# Patient Record
Sex: Male | Born: 1935
Health system: Southern US, Community
[De-identification: ages and names within clinical notes are randomized; demographics above are authoritative.]

## PROBLEM LIST (undated history)

## (undated) DIAGNOSIS — I351 Nonrheumatic aortic (valve) insufficiency: Secondary | ICD-10-CM

## (undated) DIAGNOSIS — I82409 Acute embolism and thrombosis of unspecified deep veins of unspecified lower extremity: Secondary | ICD-10-CM

## (undated) DIAGNOSIS — E785 Hyperlipidemia, unspecified: Secondary | ICD-10-CM

## (undated) DIAGNOSIS — C4490 Unspecified malignant neoplasm of skin, unspecified: Secondary | ICD-10-CM

## (undated) DIAGNOSIS — I340 Nonrheumatic mitral (valve) insufficiency: Secondary | ICD-10-CM

## (undated) DIAGNOSIS — K219 Gastro-esophageal reflux disease without esophagitis: Secondary | ICD-10-CM

## (undated) DIAGNOSIS — I5040 Unspecified combined systolic (congestive) and diastolic (congestive) heart failure: Secondary | ICD-10-CM

## (undated) DIAGNOSIS — E7800 Pure hypercholesterolemia, unspecified: Secondary | ICD-10-CM

## (undated) DIAGNOSIS — I2510 Atherosclerotic heart disease of native coronary artery without angina pectoris: Secondary | ICD-10-CM

## (undated) DIAGNOSIS — K819 Cholecystitis, unspecified: Secondary | ICD-10-CM

## (undated) HISTORY — DX: Gastro-esophageal reflux disease without esophagitis: K21.9

## (undated) HISTORY — DX: Unspecified combined systolic (congestive) and diastolic (congestive) heart failure: I50.40

## (undated) HISTORY — PX: SKIN CANCER EXCISION: SHX779

## (undated) HISTORY — DX: Pure hypercholesterolemia, unspecified: E78.00

## (undated) HISTORY — DX: Unspecified malignant neoplasm of skin, unspecified: C44.90

## (undated) HISTORY — DX: Nonrheumatic aortic (valve) insufficiency: I35.1

## (undated) HISTORY — DX: Nonrheumatic mitral (valve) insufficiency: I34.0

## (undated) HISTORY — DX: Acute embolism and thrombosis of unspecified deep veins of unspecified lower extremity: I82.409

## (undated) HISTORY — DX: Hyperlipidemia, unspecified: E78.5

## (undated) HISTORY — PX: TUMOR REMOVAL: SHX12

## (undated) HISTORY — DX: Atherosclerotic heart disease of native coronary artery without angina pectoris: I25.10

---

## 2000-02-16 DIAGNOSIS — I2510 Atherosclerotic heart disease of native coronary artery without angina pectoris: Secondary | ICD-10-CM

## 2000-02-16 HISTORY — DX: Atherosclerotic heart disease of native coronary artery without angina pectoris: I25.10

## 2000-12-19 ENCOUNTER — Inpatient Hospital Stay (HOSPITAL_COMMUNITY): Admission: RE | Admit: 2000-12-19 | Discharge: 2000-12-25 | Payer: Self-pay | Admitting: Cardiovascular Disease

## 2000-12-19 ENCOUNTER — Encounter: Payer: Self-pay | Admitting: Surgery

## 2000-12-21 ENCOUNTER — Encounter: Payer: Self-pay | Admitting: Surgery

## 2000-12-21 HISTORY — PX: CORONARY ARTERY BYPASS GRAFT: SHX141

## 2000-12-22 ENCOUNTER — Encounter: Payer: Self-pay | Admitting: Surgery

## 2000-12-23 ENCOUNTER — Encounter: Payer: Self-pay | Admitting: Surgery

## 2003-08-26 ENCOUNTER — Ambulatory Visit (HOSPITAL_COMMUNITY): Admission: RE | Admit: 2003-08-26 | Discharge: 2003-08-26 | Payer: Self-pay | Admitting: Internal Medicine

## 2008-05-10 ENCOUNTER — Encounter (INDEPENDENT_AMBULATORY_CARE_PROVIDER_SITE_OTHER): Payer: Self-pay | Admitting: *Deleted

## 2008-06-14 ENCOUNTER — Ambulatory Visit: Payer: Self-pay | Admitting: Gastroenterology

## 2008-06-27 ENCOUNTER — Ambulatory Visit: Payer: Self-pay | Admitting: Gastroenterology

## 2010-01-14 ENCOUNTER — Ambulatory Visit: Payer: Self-pay | Admitting: Cardiovascular Disease

## 2010-05-26 LAB — GLUCOSE, CAPILLARY
Glucose-Capillary: 107 mg/dL — ABNORMAL HIGH (ref 70–99)
Glucose-Capillary: 114 mg/dL — ABNORMAL HIGH (ref 70–99)

## 2010-06-16 ENCOUNTER — Other Ambulatory Visit: Payer: Self-pay | Admitting: *Deleted

## 2010-06-16 DIAGNOSIS — E7800 Pure hypercholesterolemia, unspecified: Secondary | ICD-10-CM

## 2010-07-03 NOTE — Consult Note (Signed)
[place]. [facility]  Patient:    Nathan [name], Nathan [name] Visit Number: [id number] MRN: [id number]          Service Type: SUR Location: 7LOA 7001 10 Attending Physician:  Valla Leaver Dictated by:   Gaye Pollack, M.D. Proc. Date: 12/19/00 Admit Date:  12/19/2000   CC:         Ramond Dial., M.D.  Ermalene Searing Philip Aspen, M.D.   Consultation Report  REFERRING PHYSICIAN:  Ramond Dial., M.D.  PRIMARY PHYSICIAN:  Ermalene Searing. Philip Aspen, M.D.  REASON FOR CONSULTATION:  High-grade left main and severe three-vessel coronary artery disease with positive stress test.  HISTORY OF PRESENT ILLNESS:  The patient is a 77 year old gentleman who recently presented to Dr. Greta Doom office for routine physical examination. He gave a history of recent onset of exertional dyspnea as well as mild substernal chest discomfort with heavy exertion.  These symptoms resolved quickly with rest.  He has also noticed a decreased energy level.  He underwent an electrocardiogram which reportedly showed some new changes.  He underwent an exercise Cardiolite test by Dr. Acie Fredrickson which revealed ST depression with exercise but normal Cardiolite images.  He subsequently underwent cardiac catheterization today which showed a 60 to 70% distal left main stenosis.  The LAD had proximal 60% long stenosis and a 70 to 80% mid stenosis after the first diagonal branch.  The left circumflex had 80 to 90% proximal stenosis.  The obtuse marginal had 90 to 95% stenosis.  The right coronary artery had 80% mid vessel stenosis.  There was 50 to 60% stenosis beyond the posterior descending branch before the posterolateral takeoff. Left ventricular function was normal.  REVIEW OF SYSTEMS:  Constitutional: He denies fever or chills.  He has had no recent weight change.  Eyes: Negative.  ENT: Negative.  Endocrine: He denies diabetes and hypothyroidism.  Cardiovascular: As above in History of  Present Illness.  He has had no peripheral edema.  He denies PND and orthopnea.  He has had no palpitations.  Respiratory: He denies cough or sputum production. GI: He has had no nausea or vomiting.  He denies melena and bright red blood per rectum.  There is no history of peptic ulcer disease.  GU: He denies dysuria and hematuria.  Neurological: He has had no focal weakness or numbness. He denies dizziness and syncope.  He had never had TIA or stroke. Musculoskeletal: Negative.  Psychiatric: Negative.  Allergies: None.  Skin: He has had multiple skin cancers removed.  PAST MEDICAL HISTORY:  He denies any history of diabetes, hypothyroidism, or hypertension.  He does have hypercholesterolemia.  PAST SURGICAL HISTORY:  He had a small tumor removed from his back that was benign and multiple skin cancers removed.  MEDICATIONS:  He is on a medication for hypercholesterolemia which he does not remember the name of.  FAMILY HISTORY:  Positive for coronary disease.  He had a sister who died in her 20s of heart disease, and his father died at approximately age 16 of heart disease.  SOCIAL HISTORY:  He is retired but still works part time.  He denies any history of tobacco or alcohol abuse.  He is divorced and has one daughter and lives alone.  PHYSICAL EXAMINATION:  VITAL SIGNS:  Blood pressure 146/84, heart rate 98 and regular.  Respiratory rate 16 and unlabored.  He is 6 feet tall and 175 pounds.  He is afebrile.  GENERAL:  He is a well-developed white  male in no distress.  HEENT:  Normocephalic and atraumatic.  Pupils are equal and reactive to light and accommodation.  Extraocular muscles are intact.  Throat is clear.  NECK:  Normal carotid pulses bilaterally.  There are no bruits.  There is no adenopathy or thyromegaly.  CARDIAC:  Regular rate and rhythm.  There is normal S1 and S2 with no murmur, rub, or gallop.  LUNGS:  Clear.  ABDOMEN:  Active bowel sounds.  Abdomen is  soft, flat, and nontender.  There are no palpable masses or organomegaly.  EXTREMITIES:  No peripheral edema.  Pedal pulses are palpable bilaterally.  SKIN:  Warm and dry.   There are multiple old scars on his face from removal of skin cancers.  NEUROLOGIC:  Alert and oriented x 3.  Motor and sensory exams are grossly normal.  LABORATORY DATA:  Carotid Doppler examination shows no evidence of internal carotid artery stenosis.  His ABI is greater than 1 bilaterally with triphasic wave forms.  IMPRESSION:  [name] has high-grade left main and severe three-vessel coronary artery disease with anginal symptoms and a positive stress test.  I agree that proceeding with coronary artery bypass graft surgery is the best treatment.  I discussed the operative procedure with the patient including alternatives, benefits, and risks including bleeding, possible blood transfusion, infection, stroke, myocardial infarction, and death.  He understands and agrees to proceed with surgery.  He will go home today, and we will plan to bring him back on Wednesday morning for surgery. Dictated by:   Gaye Pollack, M.D. Attending Physician:  Valla Leaver DD:  12/19/00 TD:  12/20/00 Job: [id number]

## 2010-07-03 NOTE — Cardiovascular Report (Signed)
[place]. [facility]  Patient:    Nathan [name], Nathan [name] Visit Number: [id number] MRN: [id number]          Service Type: SUR Location: 7LOA 7001 10 Attending Physician:  Valla Leaver Dictated by:   Ramond Dial., M.D. Proc. Date: 12/19/00 Admit Date:  12/19/2000   CC:         Ermalene Searing. Philip Aspen, M.D.  Cardiac Catheterization Laboratory   Cardiac Catheterization  INDICATIONS: The patient is a 77 year old gentleman with a recent onset of chest pain. He recently had stress Cardiolite study which revealed ST segment depression as well as chest pain during the exercise portion. The Cardiolite scan was negative for localized ischemia. Given his response during the exercise portion of the test he was thought to perhaps have three-vessel coronary artery disease and was referred for a catheterization.  DESCRIPTION OF PROCEDURE: The right femoral artery was easily cannulated using the modified Seldinger technique.  HEMODYNAMICS: There was no aortic valve gradient on pullback.  ANGIOGRAPHY: The left main coronary artery has a 60% stenosis in the distal segment. The proximal left main is normal.  The left anterior descending artery has a proximal irregular 60% stenosis. The stenosis is fairly long and encompasses the entire proximal LAD. There is a 70-80% stenosis in the mid LAD just after giving off the first diagonal branch. The distal left anterior descending is fairly unremarkable.  The first diagonal vessel is a fairly large vessel and only has minor luminal irregularities.  The left circumflex artery is a moderate sized vessel. There is a proximal 80-90% stenosis. The circumflex then gives off a moderate sized obtuse marginal artery, which has a 90-95% stenosis proximally. There is a stenosis in the circumflex artery just after giving of the first obtuse marginal artery of about 80%.  The right coronary artery is large and dominant. There is an  80% mid stenosis. The posterior descending artery is unremarkable. The posterolateral segment artery has a 50-60% stenosis at its mid point.  The left subclavian artery is unremarkable. The left internal mammary artery is normal. The left vertebral artery has a 30-40% stenosis in the proximal segment.  LEFT VENTRICULOGRAM: The left ventriculogram was performed in a 30 RAO position. It reveals no segmental wall motion abnormalities. There is a trace amount of catheter-induced mitral regurgitation.  COMPLICATIONS: None.  CONCLUSIONS: Three-vessel coronary artery disease. The fact that the patient had a positive treadmill, but a negative Cardiolite scan suggest that he had a matched defect. The catheterization confirms this theory. He will probably need coronary artery bypass grafting. He has well preserved left ventricular systolic function. Dictated by:   Ramond Dial., M.D. Attending Physician:  Valla Leaver DD:  12/19/00 TD:  12/20/00 Job: [id number]

## 2010-07-03 NOTE — Discharge Summary (Signed)
[place]. [facility]  Patient:    JADIEL, FARB Visit Number: [id number] MRN: [id number]          Service Type: SUR Location: 2000 2035 01 Attending Physician:  Valla Leaver Dictated by:   Argentina Donovan Glendell Docker, C.R.N.F.A. Admit Date:  12/19/2000 Discharge Date: 12/25/2000   CC:         Ermalene Searing. Philip Aspen, M.D.  Ramond Dial., M.D.   Discharge Summary  ADMITTING DIAGNOSIS:  Coronary artery disease with anginal symptoms and positive stress test.  PAST MEDICAL HISTORY:  Hypercholesterolemia.  ALLERGIES:  The patient has no known drug allergies.  DISCHARGE DIAGNOSES: 1. Severe three-vessel coronary artery disease, status post coronary artery    bypass grafting. 2. Diabetes mellitus, type 2.  BRIEF HISTORY:  Mr. Propson is a 77 year old Caucasian man who presented to his primary care physician with a history of dyspnea on exertion, mild substernal chest pain with heavy exertion, and a decreased energy level.  An EKG taken at that time revealed new changes.  An exercise Cardiolite stress test done by Dr. Acie Fredrickson in his office revealed ST segment depression, and normal images. He recommended cardiac catheterization which was performed on November 4. This revealed severe three-vessel coronary artery disease including high grade left main stenosis.  Cardiac surgery consult was obtained with Dr. Gilford Raid.  After examination of the patient and review of the records, including catheterization films, he recommended coronary artery bypass grafting per treatment choice of this gentleman.  The procedure and risks and benefits were explained to the patient and he agreed to proceed.  Plans were made for him to return to the hospital on November 6.  HOSPITAL COURSE:  On December 21, 2000, Mr. Crisp was electively admitted to [facility] under the care of Dr. Gaye Pollack.  He underwent an uncomplicated coronary artery bypass grafting x5.   Grafts placed at the time of procedure were left internal mammary artery grafted to the left anterior descending artery, saphenous vein was grafted to the diagonal artery, saphenous vein was grafted to the first obtuse marginal, saphenous vein was grafted in a sequential fashion to the posterior descending and posterior lateral arteries.  Vein harvest vein for the bypass graft was harvested from bilateral lower extremities.  He tolerated the procedure well and was transferred in stable condition to the SICU.  Mr. Eimers remained hemodynamically stable throughout his postoperative course. He was noted to be hyperglycemic during this period and hemoglobin A1c taken did measure 6.4.  Mr. Hoofnagle made very good progress in recovering from his surgery and he was discharged home on December 25, 2000.  CONDITION ON DISCHARGE:  Improved.  INSTRUCTIONS ON DISCHARGE:  Medications, activity, diet, wound care, and followup appointments.  Please see the discharge instruction sheet for details.  MEDICATIONS ON DISCHARGE: 1. Tylox 1-2 p.o. q.4-6h. p.r.n. for pain. 2. Toprol XL 25 mg p.o. q.d.  He has been instructed to resume his home medications of enteric coated aspirin 325 mg p.o. q.d. and Lipitor at his home dose.  FOLLOWUP:  He has been asked to get an appointment to see Dr. Acie Fredrickson in his office in approximately two weeks.  He also has an appointment to see Dr. Cyndia Bent at the [place] office on November 26, at 9:30 in the morning, and he has been asked to get a chest x-ray at [facility] at 8:30 that morning. Dictated by:   Argentina Donovan. Glendell Docker, C.R.N.F.A. Attending Physician:  Valla Leaver DD:  02/16/01 TD:  02/16/01 Job: [id number]

## 2010-07-03 NOTE — Op Note (Signed)
[place]. [facility]  Patient:    GENNARO, SHANAFELT Visit Number: [id number] MRN: [id number]          Service Type: SUR Location: 2000 2035 01 Attending Physician:  Valla Leaver Proc. Date: 12/21/00 Admit Date:  12/19/2000   CC:         CVTS Office  Ramond Dial., M.D.  Cardiac Cath Lab   Operative Report  PREOPERATIVE DIAGNOSIS:  Left main and severe three-vessel coronary artery disease.  POSTOPERATIVE DIAGNOSIS:  Left main and severe three-vessel coronary artery disease.  OPERATIONS PERFORMED: 1. Median sternotomy. 2. Extracorporeal circulation. 3. Coronary artery bypass graft surgery times five using the left internal    mammary artery graft to the left anterior descending coronary artery with    a saphenous vein graft to the diagonal branch of the left anterior    descending, a sequential saphenous vein graft to the posterior descending    and posterolateral branch of the right coronary artery, and a saphenous    vein graft to the obtuse marginal branch of the left circumflex coronary    artery.  SURGEON:  Gaye Pollack, M.D.  ASSISTANT:  Lynnell Catalan, P.A.  ANESTHESIA:  General endotracheal.  CLINICAL HISTORY:  This patient is a 77 year old gentleman who presented with recent onset exertional dyspnea and mild chest discomfort.  He was noted to have an abnormal electrocardiogram.  An exercise stress test showed an ST depression, but normal Cardiolite images.  Cardiac catheterization showed 60-70% distal left main stenosis.  The LAD had proximal 60% long stenosis and 70-80% mid stenosis after the first diagonal branch.  The diagonal branch was a large vessel.  The left circumflex had an 80-90% proximal stenosis and a 90-95% obtuse marginal stenosis.  There was an 80% left circumflex stenosis after the first marginal branch.  The distal portion of the vessel was relatively small.  The right coronary artery had 80% mid vessel  stenosis. There was a 50-60% stenosis after the takeoff of the posterior descending branch compromising the posterolateral branch.  Left ventricular function was normal.  After review of the angiograms and examination of the patient it was felt that coronary artery bypass graft surgery was the best treatment.  I discussed the operative procedure with the patient including alternatives, benefits and risks including bleeding, possible blood transfusion, infection, stroke, myocardial infarction, and death.  He understood and agreed to proceed.  OPERATIVE PROCEDURE:  The patient was brought to the operating room and placed on the table in the supine position.  After induction of general endotracheal anesthesia a Foley catheter was placed in the bladder using sterile technique.  Then the chest, abdomen and both lower extremities were prepped and draped in the usual sterile manner.  The chest was entered through a median sternotomy incision and the pericardium opened in the midline.  Examination of the heart showed good ventricular contractility.  The ascending aorta had no palpable plaques in it.  Then the left internal mammary artery was harvested from the chest as a pedicle graft.  This was a medium caliber vessel with excellent blood flow through it.  At the same time a segment of greater saphenous vein was harvested from the right lower leg and this vein was of medium size and good quality.  However, after approximately one scissor length of the vein it divided into two small branches and therefore another section of saphenous vein was harvested from the left lower leg.  This vein was  of medium caliber and good quality.  Then the patient was heparinized.  When an adequate clotting time was achieved the distal ascending aorta was cannulated using a 20 French aortic cannula for arterial inflow.  Venous outflow was achieved using a two-stage venous cannula for the right atrial appendage.   An antegrade cardioplegia and vent cannulae were inserted in the aortic root.  Patient was placed on cardiopulmonary bypass and the distal coronaries identified.  The LAD was a large graftable vessel with mild distal disease in it.  The diagonal branch was a large vessel with proximal and midportion disease.  The obtuse marginal was a large graftable vessel that was heavily diseased proximally.  The right coronary artery gave off a large posterior descending branch that had no significant disease in it.  The posterolateral branch was small, but felt to be graftable.  There was heavy disease between the posterior descending and posterolateral branch.  Then the aorta was crossclamped and 500 cc of cold blood antegrade cardioplegia was administered in the aortic root with quick arrest of the heart.  Systemic hypothermia to 20 degrees centigrade and topical hypothermia with iced saline was used.  A temperature probe was placed the septum and inflated patent pericardium.  The first distal anastomosis was performed to the obtuse marginal branch.  The internal diameter was about 2 mm.  Conduit used was a segment of greater saphenous vein.  The anastomosis was performed in an end-to-side manner using continuous 7-0 Prolene suture.  Flow was noted through the graft and it was excellent.  Then the second distal anastomosis was performed to the diagonal branch.  The internal diameter of this vessel was 1.75 mm.  The conduit used was a second segment of the greater saphenous vein.  Anastomosis was performed in an end-to-side manner using continued 7-0 Prolene suture.  Flow was noted through the graft and was excellent.  Then a dose of cardioplegia was given on the veins grafts and in the aortic root.  The third distal anastomosis was performed to the posterior descending branch.   The internal diameter was 1.75 mm.  The conduit used was a third segment of the greater saphenous vein.  The  anastomosis was performed in a sequential side-to-side manner using continuous 7-0 Prolene suture.  Flow was noted through the graft and was excellent.  The fourth distal anastomosis was performed to the posterolateral branch.  The internal diameter of this vessel was about 1.4 mm.  The conduit used was the same segment of the greater saphenous vein.  Anastomosis was performed in a sequential end-to-side manner using continuous 8-0 Prolene suture.  Flow was noticed in the graft and was excellent.  Then another dose of cardioplegia was given down the vein grafts and in the aortic root.  The fifth distal anastomosis was performed to the midportion of the left anterior descending coronary artery.  The internal diameter was greater than 2.5 mm.  The conduit used was a left internal mammary graft and this was brought through an opening in the left pericardium and through the phrenic nerve.  It was anastomosed to the LAD in an end-to-side manner using continuous 8-0 Prolene suture.  The pedicle was tacked to the epicardium with 6-0 Prolene sutures.  The patient was rewarmed to 37 degrees centigrade and the clamp removed from the main right pedicle.  There was rapid warming in the ventricular septum and return of spontaneous ventricular fibrillation.  The crossclamp was removed with a time of 78  minutes and the patient spontaneously converted to sinus rhythm.  A partial occlusion clamp was placed in the aortic root and three proximal vein graft anastomoses were performed in an end-to-side manner using continuous 6-0 Prolene suture.  The clamp was removed.  The vein grafts deaired and clots removed from them.  The proximal and distal anastomoses appeared hemostatic along the graft was satisfactory.  Graft mark was placed around the proximal anastomosis.  Two temporary right ventricular and right atrial pacing wires were placed and brought out through the skin.  When the patient was rewarmed  to 37 degrees centigrade he was weaned from cardiopulmonary bypass on no inotropic agents.  Total bypass time was 133 minutes.  Cardiac function appeared excellent with a cardiac output of 6 liters a minute.  Protamine was given and the venous and aortic cannulae removed without difficulty.  Hemostasis was achieved.  Three chest tubes were placed with two in the posterior pericardium, one in the left pleural space and one in the anterior mediastinum.  The pericardium was reapproximated to the heart.  The sternum was closed with #6 stainless steel wires.  The fascia was closed with a continuous #1 Vicryl suture.  Subcutaneous tissue was closed with a continuous 2-0 Vicryl and the skin with 3-0 Vicryl subcuticular closure.  Lower extremity vein harvest site was closed in layers in a similar manner.  The sponge, needle and instrument counts were correct according to the scrub nurse.  Dry sterile dressings were applied over the incisions, around the chest tubes, which had Pleur-Evac suction.  The patient remained hemodynamically stable and was transported to the SICU in guarded, but stable condition. Attending Physician:  Valla Leaver DD:  12/21/00 TD:  12/22/00 Job: [id number]

## 2010-07-03 NOTE — H&P (Signed)
[place]. [facility]  Patient:    Nathan [name], Nathan [name] Visit Number: [id number] MRN: [id number]          Service Type: Attending:  Ramond Dial., M.D. Dictated by:   Ramond Dial., M.D. Adm. Date:  12/16/00   CC:         Ermalene Searing. Philip Aspen, M.D.   History and Physical  HISTORY OF PRESENT ILLNESS:  Nathan [name] is a 77 year old gentleman with a history of chest pain and chest tightness.  He was originally referred to the office for a stress Cardiolite study.  He was found to have an abnormal Cardiolite study and was scheduled for a heart catheterization.  Nathan [name] is relatively healthy.  He has had episodes of chest pain for the past several months.  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  These episodes of chest pain and chest tightness occur with minimal exercise.  They typically resolve when he stops and rests.  He has significant chest tightness and severe shortness of breath.  CURRENT MEDICATIONS:  Aspirin once a day and multivitamin once a day.  ALLERGIES:  No known drug allergies.  PAST MEDICAL HISTORY:  Hypercholesterolemia.  SOCIAL HISTORY:  The patient works as a Psychologist, sport and exercise.  He does not smoke or drink alcohol.  He drinks one or two cups of coffee a day.  FAMILY HISTORY:  His father died at age 26 due to a myocardial infarction. His mother died at age 76 due to unknown causes.  REVIEW OF SYSTEMS:  He denies any heat or cold intolerance, or weight gain or weight loss.  He denies any problems with his eyes, ears, nose, and throat. He denies any cough or sputum production.  He does have shortness of breath and some chest tightness as noted above.  He denies any palpitations, syncope, or presyncope.  He denies any change in his bowel habits or dysuria or hematuria.  He denies any rash or skin nodules.  He denies any easy bruisability or depression.  His review of systems was reviewed  and is otherwise negative.  PHYSICAL EXAMINATION:  GENERAL:  He is a middle-aged gentleman in no acute distress.  He is alert and oriented x3 and his mood and affect are normal.  VITAL SIGNS:  Heart rate 70, blood pressure 120/74.  HEENT:  Reveals 2+ carotids.  He has no bruits.  There is no JVD.  LUNGS:  Clear to auscultation.  HEART:  Regular rate, S1, S2 with no murmurs, gallops, or rubs.  ABDOMEN:  Reveals good bowel sounds and is nontender.  EXTREMITIES:  He has no clubbing, cyanosis, or edema and his pulses are intact.  NEUROLOGIC:  Nonfocal with cranial nerves II-XII intact and his motor and sensory function are intact.  His gait is normal.  His skin is warm and dry.  LABORATORY DATA:  EKG reveals normal sinus rhythm.  He has nonspecific ST and T wave changes.  ASSESSMENT AND PLAN:  Mr. Umemoto presents with episodes of chest pain and an abnormal Cardiolite study with moderately reduced left ventricular systolic function.  We will admit him for heart catheterization.  We have discussed the risks, benefits, and options concerning heart catheterization.  He understands and agrees to proceed. Dictated by:   Ramond Dial., M.D. Attending:  Ramond Dial., M.D. DD:  05/23/01 TD:  05/23/01 Job: [id number]

## 2010-07-15 ENCOUNTER — Encounter: Payer: Self-pay | Admitting: *Deleted

## 2010-07-15 DIAGNOSIS — E7800 Pure hypercholesterolemia, unspecified: Secondary | ICD-10-CM | POA: Insufficient documentation

## 2010-07-15 DIAGNOSIS — I340 Nonrheumatic mitral (valve) insufficiency: Secondary | ICD-10-CM | POA: Insufficient documentation

## 2010-07-15 DIAGNOSIS — I351 Nonrheumatic aortic (valve) insufficiency: Secondary | ICD-10-CM | POA: Insufficient documentation

## 2010-07-15 DIAGNOSIS — I2510 Atherosclerotic heart disease of native coronary artery without angina pectoris: Secondary | ICD-10-CM | POA: Insufficient documentation

## 2010-07-16 ENCOUNTER — Ambulatory Visit (INDEPENDENT_AMBULATORY_CARE_PROVIDER_SITE_OTHER): Payer: Medicare Other | Admitting: Cardiovascular Disease

## 2010-07-16 ENCOUNTER — Other Ambulatory Visit (INDEPENDENT_AMBULATORY_CARE_PROVIDER_SITE_OTHER): Payer: Medicare Other | Admitting: *Deleted

## 2010-07-16 ENCOUNTER — Encounter: Payer: Self-pay | Admitting: Cardiovascular Disease

## 2010-07-16 VITALS — BP 152/68 | HR 60 | Wt 178.0 lb

## 2010-07-16 DIAGNOSIS — I2510 Atherosclerotic heart disease of native coronary artery without angina pectoris: Secondary | ICD-10-CM

## 2010-07-16 DIAGNOSIS — E7800 Pure hypercholesterolemia, unspecified: Secondary | ICD-10-CM

## 2010-07-16 LAB — BASIC METABOLIC PANEL
Calcium: 9.8 mg/dL (ref 8.4–10.5)
Creatinine, Ser: 1.2 mg/dL (ref 0.4–1.5)

## 2010-07-16 LAB — HEPATIC FUNCTION PANEL
ALT: 24 U/L (ref 0–53)
AST: 24 U/L (ref 0–37)
Albumin: 4 g/dL (ref 3.5–5.2)
Alkaline Phosphatase: 50 U/L (ref 39–117)
Total Protein: 7 g/dL (ref 6.0–8.3)

## 2010-07-16 LAB — LIPID PANEL
Cholesterol: 142 mg/dL (ref 0–200)
Triglycerides: 174 mg/dL — ABNORMAL HIGH (ref 0.0–149.0)

## 2010-07-16 NOTE — Progress Notes (Signed)
Nathan [name] Date of Birth  June 07, 1933 [facility] / [facility] [id number] [address]     [place], [place]  [address] [phone]  Fax  [phone]  History of Present Illness:  No major complaints.  BP is a bit high today.  Ate some ham over the weekend.  BP has been normal on other recent MD visits.  Current Outpatient Prescriptions on File Prior to Visit  Medication Sig Dispense Refill  . aspirin 81 MG tablet Take 81 mg by mouth daily.        [name] atenolol (TENORMIN) 50 MG tablet Take 50 mg by mouth daily.        [name] glipiZIDE (GLUCOTROL) 10 MG tablet Take 10 mg by mouth 2 (two) times daily before a meal.        . lisinopril (PRINIVIL,ZESTRIL) 2.5 MG tablet Take 2.5 mg by mouth daily.        . metFORMIN (GLUCOPHAGE) 500 MG tablet Take 1,500 mg by mouth 2 (two) times daily with a meal.        . multivitamin (THERAGRAN) per tablet Take 1 tablet by mouth daily.        . Omega-3 Fatty Acids (FISH OIL) 1200 MG CAPS Take 1 capsule by mouth daily.        . simvastatin (ZOCOR) 20 MG tablet Take 20 mg by mouth at bedtime.        [name] DISCONTD: omeprazole (PRILOSEC OTC) 20 MG tablet Take 20 mg by mouth daily.          No Known Allergies  Past Medical History  Diagnosis Date  . Hyperlipidemia   . Coronary artery disease     CABG x5  . Diabetes mellitus   . Hypercholesteremia   . Mitral valve regurgitation   . Aortic insufficiency   . Skin cancer     tumor removed off of his back  . Acid reflux     Past Surgical History  Procedure Date  . Coronary artery bypass graft 12/21/2000    x5  . Tumor removal     off of back, skin cancer    History  Smoking status  . Never Smoker   Smokeless tobacco  . Not on file    History  Alcohol Use No    Family History  Problem Relation Age of Onset  . Heart attack Father   . Hypertension Mother   . Diabetes Brother   . Cancer Sister     breast  . Cancer Brother     throat & mouth    Reviw of Systems:    Reviewed in the HPI.  All other systems are negative.  Physical Exam: BP 152/68  Pulse 60  Wt 178 lb (80.74 kg) The patient is alert and oriented x 3.  The mood and affect are normal.  The skin is warm and dry.  Color is normal.  The HEENT exam reveals that the sclera are nonicteric.  The mucous membranes are moist.  The carotids are 2+ without bruits.  There is no thyromegaly.  There is no JVD.  The lungs are clear.  The chest wall is non tender.  The heart exam reveals a regular rate with a normal S1 and S2.  There is a soft systolic murmur.  The PMI is not displaced.   Abdominal exam reveals good bowel sounds.  There is no guarding or rebound.  There is no hepatosplenomegaly or tenderness.  There are no masses.  Exam of the legs reveal no clubbing, cyanosis, or edema.  The legs are without rashes.  The distal pulses are intact.  Cranial nerves II - XII are intact.  Motor and sensory functions are intact.  The gait is normal.  Assessment / Plan:

## 2010-07-16 NOTE — Assessment & Plan Note (Signed)
Nathan [name] is doing very well from a cardiac standpoint. I'm glad that he's not had any further episodes of chest pain.

## 2010-07-16 NOTE — Assessment & Plan Note (Signed)
We have checked a lipid profile today. I'll see him again in 6 months for office visit, lipid profile, hepatic profile, and basic metabolic profile.

## 2010-07-20 NOTE — Progress Notes (Signed)
Normal lab result msg left

## 2010-07-27 DIAGNOSIS — M79609 Pain in unspecified limb: Secondary | ICD-10-CM

## 2010-08-26 ENCOUNTER — Inpatient Hospital Stay (HOSPITAL_COMMUNITY)
Admission: AD | Admit: 2010-08-26 | Discharge: 2010-08-31 | DRG: 301 | Disposition: A | Discharge status: Home / Self Care | Payer: Medicare Other | Source: Ambulatory Visit | Attending: Internal Medicine | Admitting: Internal Medicine

## 2010-08-26 DIAGNOSIS — N529 Male erectile dysfunction, unspecified: Secondary | ICD-10-CM | POA: Diagnosis present

## 2010-08-26 DIAGNOSIS — I824Z9 Acute embolism and thrombosis of unspecified deep veins of unspecified distal lower extremity: Principal | ICD-10-CM | POA: Diagnosis present

## 2010-08-26 DIAGNOSIS — Z951 Presence of aortocoronary bypass graft: Secondary | ICD-10-CM

## 2010-08-26 DIAGNOSIS — I2510 Atherosclerotic heart disease of native coronary artery without angina pectoris: Secondary | ICD-10-CM | POA: Diagnosis present

## 2010-08-26 DIAGNOSIS — Z860100 Personal history of colon polyps, unspecified: Secondary | ICD-10-CM

## 2010-08-26 DIAGNOSIS — E785 Hyperlipidemia, unspecified: Secondary | ICD-10-CM | POA: Diagnosis present

## 2010-08-26 DIAGNOSIS — I080 Rheumatic disorders of both mitral and aortic valves: Secondary | ICD-10-CM | POA: Diagnosis present

## 2010-08-26 DIAGNOSIS — I10 Essential (primary) hypertension: Secondary | ICD-10-CM | POA: Diagnosis present

## 2010-08-26 DIAGNOSIS — Z7901 Long term (current) use of anticoagulants: Secondary | ICD-10-CM

## 2010-08-26 DIAGNOSIS — E119 Type 2 diabetes mellitus without complications: Secondary | ICD-10-CM | POA: Diagnosis present

## 2010-08-26 DIAGNOSIS — K219 Gastro-esophageal reflux disease without esophagitis: Secondary | ICD-10-CM | POA: Diagnosis present

## 2010-08-26 DIAGNOSIS — Z7982 Long term (current) use of aspirin: Secondary | ICD-10-CM

## 2010-08-26 DIAGNOSIS — M1710 Unilateral primary osteoarthritis, unspecified knee: Secondary | ICD-10-CM | POA: Diagnosis present

## 2010-08-26 LAB — COMPREHENSIVE METABOLIC PANEL
ALT: 13 U/L (ref 0–53)
AST: 8 U/L (ref 0–37)
Albumin: 3.7 g/dL (ref 3.5–5.2)
CO2: 23 mEq/L (ref 19–32)
Calcium: 9.2 mg/dL (ref 8.4–10.5)
GFR calc non Af Amer: 58 mL/min — ABNORMAL LOW (ref >60)
Sodium: 140 mEq/L (ref 135–145)
Total Protein: 7.1 g/dL (ref 6.0–8.3)

## 2010-08-26 LAB — PROTIME-INR: Prothrombin Time: 14.2 seconds (ref 11.6–15.2)

## 2010-08-26 LAB — CBC
MCH: 31.2 pg (ref 26.0–34.0)
Platelets: 220 10*3/uL (ref 150–400)
RBC: 4.77 MIL/uL (ref 4.22–5.81)
RDW: 12.6 % (ref 11.5–15.5)
WBC: 12.2 10*3/uL — ABNORMAL HIGH (ref 4.0–10.5)

## 2010-08-27 LAB — CBC
HCT: 36.1 % — ABNORMAL LOW (ref 39.0–52.0)
Hemoglobin: 13.2 g/dL (ref 13.0–17.0)
MCH: 31 pg (ref 26.0–34.0)
MCV: 84.7 fL (ref 78.0–100.0)
RBC: 4.26 MIL/uL (ref 4.22–5.81)
WBC: 8.7 10*3/uL (ref 4.0–10.5)

## 2010-08-27 LAB — HEPARIN LEVEL (UNFRACTIONATED)
Heparin Unfractionated: 0.45 IU/mL (ref 0.30–0.70)
Heparin Unfractionated: 0.5 IU/mL (ref 0.30–0.70)

## 2010-08-27 LAB — PROTIME-INR: INR: 1.2 (ref 0.00–1.49)

## 2010-08-28 ENCOUNTER — Inpatient Hospital Stay (HOSPITAL_COMMUNITY): Payer: Medicare Other

## 2010-08-28 LAB — PROTIME-INR
INR: 1.46 (ref 0.00–1.49)
Prothrombin Time: 18 seconds — ABNORMAL HIGH (ref 11.6–15.2)

## 2010-08-28 LAB — CBC
Hemoglobin: 14.3 g/dL (ref 13.0–17.0)
Platelets: 191 10*3/uL (ref 150–400)
RBC: 4.7 MIL/uL (ref 4.22–5.81)
WBC: 9.4 10*3/uL (ref 4.0–10.5)

## 2010-08-28 MED ORDER — IOHEXOL 300 MG/ML  SOLN
80.0000 mL | Freq: Once | INTRAMUSCULAR | Status: AC | PRN
  Administered 2010-08-28: 80 mL via INTRAVENOUS

## 2010-08-29 LAB — CBC
MCH: 30.2 pg (ref 26.0–34.0)
MCV: 85 fL (ref 78.0–100.0)
Platelets: 176 10*3/uL (ref 150–400)
RBC: 4.6 MIL/uL (ref 4.22–5.81)
RDW: 12.8 % (ref 11.5–15.5)
WBC: 9.4 10*3/uL (ref 4.0–10.5)

## 2010-08-29 LAB — PROTIME-INR: INR: 2.15 — ABNORMAL HIGH (ref 0.00–1.49)

## 2010-08-29 LAB — HEPARIN LEVEL (UNFRACTIONATED): Heparin Unfractionated: 0.64 IU/mL (ref 0.30–0.70)

## 2010-08-30 LAB — CBC
HCT: 37.7 % — ABNORMAL LOW (ref 39.0–52.0)
MCHC: 36.1 g/dL — ABNORMAL HIGH (ref 30.0–36.0)
MCV: 85.3 fL (ref 78.0–100.0)
Platelets: 174 10*3/uL (ref 150–400)
RDW: 12.7 % (ref 11.5–15.5)
WBC: 8.7 10*3/uL (ref 4.0–10.5)

## 2010-08-30 LAB — PSA: PSA: 1.27 ng/mL (ref <=4.00)

## 2010-08-30 LAB — BASIC METABOLIC PANEL
BUN: 17 mg/dL (ref 6–23)
CO2: 24 mEq/L (ref 19–32)
Calcium: 8.6 mg/dL (ref 8.4–10.5)
Chloride: 107 mEq/L (ref 96–112)
Creatinine, Ser: 1.32 mg/dL (ref 0.50–1.35)

## 2010-08-30 LAB — PROTIME-INR: INR: 2.6 — ABNORMAL HIGH (ref 0.00–1.49)

## 2010-08-31 LAB — CBC
HCT: 39.2 % (ref 39.0–52.0)
Hemoglobin: 13.9 g/dL (ref 13.0–17.0)
MCV: 86.2 fL (ref 78.0–100.0)
RBC: 4.55 MIL/uL (ref 4.22–5.81)
WBC: 11.2 10*3/uL — ABNORMAL HIGH (ref 4.0–10.5)

## 2010-08-31 LAB — GLUCOSE, CAPILLARY: Glucose-Capillary: 135 mg/dL — ABNORMAL HIGH (ref 70–99)

## 2010-08-31 LAB — PROTIME-INR
INR: 2.63 — ABNORMAL HIGH (ref 0.00–1.49)
Prothrombin Time: 28.5 seconds — ABNORMAL HIGH (ref 11.6–15.2)

## 2010-09-30 NOTE — Discharge Summary (Signed)
NAMEEMILY, KRELLER NO.:  [id number]  MEDICAL RECORD NO.:  [id number]  LOCATION:  5118                         FACILITY:  [facility]  PHYSICIAN:  Ermalene Searing. Philip Aspen, M.D.DATE OF BIRTH:  08-23-1933  DATE OF ADMISSION:  08/26/2010 DATE OF DISCHARGE:  08/31/2010                              DISCHARGE SUMMARY   CHIEF COMPLAINT:  Right thigh lump.  HISTORY OF PRESENT ILLNESS:  The patient is a 77 year old Caucasian man who about 2 weeks ago felt like he may have been stung on the right inner mid thigh.  The area was a bit sore.  He was seen by our nurse practitioner who started him on doxycycline and prednisone.  However, with this, there was not much change in his symptoms.  In fact, he had an increase in symptoms and tenderness in this area.  This was not associated with fever, chills, or shortness of breath.  He has not had any injury to the area and has not been on recent long car trips or airplane trips.  He has no personal history of thrombosis or a family history of hypercoagulation.  PAST MEDICAL HISTORY: 1. Moderate aortic insufficiency with last echocardiogram done in     March 2011, mild mitral regurgitation, mild tricuspid     regurgitation. 2. Coronary artery disease status post 2002 CABG surgery. 3. 2003 colonoscopy showed hyperplastic polyps. 4  Hypertension. 1. Diabetes mellitus, type 2. 2. Osteoarthritis. 3. Gastroesophageal reflux disease. 4. Erectile dysfunction, organic. 5. Hyperlipidemia.  MEDICATIONS AT THE TIME OF HOSPITALIZATION: 1. Aspirin 81 mg p.o. daily. 2. Glucophage extended release 500 mg, take 2 tablets p.o. twice     daily. 3. Atenolol 50 mg p.o. twice daily. 4. Cialis 20 mg p.o. daily p.r.n. 5. Prilosec 20 mg p.o. daily. 6. Glucotrol XL 10 mg p.o. twice daily. 7. Multivitamin 1 tablet p.o. daily. 8. Fish oil 1000 mg, take 2 capsules p.o. daily. 9. Amlodipine 5 mg p.o. daily. 10.Simvastatin 20 mg p.o.  daily. 11.Lisinopril 5 mg p.o. daily. 12.Meloxicam 15 mg p.o. daily p.r.n. knee pain. 13.Status post prednisone 10 mg twice daily for 3 days. 14.Status post doxycycline 100 mg p.o. twice daily.  ALLERGIES:  No known drug allergies.  PAST SURGICAL HISTORY: 1. Remote back lipoma excision. 2. Multiple basal cell carcinoma excisions. 3. November 2002, coronary artery bypass graft surgery x5. 4. March 2006, Mohs surgery on nose for basal cell carcinoma.  FAMILY HISTORY:  His father died at age 38 from myocardial infarction. His mother died at age 22.  A sister died at age 72 years from myocardial infarction and another sister had breast cancer.  A grandparent had diabetes mellitus, type 2.  There is a family history of premature coronary artery disease, breast cancer, and diabetes mellitus. He has one daughter, one grandson, and one granddaughter.  SOCIAL HISTORY:  He is divorced.  He is retired from work as a Psychologist, sport and exercise. He has no history of tobacco or alcohol abuse.  He enjoys playing golf in his free time.  REVIEW OF SYSTEMS:  He has not had recent fever or chills or weight loss.  He denies chest pain, palpitations, dyspnea on exertion, orthopnea, or  peripheral edema.  He denies shortness of breath or cough. He denies nausea, vomiting, diarrhea, constipation, or rectal bleeding. He denies difficulty voiding or significant nocturia.  He has mild erectile dysfunction improved with Cialis.  He has occasional mild knee pain and mild low back pain that improves with Mobic.  He denies numbness or tingling or anxiety or depression.  INITIAL PHYSICAL EXAMINATION:  VITAL SIGNS:  Blood pressure 134/72, pulse 84, respirations 20, temperature 98.1, weight is 178.4 pounds, and height of 71.75 inches. GENERAL:  The patient is a well-nourished, well-developed Caucasian man who is in no apparent distress. HEAD, EYES, EARS, NOSE, AND THROAT:  Within normal limits. NECK:  Supple without jugular  venous distention or carotid bruit. CHEST:  Clear to auscultation. HEART:  Regular rate and rhythm.  S1-S2 were present and there was a grade 2/6 diastolic decrescendo murmur consistent with aortic insufficiency.  He did not have a carotid bruit and the pedal pulses were normal.  There was no peripheral edema. ABDOMEN:  He had normal bowel sounds and no hepatosplenomegaly or tenderness. MUSCULOSKELETAL:  Normal. NEUROLOGIC:  No focal deficits. EXTREMITIES:  Significant for prominent varicose veins on both legs.  In the mid medial right thigh, he has prominent noncompressible venous cords extending throughout the expected course of the greater saphenous vein.  HOSPITAL COURSE:  The patient was admitted to a medical bed without telemetry.  On the day of admission, he had a right lower extremity DVT ultrasound exam.  The findings were consistent with an acute deep vein thrombosis involving the right lower extremity and extending as far proximally as the right common femoral vein.  He was started on IV heparin for this and Coumadin was added with pharmacist's assistance with dosing.  He had a PSA level checked which was normal.  He had a chest x-ray done that showed minimal left base scarring but no acute cardiopulmonary disease.  He also had a CT scan of the abdomen and pelvis done with oral and IV contrast.  This study showed bilateral peripelvic renal cysts, prostate enlargement, mild fatty infiltration of the liver, grade II spondylolisthesis of L5 on S1, and no evidence of abnormal lymphadenopathy or neoplasm.  COMPLICATIONS:  None.  CONDITION ON DISCHARGE:  He had no shortness of breath or unusual bleeding.  He had no chest discomfort.  He had been eating well.  RECENT PHYSICAL EXAMINATION:  VITAL SIGNS:  Blood pressure 156/97, pulse 63, respirations 17, temperature 97.5, and pulse oxygen saturation was 95% on room air. GENERAL:  He is a well-nourished, well-developed white man  who is in no apparent distress. CHEST:  Clear to auscultation. HEART:  He had a regular rate and rhythm with a grade 2/6 diastolic decrescendo murmur consistent with aortic insufficiency. ABDOMEN:  He had normal bowel sounds and no tenderness. EXTREMITIES:  Without edema.  There is less prominence of the noncompressible veins in the right thigh.  MOST RECENT AND PERTINENT LABORATORY STUDIES:  INR level was 2.63. White blood cell count was 11.2, hemoglobin 13.9, hematocrit 39.2, and platelets 178.  Serum sodium 140, potassium 3.7, chloride 107, carbon dioxide 24, BUN 17, creatinine 1.32, and glucose 154.  PSA was 1.27.  DISCHARGE DIAGNOSES: 1. Acute right lower extremity deep venous thrombosis. 2. Hypertension, essential, controlled. 3. Diabetes mellitus, type 2, controlled. 4. Aortic insufficiency, moderate. 5. Gastroesophageal reflux disease. 6. Hyperlipidemia. 7. Benign prostatic hypertrophy. 8. Osteoarthritis of the knee.  DISCHARGE MEDICATIONS: 1. Coumadin 5 mg, take 1/2 tablet by mouth every  Monday, Wednesday,     and Friday and a full tablet by mouth every Tuesday, Thursday,     Saturday, and Sunday. 2. Amlodipine 5 mg p.o. daily. 3. Atenolol 50 mg p.o. daily. 4. Glipizide extended release 10 mg p.o. twice daily. 5. Metformin extended release 500 mg, take 1 tablet in the morning and     2 tablets in the evenings. 6. Lisinopril 2.5 mg p.o. daily. 7. Multivitamin 1 tablet p.o. daily. 8. Omeprazole 20 mg p.o. daily. 9. Simvastatin 40 mg p.o. daily.  DISPOSITION AND FOLLOWUP:  The patient was discharged to home.  He was advised to schedule a followup appointment with Dr. Leanna Battles at [facility] within the next 3-5 days and he was given the telephone number [phone] to schedule that appointment.  He was advised to call us as soon as possible should he have any increase in symptoms in the interim.  He was advised to not play golf for 1 week following  discharge.         ______________________________ Ermalene Searing. Philip Aspen, M.D.    DGP/MEDQ  D:  09/01/2010  T:  09/01/2010  Job:  [id number]  Electronically Signed by Leanna Battles M.D. on 09/30/2010 08:42:18 AM

## 2011-01-21 ENCOUNTER — Ambulatory Visit (INDEPENDENT_AMBULATORY_CARE_PROVIDER_SITE_OTHER): Payer: Medicare Other | Admitting: Cardiovascular Disease

## 2011-01-21 ENCOUNTER — Encounter: Payer: Self-pay | Admitting: Cardiovascular Disease

## 2011-01-21 DIAGNOSIS — E785 Hyperlipidemia, unspecified: Secondary | ICD-10-CM

## 2011-01-21 DIAGNOSIS — R55 Syncope and collapse: Secondary | ICD-10-CM

## 2011-01-21 LAB — BASIC METABOLIC PANEL
BUN: 19 mg/dL (ref 6–23)
CO2: 25 mEq/L (ref 19–32)
Chloride: 102 mEq/L (ref 96–112)
Potassium: 4.3 mEq/L (ref 3.5–5.1)

## 2011-01-21 LAB — LIPID PANEL
Cholesterol: 160 mg/dL (ref 0–200)
LDL Cholesterol: 87 mg/dL (ref 0–99)
Triglycerides: 194 mg/dL — ABNORMAL HIGH (ref 0.0–149.0)

## 2011-01-21 LAB — HEPATIC FUNCTION PANEL
ALT: 30 U/L (ref 0–53)
AST: 25 U/L (ref 0–37)
Total Bilirubin: 0.8 mg/dL (ref 0.3–1.2)
Total Protein: 7 g/dL (ref 6.0–8.3)

## 2011-01-21 NOTE — Assessment & Plan Note (Signed)
Nathan [name] is doing well. He's not having episodes of chest pain. We'll continue the same medications. We'll check his lipids today and again in 6 months.

## 2011-01-21 NOTE — Patient Instructions (Addendum)
Your physician wants you to follow-up in: 6 months You will receive a reminder letter in the mail two months in advance. If you don't receive a letter, please call our office to schedule the follow-up appointment.   Your physician recommends that you return for a FASTING lipid profile: 6 months and today

## 2011-01-21 NOTE — Progress Notes (Signed)
[name] Date of Birth  1934/01/22 [facility] / [facility] [id number] [address]     [name], [place]  [address] [phone]  Fax  [phone]  History of Present Illness:  [name] is a 77 year old gentleman with a history of coronary artery disease. He status post coronary artery bypass grafting. He also has a history of hypertension and diabetes mellitus.  He's walking without any difficulty. 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  No major complaints.  BP is a bit high today. He did not sleep well overnight.   BP has been normal on other recent MD visits.  Current Outpatient Prescriptions on File Prior to Visit  Medication Sig Dispense Refill  . amLODipine (NORVASC) 5 MG tablet Take 5 mg by mouth daily.        [name] atenolol (TENORMIN) 50 MG tablet Take 50 mg by mouth daily.        [name] glipiZIDE (GLUCOTROL) 10 MG tablet Take 10 mg by mouth 2 (two) times daily before a meal.        . lisinopril (PRINIVIL,ZESTRIL) 2.5 MG tablet Take 2.5 mg by mouth daily.        . metFORMIN (GLUCOPHAGE) 500 MG tablet Take 500 mg by mouth. Taking 1 in AM and 2 in PM      . multivitamin (THERAGRAN) per tablet Take 1 tablet by mouth daily.        . Omega-3 Fatty Acids (FISH OIL) 1200 MG CAPS Take 1 capsule by mouth daily.        . simvastatin (ZOCOR) 20 MG tablet Take 20 mg by mouth at bedtime.          No Known Allergies  Past Medical History  Diagnosis Date  . Hyperlipidemia   . Coronary artery disease     CABG x5  . Diabetes mellitus   . Hypercholesteremia   . Mitral valve regurgitation   . Aortic insufficiency   . Skin cancer     tumor removed off of his back  . Acid reflux     Past Surgical History  Procedure Date  . Coronary artery bypass graft 12/21/2000    x5  . Tumor removal     off of back, skin cancer    History  Smoking status  . Never Smoker   Smokeless tobacco  . Not on file    History  Alcohol  Use No    Family History  Problem Relation Age of Onset  . Heart attack Father   . Hypertension Mother   . Diabetes Brother   . Cancer Sister     breast  . Cancer Brother     throat & mouth    Reviw of Systems:  Reviewed in the HPI.  All other systems are negative.  Physical Exam: BP 154/91  Pulse 75  Ht 6' (1.829 m)  Wt 178 lb 12.8 oz (81.103 kg)  BMI 24.25 kg/m2.  His BP is normally well controlled.  The patient is alert and oriented x 3.  The mood and affect are normal.  The skin is warm and dry.  Color is normal.  The HEENT exam reveals that the sclera are nonicteric.  The mucous membranes are moist.  The carotids are 2+ without bruits.  There is no thyromegaly.  There is no JVD.  The lungs are clear.  The chest wall is non tender.  The heart exam reveals a regular  rate with a normal S1 and S2.  There is a soft systolic murmur.  The PMI is not displaced.   Abdominal exam reveals good bowel sounds.  There is no guarding or rebound.  There is no hepatosplenomegaly or tenderness.  There are no masses.  Exam of the legs reveal no clubbing, cyanosis, or edema.  The legs are without rashes.  The distal pulses are intact.  Cranial nerves II - XII are intact.  Motor and sensory functions are intact.  The gait is normal.  Assessment / Plan:

## 2011-01-21 NOTE — Assessment & Plan Note (Signed)
Nathan [name] describes an episode of syncope. This occurred when he was driving his car. It lasted for a few minutes and then resolve spontaneously. He did not have any ill effects afterwards.  He's not had any syncope or presyncope  since that time. It does not sound like is having significant bradycardia but we will certainly have him evaluated if he has further episodes of syncope.

## 2011-02-26 DIAGNOSIS — L608 Other nail disorders: Secondary | ICD-10-CM | POA: Diagnosis not present

## 2011-02-26 DIAGNOSIS — E1149 Type 2 diabetes mellitus with other diabetic neurological complication: Secondary | ICD-10-CM | POA: Diagnosis not present

## 2011-03-16 DIAGNOSIS — Z7901 Long term (current) use of anticoagulants: Secondary | ICD-10-CM | POA: Diagnosis not present

## 2011-03-16 DIAGNOSIS — I82409 Acute embolism and thrombosis of unspecified deep veins of unspecified lower extremity: Secondary | ICD-10-CM | POA: Diagnosis not present

## 2011-04-16 DIAGNOSIS — I82409 Acute embolism and thrombosis of unspecified deep veins of unspecified lower extremity: Secondary | ICD-10-CM | POA: Diagnosis not present

## 2011-04-16 DIAGNOSIS — Z7901 Long term (current) use of anticoagulants: Secondary | ICD-10-CM | POA: Diagnosis not present

## 2011-04-28 DIAGNOSIS — J069 Acute upper respiratory infection, unspecified: Secondary | ICD-10-CM | POA: Diagnosis not present

## 2011-04-28 DIAGNOSIS — I10 Essential (primary) hypertension: Secondary | ICD-10-CM | POA: Diagnosis not present

## 2011-05-26 DIAGNOSIS — E1165 Type 2 diabetes mellitus with hyperglycemia: Secondary | ICD-10-CM | POA: Diagnosis not present

## 2011-05-26 DIAGNOSIS — Z125 Encounter for screening for malignant neoplasm of prostate: Secondary | ICD-10-CM | POA: Diagnosis not present

## 2011-05-26 DIAGNOSIS — E785 Hyperlipidemia, unspecified: Secondary | ICD-10-CM | POA: Diagnosis not present

## 2011-05-26 DIAGNOSIS — E1129 Type 2 diabetes mellitus with other diabetic kidney complication: Secondary | ICD-10-CM | POA: Diagnosis not present

## 2011-05-27 DIAGNOSIS — E119 Type 2 diabetes mellitus without complications: Secondary | ICD-10-CM | POA: Diagnosis not present

## 2011-05-27 DIAGNOSIS — H524 Presbyopia: Secondary | ICD-10-CM | POA: Diagnosis not present

## 2011-05-27 DIAGNOSIS — H52229 Regular astigmatism, unspecified eye: Secondary | ICD-10-CM | POA: Diagnosis not present

## 2011-05-27 DIAGNOSIS — H5200 Hypermetropia, unspecified eye: Secondary | ICD-10-CM | POA: Diagnosis not present

## 2011-05-28 DIAGNOSIS — E1149 Type 2 diabetes mellitus with other diabetic neurological complication: Secondary | ICD-10-CM | POA: Diagnosis not present

## 2011-05-28 DIAGNOSIS — L608 Other nail disorders: Secondary | ICD-10-CM | POA: Diagnosis not present

## 2011-06-02 DIAGNOSIS — I10 Essential (primary) hypertension: Secondary | ICD-10-CM | POA: Diagnosis not present

## 2011-06-02 DIAGNOSIS — E1129 Type 2 diabetes mellitus with other diabetic kidney complication: Secondary | ICD-10-CM | POA: Diagnosis not present

## 2011-06-02 DIAGNOSIS — Z125 Encounter for screening for malignant neoplasm of prostate: Secondary | ICD-10-CM | POA: Diagnosis not present

## 2011-06-02 DIAGNOSIS — Z0000 Encounter for general adult medical examination without abnormal findings: Secondary | ICD-10-CM | POA: Diagnosis not present

## 2011-06-02 DIAGNOSIS — E1165 Type 2 diabetes mellitus with hyperglycemia: Secondary | ICD-10-CM | POA: Diagnosis not present

## 2011-06-02 DIAGNOSIS — I82409 Acute embolism and thrombosis of unspecified deep veins of unspecified lower extremity: Secondary | ICD-10-CM | POA: Diagnosis not present

## 2011-06-03 DIAGNOSIS — Z1212 Encounter for screening for malignant neoplasm of rectum: Secondary | ICD-10-CM | POA: Diagnosis not present

## 2011-08-10 ENCOUNTER — Encounter: Payer: Self-pay | Admitting: Cardiovascular Disease

## 2011-08-10 ENCOUNTER — Ambulatory Visit (INDEPENDENT_AMBULATORY_CARE_PROVIDER_SITE_OTHER): Payer: Medicare Other | Admitting: Cardiovascular Disease

## 2011-08-10 VITALS — BP 134/74 | HR 69 | Ht 72.0 in | Wt 177.8 lb

## 2011-08-10 DIAGNOSIS — I340 Nonrheumatic mitral (valve) insufficiency: Secondary | ICD-10-CM

## 2011-08-10 DIAGNOSIS — I059 Rheumatic mitral valve disease, unspecified: Secondary | ICD-10-CM | POA: Diagnosis not present

## 2011-08-10 DIAGNOSIS — I2510 Atherosclerotic heart disease of native coronary artery without angina pectoris: Secondary | ICD-10-CM

## 2011-08-10 DIAGNOSIS — E7800 Pure hypercholesterolemia, unspecified: Secondary | ICD-10-CM

## 2011-08-10 NOTE — Assessment & Plan Note (Signed)
Nathan [name] is doing well. He's not having episodes of chest pain. We'll continue the same medications. We'll check his lipids today and again in 6 months.  His EKG to have some mild abnormalities but he is not having angina. He still remains fairly active. I've asked him to call me if he develops any chest pains.

## 2011-08-10 NOTE — Patient Instructions (Addendum)
Your physician wants you to follow-up in: 6 months  You will receive a reminder letter in the mail two months in advance. If you don't receive a letter, please call our office to schedule the follow-up appointment.   Your physician recommends that you return for a FASTING lipid profile: please bring copy of labs/ we will draw labs in 6 months

## 2011-08-10 NOTE — Assessment & Plan Note (Signed)
We'll continue with his same medications. We'll check fasting lipids in 6 months.

## 2011-08-10 NOTE — Progress Notes (Signed)
Nathan [name] Date of Birth  10-Nov-1933       [facility]    [employer] [address] [place] [address], [place], [place]  [address]   [place], [place]  [address] [phone]     [phone]   Fax  [phone]    Fax [phone]  Problem List: 1. Coronary artery disease-status post CABG 2. Aortic insufficiency 3. Hypercholesterolemia 4. Diabetes mellitus 5. Mitral regurgitation  History of Present Illness:  Nathan [name] is doing very well. He's not had any episodes of chest pain or shortness of breath. 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  Current Outpatient Prescriptions on File Prior to Visit  Medication Sig Dispense Refill  . amLODipine (NORVASC) 5 MG tablet Take 5 mg by mouth daily.        [name] atenolol (TENORMIN) 50 MG tablet Take 100 mg by mouth daily.       [name] glipiZIDE (GLUCOTROL) 10 MG tablet Take 10 mg by mouth 2 (two) times daily before a meal.        . lisinopril (PRINIVIL,ZESTRIL) 2.5 MG tablet Take 2.5 mg by mouth daily.        . metFORMIN (GLUCOPHAGE) 500 MG tablet Take 500 mg by mouth. Taking 1 in AM and 2 in PM      . multivitamin (THERAGRAN) per tablet Take 1 tablet by mouth daily.        . Omega-3 Fatty Acids (FISH OIL) 1200 MG CAPS Take 3 capsules by mouth daily.       [name] omeprazole (PRILOSEC) 20 MG capsule Take 20 mg by mouth daily.       . simvastatin (ZOCOR) 20 MG tablet Take 20 mg by mouth at bedtime.          No Known Allergies  Past Medical History  Diagnosis Date  . Hyperlipidemia   . Coronary artery disease     CABG x5  . Diabetes mellitus   . Hypercholesteremia   . Mitral valve regurgitation   . Aortic insufficiency   . Skin cancer     tumor removed off of his back  . Acid reflux     Past Surgical History  Procedure Date  . Coronary artery bypass graft 12/21/2000    x5  . Tumor removal     off of back, skin cancer    History  Smoking status    . Never Smoker   Smokeless tobacco  . Not on file    History  Alcohol Use No    Family History  Problem Relation Age of Onset  . Heart attack Father   . Hypertension Mother   . Diabetes Brother   . Cancer Sister     breast  . Cancer Brother     throat & mouth    Reviw of Systems:  Reviewed in the HPI.  All other systems are negative.  Physical Exam: Blood pressure 134/74, pulse 69, height 6' (1.829 m), weight 177 lb 12.8 oz (80.65 kg). General: Well developed, well nourished, in no acute distress.  Head: Normocephalic, atraumatic, sclera non-icteric, mucus membranes are moist,   Neck: Supple. Carotids are 2 + without bruits. No JVD  Lungs: Clear bilaterally to auscultation.  Heart: regular rate.  normal  S1 S2. No murmurs, gallops or rubs.  Abdomen: Soft, non-tender, non-distended with normal bowel sounds. No hepatomegaly. No rebound/guarding. No masses.  Msk:  Strength  and tone are normal  Extremities: No clubbing or cyanosis. No edema.  Distal pedal pulses are 2+ and equal bilaterally.  Neuro: Alert and oriented X 3. Moves all extremities spontaneously.  Psych:  Responds to questions appropriately with a normal affect.  ECG: August 10, 2011 --sinus rhythm with occasional premature ventricular contractions. Is poor R wave progression consistent with a septal infarct.  He has nonspecific ST and T-wave changes in the inferior leads.  Assessment / Plan:

## 2011-08-27 DIAGNOSIS — E1149 Type 2 diabetes mellitus with other diabetic neurological complication: Secondary | ICD-10-CM | POA: Diagnosis not present

## 2011-08-27 DIAGNOSIS — L608 Other nail disorders: Secondary | ICD-10-CM | POA: Diagnosis not present

## 2011-09-08 DIAGNOSIS — R0602 Shortness of breath: Secondary | ICD-10-CM | POA: Diagnosis not present

## 2011-09-08 DIAGNOSIS — E1165 Type 2 diabetes mellitus with hyperglycemia: Secondary | ICD-10-CM | POA: Diagnosis not present

## 2011-09-08 DIAGNOSIS — E1129 Type 2 diabetes mellitus with other diabetic kidney complication: Secondary | ICD-10-CM | POA: Diagnosis not present

## 2011-09-08 DIAGNOSIS — R5381 Other malaise: Secondary | ICD-10-CM | POA: Diagnosis not present

## 2011-09-08 DIAGNOSIS — I10 Essential (primary) hypertension: Secondary | ICD-10-CM | POA: Diagnosis not present

## 2011-09-23 DIAGNOSIS — D235 Other benign neoplasm of skin of trunk: Secondary | ICD-10-CM | POA: Diagnosis not present

## 2011-09-23 DIAGNOSIS — L570 Actinic keratosis: Secondary | ICD-10-CM | POA: Diagnosis not present

## 2011-09-27 DIAGNOSIS — R0602 Shortness of breath: Secondary | ICD-10-CM | POA: Diagnosis not present

## 2011-09-27 DIAGNOSIS — I82409 Acute embolism and thrombosis of unspecified deep veins of unspecified lower extremity: Secondary | ICD-10-CM | POA: Diagnosis not present

## 2011-09-27 DIAGNOSIS — E1165 Type 2 diabetes mellitus with hyperglycemia: Secondary | ICD-10-CM | POA: Diagnosis not present

## 2011-09-29 ENCOUNTER — Encounter: Payer: Self-pay | Admitting: Nurse Practitioner

## 2011-09-29 ENCOUNTER — Ambulatory Visit (INDEPENDENT_AMBULATORY_CARE_PROVIDER_SITE_OTHER): Payer: Medicare Other | Admitting: Nurse Practitioner

## 2011-09-29 VITALS — BP 128/78 | HR 77 | Ht 72.0 in | Wt 174.1 lb

## 2011-09-29 DIAGNOSIS — R5383 Other fatigue: Secondary | ICD-10-CM | POA: Diagnosis not present

## 2011-09-29 DIAGNOSIS — R5381 Other malaise: Secondary | ICD-10-CM

## 2011-09-29 DIAGNOSIS — I38 Endocarditis, valve unspecified: Secondary | ICD-10-CM | POA: Diagnosis not present

## 2011-09-29 DIAGNOSIS — R0609 Other forms of dyspnea: Secondary | ICD-10-CM | POA: Diagnosis not present

## 2011-09-29 DIAGNOSIS — R0989 Other specified symptoms and signs involving the circulatory and respiratory systems: Secondary | ICD-10-CM | POA: Diagnosis not present

## 2011-09-29 DIAGNOSIS — I2510 Atherosclerotic heart disease of native coronary artery without angina pectoris: Secondary | ICD-10-CM

## 2011-09-29 DIAGNOSIS — R0600 Dyspnea, unspecified: Secondary | ICD-10-CM

## 2011-09-29 LAB — BASIC METABOLIC PANEL
BUN: 29 mg/dL — ABNORMAL HIGH (ref 6–23)
CO2: 24 mEq/L (ref 19–32)
Calcium: 9.6 mg/dL (ref 8.4–10.5)
Chloride: 108 mEq/L (ref 96–112)
Creatinine, Ser: 1.4 mg/dL (ref 0.4–1.5)
GFR: 53.72 mL/min — ABNORMAL LOW (ref >60.00)
Glucose, Bld: 146 mg/dL — ABNORMAL HIGH (ref 70–99)
Potassium: 5 mEq/L (ref 3.5–5.1)
Sodium: 140 mEq/L (ref 135–145)

## 2011-09-29 LAB — BRAIN NATRIURETIC PEPTIDE: Pro B Natriuretic peptide (BNP): 149 pg/mL — ABNORMAL HIGH (ref 0.0–100.0)

## 2011-09-29 LAB — TSH: TSH: 2.88 u[IU]/mL (ref 0.35–5.50)

## 2011-09-29 NOTE — Patient Instructions (Addendum)
We are going to check labs today  We are going to arrange for an ultrasound of your heart and a stress test (lexiscan)  I will see you back after your tests are complete  For now, stay on your current medicines.  Call the [facility] office at [phone] if you have any questions, problems or concerns.

## 2011-09-29 NOTE — Progress Notes (Signed)
Nathan [name] Date of Birth: 11/12/1933 Medical Record [id number]  History of Present Illness: Nathan [name] is seen today for a work in visit. He is seen for Dr. Acie Fredrickson. He is here at the request of Dr. Philip Aspen. He is 78 years of age. Has DM, valvular heart disease and known CAD with remote CABG back in 2002 x 5 per Dr. Cyndia Bent. No recent stress testing or echo noted.   He comes in today. He is here alone. He was just seen in June and felt to be doing ok. Since then, he has noticed a marked decline in his energy level and exercise tolerance. He is short of breath, especially with exertion. No chest pain that he knows of and it sound like he never had chest pain before his CABG. He could previously walk 2 to 3 miles but now can hardly go for 1/2 mile. Weight is down a little. No fever, chills, nightsweats. Negative CXR in July of this year. Has had prior DVT but Dr. Philip Aspen recently checked a d-dimer and it was normal. No swelling. He is no longer on coumadin.   Current Outpatient Prescriptions on File Prior to Visit  Medication Sig Dispense Refill  . amLODipine (NORVASC) 5 MG tablet Take 5 mg by mouth daily.        [name] aspirin 81 MG tablet Take 81 mg by mouth daily.      [name] atenolol (TENORMIN) 50 MG tablet Take 100 mg by mouth daily.       [name] glipiZIDE (GLUCOTROL) 10 MG tablet Take 10 mg by mouth 2 (two) times daily before a meal.        . lisinopril (PRINIVIL,ZESTRIL) 2.5 MG tablet Take 2.5 mg by mouth daily.        . metFORMIN (GLUCOPHAGE) 500 MG tablet Take 500 mg by mouth. Taking 1 in AM and 2 in PM      . multivitamin (THERAGRAN) per tablet Take 1 tablet by mouth daily.        . Omega-3 Fatty Acids (FISH OIL) 1200 MG CAPS Take 3 capsules by mouth daily.       [name] omeprazole (PRILOSEC) 20 MG capsule Take 20 mg by mouth daily.       . simvastatin (ZOCOR) 20 MG tablet Take 20 mg by mouth at bedtime.          No Known Allergies  Past Medical History  Diagnosis Date  . Hyperlipidemia   .  Coronary artery disease 2002    CABG x5  . Diabetes mellitus   . Hypercholesteremia   . Mitral valve regurgitation   . Aortic insufficiency   . Skin cancer     tumor removed off of his back  . Acid reflux   . DVT (deep venous thrombosis)     previously on coumadin    Past Surgical History  Procedure Date  . Coronary artery bypass graft 12/21/2000    x5  . Tumor removal     off of back, skin cancer    History  Smoking status  . Never Smoker   Smokeless tobacco  . Not on file    History  Alcohol Use No    Family History  Problem Relation Age of Onset  . Heart attack Father   . Hypertension Mother   . Diabetes Brother   . Cancer Sister     breast  . Cancer Brother     throat & mouth    Review of Systems: The  review of systems is per the HPI.  All other systems were reviewed and are negative.  Physical Exam: BP 128/78  Pulse 77  Ht 6' (1.829 m)  Wt 174 lb 1.9 oz (78.98 kg)  BMI 23.61 kg/m2  SpO2 94% Patient is very pleasant and in no acute distress. Skin is warm and dry. Color is normal.  HEENT is unremarkable except for missing teeth. Normocephalic/atraumatic. PERRL. Sclera are nonicteric. Neck is supple. No masses. No JVD. Lungs are clear. Cardiac exam shows a regular rate and rhythm. Occasional ectopic noted. He has a 2/6 murmur of AI noted. Abdomen is soft. Extremities are without edema. Gait and ROM are intact. No gross neurologic deficits noted.   LABORATORY DATA: PENDING    Assessment / Plan:

## 2011-09-29 NOTE — Assessment & Plan Note (Signed)
He has known AI and MR. Now with complaints of fatigue and dyspnea. Will check BNP, BMET and TSH today. His d-dimer from Dr. Shon Baton was normal as well as his CBC per phone report. We will also arrange for Lexiscan. I will see him back after his studies are complete. Patient is agreeable to this plan and will call if any problems develop in the interim.

## 2011-09-29 NOTE — Assessment & Plan Note (Signed)
His CABG was about 11 years ago. Does not sound like he had angina then. He is diabetic. Will update his stress test. Further disposition to follow. Patient is agreeable to this plan and will call if any problems develop in the interim.

## 2011-09-30 ENCOUNTER — Telehealth: Payer: Self-pay | Admitting: *Deleted

## 2011-09-30 NOTE — Telephone Encounter (Signed)
Pt notified of lab results.  Concepcion Living, CMA

## 2011-09-30 NOTE — Telephone Encounter (Signed)
Message copied by Iona Hansen on Thu Sep 30, 2011  4:29 PM ------      Message from: Burtis Junes      Created: Wed Sep 29, 2011  2:07 PM       Ok to report. Labs are satisfactory. Will see how the echo and stress test turn out.

## 2011-10-01 ENCOUNTER — Ambulatory Visit (HOSPITAL_COMMUNITY): Payer: Medicare Other | Attending: Internal Medicine

## 2011-10-01 DIAGNOSIS — E119 Type 2 diabetes mellitus without complications: Secondary | ICD-10-CM | POA: Insufficient documentation

## 2011-10-01 DIAGNOSIS — I080 Rheumatic disorders of both mitral and aortic valves: Secondary | ICD-10-CM | POA: Diagnosis not present

## 2011-10-01 DIAGNOSIS — I2510 Atherosclerotic heart disease of native coronary artery without angina pectoris: Secondary | ICD-10-CM | POA: Insufficient documentation

## 2011-10-01 DIAGNOSIS — I379 Nonrheumatic pulmonary valve disorder, unspecified: Secondary | ICD-10-CM | POA: Diagnosis not present

## 2011-10-01 DIAGNOSIS — R0989 Other specified symptoms and signs involving the circulatory and respiratory systems: Secondary | ICD-10-CM | POA: Insufficient documentation

## 2011-10-01 DIAGNOSIS — I079 Rheumatic tricuspid valve disease, unspecified: Secondary | ICD-10-CM | POA: Insufficient documentation

## 2011-10-01 DIAGNOSIS — R0600 Dyspnea, unspecified: Secondary | ICD-10-CM

## 2011-10-01 DIAGNOSIS — R0609 Other forms of dyspnea: Secondary | ICD-10-CM | POA: Diagnosis not present

## 2011-10-01 NOTE — Progress Notes (Signed)
Echocardiogram performed.  

## 2011-10-05 ENCOUNTER — Other Ambulatory Visit (HOSPITAL_COMMUNITY): Payer: Medicare Other

## 2011-10-06 ENCOUNTER — Ambulatory Visit (HOSPITAL_COMMUNITY): Payer: Medicare Other | Attending: Cardiology | Admitting: Radiology

## 2011-10-06 VITALS — BP 134/77 | HR 66 | Ht 72.0 in | Wt 176.0 lb

## 2011-10-06 DIAGNOSIS — I4949 Other premature depolarization: Secondary | ICD-10-CM

## 2011-10-06 DIAGNOSIS — R5381 Other malaise: Secondary | ICD-10-CM | POA: Insufficient documentation

## 2011-10-06 DIAGNOSIS — R55 Syncope and collapse: Secondary | ICD-10-CM | POA: Insufficient documentation

## 2011-10-06 DIAGNOSIS — E119 Type 2 diabetes mellitus without complications: Secondary | ICD-10-CM | POA: Diagnosis not present

## 2011-10-06 DIAGNOSIS — I10 Essential (primary) hypertension: Secondary | ICD-10-CM | POA: Diagnosis not present

## 2011-10-06 DIAGNOSIS — R0609 Other forms of dyspnea: Secondary | ICD-10-CM | POA: Diagnosis not present

## 2011-10-06 DIAGNOSIS — Z8249 Family history of ischemic heart disease and other diseases of the circulatory system: Secondary | ICD-10-CM | POA: Insufficient documentation

## 2011-10-06 DIAGNOSIS — R0600 Dyspnea, unspecified: Secondary | ICD-10-CM

## 2011-10-06 DIAGNOSIS — I2510 Atherosclerotic heart disease of native coronary artery without angina pectoris: Secondary | ICD-10-CM

## 2011-10-06 DIAGNOSIS — R0602 Shortness of breath: Secondary | ICD-10-CM | POA: Diagnosis not present

## 2011-10-06 DIAGNOSIS — R0989 Other specified symptoms and signs involving the circulatory and respiratory systems: Secondary | ICD-10-CM | POA: Insufficient documentation

## 2011-10-06 MED ORDER — TECHNETIUM TC 99M TETROFOSMIN IV KIT
10.0000 | PACK | Freq: Once | INTRAVENOUS | Status: AC | PRN
  Administered 2011-10-06: 10 via INTRAVENOUS

## 2011-10-06 MED ORDER — TECHNETIUM TC 99M TETROFOSMIN IV KIT
30.0000 | PACK | Freq: Once | INTRAVENOUS | Status: AC | PRN
  Administered 2011-10-06: 30 via INTRAVENOUS

## 2011-10-06 MED ORDER — REGADENOSON 0.4 MG/5ML IV SOLN
0.4000 mg | Freq: Once | INTRAVENOUS | Status: AC
  Administered 2011-10-06: 0.4 mg via INTRAVENOUS

## 2011-10-06 NOTE — Progress Notes (Signed)
[facility] [address] [phone]  Cardiology Nuclear Med Study  Nathan [name] is a 78 y.o. male     MRN : [id number]     DOB: 15-Jun-1933  Procedure Date: 10/06/2011  Nuclear Med Background Indication for Stress Test:  Evaluation for Ischemia and Graft Patency History:  '02 MPS:No ischemia, (+) EKG>CABG x 5; 10/01/11 Echo:EF=65%, moderate AI, mild to moderate AR. Cardiac Risk Factors: Family History - CAD, Hypertension, Lipids and NIDDM.  Symptoms:  Dizziness, DOE, Fatigue with Exertion, Near Syncope and SOB.   Nuclear Pre-Procedure Caffeine/Decaff Intake:  None > 12 hrs NPO After: 7:30pm   Lungs:  Clear. O2 Sat: 95% on room air. IV 0.9% NS with Angio Cath:  22g  IV Site: R Antecubital x 1, tolerated well IV Started by:  Irven Baltimore, RN  Chest Size (in):  40 Cup Size: n/a  Height: 6' (1.829 m)  Weight:  176 lb (79.833 kg)  BMI:  Body mass index is 23.87 kg/(m^2). Tech Comments:  Took atenolol last night per patient, no medications today    Nuclear Med Study 1 or 2 day study: 1 day  Stress Test Type:  Treadmill/Lexiscan  Reading MD: Loralie Champagne, MD  Order Authorizing Provider:  [name], MD, and Truitt Merle, NP  Resting Radionuclide: Technetium 44m Tetrofosmin  Resting Radionuclide Dose: 11.0 mCi   Stress Radionuclide:  Technetium 34m Tetrofosmin  Stress Radionuclide Dose: 33.0 mCi           Stress Protocol Rest HR: 66 Stress HR: 102  Rest BP: 134/77 Stress BP: 194/99  Exercise Time (min): 2:00 METS: n/a   Predicted Max HR: 145 bpm % Max HR: 70.34 bpm Rate Pressure Product: 18768   Dose of Adenosine (mg):  n/a Dose of Lexiscan: 0.4 mg  Dose of Atropine (mg): n/a Dose of Dobutamine: n/a mcg/kg/min (at max HR)  Stress Test Technologist: Letta Moynahan, CMA-N  Nuclear Technologist:  Evalina Field, RT-N     Rest Procedure:  Myocardial perfusion imaging was performed at rest 45 minutes following the  intravenous administration of Technetium 50m Tetrofosmin.  Rest ECG: Nonspecific T-wave changes with occasional PVC's.  Stress Procedure:  The patient received IV Lexiscan 0.4 mg over 15-seconds with concurrent low level exercise and then Technetium 1m Tetrofosmin was injected at 30-seconds while the patient continued walking one more minute. There were more diffuse ST-T wave changes with Lexiscan and frequent PVC's with couplets. Quantitative spect images were obtained after a 45-minute delay.  Stress ECG: 1 mm ST depression in leads V4-V5.   QPS Raw Data Images:  Normal; no motion artifact; normal heart/lung ratio. Stress Images:  Normal homogeneous uptake in all areas of the myocardium. Rest Images:  Normal homogeneous uptake in all areas of the myocardium. Subtraction (SDS):  There is no evidence of scar or ischemia. Transient Ischemic Dilatation (Normal <1.22):  1.14 Lung/Heart Ratio (Normal <0.45):  0.26  Quantitative Gated Spect Images QGS EDV:  NA  QGS ESV:  NA   Impression Exercise Capacity:  Lexiscan with low level exercise. BP Response:  Hypertensive blood pressure response. Clinical Symptoms:  Short of breath.  ECG Impression:  1 mm ST depression in V4-V5.  Comparison with Prior Nuclear Study: No images to compare  Overall Impression:  Normal stress nuclear study.  No perfusion defects though there was 1 mm ST depression in 2 leads on Lexiscan ECG.  Suspect this was a false positive finding given  the perfusion images.   LV Ejection Fraction: Not gated.  LV Wall Motion:  Not gated  Loralie Champagne 10/06/2011

## 2011-10-13 ENCOUNTER — Telehealth: Payer: Self-pay | Admitting: Cardiovascular Disease

## 2011-10-13 NOTE — Telephone Encounter (Signed)
Patient returning nurse call, he can be reached at [phone]

## 2011-10-13 NOTE — Telephone Encounter (Signed)
Stress test result given.

## 2011-10-19 ENCOUNTER — Encounter: Payer: Self-pay | Admitting: Nurse Practitioner

## 2011-10-19 ENCOUNTER — Ambulatory Visit (INDEPENDENT_AMBULATORY_CARE_PROVIDER_SITE_OTHER): Payer: Medicare Other | Admitting: Nurse Practitioner

## 2011-10-19 VITALS — BP 142/76 | HR 61 | Ht 72.0 in | Wt 178.0 lb

## 2011-10-19 DIAGNOSIS — R5383 Other fatigue: Secondary | ICD-10-CM

## 2011-10-19 DIAGNOSIS — R5381 Other malaise: Secondary | ICD-10-CM | POA: Diagnosis not present

## 2011-10-19 NOTE — Progress Notes (Signed)
[name] Date of Birth: [birth year] Medical Record [id number]  History of Present Illness: Mr. Claffey is seen back today for a follow up visit. He is seen for Dr. Acie Fredrickson. He has DM, valvular heart disease with a known functional bicuspid AV and known CAD with remote CABG back in 2002 x 5 per Dr. Cyndia Bent. He was seen last month with complaints of fatigue and DOE. He had had a negative d dimer by his PCP. Negative CXR in July of this year. We arranged for repeat echo and stress testing.  He comes in today. He is here alone. Seems to be doing a little better. His studies were basically satisfactory. No further evaluation was felt to be needed. He still does not have a lot of energy, especially in the morning. He is playing golf. He does report that he has nocturia x 4-5 each night. Because of the nocturia, he is not resting well. No chest pain. Not dizzy.   Current Outpatient Prescriptions on File Prior to Visit  Medication Sig Dispense Refill  . amLODipine (NORVASC) 5 MG tablet Take 5 mg by mouth daily.        [name] aspirin 81 MG tablet Take 81 mg by mouth daily.      [name] atenolol (TENORMIN) 50 MG tablet Take 100 mg by mouth daily.       [name] glipiZIDE (GLUCOTROL) 10 MG tablet Take 10 mg by mouth 2 (two) times daily before a meal.        . lisinopril (PRINIVIL,ZESTRIL) 2.5 MG tablet Take 2.5 mg by mouth daily.        . metFORMIN (GLUCOPHAGE) 500 MG tablet Take 500 mg by mouth. Taking 1 in AM and 2 in PM      . multivitamin (THERAGRAN) per tablet Take 1 tablet by mouth daily.        . Omega-3 Fatty Acids (FISH OIL) 1200 MG CAPS Take 3 capsules by mouth daily.       [name] omeprazole (PRILOSEC) 20 MG capsule Take 20 mg by mouth daily.       . simvastatin (ZOCOR) 20 MG tablet Take 20 mg by mouth at bedtime.          No Known Allergies  Past Medical History  Diagnosis Date  . Hyperlipidemia   . Coronary artery disease 2002    CABG x5  . Diabetes mellitus   . Hypercholesteremia   . Mitral valve  regurgitation   . Aortic insufficiency   . Skin cancer     tumor removed off of his back  . Acid reflux   . DVT (deep venous thrombosis)     previously on coumadin    Past Surgical History  Procedure Date  . Coronary artery bypass graft 12/21/2000    x5  . Tumor removal     off of back, skin cancer    History  Smoking status  . Never Smoker   Smokeless tobacco  . Not on file    History  Alcohol Use No    Family History  Problem Relation Age of Onset  . Heart attack Father   . Hypertension Mother   . Diabetes Brother   . Cancer Sister     breast  . Cancer Brother     throat & mouth    Review of Systems: The review of systems is per the HPI.  All other systems were reviewed and are negative.  Physical Exam: BP 142/76  Pulse 61  Ht  6' (1.829 m)  Wt 178 lb (80.74 kg)  BMI 24.14 kg/m2 Patient is very pleasant and in no acute distress. Skin is warm and dry. Color is normal.  HEENT is unremarkable. Normocephalic/atraumatic. PERRL. Sclera are nonicteric. Neck is supple. No masses. No JVD. Lungs are clear. Cardiac exam shows a regular rate and rhythm. Outflow murmur noted. He does have frequent ectopics noted. Abdomen is soft. Extremities are without edema. Gait and ROM are intact. No gross neurologic deficits noted.   LABORATORY DATA: EKG today shows sinus rhythm with PVCs.   Myoview Overall Impression: Normal stress nuclear study. No perfusion defects though there was 1 mm ST depression in 2 leads on Lexiscan ECG. Suspect this was a false positive finding given the perfusion images.  LV Ejection Fraction: Not gated. LV Wall Motion: Not gated   Loralie Champagne  10/06/2011     Chemistry      Component Value Date/Time   NA 140 09/29/2011 0942   K 5.0 09/29/2011 0942   CL 108 09/29/2011 0942   CO2 24 09/29/2011 0942   BUN 29* 09/29/2011 0942   CREATININE 1.4 09/29/2011 0942      Component Value Date/Time   CALCIUM 9.6 09/29/2011 0942   ALKPHOS 48 01/21/2011 1125    AST 25 01/21/2011 1125   ALT 30 01/21/2011 1125   BILITOT 0.8 01/21/2011 1125     Echo Study Conclusions  - Left ventricle: The cavity size was normal. Wall thickness was increased in a pattern of mild LVH. Systolic function was normal. The estimated ejection fraction was in the range of 60% to 65%. Features are consistent with a pseudonormal left ventricular filling pattern, with concomitant abnormal relaxation and increased filling pressure (grade 2 diastolic dysfunction). - Aortic valve: Aortic valve is trileaflet however there appears to be tethering of the R coronary cusp. Aortic insufficiency is probably moderate, with very eccentric course. Would follow. Mild to moderate regurgitation. - Mitral valve: Mild regurgitation. - Pulmonary arteries: PA peak pressure: 77mm Hg (S).    Assessment / Plan: 1. Fatigue - His cardiac status is felt to be stable. He does report rather significant nocturia. This may be the issue. He is to see Dr. Philip Aspen later this month and will discuss with him further.   2. Valvular heart disease - echo was felt to be satisfactory.   3. CAD - Myoview was felt to be satisfactory.  We will see him back in December as planned. No change in his current therapy. He is seeing his PCP back later this month. Patient is agreeable to this plan and will call if any problems develop in the interim.

## 2011-10-19 NOTE — Patient Instructions (Signed)
See Dr. Philip Aspen later this month   I am going to send him a note about you.  Your heart tests look good  Stay on your current medicines  We will see you in December as planned.  Call the [facility] office at [phone] if you have any questions, problems or concerns.

## 2011-11-03 DIAGNOSIS — E1165 Type 2 diabetes mellitus with hyperglycemia: Secondary | ICD-10-CM | POA: Diagnosis not present

## 2011-11-03 DIAGNOSIS — R0602 Shortness of breath: Secondary | ICD-10-CM | POA: Diagnosis not present

## 2011-11-03 DIAGNOSIS — Z23 Encounter for immunization: Secondary | ICD-10-CM | POA: Diagnosis not present

## 2011-11-03 DIAGNOSIS — I10 Essential (primary) hypertension: Secondary | ICD-10-CM | POA: Diagnosis not present

## 2011-11-03 DIAGNOSIS — E1129 Type 2 diabetes mellitus with other diabetic kidney complication: Secondary | ICD-10-CM | POA: Diagnosis not present

## 2011-11-19 DIAGNOSIS — L608 Other nail disorders: Secondary | ICD-10-CM | POA: Diagnosis not present

## 2011-11-19 DIAGNOSIS — E1149 Type 2 diabetes mellitus with other diabetic neurological complication: Secondary | ICD-10-CM | POA: Diagnosis not present

## 2011-12-08 NOTE — Addendum Note (Signed)
Addended by: Levora Angel L on: 12/08/2011 12:03 PM   Modules accepted: Orders

## 2012-03-02 DIAGNOSIS — I359 Nonrheumatic aortic valve disorder, unspecified: Secondary | ICD-10-CM | POA: Diagnosis not present

## 2012-03-02 DIAGNOSIS — I10 Essential (primary) hypertension: Secondary | ICD-10-CM | POA: Diagnosis not present

## 2012-03-02 DIAGNOSIS — E1129 Type 2 diabetes mellitus with other diabetic kidney complication: Secondary | ICD-10-CM | POA: Diagnosis not present

## 2012-03-02 DIAGNOSIS — E1165 Type 2 diabetes mellitus with hyperglycemia: Secondary | ICD-10-CM | POA: Diagnosis not present

## 2012-03-07 DIAGNOSIS — E1149 Type 2 diabetes mellitus with other diabetic neurological complication: Secondary | ICD-10-CM | POA: Diagnosis not present

## 2012-03-07 DIAGNOSIS — L608 Other nail disorders: Secondary | ICD-10-CM | POA: Diagnosis not present

## 2012-05-03 DIAGNOSIS — E1129 Type 2 diabetes mellitus with other diabetic kidney complication: Secondary | ICD-10-CM | POA: Diagnosis not present

## 2012-05-03 DIAGNOSIS — E785 Hyperlipidemia, unspecified: Secondary | ICD-10-CM | POA: Diagnosis not present

## 2012-05-03 DIAGNOSIS — Z125 Encounter for screening for malignant neoplasm of prostate: Secondary | ICD-10-CM | POA: Diagnosis not present

## 2012-05-03 DIAGNOSIS — I10 Essential (primary) hypertension: Secondary | ICD-10-CM | POA: Diagnosis not present

## 2012-05-10 DIAGNOSIS — Z0000 Encounter for general adult medical examination without abnormal findings: Secondary | ICD-10-CM | POA: Diagnosis not present

## 2012-05-10 DIAGNOSIS — E785 Hyperlipidemia, unspecified: Secondary | ICD-10-CM | POA: Diagnosis not present

## 2012-05-10 DIAGNOSIS — I10 Essential (primary) hypertension: Secondary | ICD-10-CM | POA: Diagnosis not present

## 2012-05-10 DIAGNOSIS — I359 Nonrheumatic aortic valve disorder, unspecified: Secondary | ICD-10-CM | POA: Diagnosis not present

## 2012-05-10 DIAGNOSIS — E1129 Type 2 diabetes mellitus with other diabetic kidney complication: Secondary | ICD-10-CM | POA: Diagnosis not present

## 2012-05-30 DIAGNOSIS — L608 Other nail disorders: Secondary | ICD-10-CM | POA: Diagnosis not present

## 2012-05-30 DIAGNOSIS — E1149 Type 2 diabetes mellitus with other diabetic neurological complication: Secondary | ICD-10-CM | POA: Diagnosis not present

## 2012-06-07 DIAGNOSIS — E119 Type 2 diabetes mellitus without complications: Secondary | ICD-10-CM | POA: Diagnosis not present

## 2012-06-20 ENCOUNTER — Encounter: Payer: Self-pay | Admitting: Cardiovascular Disease

## 2012-06-23 DIAGNOSIS — L259 Unspecified contact dermatitis, unspecified cause: Secondary | ICD-10-CM | POA: Diagnosis not present

## 2012-08-21 DIAGNOSIS — IMO0002 Reserved for concepts with insufficient information to code with codable children: Secondary | ICD-10-CM | POA: Diagnosis not present

## 2012-08-21 DIAGNOSIS — R252 Cramp and spasm: Secondary | ICD-10-CM | POA: Diagnosis not present

## 2012-08-21 DIAGNOSIS — E785 Hyperlipidemia, unspecified: Secondary | ICD-10-CM | POA: Diagnosis not present

## 2012-08-21 DIAGNOSIS — E1129 Type 2 diabetes mellitus with other diabetic kidney complication: Secondary | ICD-10-CM | POA: Diagnosis not present

## 2012-08-21 DIAGNOSIS — I10 Essential (primary) hypertension: Secondary | ICD-10-CM | POA: Diagnosis not present

## 2012-08-21 DIAGNOSIS — I359 Nonrheumatic aortic valve disorder, unspecified: Secondary | ICD-10-CM | POA: Diagnosis not present

## 2012-08-21 DIAGNOSIS — E1165 Type 2 diabetes mellitus with hyperglycemia: Secondary | ICD-10-CM | POA: Diagnosis not present

## 2012-08-21 DIAGNOSIS — Z1331 Encounter for screening for depression: Secondary | ICD-10-CM | POA: Diagnosis not present

## 2012-08-25 DIAGNOSIS — E1149 Type 2 diabetes mellitus with other diabetic neurological complication: Secondary | ICD-10-CM | POA: Diagnosis not present

## 2012-08-25 DIAGNOSIS — L608 Other nail disorders: Secondary | ICD-10-CM | POA: Diagnosis not present

## 2012-09-25 DIAGNOSIS — D235 Other benign neoplasm of skin of trunk: Secondary | ICD-10-CM | POA: Diagnosis not present

## 2012-09-25 DIAGNOSIS — D1801 Hemangioma of skin and subcutaneous tissue: Secondary | ICD-10-CM | POA: Diagnosis not present

## 2012-09-25 DIAGNOSIS — L570 Actinic keratosis: Secondary | ICD-10-CM | POA: Diagnosis not present

## 2012-11-24 ENCOUNTER — Ambulatory Visit (INDEPENDENT_AMBULATORY_CARE_PROVIDER_SITE_OTHER): Payer: Medicare Other

## 2012-11-24 VITALS — BP 141/78 | HR 71 | Resp 16 | Ht 72.0 in | Wt 176.0 lb

## 2012-11-24 DIAGNOSIS — L608 Other nail disorders: Secondary | ICD-10-CM

## 2012-11-24 DIAGNOSIS — E1142 Type 2 diabetes mellitus with diabetic polyneuropathy: Secondary | ICD-10-CM

## 2012-11-24 DIAGNOSIS — Q828 Other specified congenital malformations of skin: Secondary | ICD-10-CM

## 2012-11-24 DIAGNOSIS — E1149 Type 2 diabetes mellitus with other diabetic neurological complication: Secondary | ICD-10-CM

## 2012-11-24 NOTE — Progress Notes (Signed)
  Subjective:    Patient ID: Nathan [name], male    DOB: 29-May-1933, 79 y.o.   MRN: [id number]  HPI Comments: "I need to get my toenails and calluses trimmed today"  no changes in health or medication history since last here. Having some slight discomfort with his knee shoes sub-first MTP bilateral. Continues to form slight keratosis first MTP bilateral. Most likely associated with break in period of his knee shoes.    Review of Systems  All other systems reviewed and are negative.       Objective:   Physical Exam  Vitals reviewed. Constitutional: He is oriented to person, place, and time. He appears well-developed and well-nourished.  Cardiovascular:  Pulses:      Dorsalis pedis pulses are 2+ on the right side, and 2+ on the left side.       Posterior tibial pulses are 1+ on the right side, and 1+ on the left side.  Capillary refill 3-4 seconds all digits bilateral skin temperature warm  Musculoskeletal: Normal range of motion.  Plantar flexed first metatarsal with atrophy of fat pad bilateral. Rectus toes and hallux bilateral. Mild flexible contractures noted.  Neurological: He is alert and oriented to person, place, and time. He has normal strength and normal reflexes.  Epicritic sensation diminished on Semmes Weinstein testing to the forefoot and digits bilateral DTRs not elicited  Skin: Skin is warm and dry. No cyanosis. Nails show no clubbing.  Skin color and pigment are normal hair growth is absent bilateral. Nails friable criptotic with discoloration and incurvation one through 5 bilateral  Psychiatric: He has a normal mood and affect. Judgment and thought content normal.          Assessment & Plan:  Diabetes with peripheral neuropathy contributing factors, dystrophic nails and multiple keratoses secondary to plantar flexed metatarsals. Nail debridement x10 the presence of diabetes and applications. Debridement of keratoses x2. Examine shoes note insoles or accommodating  well. Followup in 3 months for continued palliative care  Harriet Masson DPM

## 2012-11-24 NOTE — Patient Instructions (Signed)

## 2012-11-27 DIAGNOSIS — IMO0002 Reserved for concepts with insufficient information to code with codable children: Secondary | ICD-10-CM | POA: Diagnosis not present

## 2012-11-27 DIAGNOSIS — E1129 Type 2 diabetes mellitus with other diabetic kidney complication: Secondary | ICD-10-CM | POA: Diagnosis not present

## 2012-11-27 DIAGNOSIS — I359 Nonrheumatic aortic valve disorder, unspecified: Secondary | ICD-10-CM | POA: Diagnosis not present

## 2012-11-27 DIAGNOSIS — Z23 Encounter for immunization: Secondary | ICD-10-CM | POA: Diagnosis not present

## 2012-11-27 DIAGNOSIS — I10 Essential (primary) hypertension: Secondary | ICD-10-CM | POA: Diagnosis not present

## 2012-12-06 DIAGNOSIS — H2510 Age-related nuclear cataract, unspecified eye: Secondary | ICD-10-CM | POA: Diagnosis not present

## 2013-01-16 ENCOUNTER — Ambulatory Visit (INDEPENDENT_AMBULATORY_CARE_PROVIDER_SITE_OTHER): Payer: Medicare Other | Admitting: Cardiovascular Disease

## 2013-01-16 ENCOUNTER — Encounter: Payer: Self-pay | Admitting: Cardiovascular Disease

## 2013-01-16 VITALS — BP 144/78 | HR 60 | Ht 72.0 in | Wt 182.0 lb

## 2013-01-16 DIAGNOSIS — I2510 Atherosclerotic heart disease of native coronary artery without angina pectoris: Secondary | ICD-10-CM | POA: Diagnosis not present

## 2013-01-16 DIAGNOSIS — E7800 Pure hypercholesterolemia, unspecified: Secondary | ICD-10-CM

## 2013-01-16 NOTE — Patient Instructions (Signed)
Your physician recommends that you return for lab work in: today bmet lipid liver and in 1 year   Your physician wants you to follow-up in: 1 year  You will receive a reminder letter in the mail two months in advance. If you don't receive a letter, please call our office to schedule the follow-up appointment.

## 2013-01-16 NOTE — Assessment & Plan Note (Signed)
Nathan [name] is  doing well. He's not had any episodes of chest pain or shortness breath. He's able to do all of his normal activities without any significant problems.  We'll continue the same medications. I'll see him again in one year. We'll check fasting labs today and again when I see him in one year. We'll do an EKG in one year.

## 2013-01-16 NOTE — Progress Notes (Signed)
[name] Date of Birth  19-Feb-1933       [facility]    [employer] [address], [place], [place]  [address]   [place], [place]  [address] [phone]     [phone]   Fax  [phone]    Fax [phone]  Problem List: 1. Coronary artery disease-status post CABG 2. Aortic insufficiency 3. Hypercholesterolemia 4. Diabetes mellitus 5. Mitral regurgitation  History of Present Illness:  Mr. Mcconnel is doing very well. He's not had any episodes of chest pain or shortness of breath. 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  Dec. 2, 2014:  [name] is doing well.  Not as much energy.   No CP or dyspnea.    Current Outpatient Prescriptions on File Prior to Visit  Medication Sig Dispense Refill  . amLODipine (NORVASC) 5 MG tablet Take 5 mg by mouth daily.        [name] aspirin 81 MG tablet Take 81 mg by mouth daily.      [name] atenolol (TENORMIN) 50 MG tablet Take 100 mg by mouth daily.       [name] glipiZIDE (GLUCOTROL) 10 MG tablet Take 10 mg by mouth 2 (two) times daily before a meal.        . metFORMIN (GLUCOPHAGE) 500 MG tablet Take 500 mg by mouth. Taking 2 in AM and 2 in PM      . multivitamin (THERAGRAN) per tablet Take 1 tablet by mouth daily.        . Omega-3 Fatty Acids (FISH OIL) 1200 MG CAPS Take 3 capsules by mouth daily.       [name] omeprazole (PRILOSEC) 20 MG capsule Take 20 mg by mouth daily.       . simvastatin (ZOCOR) 20 MG tablet Take 20 mg by mouth at bedtime.        . tamsulosin (FLOMAX) 0.4 MG CAPS capsule Take 0.4 mg by mouth at bedtime.       No current facility-administered medications on file prior to visit.    No Known Allergies  Past Medical History  Diagnosis Date  . Hyperlipidemia   . Coronary artery disease 2002    CABG x5  . Diabetes mellitus   . Hypercholesteremia   . Mitral valve regurgitation   . Aortic insufficiency   . Skin cancer    tumor removed off of his back  . Acid reflux   . DVT (deep venous thrombosis)     previously on coumadin    Past Surgical History  Procedure Laterality Date  . Coronary artery bypass graft  12/21/2000    x5  . Tumor removal      off of back, skin cancer  . Skin cancer excision      History  Smoking status  . Never Smoker   Smokeless tobacco  . Not on file    History  Alcohol Use No    Family History  Problem Relation Age of Onset  . Heart attack Father   . Hypertension Mother   . Diabetes Brother   . Cancer Sister     breast  . Cancer Brother     throat & mouth    Reviw of Systems:  Reviewed in the HPI.  All other systems are negative.  Physical Exam: Blood pressure 144/78, pulse 60, height 6' (1.829 m), weight 182 lb (82.555 kg). General:  Well developed, well nourished, in no acute distress.  Head: Normocephalic, atraumatic, sclera non-icteric, mucus membranes are moist,   Neck: Supple. Carotids are 2 + without bruits. No JVD  Lungs: Clear bilaterally to auscultation.  Heart: regular rate.  normal  S1 S2. No murmurs, gallops or rubs.  Abdomen: Soft, non-tender, non-distended with normal bowel sounds. No hepatomegaly. No rebound/guarding. No masses.  Msk:  Strength and tone are normal  Extremities: No clubbing or cyanosis. No edema.  Distal pedal pulses are 2+ and equal bilaterally.  Neuro: Alert and oriented X 3. Moves all extremities spontaneously.  Psych:  Responds to questions appropriately with a normal affect.  ECG: January 16, 2013: Normal sinus rhythm at 60 beats a minute. He has a first degree AV block. He has an old anteroseptal myocardial infarction. There no changes from previous tracings.  Assessment / Plan:

## 2013-01-17 LAB — HEPATIC FUNCTION PANEL
Alkaline Phosphatase: 39 U/L (ref 39–117)
Bilirubin, Direct: 0.1 mg/dL (ref 0.0–0.3)
Total Bilirubin: 0.5 mg/dL (ref 0.3–1.2)

## 2013-01-17 LAB — BASIC METABOLIC PANEL
BUN: 22 mg/dL (ref 6–23)
CO2: 23 mEq/L (ref 19–32)
Glucose, Bld: 95 mg/dL (ref 70–99)
Potassium: 4.3 mEq/L (ref 3.5–5.1)
Sodium: 140 mEq/L (ref 135–145)

## 2013-01-17 LAB — LIPID PANEL
HDL: 32.3 mg/dL — ABNORMAL LOW (ref >39.00)
VLDL: 59.2 mg/dL — ABNORMAL HIGH (ref 0.0–40.0)

## 2013-01-19 ENCOUNTER — Telehealth: Payer: Self-pay | Admitting: Cardiovascular Disease

## 2013-01-19 MED ORDER — FENOFIBRATE 160 MG PO TABS: 160.0000 mg | ORAL_TABLET | Freq: Every day | ORAL | Status: DC

## 2013-01-19 NOTE — Telephone Encounter (Signed)
Spoke with patient.  Reviewed lab results and plan of care with patient including low fat/low cholesterol diet and increasing water intake to 64 oz daily.  Rx for fenofibrate 160 mg daily sent to patient's mail order pharmacy.  Patient verbalized understanding of all instructions.

## 2013-01-19 NOTE — Telephone Encounter (Signed)
New message ° ° ° ° °Returned a nurses call °

## 2013-03-02 ENCOUNTER — Ambulatory Visit (INDEPENDENT_AMBULATORY_CARE_PROVIDER_SITE_OTHER): Payer: Medicare Other

## 2013-03-02 VITALS — BP 135/75 | HR 70 | Resp 16 | Ht 72.0 in | Wt 175.0 lb

## 2013-03-02 DIAGNOSIS — E1142 Type 2 diabetes mellitus with diabetic polyneuropathy: Secondary | ICD-10-CM | POA: Diagnosis not present

## 2013-03-02 DIAGNOSIS — Q828 Other specified congenital malformations of skin: Secondary | ICD-10-CM | POA: Diagnosis not present

## 2013-03-02 DIAGNOSIS — L608 Other nail disorders: Secondary | ICD-10-CM | POA: Diagnosis not present

## 2013-03-02 DIAGNOSIS — E1149 Type 2 diabetes mellitus with other diabetic neurological complication: Secondary | ICD-10-CM | POA: Diagnosis not present

## 2013-03-02 NOTE — Progress Notes (Signed)
   Subjective:    Patient ID: Nathan [name], male    DOB: 1933-05-08, 81 y.o.   MRN: [id number]  "He just clips my nails and looks at my feet."  HPI    Review of Systems no new changes or findings     Objective:   Physical Exam Masker status is intact with pedal pulses palpable DP +2/4 bilateral PT plus one over 4 bilateral Refill timed 3-4 seconds all digits skin temperature is warm turgor normal no edema rubor pallor or varicosities noted. Neurologically epicritic and proprioceptive sensations diminished on Semmes Weinstein testing to distal digits and plantar forefoot there is normal plantar response DTRs not elicited. Dermatologically skin color pigment normal hair growth absent nails criptotic incurvated and slightly friable debrided 1 through 5 bilateral. Patient also has some mild diffuse keratoses sub-first MTP area bilateral secondary plantigrade metatarsal. No open wounds ulcerations no signs of infection current time. Otherwise rectus foot. Patient ambulating appropriate Oxford type shoes.       Assessment & Plan:  Assessment diabetes with peripheral neuropathy plan at this time debridement of dystrophic friable gratified nails 1 through 5 bilateral also debridement multiple keratoses sub-first MTP area bilateral reappointed in an as-needed basis for followup and palliative care suggest 3 month followup  Harriet Masson DPM

## 2013-03-02 NOTE — Patient Instructions (Signed)
Diabetes and Foot Care Diabetes may cause you to have problems because of poor blood supply (circulation) to your feet and legs. This may cause the skin on your feet to become thinner, break easier, and heal more slowly. Your skin may become dry, and the skin may peel and crack. You may also have nerve damage in your legs and feet causing decreased feeling in them. You may not notice minor injuries to your feet that could lead to infections or more serious problems. Taking care of your feet is one of the most important things you can do for yourself.  HOME CARE INSTRUCTIONS  Wear shoes at all times, even in the house. Do not go barefoot. Bare feet are easily injured.  Check your feet daily for blisters, cuts, and redness. If you cannot see the bottom of your feet, use a mirror or ask someone for help.  Wash your feet with warm water (do not use hot water) and mild soap. Then pat your feet and the areas between your toes until they are completely dry. Do not soak your feet as this can dry your skin.  Apply a moisturizing lotion or petroleum jelly (that does not contain alcohol and is unscented) to the skin on your feet and to dry, brittle toenails. Do not apply lotion between your toes.  Trim your toenails straight across. Do not dig under them or around the cuticle. File the edges of your nails with an emery board or nail file.  Do not cut corns or calluses or try to remove them with medicine.  Wear clean socks or stockings every day. Make sure they are not too tight. Do not wear knee-high stockings since they may decrease blood flow to your legs.  Wear shoes that fit properly and have enough cushioning. To break in new shoes, wear them for just a few hours a day. This prevents you from injuring your feet. Always look in your shoes before you put them on to be sure there are no objects inside.  Do not cross your legs. This may decrease the blood flow to your feet.  If you find a minor scrape,  cut, or break in the skin on your feet, keep it and the skin around it clean and dry. These areas may be cleansed with mild soap and water. Do not cleanse the area with peroxide, alcohol, or iodine.  When you remove an adhesive bandage, be sure not to damage the skin around it.  If you have a wound, look at it several times a day to make sure it is healing.  Do not use heating pads or hot water bottles. They may burn your skin. If you have lost feeling in your feet or legs, you may not know it is happening until it is too late.  Make sure your health care provider performs a complete foot exam at least annually or more often if you have foot problems. Report any cuts, sores, or bruises to your health care provider immediately. SEEK MEDICAL CARE IF:   You have an injury that is not healing.  You have cuts or breaks in the skin.  You have an ingrown nail.  You notice redness on your legs or feet.  You feel burning or tingling in your legs or feet.  You have pain or cramps in your legs and feet.  Your legs or feet are numb.  Your feet always feel cold. SEEK IMMEDIATE MEDICAL CARE IF:   There is increasing redness,   swelling, or pain in or around a wound.  There is a red line that goes up your leg.  Pus is coming from a wound.  You develop a fever or as directed by your health care provider.  You notice a bad smell coming from an ulcer or wound. Document Released: 01/30/2000 Document Revised: 10/04/2012 Document Reviewed: 07/11/2012 [employer] Patient Information 2014 [employer].  

## 2013-03-05 DIAGNOSIS — E1165 Type 2 diabetes mellitus with hyperglycemia: Secondary | ICD-10-CM | POA: Diagnosis not present

## 2013-03-05 DIAGNOSIS — I10 Essential (primary) hypertension: Secondary | ICD-10-CM | POA: Diagnosis not present

## 2013-03-05 DIAGNOSIS — IMO0002 Reserved for concepts with insufficient information to code with codable children: Secondary | ICD-10-CM | POA: Diagnosis not present

## 2013-03-05 DIAGNOSIS — I359 Nonrheumatic aortic valve disorder, unspecified: Secondary | ICD-10-CM | POA: Diagnosis not present

## 2013-03-05 DIAGNOSIS — E1129 Type 2 diabetes mellitus with other diabetic kidney complication: Secondary | ICD-10-CM | POA: Diagnosis not present

## 2013-03-05 DIAGNOSIS — N183 Chronic kidney disease, stage 3 unspecified: Secondary | ICD-10-CM | POA: Diagnosis not present

## 2013-05-28 DIAGNOSIS — Z125 Encounter for screening for malignant neoplasm of prostate: Secondary | ICD-10-CM | POA: Diagnosis not present

## 2013-05-28 DIAGNOSIS — E785 Hyperlipidemia, unspecified: Secondary | ICD-10-CM | POA: Diagnosis not present

## 2013-05-28 DIAGNOSIS — E1129 Type 2 diabetes mellitus with other diabetic kidney complication: Secondary | ICD-10-CM | POA: Diagnosis not present

## 2013-05-28 DIAGNOSIS — I10 Essential (primary) hypertension: Secondary | ICD-10-CM | POA: Diagnosis not present

## 2013-05-29 ENCOUNTER — Ambulatory Visit (INDEPENDENT_AMBULATORY_CARE_PROVIDER_SITE_OTHER): Payer: Medicare Other

## 2013-05-29 ENCOUNTER — Ambulatory Visit: Payer: Medicare Other

## 2013-05-29 VITALS — BP 140/66 | HR 73 | Resp 16

## 2013-05-29 DIAGNOSIS — E1149 Type 2 diabetes mellitus with other diabetic neurological complication: Secondary | ICD-10-CM

## 2013-05-29 DIAGNOSIS — L608 Other nail disorders: Secondary | ICD-10-CM | POA: Diagnosis not present

## 2013-05-29 DIAGNOSIS — Q828 Other specified congenital malformations of skin: Secondary | ICD-10-CM

## 2013-05-29 DIAGNOSIS — E1142 Type 2 diabetes mellitus with diabetic polyneuropathy: Secondary | ICD-10-CM

## 2013-05-29 NOTE — Patient Instructions (Signed)
Diabetes and Foot Care Diabetes may cause you to have problems because of poor blood supply (circulation) to your feet and legs. This may cause the skin on your feet to become thinner, break easier, and heal more slowly. Your skin may become dry, and the skin may peel and crack. You may also have nerve damage in your legs and feet causing decreased feeling in them. You may not notice minor injuries to your feet that could lead to infections or more serious problems. Taking care of your feet is one of the most important things you can do for yourself.  HOME CARE INSTRUCTIONS  Wear shoes at all times, even in the house. Do not go barefoot. Bare feet are easily injured.  Check your feet daily for blisters, cuts, and redness. If you cannot see the bottom of your feet, use a mirror or ask someone for help.  Wash your feet with warm water (do not use hot water) and mild soap. Then pat your feet and the areas between your toes until they are completely dry. Do not soak your feet as this can dry your skin.  Apply a moisturizing lotion or petroleum jelly (that does not contain alcohol and is unscented) to the skin on your feet and to dry, brittle toenails. Do not apply lotion between your toes.  Trim your toenails straight across. Do not dig under them or around the cuticle. File the edges of your nails with an emery board or nail file.  Do not cut corns or calluses or try to remove them with medicine.  Wear clean socks or stockings every day. Make sure they are not too tight. Do not wear knee-high stockings since they may decrease blood flow to your legs.  Wear shoes that fit properly and have enough cushioning. To break in new shoes, wear them for just a few hours a day. This prevents you from injuring your feet. Always look in your shoes before you put them on to be sure there are no objects inside.  Do not cross your legs. This may decrease the blood flow to your feet.  If you find a minor scrape,  cut, or break in the skin on your feet, keep it and the skin around it clean and dry. These areas may be cleansed with mild soap and water. Do not cleanse the area with peroxide, alcohol, or iodine.  When you remove an adhesive bandage, be sure not to damage the skin around it.  If you have a wound, look at it several times a day to make sure it is healing.  Do not use heating pads or hot water bottles. They may burn your skin. If you have lost feeling in your feet or legs, you may not know it is happening until it is too late.  Make sure your health care provider performs a complete foot exam at least annually or more often if you have foot problems. Report any cuts, sores, or bruises to your health care provider immediately. SEEK MEDICAL CARE IF:   You have an injury that is not healing.  You have cuts or breaks in the skin.  You have an ingrown nail.  You notice redness on your legs or feet.  You feel burning or tingling in your legs or feet.  You have pain or cramps in your legs and feet.  Your legs or feet are numb.  Your feet always feel cold. SEEK IMMEDIATE MEDICAL CARE IF:   There is increasing redness,   swelling, or pain in or around a wound.  There is a red line that goes up your leg.  Pus is coming from a wound.  You develop a fever or as directed by your health care provider.  You notice a bad smell coming from an ulcer or wound. Document Released: 01/30/2000 Document Revised: 10/04/2012 Document Reviewed: 07/11/2012 [employer] Patient Information 2014 [employer].  

## 2013-05-29 NOTE — Progress Notes (Signed)
   Subjective:    Patient ID: Nathan [name], male    DOB: 1933/03/05, 80 y.o.   MRN: [id number]  HPI Comments: "I need him to trim my toenails"  Patient states that he wants to get diabetic shoes and insoles for this year. Dr Philip Aspen treats his diabetes.     Review of Systems no systemic changes or findings noted     Objective:   Physical Exam 80 year old white male consistent for diabetic foot and nail care well-developed well-nourished oriented x3 no changes other than he stop taking his medicines I cannot agree with his stomach and not even in the medicine could remember. At this time lower extremity objective findings as follows DP postal for PT pulses one over 4 bilateral mild varicosities noted temperature warm to cool turgor is diminished there is no edema no rubor pallor no varicosities neurologic epicritic and proprioceptive sensations diminished on Semmes Weinstein testing the forefoot toes and mid arch. There is normal plantar response DTRs not elicited dermatologically skin color pigment normal hair growth absent nails criptotic incurvated friable with some discoloration 1 through 5 bilateral there is mild diffuse keratoses sub-first bilateral history of previous keratoses however with debridement and diabetic shoe wear and insoles and keratoses are minimal at this time no open wounds or ulcers no secondary infections at current time patient is due for placement of a shoes are worn and almost a year obtain authorization for new diabetic shoes at this time Dr. Philip Aspen is treated diabetes treating physician.       Assessment & Plan:  Assessment diabetes with peripheral neuropathy history keratoses secondary digital contractures bunion deformity as well as thick brittle friable gratified nails which are debrided x10 at this time. Return with the next 3 months for continued palliative care was we'll obtain authorization for new diabetic shoes and schedule followup appointment for  measurement and impressions for custom orthotics in new diabetic extra-depth shoes. Next  Harriet Masson DPM

## 2013-06-04 ENCOUNTER — Other Ambulatory Visit: Payer: Self-pay | Admitting: Internal Medicine

## 2013-06-04 DIAGNOSIS — E1129 Type 2 diabetes mellitus with other diabetic kidney complication: Secondary | ICD-10-CM | POA: Diagnosis not present

## 2013-06-04 DIAGNOSIS — N183 Chronic kidney disease, stage 3 unspecified: Secondary | ICD-10-CM

## 2013-06-04 DIAGNOSIS — Z1212 Encounter for screening for malignant neoplasm of rectum: Secondary | ICD-10-CM | POA: Diagnosis not present

## 2013-06-04 DIAGNOSIS — I10 Essential (primary) hypertension: Secondary | ICD-10-CM | POA: Diagnosis not present

## 2013-06-04 DIAGNOSIS — Z125 Encounter for screening for malignant neoplasm of prostate: Secondary | ICD-10-CM | POA: Diagnosis not present

## 2013-06-04 DIAGNOSIS — R748 Abnormal levels of other serum enzymes: Secondary | ICD-10-CM

## 2013-06-04 DIAGNOSIS — Z0000 Encounter for general adult medical examination without abnormal findings: Secondary | ICD-10-CM | POA: Diagnosis not present

## 2013-06-04 DIAGNOSIS — R7402 Elevation of levels of lactic acid dehydrogenase (LDH): Secondary | ICD-10-CM | POA: Diagnosis not present

## 2013-06-04 DIAGNOSIS — E785 Hyperlipidemia, unspecified: Secondary | ICD-10-CM | POA: Diagnosis not present

## 2013-06-04 DIAGNOSIS — Z1331 Encounter for screening for depression: Secondary | ICD-10-CM | POA: Diagnosis not present

## 2013-06-04 DIAGNOSIS — E1165 Type 2 diabetes mellitus with hyperglycemia: Secondary | ICD-10-CM | POA: Diagnosis not present

## 2013-06-04 DIAGNOSIS — R972 Elevated prostate specific antigen [PSA]: Secondary | ICD-10-CM | POA: Diagnosis not present

## 2013-06-06 DIAGNOSIS — H52229 Regular astigmatism, unspecified eye: Secondary | ICD-10-CM | POA: Diagnosis not present

## 2013-06-06 DIAGNOSIS — H5200 Hypermetropia, unspecified eye: Secondary | ICD-10-CM | POA: Diagnosis not present

## 2013-06-06 DIAGNOSIS — D0410 Carcinoma in situ of skin of unspecified eyelid, including canthus: Secondary | ICD-10-CM | POA: Diagnosis not present

## 2013-06-06 DIAGNOSIS — E119 Type 2 diabetes mellitus without complications: Secondary | ICD-10-CM | POA: Diagnosis not present

## 2013-06-08 ENCOUNTER — Ambulatory Visit
Admission: RE | Admit: 2013-06-08 | Discharge: 2013-06-08 | Disposition: A | Discharge status: Home / Self Care | Payer: Medicare Other | Source: Ambulatory Visit | Attending: Internal Medicine | Admitting: Internal Medicine

## 2013-06-08 ENCOUNTER — Other Ambulatory Visit: Payer: Medicare Other

## 2013-06-08 DIAGNOSIS — R7401 Elevation of levels of liver transaminase levels: Secondary | ICD-10-CM | POA: Diagnosis not present

## 2013-06-08 DIAGNOSIS — N183 Chronic kidney disease, stage 3 unspecified: Secondary | ICD-10-CM

## 2013-06-08 DIAGNOSIS — R7402 Elevation of levels of lactic acid dehydrogenase (LDH): Secondary | ICD-10-CM | POA: Diagnosis not present

## 2013-06-08 DIAGNOSIS — R748 Abnormal levels of other serum enzymes: Secondary | ICD-10-CM

## 2013-09-07 ENCOUNTER — Ambulatory Visit: Payer: Medicare Other

## 2013-09-14 DIAGNOSIS — I10 Essential (primary) hypertension: Secondary | ICD-10-CM | POA: Diagnosis not present

## 2013-09-14 DIAGNOSIS — N183 Chronic kidney disease, stage 3 unspecified: Secondary | ICD-10-CM | POA: Diagnosis not present

## 2013-09-14 DIAGNOSIS — E1129 Type 2 diabetes mellitus with other diabetic kidney complication: Secondary | ICD-10-CM | POA: Diagnosis not present

## 2013-09-14 DIAGNOSIS — I359 Nonrheumatic aortic valve disorder, unspecified: Secondary | ICD-10-CM | POA: Diagnosis not present

## 2013-09-14 DIAGNOSIS — IMO0002 Reserved for concepts with insufficient information to code with codable children: Secondary | ICD-10-CM | POA: Diagnosis not present

## 2013-09-18 ENCOUNTER — Ambulatory Visit (INDEPENDENT_AMBULATORY_CARE_PROVIDER_SITE_OTHER): Payer: Medicare Other

## 2013-09-18 VITALS — BP 159/74 | HR 65 | Resp 15 | Ht 72.0 in | Wt 178.0 lb

## 2013-09-18 DIAGNOSIS — E1149 Type 2 diabetes mellitus with other diabetic neurological complication: Secondary | ICD-10-CM | POA: Diagnosis not present

## 2013-09-18 DIAGNOSIS — L608 Other nail disorders: Secondary | ICD-10-CM

## 2013-09-18 DIAGNOSIS — E1142 Type 2 diabetes mellitus with diabetic polyneuropathy: Secondary | ICD-10-CM | POA: Diagnosis not present

## 2013-09-18 DIAGNOSIS — Q828 Other specified congenital malformations of skin: Secondary | ICD-10-CM

## 2013-09-18 NOTE — Progress Notes (Signed)
   Subjective:    Patient ID: Nathan [name], male    DOB: [birth year], 80 y.o.   MRN: [id number]  HPI Comments: Pt states he would like his toenails trimmed.  Pt commented that his legs looked swollen to him.     Review of Systems no new findings for systemic changes noted other than patient was taken at the side and his A1c is for went from a 6 to ninths and he may start on insulin next month to     Objective:   Physical Exam Lower extremity objective findings reveal pedal pulses palpable DP +2/4 PT one over 4 bilateral capillary refill time 3 seconds. Epicritic and proprioceptive sensations diminished on Semmes Weinstein testing to the forefoot and digits. Slight pinch callus of the hallux noted although no severe keratoses nails thick brittle crumbly friable dystrophic 1 through 5 bilateral. No open wounds ulcerations no secondary infection is noted at this time.       Assessment & Plan:  Assessment diabetes with peripheral neuropathy. Nails debrided 1 through 5 bilateral also diffuse keratoses are identified however no need for debridement at this time is getting authorization for diabetic shoes and near future followup in 3 months for continued palliative care is needed next  [employer] DPM

## 2013-09-18 NOTE — Patient Instructions (Signed)
Diabetes and Foot Care Diabetes may cause you to have problems because of poor blood supply (circulation) to your feet and legs. This may cause the skin on your feet to become thinner, break easier, and heal more slowly. Your skin may become dry, and the skin may peel and crack. You may also have nerve damage in your legs and feet causing decreased feeling in them. You may not notice minor injuries to your feet that could lead to infections or more serious problems. Taking care of your feet is one of the most important things you can do for yourself.  HOME CARE INSTRUCTIONS  Wear shoes at all times, even in the house. Do not go barefoot. Bare feet are easily injured.  Check your feet daily for blisters, cuts, and redness. If you cannot see the bottom of your feet, use a mirror or ask someone for help.  Wash your feet with warm water (do not use hot water) and mild soap. Then pat your feet and the areas between your toes until they are completely dry. Do not soak your feet as this can dry your skin.  Apply a moisturizing lotion or petroleum jelly (that does not contain alcohol and is unscented) to the skin on your feet and to dry, brittle toenails. Do not apply lotion between your toes.  Trim your toenails straight across. Do not dig under them or around the cuticle. File the edges of your nails with an emery board or nail file.  Do not cut corns or calluses or try to remove them with medicine.  Wear clean socks or stockings every day. Make sure they are not too tight. Do not wear knee-high stockings since they may decrease blood flow to your legs.  Wear shoes that fit properly and have enough cushioning. To break in new shoes, wear them for just a few hours a day. This prevents you from injuring your feet. Always look in your shoes before you put them on to be sure there are no objects inside.  Do not cross your legs. This may decrease the blood flow to your feet.  If you find a minor scrape,  cut, or break in the skin on your feet, keep it and the skin around it clean and dry. These areas may be cleansed with mild soap and water. Do not cleanse the area with peroxide, alcohol, or iodine.  When you remove an adhesive bandage, be sure not to damage the skin around it.  If you have a wound, look at it several times a day to make sure it is healing.  Do not use heating pads or hot water bottles. They may burn your skin. If you have lost feeling in your feet or legs, you may not know it is happening until it is too late.  Make sure your health care provider performs a complete foot exam at least annually or more often if you have foot problems. Report any cuts, sores, or bruises to your health care provider immediately. SEEK MEDICAL CARE IF:   You have an injury that is not healing.  You have cuts or breaks in the skin.  You have an ingrown nail.  You notice redness on your legs or feet.  You feel burning or tingling in your legs or feet.  You have pain or cramps in your legs and feet.  Your legs or feet are numb.  Your feet always feel cold. SEEK IMMEDIATE MEDICAL CARE IF:   There is increasing redness,   swelling, or pain in or around a wound.  There is a red line that goes up your leg.  Pus is coming from a wound.  You develop a fever or as directed by your health care provider.  You notice a bad smell coming from an ulcer or wound. Document Released: 01/30/2000 Document Revised: 10/04/2012 Document Reviewed: 07/11/2012 Exit[employer] Patient Information 2015 [employer]. This information is not intended to replace advice given to you by your health care provider. Make sure you discuss any questions you have with your health care provider.  

## 2013-09-26 DIAGNOSIS — C44211 Basal cell carcinoma of skin of unspecified ear and external auricular canal: Secondary | ICD-10-CM | POA: Diagnosis not present

## 2013-09-26 DIAGNOSIS — L570 Actinic keratosis: Secondary | ICD-10-CM | POA: Diagnosis not present

## 2013-12-14 DIAGNOSIS — E1121 Type 2 diabetes mellitus with diabetic nephropathy: Secondary | ICD-10-CM | POA: Diagnosis not present

## 2013-12-14 DIAGNOSIS — I351 Nonrheumatic aortic (valve) insufficiency: Secondary | ICD-10-CM | POA: Diagnosis not present

## 2013-12-14 DIAGNOSIS — E1129 Type 2 diabetes mellitus with other diabetic kidney complication: Secondary | ICD-10-CM | POA: Diagnosis not present

## 2013-12-14 DIAGNOSIS — Z23 Encounter for immunization: Secondary | ICD-10-CM | POA: Diagnosis not present

## 2013-12-14 DIAGNOSIS — Z6824 Body mass index (BMI) 24.0-24.9, adult: Secondary | ICD-10-CM | POA: Diagnosis not present

## 2013-12-14 DIAGNOSIS — I10 Essential (primary) hypertension: Secondary | ICD-10-CM | POA: Diagnosis not present

## 2013-12-18 ENCOUNTER — Ambulatory Visit: Payer: Medicare Other

## 2013-12-18 ENCOUNTER — Ambulatory Visit (INDEPENDENT_AMBULATORY_CARE_PROVIDER_SITE_OTHER): Payer: Medicare Other

## 2013-12-18 DIAGNOSIS — B351 Tinea unguium: Secondary | ICD-10-CM

## 2013-12-18 DIAGNOSIS — E1140 Type 2 diabetes mellitus with diabetic neuropathy, unspecified: Secondary | ICD-10-CM

## 2013-12-18 DIAGNOSIS — E1142 Type 2 diabetes mellitus with diabetic polyneuropathy: Secondary | ICD-10-CM

## 2013-12-18 DIAGNOSIS — M79673 Pain in unspecified foot: Secondary | ICD-10-CM

## 2013-12-18 NOTE — Progress Notes (Signed)
   Subjective:    Patient ID: Sebastyn Tiedt, male    DOB: 1933/11/12, 80 y.o.   MRN: [id number]  HPI  Patient presents for nail debrdement  Review of Systems no new findings or systemic changes noted     Objective:   Physical Exam Neurovascular status is intact DP +2 PT 1 over 4 bilateral Refill 3 seconds epicritic sensations diminished to forefoot digits and arch there is pinch callus of the hallux bilateral and the first MTP area however no open wounds no ulcers no secondary infections nails thick brittle crumbly dystrophic friable 1 through 5 bilateral painful tender both on palpation and with enclosed shoe wear       Assessment & Plan:  Assessment for diabetes with history peripheral neuropathy patient is a keratoses which is debrided this time also multiple nails 1 through 5 bilateral debrided still try to get authorization for diabetic extra-depth shoes months for continued palliative care is needed  Harriet Masson DPM

## 2013-12-20 DIAGNOSIS — E1121 Type 2 diabetes mellitus with diabetic nephropathy: Secondary | ICD-10-CM | POA: Diagnosis not present

## 2013-12-20 DIAGNOSIS — I10 Essential (primary) hypertension: Secondary | ICD-10-CM | POA: Diagnosis not present

## 2013-12-20 DIAGNOSIS — Z6824 Body mass index (BMI) 24.0-24.9, adult: Secondary | ICD-10-CM | POA: Diagnosis not present

## 2013-12-20 DIAGNOSIS — N183 Chronic kidney disease, stage 3 (moderate): Secondary | ICD-10-CM | POA: Diagnosis not present

## 2014-01-15 ENCOUNTER — Telehealth: Payer: Self-pay | Admitting: *Deleted

## 2014-01-15 NOTE — Telephone Encounter (Signed)
Pt asked if his doctor sent in paperwork for his diabetic shoes.

## 2014-01-17 ENCOUNTER — Other Ambulatory Visit (INDEPENDENT_AMBULATORY_CARE_PROVIDER_SITE_OTHER): Payer: Medicare Other | Admitting: *Deleted

## 2014-01-17 ENCOUNTER — Encounter: Payer: Self-pay | Admitting: Cardiovascular Disease

## 2014-01-17 ENCOUNTER — Ambulatory Visit (INDEPENDENT_AMBULATORY_CARE_PROVIDER_SITE_OTHER): Payer: Medicare Other | Admitting: Cardiovascular Disease

## 2014-01-17 VITALS — BP 144/82 | HR 67 | Ht 72.0 in | Wt 173.8 lb

## 2014-01-17 DIAGNOSIS — I2510 Atherosclerotic heart disease of native coronary artery without angina pectoris: Secondary | ICD-10-CM

## 2014-01-17 DIAGNOSIS — I38 Endocarditis, valve unspecified: Secondary | ICD-10-CM

## 2014-01-17 DIAGNOSIS — I340 Nonrheumatic mitral (valve) insufficiency: Secondary | ICD-10-CM | POA: Diagnosis not present

## 2014-01-17 DIAGNOSIS — E7800 Pure hypercholesterolemia, unspecified: Secondary | ICD-10-CM

## 2014-01-17 DIAGNOSIS — R55 Syncope and collapse: Secondary | ICD-10-CM | POA: Diagnosis not present

## 2014-01-17 DIAGNOSIS — I351 Nonrheumatic aortic (valve) insufficiency: Secondary | ICD-10-CM | POA: Diagnosis not present

## 2014-01-17 LAB — CBC WITH DIFFERENTIAL/PLATELET
BASOS ABS: 0 10*3/uL (ref 0.0–0.1)
BASOS PCT: 0.3 % (ref 0.0–3.0)
EOS ABS: 0.1 10*3/uL (ref 0.0–0.7)
Eosinophils Relative: 1.2 % (ref 0.0–5.0)
HCT: 39.9 % (ref 39.0–52.0)
HEMOGLOBIN: 13.2 g/dL (ref 13.0–17.0)
LYMPHS PCT: 20.7 % (ref 12.0–46.0)
Lymphs Abs: 2.4 10*3/uL (ref 0.7–4.0)
MCHC: 33 g/dL (ref 30.0–36.0)
MCV: 87 fl (ref 78.0–100.0)
MONO ABS: 0.9 10*3/uL (ref 0.1–1.0)
Monocytes Relative: 7.9 % (ref 3.0–12.0)
NEUTROS ABS: 8.1 10*3/uL — AB (ref 1.4–7.7)
NEUTROS PCT: 69.9 % (ref 43.0–77.0)
Platelets: 239 10*3/uL (ref 150.0–400.0)
RBC: 4.58 Mil/uL (ref 4.22–5.81)
RDW: 12.9 % (ref 11.5–15.5)
WBC: 11.6 10*3/uL — ABNORMAL HIGH (ref 4.0–10.5)

## 2014-01-17 LAB — BASIC METABOLIC PANEL
BUN: 20 mg/dL (ref 6–23)
CALCIUM: 9.3 mg/dL (ref 8.4–10.5)
CO2: 23 mEq/L (ref 19–32)
CREATININE: 1.5 mg/dL (ref 0.4–1.5)
Chloride: 107 mEq/L (ref 96–112)
GFR: 48.46 mL/min — AB (ref >60.00)
Glucose, Bld: 117 mg/dL — ABNORMAL HIGH (ref 70–99)
Potassium: 3.5 mEq/L (ref 3.5–5.1)
Sodium: 139 mEq/L (ref 135–145)

## 2014-01-17 LAB — LIPID PANEL
CHOLESTEROL: 143 mg/dL (ref 0–200)
HDL: 20 mg/dL — ABNORMAL LOW (ref >39.00)
LDL Cholesterol: 83 mg/dL (ref 0–99)
NonHDL: 123
TRIGLYCERIDES: 198 mg/dL — AB (ref 0.0–149.0)
Total CHOL/HDL Ratio: 7
VLDL: 39.6 mg/dL (ref 0.0–40.0)

## 2014-01-17 LAB — HEPATIC FUNCTION PANEL
ALK PHOS: 57 U/L (ref 39–117)
ALT: 29 U/L (ref 0–53)
AST: 26 U/L (ref 0–37)
Albumin: 3.5 g/dL (ref 3.5–5.2)
BILIRUBIN DIRECT: 0 mg/dL (ref 0.0–0.3)
BILIRUBIN TOTAL: 0.7 mg/dL (ref 0.2–1.2)
TOTAL PROTEIN: 7 g/dL (ref 6.0–8.3)

## 2014-01-17 NOTE — Assessment & Plan Note (Signed)
Will check labs today

## 2014-01-17 NOTE — Patient Instructions (Signed)
Your physician recommends that you continue on your current medications as directed. Please refer to the Current Medication list given to you today.  Your physician wants you to follow-up in: 1 year with Dr. Nahser.  You will receive a reminder letter in the mail two months in advance. If you don't receive a letter, please call our office to schedule the follow-up appointment.  

## 2014-01-17 NOTE — Progress Notes (Signed)
[name] Date of Birth  1934/02/08       [facility]    [employer] [address], [address], [place], [place]  [address]   [place], [place]  [address] [phone]     [phone]   Fax  [phone]    Fax [phone]  Problem List: 1. Coronary artery disease-status post CABG 2. Aortic insufficiency 3. Hypercholesterolemia 4. Diabetes mellitus 5. Mitral regurgitation  History of Present Illness:  Mr. Lelli is doing very well. He's not had any episodes of chest pain or shortness of breath. 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  Dec. 2, 2014:  Mr. Sartini is doing well.  Not as much energy.   No CP or dyspnea.    Dec. 3,2015:  Doing well from a cardiac standpoint.  started taking insulin recently.   Worked again for [name].     Current Outpatient Prescriptions on File Prior to Visit  Medication Sig Dispense Refill  . amLODipine (NORVASC) 5 MG tablet Take 5 mg by mouth daily.      [name] aspirin 81 MG tablet Take 81 mg by mouth daily.    [name] atenolol (TENORMIN) 50 MG tablet Take 100 mg by mouth daily.     . multivitamin (THERAGRAN) per tablet Take 1 tablet by mouth daily.      . Omega-3 Fatty Acids (FISH OIL) 1200 MG CAPS Take 3 capsules by mouth daily.     [name] omeprazole (PRILOSEC) 20 MG capsule Take 20 mg by mouth daily.     . pioglitazone (ACTOS) 15 MG tablet Take 15 mg by mouth daily.    . simvastatin (ZOCOR) 20 MG tablet Take 20 mg by mouth at bedtime.       No current facility-administered medications on file prior to visit.    No Known Allergies  Past Medical History  Diagnosis Date  . Hyperlipidemia   . Coronary artery disease 2002    CABG x5  . Diabetes mellitus   . Hypercholesteremia   . Mitral valve regurgitation   . Aortic insufficiency   . Skin cancer     tumor removed off of his back  . Acid reflux   . DVT (deep venous  thrombosis)     previously on coumadin    Past Surgical History  Procedure Laterality Date  . Coronary artery bypass graft  12/21/2000    x5  . Tumor removal      off of back, skin cancer  . Skin cancer excision      History  Smoking status  . Never Smoker   Smokeless tobacco  . Not on file    History  Alcohol Use No    Family History  Problem Relation Age of Onset  . Heart attack Father   . Hypertension Mother   . Diabetes Brother   . Cancer Sister     breast  . Cancer Brother     throat & mouth    Reviw of Systems:  Reviewed in the HPI.  All other systems are negative.  Physical Exam: Blood pressure 144/82, pulse 67, height 6' (1.829 m), weight 173 lb 12.8 oz (78.835 kg). General: Well developed, well nourished, in no acute distress.  Head: Normocephalic, atraumatic, sclera non-icteric, mucus membranes are moist,   Neck: Supple. Carotids are 2 + without bruits. No JVD  Lungs: Clear  bilaterally to auscultation.  Heart: regular rate.  normal  S1 S2. No murmurs, gallops or rubs.  Abdomen: Soft, non-tender, non-distended with normal bowel sounds. No hepatomegaly. No rebound/guarding. No masses.  Msk:  Strength and tone are normal  Extremities: No clubbing or cyanosis. No edema.  Distal pedal pulses are 2+ and equal bilaterally.  Neuro: Alert and oriented X 3. Moves all extremities spontaneously.  Psych:  Responds to questions appropriately with a normal affect.  ECG: Dec. 3, 2015:  NSR at 46 with 1st degree AV bloc  Occasional PVCs and PACs.   Assessment / Plan:

## 2014-01-17 NOTE — Assessment & Plan Note (Signed)
Nathan [name] seems to be fairly stable. He's not having any episodes of angina. His blood pressure is a little high today although he states it's typically in the normal range. He has mild renal insufficiency. He may need to be started on Lisinopril from his DM .   Will leave that up to his medical doctor.

## 2014-01-29 DIAGNOSIS — I10 Essential (primary) hypertension: Secondary | ICD-10-CM | POA: Diagnosis not present

## 2014-01-29 DIAGNOSIS — Z6824 Body mass index (BMI) 24.0-24.9, adult: Secondary | ICD-10-CM | POA: Diagnosis not present

## 2014-01-29 DIAGNOSIS — R351 Nocturia: Secondary | ICD-10-CM | POA: Diagnosis not present

## 2014-01-29 DIAGNOSIS — E1121 Type 2 diabetes mellitus with diabetic nephropathy: Secondary | ICD-10-CM | POA: Diagnosis not present

## 2014-02-26 ENCOUNTER — Ambulatory Visit (INDEPENDENT_AMBULATORY_CARE_PROVIDER_SITE_OTHER): Payer: PPO

## 2014-02-26 VITALS — BP 132/86 | HR 89 | Resp 12

## 2014-02-26 DIAGNOSIS — B351 Tinea unguium: Secondary | ICD-10-CM

## 2014-02-26 DIAGNOSIS — E1140 Type 2 diabetes mellitus with diabetic neuropathy, unspecified: Secondary | ICD-10-CM

## 2014-02-26 DIAGNOSIS — M79673 Pain in unspecified foot: Secondary | ICD-10-CM

## 2014-02-26 DIAGNOSIS — Q828 Other specified congenital malformations of skin: Secondary | ICD-10-CM

## 2014-02-26 DIAGNOSIS — M2040 Other hammer toe(s) (acquired), unspecified foot: Secondary | ICD-10-CM | POA: Diagnosis not present

## 2014-02-26 DIAGNOSIS — E1142 Type 2 diabetes mellitus with diabetic polyneuropathy: Secondary | ICD-10-CM

## 2014-02-26 NOTE — Patient Instructions (Signed)

## 2014-02-26 NOTE — Progress Notes (Signed)
   Subjective:    Patient ID: Nathan [name], male    DOB: Oct 17, 1933, 81 y.o.   MRN: [id number]  HPI  PUD AND GIVEN INSTRUCTION.  Review of Systems No new findings or systemic changes noted    Objective:   Physical Exam Neurovascular status unchanged pedal pulses DP +2 PT plus one over 4 bilateral Refill time 3 seconds. Epicritic and proprioceptive sensations diminished on Semmes Weinstein to the forefoot callusing is present due to digital contractures and hammertoe deformities. No open wounds or ulcers are noted       Assessment & Plan:  Assessment diabetes with complications include peripheral neuropathy and angiopathy and keratoses patient dispense one pair of extra-depth shoes 3 pairs of dual density Plastizote inlays which fit and contour well to the foot the shoes inlays have full contact to the foot appropriate fit in size oral and written instructions for shoewear and break in are given follow-up in 2-3 months for continued future palliative care is needed next  Harriet Masson DPM

## 2014-03-26 ENCOUNTER — Ambulatory Visit (INDEPENDENT_AMBULATORY_CARE_PROVIDER_SITE_OTHER): Payer: PPO

## 2014-03-26 DIAGNOSIS — E1142 Type 2 diabetes mellitus with diabetic polyneuropathy: Secondary | ICD-10-CM

## 2014-03-26 DIAGNOSIS — M79673 Pain in unspecified foot: Secondary | ICD-10-CM

## 2014-03-26 DIAGNOSIS — B351 Tinea unguium: Secondary | ICD-10-CM

## 2014-03-26 DIAGNOSIS — E1140 Type 2 diabetes mellitus with diabetic neuropathy, unspecified: Secondary | ICD-10-CM

## 2014-03-26 DIAGNOSIS — Q828 Other specified congenital malformations of skin: Secondary | ICD-10-CM

## 2014-03-26 DIAGNOSIS — M2040 Other hammer toe(s) (acquired), unspecified foot: Secondary | ICD-10-CM

## 2014-03-26 NOTE — Progress Notes (Signed)
   Subjective:    Patient ID: Nathan [name], male    DOB: 1933/03/13, 81 y.o.   MRN: [id number]  HPI patient presents this time for diabetic foot and nail care    Review of Systems no new findings or systemic changes noted     Objective:   Physical Exam Vascular status is intact DP +2 PT 1 over 4 bilateral capillary fill time 3 seconds all digits nails thick brittle crumbly criptotic incurvated 1 through 5 bilateral. There is decreased sensation Semmes Weinstein the forefoot digits and arch bilateral. No open wounds no ulcers no secondary infections patient shoes are working well no other complications at this time.       Assessment & Plan:  Assessment diabetes history peripheral neuropathy plan at this time dystrophic frontal mycotic nails 1 through 5 bilateral debrided return for future palliative care than shoes are helping prevent keratoses and further buildup however we'll monitor for nails and keratoses buildup within 3 months  Harriet Masson DPM

## 2014-07-02 ENCOUNTER — Ambulatory Visit (INDEPENDENT_AMBULATORY_CARE_PROVIDER_SITE_OTHER): Payer: PPO | Admitting: Podiatry

## 2014-07-02 DIAGNOSIS — Q828 Other specified congenital malformations of skin: Secondary | ICD-10-CM

## 2014-07-02 DIAGNOSIS — M79673 Pain in unspecified foot: Secondary | ICD-10-CM | POA: Diagnosis not present

## 2014-07-02 DIAGNOSIS — B351 Tinea unguium: Secondary | ICD-10-CM | POA: Diagnosis not present

## 2014-07-02 DIAGNOSIS — E1142 Type 2 diabetes mellitus with diabetic polyneuropathy: Secondary | ICD-10-CM

## 2014-07-02 DIAGNOSIS — E1140 Type 2 diabetes mellitus with diabetic neuropathy, unspecified: Secondary | ICD-10-CM

## 2014-07-02 NOTE — Progress Notes (Signed)
   Subjective:    Patient ID: Nathan [name], male    DOB: 1933/09/09, 81 y.o.   MRN: [id number]  HPI Comments: Pt seen in office for debridement of callouses B/L 1st MPJ and 10 toenails, by Dr Gardiner Barefoot.     Review of Systems     HPI Presents today chief complaint of painful elongated toenails.  Objective: Pulses are palpable bilateral nails are thick, yellow dystrophic onychomycosis and painful palpation.   Assessment: Onychomycosis with pain in limb.  Plan: Treatment of nails in thickness and length as covered service secondary to pain.    Physical Exam        Assessment & Plan:

## 2014-10-03 ENCOUNTER — Encounter: Payer: Self-pay | Admitting: Podiatry

## 2014-10-03 ENCOUNTER — Ambulatory Visit (INDEPENDENT_AMBULATORY_CARE_PROVIDER_SITE_OTHER): Payer: PPO | Admitting: Podiatry

## 2014-10-03 DIAGNOSIS — B351 Tinea unguium: Secondary | ICD-10-CM

## 2014-10-03 DIAGNOSIS — Q828 Other specified congenital malformations of skin: Secondary | ICD-10-CM | POA: Diagnosis not present

## 2014-10-03 DIAGNOSIS — E1140 Type 2 diabetes mellitus with diabetic neuropathy, unspecified: Secondary | ICD-10-CM | POA: Diagnosis not present

## 2014-10-03 DIAGNOSIS — E1142 Type 2 diabetes mellitus with diabetic polyneuropathy: Secondary | ICD-10-CM

## 2014-10-03 DIAGNOSIS — M79676 Pain in unspecified toe(s): Secondary | ICD-10-CM | POA: Diagnosis not present

## 2014-10-03 NOTE — Progress Notes (Signed)
Patient ID: Nathan [name], male   DOB: [birth year], 81 y.o.   MRN: [id number] Complaint:  Visit Type: Patient returns to my office for continued preventative foot care services. Complaint: Patient states" my nails have grown long and thick and become painful to walk and wear shoes" Patient has been diagnosed with DM with no foot complications. The patient presents for preventative foot care services. No changes to ROS  Podiatric Exam: Vascular: dorsalis pedis and posterior tibial pulses are palpable bilateral. Capillary return is immediate. Temperature gradient is WNL. Skin turgor WNL  Sensorium: Diminished  Semmes Weinstein monofilament test. Normal tactile sensation bilaterally. Nail Exam: Pt has thick disfigured discolored nails with subungual debris noted bilateral entire nail hallux through fifth toenails Ulcer Exam: There is no evidence of ulcer or pre-ulcerative changes or infection. Orthopedic Exam: Muscle tone and strength are WNL. No limitations in general ROM. No crepitus or effusions noted. Foot type and digits show no abnormalities. Bony prominences are unremarkable. Skin: No Porokeratosis. No infection or ulcers. Porokeratosis sub tibial sesamoid both feet.  Diagnosis:  Onychomycosis, , Pain in right toe, pain in left toes, Porokeratosis  Treatment & Plan Procedures and Treatment: Consent by patient was obtained for treatment procedures. The patient understood the discussion of treatment and procedures well. All questions were answered thoroughly reviewed. Debridement of mycotic and hypertrophic toenails, 1 through 5 bilateral and clearing of subungual debris. No ulceration, no infection noted.  Initiated diabetic shoe paperwork. Added padding to his insole left foot. Return Visit-Office Procedure: Patient instructed to return to the office for a follow up visit 3 months for continued evaluation and treatment.

## 2014-11-28 ENCOUNTER — Other Ambulatory Visit: Payer: Self-pay | Admitting: Internal Medicine

## 2014-11-28 DIAGNOSIS — N183 Chronic kidney disease, stage 3 (moderate): Secondary | ICD-10-CM

## 2014-12-03 ENCOUNTER — Ambulatory Visit
Admission: RE | Admit: 2014-12-03 | Discharge: 2014-12-03 | Disposition: A | Discharge status: Home / Self Care | Payer: PPO | Source: Ambulatory Visit | Attending: Internal Medicine | Admitting: Internal Medicine

## 2014-12-03 DIAGNOSIS — N183 Chronic kidney disease, stage 3 (moderate): Secondary | ICD-10-CM

## 2015-01-07 ENCOUNTER — Encounter: Payer: Self-pay | Admitting: Podiatry

## 2015-01-07 ENCOUNTER — Ambulatory Visit (INDEPENDENT_AMBULATORY_CARE_PROVIDER_SITE_OTHER): Payer: PPO | Admitting: Podiatry

## 2015-01-07 DIAGNOSIS — Q828 Other specified congenital malformations of skin: Secondary | ICD-10-CM

## 2015-01-07 DIAGNOSIS — M79676 Pain in unspecified toe(s): Secondary | ICD-10-CM | POA: Diagnosis not present

## 2015-01-07 DIAGNOSIS — B351 Tinea unguium: Secondary | ICD-10-CM

## 2015-01-07 NOTE — Progress Notes (Signed)
Patient ID: Nathan [name], male   DOB: 1933-04-30, 81 y.o.   MRN: [id number] Complaint:  Visit Type: Patient returns to my office for continued preventative foot care services. Complaint: Patient states" my nails have grown long and thick and become painful to walk and wear shoes" Patient has been diagnosed with DM with no foot complications. The patient presents for preventative foot care services. No changes to ROS  Podiatric Exam: Vascular: dorsalis pedis and posterior tibial pulses are palpable bilateral. Capillary return is immediate. Temperature gradient is WNL. Skin turgor WNL  Sensorium: Diminished  Semmes Weinstein monofilament test. Normal tactile sensation bilaterally. Nail Exam: Pt has thick disfigured discolored nails with subungual debris noted bilateral entire nail hallux through fifth toenails Ulcer Exam: There is no evidence of ulcer or pre-ulcerative changes or infection. Orthopedic Exam: Muscle tone and strength are WNL. No limitations in general ROM. No crepitus or effusions noted. Foot type and digits show no abnormalities. Bony prominences are unremarkable. Skin: No Porokeratosis. No infection or ulcers. Porokeratosis sub tibial sesamoid both feet.  Diagnosis:  Onychomycosis, , Pain in right toe, pain in left toes, Porokeratosis  Treatment & Plan Procedures and Treatment: Consent by patient was obtained for treatment procedures. The patient understood the discussion of treatment and procedures well. All questions were answered thoroughly reviewed. Debridement of mycotic and hypertrophic toenails, 1 through 5 bilateral and clearing of subungual debris. No ulceration, no infection noted.  Initiated diabetic shoe paperwork. Added padding to his insole left foot. Return Visit-Office Procedure: Patient instructed to return to the office for a follow up visit 3 months for continued evaluation and treatment.

## 2015-01-16 HISTORY — PX: BILIARY DRAINAGE CATHETER PLACEMENT W/ BILE DUCT TUBE CHANGE: SHX1230

## 2015-01-17 ENCOUNTER — Ambulatory Visit (INDEPENDENT_AMBULATORY_CARE_PROVIDER_SITE_OTHER): Payer: PPO | Admitting: Cardiovascular Disease

## 2015-01-17 ENCOUNTER — Encounter: Payer: Self-pay | Admitting: Cardiovascular Disease

## 2015-01-17 VITALS — BP 140/72 | HR 66 | Ht 72.0 in | Wt 196.0 lb

## 2015-01-17 DIAGNOSIS — I4510 Unspecified right bundle-branch block: Secondary | ICD-10-CM | POA: Diagnosis not present

## 2015-01-17 DIAGNOSIS — I2583 Coronary atherosclerosis due to lipid rich plaque: Secondary | ICD-10-CM | POA: Diagnosis not present

## 2015-01-17 DIAGNOSIS — I2510 Atherosclerotic heart disease of native coronary artery without angina pectoris: Secondary | ICD-10-CM | POA: Diagnosis not present

## 2015-01-17 NOTE — Progress Notes (Signed)
Nathan [name] Date of Birth  05-03-1933       [facility]    [employer] [address], [address], [place], [place]  [address]   [place], [place]  [address] [phone]     [phone]   Fax  [phone]    Fax [phone]  Problem List: 1. Coronary artery disease-status post CABG 2. Aortic insufficiency 3. Hypercholesterolemia 4. Diabetes mellitus 5. Mitral regurgitation  History of Present Illness:  Nathan [name] is doing very well. He's not had any episodes of chest pain or shortness of breath. 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  Dec. 2, 2014:  Nathan [name] is doing well.  Not as much energy.   No CP or dyspnea.    Dec. 3,2015:  Doing well from a cardiac standpoint.  started taking insulin recently.   Worked again for [name] over the summer and fall.   Dec. 2, 2016:  Doing well from a cardiac standpoint.   BP is high today , was normal yesterday   Current Outpatient Prescriptions on File Prior to Visit  Medication Sig Dispense Refill  . amLODipine (NORVASC) 5 MG tablet Take 5 mg by mouth daily.      [name] aspirin 81 MG tablet Take 81 mg by mouth daily.    [name] atenolol (TENORMIN) 50 MG tablet Take 100 mg by mouth daily.     . multivitamin (THERAGRAN) per tablet Take 1 tablet by mouth daily.      . Omega-3 Fatty Acids (FISH OIL) 1200 MG CAPS Take 2 capsules by mouth daily.     [name] omeprazole (PRILOSEC) 20 MG capsule Take 20 mg by mouth daily.     . pioglitazone (ACTOS) 15 MG tablet Take 15 mg by mouth daily.    . simvastatin (ZOCOR) 20 MG tablet Take 20 mg by mouth at bedtime.       No current facility-administered medications on file prior to visit.    No Known Allergies  Past Medical History  Diagnosis Date  . Hyperlipidemia   . Coronary artery disease 2002    CABG x5  . Diabetes mellitus   . Hypercholesteremia   . Mitral valve regurgitation   . Aortic  insufficiency   . Skin cancer     tumor removed off of his back  . Acid reflux   . DVT (deep venous thrombosis) ([place])     previously on coumadin    Past Surgical History  Procedure Laterality Date  . Coronary artery bypass graft  12/21/2000    x5  . Tumor removal      off of back, skin cancer  . Skin cancer excision      History  Smoking status  . Never Smoker   Smokeless tobacco  . Not on file    History  Alcohol Use No    Family History  Problem Relation Age of Onset  . Heart attack Father   . Hypertension Mother   . Diabetes Brother   . Cancer Sister     breast  . Cancer Brother     throat & mouth    Reviw of Systems:  Reviewed in the HPI.  All other systems are negative.  Physical Exam: Blood pressure 140/72, pulse 66, height 6' (1.829 m), weight 196 lb (88.905 kg). General: Well developed, well nourished, in no acute distress.  Head: Normocephalic, atraumatic, sclera non-icteric,  mucus membranes are moist,   Neck: Supple. Carotids are 2 + without bruits. No JVD  Lungs: Clear bilaterally to auscultation.  Heart: regular rate.  normal  S1 S2. No murmurs, gallops or rubs.  Abdomen: Soft, non-tender, non-distended with normal bowel sounds. No hepatomegaly. No rebound/guarding. No masses.  Msk:  Strength and tone are normal  Extremities: No clubbing or cyanosis. No edema.  Distal pedal pulses are 2+ and equal bilaterally.  Neuro: Alert and oriented X 3. Moves all extremities spontaneously.  Psych:  Responds to questions appropriately with a normal affect.  ECG: Dec. 2, 2016:   NSR at 66.  1st degree AV block ,  RBBB . The RBBB is new since previous tracing   Assessment / Plan:   1. Coronary artery disease-status post CABG-   No episodes of angina. Continue current medications. 2. Aortic insufficiency 3. Hypercholesterolemia-  Doing well. Continue current medications. 4. Diabetes mellitus 5. Mitral regurgitation   I'll see him again in one  year.  Nahser, Wonda Cheng, MD  01/17/2015 8:20 AM    [facility] [place],  [place], [place]  [address] Pager [phone] Phone: [phone]; Fax: [phone]   [facility]  [address] [place],   [address] [phone]   Fax [phone]

## 2015-01-17 NOTE — Patient Instructions (Signed)
Medication Instructions:  Your physician recommends that you continue on your current medications as directed. Please refer to the Current Medication list given to you today.   Labwork: None Ordered   Testing/Procedures: Your physician has requested that you have a lexiscan myoview. For further information please visit [email or web address]. Please follow instruction sheet, as given.   Follow-Up: Your physician wants you to follow-up in: 1 year with Dr. Nahser.  You will receive a reminder letter in the mail two months in advance. If you don't receive a letter, please call our office to schedule the follow-up appointment.   If you need a refill on your cardiac medications before your next appointment, please call your pharmacy.   Thank you for choosing [facility]! Kinley Dozier, RN [phone]    

## 2015-01-19 ENCOUNTER — Emergency Department (HOSPITAL_COMMUNITY)
Admission: EM | Admit: 2015-01-19 | Discharge: 2015-01-19 | Disposition: A | Discharge status: Home / Self Care | Payer: PPO | Source: Home / Self Care | Attending: Emergency Medicine | Admitting: Emergency Medicine

## 2015-01-19 ENCOUNTER — Emergency Department (HOSPITAL_COMMUNITY): Payer: PPO

## 2015-01-19 ENCOUNTER — Encounter (HOSPITAL_COMMUNITY): Payer: Self-pay | Admitting: Emergency Medicine

## 2015-01-19 DIAGNOSIS — K819 Cholecystitis, unspecified: Secondary | ICD-10-CM | POA: Diagnosis not present

## 2015-01-19 DIAGNOSIS — Z79899 Other long term (current) drug therapy: Secondary | ICD-10-CM

## 2015-01-19 DIAGNOSIS — I2510 Atherosclerotic heart disease of native coronary artery without angina pectoris: Secondary | ICD-10-CM | POA: Insufficient documentation

## 2015-01-19 DIAGNOSIS — Z951 Presence of aortocoronary bypass graft: Secondary | ICD-10-CM | POA: Insufficient documentation

## 2015-01-19 DIAGNOSIS — Z794 Long term (current) use of insulin: Secondary | ICD-10-CM

## 2015-01-19 DIAGNOSIS — Z86718 Personal history of other venous thrombosis and embolism: Secondary | ICD-10-CM

## 2015-01-19 DIAGNOSIS — E785 Hyperlipidemia, unspecified: Secondary | ICD-10-CM | POA: Insufficient documentation

## 2015-01-19 DIAGNOSIS — K8020 Calculus of gallbladder without cholecystitis without obstruction: Secondary | ICD-10-CM | POA: Insufficient documentation

## 2015-01-19 DIAGNOSIS — K219 Gastro-esophageal reflux disease without esophagitis: Secondary | ICD-10-CM

## 2015-01-19 DIAGNOSIS — E119 Type 2 diabetes mellitus without complications: Secondary | ICD-10-CM

## 2015-01-19 DIAGNOSIS — Z7982 Long term (current) use of aspirin: Secondary | ICD-10-CM | POA: Insufficient documentation

## 2015-01-19 DIAGNOSIS — Z85828 Personal history of other malignant neoplasm of skin: Secondary | ICD-10-CM

## 2015-01-19 DIAGNOSIS — Z7984 Long term (current) use of oral hypoglycemic drugs: Secondary | ICD-10-CM

## 2015-01-19 DIAGNOSIS — A4151 Sepsis due to Escherichia coli [E. coli]: Secondary | ICD-10-CM | POA: Diagnosis not present

## 2015-01-19 DIAGNOSIS — R1013 Epigastric pain: Secondary | ICD-10-CM

## 2015-01-19 DIAGNOSIS — R1011 Right upper quadrant pain: Secondary | ICD-10-CM

## 2015-01-19 LAB — COMPREHENSIVE METABOLIC PANEL
ALT: 21 U/L (ref 17–63)
ANION GAP: 11 (ref 5–15)
AST: 26 U/L (ref 15–41)
Albumin: 3.8 g/dL (ref 3.5–5.0)
Alkaline Phosphatase: 75 U/L (ref 38–126)
BUN: 23 mg/dL — ABNORMAL HIGH (ref 6–20)
CHLORIDE: 107 mmol/L (ref 101–111)
CO2: 20 mmol/L — AB (ref 22–32)
CREATININE: 1.74 mg/dL — AB (ref 0.61–1.24)
Calcium: 9.4 mg/dL (ref 8.9–10.3)
GFR calc non Af Amer: 36 mL/min — ABNORMAL LOW (ref >60)
GFR, EST AFRICAN AMERICAN: 41 mL/min — AB (ref >60)
Glucose, Bld: 230 mg/dL — ABNORMAL HIGH (ref 65–99)
Potassium: 3.3 mmol/L — ABNORMAL LOW (ref 3.5–5.1)
SODIUM: 138 mmol/L (ref 135–145)
Total Bilirubin: 0.8 mg/dL (ref 0.3–1.2)
Total Protein: 7.9 g/dL (ref 6.5–8.1)

## 2015-01-19 LAB — URINALYSIS, ROUTINE W REFLEX MICROSCOPIC
Bilirubin Urine: NEGATIVE
Ketones, ur: NEGATIVE mg/dL
Leukocytes, UA: NEGATIVE
Nitrite: NEGATIVE
Protein, ur: >300 mg/dL — AB
SPECIFIC GRAVITY, URINE: 1.015 (ref 1.005–1.030)
pH: 5.5 (ref 5.0–8.0)

## 2015-01-19 LAB — URINE MICROSCOPIC-ADD ON: RBC / HPF: NONE SEEN RBC/hpf (ref 0–5)

## 2015-01-19 LAB — CBC
HEMATOCRIT: 42.4 % (ref 39.0–52.0)
HEMOGLOBIN: 14.7 g/dL (ref 13.0–17.0)
MCH: 30.4 pg (ref 26.0–34.0)
MCHC: 34.7 g/dL (ref 30.0–36.0)
MCV: 87.8 fL (ref 78.0–100.0)
Platelets: 179 10*3/uL (ref 150–400)
RBC: 4.83 MIL/uL (ref 4.22–5.81)
RDW: 13.5 % (ref 11.5–15.5)
WBC: 13.1 10*3/uL — ABNORMAL HIGH (ref 4.0–10.5)

## 2015-01-19 LAB — LIPASE, BLOOD: LIPASE: 38 U/L (ref 11–51)

## 2015-01-19 LAB — TROPONIN I
Troponin I: <0.03 ng/mL (ref <0.031)
Troponin I: <0.03 ng/mL (ref <0.031)

## 2015-01-19 MED ORDER — BARIUM SULFATE 2.1 % PO SUSP
900.0000 mL | Freq: Once | ORAL | Status: AC
  Administered 2015-01-19: 900 mL via ORAL

## 2015-01-19 MED ORDER — BARIUM SULFATE 2.1 % PO SUSP
ORAL | Status: AC
  Filled 2015-01-19: qty 2

## 2015-01-19 MED ORDER — GI COCKTAIL ~~LOC~~
30.0000 mL | Freq: Once | ORAL | Status: AC
  Administered 2015-01-19: 30 mL via ORAL
  Filled 2015-01-19: qty 30

## 2015-01-19 MED ORDER — MORPHINE SULFATE (PF) 4 MG/ML IV SOLN
4.0000 mg | Freq: Once | INTRAVENOUS | Status: AC
  Administered 2015-01-19: 4 mg via INTRAVENOUS
  Filled 2015-01-19: qty 1

## 2015-01-19 MED ORDER — ONDANSETRON HCL 4 MG/2ML IJ SOLN
4.0000 mg | Freq: Once | INTRAMUSCULAR | Status: AC
  Administered 2015-01-19: 4 mg via INTRAVENOUS
  Filled 2015-01-19: qty 2

## 2015-01-19 NOTE — ED Provider Notes (Signed)
9:35 AM  Patient is a very pleasant 81 year old male with history of CAD GERD hyperlipidemia and diabetes presented overnight with epigastric pain that resolved with GI cocktail. Given his age and history, previous provider ordered serial troponins as well as CAT scan. CAT scan showed some question of cholecystitis. Surgery was called and ultrasound was ordered. Ultrasound is negative surgery does not think patient requires any surgical operation at this point. Patient's has no pain to palpation on exam. He will reports he feels in his usual state of health. Patient has had normal vital signs. Patient reports he is hungry. Given his normal vital signs and normal physical exam I don't know there is reason for admission at this time.  We will give breakfast tray make sure the patient's able to eat. Patient says that he can follow-up with Dr. Sharlett Iles tomorrow morning (on Monday). He also has a stress test scheduled for next week with cardiology. We went over strict return precautions with patient and he expresses understanding.   Erminie Foulks Julio Alm, MD 01/19/15 [phone]

## 2015-01-19 NOTE — Discharge Instructions (Signed)
We want you to conitnue to take your omeprazole.  You may take mylantaa if it helps soothe your stomach. IF you have any increase in pain, or changes in symtpoms please return immediately to the ED. Please call your PCP in the morning as we discussed!  Also please follow up with your doctor about the blood in your urine.

## 2015-01-19 NOTE — ED Notes (Signed)
Patient transported to Ultrasound 

## 2015-01-19 NOTE — ED Notes (Signed)
Pt. reports mid abdominal pain with nausea and emesis onset this evening , denies fever or diarrhea.

## 2015-01-19 NOTE — Consult Note (Signed)
Reason for Consult:  Abdominal pain Referring Physician: [facility]anc[facility] - EDp  Nathan [name] is an 81 y.o. male.  HPI: This is a 81 yo male with CAD, DM who presents with acute onset of left-sided abdominal pain, along with some mild nausea.  He was evaluated in the ED.  His pain has now resolved.  He underwent a CT scan which showed possible pericholecystic edema, but ultrasound is unremarkable.  We were asked to see the patient.  He was seen by his cardiologist Dr. Acie Fredrickson on 01/17/15    Past Medical History  Diagnosis Date  . Hyperlipidemia   . Coronary artery disease 2002    CABG x5  . Diabetes mellitus   . Hypercholesteremia   . Mitral valve regurgitation   . Aortic insufficiency   . Skin cancer     tumor removed off of his back  . Acid reflux   . DVT (deep venous thrombosis) ([place])     previously on coumadin    Past Surgical History  Procedure Laterality Date  . Coronary artery bypass graft  12/21/2000    x5  . Tumor removal      off of back, skin cancer  . Skin cancer excision      Family History  Problem Relation Age of Onset  . Heart attack Father   . Hypertension Mother   . Diabetes Brother   . Cancer Sister     breast  . Cancer Brother     throat & mouth    Social History:  reports that he has never smoked. He does not have any smokeless tobacco history on file. He reports that he does not drink alcohol or use illicit drugs.  Allergies: No Known Allergies  Medications:  Prior to Admission medications   Medication Sig Start Date End Date Taking? Authorizing Provider  amLODipine (NORVASC) 5 MG tablet Take 5 mg by mouth daily.     Yes Historical Provider, MD  aspirin 81 MG tablet Take 81 mg by mouth daily.   Yes Historical Provider, MD  atenolol (TENORMIN) 50 MG tablet Take 50 mg by mouth 2 (two) times daily.    Yes Historical Provider, MD  insulin NPH-regular Human (NOVOLIN 70/30) (70-30) 100 UNIT/ML injection Inject 22-36 Units into the skin See admin  instructions. Take 36 units in the morning and 22 units at bedtime.   Yes Historical Provider, MD  multivitamin [facility]) per tablet Take 1 tablet by mouth daily.     Yes Historical Provider, MD  Omega-3 Fatty Acids (FISH OIL) 1200 MG CAPS Take 2 capsules by mouth daily.    Yes Historical Provider, MD  omeprazole (PRILOSEC) 20 MG capsule Take 20 mg by mouth daily.  01/06/11  Yes Historical Provider, MD  pioglitazone (ACTOS) 15 MG tablet Take 15 mg by mouth daily.   Yes Historical Provider, MD  simvastatin (ZOCOR) 20 MG tablet Take 20 mg by mouth at bedtime.     Yes Historical Provider, MD     Results for orders placed or performed during the hospital encounter of 01/19/15 (from the past 48 hour(s))  Urinalysis, Routine w reflex microscopic (not at [facility])     Status: Abnormal   Collection Time: 01/19/15 12:13 AM  Result Value Ref Range   Color, Urine YELLOW YELLOW   APPearance CLEAR CLEAR   Specific Gravity, Urine 1.015 1.005 - 1.030   pH 5.5 5.0 - 8.0   Glucose, UA >1000 (A) NEGATIVE mg/dL   Hgb urine dipstick MODERATE (A)  NEGATIVE   Bilirubin Urine NEGATIVE NEGATIVE   Ketones, ur NEGATIVE NEGATIVE mg/dL   Protein, ur >300 (A) NEGATIVE mg/dL   Nitrite NEGATIVE NEGATIVE   Leukocytes, UA NEGATIVE NEGATIVE  Urine microscopic-add on     Status: Abnormal   Collection Time: 01/19/15 12:13 AM  Result Value Ref Range   Squamous Epithelial / LPF 0-5 (A) NONE SEEN   WBC, UA 0-5 0 - 5 WBC/hpf   RBC / HPF NONE SEEN 0 - 5 RBC/hpf   Bacteria, UA RARE (A) NONE SEEN  Lipase, blood     Status: None   Collection Time: 01/19/15 12:33 AM  Result Value Ref Range   Lipase 38 11 - 51 U/L  Comprehensive metabolic panel     Status: Abnormal   Collection Time: 01/19/15 12:33 AM  Result Value Ref Range   Sodium 138 135 - 145 mmol/L   Potassium 3.3 (L) 3.5 - 5.1 mmol/L   Chloride 107 101 - 111 mmol/L   CO2 20 (L) 22 - 32 mmol/L   Glucose, Bld 230 (H) 65 - 99 mg/dL   BUN 23 (H) 6 - 20 mg/dL    Creatinine, Ser 1.74 (H) 0.61 - 1.24 mg/dL   Calcium 9.4 8.9 - 10.3 mg/dL   Total Protein 7.9 6.5 - 8.1 g/dL   Albumin 3.8 3.5 - 5.0 g/dL   AST 26 15 - 41 U/L   ALT 21 17 - 63 U/L   Alkaline Phosphatase 75 38 - 126 U/L   Total Bilirubin 0.8 0.3 - 1.2 mg/dL   GFR calc non Af Amer 36 (L) >60 mL/min   GFR calc Af Amer 41 (L) >60 mL/min    Comment: (NOTE) The eGFR has been calculated using the CKD EPI equation. This calculation has not been validated in all clinical situations. eGFR's persistently <60 mL/min signify possible Chronic Kidney Disease.    Anion gap 11 5 - 15  CBC     Status: Abnormal   Collection Time: 01/19/15 12:33 AM  Result Value Ref Range   WBC 13.1 (H) 4.0 - 10.5 K/uL   RBC 4.83 4.22 - 5.81 MIL/uL   Hemoglobin 14.7 13.0 - 17.0 g/dL   HCT 42.4 39.0 - 52.0 %   MCV 87.8 78.0 - 100.0 fL   MCH 30.4 26.0 - 34.0 pg   MCHC 34.7 30.0 - 36.0 g/dL   RDW 13.5 11.5 - 15.5 %   Platelets 179 150 - 400 K/uL  Troponin I     Status: None   Collection Time: 01/19/15 12:33 AM  Result Value Ref Range   Troponin I <0.03 <0.031 ng/mL    Comment:        NO INDICATION OF MYOCARDIAL INJURY.   Troponin I     Status: None   Collection Time: 01/19/15  3:50 AM  Result Value Ref Range   Troponin I <0.03 <0.031 ng/mL    Comment:        NO INDICATION OF MYOCARDIAL INJURY.     Ct Abdomen Pelvis Wo Contrast  01/19/2015  CLINICAL DATA:  Mid abdominal pain with nausea and vomiting since last night. EXAM: CT ABDOMEN AND PELVIS WITHOUT CONTRAST TECHNIQUE: Multidetector CT imaging of the abdomen and pelvis was performed following the standard protocol without IV contrast. COMPARISON:  CT abdomen and pelvis dated 08/28/2010. FINDINGS: Gallbladder is moderately distended. Gallbladder walls are at least mildly thickened and indistinct with subtle pericholecystic edema suggesting acute cholecystitis. Liver, spleen, pancreas, and adrenal glands are  unremarkable. Parapelvic renal cysts again noted  bilaterally. Kidneys otherwise unremarkable without stone or hydronephrosis. No ureteral or bladder calculi identified. Bladder is moderately distended but otherwise unremarkable. Prostate gland is moderately enlarged causing some mass effect on the bladder base. Bowel is normal in caliber. Scattered diverticulosis noted within the sigmoid colon without evidence of acute diverticulitis. Appendix is normal. No free fluid or abscess collection. No free intraperitoneal air. No enlarged lymph nodes seen. Atherosclerotic changes are seen along the walls of the normal-caliber abdominal aorta. There is small hiatal hernia. Mild scarring/atelectasis at the adjacent lung bases. Small benign granulomas also noted at the right lung base. Fairly extensive coronary artery calcifications noted at the heart base. Median sternotomy wires in place, presumably related to a previous CABG. Scattered degenerative changes are seen throughout the thoracolumbar spine, moderate in degree. Chronic pars interarticularis defects noted at the L5-S1 level with associated grade 2 anterolisthesis of L5 on S1. No acute- appearing osseous abnormality. IMPRESSION: 1. Suspect acute cholecystitis. Recommend gallbladder ultrasound and/or nuclear medicine HIDA scan for confirmation. 2. Prostate enlargement, similar to previous CT, with associated mass effect on the bladder base. 3. Small hiatal hernia. 4. Colonic diverticulosis without evidence of acute diverticulitis. 5. Bilateral chronic pars interarticularis defects at L5-S1 with resultant grade 2 spondylolisthesis of L5 on S1. This was also described on the previous CT report. These results were called by telephone at the time of interpretation on 01/19/2015 at 7:20 am to Dr. [name] , who verbally acknowledged these results. Electronically Signed   By: Franki Cabot M.D.   On: 01/19/2015 07:22   US Abdomen Limited Ruq  01/19/2015  CLINICAL DATA:  Right upper quadrant abdominal pain. Nausea  and vomiting. EXAM: US ABDOMEN LIMITED - RIGHT UPPER QUADRANT COMPARISON:  CT 01/19/2015.  Ultrasound 12/03/2014. FINDINGS: Gallbladder: No gallstones or wall thickening visualized. No sonographic Murphy sign noted. Common bile duct: Diameter: 3.3 mm Liver: No focal lesion identified. Within normal limits in parenchymal echogenicity. Evaluation is mildly limited by bowel gas. IMPRESSION: Negative right upper quadrant ultrasound. No evidence of cholecystitis or biliary dilatation. Electronically Signed   By: Richardean Sale M.D.   On: 01/19/2015 08:16    Review of Systems  Constitutional: Negative for weight loss.  HENT: Negative for ear discharge, ear pain, hearing loss and tinnitus.   Eyes: Negative for blurred vision, double vision, photophobia and pain.  Respiratory: Negative for cough, sputum production and shortness of breath.   Cardiovascular: Negative for chest pain.  Gastrointestinal: Positive for nausea and abdominal pain. Negative for vomiting.  Genitourinary: Negative for dysuria, urgency, frequency and flank pain.  Musculoskeletal: Negative for myalgias, back pain, joint pain, falls and neck pain.  Neurological: Negative for dizziness, tingling, sensory change, focal weakness, loss of consciousness and headaches.  Endo/Heme/Allergies: Does not bruise/bleed easily.  Psychiatric/Behavioral: Negative for depression, memory loss and substance abuse. The patient is not nervous/anxious.    Blood pressure 164/63, pulse 72, temperature 97.5 F (36.4 C), temperature source Oral, resp. rate 24, height 6' (1.829 m), weight 87.998 kg (194 lb), SpO2 90 %. Physical Exam WDWN in NAD HEENT:  EOMI, sclera anicteric Neck:  No masses, no thyromegaly Lungs:  CTA bilaterally; normal respiratory effort CV:  Regular rate and rhythm; no murmurs Abd:  +bowel sounds, soft, no RUQ tenderness; no masses; minimal LUQ tenderness Ext:  Well-perfused; no edema Skin:  Warm, dry; no sign of  jaundice  Assessment/Plan: No signs of acute cholecystitis  Hematuria/ elevated WBC  - ?  prostate issues.  Would feed the patient.  If asymptomatic, he can be discharged to follow-up with PCP.  No acute surgical indications.   Toshiro Hanken K. 01/19/2015, 8:56 AM

## 2015-01-19 NOTE — ED Notes (Signed)
Spoke with CT regarding delay.  Per staff will be doing shortly.

## 2015-01-19 NOTE — ED Provider Notes (Signed)
CSN: [id number]     Arrival date & time 01/19/15  0018 History  By signing my name below, I, [facility], attest that this documentation has been prepared under the direction and in the presence of Nathan Essex, MD. Electronically Signed: Virgel [name], ED Scribe. 01/19/2015. 1:16 AM.   Chief Complaint  Patient presents with  . Abdominal Pain   The history is provided by the patient. No language interpreter was used.   HPI Comments: Nathan [name] is a 81 y.o. male with hx of CAD, GERD, HLN, and DM who presents to the Emergency Department complaining of mild, constant, unchanging epigastric abdominal pain onset 5 hours ago after eating. Patient reports associated 2 episodes emesis since onset. Per patient, he took two gas pills with no relief of symptoms. He notes five CABGs 14 years ago and was last seen by his cardiologist Dr. Stephens November yesterday who scheduled a stress test for the patient on Jan 27, 2015. Patient states no pain prior to previous CABG. He never had chest pain with his heart problems. He currently takes aspirin and Zocor. He denies taking coumadin currently. Patient denies similar symptoms in past. He denies hx of appendectomy, cholecystectomy, or other abdominal surgeries. Patient denies fever, diarrhea, chest pain, SOB, genital pain, hematuria or dysuria.  Past Medical History  Diagnosis Date  . Hyperlipidemia   . Coronary artery disease 2002    CABG x5  . Diabetes mellitus   . Hypercholesteremia   . Mitral valve regurgitation   . Aortic insufficiency   . Skin cancer     tumor removed off of his back  . Acid reflux   . DVT (deep venous thrombosis) ([place])     previously on coumadin   Past Surgical History  Procedure Laterality Date  . Coronary artery bypass graft  12/21/2000    x5  . Tumor removal      off of back, skin cancer  . Skin cancer excision     Family History  Problem Relation Age of Onset  . Heart attack Father   . Hypertension Mother    . Diabetes Brother   . Cancer Sister     breast  . Cancer Brother     throat & mouth   Social History  Substance Use Topics  . Smoking status: Never Smoker   . Smokeless tobacco: None  . Alcohol Use: No    Review of Systems A complete 10 system review of systems was obtained and all systems are negative except as noted in the HPI and PMH.    Allergies  Review of patient's allergies indicates no known allergies.  Home Medications   Prior to Admission medications   Medication Sig Start Date End Date Taking? Authorizing Provider  amLODipine (NORVASC) 5 MG tablet Take 5 mg by mouth daily.     Yes Historical Provider, MD  aspirin 81 MG tablet Take 81 mg by mouth daily.   Yes Historical Provider, MD  atenolol (TENORMIN) 50 MG tablet Take 50 mg by mouth 2 (two) times daily.    Yes Historical Provider, MD  insulin NPH-regular Human (NOVOLIN 70/30) (70-30) 100 UNIT/ML injection Inject 22-36 Units into the skin See admin instructions. Take 36 units in the morning and 22 units at bedtime.   Yes Historical Provider, MD  multivitamin [facility]) per tablet Take 1 tablet by mouth daily.     Yes Historical Provider, MD  Omega-3 Fatty Acids (FISH OIL) 1200 MG CAPS Take 2 capsules by mouth daily.  Yes Historical Provider, MD  omeprazole (PRILOSEC) 20 MG capsule Take 20 mg by mouth daily.  01/06/11  Yes Historical Provider, MD  pioglitazone (ACTOS) 15 MG tablet Take 15 mg by mouth daily.   Yes Historical Provider, MD  simvastatin (ZOCOR) 20 MG tablet Take 20 mg by mouth at bedtime.     Yes Historical Provider, MD   BP 164/63 mmHg  Pulse 72  Temp(Src) 97.5 F (36.4 C) (Oral)  Resp 24  Ht 6' (1.829 m)  Wt 194 lb (87.998 kg)  BMI 26.31 kg/m2  SpO2 90% Physical Exam  Constitutional: He is oriented to person, place, and time. He appears well-developed and well-nourished. No distress.  HENT:  Head: Normocephalic and atraumatic.  Mouth/Throat: Oropharynx is clear and moist. No oropharyngeal  exudate.  Eyes: Conjunctivae and EOM are normal. Pupils are equal, round, and reactive to light.  Neck: Normal range of motion. Neck supple.  No meningismus.  Cardiovascular: Normal rate, regular rhythm, normal heart sounds and intact distal pulses.   No murmur heard. Pulmonary/Chest: Effort normal and breath sounds normal. No respiratory distress.  Abdominal: Soft. There is no tenderness. There is no rebound and no guarding.  Epigastric tenderness. No RUQ tenderness.  Musculoskeletal: Normal range of motion. He exhibits no edema or tenderness.  Equal femoral, dp and pt pulses.   Neurological: He is alert and oriented to person, place, and time. No cranial nerve deficit. He exhibits normal muscle tone. Coordination normal.  No ataxia on finger to nose bilaterally. No pronator drift. 5/5 strength throughout. CN 2-12 intact.Equal grip strength. Sensation intact.   Skin: Skin is warm.  Psychiatric: He has a normal mood and affect. His behavior is normal.  Nursing note and vitals reviewed.   ED Course  Procedures   DIAGNOSTIC STUDIES: Oxygen Saturation is 97% on RA, normal by my interpretation.    COORDINATION OF CARE: 12:54 AM Will order chest x-ray, labs, and pain medications. Discussed treatment plan with pt at bedside and pt agreed to plan.   Labs Review Labs Reviewed  COMPREHENSIVE METABOLIC PANEL - Abnormal; Notable for the following:    Potassium 3.3 (*)    CO2 20 (*)    Glucose, Bld 230 (*)    BUN 23 (*)    Creatinine, Ser 1.74 (*)    GFR calc non Af Amer 36 (*)    GFR calc Af Amer 41 (*)    All other components within normal limits  CBC - Abnormal; Notable for the following:    WBC 13.1 (*)    All other components within normal limits  URINALYSIS, ROUTINE W REFLEX MICROSCOPIC (NOT AT [facility]) - Abnormal; Notable for the following:    Glucose, UA >1000 (*)    Hgb urine dipstick MODERATE (*)    Protein, ur >300 (*)    All other components within normal limits  URINE  MICROSCOPIC-ADD ON - Abnormal; Notable for the following:    Squamous Epithelial / LPF 0-5 (*)    Bacteria, UA RARE (*)    All other components within normal limits  LIPASE, BLOOD  TROPONIN I  TROPONIN I    Imaging Review Ct Abdomen Pelvis Wo Contrast  01/19/2015  CLINICAL DATA:  Mid abdominal pain with nausea and vomiting since last night. EXAM: CT ABDOMEN AND PELVIS WITHOUT CONTRAST TECHNIQUE: Multidetector CT imaging of the abdomen and pelvis was performed following the standard protocol without IV contrast. COMPARISON:  CT abdomen and pelvis dated 08/28/2010. FINDINGS: Gallbladder is moderately distended.  Gallbladder walls are at least mildly thickened and indistinct with subtle pericholecystic edema suggesting acute cholecystitis. Liver, spleen, pancreas, and adrenal glands are unremarkable. Parapelvic renal cysts again noted bilaterally. Kidneys otherwise unremarkable without stone or hydronephrosis. No ureteral or bladder calculi identified. Bladder is moderately distended but otherwise unremarkable. Prostate gland is moderately enlarged causing some mass effect on the bladder base. Bowel is normal in caliber. Scattered diverticulosis noted within the sigmoid colon without evidence of acute diverticulitis. Appendix is normal. No free fluid or abscess collection. No free intraperitoneal air. No enlarged lymph nodes seen. Atherosclerotic changes are seen along the walls of the normal-caliber abdominal aorta. There is small hiatal hernia. Mild scarring/atelectasis at the adjacent lung bases. Small benign granulomas also noted at the right lung base. Fairly extensive coronary artery calcifications noted at the heart base. Median sternotomy wires in place, presumably related to a previous CABG. Scattered degenerative changes are seen throughout the thoracolumbar spine, moderate in degree. Chronic pars interarticularis defects noted at the L5-S1 level with associated grade 2 anterolisthesis of L5 on S1.  No acute- appearing osseous abnormality. IMPRESSION: 1. Suspect acute cholecystitis. Recommend gallbladder ultrasound and/or nuclear medicine HIDA scan for confirmation. 2. Prostate enlargement, similar to previous CT, with associated mass effect on the bladder base. 3. Small hiatal hernia. 4. Colonic diverticulosis without evidence of acute diverticulitis. 5. Bilateral chronic pars interarticularis defects at L5-S1 with resultant grade 2 spondylolisthesis of L5 on S1. This was also described on the previous CT report. These results were called by telephone at the time of interpretation on 01/19/2015 at 7:20 am to Dr. Ezequiel [name] , who verbally acknowledged these results. Electronically Signed   By: Franki Cabot M.D.   On: 01/19/2015 07:22   I have personally reviewed and evaluated these images and lab results as part of my medical decision-making.   EKG Interpretation   Date/Time:  Sunday January 19 2015 01:34:27 EST Ventricular Rate:  71 PR Interval:  290 QRS Duration: 159 QT Interval:  491 QTC Calculation: 534 R Axis:   11 Text Interpretation:  Sinus rhythm Prolonged PR interval Right bundle  branch block Right bundle branch block Confirmed by Delmy Holdren  MD, Makoto Sellitto  ([id number]) on 01/19/2015 1:50:08 AM      MDM   Final diagnoses:  RUQ pain  Cholecystitis   Epigastric pain with nausea that started this evening with one episode of vomiting.  Saw his cardiologist 2 days ago and was normal. EKG is unchanged. Patient feels improved after GI cocktail. Labs show mild leukocytosis with hematuria. Mild elevation of creatinine. Troponin negative 2.  Possible cholecystitis on CT scan with enlarged gallbladder wall and pericholecystic fluid. Leukocytosis with normal LFTs and lipase.  Will obtain ultrasound to confirm. Discussed with general surgery who will evaluate patient. Dr. Thomasene Lot to assume care. The blood in the urine   I personally performed the services described in this  documentation, which was scribed in my presence. The recorded information has been reviewed and is accurate.   Nathan Essex, MD 01/19/15 [phone]

## 2015-01-20 ENCOUNTER — Encounter (HOSPITAL_COMMUNITY): Payer: Self-pay | Admitting: Emergency Medicine

## 2015-01-20 ENCOUNTER — Emergency Department (HOSPITAL_COMMUNITY): Payer: PPO

## 2015-01-20 ENCOUNTER — Inpatient Hospital Stay (HOSPITAL_COMMUNITY)
Admission: EM | Admit: 2015-01-20 | Discharge: 2015-01-27 | DRG: 871 | Disposition: A | Discharge status: Home Health | Payer: PPO | Attending: Internal Medicine | Admitting: Internal Medicine

## 2015-01-20 DIAGNOSIS — A4151 Sepsis due to Escherichia coli [E. coli]: Principal | ICD-10-CM | POA: Diagnosis present

## 2015-01-20 DIAGNOSIS — I129 Hypertensive chronic kidney disease with stage 1 through stage 4 chronic kidney disease, or unspecified chronic kidney disease: Secondary | ICD-10-CM | POA: Diagnosis present

## 2015-01-20 DIAGNOSIS — R198 Other specified symptoms and signs involving the digestive system and abdomen: Secondary | ICD-10-CM

## 2015-01-20 DIAGNOSIS — R079 Chest pain, unspecified: Secondary | ICD-10-CM

## 2015-01-20 DIAGNOSIS — I2510 Atherosclerotic heart disease of native coronary artery without angina pectoris: Secondary | ICD-10-CM | POA: Diagnosis present

## 2015-01-20 DIAGNOSIS — Z794 Long term (current) use of insulin: Secondary | ICD-10-CM

## 2015-01-20 DIAGNOSIS — Z85828 Personal history of other malignant neoplasm of skin: Secondary | ICD-10-CM

## 2015-01-20 DIAGNOSIS — R109 Unspecified abdominal pain: Secondary | ICD-10-CM

## 2015-01-20 DIAGNOSIS — E8770 Fluid overload, unspecified: Secondary | ICD-10-CM | POA: Diagnosis present

## 2015-01-20 DIAGNOSIS — K819 Cholecystitis, unspecified: Secondary | ICD-10-CM

## 2015-01-20 DIAGNOSIS — E1122 Type 2 diabetes mellitus with diabetic chronic kidney disease: Secondary | ICD-10-CM | POA: Diagnosis present

## 2015-01-20 DIAGNOSIS — R0602 Shortness of breath: Secondary | ICD-10-CM

## 2015-01-20 DIAGNOSIS — E876 Hypokalemia: Secondary | ICD-10-CM | POA: Diagnosis present

## 2015-01-20 DIAGNOSIS — E118 Type 2 diabetes mellitus with unspecified complications: Secondary | ICD-10-CM

## 2015-01-20 DIAGNOSIS — A419 Sepsis, unspecified organism: Secondary | ICD-10-CM | POA: Diagnosis present

## 2015-01-20 DIAGNOSIS — K810 Acute cholecystitis: Secondary | ICD-10-CM | POA: Diagnosis present

## 2015-01-20 DIAGNOSIS — N184 Chronic kidney disease, stage 4 (severe): Secondary | ICD-10-CM | POA: Diagnosis present

## 2015-01-20 DIAGNOSIS — Z951 Presence of aortocoronary bypass graft: Secondary | ICD-10-CM

## 2015-01-20 DIAGNOSIS — R778 Other specified abnormalities of plasma proteins: Secondary | ICD-10-CM

## 2015-01-20 DIAGNOSIS — J189 Pneumonia, unspecified organism: Secondary | ICD-10-CM | POA: Diagnosis present

## 2015-01-20 DIAGNOSIS — N179 Acute kidney failure, unspecified: Secondary | ICD-10-CM | POA: Diagnosis present

## 2015-01-20 DIAGNOSIS — I351 Nonrheumatic aortic (valve) insufficiency: Secondary | ICD-10-CM | POA: Diagnosis present

## 2015-01-20 DIAGNOSIS — Z86718 Personal history of other venous thrombosis and embolism: Secondary | ICD-10-CM

## 2015-01-20 DIAGNOSIS — R7989 Other specified abnormal findings of blood chemistry: Secondary | ICD-10-CM

## 2015-01-20 DIAGNOSIS — Z7982 Long term (current) use of aspirin: Secondary | ICD-10-CM

## 2015-01-20 DIAGNOSIS — K219 Gastro-esophageal reflux disease without esophagitis: Secondary | ICD-10-CM | POA: Diagnosis present

## 2015-01-20 DIAGNOSIS — I252 Old myocardial infarction: Secondary | ICD-10-CM

## 2015-01-20 DIAGNOSIS — E7800 Pure hypercholesterolemia, unspecified: Secondary | ICD-10-CM | POA: Diagnosis present

## 2015-01-20 DIAGNOSIS — Z79899 Other long term (current) drug therapy: Secondary | ICD-10-CM

## 2015-01-20 DIAGNOSIS — E785 Hyperlipidemia, unspecified: Secondary | ICD-10-CM | POA: Diagnosis present

## 2015-01-20 DIAGNOSIS — R0600 Dyspnea, unspecified: Secondary | ICD-10-CM

## 2015-01-20 DIAGNOSIS — R0902 Hypoxemia: Secondary | ICD-10-CM

## 2015-01-20 HISTORY — DX: Cholecystitis, unspecified: K81.9

## 2015-01-20 LAB — COMPREHENSIVE METABOLIC PANEL
ALBUMIN: 3.1 g/dL — AB (ref 3.5–5.0)
ALK PHOS: 54 U/L (ref 38–126)
ALT: 20 U/L (ref 17–63)
ANION GAP: 9 (ref 5–15)
AST: 31 U/L (ref 15–41)
BUN: 26 mg/dL — ABNORMAL HIGH (ref 6–20)
CALCIUM: 9.2 mg/dL (ref 8.9–10.3)
CHLORIDE: 104 mmol/L (ref 101–111)
CO2: 22 mmol/L (ref 22–32)
CREATININE: 2.02 mg/dL — AB (ref 0.61–1.24)
GFR, EST AFRICAN AMERICAN: 34 mL/min — AB (ref >60)
GFR, EST NON AFRICAN AMERICAN: 30 mL/min — AB (ref >60)
Glucose, Bld: 179 mg/dL — ABNORMAL HIGH (ref 65–99)
Potassium: 4.2 mmol/L (ref 3.5–5.1)
SODIUM: 135 mmol/L (ref 135–145)
Total Bilirubin: 2.1 mg/dL — ABNORMAL HIGH (ref 0.3–1.2)
Total Protein: 7 g/dL (ref 6.5–8.1)

## 2015-01-20 LAB — LIPASE, BLOOD: Lipase: 22 U/L (ref 11–51)

## 2015-01-20 LAB — CBC
HCT: 42.4 % (ref 39.0–52.0)
HEMOGLOBIN: 14.5 g/dL (ref 13.0–17.0)
MCH: 30.3 pg (ref 26.0–34.0)
MCHC: 34.2 g/dL (ref 30.0–36.0)
MCV: 88.7 fL (ref 78.0–100.0)
PLATELETS: 164 10*3/uL (ref 150–400)
RBC: 4.78 MIL/uL (ref 4.22–5.81)
RDW: 13.4 % (ref 11.5–15.5)
WBC: 24.4 10*3/uL — AB (ref 4.0–10.5)

## 2015-01-20 LAB — URINALYSIS, ROUTINE W REFLEX MICROSCOPIC
Glucose, UA: 500 mg/dL — AB
Ketones, ur: 15 mg/dL — AB
LEUKOCYTES UA: NEGATIVE
Nitrite: NEGATIVE
SPECIFIC GRAVITY, URINE: 1.025 (ref 1.005–1.030)
pH: 5.5 (ref 5.0–8.0)

## 2015-01-20 LAB — URINE MICROSCOPIC-ADD ON

## 2015-01-20 LAB — CBG MONITORING, ED: GLUCOSE-CAPILLARY: 187 mg/dL — AB (ref 65–99)

## 2015-01-20 MED ORDER — SODIUM CHLORIDE 0.9 % IV SOLN
1000.0000 mL | Freq: Once | INTRAVENOUS | Status: AC
  Administered 2015-01-20: 1000 mL via INTRAVENOUS

## 2015-01-20 MED ORDER — SODIUM CHLORIDE 0.9 % IV SOLN
1000.0000 mL | INTRAVENOUS | Status: DC
  Administered 2015-01-20: 1000 mL via INTRAVENOUS

## 2015-01-20 MED ORDER — BARIUM SULFATE 2.1 % PO SUSP
ORAL | Status: AC
  Filled 2015-01-20: qty 2

## 2015-01-20 NOTE — ED Notes (Signed)
Pt c/o RUQ abdominal pain. Pt seen here Saturday for same problem. Denies n/v/d. Pt states LBM was 01/19/15. Abdomen is soft and distended on inspection.

## 2015-01-20 NOTE — ED Notes (Signed)
Pt returned to room A7 from xray

## 2015-01-20 NOTE — ED Provider Notes (Signed)
CSN: [id number]     Arrival date & time 01/20/15  1907 History  By signing my name below, I, Julien Nordmann, attest that this documentation has been prepared under the direction and in the presence of Jola Schmidt, MD. Electronically Signed: Julien Nordmann, ED Scribe. 01/20/2015. 11:23 PM.    Chief Complaint  Patient presents with  . Abdominal Pain    The patient said he has been having abdominal pain but denies nausea, vomiting and diarrhea.  He says he has lost his appetite and is not peeing good.        The history is provided by the patient. No language interpreter was used.   HPI Comments: Nathan [name] is a 81 y.o. male who has a PMHx of CAD, CABGx5, DM, and hypercholesteremia presents to the Emergency Department complaining of intermittent, moderate, 5/10, gradual worsening right sided abdominal pain with associated loss of appetite and decreased urine onset 3 days ago. He notes he feels very "lousy", tired and does not have any energy. Pt states when he breathes in he feels the pain more on the right quadrant. He reports eating a few slices of peaches and one slice of toast this morning. Pt reports being seeing Saturday and yesterday morning for the same symptoms. He notes he received some medication on Saturday when he was seen that gave him some relief but he says the pain is back. Pt did receive his flu shot this season. He is scheduled for a stress test on 12/2. Pt denies nausea, vomiting, diarrhea, chest pain, testicle pain, scrotum pain and back pain.  Past Medical History  Diagnosis Date  . Hyperlipidemia   . Coronary artery disease 2002    CABG x5  . Diabetes mellitus   . Hypercholesteremia   . Mitral valve regurgitation   . Aortic insufficiency   . Skin cancer     tumor removed off of his back  . Acid reflux   . DVT (deep venous thrombosis) ([place])     previously on coumadin   Past Surgical History  Procedure Laterality Date  . Coronary artery bypass graft  12/21/2000     x5  . Tumor removal      off of back, skin cancer  . Skin cancer excision     Family History  Problem Relation Age of Onset  . Heart attack Father   . Hypertension Mother   . Diabetes Brother   . Cancer Sister     breast  . Cancer Brother     throat & mouth   Social History  Substance Use Topics  . Smoking status: Never Smoker   . Smokeless tobacco: None  . Alcohol Use: No    Review of Systems  A complete 10 system review of systems was obtained and all systems are negative except as noted in the HPI and PMH.    Allergies  Review of patient's allergies indicates no known allergies.  Home Medications   Prior to Admission medications   Medication Sig Start Date End Date Taking? Authorizing Provider  amLODipine (NORVASC) 5 MG tablet Take 5 mg by mouth daily.      Historical Provider, MD  aspirin 81 MG tablet Take 81 mg by mouth daily.    Historical Provider, MD  atenolol (TENORMIN) 50 MG tablet Take 50 mg by mouth 2 (two) times daily.     Historical Provider, MD  insulin NPH-regular Human (NOVOLIN 70/30) (70-30) 100 UNIT/ML injection Inject 22-36 Units into the skin See admin  instructions. Take 36 units in the morning and 22 units at bedtime.    Historical Provider, MD  multivitamin [facility]) per tablet Take 1 tablet by mouth daily.      Historical Provider, MD  Omega-3 Fatty Acids (FISH OIL) 1200 MG CAPS Take 2 capsules by mouth daily.     Historical Provider, MD  omeprazole (PRILOSEC) 20 MG capsule Take 20 mg by mouth daily.  01/06/11   Historical Provider, MD  pioglitazone (ACTOS) 15 MG tablet Take 15 mg by mouth daily.    Historical Provider, MD  simvastatin (ZOCOR) 20 MG tablet Take 20 mg by mouth at bedtime.      Historical Provider, MD   Triage vitals: BP 136/81 mmHg  Pulse 82  Temp(Src) 99.1 F (37.3 C) (Oral)  Resp 20  Ht 6' (1.829 m)  Wt 194 lb (87.998 kg)  BMI 26.31 kg/m2  SpO2 93% Physical Exam  Constitutional: He is oriented to person, place, and  time. He appears well-developed and well-nourished.  HENT:  Head: Normocephalic and atraumatic.  Eyes: EOM are normal.  Neck: Normal range of motion.  Cardiovascular: Normal rate, regular rhythm, normal heart sounds and intact distal pulses.   Pulmonary/Chest: Effort normal and breath sounds normal. No respiratory distress.  Abdominal: Soft. He exhibits no distension. There is tenderness.  Mild right side abdominal tenderness  Musculoskeletal: Normal range of motion.  Neurological: He is alert and oriented to person, place, and time.  Skin: Skin is warm and dry.  Psychiatric: He has a normal mood and affect. Judgment normal.  Nursing note and vitals reviewed.   ED Course  Procedures  DIAGNOSTIC STUDIES: Oxygen Saturation is 93% on RA, low by my interpretation.  COORDINATION OF CARE:  11:13 PM Discussed treatment plan which includes IV fluids, CT abdomen, chest x-ray with pt at bedside and pt agreed to plan.  Labs Review Labs Reviewed  COMPREHENSIVE METABOLIC PANEL - Abnormal; Notable for the following:    Glucose, Bld 179 (*)    BUN 26 (*)    Creatinine, Ser 2.02 (*)    Albumin 3.1 (*)    Total Bilirubin 2.1 (*)    GFR calc non Af Amer 30 (*)    GFR calc Af Amer 34 (*)    All other components within normal limits  CBC - Abnormal; Notable for the following:    WBC 24.4 (*)    All other components within normal limits  URINALYSIS, ROUTINE W REFLEX MICROSCOPIC (NOT AT [facility]) - Abnormal; Notable for the following:    Color, Urine AMBER (*)    Glucose, UA 500 (*)    Hgb urine dipstick MODERATE (*)    Bilirubin Urine SMALL (*)    Ketones, ur 15 (*)    Protein, ur >300 (*)    All other components within normal limits  URINE MICROSCOPIC-ADD ON - Abnormal; Notable for the following:    Squamous Epithelial / LPF 0-5 (*)    Bacteria, UA FEW (*)    Casts GRANULAR CAST (*)    All other components within normal limits  CBG MONITORING, ED - Abnormal; Notable for the following:     Glucose-Capillary 187 (*)    All other components within normal limits  LIPASE, BLOOD    Imaging Review Ct Abdomen Pelvis Wo Contrast  01/19/2015  CLINICAL DATA:  Mid abdominal pain with nausea and vomiting since last night. EXAM: CT ABDOMEN AND PELVIS WITHOUT CONTRAST TECHNIQUE: Multidetector CT imaging of the abdomen and pelvis was  performed following the standard protocol without IV contrast. COMPARISON:  CT abdomen and pelvis dated 08/28/2010. FINDINGS: Gallbladder is moderately distended. Gallbladder walls are at least mildly thickened and indistinct with subtle pericholecystic edema suggesting acute cholecystitis. Liver, spleen, pancreas, and adrenal glands are unremarkable. Parapelvic renal cysts again noted bilaterally. Kidneys otherwise unremarkable without stone or hydronephrosis. No ureteral or bladder calculi identified. Bladder is moderately distended but otherwise unremarkable. Prostate gland is moderately enlarged causing some mass effect on the bladder base. Bowel is normal in caliber. Scattered diverticulosis noted within the sigmoid colon without evidence of acute diverticulitis. Appendix is normal. No free fluid or abscess collection. No free intraperitoneal air. No enlarged lymph nodes seen. Atherosclerotic changes are seen along the walls of the normal-caliber abdominal aorta. There is small hiatal hernia. Mild scarring/atelectasis at the adjacent lung bases. Small benign granulomas also noted at the right lung base. Fairly extensive coronary artery calcifications noted at the heart base. Median sternotomy wires in place, presumably related to a previous CABG. Scattered degenerative changes are seen throughout the thoracolumbar spine, moderate in degree. Chronic pars interarticularis defects noted at the L5-S1 level with associated grade 2 anterolisthesis of L5 on S1. No acute- appearing osseous abnormality. IMPRESSION: 1. Suspect acute cholecystitis. Recommend gallbladder ultrasound  and/or nuclear medicine HIDA scan for confirmation. 2. Prostate enlargement, similar to previous CT, with associated mass effect on the bladder base. 3. Small hiatal hernia. 4. Colonic diverticulosis without evidence of acute diverticulitis. 5. Bilateral chronic pars interarticularis defects at L5-S1 with resultant grade 2 spondylolisthesis of L5 on S1. This was also described on the previous CT report. These results were called by telephone at the time of interpretation on 01/19/2015 at 7:20 am to Dr. [name] , who verbally acknowledged these results. Electronically Signed   By: Franki Cabot M.D.   On: 01/19/2015 07:22   US Abdomen Limited Ruq  01/19/2015  CLINICAL DATA:  Right upper quadrant abdominal pain. Nausea and vomiting. EXAM: US ABDOMEN LIMITED - RIGHT UPPER QUADRANT COMPARISON:  CT 01/19/2015.  Ultrasound 12/03/2014. FINDINGS: Gallbladder: No gallstones or wall thickening visualized. No sonographic Murphy sign noted. Common bile duct: Diameter: 3.3 mm Liver: No focal lesion identified. Within normal limits in parenchymal echogenicity. Evaluation is mildly limited by bowel gas. IMPRESSION: Negative right upper quadrant ultrasound. No evidence of cholecystitis or biliary dilatation. Electronically Signed   By: Richardean Sale M.D.   On: 01/19/2015 08:16   I have personally reviewed and evaluated these images and lab results as part of my medical decision-making.   EKG Interpretation None      MDM   Final diagnoses:  None    Patient will omit the hospital for suspected cholecystitis.  Antibiotics now.  Likely not a great surgical candidate.  Percutaneous cholecystostomy abated be a good option for this patient  I personally performed the services described in this documentation, which was scribed in my presence. The recorded information has been reviewed and is accurate.       Jola Schmidt, MD 02/07/15 [phone]

## 2015-01-20 NOTE — ED Notes (Signed)
The patient said he has been having abdominal pain but denies nausea, vomiting and diarrhea.  He says he has lost his appetite and is not peeing good.  He said he was seen here Saturday night for Epigastric pain.  He was given pain medication and it helped but he is back with ab pain.  He says he was shoveling gravel Friday night and is not sure if he pulled something.  He rates his pain 5/10.

## 2015-01-21 ENCOUNTER — Emergency Department (HOSPITAL_COMMUNITY): Payer: PPO

## 2015-01-21 ENCOUNTER — Inpatient Hospital Stay (HOSPITAL_COMMUNITY): Payer: PPO

## 2015-01-21 ENCOUNTER — Encounter (HOSPITAL_COMMUNITY): Payer: Self-pay

## 2015-01-21 DIAGNOSIS — Z951 Presence of aortocoronary bypass graft: Secondary | ICD-10-CM

## 2015-01-21 DIAGNOSIS — N184 Chronic kidney disease, stage 4 (severe): Secondary | ICD-10-CM | POA: Diagnosis present

## 2015-01-21 DIAGNOSIS — A4151 Sepsis due to Escherichia coli [E. coli]: Secondary | ICD-10-CM | POA: Diagnosis present

## 2015-01-21 DIAGNOSIS — R7989 Other specified abnormal findings of blood chemistry: Secondary | ICD-10-CM | POA: Diagnosis not present

## 2015-01-21 DIAGNOSIS — E118 Type 2 diabetes mellitus with unspecified complications: Secondary | ICD-10-CM | POA: Diagnosis not present

## 2015-01-21 DIAGNOSIS — E1122 Type 2 diabetes mellitus with diabetic chronic kidney disease: Secondary | ICD-10-CM | POA: Diagnosis present

## 2015-01-21 DIAGNOSIS — Z85828 Personal history of other malignant neoplasm of skin: Secondary | ICD-10-CM | POA: Diagnosis not present

## 2015-01-21 DIAGNOSIS — Z7982 Long term (current) use of aspirin: Secondary | ICD-10-CM | POA: Diagnosis not present

## 2015-01-21 DIAGNOSIS — I25810 Atherosclerosis of coronary artery bypass graft(s) without angina pectoris: Secondary | ICD-10-CM | POA: Diagnosis not present

## 2015-01-21 DIAGNOSIS — Z86718 Personal history of other venous thrombosis and embolism: Secondary | ICD-10-CM | POA: Diagnosis not present

## 2015-01-21 DIAGNOSIS — J189 Pneumonia, unspecified organism: Secondary | ICD-10-CM | POA: Diagnosis present

## 2015-01-21 DIAGNOSIS — A419 Sepsis, unspecified organism: Secondary | ICD-10-CM | POA: Diagnosis not present

## 2015-01-21 DIAGNOSIS — E7800 Pure hypercholesterolemia, unspecified: Secondary | ICD-10-CM

## 2015-01-21 DIAGNOSIS — K810 Acute cholecystitis: Secondary | ICD-10-CM | POA: Diagnosis present

## 2015-01-21 DIAGNOSIS — I252 Old myocardial infarction: Secondary | ICD-10-CM | POA: Diagnosis not present

## 2015-01-21 DIAGNOSIS — E876 Hypokalemia: Secondary | ICD-10-CM | POA: Diagnosis present

## 2015-01-21 DIAGNOSIS — I10 Essential (primary) hypertension: Secondary | ICD-10-CM

## 2015-01-21 DIAGNOSIS — K219 Gastro-esophageal reflux disease without esophagitis: Secondary | ICD-10-CM | POA: Diagnosis present

## 2015-01-21 DIAGNOSIS — E785 Hyperlipidemia, unspecified: Secondary | ICD-10-CM | POA: Diagnosis present

## 2015-01-21 DIAGNOSIS — N179 Acute kidney failure, unspecified: Secondary | ICD-10-CM | POA: Diagnosis present

## 2015-01-21 DIAGNOSIS — I2510 Atherosclerotic heart disease of native coronary artery without angina pectoris: Secondary | ICD-10-CM | POA: Diagnosis not present

## 2015-01-21 DIAGNOSIS — K819 Cholecystitis, unspecified: Secondary | ICD-10-CM | POA: Diagnosis present

## 2015-01-21 DIAGNOSIS — R0902 Hypoxemia: Secondary | ICD-10-CM | POA: Diagnosis not present

## 2015-01-21 DIAGNOSIS — I351 Nonrheumatic aortic (valve) insufficiency: Secondary | ICD-10-CM | POA: Diagnosis present

## 2015-01-21 DIAGNOSIS — Z794 Long term (current) use of insulin: Secondary | ICD-10-CM | POA: Diagnosis not present

## 2015-01-21 DIAGNOSIS — Z79899 Other long term (current) drug therapy: Secondary | ICD-10-CM | POA: Diagnosis not present

## 2015-01-21 DIAGNOSIS — E8770 Fluid overload, unspecified: Secondary | ICD-10-CM | POA: Diagnosis present

## 2015-01-21 DIAGNOSIS — I129 Hypertensive chronic kidney disease with stage 1 through stage 4 chronic kidney disease, or unspecified chronic kidney disease: Secondary | ICD-10-CM | POA: Diagnosis present

## 2015-01-21 LAB — LACTIC ACID, PLASMA
LACTIC ACID, VENOUS: 2.1 mmol/L — AB (ref 0.5–2.0)
Lactic Acid, Venous: 1.7 mmol/L (ref 0.5–2.0)
Lactic Acid, Venous: 1.7 mmol/L (ref 0.5–2.0)

## 2015-01-21 LAB — COMPREHENSIVE METABOLIC PANEL
ALT: 16 U/L — ABNORMAL LOW (ref 17–63)
AST: 25 U/L (ref 15–41)
Albumin: 2.6 g/dL — ABNORMAL LOW (ref 3.5–5.0)
Alkaline Phosphatase: 47 U/L (ref 38–126)
Anion gap: 8 (ref 5–15)
BILIRUBIN TOTAL: 1.6 mg/dL — AB (ref 0.3–1.2)
BUN: 27 mg/dL — AB (ref 6–20)
CALCIUM: 8.1 mg/dL — AB (ref 8.9–10.3)
CO2: 20 mmol/L — ABNORMAL LOW (ref 22–32)
Chloride: 107 mmol/L (ref 101–111)
Creatinine, Ser: 2.01 mg/dL — ABNORMAL HIGH (ref 0.61–1.24)
GFR calc Af Amer: 35 mL/min — ABNORMAL LOW (ref >60)
GFR, EST NON AFRICAN AMERICAN: 30 mL/min — AB (ref >60)
Glucose, Bld: 170 mg/dL — ABNORMAL HIGH (ref 65–99)
POTASSIUM: 3.6 mmol/L (ref 3.5–5.1)
Sodium: 135 mmol/L (ref 135–145)
TOTAL PROTEIN: 6.1 g/dL — AB (ref 6.5–8.1)

## 2015-01-21 LAB — PROTIME-INR
INR: 1.58 — ABNORMAL HIGH (ref 0.00–1.49)
Prothrombin Time: 18.9 seconds — ABNORMAL HIGH (ref 11.6–15.2)

## 2015-01-21 LAB — CBC WITH DIFFERENTIAL/PLATELET
Basophils Absolute: 0 10*3/uL (ref 0.0–0.1)
Basophils Relative: 0 %
EOS ABS: 0 10*3/uL (ref 0.0–0.7)
EOS PCT: 0 %
HCT: 39 % (ref 39.0–52.0)
Hemoglobin: 13.1 g/dL (ref 13.0–17.0)
LYMPHS ABS: 3 10*3/uL (ref 0.7–4.0)
Lymphocytes Relative: 14 %
MCH: 30.3 pg (ref 26.0–34.0)
MCHC: 33.6 g/dL (ref 30.0–36.0)
MCV: 90.1 fL (ref 78.0–100.0)
MONO ABS: 1.9 10*3/uL — AB (ref 0.1–1.0)
MONOS PCT: 8 %
Neutro Abs: 17.5 10*3/uL — ABNORMAL HIGH (ref 1.7–7.7)
Neutrophils Relative %: 78 %
PLATELETS: 133 10*3/uL — AB (ref 150–400)
RBC: 4.33 MIL/uL (ref 4.22–5.81)
RDW: 13.6 % (ref 11.5–15.5)
WBC: 22.4 10*3/uL — AB (ref 4.0–10.5)

## 2015-01-21 LAB — TROPONIN I
TROPONIN I: 0.08 ng/mL — AB (ref <0.031)
TROPONIN I: 0.31 ng/mL — AB (ref <0.031)
Troponin I: 0.09 ng/mL — ABNORMAL HIGH (ref <0.031)
Troponin I: 0.09 ng/mL — ABNORMAL HIGH (ref <0.031)

## 2015-01-21 LAB — PHOSPHORUS: PHOSPHORUS: 2.6 mg/dL (ref 2.5–4.6)

## 2015-01-21 LAB — GRAM STAIN

## 2015-01-21 LAB — GLUCOSE, CAPILLARY
GLUCOSE-CAPILLARY: 142 mg/dL — AB (ref 65–99)
GLUCOSE-CAPILLARY: 165 mg/dL — AB (ref 65–99)
Glucose-Capillary: 149 mg/dL — ABNORMAL HIGH (ref 65–99)
Glucose-Capillary: 129 mg/dL — ABNORMAL HIGH (ref 65–99)

## 2015-01-21 LAB — MAGNESIUM: Magnesium: 1.4 mg/dL — ABNORMAL LOW (ref 1.7–2.4)

## 2015-01-21 LAB — APTT: aPTT: 33 seconds (ref 24–37)

## 2015-01-21 LAB — BRAIN NATRIURETIC PEPTIDE: B NATRIURETIC PEPTIDE 5: 350 pg/mL — AB (ref 0.0–100.0)

## 2015-01-21 MED ORDER — MORPHINE SULFATE (PF) 2 MG/ML IV SOLN
1.0000 mg | INTRAVENOUS | Status: DC | PRN
  Administered 2015-01-21 – 2015-01-22 (×4): 1 mg via INTRAVENOUS
  Filled 2015-01-21 (×4): qty 1

## 2015-01-21 MED ORDER — NITROGLYCERIN 0.4 MG SL SUBL: 0.4000 mg | SUBLINGUAL_TABLET | SUBLINGUAL | Status: DC | PRN

## 2015-01-21 MED ORDER — PIPERACILLIN-TAZOBACTAM 3.375 G IVPB 30 MIN
3.3750 g | Freq: Once | INTRAVENOUS | Status: AC
  Administered 2015-01-21: 3.375 g via INTRAVENOUS
  Filled 2015-01-21: qty 50

## 2015-01-21 MED ORDER — FENTANYL CITRATE (PF) 100 MCG/2ML IJ SOLN
INTRAMUSCULAR | Status: AC | PRN
  Administered 2015-01-21: 50 ug via INTRAVENOUS

## 2015-01-21 MED ORDER — IOHEXOL 300 MG/ML  SOLN
50.0000 mL | Freq: Once | INTRAMUSCULAR | Status: AC | PRN
  Administered 2015-01-21: 10 mL via INTRAVENOUS

## 2015-01-21 MED ORDER — ASPIRIN 81 MG PO CHEW
81.0000 mg | CHEWABLE_TABLET | Freq: Every day | ORAL | Status: DC
  Administered 2015-01-21 – 2015-01-27 (×7): 81 mg via ORAL
  Filled 2015-01-21 (×7): qty 1

## 2015-01-21 MED ORDER — ADULT MULTIVITAMIN W/MINERALS CH
1.0000 | ORAL_TABLET | Freq: Every day | ORAL | Status: DC
  Administered 2015-01-22 – 2015-01-25 (×4): 1 via ORAL
  Filled 2015-01-21 (×5): qty 1

## 2015-01-21 MED ORDER — ALBUTEROL SULFATE (2.5 MG/3ML) 0.083% IN NEBU
2.5000 mg | INHALATION_SOLUTION | Freq: Four times a day (QID) | RESPIRATORY_TRACT | Status: DC
  Administered 2015-01-21: 2.5 mg via RESPIRATORY_TRACT
  Filled 2015-01-21: qty 3

## 2015-01-21 MED ORDER — ATENOLOL 50 MG PO TABS
50.0000 mg | ORAL_TABLET | Freq: Two times a day (BID) | ORAL | Status: DC
  Administered 2015-01-21 – 2015-01-27 (×13): 50 mg via ORAL
  Filled 2015-01-21 (×15): qty 1

## 2015-01-21 MED ORDER — PANTOPRAZOLE SODIUM 40 MG PO TBEC
40.0000 mg | DELAYED_RELEASE_TABLET | Freq: Every day | ORAL | Status: DC
  Administered 2015-01-21 – 2015-01-27 (×7): 40 mg via ORAL
  Filled 2015-01-21 (×7): qty 1

## 2015-01-21 MED ORDER — ACETAMINOPHEN 325 MG PO TABS
650.0000 mg | ORAL_TABLET | Freq: Once | ORAL | Status: AC
  Administered 2015-01-21: 650 mg via ORAL
  Filled 2015-01-21: qty 2

## 2015-01-21 MED ORDER — FENTANYL CITRATE (PF) 100 MCG/2ML IJ SOLN
INTRAMUSCULAR | Status: AC
  Filled 2015-01-21: qty 2

## 2015-01-21 MED ORDER — MIDAZOLAM HCL 2 MG/2ML IJ SOLN
INTRAMUSCULAR | Status: AC
  Filled 2015-01-21: qty 2

## 2015-01-21 MED ORDER — HEPARIN SODIUM (PORCINE) 5000 UNIT/ML IJ SOLN
5000.0000 [IU] | Freq: Three times a day (TID) | INTRAMUSCULAR | Status: DC
  Administered 2015-01-21 – 2015-01-27 (×17): 5000 [IU] via SUBCUTANEOUS
  Filled 2015-01-21 (×16): qty 1

## 2015-01-21 MED ORDER — SIMVASTATIN 20 MG PO TABS
20.0000 mg | ORAL_TABLET | Freq: Every day | ORAL | Status: DC
  Administered 2015-01-21 – 2015-01-26 (×6): 20 mg via ORAL
  Filled 2015-01-21 (×6): qty 1

## 2015-01-21 MED ORDER — SODIUM CHLORIDE 0.9 % IV SOLN
INTRAVENOUS | Status: DC
  Administered 2015-01-21: 1000 mL via INTRAVENOUS

## 2015-01-21 MED ORDER — PIPERACILLIN-TAZOBACTAM 3.375 G IVPB
3.3750 g | Freq: Three times a day (TID) | INTRAVENOUS | Status: DC
  Administered 2015-01-21 – 2015-01-23 (×6): 3.375 g via INTRAVENOUS
  Filled 2015-01-21 (×9): qty 50

## 2015-01-21 MED ORDER — MIDAZOLAM HCL 2 MG/2ML IJ SOLN
INTRAMUSCULAR | Status: AC | PRN
  Administered 2015-01-21: 1 mg via INTRAVENOUS

## 2015-01-21 MED ORDER — SODIUM CHLORIDE 0.9 % IJ SOLN
3.0000 mL | Freq: Two times a day (BID) | INTRAMUSCULAR | Status: DC
  Administered 2015-01-21 – 2015-01-26 (×10): 3 mL via INTRAVENOUS

## 2015-01-21 MED ORDER — ALBUTEROL SULFATE (2.5 MG/3ML) 0.083% IN NEBU
2.5000 mg | INHALATION_SOLUTION | RESPIRATORY_TRACT | Status: DC | PRN
  Administered 2015-01-22 – 2015-01-25 (×2): 2.5 mg via RESPIRATORY_TRACT
  Filled 2015-01-21 (×2): qty 3

## 2015-01-21 MED ORDER — ACETAMINOPHEN 650 MG RE SUPP: 650.0000 mg | Freq: Four times a day (QID) | RECTAL | Status: DC | PRN

## 2015-01-21 MED ORDER — PIPERACILLIN-TAZOBACTAM 3.375 G IVPB 30 MIN: 3.3750 g | Freq: Once | INTRAVENOUS | Status: DC

## 2015-01-21 MED ORDER — OMEGA-3-ACID ETHYL ESTERS 1 G PO CAPS
1.0000 g | ORAL_CAPSULE | Freq: Every day | ORAL | Status: DC
  Administered 2015-01-21 – 2015-01-27 (×7): 1 g via ORAL
  Filled 2015-01-21 (×7): qty 1

## 2015-01-21 MED ORDER — AMLODIPINE BESYLATE 5 MG PO TABS
5.0000 mg | ORAL_TABLET | Freq: Every day | ORAL | Status: DC
Start: 2015-01-21 — End: 2015-01-27
  Administered 2015-01-21 – 2015-01-27 (×7): 5 mg via ORAL
  Filled 2015-01-21 (×7): qty 1

## 2015-01-21 MED ORDER — ACETAMINOPHEN 325 MG PO TABS
650.0000 mg | ORAL_TABLET | Freq: Four times a day (QID) | ORAL | Status: DC | PRN
  Administered 2015-01-21 – 2015-01-27 (×2): 650 mg via ORAL
  Filled 2015-01-21 (×2): qty 2

## 2015-01-21 MED ORDER — INSULIN ASPART 100 UNIT/ML ~~LOC~~ SOLN
0.0000 [IU] | Freq: Three times a day (TID) | SUBCUTANEOUS | Status: DC
  Administered 2015-01-22 – 2015-01-23 (×4): 2 [IU] via SUBCUTANEOUS
  Administered 2015-01-23: 3 [IU] via SUBCUTANEOUS
  Administered 2015-01-23 – 2015-01-25 (×5): 2 [IU] via SUBCUTANEOUS
  Administered 2015-01-25: 3 [IU] via SUBCUTANEOUS
  Administered 2015-01-25 – 2015-01-26 (×2): 2 [IU] via SUBCUTANEOUS
  Administered 2015-01-26: 1 [IU] via SUBCUTANEOUS
  Administered 2015-01-26: 3 [IU] via SUBCUTANEOUS
  Administered 2015-01-27 (×2): 2 [IU] via SUBCUTANEOUS

## 2015-01-21 MED ORDER — INSULIN ASPART 100 UNIT/ML ~~LOC~~ SOLN
0.0000 [IU] | SUBCUTANEOUS | Status: DC
  Administered 2015-01-21 (×3): 1 [IU] via SUBCUTANEOUS

## 2015-01-21 NOTE — ED Notes (Signed)
Pt assisted to bedside commode; pt given call bell to use to notify staff when done

## 2015-01-21 NOTE — Progress Notes (Signed)
Received pt report from [place].

## 2015-01-21 NOTE — ED Notes (Signed)
Attempted to call report

## 2015-01-21 NOTE — ED Notes (Signed)
MD aware of troponin and lactic acid results.

## 2015-01-21 NOTE — Consult Note (Signed)
Reason for Consult:abd pain Referring Physician: dr Nathan [name]  Nathan [name] is an 81 y.o. male.  HPI: 80 yom with history of cad (cabg a number of years ago followed by Dr Acie Fredrickson), dm first had epigastric pain on Saturday.  He came to er over the weekend and had ct and [place].  Ct concerning for gb disease but [place] negative and went home. This has worsened since then and has ruq pain that hurts when he breathes now.  He has loa, nausea, and decreased uop as well.  Never had this pain before he stays.     Past Medical History  Diagnosis Date  . Hyperlipidemia   . Coronary artery disease 2002    CABG x5  . Diabetes mellitus   . Hypercholesteremia   . Mitral valve regurgitation   . Aortic insufficiency   . Skin cancer     tumor removed off of his back  . Acid reflux   . DVT (deep venous thrombosis) ([place])     previously on coumadin    Past Surgical History  Procedure Laterality Date  . Coronary artery bypass graft  12/21/2000    x5  . Tumor removal      off of back, skin cancer  . Skin cancer excision      Family History  Problem Relation Age of Onset  . Heart attack Father   . Hypertension Mother   . Diabetes Brother   . Cancer Sister     breast  . Cancer Brother     throat & mouth    Social History:  reports that he has never smoked. He does not have any smokeless tobacco history on file. He reports that he does not drink alcohol or use illicit drugs.  Allergies: No Known Allergies  Medications: I have reviewed the patient's current medications.  Results for orders placed or performed during the hospital encounter of 01/20/15 (from the past 48 hour(s))  CBG monitoring, ED     Status: Abnormal   Collection Time: 01/20/15  7:58 PM  Result Value Ref Range   Glucose-Capillary 187 (H) 65 - 99 mg/dL  Urinalysis, Routine w reflex microscopic (not at [facility])     Status: Abnormal   Collection Time: 01/20/15  8:32 PM  Result Value Ref Range   Color, Urine AMBER (A) YELLOW     Comment: BIOCHEMICALS MAY BE AFFECTED BY COLOR   APPearance CLEAR CLEAR   Specific Gravity, Urine 1.025 1.005 - 1.030   pH 5.5 5.0 - 8.0   Glucose, UA 500 (A) NEGATIVE mg/dL   Hgb urine dipstick MODERATE (A) NEGATIVE   Bilirubin Urine SMALL (A) NEGATIVE   Ketones, ur 15 (A) NEGATIVE mg/dL   Protein, ur >300 (A) NEGATIVE mg/dL   Nitrite NEGATIVE NEGATIVE   Leukocytes, UA NEGATIVE NEGATIVE  Urine microscopic-add on     Status: Abnormal   Collection Time: 01/20/15  8:32 PM  Result Value Ref Range   Squamous Epithelial / LPF 0-5 (A) NONE SEEN   WBC, UA 0-5 0 - 5 WBC/hpf   RBC / HPF 0-5 0 - 5 RBC/hpf   Bacteria, UA FEW (A) NONE SEEN   Casts GRANULAR CAST (A) NEGATIVE   Urine-Other MUCOUS PRESENT     Comment: YEAST PRESENT  Lipase, blood     Status: None   Collection Time: 01/20/15  8:34 PM  Result Value Ref Range   Lipase 22 11 - 51 U/L  Comprehensive metabolic panel  Status: Abnormal   Collection Time: 01/20/15  8:34 PM  Result Value Ref Range   Sodium 135 135 - 145 mmol/L   Potassium 4.2 3.5 - 5.1 mmol/L    Comment: DELTA CHECK NOTED NO VISIBLE HEMOLYSIS    Chloride 104 101 - 111 mmol/L   CO2 22 22 - 32 mmol/L   Glucose, Bld 179 (H) 65 - 99 mg/dL   BUN 26 (H) 6 - 20 mg/dL   Creatinine, Ser 2.02 (H) 0.61 - 1.24 mg/dL   Calcium 9.2 8.9 - 10.3 mg/dL   Total Protein 7.0 6.5 - 8.1 g/dL   Albumin 3.1 (L) 3.5 - 5.0 g/dL   AST 31 15 - 41 U/L   ALT 20 17 - 63 U/L   Alkaline Phosphatase 54 38 - 126 U/L   Total Bilirubin 2.1 (H) 0.3 - 1.2 mg/dL   GFR calc non Af Amer 30 (L) >60 mL/min   GFR calc Af Amer 34 (L) >60 mL/min    Comment: (NOTE) The eGFR has been calculated using the CKD EPI equation. This calculation has not been validated in all clinical situations. eGFR's persistently <60 mL/min signify possible Chronic Kidney Disease.    Anion gap 9 5 - 15  CBC     Status: Abnormal   Collection Time: 01/20/15  8:34 PM  Result Value Ref Range   WBC 24.4 (H) 4.0 - 10.5 K/uL    RBC 4.78 4.22 - 5.81 MIL/uL   Hemoglobin 14.5 13.0 - 17.0 g/dL   HCT 42.4 39.0 - 52.0 %   MCV 88.7 78.0 - 100.0 fL   MCH 30.3 26.0 - 34.0 pg   MCHC 34.2 30.0 - 36.0 g/dL   RDW 13.4 11.5 - 15.5 %   Platelets 164 150 - 400 K/uL  Troponin I     Status: Abnormal   Collection Time: 01/20/15 11:33 PM  Result Value Ref Range   Troponin I 0.09 (H) <0.031 ng/mL    Comment:        PERSISTENTLY INCREASED TROPONIN VALUES IN THE RANGE OF 0.04-0.49 ng/mL CAN BE SEEN IN:       -UNSTABLE ANGINA       -CONGESTIVE HEART FAILURE       -MYOCARDITIS       -CHEST TRAUMA       -ARRYHTHMIAS       -LATE PRESENTING MYOCARDIAL INFARCTION       -COPD   CLINICAL FOLLOW-UP RECOMMENDED.   Lactic acid, plasma     Status: Abnormal   Collection Time: 01/20/15 11:33 PM  Result Value Ref Range   Lactic Acid, Venous 2.1 (HH) 0.5 - 2.0 mmol/L    Comment: CRITICAL RESULT CALLED TO, READ BACK BY AND VERIFIED WITH: B Marina Gravel [id number]   Lactic acid, plasma     Status: None   Collection Time: 01/21/15  2:42 AM  Result Value Ref Range   Lactic Acid, Venous 1.7 0.5 - 2.0 mmol/L    Ct Abdomen Pelvis Wo Contrast  01/21/2015  CLINICAL DATA:  81 year old male with abdominal pain EXAM: CT ABDOMEN AND PELVIS WITHOUT CONTRAST TECHNIQUE: Multidetector CT imaging of the abdomen and pelvis was performed following the standard protocol without IV contrast. COMPARISON:  Abdominal CT and ultrasound dated 01/19/2015 FINDINGS: Evaluation of this exam is limited in the absence of intravenous contrast. Linear bibasilar subsegmental atelectasis/ scarring. There is advanced coronary vascular calcification with CABG clips. No intra-abdominal free air or free fluid. Diffuse hepatic steatosis. The gallbladder  is moderately distended. There is thickening and inflammatory changes of the gallbladder wall with stranding of the pericholecystic fat most compatible with acute cholecystitis. There has been interval progression of  the inflammatory changes involving the gallbladder compared to the prior study. No calcified stone identified. Ultrasound is recommended for further evaluation of the gallbladder. The pancreas, spleen, and adrenal glands appear unremarkable. Small parapelvic renal cysts again noted. There is no hydronephrosis or nephrolithiasis. The visualized ureters and urinary bladder appear unremarkable. The prostate gland is enlarged measuring up to 5.7 cm transverse axial dimension. There is sigmoid diverticulosis without active inflammation. There is segmental inflammatory changes and thickening of the second portion of the duodenum, likely reactive to inflammatory changes of the gallbladder. Multiple duodenal diverticula noted measuring up to 1 point 5 cm in the distal duodenum. A smaller duodenal diverticulum is noted in the second portion of the duodenum with surrounding inflammation. This inflammatory changes are likely reactive to inflammatory changes of the gallbladder and less likely represent duodenal diverticulitis. There is no evidence of bowel obstruction. Normal appendix. There is aortoiliac atherosclerotic disease. No portal venous gas identified. There is no adenopathy. The abdominal wall soft tissues appear grossly unremarkable. There is degenerative changes of the spine. There are bilateral L5 pars defects with grade II L5-S1 anterolisthesis. No acute fracture. IMPRESSION: Interval worsening of the inflammatory changes involving the gallbladder concerning for acute cholecystitis. Ultrasound is recommended for better evaluation of the gallbladder. No calcified gallstone identified. Segmental inflammatory changes and thickening of the second portion of duodenum likely reactive to inflammatory changes of the gallbladder. A small duodenal diverticula with stranding inflammation also likely reactive to inflammatory changes of the gallbladder. Duodenal diverticulitis is less likely. Sigmoid diverticulosis. No  evidence of bowel obstruction. Normal appendix. Electronically Signed   By: Anner Crete M.D.   On: 01/21/2015 02:21   Ct Abdomen Pelvis Wo Contrast  01/19/2015  CLINICAL DATA:  Mid abdominal pain with nausea and vomiting since last night. EXAM: CT ABDOMEN AND PELVIS WITHOUT CONTRAST TECHNIQUE: Multidetector CT imaging of the abdomen and pelvis was performed following the standard protocol without IV contrast. COMPARISON:  CT abdomen and pelvis dated 08/28/2010. FINDINGS: Gallbladder is moderately distended. Gallbladder walls are at least mildly thickened and indistinct with subtle pericholecystic edema suggesting acute cholecystitis. Liver, spleen, pancreas, and adrenal glands are unremarkable. Parapelvic renal cysts again noted bilaterally. Kidneys otherwise unremarkable without stone or hydronephrosis. No ureteral or bladder calculi identified. Bladder is moderately distended but otherwise unremarkable. Prostate gland is moderately enlarged causing some mass effect on the bladder base. Bowel is normal in caliber. Scattered diverticulosis noted within the sigmoid colon without evidence of acute diverticulitis. Appendix is normal. No free fluid or abscess collection. No free intraperitoneal air. No enlarged lymph nodes seen. Atherosclerotic changes are seen along the walls of the normal-caliber abdominal aorta. There is small hiatal hernia. Mild scarring/atelectasis at the adjacent lung bases. Small benign granulomas also noted at the right lung base. Fairly extensive coronary artery calcifications noted at the heart base. Median sternotomy wires in place, presumably related to a previous CABG. Scattered degenerative changes are seen throughout the thoracolumbar spine, moderate in degree. Chronic pars interarticularis defects noted at the L5-S1 level with associated grade 2 anterolisthesis of L5 on S1. No acute- appearing osseous abnormality. IMPRESSION: 1. Suspect acute cholecystitis. Recommend gallbladder  ultrasound and/or nuclear medicine HIDA scan for confirmation. 2. Prostate enlargement, similar to previous CT, with associated mass effect on the bladder base. 3. Small hiatal  hernia. 4. Colonic diverticulosis without evidence of acute diverticulitis. 5. Bilateral chronic pars interarticularis defects at L5-S1 with resultant grade 2 spondylolisthesis of L5 on S1. This was also described on the previous CT report. These results were called by telephone at the time of interpretation on 01/19/2015 at 7:20 am to Dr. [name] , who verbally acknowledged these results. Electronically Signed   By: Franki Cabot M.D.   On: 01/19/2015 07:22   Dg Chest 2 View  01/21/2015  CLINICAL DATA:  Worsening right lower quadrant abdominal pain, hypoxia and leukocytosis, acute onset. Initial encounter. EXAM: CHEST  2 VIEW COMPARISON:  Chest radiograph performed 08/28/2010 FINDINGS: The lungs are hypoexpanded, with elevation of the right hemidiaphragm. Mild bilateral atelectasis is suggested. Vascular crowding is seen. No definite pleural effusion or pneumothorax is identified. The cardiomediastinal silhouette is mildly enlarged. The patient is status post median sternotomy. No acute osseous abnormalities are identified. IMPRESSION: 1. Lungs hypoexpanded, with elevation of the right hemidiaphragm. Mild bilateral atelectasis suggested. 2. Mild cardiomegaly. Electronically Signed   By: Garald Balding M.D.   On: 01/21/2015 00:39   US Abdomen Limited Ruq  01/19/2015  CLINICAL DATA:  Right upper quadrant abdominal pain. Nausea and vomiting. EXAM: US ABDOMEN LIMITED - RIGHT UPPER QUADRANT COMPARISON:  CT 01/19/2015.  Ultrasound 12/03/2014. FINDINGS: Gallbladder: No gallstones or wall thickening visualized. No sonographic Murphy sign noted. Common bile duct: Diameter: 3.3 mm Liver: No focal lesion identified. Within normal limits in parenchymal echogenicity. Evaluation is mildly limited by bowel gas. IMPRESSION: Negative right upper  quadrant ultrasound. No evidence of cholecystitis or biliary dilatation. Electronically Signed   By: Richardean Sale M.D.   On: 01/19/2015 08:16    Review of Systems  Constitutional: Positive for fever and chills.  Respiratory: Positive for shortness of breath.   Cardiovascular: Negative for chest pain.  Gastrointestinal: Positive for nausea and abdominal pain. Negative for vomiting and constipation.  Genitourinary: Negative for dysuria and urgency.  Musculoskeletal: Positive for back pain.   Blood pressure 112/56, pulse 63, temperature 100.2 F (37.9 C), temperature source Rectal, resp. rate 17, height 6' (1.829 m), weight 87.998 kg (194 lb), SpO2 96 %. Physical Exam  Vitals reviewed. Constitutional: He appears well-developed and well-nourished.  HENT:  Head: Normocephalic and atraumatic.  Eyes: No scleral icterus.  Neck: Neck supple.  Cardiovascular: Normal rate and regular rhythm.   Respiratory: Breath sounds normal.  GI: Soft. Normal appearance. Bowel sounds are decreased. There is tenderness in the right upper quadrant.  Skin: He is diaphoretic.    Assessment/Plan: Cholecystitis  His [place] did not show stones this weekend but with ct and exam it clinically appears as cholecystitis.  His cr and troponin are both now elevated also.  I think best course at this point given sirs is to consult ir for perc chole.  Would follow up br in am. Continue zosyn.  Hopefully drain will take care of infection and needs to be seen by cardiology (had a stress test planned soon) as he will likely need gb out once this resolves which will be in 6 weeks.    Disa Riedlinger 01/21/2015, 6:45 AM

## 2015-01-21 NOTE — H&P (Signed)
Chief Complaint: Patient was seen in consultation today for acute cholecystitis  Chief Complaint  Patient presents with  . Abdominal Pain    The patient said he has been having abdominal pain but denies nausea, vomiting and diarrhea.  He says he has lost his appetite and is not peeing good.     at the request of [facility] Dr. Donne Hazel  Referring Physician(s): [facility] Dr. Donne Hazel  History of Present Illness: Nathan [name] is a 81 y.o. male who presented to ED 12/4 after having symptoms starting the evening of 12/3 with acute constant 10/10 RUQ/epigastric abdominal pain, he states he recently ate a ham and cheese sandwich and cookies just prior to the event and was sitting watching football. He also complains of overall weakness and fatigue with loss of appetite following the symptoms. [place] and CT done in the ED 12/4 [place] without evidence for cholecystitis. He was discharged home and presented back to ED on 12/5 with continued pain and overall fatigue with no appetite. He denies any nausea or vomiting. He denies any documented fevers. Repeat CT done 12/6 revealed interval worsening of the inflammatory changes involving gallbladder with concern for acute cholecystitis. He presented 12/6 with a fever and labs with leukocytosis and elevated troponin. The patient denies any chest pain and has a history of CABG 14 years ago in which he was asymptomatic at that time. He follows with Dr. Acie Fredrickson and was scheduled for a stress test this week as an outpatient mainly for follow-up reasons. He denies any worsening shortness of breath or chest pain.   IR has been consulted for percutaneous cholecystostomy tube placement. The patient states his RUQ pain today is 2/10, he denies any nausea or vomiting. He overall feels the same as yesterday with fatigue. He denies any active signs of bleeding or excessive bruising. The patient denies any history of sleep apnea or chronic oxygen use. He has previously tolerated sedation  without complications.    Past Medical History  Diagnosis Date  . Hyperlipidemia   . Coronary artery disease 2002    CABG x5  . Diabetes mellitus   . Hypercholesteremia   . Mitral valve regurgitation   . Aortic insufficiency   . Skin cancer     tumor removed off of his back  . Acid reflux   . DVT (deep venous thrombosis) ([place])     previously on coumadin    Past Surgical History  Procedure Laterality Date  . Coronary artery bypass graft  12/21/2000    x5  . Tumor removal      off of back, skin cancer  . Skin cancer excision      Allergies: Review of patient's allergies indicates no known allergies.  Medications: Prior to Admission medications   Medication Sig Start Date End Date Taking? Authorizing Provider  amLODipine (NORVASC) 5 MG tablet Take 5 mg by mouth daily.     Yes Historical Provider, MD  aspirin 81 MG tablet Take 81 mg by mouth daily.   Yes Historical Provider, MD  atenolol (TENORMIN) 50 MG tablet Take 50 mg by mouth 2 (two) times daily.    Yes Historical Provider, MD  insulin NPH-regular Human (NOVOLIN 70/30) (70-30) 100 UNIT/ML injection Inject 22-36 Units into the skin See admin instructions. Take 36 units in the morning and 22 units at bedtime.   Yes Historical Provider, MD  multivitamin [facility]) per tablet Take 1 tablet by mouth daily.     Yes Historical Provider, MD  Omega-3 Fatty Acids (FISH OIL) 1200 MG CAPS Take 2 capsules by mouth daily.    Yes Historical Provider, MD  omeprazole (PRILOSEC) 20 MG capsule Take 20 mg by mouth daily.  01/06/11  Yes Historical Provider, MD  pioglitazone (ACTOS) 15 MG tablet Take 15 mg by mouth daily.   Yes Historical Provider, MD  simvastatin (ZOCOR) 20 MG tablet Take 20 mg by mouth at bedtime.     Yes Historical Provider, MD     Family History  Problem Relation Age of Onset  . Heart attack Father   . Hypertension Mother   . Diabetes Brother   . Cancer Sister     breast  . Cancer Brother     throat & mouth     Social History   Social History  . Marital Status: Divorced    Spouse Name: N/A  . Number of Children: N/A  . Years of Education: N/A   Social History Main Topics  . Smoking status: Never Smoker   . Smokeless tobacco: None  . Alcohol Use: No  . Drug Use: No  . Sexual Activity: Not Currently   Other Topics Concern  . None   Social History Narrative    Review of Systems: A 12 point ROS discussed and pertinent positives are indicated in the HPI above.  All other systems are negative.  Review of Systems  Vital Signs: BP 151/66 mmHg  Pulse 66  Temp(Src) 98 F (36.7 C) (Oral)  Resp 18  Ht 6' (1.829 m)  Wt 191 lb 9.3 oz (86.9 kg)  BMI 25.98 kg/m2  SpO2 93%  Physical Exam  Constitutional: He is oriented to person, place, and time. No distress.  HENT:  Head: Normocephalic and atraumatic.  Neck: No tracheal deviation present.  Cardiovascular: Normal rate and regular rhythm.  Exam reveals no gallop and no friction rub.   No murmur heard. Pulmonary/Chest: Effort normal and breath sounds normal. No respiratory distress. He has no wheezes. He has no rales.  Abdominal: Soft. Bowel sounds are normal. He exhibits no distension. There is tenderness.  RUQ TTP  Neurological: He is alert and oriented to person, place, and time.  Skin: Skin is warm and dry. He is not diaphoretic.    Mallampati Score:  MD Evaluation Airway: WNL Heart: WNL Abdomen: WNL Chest/ Lungs: WNL ASA  Classification: 3 Mallampati/Airway Score: Two  Imaging: Ct Abdomen Pelvis Wo Contrast  01/21/2015  CLINICAL DATA:  81 year old male with abdominal pain EXAM: CT ABDOMEN AND PELVIS WITHOUT CONTRAST TECHNIQUE: Multidetector CT imaging of the abdomen and pelvis was performed following the standard protocol without IV contrast. COMPARISON:  Abdominal CT and ultrasound dated 01/19/2015 FINDINGS: Evaluation of this exam is limited in the absence of intravenous contrast. Linear bibasilar subsegmental  atelectasis/ scarring. There is advanced coronary vascular calcification with CABG clips. No intra-abdominal free air or free fluid. Diffuse hepatic steatosis. The gallbladder is moderately distended. There is thickening and inflammatory changes of the gallbladder wall with stranding of the pericholecystic fat most compatible with acute cholecystitis. There has been interval progression of the inflammatory changes involving the gallbladder compared to the prior study. No calcified stone identified. Ultrasound is recommended for further evaluation of the gallbladder. The pancreas, spleen, and adrenal glands appear unremarkable. Small parapelvic renal cysts again noted. There is no hydronephrosis or nephrolithiasis. The visualized ureters and urinary bladder appear unremarkable. The prostate gland is enlarged measuring up to 5.7 cm transverse axial dimension. There is sigmoid diverticulosis without active inflammation.  There is segmental inflammatory changes and thickening of the second portion of the duodenum, likely reactive to inflammatory changes of the gallbladder. Multiple duodenal diverticula noted measuring up to 1 point 5 cm in the distal duodenum. A smaller duodenal diverticulum is noted in the second portion of the duodenum with surrounding inflammation. This inflammatory changes are likely reactive to inflammatory changes of the gallbladder and less likely represent duodenal diverticulitis. There is no evidence of bowel obstruction. Normal appendix. There is aortoiliac atherosclerotic disease. No portal venous gas identified. There is no adenopathy. The abdominal wall soft tissues appear grossly unremarkable. There is degenerative changes of the spine. There are bilateral L5 pars defects with grade II L5-S1 anterolisthesis. No acute fracture. IMPRESSION: Interval worsening of the inflammatory changes involving the gallbladder concerning for acute cholecystitis. Ultrasound is recommended for better  evaluation of the gallbladder. No calcified gallstone identified. Segmental inflammatory changes and thickening of the second portion of duodenum likely reactive to inflammatory changes of the gallbladder. A small duodenal diverticula with stranding inflammation also likely reactive to inflammatory changes of the gallbladder. Duodenal diverticulitis is less likely. Sigmoid diverticulosis. No evidence of bowel obstruction. Normal appendix. Electronically Signed   By: Anner Crete M.D.   On: 01/21/2015 02:21   Ct Abdomen Pelvis Wo Contrast  01/19/2015  CLINICAL DATA:  Mid abdominal pain with nausea and vomiting since last night. EXAM: CT ABDOMEN AND PELVIS WITHOUT CONTRAST TECHNIQUE: Multidetector CT imaging of the abdomen and pelvis was performed following the standard protocol without IV contrast. COMPARISON:  CT abdomen and pelvis dated 08/28/2010. FINDINGS: Gallbladder is moderately distended. Gallbladder walls are at least mildly thickened and indistinct with subtle pericholecystic edema suggesting acute cholecystitis. Liver, spleen, pancreas, and adrenal glands are unremarkable. Parapelvic renal cysts again noted bilaterally. Kidneys otherwise unremarkable without stone or hydronephrosis. No ureteral or bladder calculi identified. Bladder is moderately distended but otherwise unremarkable. Prostate gland is moderately enlarged causing some mass effect on the bladder base. Bowel is normal in caliber. Scattered diverticulosis noted within the sigmoid colon without evidence of acute diverticulitis. Appendix is normal. No free fluid or abscess collection. No free intraperitoneal air. No enlarged lymph nodes seen. Atherosclerotic changes are seen along the walls of the normal-caliber abdominal aorta. There is small hiatal hernia. Mild scarring/atelectasis at the adjacent lung bases. Small benign granulomas also noted at the right lung base. Fairly extensive coronary artery calcifications noted at the heart  base. Median sternotomy wires in place, presumably related to a previous CABG. Scattered degenerative changes are seen throughout the thoracolumbar spine, moderate in degree. Chronic pars interarticularis defects noted at the L5-S1 level with associated grade 2 anterolisthesis of L5 on S1. No acute- appearing osseous abnormality. IMPRESSION: 1. Suspect acute cholecystitis. Recommend gallbladder ultrasound and/or nuclear medicine HIDA scan for confirmation. 2. Prostate enlargement, similar to previous CT, with associated mass effect on the bladder base. 3. Small hiatal hernia. 4. Colonic diverticulosis without evidence of acute diverticulitis. 5. Bilateral chronic pars interarticularis defects at L5-S1 with resultant grade 2 spondylolisthesis of L5 on S1. This was also described on the previous CT report. These results were called by telephone at the time of interpretation on 01/19/2015 at 7:20 am to Dr. [name] , who verbally acknowledged these results. Electronically Signed   By: Franki Cabot M.D.   On: 01/19/2015 07:22   Dg Chest 2 View  01/21/2015  CLINICAL DATA:  Worsening right lower quadrant abdominal pain, hypoxia and leukocytosis, acute onset. Initial encounter. EXAM: CHEST  2 VIEW COMPARISON:  Chest radiograph performed 08/28/2010 FINDINGS: The lungs are hypoexpanded, with elevation of the right hemidiaphragm. Mild bilateral atelectasis is suggested. Vascular crowding is seen. No definite pleural effusion or pneumothorax is identified. The cardiomediastinal silhouette is mildly enlarged. The patient is status post median sternotomy. No acute osseous abnormalities are identified. IMPRESSION: 1. Lungs hypoexpanded, with elevation of the right hemidiaphragm. Mild bilateral atelectasis suggested. 2. Mild cardiomegaly. Electronically Signed   By: Garald Balding M.D.   On: 01/21/2015 00:39   Dg Chest Port 1 View  01/21/2015  CLINICAL DATA:  81 year old male with fever and shortness breath. Sepsis.  EXAM: PORTABLE CHEST 1 VIEW COMPARISON:  Chest x-ray 01/20/2015. FINDINGS: Lung volumes are very low. There are some ill-defined bibasilar opacities which may reflect areas of atelectasis and/or airspace consolidation (left greater than right). Mild blunting of left costophrenic sulcus, suggestive of a small left pleural effusion. No evidence of pulmonary edema. Heart size is within normal limits. Upper mediastinal contours are normal. Atherosclerosis in the thoracic aorta. Status post median sternotomy for CABG. IMPRESSION: 1. Llow lung volumes with bibasilar (left greater than right) areas of atelectasis and/or airspace consolidation, and small left pleural effusion. Electronically Signed   By: Vinnie Langton M.D.   On: 01/21/2015 07:42   US Abdomen Limited Ruq  01/19/2015  CLINICAL DATA:  Right upper quadrant abdominal pain. Nausea and vomiting. EXAM: US ABDOMEN LIMITED - RIGHT UPPER QUADRANT COMPARISON:  CT 01/19/2015.  Ultrasound 12/03/2014. FINDINGS: Gallbladder: No gallstones or wall thickening visualized. No sonographic Murphy sign noted. Common bile duct: Diameter: 3.3 mm Liver: No focal lesion identified. Within normal limits in parenchymal echogenicity. Evaluation is mildly limited by bowel gas. IMPRESSION: Negative right upper quadrant ultrasound. No evidence of cholecystitis or biliary dilatation. Electronically Signed   By: Richardean Sale M.D.   On: 01/19/2015 08:16    Labs:  CBC:  Recent Labs  01/19/15 0033 01/20/15 2034 01/21/15 0815  WBC 13.1* 24.4* 22.4*  HGB 14.7 14.5 13.1  HCT 42.4 42.4 39.0  PLT 179 164 133*    COAGS:  Recent Labs  01/21/15 0815  INR 1.58*  APTT 33    BMP:  Recent Labs  01/19/15 0033 01/20/15 2034 01/21/15 0815  NA 138 135 135  K 3.3* 4.2 3.6  CL 107 104 107  CO2 20* 22 20*  GLUCOSE 230* 179* 170*  BUN 23* 26* 27*  CALCIUM 9.4 9.2 8.1*  CREATININE 1.74* 2.02* 2.01*  GFRNONAA 36* 30* 30*  GFRAA 41* 34* 35*    LIVER FUNCTION  TESTS:  Recent Labs  01/19/15 0033 01/20/15 2034 01/21/15 0815  BILITOT 0.8 2.1* 1.6*  AST 26 31 25   ALT 21 20 16*  ALKPHOS 75 54 47  PROT 7.9 7.0 6.1*  ALBUMIN 3.8 3.1* 2.6*    Assessment and Plan: RUQ/epigastric abdominal pain x 4 days Leukocytosis with fever on IV Zosyn now CT 12/6 with interval worsening of the inflammatory changes involving gallbladder with concern for acute cholecystitis IR consulted  CT imaging reviewed by Dr. Kathlene Cote today and based on CT he is ok to move forward with image guided percutaneous cholecystostomy tube placement today without HIDA- discussed with Saverio Danker from [facility] today and I will cancel HIDA The patient has been NPO, sq heparin was given at 0940 today- we will hold remaining doses and perform procedure later today, labs and vitals have been reviewed. Risks and Benefits discussed with the patient including, but not limited to bleeding, infection,  gallbladder perforation, bile leak, sepsis or even death. All of the patient's questions were answered, patient is agreeable to proceed. Consent signed and in chart. Elevated Troponin, trending down  History of CABG 2002- denies any chest pain, follows with Dr. Acie Fredrickson  DM  HLP   Thank you for this interesting consult.  I greatly enjoyed meeting [facility] and look forward to participating in their care.  A copy of this report was sent to the requesting provider on this date.  SignedHedy Jacob 01/21/2015, 11:23 AM   I spent a total of 40 Minutes in face to face in clinical consultation, greater than 50% of which was counseling/coordinating care for acute cholecystis.

## 2015-01-21 NOTE — Progress Notes (Addendum)
[facility] PROGRESS NOTE  Nathan [name] [id number] DOB: 04/29/1933 DOA: 01/20/2015 PCP: Nathan Lopes, MD  Brief Summary  Nathan [name] is a 81 year old male with a past medical history significant for diabetes mellitus type 2, HLD, CAD s/p CABG; who presented with progressively worsening abdominal pain for 3 days.  In the ER, fever of 101.30F. Initial lab work showed a WBC of 24.4 and lactic acid level of 2.1 CT scan demonstrated acute cholecystitis.  He was started on zosyn.  Surgery recommended conservative management given the patient's CAD/comorbidities.    Assessment/Plan  Sepsis secondary to Acute cholecystitis (fever, leukocytosis, tachypnea), Fever trending down on antibiotics.  Leukocytosis stable.   -  Continue zosyn -  Cholecystostomy drain placed on 12/6 -  Appreciate interventional radiology assistance -  Blood cultures pending -  Biliary culture pending  Coronary artery disease S/P CABG, was previously asymptomatic with MI: Patient was scheduled to have a myocardial perfusion imaging study on 01/27/2015 by Dr. Acie [name].  Elevated cardiac enzymes: Initial troponin 0.09 and rising somewhat -  Continue ASA, statin, BB -  Add prn nitroglycerin -  ECHO -  Continue to trend troponins until trending down -  If continuing to rise dramatically, consider heparinizing and calling cardiology  -  F/u with cardiology for stress test.  -  ECG:  NSR with RBBB, no ST segment elevations  - Repeat EKG in am - telemetry: Atrial rhythm with variable PR interval, rate controlled  Acute kidney injury versus chronic kidney disease, creatinine 1.5 approximately one year ago -  Creatinine stable around 2 -  Dc IVF  Diabetes mellitus type 2 with complications (Nathan [name]): Last hemoglobin A1c unknown at this time. - Held Actos and 70/30 regimen of insulin - CBG well controlled -  Change to ACHS insulin  Essential Hypertension, labile BPs -Continue amlodipine and  atenolol  Hypercholesteremia -Continue simvastatin  GERD -Home medication of Prilosec changed to Protonix per pharmacy   Diet:  CLD post-procedure and advance to diabetic, healthy heart when tolerating clears Access:  PIV IVF:  Yes until tolerating diet Proph:  Heparin   Code Status: full Family Communication: patient alone Disposition Plan:  Pending clinical improvement after cholecystomy drain placed.   Consultants:  IR  General surgery  Procedures:  CT abd/pelvis  Perc cholecystostomy drain on 12/6   Antibiotics:  Zosyn 12/5 >    HPI/Subjective:  Patient states that he continues to have pain in the epigastrium, RLQ, and RUQ.  Nausea without vomiting.  Feels somewhat SOB without obvious wheeze or rhonchi.    Objective: Filed Vitals:   01/21/15 1541 01/21/15 1545 01/21/15 1551 01/21/15 1623  BP: 121/66 159/72 162/71 176/73  Pulse: 75 74 78 74  Temp:    98.4 F (36.9 C)  TempSrc:      Resp: 23 32 28 26  Height:      Weight:      SpO2: 92% 92% 92% 92%    Intake/Output Summary (Last 24 hours) at 01/21/15 1804 Last data filed at 01/21/15 1801  Gross per 24 hour  Intake      0 ml  Output    302 ml  Net   -302 ml   Filed Weights   01/20/15 1954 01/21/15 0713  Weight: 87.998 kg (194 lb) 86.9 kg (191 lb 9.3 oz)   Body mass index is 25.98 kg/(m^2).  Exam:   General:  Adult male, tachypneic with SCM and subcostal retractions  HEENT:  NCAT, MMM  Cardiovascular:  RRR,  nl S1, S2 no mrg, 2+ pulses, warm extremities  Respiratory:  Diminished bilateral BS with rales at the bases, no rhonchi or wheeze  Abdomen:   NABS, soft, ND, TTP in the RUQ without rebound or guarding  MSK:   Normal tone and bulk, 1+ pitting edema of the bilateral LEE  Data Reviewed: Basic Metabolic Panel:  Recent Labs Lab 01/19/15 0033 01/20/15 2034 01/21/15 0815  NA 138 135 135  K 3.3* 4.2 3.6  CL 107 104 107  CO2 20* 22 20*  GLUCOSE 230* 179* 170*  BUN 23* 26* 27*   CREATININE 1.74* 2.02* 2.01*  CALCIUM 9.4 9.2 8.1*  MG  --   --  1.4*  PHOS  --   --  2.6   Liver Function Tests:  Recent Labs Lab 01/19/15 0033 01/20/15 2034 01/21/15 0815  AST 26 31 25   ALT 21 20 16*  ALKPHOS 75 54 47  BILITOT 0.8 2.1* 1.6*  PROT 7.9 7.0 6.1*  ALBUMIN 3.8 3.1* 2.6*    Recent Labs Lab 01/19/15 0033 01/20/15 2034  LIPASE 38 22   No results for input(s): AMMONIA in the last 168 hours. CBC:  Recent Labs Lab 01/19/15 0033 01/20/15 2034 01/21/15 0815  WBC 13.1* 24.4* 22.4*  NEUTROABS  --   --  17.5*  HGB 14.7 14.5 13.1  HCT 42.4 42.4 39.0  MCV 87.8 88.7 90.1  PLT 179 164 133*    No results found for this or any previous visit (from the past 240 hour(s)).   Studies: Ct Abdomen Pelvis Wo Contrast  01/21/2015  CLINICAL DATA:  81 year old male with abdominal pain EXAM: CT ABDOMEN AND PELVIS WITHOUT CONTRAST TECHNIQUE: Multidetector CT imaging of the abdomen and pelvis was performed following the standard protocol without IV contrast. COMPARISON:  Abdominal CT and ultrasound dated 01/19/2015 FINDINGS: Evaluation of this exam is limited in the absence of intravenous contrast. Linear bibasilar subsegmental atelectasis/ scarring. There is advanced coronary vascular calcification with CABG clips. No intra-abdominal free air or free fluid. Diffuse hepatic steatosis. The gallbladder is moderately distended. There is thickening and inflammatory changes of the gallbladder wall with stranding of the pericholecystic fat most compatible with acute cholecystitis. There has been interval progression of the inflammatory changes involving the gallbladder compared to the prior study. No calcified stone identified. Ultrasound is recommended for further evaluation of the gallbladder. The pancreas, spleen, and adrenal glands appear unremarkable. Small parapelvic renal cysts again noted. There is no hydronephrosis or nephrolithiasis. The visualized ureters and urinary bladder  appear unremarkable. The prostate gland is enlarged measuring up to 5.7 cm transverse axial dimension. There is sigmoid diverticulosis without active inflammation. There is segmental inflammatory changes and thickening of the second portion of the duodenum, likely reactive to inflammatory changes of the gallbladder. Multiple duodenal diverticula noted measuring up to 1 point 5 cm in the distal duodenum. A smaller duodenal diverticulum is noted in the second portion of the duodenum with surrounding inflammation. This inflammatory changes are likely reactive to inflammatory changes of the gallbladder and less likely represent duodenal diverticulitis. There is no evidence of bowel obstruction. Normal appendix. There is aortoiliac atherosclerotic disease. No portal venous gas identified. There is no adenopathy. The abdominal wall soft tissues appear grossly unremarkable. There is degenerative changes of the spine. There are bilateral L5 pars defects with grade II L5-S1 anterolisthesis. No acute fracture. IMPRESSION: Interval worsening of the inflammatory changes involving the gallbladder concerning for acute cholecystitis. Ultrasound is recommended for better evaluation of  the gallbladder. No calcified gallstone identified. Segmental inflammatory changes and thickening of the second portion of duodenum likely reactive to inflammatory changes of the gallbladder. A small duodenal diverticula with stranding inflammation also likely reactive to inflammatory changes of the gallbladder. Duodenal diverticulitis is less likely. Sigmoid diverticulosis. No evidence of bowel obstruction. Normal appendix. Electronically Signed   By: Anner Crete M.D.   On: 01/21/2015 02:21   Dg Chest 2 View  01/21/2015  CLINICAL DATA:  Worsening right lower quadrant abdominal pain, hypoxia and leukocytosis, acute onset. Initial encounter. EXAM: CHEST  2 VIEW COMPARISON:  Chest radiograph performed 08/28/2010 FINDINGS: The lungs are  hypoexpanded, with elevation of the right hemidiaphragm. Mild bilateral atelectasis is suggested. Vascular crowding is seen. No definite pleural effusion or pneumothorax is identified. The cardiomediastinal silhouette is mildly enlarged. The patient is status post median sternotomy. No acute osseous abnormalities are identified. IMPRESSION: 1. Lungs hypoexpanded, with elevation of the right hemidiaphragm. Mild bilateral atelectasis suggested. 2. Mild cardiomegaly. Electronically Signed   By: Garald Balding M.D.   On: 01/21/2015 00:39   Dg Chest Port 1 View  01/21/2015  CLINICAL DATA:  81 year old male with fever and shortness breath. Sepsis. EXAM: PORTABLE CHEST 1 VIEW COMPARISON:  Chest x-ray 01/20/2015. FINDINGS: Lung volumes are very low. There are some ill-defined bibasilar opacities which may reflect areas of atelectasis and/or airspace consolidation (left greater than right). Mild blunting of left costophrenic sulcus, suggestive of a small left pleural effusion. No evidence of pulmonary edema. Heart size is within normal limits. Upper mediastinal contours are normal. Atherosclerosis in the thoracic aorta. Status post median sternotomy for CABG. IMPRESSION: 1. Llow lung volumes with bibasilar (left greater than right) areas of atelectasis and/or airspace consolidation, and small left pleural effusion. Electronically Signed   By: Vinnie Langton M.D.   On: 01/21/2015 07:42    Scheduled Meds: . amLODipine  5 mg Oral Daily  . aspirin  81 mg Oral Daily  . atenolol  50 mg Oral BID  . fentaNYL      . heparin  5,000 Units Subcutaneous 3 times per day  . insulin aspart  0-9 Units Subcutaneous 6 times per day  . midazolam      . multivitamin with minerals  1 tablet Oral Daily  . omega-3 acid ethyl esters  1 g Oral Daily  . pantoprazole  40 mg Oral Daily  . piperacillin-tazobactam (ZOSYN)  IV  3.375 g Intravenous Q8H  . simvastatin  20 mg Oral QHS  . sodium chloride  3 mL Intravenous Q12H    Continuous Infusions: . sodium chloride Stopped (01/21/15 0223)  . sodium chloride 1,000 mL (01/21/15 0837)    Principal Problem:   Acute cholecystitis Active Problems:   Coronary artery disease   Hypercholesteremia   S/P CABG (coronary artery bypass graft)   Diabetes mellitus type 2 with complications ([place])    Time spent: 30 min    Markasia Carrol, [facility] Pager [phone]. If 7PM-7AM, please contact night-coverage at [email or web address], password [facility] 01/21/2015, 6:04 PM  LOS: 0 days

## 2015-01-21 NOTE — H&P (Signed)
[facility] History and Physical  Darivs Goedeke [id number] DOB: 1933/10/31 DOA: 01/20/2015  Referring physician: ED PCP: Donnajean Lopes, MD   Chief Complaint: Abdominal pain  HPI:  [name] is a 81 year old male with a past medical history significant for diabetes mellitus type 2, HLD, CAD s/p CABG; who presents with complaints of abdominal pain for the last 3 days that has been gradually worsening. Initially symptoms started out in the epigastric region and then subsequently moved to more on his right upper quadrant. Describes pain as sharp in nature. Pain is worsened by any  movements or deep breaths in. As a result of his symptoms he's felt very fatigued and has had decreased appetite. He reports associated symptoms of nausea, vomiting on at least 2 different occasions, malaise, and shortness of breath. He notes chest pain with deep inspirations. Denies any diaphoresis, diarrhea, constipation, blood in stool, or recent sick contacts. Patient notes that he was just evaluated yesterday in the ED. At that time a CT scan showed signs of possible cholecystitis, but the patient was sent home after ultrasound was negative. Upon admission to the emergency department patient was seen have a fever of 101.48F. Initial lab work showed a WBC of 24.4 and lactic acid level of 2.1  Repeat CT scan was obtained which showed significant signs of acute cholecystitis. Surgery was consulted in ED for evaluation of the patient. Patient was started on Zosyn by ED physician.    Review of Systems  Constitutional: Positive for fever and malaise/fatigue. Negative for chills.  HENT: Negative for ear discharge and hearing loss.   Eyes: Negative for photophobia and pain.  Respiratory: Positive for shortness of breath. Negative for cough.   Cardiovascular: Positive for leg swelling. Negative for palpitations and claudication.  Gastrointestinal: Positive for nausea, vomiting and abdominal pain. Negative for  diarrhea and constipation.  Genitourinary: Negative for urgency and frequency.  Musculoskeletal: Positive for joint pain. Negative for falls.  Skin: Negative for itching and rash.  Neurological: Positive for weakness. Negative for sensory change and speech change.  Endo/Heme/Allergies: Negative for polydipsia. Does not bruise/bleed easily.  Psychiatric/Behavioral: Negative for suicidal ideas and substance abuse.     Past Medical History  Diagnosis Date  . Hyperlipidemia   . Coronary artery disease 2002    CABG x5  . Diabetes mellitus   . Hypercholesteremia   . Mitral valve regurgitation   . Aortic insufficiency   . Skin cancer     tumor removed off of his back  . Acid reflux   . DVT (deep venous thrombosis) ([place])     previously on coumadin     Past Surgical History  Procedure Laterality Date  . Coronary artery bypass graft  12/21/2000    x5  . Tumor removal      off of back, skin cancer  . Skin cancer excision        Social History:  reports that he has never smoked. He does not have any smokeless tobacco history on file. He reports that he does not drink alcohol or use illicit drugs. Where does patient live--home   Can patient participate in ADLs? Yes  No Known Allergies  Family History  Problem Relation Age of Onset  . Heart attack Father   . Hypertension Mother   . Diabetes Brother   . Cancer Sister     breast  . Cancer Brother     throat & mouth     Prior to Admission medications   Medication  Sig Start Date End Date Taking? Authorizing Provider  amLODipine (NORVASC) 5 MG tablet Take 5 mg by mouth daily.     Yes Historical Provider, MD  aspirin 81 MG tablet Take 81 mg by mouth daily.   Yes Historical Provider, MD  atenolol (TENORMIN) 50 MG tablet Take 50 mg by mouth 2 (two) times daily.    Yes Historical Provider, MD  insulin NPH-regular Human (NOVOLIN 70/30) (70-30) 100 UNIT/ML injection Inject 22-36 Units into the skin See admin instructions. Take 36  units in the morning and 22 units at bedtime.   Yes Historical Provider, MD  multivitamin [facility]) per tablet Take 1 tablet by mouth daily.     Yes Historical Provider, MD  Omega-3 Fatty Acids (FISH OIL) 1200 MG CAPS Take 2 capsules by mouth daily.    Yes Historical Provider, MD  omeprazole (PRILOSEC) 20 MG capsule Take 20 mg by mouth daily.  01/06/11  Yes Historical Provider, MD  pioglitazone (ACTOS) 15 MG tablet Take 15 mg by mouth daily.   Yes Historical Provider, MD  simvastatin (ZOCOR) 20 MG tablet Take 20 mg by mouth at bedtime.     Yes Historical Provider, MD     Physical Exam: Filed Vitals:   01/21/15 0338 01/21/15 0345 01/21/15 0430 01/21/15 0445  BP:  153/64 138/72 131/77  Pulse:  73 70 70  Temp: 101.6 F (38.7 C)     TempSrc: Rectal     Resp:  31 28 22   Height:      Weight:      SpO2:  96% 91% 92%     Constitutional: Vital signs reviewed. Patient is sick appearing, but nontoxic. Able to cooperative with exam. Alert and oriented x3.  Head: Normocephalic and atraumatic  Ear: TM normal bilaterally  Mouth: no erythema or exudates, MMM  Eyes: PERRL, EOMI, conjunctivae normal, No scleral icterus.  Neck: Supple, Trachea midline normal ROM, No JVD, mass, thyromegaly, or carotid bruit present.  Cardiovascular: RRR, S1 normal, S2 normal, +SEM, pulses symmetric and intact bilaterally  Pulmonary/Chest: CTAB, no wheezes, rales, or rhonchi  Abdominal: Soft. Mild tenderness to palpation of the right upper quadrant and epigastric regions. No guarding noted. Positive bowel sounds in all 4 quadrants  Musculoskeletal: No joint deformities, erythema, or stiffness, ROM full and no nontender Ext: +2 pitting edema bilateral lower extremities. No cyanosis, pulses palpable bilaterally (DP and PT)  Hematology: no cervical, inginal, or axillary adenopathy.  Neurological: A&O x3, Strenght is normal and symmetric bilaterally, cranial nerve II-XII are grossly intact, no focal motor deficit,  sensory intact to light touch bilaterally.  Skin: Warm, dry and intact. No rash, cyanosis, or clubbing.  Psychiatric: Normal mood and affect. speech and behavior is normal. Judgment and thought content normal. Cognition and memory are normal.      Data Review   Micro Results No results found for this or any previous visit (from the past 240 hour(s)).  Radiology Reports Ct Abdomen Pelvis Wo Contrast  01/21/2015  CLINICAL DATA:  81 year old male with abdominal pain EXAM: CT ABDOMEN AND PELVIS WITHOUT CONTRAST TECHNIQUE: Multidetector CT imaging of the abdomen and pelvis was performed following the standard protocol without IV contrast. COMPARISON:  Abdominal CT and ultrasound dated 01/19/2015 FINDINGS: Evaluation of this exam is limited in the absence of intravenous contrast. Linear bibasilar subsegmental atelectasis/ scarring. There is advanced coronary vascular calcification with CABG clips. No intra-abdominal free air or free fluid. Diffuse hepatic steatosis. The gallbladder is moderately distended. There is thickening and  inflammatory changes of the gallbladder wall with stranding of the pericholecystic fat most compatible with acute cholecystitis. There has been interval progression of the inflammatory changes involving the gallbladder compared to the prior study. No calcified stone identified. Ultrasound is recommended for further evaluation of the gallbladder. The pancreas, spleen, and adrenal glands appear unremarkable. Small parapelvic renal cysts again noted. There is no hydronephrosis or nephrolithiasis. The visualized ureters and urinary bladder appear unremarkable. The prostate gland is enlarged measuring up to 5.7 cm transverse axial dimension. There is sigmoid diverticulosis without active inflammation. There is segmental inflammatory changes and thickening of the second portion of the duodenum, likely reactive to inflammatory changes of the gallbladder. Multiple duodenal diverticula  noted measuring up to 1 point 5 cm in the distal duodenum. A smaller duodenal diverticulum is noted in the second portion of the duodenum with surrounding inflammation. This inflammatory changes are likely reactive to inflammatory changes of the gallbladder and less likely represent duodenal diverticulitis. There is no evidence of bowel obstruction. Normal appendix. There is aortoiliac atherosclerotic disease. No portal venous gas identified. There is no adenopathy. The abdominal wall soft tissues appear grossly unremarkable. There is degenerative changes of the spine. There are bilateral L5 pars defects with grade II L5-S1 anterolisthesis. No acute fracture. IMPRESSION: Interval worsening of the inflammatory changes involving the gallbladder concerning for acute cholecystitis. Ultrasound is recommended for better evaluation of the gallbladder. No calcified gallstone identified. Segmental inflammatory changes and thickening of the second portion of duodenum likely reactive to inflammatory changes of the gallbladder. A small duodenal diverticula with stranding inflammation also likely reactive to inflammatory changes of the gallbladder. Duodenal diverticulitis is less likely. Sigmoid diverticulosis. No evidence of bowel obstruction. Normal appendix. Electronically Signed   By: Anner Crete M.D.   On: 01/21/2015 02:21   Ct Abdomen Pelvis Wo Contrast  01/19/2015  CLINICAL DATA:  Mid abdominal pain with nausea and vomiting since last night. EXAM: CT ABDOMEN AND PELVIS WITHOUT CONTRAST TECHNIQUE: Multidetector CT imaging of the abdomen and pelvis was performed following the standard protocol without IV contrast. COMPARISON:  CT abdomen and pelvis dated 08/28/2010. FINDINGS: Gallbladder is moderately distended. Gallbladder walls are at least mildly thickened and indistinct with subtle pericholecystic edema suggesting acute cholecystitis. Liver, spleen, pancreas, and adrenal glands are unremarkable. Parapelvic renal  cysts again noted bilaterally. Kidneys otherwise unremarkable without stone or hydronephrosis. No ureteral or bladder calculi identified. Bladder is moderately distended but otherwise unremarkable. Prostate gland is moderately enlarged causing some mass effect on the bladder base. Bowel is normal in caliber. Scattered diverticulosis noted within the sigmoid colon without evidence of acute diverticulitis. Appendix is normal. No free fluid or abscess collection. No free intraperitoneal air. No enlarged lymph nodes seen. Atherosclerotic changes are seen along the walls of the normal-caliber abdominal aorta. There is small hiatal hernia. Mild scarring/atelectasis at the adjacent lung bases. Small benign granulomas also noted at the right lung base. Fairly extensive coronary artery calcifications noted at the heart base. Median sternotomy wires in place, presumably related to a previous CABG. Scattered degenerative changes are seen throughout the thoracolumbar spine, moderate in degree. Chronic pars interarticularis defects noted at the L5-S1 level with associated grade 2 anterolisthesis of L5 on S1. No acute- appearing osseous abnormality. IMPRESSION: 1. Suspect acute cholecystitis. Recommend gallbladder ultrasound and/or nuclear medicine HIDA scan for confirmation. 2. Prostate enlargement, similar to previous CT, with associated mass effect on the bladder base. 3. Small hiatal hernia. 4. Colonic diverticulosis without evidence of acute  diverticulitis. 5. Bilateral chronic pars interarticularis defects at L5-S1 with resultant grade 2 spondylolisthesis of L5 on S1. This was also described on the previous CT report. These results were called by telephone at the time of interpretation on 01/19/2015 at 7:20 am to Dr. [name] , who verbally acknowledged these results. Electronically Signed   By: Franki Cabot M.D.   On: 01/19/2015 07:22   Dg Chest 2 View  01/21/2015  CLINICAL DATA:  Worsening right lower quadrant  abdominal pain, hypoxia and leukocytosis, acute onset. Initial encounter. EXAM: CHEST  2 VIEW COMPARISON:  Chest radiograph performed 08/28/2010 FINDINGS: The lungs are hypoexpanded, with elevation of the right hemidiaphragm. Mild bilateral atelectasis is suggested. Vascular crowding is seen. No definite pleural effusion or pneumothorax is identified. The cardiomediastinal silhouette is mildly enlarged. The patient is status post median sternotomy. No acute osseous abnormalities are identified. IMPRESSION: 1. Lungs hypoexpanded, with elevation of the right hemidiaphragm. Mild bilateral atelectasis suggested. 2. Mild cardiomegaly. Electronically Signed   By: Garald Balding M.D.   On: 01/21/2015 00:39   US Abdomen Limited Ruq  01/19/2015  CLINICAL DATA:  Right upper quadrant abdominal pain. Nausea and vomiting. EXAM: US ABDOMEN LIMITED - RIGHT UPPER QUADRANT COMPARISON:  CT 01/19/2015.  Ultrasound 12/03/2014. FINDINGS: Gallbladder: No gallstones or wall thickening visualized. No sonographic Murphy sign noted. Common bile duct: Diameter: 3.3 mm Liver: No focal lesion identified. Within normal limits in parenchymal echogenicity. Evaluation is mildly limited by bowel gas. IMPRESSION: Negative right upper quadrant ultrasound. No evidence of cholecystitis or biliary dilatation. Electronically Signed   By: Richardean Sale M.D.   On: 01/19/2015 08:16     CBC  Recent Labs Lab 01/19/15 0033 01/20/15 2034  WBC 13.1* 24.4*  HGB 14.7 14.5  HCT 42.4 42.4  PLT 179 164  MCV 87.8 88.7  MCH 30.4 30.3  MCHC 34.7 34.2  RDW 13.5 13.4    Chemistries   Recent Labs Lab 01/19/15 0033 01/20/15 2034  NA 138 135  K 3.3* 4.2  CL 107 104  CO2 20* 22  GLUCOSE 230* 179*  BUN 23* 26*  CREATININE 1.74* 2.02*  CALCIUM 9.4 9.2  AST 26 31  ALT 21 20  ALKPHOS 75 54  BILITOT 0.8 2.1*   ------------------------------------------------------------------------------------------------------------------ estimated  creatinine clearance is 32.5 mL/min (by C-G formula based on Cr of 2.02). ------------------------------------------------------------------------------------------------------------------ No results for input(s): HGBA1C in the last 72 hours. ------------------------------------------------------------------------------------------------------------------ No results for input(s): CHOL, HDL, LDLCALC, TRIG, CHOLHDL, LDLDIRECT in the last 72 hours. ------------------------------------------------------------------------------------------------------------------ No results for input(s): TSH, T4TOTAL, T3FREE, THYROIDAB in the last 72 hours.  Invalid input(s): FREET3 ------------------------------------------------------------------------------------------------------------------ No results for input(s): VITAMINB12, FOLATE, FERRITIN, TIBC, IRON, RETICCTPCT in the last 72 hours.  Coagulation profile No results for input(s): INR, PROTIME in the last 168 hours.  No results for input(s): DDIMER in the last 72 hours.  Cardiac Enzymes  Recent Labs Lab 01/19/15 0033 01/19/15 0350 01/20/15 2333  TROPONINI <0.03 <0.03 0.09*   ------------------------------------------------------------------------------------------------------------------ Invalid input(s): POCBNP   CBG:  Recent Labs Lab 01/20/15 1958  GLUCAP 187*       EKG: Independently reviewed. Sinus rhythm with a right bundle branch block.   Assessment/Plan   Sepsis secondary to Acute cholecystitis: Patient with complaints of right upper quadrant abdominal pain. Initially found to have a Fever of 101.4F, lactic acid level of 2.1, WBC of 24.4, and respiratory rate of 30.  Repeat CT scan was obtained which showed significant signs of acute cholecystitis. Surgery was consulted  in ED for evaluation of the patient. Patient was started on Zosyn by ED physician. Patient shallow breathing likely response of acute pain -Admit to telemetry  bed -Surgery consulted follow-up recommendations -NPO for possible procedure  -continue Zosyn -Follow up blood cultures -Repeat CBC in a.m. -Morphine prn pain -Albuterol q6hrs and  q2hrs prn shortness of breath  Elevated cardiac enzymes: Initial troponin 0.09. No significant EKG changes seen. Suspect secondary to demand ischemia with patient's increased respiratory rate. -Cardiac enzymes 3 every 6 hours -Repeat EKG in am -Follow-up telemetry overnight  Suspected acute kidney injury: Patient with elevation in creatinine to 2.02 on admission. Previous baseline creatinine appears to be around 1.5 approximately one year ago. Patient endorsed decreased intake. -Gentle IV fluids considering patient's previous heart history and signs of cardiomegaly on CXR -f/u repeat bmp  Nausea &vomiting:  Improved -Zofran prn   Coronary artery disease S/P CABG: Patient was scheduled to have a myocardial perfusion imaging study on 01/27/2015 by Dr. Acie Fredrickson. On physical exam patient with 2+ possible pitting edema. Chest x-ray showing cardiomegaly.  - Continue aspirin -Check and BMP  Diabetes mellitus type 2 with complications ([place]): Last hemoglobin A1c unknown at this time. -Held Actos and 70/30 regimen of insulin -CBG q4hrs with sensitive a sliding scale of insulin  Essential Hypertension: -Continue amlodipine and atenolol  Hypercholesteremia -Continue simvastatin  GERD -Home medication of Prilosec changed to Protonix per pharmacy     Code Status:   full Family Communication: bedside Disposition Plan: admit   Total time spent 55 minutes.Greater than 50% of this time was spent in counseling, explanation of diagnosis, planning of further management, and coordination of care  [facility] Pager [phone]  If 7PM-7AM, please contact night-coverage [email or web address] Password [email or web address] 01/21/2015, 4:56 AM

## 2015-01-21 NOTE — Progress Notes (Signed)
Utilization review completed. Baden Betsch, RN, BSN. 

## 2015-01-21 NOTE — Progress Notes (Signed)
Patient needs a HIDA scan prior to percutaneous drain.  Will order.  Kathryne Eriksson. Dahlia Bailiff, MD, [place] [phone] [facility]

## 2015-01-21 NOTE — Progress Notes (Signed)
Abundant WBC present, gram neg rods.  Result called in from Lab by Levada Dy.  Report called to Dr. Sheran Fava.  Will continue to monitor.  Karie Kirks, Therapist, sports.

## 2015-01-21 NOTE — Progress Notes (Signed)
ANTIBIOTIC CONSULT NOTE - INITIAL  Pharmacy Consult for Zosyn Indication: intra-abdominal infection   Labs:  Recent Labs  01/19/15 0033 01/20/15 2034  WBC 13.1* 24.4*  HGB 14.7 14.5  PLT 179 164  CREATININE 1.74* 2.02*   Estimated Creatinine Clearance: 32.5 mL/min (by C-G formula based on Cr of 2.02).   Assessment/Plan: 81yo male c/o RUQ abdominal pain, denies N/V/D, CT concerning for acute cholecystitis, to begin IV ABX.  Will begin Zosyn 3.375g IV Q8H and monitor CBC and Cx.  Wynona Neat, PharmD, BCPS  01/21/2015,5:41 AM

## 2015-01-21 NOTE — Procedures (Signed)
Post-Procedure Note  Pre-operative Diagnosis: Acute cholecystitis       Post-operative Diagnosis: Acute cholecystitis   Indications: Acute cholecystitis.  Elevated WBC and pain.  Procedure Details:   [place] and fluoroscopic guided cholecystostomy tube placement.    Findings: Distended gallbladder prior to catheter placement.  Removed 80 ml of pink purulent fluid.   Gallbladder decompressed after drain placement.   Complications: none     Condition: Stable  Plan: Follow drainage and send fluid for culture.

## 2015-01-21 NOTE — Progress Notes (Signed)
Instructed by Pamala Hurry in nuclear med instructed not to give pt any narcotic or po meds until after his hida scan procedure.  Sunya Humbarger bsuck, Therapist, sports.

## 2015-01-21 NOTE — ED Notes (Signed)
MD at bedside. 

## 2015-01-22 ENCOUNTER — Inpatient Hospital Stay (HOSPITAL_COMMUNITY): Payer: PPO

## 2015-01-22 DIAGNOSIS — R778 Other specified abnormalities of plasma proteins: Secondary | ICD-10-CM | POA: Diagnosis present

## 2015-01-22 DIAGNOSIS — A419 Sepsis, unspecified organism: Secondary | ICD-10-CM

## 2015-01-22 DIAGNOSIS — R0902 Hypoxemia: Secondary | ICD-10-CM

## 2015-01-22 DIAGNOSIS — I2510 Atherosclerotic heart disease of native coronary artery without angina pectoris: Secondary | ICD-10-CM

## 2015-01-22 DIAGNOSIS — R7989 Other specified abnormal findings of blood chemistry: Secondary | ICD-10-CM

## 2015-01-22 LAB — COMPREHENSIVE METABOLIC PANEL
ALBUMIN: 2.3 g/dL — AB (ref 3.5–5.0)
ALK PHOS: 40 U/L (ref 38–126)
ALT: 18 U/L (ref 17–63)
AST: 25 U/L (ref 15–41)
Anion gap: 8 (ref 5–15)
BUN: 30 mg/dL — AB (ref 6–20)
CALCIUM: 8.1 mg/dL — AB (ref 8.9–10.3)
CO2: 20 mmol/L — AB (ref 22–32)
CREATININE: 2.06 mg/dL — AB (ref 0.61–1.24)
Chloride: 109 mmol/L (ref 101–111)
GFR calc Af Amer: 34 mL/min — ABNORMAL LOW (ref >60)
GFR calc non Af Amer: 29 mL/min — ABNORMAL LOW (ref >60)
GLUCOSE: 182 mg/dL — AB (ref 65–99)
Potassium: 3.5 mmol/L (ref 3.5–5.1)
SODIUM: 137 mmol/L (ref 135–145)
Total Bilirubin: 1.5 mg/dL — ABNORMAL HIGH (ref 0.3–1.2)
Total Protein: 5.8 g/dL — ABNORMAL LOW (ref 6.5–8.1)

## 2015-01-22 LAB — GLUCOSE, CAPILLARY
GLUCOSE-CAPILLARY: 171 mg/dL — AB (ref 65–99)
GLUCOSE-CAPILLARY: 154 mg/dL — AB (ref 65–99)
Glucose-Capillary: 158 mg/dL — ABNORMAL HIGH (ref 65–99)
Glucose-Capillary: 164 mg/dL — ABNORMAL HIGH (ref 65–99)
Glucose-Capillary: 175 mg/dL — ABNORMAL HIGH (ref 65–99)

## 2015-01-22 LAB — CBC
HCT: 37.6 % — ABNORMAL LOW (ref 39.0–52.0)
Hemoglobin: 12.6 g/dL — ABNORMAL LOW (ref 13.0–17.0)
MCH: 29.8 pg (ref 26.0–34.0)
MCHC: 33.5 g/dL (ref 30.0–36.0)
MCV: 88.9 fL (ref 78.0–100.0)
PLATELETS: 141 10*3/uL — AB (ref 150–400)
RBC: 4.23 MIL/uL (ref 4.22–5.81)
RDW: 13.5 % (ref 11.5–15.5)
WBC: 13 10*3/uL — ABNORMAL HIGH (ref 4.0–10.5)

## 2015-01-22 LAB — BRAIN NATRIURETIC PEPTIDE: B NATRIURETIC PEPTIDE 5: 441.5 pg/mL — AB (ref 0.0–100.0)

## 2015-01-22 MED ORDER — MORPHINE SULFATE (PF) 2 MG/ML IV SOLN
1.0000 mg | INTRAVENOUS | Status: DC | PRN
  Administered 2015-01-22 – 2015-01-23 (×3): 2 mg via INTRAVENOUS
  Filled 2015-01-22 (×3): qty 1

## 2015-01-22 MED ORDER — HYDROCODONE-ACETAMINOPHEN 5-325 MG PO TABS
1.0000 | ORAL_TABLET | Freq: Four times a day (QID) | ORAL | Status: DC | PRN
  Administered 2015-01-23: 2 via ORAL
  Filled 2015-01-22: qty 2

## 2015-01-22 NOTE — Progress Notes (Signed)
[facility] PROGRESS NOTE  Nathan [name] [id number] DOB: 1933-11-22 DOA: 01/20/2015 PCP: Donnajean Lopes, MD  Brief Summary  Nathan [name] is a 81 year old male with a past medical history significant for diabetes mellitus type 2, HLD, CAD s/p CABG; who presented with progressively worsening abdominal pain for 3 days.  In the ER, fever of 101.52F. Initial lab work showed a WBC of 24.4 and lactic acid level of 2.1 CT scan demonstrated acute cholecystitis.  He was started on zosyn.  Surgery recommended conservative management given the patient's CAD/comorbidities.    Assessment/Plan  Sepsis secondary to Acute cholecystitis (fever, leukocytosis, tachypnea), Fever trending down on antibiotics.  Leukocytosis stable.  Currently on clear liquid diet -  Continue zosyn -  Cholecystostomy drain placed on 12/6 -  Blood cultures pending -  Biliary culture growing GNR  Hypoxia: Patient reports shortness of breath and appears to And per nurse, becomes hypoxic off oxygen. Likely from splinting but will repeat chest x-ray. Yesterday's chest x-ray was next per [name] film but didn't show anything definite. Will check BNP. Continue incentive spirometry and oxygen. Likely needs better pain control. We'll change morphine to 1-2 mg every 2 hours as needed and add Vicodin 1-2 tabs by mouth every 6 hours when necessary.  Coronary artery disease S/P CABG, was previously asymptomatic with MI: Patient was scheduled to have a myocardial perfusion imaging study on 01/27/2015 by Dr. Acie Fredrickson. Troponin peaked at 0.3. No change in EKG or anginal type pain. Discussed with Dr. Acie Fredrickson. He recommends inpatient Myoview. I will order an call cardiology if abnormal. Elevated troponins hopefully just related to sepsis.   Elevated cardiac enzymes: Initial troponin 0.09 and rising somewhat -  Continue ASA, statin, BB See above  Acute kidney injury versus progression of chronic kidney disease, creatinine 1.5 approximately one  year ago -  Creatinine stable around 2. Continue to monitor.  Diabetes mellitus type 2 with complications ([place]): Last hemoglobin A1c unknown at this time. - Held Actos and 70/30 regimen of insulin - CBG well controlled   Essential Hypertension, stable  Hypercholesteremia -Continue simvastatin  GERD Continue PPI  Diet:  CLD post-procedure and advance to diabetic, healthy heart when tolerating clears Access:  PIV IVF:  Yes until tolerating diet Proph:  Heparin   Code Status: full Family Communication: patient is lucid Disposition Plan:  Pending clinical improvement after cholecystomy drain placed.   Consultants:  IR  General surgery  Procedures:  CT abd/pelvis  Perc cholecystostomy drain on 12/6   Antibiotics:  Zosyn 12/5 >    HPI/Subjective:  Complaining of abdominal pain uncontrolled by morphine. Admits to shortness of breath, pain with breathing. Denies chest pain. Has used incentive spirometry wants.  Objective: Filed Vitals:   01/22/15 0818 01/22/15 1033 01/22/15 1145 01/22/15 1246  BP: 131/68 119/68    Pulse: 69 72    Temp: 98.3 F (36.8 C)     TempSrc: Oral     Resp: 18 18    Height:      Weight:      SpO2: 90%  88% 90%    Intake/Output Summary (Last 24 hours) at 01/22/15 1342 Last data filed at 01/22/15 1342  Gross per 24 hour  Intake 1409.17 ml  Output    820 ml  Net 589.17 ml   Filed Weights   01/20/15 1954 01/21/15 0713 01/22/15 0413  Weight: 87.998 kg (194 lb) 86.9 kg (191 lb 9.3 oz) 85.957 kg (189 lb 8 oz)   Body mass index is 25.7  kg/(m^2).  Exam:   General:  Alert. Uncomfortable appearing. Tachypneic  Cardiovascular:  RRR, nl S1, S2 no mrg, 2+ pulses, warm extremities  Respiratory:  Splinting. Rales at bases. No rhonchi or wheeze.  Abdomen:   Nondistended. Bowel sounds present. Right upper quadrant tenderness  Extremities: Trace pitting edema  Data Reviewed: Basic Metabolic Panel:  Recent Labs Lab 01/19/15 0033  01/20/15 2034 01/21/15 0815 01/22/15 0449  NA 138 135 135 137  K 3.3* 4.2 3.6 3.5  CL 107 104 107 109  CO2 20* 22 20* 20*  GLUCOSE 230* 179* 170* 182*  BUN 23* 26* 27* 30*  CREATININE 1.74* 2.02* 2.01* 2.06*  CALCIUM 9.4 9.2 8.1* 8.1*  MG  --   --  1.4*  --   PHOS  --   --  2.6  --    Liver Function Tests:  Recent Labs Lab 01/19/15 0033 01/20/15 2034 01/21/15 0815 01/22/15 0449  AST 26 31 25 25   ALT 21 20 16* 18  ALKPHOS 75 54 47 40  BILITOT 0.8 2.1* 1.6* 1.5*  PROT 7.9 7.0 6.1* 5.8*  ALBUMIN 3.8 3.1* 2.6* 2.3*    Recent Labs Lab 01/19/15 0033 01/20/15 2034  LIPASE 38 22   No results for input(s): AMMONIA in the last 168 hours. CBC:  Recent Labs Lab 01/19/15 0033 01/20/15 2034 01/21/15 0815 01/22/15 0449  WBC 13.1* 24.4* 22.4* 13.0*  NEUTROABS  --   --  17.5*  --   HGB 14.7 14.5 13.1 12.6*  HCT 42.4 42.4 39.0 37.6*  MCV 87.8 88.7 90.1 88.9  PLT 179 164 133* 141*    Recent Results (from the past 240 hour(s))  Blood culture (routine x 2)     Status: None (Preliminary result)   Collection Time: 01/20/15 11:25 PM  Result Value Ref Range Status   Specimen Description BLOOD RIGHT ARM  Final   Special Requests BOTTLES DRAWN AEROBIC AND ANAEROBIC 5ML  Final   Culture NO GROWTH 1 DAY  Final   Report Status PENDING  Incomplete  Blood culture (routine x 2)     Status: None (Preliminary result)   Collection Time: 01/20/15 11:30 PM  Result Value Ref Range Status   Specimen Description BLOOD LEFT HAND  Final   Special Requests BOTTLES DRAWN AEROBIC AND ANAEROBIC 5ML  Final   Culture NO GROWTH 1 DAY  Final   Report Status PENDING  Incomplete  Culture, body fluid-bottle     Status: None (Preliminary result)   Collection Time: 01/21/15  4:00 PM  Result Value Ref Range Status   Specimen Description FLUID GALL BLADDER  Final   Special Requests BOTTLES DRAWN AEROBIC AND ANAEROBIC 10CC  Final   Gram Stain   Final    GRAM NEGATIVE RODS IN BOTH AEROBIC AND  ANAEROBIC BOTTLES CRITICAL RESULT CALLED TO, READ BACK BY AND VERIFIED WITH: Ophelia Charter RN [id number] 01/21/15 A BROWNING    Culture GRAM NEGATIVE RODS  Final   Report Status PENDING  Incomplete  Gram stain     Status: None   Collection Time: 01/21/15  4:00 PM  Result Value Ref Range Status   Specimen Description FLUID GALL BLADDER  Final   Special Requests BOTTLES DRAWN AEROBIC AND ANAEROBIC 10CC  Final   Gram Stain   Final    ABUNDANT WBC PRESENT, PREDOMINANTLY PMN GRAM NEGATIVE RODS Gram Stain Report Called to,Read Back By and Verified With: N BUCK RN [id number] 01/21/15 A BROWNING    Report Status 01/21/2015  FINAL  Final     Studies: Ct Abdomen Pelvis Wo Contrast  01/21/2015  CLINICAL DATA:  81 year old male with abdominal pain EXAM: CT ABDOMEN AND PELVIS WITHOUT CONTRAST TECHNIQUE: Multidetector CT imaging of the abdomen and pelvis was performed following the standard protocol without IV contrast. COMPARISON:  Abdominal CT and ultrasound dated 01/19/2015 FINDINGS: Evaluation of this exam is limited in the absence of intravenous contrast. Linear bibasilar subsegmental atelectasis/ scarring. There is advanced coronary vascular calcification with CABG clips. No intra-abdominal free air or free fluid. Diffuse hepatic steatosis. The gallbladder is moderately distended. There is thickening and inflammatory changes of the gallbladder wall with stranding of the pericholecystic fat most compatible with acute cholecystitis. There has been interval progression of the inflammatory changes involving the gallbladder compared to the prior study. No calcified stone identified. Ultrasound is recommended for further evaluation of the gallbladder. The pancreas, spleen, and adrenal glands appear unremarkable. Small parapelvic renal cysts again noted. There is no hydronephrosis or nephrolithiasis. The visualized ureters and urinary bladder appear unremarkable. The prostate gland is enlarged measuring up to 5.7 cm transverse  axial dimension. There is sigmoid diverticulosis without active inflammation. There is segmental inflammatory changes and thickening of the second portion of the duodenum, likely reactive to inflammatory changes of the gallbladder. Multiple duodenal diverticula noted measuring up to 1 point 5 cm in the distal duodenum. A smaller duodenal diverticulum is noted in the second portion of the duodenum with surrounding inflammation. This inflammatory changes are likely reactive to inflammatory changes of the gallbladder and less likely represent duodenal diverticulitis. There is no evidence of bowel obstruction. Normal appendix. There is aortoiliac atherosclerotic disease. No portal venous gas identified. There is no adenopathy. The abdominal wall soft tissues appear grossly unremarkable. There is degenerative changes of the spine. There are bilateral L5 pars defects with grade II L5-S1 anterolisthesis. No acute fracture. IMPRESSION: Interval worsening of the inflammatory changes involving the gallbladder concerning for acute cholecystitis. Ultrasound is recommended for better evaluation of the gallbladder. No calcified gallstone identified. Segmental inflammatory changes and thickening of the second portion of duodenum likely reactive to inflammatory changes of the gallbladder. A small duodenal diverticula with stranding inflammation also likely reactive to inflammatory changes of the gallbladder. Duodenal diverticulitis is less likely. Sigmoid diverticulosis. No evidence of bowel obstruction. Normal appendix. Electronically Signed   By: Anner Crete M.D.   On: 01/21/2015 02:21   Dg Chest 2 View  01/21/2015  CLINICAL DATA:  Worsening right lower quadrant abdominal pain, hypoxia and leukocytosis, acute onset. Initial encounter. EXAM: CHEST  2 VIEW COMPARISON:  Chest radiograph performed 08/28/2010 FINDINGS: The lungs are hypoexpanded, with elevation of the right hemidiaphragm. Mild bilateral atelectasis is  suggested. Vascular crowding is seen. No definite pleural effusion or pneumothorax is identified. The cardiomediastinal silhouette is mildly enlarged. The patient is status post median sternotomy. No acute osseous abnormalities are identified. IMPRESSION: 1. Lungs hypoexpanded, with elevation of the right hemidiaphragm. Mild bilateral atelectasis suggested. 2. Mild cardiomegaly. Electronically Signed   By: Garald Balding M.D.   On: 01/21/2015 00:39   Ir Perc Cholecystostomy  01/21/2015  CLINICAL DATA:  81 year old with acute cholecystitis. Right upper quadrant pain and elevated white blood cell count. EXAM: PERCUTANEOUS CHOLECYSTOSTOMY TUBE PLACEMENT WITH ULTRASOUND AND FLUOROSCOPIC GUIDANCE Physician: Stephan Minister. Henn, MD FLUOROSCOPY TIME:  42 seconds, 4 mGy MEDICATIONS: 1 mg versed, 50 mcg fentanyl. A radiology nurse monitored the patient for moderate sedation. ANESTHESIA/SEDATION: Moderate sedation time: 25 minutes PROCEDURE: Informed consent was  obtained for percutaneous cholecystostomy tube placement. The patient was placed supine on the interventional table. Ultrasound was used to localize the gallbladder in the right upper abdomen. The right upper abdomen was prepped and draped in a sterile fashion. Maximal barrier sterile technique was utilized including caps, mask, sterile gowns, sterile gloves, sterile drape, hand hygiene and skin antiseptic. Skin was anesthetized with 1% lidocaine. Using ultrasound guidance, a 21 gauge needle was directed into the distended gallbladder via a transhepatic approach. Wire was advanced into the gallbladder with ultrasound and fluoroscopic guidance. Needle was exchanged for an Accustick dilator set. The tract was dilated to accommodate a 10.2 [place] multipurpose drain. Approximately 80 mL of pink colored purulent fluid was aspirated from the gallbladder. Catheter was sutured to skin and attached to gravity bag. FINDINGS: Distended gallbladder. 80 mL of purulent fluid was  removed from the gallbladder. Gallbladder was decompressed at the end of the procedure. Estimated blood loss: Minimal COMPLICATIONS: None IMPRESSION: Successful percutaneous cholecystostomy tube placement. Electronically Signed   By: Markus Daft M.D.   On: 01/21/2015 18:12   Dg Chest Port 1 View  01/21/2015  CLINICAL DATA:  81 year old male with fever and shortness breath. Sepsis. EXAM: PORTABLE CHEST 1 VIEW COMPARISON:  Chest x-ray 01/20/2015. FINDINGS: Lung volumes are very low. There are some ill-defined bibasilar opacities which may reflect areas of atelectasis and/or airspace consolidation (left greater than right). Mild blunting of left costophrenic sulcus, suggestive of a small left pleural effusion. No evidence of pulmonary edema. Heart size is within normal limits. Upper mediastinal contours are normal. Atherosclerosis in the thoracic aorta. Status post median sternotomy for CABG. IMPRESSION: 1. Llow lung volumes with bibasilar (left greater than right) areas of atelectasis and/or airspace consolidation, and small left pleural effusion. Electronically Signed   By: Vinnie Langton M.D.   On: 01/21/2015 07:42    Scheduled Meds: . amLODipine  5 mg Oral Daily  . aspirin  81 mg Oral Daily  . atenolol  50 mg Oral BID  . heparin  5,000 Units Subcutaneous 3 times per day  . insulin aspart  0-9 Units Subcutaneous TID WC  . multivitamin with minerals  1 tablet Oral Daily  . omega-3 acid ethyl esters  1 g Oral Daily  . pantoprazole  40 mg Oral Daily  . piperacillin-tazobactam (ZOSYN)  IV  3.375 g Intravenous Q8H  . simvastatin  20 mg Oral QHS  . sodium chloride  3 mL Intravenous Q12H   Continuous Infusions:    Principal Problem:   Acute cholecystitis Active Problems:   Coronary artery disease   Hypercholesteremia   S/P CABG (coronary artery bypass graft)   Diabetes mellitus type 2 with complications ([place])   Sepsis ([place])    Time spent: 35 min    [facility] [email or web address], password [facility] 01/22/2015, 1:42 PM  LOS: 1 day

## 2015-01-22 NOTE — Progress Notes (Signed)
  Echocardiogram 2D Echocardiogram has been performed.  Diamond Nickel 01/22/2015, 3:51 PM

## 2015-01-22 NOTE — Progress Notes (Signed)
PT Cancellation Note  Patient Details Name: Nathan [name] MRN: [id number] DOB: 06/15/1933   Cancelled Treatment:    Reason Eval/Treat Not Completed: Patient not medically ready. Dr. Conley Canal asked to hold at this time due to patient with SOB and increased pain. She is ordering a chest X-ray. PT to return as able.   Kingsley Callander 01/22/2015, 1:42 PM   Kittie Plater, PT, DPT Pager #: [phone] Office #: [phone]

## 2015-01-22 NOTE — Progress Notes (Signed)
Patient ID: Nathan [name], male   DOB: 1933/11/07, 81 y.o.   MRN: [id number]    Subjective: Pt states he can't tell if he feels better today or not.  Still has some right sided abdominal pain.  Passing some flatus.  Had some hot tea last night and did well with that.  Objective: Vital signs in last 24 hours: Temp:  [98.2 F (36.8 C)-101.9 F (38.8 C)] 98.2 F (36.8 C) (12/07 0413) Pulse Rate:  [67-86] 67 (12/07 0413) Resp:  [17-32] 20 (12/07 0413) BP: (113-178)/(53-79) 113/59 mmHg (12/07 0413) SpO2:  [87 %-94 %] 90 % (12/07 0413) Weight:  [85.957 kg (189 lb 8 oz)] 85.957 kg (189 lb 8 oz) (12/07 0413) Last BM Date: 01/21/15  Intake/Output from previous day: 12/06 0701 - 12/07 0700 In: 979.2 [P.O.:360; I.V.:469.2; IV Piggyback:150] Out: 592 [Urine:501; Drains:90; Stool:1] Intake/Output this shift:    PE: Abd: soft, tender on right side of abdomen, but no guarding, few BS, seems a bit distended, perc drain with minimal serosang output this morning.  Lab Results:   Recent Labs  01/21/15 0815 01/22/15 0449  WBC 22.4* 13.0*  HGB 13.1 12.6*  HCT 39.0 37.6*  PLT 133* 141*   BMET  Recent Labs  01/21/15 0815 01/22/15 0449  NA 135 137  K 3.6 3.5  CL 107 109  CO2 20* 20*  GLUCOSE 170* 182*  BUN 27* 30*  CREATININE 2.01* 2.06*  CALCIUM 8.1* 8.1*   PT/INR  Recent Labs  01/21/15 0815  LABPROT 18.9*  INR 1.58*   CMP     Component Value Date/Time   NA 137 01/22/2015 0449   K 3.5 01/22/2015 0449   CL 109 01/22/2015 0449   CO2 20* 01/22/2015 0449   GLUCOSE 182* 01/22/2015 0449   BUN 30* 01/22/2015 0449   CREATININE 2.06* 01/22/2015 0449   CALCIUM 8.1* 01/22/2015 0449   PROT 5.8* 01/22/2015 0449   ALBUMIN 2.3* 01/22/2015 0449   AST 25 01/22/2015 0449   ALT 18 01/22/2015 0449   ALKPHOS 40 01/22/2015 0449   BILITOT 1.5* 01/22/2015 0449   GFRNONAA 29* 01/22/2015 0449   GFRAA 34* 01/22/2015 0449   Lipase     Component Value Date/Time   LIPASE 22 01/20/2015  2034       Studies/Results: Ct Abdomen Pelvis Wo Contrast  01/21/2015  CLINICAL DATA:  81 year old male with abdominal pain EXAM: CT ABDOMEN AND PELVIS WITHOUT CONTRAST TECHNIQUE: Multidetector CT imaging of the abdomen and pelvis was performed following the standard protocol without IV contrast. COMPARISON:  Abdominal CT and ultrasound dated 01/19/2015 FINDINGS: Evaluation of this exam is limited in the absence of intravenous contrast. Linear bibasilar subsegmental atelectasis/ scarring. There is advanced coronary vascular calcification with CABG clips. No intra-abdominal free air or free fluid. Diffuse hepatic steatosis. The gallbladder is moderately distended. There is thickening and inflammatory changes of the gallbladder wall with stranding of the pericholecystic fat most compatible with acute cholecystitis. There has been interval progression of the inflammatory changes involving the gallbladder compared to the prior study. No calcified stone identified. Ultrasound is recommended for further evaluation of the gallbladder. The pancreas, spleen, and adrenal glands appear unremarkable. Small parapelvic renal cysts again noted. There is no hydronephrosis or nephrolithiasis. The visualized ureters and urinary bladder appear unremarkable. The prostate gland is enlarged measuring up to 5.7 cm transverse axial dimension. There is sigmoid diverticulosis without active inflammation. There is segmental inflammatory changes and thickening of the second portion of the duodenum, likely  reactive to inflammatory changes of the gallbladder. Multiple duodenal diverticula noted measuring up to 1 point 5 cm in the distal duodenum. A smaller duodenal diverticulum is noted in the second portion of the duodenum with surrounding inflammation. This inflammatory changes are likely reactive to inflammatory changes of the gallbladder and less likely represent duodenal diverticulitis. There is no evidence of bowel obstruction.  Normal appendix. There is aortoiliac atherosclerotic disease. No portal venous gas identified. There is no adenopathy. The abdominal wall soft tissues appear grossly unremarkable. There is degenerative changes of the spine. There are bilateral L5 pars defects with grade II L5-S1 anterolisthesis. No acute fracture. IMPRESSION: Interval worsening of the inflammatory changes involving the gallbladder concerning for acute cholecystitis. Ultrasound is recommended for better evaluation of the gallbladder. No calcified gallstone identified. Segmental inflammatory changes and thickening of the second portion of duodenum likely reactive to inflammatory changes of the gallbladder. A small duodenal diverticula with stranding inflammation also likely reactive to inflammatory changes of the gallbladder. Duodenal diverticulitis is less likely. Sigmoid diverticulosis. No evidence of bowel obstruction. Normal appendix. Electronically Signed   By: Anner Crete M.D.   On: 01/21/2015 02:21   Dg Chest 2 View  01/21/2015  CLINICAL DATA:  Worsening right lower quadrant abdominal pain, hypoxia and leukocytosis, acute onset. Initial encounter. EXAM: CHEST  2 VIEW COMPARISON:  Chest radiograph performed 08/28/2010 FINDINGS: The lungs are hypoexpanded, with elevation of the right hemidiaphragm. Mild bilateral atelectasis is suggested. Vascular crowding is seen. No definite pleural effusion or pneumothorax is identified. The cardiomediastinal silhouette is mildly enlarged. The patient is status post median sternotomy. No acute osseous abnormalities are identified. IMPRESSION: 1. Lungs hypoexpanded, with elevation of the right hemidiaphragm. Mild bilateral atelectasis suggested. 2. Mild cardiomegaly. Electronically Signed   By: Garald Balding M.D.   On: 01/21/2015 00:39   Ir Perc Cholecystostomy  01/21/2015  CLINICAL DATA:  81 year old with acute cholecystitis. Right upper quadrant pain and elevated white blood cell count. EXAM:  PERCUTANEOUS CHOLECYSTOSTOMY TUBE PLACEMENT WITH ULTRASOUND AND FLUOROSCOPIC GUIDANCE Physician: Stephan Minister. Henn, MD FLUOROSCOPY TIME:  42 seconds, 4 mGy MEDICATIONS: 1 mg versed, 50 mcg fentanyl. A radiology nurse monitored the patient for moderate sedation. ANESTHESIA/SEDATION: Moderate sedation time: 25 minutes PROCEDURE: Informed consent was obtained for percutaneous cholecystostomy tube placement. The patient was placed supine on the interventional table. Ultrasound was used to localize the gallbladder in the right upper abdomen. The right upper abdomen was prepped and draped in a sterile fashion. Maximal barrier sterile technique was utilized including caps, mask, sterile gowns, sterile gloves, sterile drape, hand hygiene and skin antiseptic. Skin was anesthetized with 1% lidocaine. Using ultrasound guidance, a 21 gauge needle was directed into the distended gallbladder via a transhepatic approach. Wire was advanced into the gallbladder with ultrasound and fluoroscopic guidance. Needle was exchanged for an Accustick dilator set. The tract was dilated to accommodate a 10.2 [place] multipurpose drain. Approximately 80 mL of pink colored purulent fluid was aspirated from the gallbladder. Catheter was sutured to skin and attached to gravity bag. FINDINGS: Distended gallbladder. 80 mL of purulent fluid was removed from the gallbladder. Gallbladder was decompressed at the end of the procedure. Estimated blood loss: Minimal COMPLICATIONS: None IMPRESSION: Successful percutaneous cholecystostomy tube placement. Electronically Signed   By: Markus Daft M.D.   On: 01/21/2015 18:12   Dg Chest Port 1 View  01/21/2015  CLINICAL DATA:  81 year old male with fever and shortness breath. Sepsis. EXAM: PORTABLE CHEST 1 VIEW COMPARISON:  Chest x-ray  01/20/2015. FINDINGS: Lung volumes are very low. There are some ill-defined bibasilar opacities which may reflect areas of atelectasis and/or airspace consolidation (left greater than  right). Mild blunting of left costophrenic sulcus, suggestive of a small left pleural effusion. No evidence of pulmonary edema. Heart size is within normal limits. Upper mediastinal contours are normal. Atherosclerosis in the thoracic aorta. Status post median sternotomy for CABG. IMPRESSION: 1. Llow lung volumes with bibasilar (left greater than right) areas of atelectasis and/or airspace consolidation, and small left pleural effusion. Electronically Signed   By: Vinnie Langton M.D.   On: 01/21/2015 07:42    Anti-infectives: Anti-infectives    Start     Dose/Rate Route Frequency Ordered Stop   01/21/15 1000  piperacillin-tazobactam (ZOSYN) IVPB 3.375 g     3.375 g 12.5 mL/hr over 240 Minutes Intravenous Every 8 hours 01/21/15 0544     01/21/15 0730  piperacillin-tazobactam (ZOSYN) IVPB 3.375 g  Status:  Discontinued     3.375 g 100 mL/hr over 30 Minutes Intravenous  Once 01/21/15 0717 01/21/15 0719   01/21/15 0345  piperacillin-tazobactam (ZOSYN) IVPB 3.375 g     3.375 g 100 mL/hr over 30 Minutes Intravenous  Once 01/21/15 0339 01/21/15 0432       Assessment/Plan  1. Cholecystitis, s/p perc drain -cont perc chole drain for 6-8 weeks, then plan for possible elective cholecystectomy -CT did report inflammatory changes around the duodenum.  He may have some persistent right sided abdominal pain for a few days. -cont IV abx therapy -WBC improved to 13K today from 22K yesterday -clear liquid diet today -mobilize and pulm toilet 2. MMP -per primary service  SCDs/heparin  LOS: 1 day    Alexsus Papadopoulos E 01/22/2015, 8:06 AM Pager: [phone]

## 2015-01-22 NOTE — Progress Notes (Signed)
Chief Complaint: Acute cholecystitis s/p perc cholecystostomy tube 01/21/15 by IR  Subjective: Patient states he feels ok, but c/o worsening shortness of breath and difficulty taking a deep breath. He is tolerating liquids, however has not had much solid food today.   Allergies: Review of patient's allergies indicates no known allergies.  Medications: Prior to Admission medications   Medication Sig Start Date End Date Taking? Authorizing Provider  amLODipine (NORVASC) 5 MG tablet Take 5 mg by mouth daily.     Yes Historical Provider, MD  aspirin 81 MG tablet Take 81 mg by mouth daily.   Yes Historical Provider, MD  atenolol (TENORMIN) 50 MG tablet Take 50 mg by mouth 2 (two) times daily.    Yes Historical Provider, MD  insulin NPH-regular Human (NOVOLIN 70/30) (70-30) 100 UNIT/ML injection Inject 22-36 Units into the skin See admin instructions. Take 36 units in the morning and 22 units at bedtime.   Yes Historical Provider, MD  multivitamin [facility]) per tablet Take 1 tablet by mouth daily.     Yes Historical Provider, MD  Omega-3 Fatty Acids (FISH OIL) 1200 MG CAPS Take 2 capsules by mouth daily.    Yes Historical Provider, MD  omeprazole (PRILOSEC) 20 MG capsule Take 20 mg by mouth daily.  01/06/11  Yes Historical Provider, MD  pioglitazone (ACTOS) 15 MG tablet Take 15 mg by mouth daily.   Yes Historical Provider, MD  simvastatin (ZOCOR) 20 MG tablet Take 20 mg by mouth at bedtime.     Yes Historical Provider, MD   Vital Signs: BP 119/68 mmHg  Pulse 72  Temp(Src) 98.3 F (36.8 C) (Oral)  Resp 18  Ht 6' (1.829 m)  Wt 189 lb 8 oz (85.957 kg)  BMI 25.70 kg/m2  SpO2 90%  Physical Exam General: A&Ox3, appears short of breath Abd: Distended, tenderness at drain site, RUQ drain intact with minimal bloody output, 30 cc (90cc/24 hrs)  Imaging: Ct Abdomen Pelvis Wo Contrast  01/21/2015  CLINICAL DATA:  81 year old male with abdominal pain EXAM: CT ABDOMEN AND PELVIS WITHOUT  CONTRAST TECHNIQUE: Multidetector CT imaging of the abdomen and pelvis was performed following the standard protocol without IV contrast. COMPARISON:  Abdominal CT and ultrasound dated 01/19/2015 FINDINGS: Evaluation of this exam is limited in the absence of intravenous contrast. Linear bibasilar subsegmental atelectasis/ scarring. There is advanced coronary vascular calcification with CABG clips. No intra-abdominal free air or free fluid. Diffuse hepatic steatosis. The gallbladder is moderately distended. There is thickening and inflammatory changes of the gallbladder wall with stranding of the pericholecystic fat most compatible with acute cholecystitis. There has been interval progression of the inflammatory changes involving the gallbladder compared to the prior study. No calcified stone identified. Ultrasound is recommended for further evaluation of the gallbladder. The pancreas, spleen, and adrenal glands appear unremarkable. Small parapelvic renal cysts again noted. There is no hydronephrosis or nephrolithiasis. The visualized ureters and urinary bladder appear unremarkable. The prostate gland is enlarged measuring up to 5.7 cm transverse axial dimension. There is sigmoid diverticulosis without active inflammation. There is segmental inflammatory changes and thickening of the second portion of the duodenum, likely reactive to inflammatory changes of the gallbladder. Multiple duodenal diverticula noted measuring up to 1 point 5 cm in the distal duodenum. A smaller duodenal diverticulum is noted in the second portion of the duodenum with surrounding inflammation. This inflammatory changes are likely reactive to inflammatory changes of the gallbladder and less likely represent duodenal diverticulitis. There is no evidence of  bowel obstruction. Normal appendix. There is aortoiliac atherosclerotic disease. No portal venous gas identified. There is no adenopathy. The abdominal wall soft tissues appear grossly  unremarkable. There is degenerative changes of the spine. There are bilateral L5 pars defects with grade II L5-S1 anterolisthesis. No acute fracture. IMPRESSION: Interval worsening of the inflammatory changes involving the gallbladder concerning for acute cholecystitis. Ultrasound is recommended for better evaluation of the gallbladder. No calcified gallstone identified. Segmental inflammatory changes and thickening of the second portion of duodenum likely reactive to inflammatory changes of the gallbladder. A small duodenal diverticula with stranding inflammation also likely reactive to inflammatory changes of the gallbladder. Duodenal diverticulitis is less likely. Sigmoid diverticulosis. No evidence of bowel obstruction. Normal appendix. Electronically Signed   By: Anner Crete M.D.   On: 01/21/2015 02:21   Ct Abdomen Pelvis Wo Contrast  01/19/2015  CLINICAL DATA:  Mid abdominal pain with nausea and vomiting since last night. EXAM: CT ABDOMEN AND PELVIS WITHOUT CONTRAST TECHNIQUE: Multidetector CT imaging of the abdomen and pelvis was performed following the standard protocol without IV contrast. COMPARISON:  CT abdomen and pelvis dated 08/28/2010. FINDINGS: Gallbladder is moderately distended. Gallbladder walls are at least mildly thickened and indistinct with subtle pericholecystic edema suggesting acute cholecystitis. Liver, spleen, pancreas, and adrenal glands are unremarkable. Parapelvic renal cysts again noted bilaterally. Kidneys otherwise unremarkable without stone or hydronephrosis. No ureteral or bladder calculi identified. Bladder is moderately distended but otherwise unremarkable. Prostate gland is moderately enlarged causing some mass effect on the bladder base. Bowel is normal in caliber. Scattered diverticulosis noted within the sigmoid colon without evidence of acute diverticulitis. Appendix is normal. No free fluid or abscess collection. No free intraperitoneal air. No enlarged lymph nodes  seen. Atherosclerotic changes are seen along the walls of the normal-caliber abdominal aorta. There is small hiatal hernia. Mild scarring/atelectasis at the adjacent lung bases. Small benign granulomas also noted at the right lung base. Fairly extensive coronary artery calcifications noted at the heart base. Median sternotomy wires in place, presumably related to a previous CABG. Scattered degenerative changes are seen throughout the thoracolumbar spine, moderate in degree. Chronic pars interarticularis defects noted at the L5-S1 level with associated grade 2 anterolisthesis of L5 on S1. No acute- appearing osseous abnormality. IMPRESSION: 1. Suspect acute cholecystitis. Recommend gallbladder ultrasound and/or nuclear medicine HIDA scan for confirmation. 2. Prostate enlargement, similar to previous CT, with associated mass effect on the bladder base. 3. Small hiatal hernia. 4. Colonic diverticulosis without evidence of acute diverticulitis. 5. Bilateral chronic pars interarticularis defects at L5-S1 with resultant grade 2 spondylolisthesis of L5 on S1. This was also described on the previous CT report. These results were called by telephone at the time of interpretation on 01/19/2015 at 7:20 am to Dr. [name] , who verbally acknowledged these results. Electronically Signed   By: Franki Cabot M.D.   On: 01/19/2015 07:22   Dg Chest 2 View  01/21/2015  CLINICAL DATA:  Worsening right lower quadrant abdominal pain, hypoxia and leukocytosis, acute onset. Initial encounter. EXAM: CHEST  2 VIEW COMPARISON:  Chest radiograph performed 08/28/2010 FINDINGS: The lungs are hypoexpanded, with elevation of the right hemidiaphragm. Mild bilateral atelectasis is suggested. Vascular crowding is seen. No definite pleural effusion or pneumothorax is identified. The cardiomediastinal silhouette is mildly enlarged. The patient is status post median sternotomy. No acute osseous abnormalities are identified. IMPRESSION: 1.  Lungs hypoexpanded, with elevation of the right hemidiaphragm. Mild bilateral atelectasis suggested. 2. Mild cardiomegaly. Electronically Signed  By: Garald Balding M.D.   On: 01/21/2015 00:39   Ir Perc Cholecystostomy  01/21/2015  CLINICAL DATA:  81 year old with acute cholecystitis. Right upper quadrant pain and elevated white blood cell count. EXAM: PERCUTANEOUS CHOLECYSTOSTOMY TUBE PLACEMENT WITH ULTRASOUND AND FLUOROSCOPIC GUIDANCE Physician: Stephan Minister. Henn, MD FLUOROSCOPY TIME:  42 seconds, 4 mGy MEDICATIONS: 1 mg versed, 50 mcg fentanyl. A radiology nurse monitored the patient for moderate sedation. ANESTHESIA/SEDATION: Moderate sedation time: 25 minutes PROCEDURE: Informed consent was obtained for percutaneous cholecystostomy tube placement. The patient was placed supine on the interventional table. Ultrasound was used to localize the gallbladder in the right upper abdomen. The right upper abdomen was prepped and draped in a sterile fashion. Maximal barrier sterile technique was utilized including caps, mask, sterile gowns, sterile gloves, sterile drape, hand hygiene and skin antiseptic. Skin was anesthetized with 1% lidocaine. Using ultrasound guidance, a 21 gauge needle was directed into the distended gallbladder via a transhepatic approach. Wire was advanced into the gallbladder with ultrasound and fluoroscopic guidance. Needle was exchanged for an Accustick dilator set. The tract was dilated to accommodate a 10.2 [place] multipurpose drain. Approximately 80 mL of pink colored purulent fluid was aspirated from the gallbladder. Catheter was sutured to skin and attached to gravity bag. FINDINGS: Distended gallbladder. 80 mL of purulent fluid was removed from the gallbladder. Gallbladder was decompressed at the end of the procedure. Estimated blood loss: Minimal COMPLICATIONS: None IMPRESSION: Successful percutaneous cholecystostomy tube placement. Electronically Signed   By: Markus Daft M.D.   On:  01/21/2015 18:12   Dg Chest Port 1 View  01/21/2015  CLINICAL DATA:  81 year old male with fever and shortness breath. Sepsis. EXAM: PORTABLE CHEST 1 VIEW COMPARISON:  Chest x-ray 01/20/2015. FINDINGS: Lung volumes are very low. There are some ill-defined bibasilar opacities which may reflect areas of atelectasis and/or airspace consolidation (left greater than right). Mild blunting of left costophrenic sulcus, suggestive of a small left pleural effusion. No evidence of pulmonary edema. Heart size is within normal limits. Upper mediastinal contours are normal. Atherosclerosis in the thoracic aorta. Status post median sternotomy for CABG. IMPRESSION: 1. Llow lung volumes with bibasilar (left greater than right) areas of atelectasis and/or airspace consolidation, and small left pleural effusion. Electronically Signed   By: Vinnie Langton M.D.   On: 01/21/2015 07:42   US Abdomen Limited Ruq  01/19/2015  CLINICAL DATA:  Right upper quadrant abdominal pain. Nausea and vomiting. EXAM: US ABDOMEN LIMITED - RIGHT UPPER QUADRANT COMPARISON:  CT 01/19/2015.  Ultrasound 12/03/2014. FINDINGS: Gallbladder: No gallstones or wall thickening visualized. No sonographic Murphy sign noted. Common bile duct: Diameter: 3.3 mm Liver: No focal lesion identified. Within normal limits in parenchymal echogenicity. Evaluation is mildly limited by bowel gas. IMPRESSION: Negative right upper quadrant ultrasound. No evidence of cholecystitis or biliary dilatation. Electronically Signed   By: Richardean Sale M.D.   On: 01/19/2015 08:16    Labs:  CBC:  Recent Labs  01/19/15 0033 01/20/15 2034 01/21/15 0815 01/22/15 0449  WBC 13.1* 24.4* 22.4* 13.0*  HGB 14.7 14.5 13.1 12.6*  HCT 42.4 42.4 39.0 37.6*  PLT 179 164 133* 141*    COAGS:  Recent Labs  01/21/15 0815  INR 1.58*  APTT 33    BMP:  Recent Labs  01/19/15 0033 01/20/15 2034 01/21/15 0815 01/22/15 0449  NA 138 135 135 137  K 3.3* 4.2 3.6 3.5  CL 107  104 107 109  CO2 20* 22 20* 20*  GLUCOSE 230*  179* 170* 182*  BUN 23* 26* 27* 30*  CALCIUM 9.4 9.2 8.1* 8.1*  CREATININE 1.74* 2.02* 2.01* 2.06*  GFRNONAA 36* 30* 30* 29*  GFRAA 41* 34* 35* 34*    LIVER FUNCTION TESTS:  Recent Labs  01/19/15 0033 01/20/15 2034 01/21/15 0815 01/22/15 0449  BILITOT 0.8 2.1* 1.6* 1.5*  AST 26 31 25 25   ALT 21 20 16* 18  ALKPHOS 75 54 47 40  PROT 7.9 7.0 6.1* 5.8*  ALBUMIN 3.8 3.1* 2.6* 2.3*    Assessment and Plan: Acute cholecystitis  S/p perc cholecystostomy drain 01/21/15  Wbc trending down 13 (22.4), H/H stable, purulent output 80 ml removed time of procedure, Cx GNR Continue to flush 5 ml sterile saline TID, monitor daily output Plans per [facility] and primary team Worsening shortness of breath, per [facility] repeat CXR   Signed: Hedy Jacob 01/22/2015, 2:11 PM   I spent a total of 15 Minutes at the the patient's bedside AND on the patient's hospital floor or unit, greater than 50% of which was counseling/coordinating care for acute cholecystitis

## 2015-01-23 ENCOUNTER — Inpatient Hospital Stay (HOSPITAL_COMMUNITY): Payer: PPO

## 2015-01-23 DIAGNOSIS — E118 Type 2 diabetes mellitus with unspecified complications: Secondary | ICD-10-CM

## 2015-01-23 DIAGNOSIS — J189 Pneumonia, unspecified organism: Secondary | ICD-10-CM

## 2015-01-23 DIAGNOSIS — R7989 Other specified abnormal findings of blood chemistry: Secondary | ICD-10-CM

## 2015-01-23 DIAGNOSIS — I2510 Atherosclerotic heart disease of native coronary artery without angina pectoris: Secondary | ICD-10-CM

## 2015-01-23 LAB — CBC
HEMATOCRIT: 40.8 % (ref 39.0–52.0)
HEMOGLOBIN: 13.8 g/dL (ref 13.0–17.0)
MCH: 29.9 pg (ref 26.0–34.0)
MCHC: 33.8 g/dL (ref 30.0–36.0)
MCV: 88.3 fL (ref 78.0–100.0)
Platelets: 182 10*3/uL (ref 150–400)
RBC: 4.62 MIL/uL (ref 4.22–5.81)
RDW: 13.4 % (ref 11.5–15.5)
WBC: 12.6 10*3/uL — ABNORMAL HIGH (ref 4.0–10.5)

## 2015-01-23 LAB — NM MYOCAR MULTI W/SPECT W/WALL MOTION / EF
CHL CUP MPHR: 141 {beats}/min
CHL CUP NUCLEAR SDS: 2
CHL CUP NUCLEAR SRS: 8
CHL CUP RESTING HR STRESS: 74 {beats}/min
CSEPEW: 1 METS
CSEPHR: 64 %
Exercise duration (min): 11 min
Exercise duration (sec): 34 s
LHR: 0.34
LV dias vol: 86 mL
LV sys vol: 29 mL
Peak HR: 91 {beats}/min
SSS: 10
TID: 1.44

## 2015-01-23 LAB — GLUCOSE, CAPILLARY
GLUCOSE-CAPILLARY: 174 mg/dL — AB (ref 65–99)
GLUCOSE-CAPILLARY: 175 mg/dL — AB (ref 65–99)
GLUCOSE-CAPILLARY: 160 mg/dL — AB (ref 65–99)
Glucose-Capillary: 213 mg/dL — ABNORMAL HIGH (ref 65–99)

## 2015-01-23 LAB — BASIC METABOLIC PANEL
ANION GAP: 13 (ref 5–15)
BUN: 31 mg/dL — ABNORMAL HIGH (ref 6–20)
CHLORIDE: 107 mmol/L (ref 101–111)
CO2: 18 mmol/L — AB (ref 22–32)
CREATININE: 1.91 mg/dL — AB (ref 0.61–1.24)
Calcium: 8.5 mg/dL — ABNORMAL LOW (ref 8.9–10.3)
GFR calc non Af Amer: 32 mL/min — ABNORMAL LOW (ref >60)
GFR, EST AFRICAN AMERICAN: 37 mL/min — AB (ref >60)
Glucose, Bld: 180 mg/dL — ABNORMAL HIGH (ref 65–99)
POTASSIUM: 3.5 mmol/L (ref 3.5–5.1)
SODIUM: 138 mmol/L (ref 135–145)

## 2015-01-23 LAB — CULTURE, BODY FLUID W GRAM STAIN -BOTTLE

## 2015-01-23 LAB — STREP PNEUMONIAE URINARY ANTIGEN: Strep Pneumo Urinary Antigen: NEGATIVE

## 2015-01-23 LAB — CULTURE, BODY FLUID-BOTTLE

## 2015-01-23 LAB — MRSA PCR SCREENING: MRSA BY PCR: NEGATIVE

## 2015-01-23 MED ORDER — TECHNETIUM TC 99M SESTAMIBI - CARDIOLITE
30.0000 | Freq: Once | INTRAVENOUS | Status: AC | PRN
  Administered 2015-01-23: 12:00:00 30 via INTRAVENOUS

## 2015-01-23 MED ORDER — DEXTROSE 5 % IV SOLN
500.0000 mg | INTRAVENOUS | Status: DC
  Administered 2015-01-23 – 2015-01-25 (×3): 500 mg via INTRAVENOUS
  Filled 2015-01-23 (×4): qty 500

## 2015-01-23 MED ORDER — FUROSEMIDE 10 MG/ML IJ SOLN
40.0000 mg | Freq: Once | INTRAMUSCULAR | Status: AC
  Administered 2015-01-23: 40 mg via INTRAVENOUS
  Filled 2015-01-23: qty 4

## 2015-01-23 MED ORDER — VANCOMYCIN HCL 10 G IV SOLR
1250.0000 mg | INTRAVENOUS | Status: DC
  Administered 2015-01-23: 1250 mg via INTRAVENOUS
  Filled 2015-01-23 (×2): qty 1250

## 2015-01-23 MED ORDER — REGADENOSON 0.4 MG/5ML IV SOLN
0.4000 mg | Freq: Once | INTRAVENOUS | Status: AC
  Administered 2015-01-23: 0.4 mg via INTRAVENOUS
  Filled 2015-01-23: qty 5

## 2015-01-23 MED ORDER — TECHNETIUM TC 99M SESTAMIBI GENERIC - CARDIOLITE
10.0000 | Freq: Once | INTRAVENOUS | Status: AC | PRN
  Administered 2015-01-23: 10 via INTRAVENOUS

## 2015-01-23 MED ORDER — ALBUTEROL SULFATE (2.5 MG/3ML) 0.083% IN NEBU
2.5000 mg | INHALATION_SOLUTION | Freq: Once | RESPIRATORY_TRACT | Status: AC
  Administered 2015-01-23: 2.5 mg via RESPIRATORY_TRACT
  Filled 2015-01-23: qty 3

## 2015-01-23 MED ORDER — AMINOPHYLLINE 25 MG/ML IV SOLN: 250.0000 mg | Freq: Once | INTRAVENOUS | Status: DC

## 2015-01-23 MED ORDER — REGADENOSON 0.4 MG/5ML IV SOLN
INTRAVENOUS | Status: AC
  Filled 2015-01-23: qty 5

## 2015-01-23 MED ORDER — SODIUM CHLORIDE 0.9 % IV SOLN
3.0000 g | Freq: Four times a day (QID) | INTRAVENOUS | Status: DC
  Administered 2015-01-23 – 2015-01-27 (×17): 3 g via INTRAVENOUS
  Filled 2015-01-23 (×20): qty 3

## 2015-01-23 NOTE — Progress Notes (Signed)
PT Cancellation Note  Patient Details Name: Travoris Luney MRN: [id number] DOB: 1933/05/20   Cancelled Treatment:    Reason Eval/Treat Not Completed: Patient at procedure or test/unavailable.  Pt just left floor for Nuclear Medicine.  Will f/u another time.     Jeralyn Nolden, Thornton Papas 01/23/2015, 9:20 AM

## 2015-01-23 NOTE — Progress Notes (Signed)
Lexiscan MV performed. 1 Day study, [facility] to read.  Pt had wheezing with the [place].  This improved with aminophylline but sats remained low.   Sats have been dropping since admission, will ck 2 V CXR.  Rosaria Ferries, Hershal Coria 01/23/2015 11:43 AM Beeper [phone]

## 2015-01-23 NOTE — Progress Notes (Signed)
[facility] Progress Note    Subjective: Patient having some pain when he takes a deep breath.  Pain at drain site  Objective: Vital signs in last 24 hours: Temp:  [98 F (36.7 C)-98.8 F (37.1 C)] 98.6 F (37 C) (12/08 0415) Pulse Rate:  [72-79] 79 (12/08 0415) Resp:  [18-24] 21 (12/08 0415) BP: (119-147)/(56-74) 144/56 mmHg (12/08 0415) SpO2:  [87 %-90 %] 87 % (12/08 0415) Weight:  [85.322 kg (188 lb 1.6 oz)] 85.322 kg (188 lb 1.6 oz) (12/08 0415) Last BM Date: 01/21/15  Intake/Output from previous day: 12/07 0701 - 12/08 0700 In: 685 [P.O.:670] Out: 725 [Urine:625; Drains:100] Intake/Output this shift: Total I/O In: 0  Out: 200 [Urine:200]  General: No real acute distress.  Wants to drink fluids, but NPO for stress test this AM  Lungs: Clear  Abd: Bloody possibly mixed with bilious drainage from perc drain.  Hypoactive bowel sounds.  No peritonitis.  Extremities: No changes  Neuro: Intact  Lab Results:  @LABLAST2 (wbc:2,hgb:2,hct:2,plt:2) BMET ) Recent Labs  01/22/15 0449 01/23/15 0420  NA 137 138  K 3.5 3.5  CL 109 107  CO2 20* 18*  GLUCOSE 182* 180*  BUN 30* 31*  CREATININE 2.06* 1.91*  CALCIUM 8.1* 8.5*   PT/INR  Recent Labs  01/21/15 0815  LABPROT 18.9*  INR 1.58*   ABG No results for input(s): PHART, HCO3 in the last 72 hours.  Invalid input(s): PCO2, PO2  Studies/Results: Ir Perc Cholecystostomy  01/21/2015  CLINICAL DATA:  81 year old with acute cholecystitis. Right upper quadrant pain and elevated white blood cell count. EXAM: PERCUTANEOUS CHOLECYSTOSTOMY TUBE PLACEMENT WITH ULTRASOUND AND FLUOROSCOPIC GUIDANCE Physician: Stephan Minister. Henn, MD FLUOROSCOPY TIME:  42 seconds, 4 mGy MEDICATIONS: 1 mg versed, 50 mcg fentanyl. A radiology nurse monitored the patient for moderate sedation. ANESTHESIA/SEDATION: Moderate sedation time: 25 minutes PROCEDURE: Informed consent was obtained for percutaneous cholecystostomy tube placement. The patient was  placed supine on the interventional table. Ultrasound was used to localize the gallbladder in the right upper abdomen. The right upper abdomen was prepped and draped in a sterile fashion. Maximal barrier sterile technique was utilized including caps, mask, sterile gowns, sterile gloves, sterile drape, hand hygiene and skin antiseptic. Skin was anesthetized with 1% lidocaine. Using ultrasound guidance, a 21 gauge needle was directed into the distended gallbladder via a transhepatic approach. Wire was advanced into the gallbladder with ultrasound and fluoroscopic guidance. Needle was exchanged for an Accustick dilator set. The tract was dilated to accommodate a 10.2 [place] multipurpose drain. Approximately 80 mL of pink colored purulent fluid was aspirated from the gallbladder. Catheter was sutured to skin and attached to gravity bag. FINDINGS: Distended gallbladder. 80 mL of purulent fluid was removed from the gallbladder. Gallbladder was decompressed at the end of the procedure. Estimated blood loss: Minimal COMPLICATIONS: None IMPRESSION: Successful percutaneous cholecystostomy tube placement. Electronically Signed   By: Markus Daft M.D.   On: 01/21/2015 18:12   Dg Chest Port 1 View  01/22/2015  CLINICAL DATA:  Onset severe shortness of breath today. Hypoxia. Coronary artery disease. EXAM: PORTABLE CHEST 1 VIEW COMPARISON:  01/21/2015 FINDINGS: Very low lung volumes are seen with bibasilar atelectasis. Interstitial prominence is also noted, and mild interstitial edema cannot be excluded. Heart size is stable allowing for decreased lung volumes. Prior CABG noted. IMPRESSION: Very low lung volumes with bibasilar atelectasis. Mild interstitial edema cannot be excluded. Electronically Signed   By: Earle Gell M.D.   On: 01/22/2015 18:43  Anti-infectives: Anti-infectives    Start     Dose/Rate Route Frequency Ordered Stop   01/21/15 1000  piperacillin-tazobactam (ZOSYN) IVPB 3.375 g     3.375 g 12.5 mL/hr  over 240 Minutes Intravenous Every 8 hours 01/21/15 0544     01/21/15 0730  piperacillin-tazobactam (ZOSYN) IVPB 3.375 g  Status:  Discontinued     3.375 g 100 mL/hr over 30 Minutes Intravenous  Once 01/21/15 0717 01/21/15 0719   01/21/15 0345  piperacillin-tazobactam (ZOSYN) IVPB 3.375 g     3.375 g 100 mL/hr over 30 Minutes Intravenous  Once 01/21/15 0339 01/21/15 0432      Assessment/Plan: s/p  Advance diet after stress test.  LOS: 2 days   Kathryne Eriksson. Dahlia Bailiff, MD, FACS [phone] [facility] Surgery 01/23/2015

## 2015-01-23 NOTE — Progress Notes (Signed)
[facility] PROGRESS NOTE  Fredick Weaks [id number] DOB: 1933/06/24 DOA: 01/20/2015 PCP: Donnajean Lopes, MD  Brief Summary  Mr. Crick is a 81 year old male with a past medical history significant for diabetes mellitus type 2, HLD, CAD s/p CABG; who presented with progressively worsening abdominal pain for 3 days.  In the ER, fever of 101.19F. Initial lab work showed a WBC of 24.4 and lactic acid level of 2.1 CT scan demonstrated acute cholecystitis.  He was started on zosyn.  Surgery recommended conservative management given the patient's CAD/comorbidities.    Assessment/Plan  Sepsis secondary to Acute cholecystitis (fever, leukocytosis, tachypnea), Fever trending down on antibiotics.  Leukocytosis stable.  Currently on clear liquid diet Biliary culture with pansensitive e coli. Needs anaerobic coverage as well change to unasyn -  Cholecystostomy drain placed on 12/6. Advance diet per surgery. -  Blood cultures pending   Hypoxia: continues. CXR an expiratory film, but may be a component of fluid overload. We'll give a dose of IV Lasix. Has no history of COPD but will try bronchodilator. Likely splinting and atelectasis contributing. Continue aggressive pulmonary toilet. Out of bed. PA and lateral chest x-ray done today after stress test shows possible developing left lower lobe infiltrate. Will add vancomycin and azithromycin to cover pneumonia. Unclear whether this was present on admission. Check MRSA PCR, urine Legionella, urine strep antigens. Pro BMP at 441. Echocardiogram with normal ejection fraction, grade 1 diastolic dysfunction.  Coronary artery disease S/P CABG, was previously asymptomatic with MI:   Elevated cardiac enzymes: Myoview pending. -  Continue ASA, statin, BB   Acute kidney injury versus progression of chronic kidney disease, creatinine 1.5 approximately one year ago -  Creatinine stable around 2. Continue to monitor.  Diabetes mellitus type 2 with  complications ([place]): Last hemoglobin A1c unknown at this time. - Held Actos and 70/30 regimen of insulin - CBGcontrolled  Essential Hypertension, stable  Hypercholesteremia -Continue simvastatin  GERD Continue PPI  Diet:  CLD post-procedure and advance to diabetic, healthy heart when tolerating clears Access:  PIV IVF:  Yes until tolerating diet Proph:  Heparin   Code Status: full Family Communication: patient is lucid Disposition Plan:  Pending clinical improvement after cholecystomy drain placed.   Consultants:  IR  General surgery  Procedures:  CT abd/pelvis  Perc cholecystostomy drain on 12/6   Antibiotics:  Zosyn 12/5 >    HPI/Subjective: No vomiting. Pain moderately controlled. Admits to shortness of breath when asked. No cough.  Objective: Filed Vitals:   01/23/15 1141 01/23/15 1142 01/23/15 1143 01/23/15 1333  BP: 164/88 157/86  143/69  Pulse: 80 80 82 89  Temp:      TempSrc:      Resp:    20  Height:      Weight:      SpO2:    87%    Intake/Output Summary (Last 24 hours) at 01/23/15 1349 Last data filed at 01/23/15 1333  Gross per 24 hour  Intake    250 ml  Output    695 ml  Net   -445 ml   Filed Weights   01/21/15 0713 01/22/15 0413 01/23/15 0415  Weight: 86.9 kg (191 lb 9.3 oz) 85.957 kg (189 lb 8 oz) 85.322 kg (188 lb 1.6 oz)   Body mass index is 25.51 kg/(m^2).  Exam:   General:  Alert. Uncomfortable appearing. Tachypneic  Cardiovascular:  RRR, nl S1, S2 no mrg, 2+ pulses, warm extremities  Respiratory:  Splinting. Rales at bases. No rhonchi  or wheeze.  Abdomen:   Nondistended. Bowel sounds present. Right upper quadrant tenderness  Extremities: Trace pitting edema  Data Reviewed: Basic Metabolic Panel:  Recent Labs Lab 01/19/15 0033 01/20/15 2034 01/21/15 0815 01/22/15 0449 01/23/15 0420  NA 138 135 135 137 138  K 3.3* 4.2 3.6 3.5 3.5  CL 107 104 107 109 107  CO2 20* 22 20* 20* 18*  GLUCOSE 230* 179* 170* 182*  180*  BUN 23* 26* 27* 30* 31*  CREATININE 1.74* 2.02* 2.01* 2.06* 1.91*  CALCIUM 9.4 9.2 8.1* 8.1* 8.5*  MG  --   --  1.4*  --   --   PHOS  --   --  2.6  --   --    Liver Function Tests:  Recent Labs Lab 01/19/15 0033 01/20/15 2034 01/21/15 0815 01/22/15 0449  AST 26 31 25 25   ALT 21 20 16* 18  ALKPHOS 75 54 47 40  BILITOT 0.8 2.1* 1.6* 1.5*  PROT 7.9 7.0 6.1* 5.8*  ALBUMIN 3.8 3.1* 2.6* 2.3*    Recent Labs Lab 01/19/15 0033 01/20/15 2034  LIPASE 38 22   No results for input(s): AMMONIA in the last 168 hours. CBC:  Recent Labs Lab 01/19/15 0033 01/20/15 2034 01/21/15 0815 01/22/15 0449 01/23/15 0420  WBC 13.1* 24.4* 22.4* 13.0* 12.6*  NEUTROABS  --   --  17.5*  --   --   HGB 14.7 14.5 13.1 12.6* 13.8  HCT 42.4 42.4 39.0 37.6* 40.8  MCV 87.8 88.7 90.1 88.9 88.3  PLT 179 164 133* 141* 182    Recent Results (from the past 240 hour(s))  Blood culture (routine x 2)     Status: None (Preliminary result)   Collection Time: 01/20/15 11:25 PM  Result Value Ref Range Status   Specimen Description BLOOD RIGHT ARM  Final   Special Requests BOTTLES DRAWN AEROBIC AND ANAEROBIC 5ML  Final   Culture NO GROWTH 2 DAYS  Final   Report Status PENDING  Incomplete  Blood culture (routine x 2)     Status: None (Preliminary result)   Collection Time: 01/20/15 11:30 PM  Result Value Ref Range Status   Specimen Description BLOOD LEFT HAND  Final   Special Requests BOTTLES DRAWN AEROBIC AND ANAEROBIC 5ML  Final   Culture NO GROWTH 2 DAYS  Final   Report Status PENDING  Incomplete  Culture, body fluid-bottle     Status: None   Collection Time: 01/21/15  4:00 PM  Result Value Ref Range Status   Specimen Description FLUID GALL BLADDER  Final   Special Requests BOTTLES DRAWN AEROBIC AND ANAEROBIC 10CC  Final   Gram Stain   Final    GRAM NEGATIVE RODS IN BOTH AEROBIC AND ANAEROBIC BOTTLES CRITICAL RESULT CALLED TO, READ BACK BY AND VERIFIED WITH: Ophelia Charter RN [id number] 01/21/15 A  BROWNING    Culture ESCHERICHIA COLI  Final   Report Status 01/23/2015 FINAL  Final   Organism ID, Bacteria ESCHERICHIA COLI  Final      Susceptibility   Escherichia coli - MIC*    AMPICILLIN <=2 SENSITIVE Sensitive     CEFAZOLIN <=4 SENSITIVE Sensitive     CEFEPIME <=1 SENSITIVE Sensitive     CEFTAZIDIME <=1 SENSITIVE Sensitive     CEFTRIAXONE <=1 SENSITIVE Sensitive     CIPROFLOXACIN <=0.25 SENSITIVE Sensitive     GENTAMICIN <=1 SENSITIVE Sensitive     IMIPENEM <=0.25 SENSITIVE Sensitive     TRIMETH/SULFA <=20 SENSITIVE Sensitive  AMPICILLIN/SULBACTAM <=2 SENSITIVE Sensitive     PIP/TAZO <=4 SENSITIVE Sensitive     * ESCHERICHIA COLI  Gram stain     Status: None   Collection Time: 01/21/15  4:00 PM  Result Value Ref Range Status   Specimen Description FLUID GALL BLADDER  Final   Special Requests BOTTLES DRAWN AEROBIC AND ANAEROBIC 10CC  Final   Gram Stain   Final    ABUNDANT WBC PRESENT, PREDOMINANTLY PMN GRAM NEGATIVE RODS Gram Stain Report Called to,Read Back By and Verified With: N BUCK RN [id number] 01/21/15 A BROWNING    Report Status 01/21/2015 FINAL  Final     Studies: Dg Chest 2 View  01/23/2015  CLINICAL DATA:  SOB and weakness today; hx CAD, diabetic; best inspiration possible on AP EXAM: CHEST  2 VIEW COMPARISON:  01/22/2015 FINDINGS: Shallow lung inflation. Heart is enlarged. Patchy density at the left lung base partially obscures the hemidiaphragm. Findings consistent with atelectasis or developing infiltrate. There has been some improvement in overall aeration. IMPRESSION: Improved mild edema. Developing left lower lobe atelectasis or infiltrate. Electronically Signed   By: Nolon Nations M.D.   On: 01/23/2015 13:39   Ir Perc Cholecystostomy  01/21/2015  CLINICAL DATA:  81 year old with acute cholecystitis. Right upper quadrant pain and elevated white blood cell count. EXAM: PERCUTANEOUS CHOLECYSTOSTOMY TUBE PLACEMENT WITH ULTRASOUND AND FLUOROSCOPIC GUIDANCE  Physician: Stephan Minister. Henn, MD FLUOROSCOPY TIME:  42 seconds, 4 mGy MEDICATIONS: 1 mg versed, 50 mcg fentanyl. A radiology nurse monitored the patient for moderate sedation. ANESTHESIA/SEDATION: Moderate sedation time: 25 minutes PROCEDURE: Informed consent was obtained for percutaneous cholecystostomy tube placement. The patient was placed supine on the interventional table. Ultrasound was used to localize the gallbladder in the right upper abdomen. The right upper abdomen was prepped and draped in a sterile fashion. Maximal barrier sterile technique was utilized including caps, mask, sterile gowns, sterile gloves, sterile drape, hand hygiene and skin antiseptic. Skin was anesthetized with 1% lidocaine. Using ultrasound guidance, a 21 gauge needle was directed into the distended gallbladder via a transhepatic approach. Wire was advanced into the gallbladder with ultrasound and fluoroscopic guidance. Needle was exchanged for an Accustick dilator set. The tract was dilated to accommodate a 10.2 [place] multipurpose drain. Approximately 80 mL of pink colored purulent fluid was aspirated from the gallbladder. Catheter was sutured to skin and attached to gravity bag. FINDINGS: Distended gallbladder. 80 mL of purulent fluid was removed from the gallbladder. Gallbladder was decompressed at the end of the procedure. Estimated blood loss: Minimal COMPLICATIONS: None IMPRESSION: Successful percutaneous cholecystostomy tube placement. Electronically Signed   By: Markus Daft M.D.   On: 01/21/2015 18:12   Dg Chest Port 1 View  01/22/2015  CLINICAL DATA:  Onset severe shortness of breath today. Hypoxia. Coronary artery disease. EXAM: PORTABLE CHEST 1 VIEW COMPARISON:  01/21/2015 FINDINGS: Very low lung volumes are seen with bibasilar atelectasis. Interstitial prominence is also noted, and mild interstitial edema cannot be excluded. Heart size is stable allowing for decreased lung volumes. Prior CABG noted. IMPRESSION: Very low  lung volumes with bibasilar atelectasis. Mild interstitial edema cannot be excluded. Electronically Signed   By: Earle Gell M.D.   On: 01/22/2015 18:43   Echo Left ventricle: The cavity size was normal. There was moderate focal basal hypertrophy of the septum. Systolic function was normal. The estimated ejection fraction was in the range of 55% to 60%. Wall motion was normal; there were no regional wall motion abnormalities. Doppler parameters are  consistent with abnormal left ventricular relaxation (grade 1 diastolic dysfunction). - Aortic valve: Trileaflet; mildly thickened, moderately calcified leaflets. There was moderate regurgitation. - Aorta: The aorta was mildly calcified. Aortic root dimension: 41 mm (ED). - Ascending aorta: The ascending aorta was mildly dilated. - Mitral valve: There was mild regurgitation. - Left atrium: The atrium was mildly dilated. - Pulmonary arteries: Systolic pressure was mildly increased. PA peak pressure: 37 mm Hg (S).  Impressions:  - Compared to the prior study, there has been no significant interval change.  Scheduled Meds: . albuterol  2.5 mg Nebulization Once  . amLODipine  5 mg Oral Daily  . ampicillin-sulbactam (UNASYN) IV  3 g Intravenous Q6H  . aspirin  81 mg Oral Daily  . atenolol  50 mg Oral BID  . azithromycin  500 mg Intravenous Q24H  . heparin  5,000 Units Subcutaneous 3 times per day  . insulin aspart  0-9 Units Subcutaneous TID WC  . multivitamin with minerals  1 tablet Oral Daily  . omega-3 acid ethyl esters  1 g Oral Daily  . pantoprazole  40 mg Oral Daily  . regadenoson      . simvastatin  20 mg Oral QHS  . sodium chloride  3 mL Intravenous Q12H   Continuous Infusions:    Principal Problem:   Acute cholecystitis Active Problems:   Coronary artery disease   Hypercholesteremia   S/P CABG (coronary artery bypass graft)   Diabetes mellitus type 2 with complications (HCC)   Sepsis (HCC)    Hypoxia   Elevated troponin   Pneumonia    Time spent: 35 min    [facility] [email or web address], password [facility] 01/23/2015, 1:49 PM  LOS: 2 days

## 2015-01-23 NOTE — Progress Notes (Signed)
ANTIBIOTIC CONSULT NOTE - INITIAL  Pharmacy Consult for vancomycin,  Indication: rule out pneumonia  No Known Allergies Patient Measurements: Height: 6' (182.9 cm) Weight: 188 lb 1.6 oz (85.322 kg) IBW/kg (Calculated) : 77.6 Vital Signs: Temp: 98.6 F (37 C) (12/08 0415) Temp Source: Oral (12/08 0415) BP: 143/69 mmHg (12/08 1333) Pulse Rate: 89 (12/08 1333) Intake/Output from previous day: 12/07 0701 - 12/08 0700 In: 685 [P.O.:670] Out: 725 [Urine:625; Drains:100] Intake/Output from this shift: Total I/O In: 5 [Other:5] Out: 200 [Urine:200]  Labs:  Recent Labs  01/21/15 0815 01/22/15 0449 01/23/15 0420  WBC 22.4* 13.0* 12.6*  HGB 13.1 12.6* 13.8  PLT 133* 141* 182  CREATININE 2.01* 2.06* 1.91*   Estimated Creatinine Clearance: 34.4 mL/min (by C-G formula based on Cr of 1.91). No results for input(s): VANCOTROUGH, VANCOPEAK, VANCORANDOM, GENTTROUGH, GENTPEAK, GENTRANDOM, TOBRATROUGH, TOBRAPEAK, TOBRARND, AMIKACINPEAK, AMIKACINTROU, AMIKACIN in the last 72 hours.   Microbiology: Recent Results (from the past 720 hour(s))  Blood culture (routine x 2)     Status: None (Preliminary result)   Collection Time: 01/20/15 11:25 PM  Result Value Ref Range Status   Specimen Description BLOOD RIGHT ARM  Final   Special Requests BOTTLES DRAWN AEROBIC AND ANAEROBIC 5ML  Final   Culture NO GROWTH 2 DAYS  Final   Report Status PENDING  Incomplete  Blood culture (routine x 2)     Status: None (Preliminary result)   Collection Time: 01/20/15 11:30 PM  Result Value Ref Range Status   Specimen Description BLOOD LEFT HAND  Final   Special Requests BOTTLES DRAWN AEROBIC AND ANAEROBIC 5ML  Final   Culture NO GROWTH 2 DAYS  Final   Report Status PENDING  Incomplete  Culture, body fluid-bottle     Status: None   Collection Time: 01/21/15  4:00 PM  Result Value Ref Range Status   Specimen Description FLUID GALL BLADDER  Final   Special Requests BOTTLES DRAWN AEROBIC AND ANAEROBIC  10CC  Final   Gram Stain   Final    GRAM NEGATIVE RODS IN BOTH AEROBIC AND ANAEROBIC BOTTLES CRITICAL RESULT CALLED TO, READ BACK BY AND VERIFIED WITH: Ophelia Charter RN [id number] 01/21/15 A BROWNING    Culture ESCHERICHIA COLI  Final   Report Status 01/23/2015 FINAL  Final   Organism ID, Bacteria ESCHERICHIA COLI  Final      Susceptibility   Escherichia coli - MIC*    AMPICILLIN <=2 SENSITIVE Sensitive     CEFAZOLIN <=4 SENSITIVE Sensitive     CEFEPIME <=1 SENSITIVE Sensitive     CEFTAZIDIME <=1 SENSITIVE Sensitive     CEFTRIAXONE <=1 SENSITIVE Sensitive     CIPROFLOXACIN <=0.25 SENSITIVE Sensitive     GENTAMICIN <=1 SENSITIVE Sensitive     IMIPENEM <=0.25 SENSITIVE Sensitive     TRIMETH/SULFA <=20 SENSITIVE Sensitive     AMPICILLIN/SULBACTAM <=2 SENSITIVE Sensitive     PIP/TAZO <=4 SENSITIVE Sensitive     * ESCHERICHIA COLI  Gram stain     Status: None   Collection Time: 01/21/15  4:00 PM  Result Value Ref Range Status   Specimen Description FLUID GALL BLADDER  Final   Special Requests BOTTLES DRAWN AEROBIC AND ANAEROBIC 10CC  Final   Gram Stain   Final    ABUNDANT WBC PRESENT, PREDOMINANTLY PMN GRAM NEGATIVE RODS Gram Stain Report Called to,Read Back By and Verified With: N BUCK RN [id number] 01/21/15 A BROWNING    Report Status 01/21/2015 FINAL  Final    Medical History:  Past Medical History  Diagnosis Date  . Hyperlipidemia   . Coronary artery disease 2002    CABG x5  . Diabetes mellitus   . Hypercholesteremia   . Mitral valve regurgitation   . Aortic insufficiency   . Skin cancer     tumor removed off of his back  . Acid reflux   . DVT (deep venous thrombosis) (HCC)     previously on coumadin   Assessment: 81 year old male with gallbladder infection now with concern for pneumonia on stress test and clinically worsening SOB to add vancomycin for MRSA coverage and azithromycin for atypical coverage. Continuing Unasyn for Ecoli cholecystitis and anaerobes. Strep and legionella UA  have been sent. MRSA pcr sent. No current sputum production for respiratory culture.   WBC 12.6, sats 87% on 4L [place], SCr 1.91 today (down from 2.06). Est CrCl ~30-20mL/min. UOP recorded is low - unsure how accurate.   Goal of Therapy:  Vancomycin trough level 15-20 mcg/ml  Plan:  Vancomycin 1250 mg IV every 24 hours.  Monitor renal function, culture results, and clinical status.  Follow-up ability to narrow coverage.   Sloan Leiter, PharmD, BCPS Clinical Pharmacist [phone] 01/23/2015,1:45 PM

## 2015-01-24 ENCOUNTER — Encounter: Payer: Self-pay | Admitting: Cardiovascular Disease

## 2015-01-24 ENCOUNTER — Encounter (HOSPITAL_COMMUNITY): Payer: Self-pay | Admitting: General Practice

## 2015-01-24 DIAGNOSIS — Z794 Long term (current) use of insulin: Secondary | ICD-10-CM

## 2015-01-24 LAB — GLUCOSE, CAPILLARY
GLUCOSE-CAPILLARY: 185 mg/dL — AB (ref 65–99)
GLUCOSE-CAPILLARY: 164 mg/dL — AB (ref 65–99)
GLUCOSE-CAPILLARY: 154 mg/dL — AB (ref 65–99)
Glucose-Capillary: 170 mg/dL — ABNORMAL HIGH (ref 65–99)

## 2015-01-24 LAB — CBC
HEMATOCRIT: 37.3 % — AB (ref 39.0–52.0)
HEMOGLOBIN: 12.9 g/dL — AB (ref 13.0–17.0)
MCH: 30.6 pg (ref 26.0–34.0)
MCHC: 34.6 g/dL (ref 30.0–36.0)
MCV: 88.4 fL (ref 78.0–100.0)
Platelets: 229 10*3/uL (ref 150–400)
RBC: 4.22 MIL/uL (ref 4.22–5.81)
RDW: 13.5 % (ref 11.5–15.5)
WBC: 14.1 10*3/uL — ABNORMAL HIGH (ref 4.0–10.5)

## 2015-01-24 LAB — LEGIONELLA ANTIGEN, URINE

## 2015-01-24 MED ORDER — FUROSEMIDE 10 MG/ML IJ SOLN
40.0000 mg | Freq: Once | INTRAMUSCULAR | Status: AC
  Administered 2015-01-24: 40 mg via INTRAVENOUS
  Filled 2015-01-24: qty 4

## 2015-01-24 NOTE — Progress Notes (Signed)
[facility] PROGRESS NOTE  Nivin Salz [id number] DOB: 1933/11/18 DOA: 01/20/2015 PCP: Donnajean Lopes, MD  Brief Summary  [name] is a 81 year old male with a past medical history significant for diabetes mellitus type 2, HLD, CAD s/p CABG; who presented with progressively worsening abdominal pain for 3 days.  In the ER, fever of 101.19F. Initial lab work showed a WBC of 24.4 and lactic acid level of 2.1 CT scan demonstrated acute cholecystitis.  He was started on zosyn.  Surgery recommended conservative management given the patient's CAD/comorbidities.    Assessment/Plan  Sepsis secondary to Acute cholecystitis (fever, leukocytosis, tachypnea), tolerating solids. Biliary culture with pansensitive e coli. Continue Unasyn. -  Cholecystostomy drain placed on 12/6.   Hypoxia: likely secondary to pneumonia and possible component of fluid overload.  Sats better after Lasix dose. Will give another dose today. MRSA PCR negative which is high negative predictive value for MRSA pneumonia. Will discontinue vancomycin. Continue azithromycin. Urine strep antigen and legionella antigen negative. Seems more comfortable today and saturations slightly improved, though still requiring 4 L of oxygen.  Coronary artery disease S/P CABG, was previously asymptomatic with MI: Myoview low risk.    Elevated cardiac enzymes: see above. Doubt ACS. No chest pain.  -  Continue ASA, statin, BB. Discontinue telemetry.    Acute kidney injury versus progression of chronic kidney disease, creatinine 1.5 approximately one year ago -  Creatinine stable around 2. Continue to monitor.  Diabetes mellitus type 2 with complications ([facility]): Last hemoglobin A1c unknown at this time. - Held Actos and 70/30 regimen of insulin - CBGcontrolled  Essential Hypertension, stable  Hypercholesteremia -Continue simvastatin  GERD Continue PPI  Proph:  Heparin   Code Status: full Family Communication: patient is  lucid Disposition Plan:  Eventually home. Has worked with physical therapy who recommended 24-hour supervision. If family unable would need skilled nursing facility.    Consultants:  IR  General surgery  Procedures:  CT abd/pelvis  Perc cholecystostomy drain on 12/6   Antibiotics:  Zosyn 12/5 >    HPI/Subjective: Not much cough. Pain slightly improved. Still short of breath, but improved.  Objective: Filed Vitals:   01/23/15 1333 01/23/15 1954 01/24/15 0405 01/24/15 1117  BP: 143/69 128/69 142/67 127/96  Pulse: 89 77 72 73  Temp:  98.4 F (36.9 C) 98.7 F (37.1 C) 98.2 F (36.8 C)  TempSrc:  Oral Oral Oral  Resp: 20 22 20 20   Height:      Weight:   84.823 kg (187 lb)   SpO2: 87% 90% 89% 91%    Intake/Output Summary (Last 24 hours) at 01/24/15 1159 Last data filed at 01/24/15 1046  Gross per 24 hour  Intake    745 ml  Output   1590 ml  Net   -845 ml   Filed Weights   01/22/15 0413 01/23/15 0415 01/24/15 0405  Weight: 85.957 kg (189 lb 8 oz) 85.322 kg (188 lb 1.6 oz) 84.823 kg (187 lb)   Body mass index is 25.36 kg/(m^2).  Exam:   General:  Alert. in chair. Appears more comfortable. Breathing less labored.   Cardiovascular:  RRR, nl S1, S2 no mrg, 2+ pulses, warm extremities  Respiratory:  still splinting but slightly improved. Diminished at bases. No rhonchi rales or wheeze.   Abdomen:   Nondistended. Bowel sounds present. Right upper quadrant less tender   Extremities: Trace pitting edema  Data Reviewed: Basic Metabolic Panel:  Recent Labs Lab 01/19/15 0033 01/20/15 2034 01/21/15 0815 01/22/15  0449 01/23/15 0420  NA 138 135 135 137 138  K 3.3* 4.2 3.6 3.5 3.5  CL 107 104 107 109 107  CO2 20* 22 20* 20* 18*  GLUCOSE 230* 179* 170* 182* 180*  BUN 23* 26* 27* 30* 31*  CREATININE 1.74* 2.02* 2.01* 2.06* 1.91*  CALCIUM 9.4 9.2 8.1* 8.1* 8.5*  MG  --   --  1.4*  --   --   PHOS  --   --  2.6  --   --    Liver Function Tests:  Recent  Labs Lab 01/19/15 0033 01/20/15 2034 01/21/15 0815 01/22/15 0449  AST 26 31 25 25   ALT 21 20 16* 18  ALKPHOS 75 54 47 40  BILITOT 0.8 2.1* 1.6* 1.5*  PROT 7.9 7.0 6.1* 5.8*  ALBUMIN 3.8 3.1* 2.6* 2.3*    Recent Labs Lab 01/19/15 0033 01/20/15 2034  LIPASE 38 22   No results for input(s): AMMONIA in the last 168 hours. CBC:  Recent Labs Lab 01/20/15 2034 01/21/15 0815 01/22/15 0449 01/23/15 0420 01/24/15 0401  WBC 24.4* 22.4* 13.0* 12.6* 14.1*  NEUTROABS  --  17.5*  --   --   --   HGB 14.5 13.1 12.6* 13.8 12.9*  HCT 42.4 39.0 37.6* 40.8 37.3*  MCV 88.7 90.1 88.9 88.3 88.4  PLT 164 133* 141* 182 229    Recent Results (from the past 240 hour(s))  Blood culture (routine x 2)     Status: None (Preliminary result)   Collection Time: 01/20/15 11:25 PM  Result Value Ref Range Status   Specimen Description BLOOD RIGHT ARM  Final   Special Requests BOTTLES DRAWN AEROBIC AND ANAEROBIC 5ML  Final   Culture NO GROWTH 2 DAYS  Final   Report Status PENDING  Incomplete  Blood culture (routine x 2)     Status: None (Preliminary result)   Collection Time: 01/20/15 11:30 PM  Result Value Ref Range Status   Specimen Description BLOOD LEFT HAND  Final   Special Requests BOTTLES DRAWN AEROBIC AND ANAEROBIC 5ML  Final   Culture NO GROWTH 2 DAYS  Final   Report Status PENDING  Incomplete  Culture, body fluid-bottle     Status: None   Collection Time: 01/21/15  4:00 PM  Result Value Ref Range Status   Specimen Description FLUID GALL BLADDER  Final   Special Requests BOTTLES DRAWN AEROBIC AND ANAEROBIC 10CC  Final   Gram Stain   Final    GRAM NEGATIVE RODS IN BOTH AEROBIC AND ANAEROBIC BOTTLES CRITICAL RESULT CALLED TO, READ BACK BY AND VERIFIED WITH: Ophelia Charter RN [id number] 01/21/15 A BROWNING    Culture ESCHERICHIA COLI  Final   Report Status 01/23/2015 FINAL  Final   Organism ID, Bacteria ESCHERICHIA COLI  Final      Susceptibility   Escherichia coli - MIC*    AMPICILLIN <=2  SENSITIVE Sensitive     CEFAZOLIN <=4 SENSITIVE Sensitive     CEFEPIME <=1 SENSITIVE Sensitive     CEFTAZIDIME <=1 SENSITIVE Sensitive     CEFTRIAXONE <=1 SENSITIVE Sensitive     CIPROFLOXACIN <=0.25 SENSITIVE Sensitive     GENTAMICIN <=1 SENSITIVE Sensitive     IMIPENEM <=0.25 SENSITIVE Sensitive     TRIMETH/SULFA <=20 SENSITIVE Sensitive     AMPICILLIN/SULBACTAM <=2 SENSITIVE Sensitive     PIP/TAZO <=4 SENSITIVE Sensitive     * ESCHERICHIA COLI  Gram stain     Status: None   Collection Time: 01/21/15  4:00 PM  Result Value Ref Range Status   Specimen Description FLUID GALL BLADDER  Final   Special Requests BOTTLES DRAWN AEROBIC AND ANAEROBIC 10CC  Final   Gram Stain   Final    ABUNDANT WBC PRESENT, PREDOMINANTLY PMN GRAM NEGATIVE RODS Gram Stain Report Called to,Read Back By and Verified With: N BUCK RN [id number] 01/21/15 A BROWNING    Report Status 01/21/2015 FINAL  Final  MRSA PCR Screening     Status: None   Collection Time: 01/23/15  1:59 PM  Result Value Ref Range Status   MRSA by PCR NEGATIVE NEGATIVE Final    Comment:        The GeneXpert MRSA Assay (FDA approved for NASAL specimens only), is one component of a comprehensive MRSA colonization surveillance program. It is not intended to diagnose MRSA infection nor to guide or monitor treatment for MRSA infections.      Studies: Dg Chest 2 View  01/23/2015  CLINICAL DATA:  SOB and weakness today; hx CAD, diabetic; best inspiration possible on AP EXAM: CHEST  2 VIEW COMPARISON:  01/22/2015 FINDINGS: Shallow lung inflation. Heart is enlarged. Patchy density at the left lung base partially obscures the hemidiaphragm. Findings consistent with atelectasis or developing infiltrate. There has been some improvement in overall aeration. IMPRESSION: Improved mild edema. Developing left lower lobe atelectasis or infiltrate. Electronically Signed   By: Nolon Nations M.D.   On: 01/23/2015 13:39   Nm Myocar Multi W/spect W/wall  Motion / Ef  01/23/2015   There was no ST segment deviation noted during stress.  The study is normal.  This is a low risk study.  The left ventricular ejection fraction is normal (55-65%).  There is attenuation in basal and mid inferolateral and anterolateral stress and rest images and no evidence of ischemia. Overall LVEF 65%.   Dg Chest Port 1 View  01/22/2015  CLINICAL DATA:  Onset severe shortness of breath today. Hypoxia. Coronary artery disease. EXAM: PORTABLE CHEST 1 VIEW COMPARISON:  01/21/2015 FINDINGS: Very low lung volumes are seen with bibasilar atelectasis. Interstitial prominence is also noted, and mild interstitial edema cannot be excluded. Heart size is stable allowing for decreased lung volumes. Prior CABG noted. IMPRESSION: Very low lung volumes with bibasilar atelectasis. Mild interstitial edema cannot be excluded. Electronically Signed   By: Earle Gell M.D.   On: 01/22/2015 18:43   Echo Left ventricle: The cavity size was normal. There was moderate focal basal hypertrophy of the septum. Systolic function was normal. The estimated ejection fraction was in the range of 55% to 60%. Wall motion was normal; there were no regional wall motion abnormalities. Doppler parameters are consistent with abnormal left ventricular relaxation (grade 1 diastolic dysfunction). - Aortic valve: Trileaflet; mildly thickened, moderately calcified leaflets. There was moderate regurgitation. - Aorta: The aorta was mildly calcified. Aortic root dimension: 41 mm (ED). - Ascending aorta: The ascending aorta was mildly dilated. - Mitral valve: There was mild regurgitation. - Left atrium: The atrium was mildly dilated. - Pulmonary arteries: Systolic pressure was mildly increased. PA peak pressure: 37 mm Hg (S).  Impressions:  - Compared to the prior study, there has been no significant interval change.  Scheduled Meds: . amLODipine  5 mg Oral Daily  .  ampicillin-sulbactam (UNASYN) IV  3 g Intravenous Q6H  . aspirin  81 mg Oral Daily  . atenolol  50 mg Oral BID  . azithromycin  500 mg Intravenous Q24H  . furosemide  40 mg Intravenous  Once  . heparin  5,000 Units Subcutaneous 3 times per day  . insulin aspart  0-9 Units Subcutaneous TID WC  . multivitamin with minerals  1 tablet Oral Daily  . omega-3 acid ethyl esters  1 g Oral Daily  . pantoprazole  40 mg Oral Daily  . simvastatin  20 mg Oral QHS  . sodium chloride  3 mL Intravenous Q12H   Continuous Infusions:    Principal Problem:   Acute cholecystitis Active Problems:   Coronary artery disease   Hypercholesteremia   S/P CABG (coronary artery bypass graft)   Diabetes mellitus type 2 with complications (H[facility])   Sepsis ([facility])   Hypoxia   Elevated troponin   Pneumonia    Time spent: 25 min    [facility] [email or web address], password [email or web address] 01/24/2015, 11:59 AM  LOS: 3 days

## 2015-01-24 NOTE — Care Management Important Message (Signed)
Important Message  Patient Details  Name: Nathan [name] MRN: [id number] Date of Birth: 12/07/1933   Medicare Important Message Given:  Yes    Nathan [name] Nathan [name] 01/24/2015, 1:38 PM

## 2015-01-24 NOTE — Progress Notes (Signed)
[facility] Progress Note    Subjective: Patient doing okay.  Eating breakfast.  Percutaneous drain in place and not draining much.  No bile.  Objective: Vital signs in last 24 hours: Temp:  [98.4 F (36.9 C)-98.7 F (37.1 C)] 98.7 F (37.1 C) (12/09 0405) Pulse Rate:  [72-95] 72 (12/09 0405) Resp:  [20-22] 20 (12/09 0405) BP: (128-194)/(67-96) 142/67 mmHg (12/09 0405) SpO2:  [87 %-90 %] 89 % (12/09 0405) Weight:  [84.823 kg (187 lb)] 84.823 kg (187 lb) (12/09 0405) Last BM Date: 01/21/15  Intake/Output from previous day: 12/08 0701 - 12/09 0700 In: 750 [P.O.:340; IV Piggyback:400] Out: 1490 [Urine:1400; Drains:90] Intake/Output this shift:    General: No acute distress, but still states that he hurts all over in his abdomen when he stakes a deep breath.  Lungs: Clear  Abd: Distended, good bowel sounds.   Blood drainage in Perc drain bag.  Not much bile can be seen.  Extremities: No changes  Neuro: Intact   Lab Results:  @LABLAST2 (wbc:2,hgb:2,hct:2,plt:2) BMET ) Recent Labs  01/22/15 0449 01/23/15 0420  NA 137 138  K 3.5 3.5  CL 109 107  CO2 20* 18*  GLUCOSE 182* 180*  BUN 30* 31*  CREATININE 2.06* 1.91*  CALCIUM 8.1* 8.5*   PT/INR No results for input(s): LABPROT, INR in the last 72 hours. ABG No results for input(s): PHART, HCO3 in the last 72 hours.  Invalid input(s): PCO2, PO2  Studies/Results: Dg Chest 2 View  01/23/2015  CLINICAL DATA:  SOB and weakness today; hx CAD, diabetic; best inspiration possible on AP EXAM: CHEST  2 VIEW COMPARISON:  01/22/2015 FINDINGS: Shallow lung inflation. Heart is enlarged. Patchy density at the left lung base partially obscures the hemidiaphragm. Findings consistent with atelectasis or developing infiltrate. There has been some improvement in overall aeration. IMPRESSION: Improved mild edema. Developing left lower lobe atelectasis or infiltrate. Electronically Signed   By: Nolon Nations M.D.   On: 01/23/2015 13:39    Nm Myocar Multi W/spect W/wall Motion / Ef  01/23/2015   There was no ST segment deviation noted during stress.  The study is normal.  This is a low risk study.  The left ventricular ejection fraction is normal (55-65%).  There is attenuation in basal and mid inferolateral and anterolateral stress and rest images and no evidence of ischemia. Overall LVEF 65%.   Dg Chest Port 1 View  01/22/2015  CLINICAL DATA:  Onset severe shortness of breath today. Hypoxia. Coronary artery disease. EXAM: PORTABLE CHEST 1 VIEW COMPARISON:  01/21/2015 FINDINGS: Very low lung volumes are seen with bibasilar atelectasis. Interstitial prominence is also noted, and mild interstitial edema cannot be excluded. Heart size is stable allowing for decreased lung volumes. Prior CABG noted. IMPRESSION: Very low lung volumes with bibasilar atelectasis. Mild interstitial edema cannot be excluded. Electronically Signed   By: Earle Gell M.D.   On: 01/22/2015 18:43    Anti-infectives: Anti-infectives    Start     Dose/Rate Route Frequency Ordered Stop   01/23/15 1430  azithromycin (ZITHROMAX) 500 mg in dextrose 5 % 250 mL IVPB     500 mg 250 mL/hr over 60 Minutes Intravenous Every 24 hours 01/23/15 1347     01/23/15 1400  vancomycin (VANCOCIN) 1,250 mg in sodium chloride 0.9 % 250 mL IVPB     1,250 mg 166.7 mL/hr over 90 Minutes Intravenous Every 24 hours 01/23/15 1358     01/23/15 1030  Ampicillin-Sulbactam (UNASYN) 3 g in sodium chloride 0.9 %  100 mL IVPB     3 g 100 mL/hr over 60 Minutes Intravenous Every 6 hours 01/23/15 0930     01/21/15 1000  piperacillin-tazobactam (ZOSYN) IVPB 3.375 g  Status:  Discontinued     3.375 g 12.5 mL/hr over 240 Minutes Intravenous Every 8 hours 01/21/15 0544 01/23/15 0930   01/21/15 0730  piperacillin-tazobactam (ZOSYN) IVPB 3.375 g  Status:  Discontinued     3.375 g 100 mL/hr over 30 Minutes Intravenous  Once 01/21/15 0717 01/21/15 0719   01/21/15 0345  piperacillin-tazobactam  (ZOSYN) IVPB 3.375 g     3.375 g 100 mL/hr over 30 Minutes Intravenous  Once 01/21/15 [id number] 01/21/15 0432      Assessment/Plan: s/p  Continue antibiotics for the E.[place]in in his drainage fluid.Will need to be put in the drain clinic for follow up after discharge. Follow up with Dr. Donne Hazel as outpatient for management, possible cholecystectomy  LOS: 3 days   Kathryne Eriksson. Dahlia Bailiff, MD, FACS [phone] [facility] 01/24/2015

## 2015-01-24 NOTE — Discharge Instructions (Signed)
Cholecystostomy Cholecystostomy is a procedure to drain fluid from the gallbladder by using a flexible tube (catheter). The gallbladder is a pear-shaped organ that lies beneath the liver on the right side of the body. The gallbladder stores bile, which is a fluid that helps the body to digest fats. You may have this procedure:  If your gallbladder is swollen or irritated due to bile build up.  To prepare you for gallbladder surgery.  To control your symptoms if you cannot have gallbladder surgery. LET [facility] CARE PROVIDER KNOW ABOUT:   Any allergies you have.  All medicines you are taking, including vitamins, herbs, eye drops, creams, and over-the-counter medicines.  Previous problems you or members of your family have had with the use of anesthetics.  Any blood disorders you have.  Previous surgeries you have had.  Any medical conditions you have.  Whether you are pregnant or may be pregnant. RISKS AND COMPLICATIONS Generally, this is a safe procedure. However, problems may occur, including:  The catheter moving out of place.  Clogging of the catheter.  Infection of the incision site.  Internal bleeding.  Puncture of the gallbladder. This can cause the bile to leak.  Infection inside the abdomen (peritonitis).  Damage to other structures or organs.  Low blood pressure and slowed heart rate.  Allergic reactions to medicines or dyes. BEFORE THE PROCEDURE  You may need to have tests, including:  Imaging studies of your gallbladder.  Blood tests.  Follow instructions from your health care provider about eating or drinking restrictions.  Do not use tobacco products, including cigarettes, chewing tobacco, or e-cigarettes, as told by your health care provider. If you need help quitting, ask your health care provider.  Ask your health care provider about:  Changing or stopping your regular medicines. This is especially important if you are taking diabetes  medicines or blood thinners.  Taking medicines such as aspirin and ibuprofen. These medicines can thin your blood. Do not take these medicines before your procedure if your health care provider instructs you not to.  Plan to have someone take you home after the procedure.  If you go home right after the procedure, plan to have someone with you for 24 hours.  Ask your health care provider how your surgical site will be marked or identified.  You may be given antibiotic medicine to help prevent infection. PROCEDURE  To reduce your risk of infection:  Your health care team will wash or sanitize their hands.  Your skin will be washed with soap.  An IV tube will be inserted into one of your veins.  You will be given one or more of the following:  A medicine to help you relax (sedative).  A medicine to numb the area (local anesthetic).  A small incision will be made in your abdomen.  A long needle or a wide puncturing tool (trocar) will be put through the incision.  Your health care provider will use an imaging study (ultrasound) to guide the needle or trocar into your gallbladder.  After the needle or trocar is in your gallbladder, a small amount of dye may be injected. An X-ray may be taken to make sure that the needle or trocar is in the correct place.  A catheter will be placed through the needle or trocar.  The catheter will be secured to your skin with stitches (sutures).  The catheter will be connected to a drainage bag. Fluid will drain from the gallbladder into the bag. Some  of this bile may be sent to the lab to be examined.  A bandage (dressing) will be placed over the incision. The procedure may vary among health care providers and hospitals. AFTER THE PROCEDURE  Your blood pressure, heart rate, breathing rate, and blood oxygen level will be monitored often until the medicines you were given have worn off.  Dye may be injected through your catheter to check the  catheter and your gallbladder.  Your gallbladder may be flushed out (irrigated) through the catheter.  Your catheter and drainage bag may need to stay in place for several weeks or as told by your health care provider.   This information is not intended to replace advice given to you by your health care provider. Make sure you discuss any questions you have with your health care provider.   Document Released: 04/30/2008 Document Revised: 10/23/2014 Document Reviewed: 05/15/2014 Elsevier Interactive Patient Education [employer].

## 2015-01-24 NOTE — Evaluation (Signed)
Physical Therapy Evaluation Patient Details Name: Nathan [name] MRN: [id number] DOB: [birth year] Today's Date: 01/24/2015   History of Present Illness  Nathan [name] is a 81 year old male with a past medical history significant for diabetes mellitus type 2, HLD, CAD s/p CABG; who presented with progressively worsening abdominal pain for 3 days. In the ER, fever of 101.42F. Initial lab work showed a WBC of 24.4 and lactic acid level of 2.1 CT scan demonstrated acute cholecystitis. He was started on zosyn. Surgery recommended conservative management given the patient's CAD/comorbidities    Clinical Impression  Pt admitted with above diagnosis. Pt currently with functional limitations due to the deficits listed below (see PT Problem List). Pt currently requires steady assist overall without AD and supervision for management for O2, drain and IV pole. Pt will benefit from skilled PT to increase their independence and safety with mobility to allow discharge. Pt strongly wants to d/c home but would benefit from 24/7 supervision for overall safety and assist with household management due to possible new O2 tubing management which increases fall risk and pt also with new drain. Recommended pt to talk with family and neighbor to attempt to provide this initially if possible.      Follow Up Recommendations Supervision/Assistance - 24 hour (If family unable, recommend short term rehab)    Equipment Recommendations   (may trial SPC in future session to aid with balance/enduranc)    Recommendations for Other Services OT consult     Precautions / Restrictions Precautions Precautions: Fall Precaution Comments: drain on R; 5L O2 Restrictions Weight Bearing Restrictions: No      Mobility  Bed Mobility Overal bed mobility: Needs Assistance Bed Mobility: Supine to Sit     Supine to sit: Supervision     General bed mobility comments: set-up assist needed for management of O2, IV, and  drain  Transfers Overall transfer level: Needs assistance Equipment used: None Transfers: Sit to/from Stand Sit to Stand: Min guard         General transfer comment: steady assist initially due to slight dizziness but resolved  Ambulation/Gait Ambulation/Gait assistance: Min guard Ambulation Distance (Feet): 100 Feet Assistive device: None Gait Pattern/deviations: Step-through pattern;Decreased stride length     General Gait Details: slightly unsteady gait with light min assist during turns  Science writer    Modified Rankin (Stroke Patients Only)       Balance Overall balance assessment: Needs assistance Sitting-balance support: No upper extremity supported;Feet supported Sitting balance-[name]: Good     Standing balance support: Single extremity supported;No upper extremity supported;During functional activity Standing balance-[name]: Fair                               Pertinent Vitals/Pain Pain Assessment: 0-10 Pain Score: 3  Pain Location: abdomen Pain Descriptors / Indicators: Sore Pain Intervention(s): Monitored during session;Repositioned    Home Living Family/patient expects to be discharged to:: Private residence Living Arrangements: Alone Available Help at Discharge: Available PRN/intermittently;Neighbor;Family Type of Home: Mobile home Home Access: Stairs to enter Entrance Stairs-Rails: None Entrance Stairs-Number of Steps: 1 Home Layout: One level        Prior Function Level of Independence: Independent         Comments: Pt reports he would go out daily (ate all meals out) for breakfast, golf course, etc     Hand Dominance  Extremity/Trunk Assessment   Upper Extremity Assessment: Defer to OT evaluation           Lower Extremity Assessment: Generalized weakness      Cervical / Trunk Assessment: Kyphotic  Communication   Communication: No difficulties  Cognition  Arousal/Alertness: Awake/alert Behavior During Therapy: WFL for tasks assessed/performed Overall Cognitive Status: Within Functional Limits for tasks assessed                      General Comments General comments (skin integrity, edema, etc.): PT on 5-6 L during session. At rest 87-89% and with activity maintained 86-92%. Upon return to room at 91% on 5 L. Educated on possibility of needing to d/c home with O2 and how this can increase fall risk. Pt confident that he will not need to d/c home with O2    Exercises        Assessment/Plan    PT Assessment Patient needs continued PT services  PT Diagnosis Difficulty walking;Generalized weakness;Acute pain   PT Problem List Decreased strength;Decreased activity tolerance;Decreased balance;Decreased mobility;Decreased knowledge of use of DME;Cardiopulmonary status limiting activity;Pain  PT Treatment Interventions DME instruction;Gait training;Stair training;Functional mobility training;Therapeutic activities;Therapeutic exercise;Balance training;Neuromuscular re-education;Patient/family education   PT Goals (Current goals can be found in the Care Plan section) Acute Rehab PT Goals Patient Stated Goal: go home  PT Goal Formulation: With patient Time For Goal Achievement: 02/07/15 Potential to Achieve Goals: Good    Frequency Min 3X/week   Barriers to discharge Decreased caregiver support PT lives alone and reports only having intermittent assist from a neighbor available. When pressed about having someone stay with him initially, he reports maybe his daughter could stay with him for a day or two. Discussed option for short term rehab but pt really wants to d/c straight home repeating "If I could go home for 2 hours and shower, I would be doing much better."  Recommend 24/7 supervision initially (especially if going home with O2 which is new for patient) or short term SNF stay if agreeable.    Co-evaluation                End of Session Equipment Utilized During Treatment: Gait belt;Oxygen Activity Tolerance: Patient tolerated treatment well Patient left: in chair;with call bell/phone within reach Nurse Communication: Mobility status         Time: 1000-1022 PT Time Calculation (min) (ACUTE ONLY): 22 min   Charges:   PT Evaluation $Initial PT Evaluation Tier I: 1 Procedure PT Treatments $Gait Training: 8-22 mins   PT G Codes:        Canary Brim Ivory Broad, PT, DPT Pager #: [phone]  01/24/2015, 10:41 AM

## 2015-01-25 DIAGNOSIS — K810 Acute cholecystitis: Secondary | ICD-10-CM

## 2015-01-25 LAB — GLUCOSE, CAPILLARY
GLUCOSE-CAPILLARY: 218 mg/dL — AB (ref 65–99)
GLUCOSE-CAPILLARY: 215 mg/dL — AB (ref 65–99)
Glucose-Capillary: 165 mg/dL — ABNORMAL HIGH (ref 65–99)
Glucose-Capillary: 194 mg/dL — ABNORMAL HIGH (ref 65–99)

## 2015-01-25 MED ORDER — ONDANSETRON HCL 4 MG/2ML IJ SOLN
4.0000 mg | Freq: Four times a day (QID) | INTRAMUSCULAR | Status: DC | PRN
  Administered 2015-01-25: 4 mg via INTRAVENOUS
  Filled 2015-01-25: qty 2

## 2015-01-25 NOTE — Progress Notes (Signed)
[facility] PROGRESS NOTE  Nathan [name] [id number] DOB: 04-03-1933 DOA: 01/20/2015 PCP: Donnajean Lopes, MD  Brief Summary  Nathan [name] is a 81 year old male with a past medical history significant for diabetes mellitus type 2, HLD, CAD s/p CABG; who presented with progressively worsening abdominal pain for 3 days.  In the ER, fever of 101.6F. Initial lab work showed a WBC of 24.4 and lactic acid level of 2.1 CT scan demonstrated acute cholecystitis.  He was started on zosyn.  Surgery recommended conservative management given the patient's CAD/comorbidities.    Assessment/Plan  Sepsis secondary to Acute cholecystitis (fever, leukocytosis, tachypnea),  Biliary culture with pansensitive e coli. Continue Unasyn. Had vomiting this morning. If occurs again, will need to downgrade diet. -  Cholecystostomy drain placed on 12/6.   Hypoxia: likely secondary to pneumonia and possible component of fluid overload.  Status post 2 doses of Lasix. Improving but still requiring nasal cannula oxygen. Continue azithromycin and Unasyn.  Coronary artery disease S/P CABG,Myoview low risk.    Elevated cardiac enzymes: see above. Doubt ACS. No chest pain.  -  Continue ASA, statin, BB. Discontinue telemetry.   Acute kidney injury versus progression of chronic kidney disease, creatinine 1.5 approximately one year ago -  Creatinine stable around 2. Continue to monitor.  Diabetes mellitus type 2 with complications ([place]): Last hemoglobin A1c unknown at this time. - Held Actos and 70/30 regimen of insulin - CBGcontrolled  Essential Hypertension, stable  Hypercholesteremia -Continue simvastatin  GERD Continue PPI  Proph:  Heparin   Code Status: full Family Communication: patient is lucid Disposition Plan:  Eventually home. Has worked with physical therapy who recommended 24-hour supervision. If family unable would need skilled nursing facility.    Consultants:  IR  General  surgery  Procedures:  CT abd/pelvis  Perc cholecystostomy drain on 12/6   Antibiotics:  Zosyn 12/5 >    HPI/Subjective: Vomited this morning. Feels a bit better now. Passing gas. No bowel movement for 3 days. Dyspnea improved.  Objective: Filed Vitals:   01/24/15 0405 01/24/15 1117 01/24/15 2105 01/25/15 0637  BP: 142/67 127/96 143/67 143/76  Pulse: 72 73 71 77  Temp: 98.7 F (37.1 C) 98.2 F (36.8 C) 98.8 F (37.1 C) 98.8 F (37.1 C)  TempSrc: Oral Oral Oral Oral  Resp: 20 20 22 26   Height:      Weight: 84.823 kg (187 lb)   85.458 kg (188 lb 6.4 oz)  SpO2: 89% 91% 89% 92%    Intake/Output Summary (Last 24 hours) at 01/25/15 1112 Last data filed at 01/25/15 0904  Gross per 24 hour  Intake   1100 ml  Output   1150 ml  Net    -50 ml   Filed Weights   01/23/15 0415 01/24/15 0405 01/25/15 0637  Weight: 85.322 kg (188 lb 1.6 oz) 84.823 kg (187 lb) 85.458 kg (188 lb 6.4 oz)   Body mass index is 25.55 kg/(m^2).  Exam:   General:   asleep. Arousable and appropriate. Slightly groggy after Zofran.  Cardiovascular:  RRR, nl S1, S2 no mrg, 2+ pulses, warm extremities  Respiratory:  clear to auscultation bilaterally without wheezes rhonchi or rales   Abdomen:   slightly distended. Bowel sounds present. Right upper quadrant less tender   Extremities: Trace pitting edema  Data Reviewed: Basic Metabolic Panel:  Recent Labs Lab 01/19/15 0033 01/20/15 2034 01/21/15 0815 01/22/15 0449 01/23/15 0420  NA 138 135 135 137 138  K 3.3* 4.2 3.6 3.5 3.5  CL 107 104 107 109 107  CO2 20* 22 20* 20* 18*  GLUCOSE 230* 179* 170* 182* 180*  BUN 23* 26* 27* 30* 31*  CREATININE 1.74* 2.02* 2.01* 2.06* 1.91*  CALCIUM 9.4 9.2 8.1* 8.1* 8.5*  MG  --   --  1.4*  --   --   PHOS  --   --  2.6  --   --    Liver Function Tests:  Recent Labs Lab 01/19/15 0033 01/20/15 2034 01/21/15 0815 01/22/15 0449  AST 26 31 25 25   ALT 21 20 16* 18  ALKPHOS 75 54 47 40  BILITOT 0.8  2.1* 1.6* 1.5*  PROT 7.9 7.0 6.1* 5.8*  ALBUMIN 3.8 3.1* 2.6* 2.3*    Recent Labs Lab 01/19/15 0033 01/20/15 2034  LIPASE 38 22   No results for input(s): AMMONIA in the last 168 hours. CBC:  Recent Labs Lab 01/20/15 2034 01/21/15 0815 01/22/15 0449 01/23/15 0420 01/24/15 0401  WBC 24.4* 22.4* 13.0* 12.6* 14.1*  NEUTROABS  --  17.5*  --   --   --   HGB 14.5 13.1 12.6* 13.8 12.9*  HCT 42.4 39.0 37.6* 40.8 37.3*  MCV 88.7 90.1 88.9 88.3 88.4  PLT 164 133* 141* 182 229    Recent Results (from the past 240 hour(s))  Blood culture (routine x 2)     Status: None (Preliminary result)   Collection Time: 01/20/15 11:25 PM  Result Value Ref Range Status   Specimen Description BLOOD RIGHT ARM  Final   Special Requests BOTTLES DRAWN AEROBIC AND ANAEROBIC 5ML  Final   Culture NO GROWTH 3 DAYS  Final   Report Status PENDING  Incomplete  Blood culture (routine x 2)     Status: None (Preliminary result)   Collection Time: 01/20/15 11:30 PM  Result Value Ref Range Status   Specimen Description BLOOD LEFT HAND  Final   Special Requests BOTTLES DRAWN AEROBIC AND ANAEROBIC 5ML  Final   Culture NO GROWTH 3 DAYS  Final   Report Status PENDING  Incomplete  Culture, body fluid-bottle     Status: None   Collection Time: 01/21/15  4:00 PM  Result Value Ref Range Status   Specimen Description FLUID GALL BLADDER  Final   Special Requests BOTTLES DRAWN AEROBIC AND ANAEROBIC 10CC  Final   Gram Stain   Final    GRAM NEGATIVE RODS IN BOTH AEROBIC AND ANAEROBIC BOTTLES CRITICAL RESULT CALLED TO, READ BACK BY AND VERIFIED WITH: Ophelia Charter RN [id number] 01/21/15 A BROWNING    Culture ESCHERICHIA COLI  Final   Report Status 01/23/2015 FINAL  Final   Organism ID, Bacteria ESCHERICHIA COLI  Final      Susceptibility   Escherichia coli - MIC*    AMPICILLIN <=2 SENSITIVE Sensitive     CEFAZOLIN <=4 SENSITIVE Sensitive     CEFEPIME <=1 SENSITIVE Sensitive     CEFTAZIDIME <=1 SENSITIVE Sensitive      CEFTRIAXONE <=1 SENSITIVE Sensitive     CIPROFLOXACIN <=0.25 SENSITIVE Sensitive     GENTAMICIN <=1 SENSITIVE Sensitive     IMIPENEM <=0.25 SENSITIVE Sensitive     TRIMETH/SULFA <=20 SENSITIVE Sensitive     AMPICILLIN/SULBACTAM <=2 SENSITIVE Sensitive     PIP/TAZO <=4 SENSITIVE Sensitive     * ESCHERICHIA COLI  Gram stain     Status: None   Collection Time: 01/21/15  4:00 PM  Result Value Ref Range Status   Specimen Description FLUID GALL BLADDER  Final  Special Requests BOTTLES DRAWN AEROBIC AND ANAEROBIC 10CC  Final   Gram Stain   Final    ABUNDANT WBC PRESENT, PREDOMINANTLY PMN GRAM NEGATIVE RODS Gram Stain Report Called to,Read Back By and Verified With: N BUCK RN [id number] 01/21/15 A BROWNING    Report Status 01/21/2015 FINAL  Final  MRSA PCR Screening     Status: None   Collection Time: 01/23/15  1:59 PM  Result Value Ref Range Status   MRSA by PCR NEGATIVE NEGATIVE Final    Comment:        The GeneXpert MRSA Assay (FDA approved for NASAL specimens only), is one component of a comprehensive MRSA colonization surveillance program. It is not intended to diagnose MRSA infection nor to guide or monitor treatment for MRSA infections.      Studies: Dg Chest 2 View  01/23/2015  CLINICAL DATA:  SOB and weakness today; hx CAD, diabetic; best inspiration possible on AP EXAM: CHEST  2 VIEW COMPARISON:  01/22/2015 FINDINGS: Shallow lung inflation. Heart is enlarged. Patchy density at the left lung base partially obscures the hemidiaphragm. Findings consistent with atelectasis or developing infiltrate. There has been some improvement in overall aeration. IMPRESSION: Improved mild edema. Developing left lower lobe atelectasis or infiltrate. Electronically Signed   By: Nolon Nations M.D.   On: 01/23/2015 13:39   Nm Myocar Multi W/spect W/wall Motion / Ef  01/23/2015   There was no ST segment deviation noted during stress.  The study is normal.  This is a low risk study.  The left  ventricular ejection fraction is normal (55-65%).  There is attenuation in basal and mid inferolateral and anterolateral stress and rest images and no evidence of ischemia. Overall LVEF 65%.   Echo Left ventricle: The cavity size was normal. There was moderate focal basal hypertrophy of the septum. Systolic function was normal. The estimated ejection fraction was in the range of 55% to 60%. Wall motion was normal; there were no regional wall motion abnormalities. Doppler parameters are consistent with abnormal left ventricular relaxation (grade 1 diastolic dysfunction). - Aortic valve: Trileaflet; mildly thickened, moderately calcified leaflets. There was moderate regurgitation. - Aorta: The aorta was mildly calcified. Aortic root dimension: 41 mm (ED). - Ascending aorta: The ascending aorta was mildly dilated. - Mitral valve: There was mild regurgitation. - Left atrium: The atrium was mildly dilated. - Pulmonary arteries: Systolic pressure was mildly increased. PA peak pressure: 37 mm Hg (S).  Impressions:  - Compared to the prior study, there has been no significant interval change.  Scheduled Meds: . amLODipine  5 mg Oral Daily  . ampicillin-sulbactam (UNASYN) IV  3 g Intravenous Q6H  . aspirin  81 mg Oral Daily  . atenolol  50 mg Oral BID  . azithromycin  500 mg Intravenous Q24H  . heparin  5,000 Units Subcutaneous 3 times per day  . insulin aspart  0-9 Units Subcutaneous TID WC  . multivitamin with minerals  1 tablet Oral Daily  . omega-3 acid ethyl esters  1 g Oral Daily  . pantoprazole  40 mg Oral Daily  . simvastatin  20 mg Oral QHS  . sodium chloride  3 mL Intravenous Q12H   Continuous Infusions:    Principal Problem:   Acute cholecystitis Active Problems:   Coronary artery disease   Hypercholesteremia   S/P CABG (coronary artery bypass graft)   Diabetes mellitus type 2 with complications ([facility])   Sepsis ([facility])   Hypoxia   Elevated  troponin  Pneumonia    Time spent: 25 min  [facility] [email or web address], password [email or web address] 01/25/2015, 11:12 AM  LOS: 4 days

## 2015-01-26 ENCOUNTER — Inpatient Hospital Stay (HOSPITAL_COMMUNITY): Payer: PPO

## 2015-01-26 LAB — COMPREHENSIVE METABOLIC PANEL
ALBUMIN: 2 g/dL — AB (ref 3.5–5.0)
ALK PHOS: 46 U/L (ref 38–126)
ALT: 25 U/L (ref 17–63)
ANION GAP: 10 (ref 5–15)
AST: 30 U/L (ref 15–41)
BILIRUBIN TOTAL: 0.7 mg/dL (ref 0.3–1.2)
BUN: 30 mg/dL — ABNORMAL HIGH (ref 6–20)
CALCIUM: 8.7 mg/dL — AB (ref 8.9–10.3)
CO2: 26 mmol/L (ref 22–32)
Chloride: 104 mmol/L (ref 101–111)
Creatinine, Ser: 1.83 mg/dL — ABNORMAL HIGH (ref 0.61–1.24)
GFR, EST AFRICAN AMERICAN: 39 mL/min — AB (ref >60)
GFR, EST NON AFRICAN AMERICAN: 33 mL/min — AB (ref >60)
Glucose, Bld: 186 mg/dL — ABNORMAL HIGH (ref 65–99)
POTASSIUM: 3 mmol/L — AB (ref 3.5–5.1)
Sodium: 140 mmol/L (ref 135–145)
TOTAL PROTEIN: 5.9 g/dL — AB (ref 6.5–8.1)

## 2015-01-26 LAB — GLUCOSE, CAPILLARY
GLUCOSE-CAPILLARY: 184 mg/dL — AB (ref 65–99)
GLUCOSE-CAPILLARY: 212 mg/dL — AB (ref 65–99)
GLUCOSE-CAPILLARY: 158 mg/dL — AB (ref 65–99)
Glucose-Capillary: 142 mg/dL — ABNORMAL HIGH (ref 65–99)

## 2015-01-26 LAB — CULTURE, BLOOD (ROUTINE X 2)
Culture: NO GROWTH
Culture: NO GROWTH

## 2015-01-26 LAB — CBC WITH DIFFERENTIAL/PLATELET
BASOS ABS: 0 10*3/uL (ref 0.0–0.1)
Basophils Relative: 0 %
EOS PCT: 5 %
Eosinophils Absolute: 0.7 10*3/uL (ref 0.0–0.7)
HEMATOCRIT: 37.3 % — AB (ref 39.0–52.0)
HEMOGLOBIN: 12.7 g/dL — AB (ref 13.0–17.0)
LYMPHS ABS: 2.6 10*3/uL (ref 0.7–4.0)
Lymphocytes Relative: 18 %
MCH: 30.1 pg (ref 26.0–34.0)
MCHC: 34 g/dL (ref 30.0–36.0)
MCV: 88.4 fL (ref 78.0–100.0)
MONO ABS: 1.5 10*3/uL — AB (ref 0.1–1.0)
MONOS PCT: 10 %
NEUTROS ABS: 9.9 10*3/uL — AB (ref 1.7–7.7)
Neutrophils Relative %: 67 %
Platelets: 282 10*3/uL (ref 150–400)
RBC: 4.22 MIL/uL (ref 4.22–5.81)
RDW: 13.1 % (ref 11.5–15.5)
WBC: 14.7 10*3/uL — AB (ref 4.0–10.5)

## 2015-01-26 LAB — MAGNESIUM: MAGNESIUM: 2 mg/dL (ref 1.7–2.4)

## 2015-01-26 MED ORDER — POTASSIUM CHLORIDE CRYS ER 20 MEQ PO TBCR
40.0000 meq | EXTENDED_RELEASE_TABLET | Freq: Two times a day (BID) | ORAL | Status: AC
  Administered 2015-01-26 – 2015-01-27 (×3): 40 meq via ORAL
  Filled 2015-01-26 (×3): qty 2

## 2015-01-26 MED ORDER — AZITHROMYCIN 500 MG PO TABS
500.0000 mg | ORAL_TABLET | Freq: Every day | ORAL | Status: DC
  Administered 2015-01-26 – 2015-01-27 (×2): 500 mg via ORAL
  Filled 2015-01-26 (×2): qty 1

## 2015-01-26 MED ORDER — ALBUTEROL SULFATE (2.5 MG/3ML) 0.083% IN NEBU
2.5000 mg | INHALATION_SOLUTION | Freq: Four times a day (QID) | RESPIRATORY_TRACT | Status: DC
  Administered 2015-01-26 – 2015-01-27 (×4): 2.5 mg via RESPIRATORY_TRACT
  Filled 2015-01-26 (×4): qty 3

## 2015-01-26 MED ORDER — TECHNETIUM TC 99M DIETHYLENETRIAME-PENTAACETIC ACID: 32.7000 | Freq: Once | INTRAVENOUS | Status: DC | PRN

## 2015-01-26 MED ORDER — FUROSEMIDE 10 MG/ML IJ SOLN
40.0000 mg | Freq: Once | INTRAMUSCULAR | Status: AC
  Administered 2015-01-26: 40 mg via INTRAVENOUS
  Filled 2015-01-26: qty 4

## 2015-01-26 MED ORDER — TECHNETIUM TO 99M ALBUMIN AGGREGATED
4.3500 | Freq: Once | INTRAVENOUS | Status: AC | PRN
  Administered 2015-01-26: 4 via INTRAVENOUS

## 2015-01-26 NOTE — Progress Notes (Signed)
[facility] PROGRESS NOTE  Nathan [name] [id number] DOB: February 24, 1933 DOA: 01/20/2015 PCP: Donnajean Lopes, MD  Brief Summary  Nathan [name] is a 81 year old male with a past medical history significant for diabetes mellitus type 2, HLD, CAD s/p CABG; who presented with progressively worsening abdominal pain for 3 days.  In the ER, fever of 101.1F. Initial lab work showed a WBC of 24.4 and lactic acid level of 2.1 CT scan demonstrated acute cholecystitis.  He was started on zosyn.  Surgery recommended conservative management given the patient's CAD/comorbidities.    Assessment/Plan  Sepsis secondary to Acute cholecystitis (fever, leukocytosis, tachypnea),  Biliary culture with pansensitive e coli. Continue Unasyn. Vomiting resolved. -  Cholecystostomy drain placed on 12/6.   Hypoxia: likely secondary to pneumonia and possible component of fluid overload.  Status post 2 doses of Lasix. Will give another dose. Should be improving by now. Will check VQ scan to rule out PE. Needs to get out of bed and mobilize. Have ordered out of bed every shift. Continue physical therapy.  Coronary artery disease S/P CABG,Myoview low risk.    Elevated cardiac enzymes: see above. Doubt ACS. No chest pain.  -  Continue ASA, statin, BB. Discontinue telemetry.   Acute kidney injury versus progression of chronic kidney disease, creatinine 1.5 approximately one year ago Creatinine 1.8 today. Improving.  Diabetes mellitus type 2 with complications ([place]): Last hemoglobin A1c unknown at this time. - Held Actos and 70/30 regimen of insulin - CBGcontrolled  Essential Hypertension, stable  Hypercholesteremia -Continue simvastatin  GERD Continue PPI  Hypokalemia: Replete by mouth. Check magnesium level.  Proph:  Heparin   Code Status: full Family Communication: patient is lucid Disposition Plan:  Eventually home. Has worked with physical therapy who recommended 24-hour supervision. If family unable  would need skilled nursing facility.    Consultants:  IR  General surgery  Procedures:  CT abd/pelvis  Perc cholecystostomy drain on 12/6   Antibiotics:  Zosyn 12/5 >    HPI/Subjective: No further vomiting. Ate about half of his breakfast. Still complaining of dyspnea. Occasional cough. Pain improving. Has not been out of bed since Friday.  Objective: Filed Vitals:   01/25/15 0637 01/25/15 1423 01/25/15 2033 01/26/15 0532  BP: 143/76 127/64 148/64 140/64  Pulse: 77 70 71 66  Temp: 98.8 F (37.1 C) 98.7 F (37.1 C) 98.6 F (37 C) 98.6 F (37 C)  TempSrc: Oral Oral Oral Oral  Resp: 26 20 20 20   Height:      Weight: 85.458 kg (188 lb 6.4 oz)   89.132 kg (196 lb 8 oz)  SpO2: 92% 89% 90% 90%    Intake/Output Summary (Last 24 hours) at 01/26/15 1113 Last data filed at 01/26/15 0929  Gross per 24 hour  Intake   2410 ml  Output   1180 ml  Net   1230 ml   Filed Weights   01/24/15 0405 01/25/15 0637 01/26/15 0532  Weight: 84.823 kg (187 lb) 85.458 kg (188 lb 6.4 oz) 89.132 kg (196 lb 8 oz)   Body mass index is 26.64 kg/(m^2).  Exam:   General:   asleep. Arousable. Oriented. Appears slightly winded with talking.  Cardiovascular:  RRR, nl S1, S2 no mrg, 2+ pulses, warm extremities  Respiratory:  clear to auscultation bilaterally without wheezes rhonchi or rales   Abdomen:   slightly distended. Bowel sounds present. Right upper quadrant less tender   Extremities: Trace pitting edema  Data Reviewed: Basic Metabolic Panel:  Recent Labs Lab  01/20/15 2034 01/21/15 0815 01/22/15 0449 01/23/15 0420 01/26/15 0310  NA 135 135 137 138 140  K 4.2 3.6 3.5 3.5 3.0*  CL 104 107 109 107 104  CO2 22 20* 20* 18* 26  GLUCOSE 179* 170* 182* 180* 186*  BUN 26* 27* 30* 31* 30*  CREATININE 2.02* 2.01* 2.06* 1.91* 1.83*  CALCIUM 9.2 8.1* 8.1* 8.5* 8.7*  MG  --  1.4*  --   --   --   PHOS  --  2.6  --   --   --    Liver Function Tests:  Recent Labs Lab  01/20/15 2034 01/21/15 0815 01/22/15 0449 01/26/15 0310  AST 31 25 25 30   ALT 20 16* 18 25  ALKPHOS 54 47 40 46  BILITOT 2.1* 1.6* 1.5* 0.7  PROT 7.0 6.1* 5.8* 5.9*  ALBUMIN 3.1* 2.6* 2.3* 2.0*    Recent Labs Lab 01/20/15 2034  LIPASE 22   No results for input(s): AMMONIA in the last 168 hours. CBC:  Recent Labs Lab 01/21/15 0815 01/22/15 0449 01/23/15 0420 01/24/15 0401 01/26/15 0310  WBC 22.4* 13.0* 12.6* 14.1* 14.7*  NEUTROABS 17.5*  --   --   --  9.9*  HGB 13.1 12.6* 13.8 12.9* 12.7*  HCT 39.0 37.6* 40.8 37.3* 37.3*  MCV 90.1 88.9 88.3 88.4 88.4  PLT 133* 141* 182 229 282    Recent Results (from the past 240 hour(s))  Blood culture (routine x 2)     Status: None   Collection Time: 01/20/15 11:25 PM  Result Value Ref Range Status   Specimen Description BLOOD RIGHT ARM  Final   Special Requests BOTTLES DRAWN AEROBIC AND ANAEROBIC 5ML  Final   Culture NO GROWTH 5 DAYS  Final   Report Status 01/26/2015 FINAL  Final  Blood culture (routine x 2)     Status: None   Collection Time: 01/20/15 11:30 PM  Result Value Ref Range Status   Specimen Description BLOOD LEFT HAND  Final   Special Requests BOTTLES DRAWN AEROBIC AND ANAEROBIC 5ML  Final   Culture NO GROWTH 5 DAYS  Final   Report Status 01/26/2015 FINAL  Final  Culture, body fluid-bottle     Status: None   Collection Time: 01/21/15  4:00 PM  Result Value Ref Range Status   Specimen Description FLUID GALL BLADDER  Final   Special Requests BOTTLES DRAWN AEROBIC AND ANAEROBIC 10CC  Final   Gram Stain   Final    GRAM NEGATIVE RODS IN BOTH AEROBIC AND ANAEROBIC BOTTLES CRITICAL RESULT CALLED TO, READ BACK BY AND VERIFIED WITH: Ophelia Charter RN [id number] 01/21/15 A BROWNING    Culture ESCHERICHIA COLI  Final   Report Status 01/23/2015 FINAL  Final   Organism ID, Bacteria ESCHERICHIA COLI  Final      Susceptibility   Escherichia coli - MIC*    AMPICILLIN <=2 SENSITIVE Sensitive     CEFAZOLIN <=4 SENSITIVE Sensitive      CEFEPIME <=1 SENSITIVE Sensitive     CEFTAZIDIME <=1 SENSITIVE Sensitive     CEFTRIAXONE <=1 SENSITIVE Sensitive     CIPROFLOXACIN <=0.25 SENSITIVE Sensitive     GENTAMICIN <=1 SENSITIVE Sensitive     IMIPENEM <=0.25 SENSITIVE Sensitive     TRIMETH/SULFA <=20 SENSITIVE Sensitive     AMPICILLIN/SULBACTAM <=2 SENSITIVE Sensitive     PIP/TAZO <=4 SENSITIVE Sensitive     * ESCHERICHIA COLI  Gram stain     Status: None   Collection Time: 01/21/15  4:00 PM  Result Value Ref Range Status   Specimen Description FLUID GALL BLADDER  Final   Special Requests BOTTLES DRAWN AEROBIC AND ANAEROBIC 10CC  Final   Gram Stain   Final    ABUNDANT WBC PRESENT, PREDOMINANTLY PMN GRAM NEGATIVE RODS Gram Stain Report Called to,Read Back By and Verified With: N BUCK RN [id number] 01/21/15 A BROWNING    Report Status 01/21/2015 FINAL  Final  MRSA PCR Screening     Status: None   Collection Time: 01/23/15  1:59 PM  Result Value Ref Range Status   MRSA by PCR NEGATIVE NEGATIVE Final    Comment:        The GeneXpert MRSA Assay (FDA approved for NASAL specimens only), is one component of a comprehensive MRSA colonization surveillance program. It is not intended to diagnose MRSA infection nor to guide or monitor treatment for MRSA infections.      Studies: No results found. Echo Left ventricle: The cavity size was normal. There was moderate focal basal hypertrophy of the septum. Systolic function was normal. The estimated ejection fraction was in the range of 55% to 60%. Wall motion was normal; there were no regional wall motion abnormalities. Doppler parameters are consistent with abnormal left ventricular relaxation (grade 1 diastolic dysfunction). - Aortic valve: Trileaflet; mildly thickened, moderately calcified leaflets. There was moderate regurgitation. - Aorta: The aorta was mildly calcified. Aortic root dimension: 41 mm (ED). - Ascending aorta: The ascending aorta was mildly  dilated. - Mitral valve: There was mild regurgitation. - Left atrium: The atrium was mildly dilated. - Pulmonary arteries: Systolic pressure was mildly increased. PA peak pressure: 37 mm Hg (S).  Impressions:  - Compared to the prior study, there has been no significant interval change.  Scheduled Meds: . amLODipine  5 mg Oral Daily  . ampicillin-sulbactam (UNASYN) IV  3 g Intravenous Q6H  . aspirin  81 mg Oral Daily  . atenolol  50 mg Oral BID  . azithromycin  500 mg Oral Daily  . furosemide  40 mg Intravenous Once  . heparin  5,000 Units Subcutaneous 3 times per day  . insulin aspart  0-9 Units Subcutaneous TID WC  . omega-3 acid ethyl esters  1 g Oral Daily  . pantoprazole  40 mg Oral Daily  . simvastatin  20 mg Oral QHS  . sodium chloride  3 mL Intravenous Q12H   Continuous Infusions:    Principal Problem:   Acute cholecystitis Active Problems:   Coronary artery disease   Hypercholesteremia   S/P CABG (coronary artery bypass graft)   Diabetes mellitus type 2 with complications (HCC)   Sepsis (HCC)   Hypoxia   Elevated troponin   Pneumonia    Time spent: 25 min  [facility] [email or web address], password [facility] 01/26/2015, 11:13 AM  LOS: 5 days

## 2015-01-27 ENCOUNTER — Encounter (HOSPITAL_COMMUNITY): Payer: PPO

## 2015-01-27 LAB — GLUCOSE, CAPILLARY
Glucose-Capillary: 167 mg/dL — ABNORMAL HIGH (ref 65–99)
Glucose-Capillary: 156 mg/dL — ABNORMAL HIGH (ref 65–99)

## 2015-01-27 MED ORDER — HYDROCODONE-ACETAMINOPHEN 5-325 MG PO TABS: 1.0000 | ORAL_TABLET | Freq: Four times a day (QID) | ORAL | Status: DC | PRN

## 2015-01-27 MED ORDER — MOXIFLOXACIN HCL 400 MG PO TABS: 400.0000 mg | ORAL_TABLET | Freq: Every day | ORAL | Status: DC

## 2015-01-27 MED ORDER — ACETAMINOPHEN 325 MG PO TABS: 650.0000 mg | ORAL_TABLET | Freq: Four times a day (QID) | ORAL | Status: DC | PRN

## 2015-01-27 NOTE — Progress Notes (Addendum)
Physical Therapy Treatment Patient Details Name: Nathan [name] MRN: [id number] DOB: [birth year] Today's Date: 01/27/2015    History of Present Illness Nathan [name] is a 81 year old male with a past medical history significant for diabetes mellitus type 2, HLD, CAD s/p CABG; who presented with progressively worsening abdominal pain for 3 days. In the ER, fever of 101.40F. Initial lab work showed a WBC of 24.4 and lactic acid level of 2.1 CT scan demonstrated acute cholecystitis. He was started on zosyn. Surgery recommended conservative management given the patient's CAD/comorbidities    PT Comments    Patient continues with some unsteadiness and admits will not have 24 hour assist.  Could stay with his daughter where he would have more stairs and pets to deal with.  Feel will benefit from HHPT for progressing safety and independence with mobility.  States has neighbor who can check on him, encouraged him to rely on support including grandchildren and daughter.  Also educated on O2 use in the home as pt relutant to use.  Feel pt at risk due to new O2 and new cholecystostomy drain, but not agreeable to go any where but home at this tim so will maximize support as able.   Follow Up Recommendations  Supervision/Assistance - 24 hour;Supervision - Intermittent;Home health PT     Equipment Recommendations  Rolling walker with 5" wheels    Recommendations for Other Services       Precautions / Restrictions Precautions Precautions: Fall Precaution Comments: cholecystostomy w/drain R flank    Mobility  Bed Mobility               General bed mobility comments: up in recliner  Transfers   Equipment used: None Transfers: Sit to/from Stand Sit to Stand: Supervision         General transfer comment: initial unsteadiness standing from recliner reaching for counter for hand hold for steadiness while detangling IV  Ambulation/Gait Ambulation/Gait assistance: Supervision Ambulation  Distance (Feet): 160 Feet Assistive device: None Gait Pattern/deviations: Step-through pattern;Decreased stride length     General Gait Details: mild unsteadiness, but able to walk without device and makes turns without LOB, but increased time   Stairs            Wheelchair Mobility    Modified Rankin (Stroke Patients Only)       Balance Overall balance assessment: Needs assistance         Standing balance support: No upper extremity supported Standing balance-[name]: Fair Standing balance comment: able to stand without LOB or UE support, but worse initially per patient, improves after few steps                    Cognition Arousal/Alertness: Awake/alert Behavior During Therapy: WFL for tasks assessed/performed Overall Cognitive Status: Within Functional Limits for tasks assessed                      Exercises      General Comments General comments (skin integrity, edema, etc.): O2 saturations measured without and with O2 for qualifications, see note previous for details.  Patient educated on need for O2 at home and fall prevention including not rushing to get up, footwear, lighting and clutter free environment.  Discussed options of help at home, and encouraged pt to rely on his social support including neight bor, daughter and grandaughter.      Pertinent Vitals/Pain Pain Score: 0-No pain    Home Living  Prior Function            PT Goals (current goals can now be found in the care plan section) Progress towards PT goals: Progressing toward goals    Frequency  Min 3X/week    PT Plan Current plan remains appropriate    Co-evaluation             End of Session Equipment Utilized During Treatment: Gait belt;Oxygen Activity Tolerance: Patient tolerated treatment well Patient left: in chair;with call bell/phone within reach;with chair alarm set     Time: 1115-1150 PT Time Calculation (min)  (ACUTE ONLY): 35 min  Charges:  $Gait Training: 23-37 mins                    G Codes:      WYNN,CYNDI 2015-02-15, 12:20 PM  Magda Kiel, [place] 02/15/15

## 2015-01-27 NOTE — Progress Notes (Signed)
Subjective: Reports minimal abdominal pain Tolerating PO  Objective: Vital signs in last 24 hours: Temp:  [98.1 F (36.7 C)-98.6 F (37 C)] 98.1 F (36.7 C) (12/12 0556) Pulse Rate:  [65-66] 65 (12/12 0556) Resp:  [20] 20 (12/12 0556) BP: (129-144)/(60-71) 135/60 mmHg (12/12 0556) SpO2:  [87 %-91 %] 91 % (12/12 0556) Last BM Date: 01/23/15  Intake/Output from previous day: 12/11 0701 - 12/12 0700 In: 1965 [P.O.:1460; IV Piggyback:400] Out: 1475 [Urine:1475] Intake/Output this shift:    Abdomen soft, non tender, drain more serosang than bile  Lab Results:   Recent Labs  01/26/15 0310  WBC 14.7*  HGB 12.7*  HCT 37.3*  PLT 282   BMET  Recent Labs  01/26/15 0310  NA 140  K 3.0*  CL 104  CO2 26  GLUCOSE 186*  BUN 30*  CREATININE 1.83*  CALCIUM 8.7*   PT/INR No results for input(s): LABPROT, INR in the last 72 hours. ABG No results for input(s): PHART, HCO3 in the last 72 hours.  Invalid input(s): PCO2, PO2  Studies/Results: Nm Pulmonary Perf And Vent  01/26/2015  CLINICAL DATA:  Hypoxia EXAM: NUCLEAR MEDICINE VENTILATION - PERFUSION LUNG SCAN TECHNIQUE: Ventilation images were obtained in multiple projections using inhaled aerosol Tc-32m DTPA. Perfusion images were obtained in multiple projections after intravenous injection of Tc-30m MAA. RADIOPHARMACEUTICALS:  [id number] millicuries [id number] DTPA aerosol inhalation and [id number] millicuries [id number] MAA IV COMPARISON:  01/23/2015. FINDINGS: Ventilation: Ventilation images demonstrate some patchy changes consistent with COPD. The overall inspiratory effort is poor similar to that noted on prior chest x-ray. Perfusion: The perfusion images however demonstrate no focal segmental defect to suggest pulmonary embolism. IMPRESSION: No evidence of pulmonary embolism. Changes of COPD. Electronically Signed   By: Inez Catalina M.D.   On: 01/26/2015 14:19    Anti-infectives: Anti-infectives    Start     Dose/Rate  Route Frequency Ordered Stop   01/26/15 1115  azithromycin (ZITHROMAX) tablet 500 mg     500 mg Oral Daily 01/26/15 1112     01/23/15 1430  azithromycin (ZITHROMAX) 500 mg in dextrose 5 % 250 mL IVPB  Status:  Discontinued     500 mg 250 mL/hr over 60 Minutes Intravenous Every 24 hours 01/23/15 1347 01/26/15 1112   01/23/15 1400  vancomycin (VANCOCIN) 1,250 mg in sodium chloride 0.9 % 250 mL IVPB  Status:  Discontinued     1,250 mg 166.7 mL/hr over 90 Minutes Intravenous Every 24 hours 01/23/15 1358 01/24/15 1154   01/23/15 1030  Ampicillin-Sulbactam (UNASYN) 3 g in sodium chloride 0.9 % 100 mL IVPB     3 g 100 mL/hr over 60 Minutes Intravenous Every 6 hours 01/23/15 0930     01/21/15 1000  piperacillin-tazobactam (ZOSYN) IVPB 3.375 g  Status:  Discontinued     3.375 g 12.5 mL/hr over 240 Minutes Intravenous Every 8 hours 01/21/15 0544 01/23/15 0930   01/21/15 0730  piperacillin-tazobactam (ZOSYN) IVPB 3.375 g  Status:  Discontinued     3.375 g 100 mL/hr over 30 Minutes Intravenous  Once 01/21/15 0717 01/21/15 0719   01/21/15 0345  piperacillin-tazobactam (ZOSYN) IVPB 3.375 g     3.375 g 100 mL/hr over 30 Minutes Intravenous  Once 01/21/15 [id number] 01/21/15 0432      Assessment/Plan: s/p * No surgery found *  Cholecystitis s/p perc chole tube placement  Continuing current care.  Abdomen doing well.  Will see again later this week.  LOS: 6 days    Brodi Kari A  01/27/2015  

## 2015-01-27 NOTE — Progress Notes (Signed)
Pt discharge education and instructions completed with pt and friend at bedside; both voices understanding and denies any questions. Pt IV and telemetry removed; pt handed his prescriptions for moxifloxacin, tylenol and Vicodin. Pt educated on drain care along with friend and both denies any question. Pt home walker and oxygen delivered to bedside; pt discharge home with friend to transport him home. Pt transported off unit via wheelchair with belongings and friend at side with pt remaining on 3l oxygen. Delia Heady RN

## 2015-01-27 NOTE — Care Management Note (Signed)
Case Management Note  Patient Details  Name: Nathan [name] MRN: [id number] Date of Birth: 1933/12/16  Subjective/Objective:      Cholecystitis s/p perc chole tube placement               Action/Plan: Spoke to pt and states he lives at home alone. Has a neighbor that lives next door that is willing to assist as needed. Pt reports his grand-children also can assist him at home. Offered choice for [facility]. Pt agreeable to [facility] for HH. Contacted [facility] for oxygen and RW for home.   Expected Discharge Date:  01/27/2015              Expected Discharge Plan:  [place]  In-House Referral:  NA  Discharge planning Services  CM Consult  Post Acute Care Choice:  Home Health Choice offered to:  Patient  DME Arranged:  Walker rolling, Oxygen DME Agency:  [place] Arranged:  RN, PT [facility] Agency:  [place]  Status of Service:  Completed, signed off  Medicare Important Message Given:  Yes Date Medicare IM Given:    Medicare IM give by:    Date Additional Medicare IM Given:    Additional Medicare Important Message give by:     If discussed at [place] of Stay Meetings, dates discussed:    Additional Comments:  Erenest Rasher, RN 01/27/2015, 11:36 AM

## 2015-01-27 NOTE — Care Management Important Message (Signed)
Important Message  Patient Details  Name: Nathan [name] MRN: [id number] Date of Birth: 25-Mar-1933   Medicare Important Message Given:  Yes    Shain Pauwels P Megann [place]wood 01/27/2015, 2:09 PM

## 2015-01-27 NOTE — Discharge Summary (Signed)
Physician Discharge Summary  Nathan [name] [id number] DOB: Sep 21, 1933 DOA: 01/20/2015  PCP: Donnajean Lopes, MD  Admit date: 01/20/2015 Discharge date: 01/27/2015  Time spent: Greater than 30 minutes  Recommendations for Outpatient Follow-up:  1. Home health arranged for drain care, physical therapy 2. Home oxygen arranged. Please monitor sats to assess continued need. 3. Will follow-up in the eye are drained clinic. 4. Will follow up with general surgery for definitive management/cholecystectomy   Discharge Diagnoses:  Principal Problem:   Acute cholecystitis Active Problems:   Coronary artery disease   Hypercholesteremia   S/P CABG (coronary artery bypass graft)   Diabetes mellitus type 2 with complications (HCC)   Sepsis (HCC)   Hypoxia   Elevated troponin   Pneumonia  acute on chronic kidney disease, stage 3-4  Discharge Condition: Stable  Diet recommendation: Heart healthy carbohydrate modified  Filed Weights   01/24/15 0405 01/25/15 0637 01/26/15 0532  Weight: 84.823 kg (187 lb) 85.458 kg (188 lb 6.4 oz) 89.132 kg (196 lb 8 oz)    History of present illness/Hospital Course:  81 year old male with a past medical history significant for diabetes mellitus type 2, HLD, CAD s/p CABG; who presented with progressively worsening abdominal pain for 3 days. In the ER, fever of 101.75F. Initial lab work showed a WBC of 24.4 and lactic acid level of 2.1 CT scan demonstrated acute cholecystitis. He was started on zosyn. Surgery recommended conservative management given the patient's elevated troponin, elevated creatinine.  Sepsis secondary to Acute cholecystitis: general surgery consulted and recommended percutaneous cholecystostomy. Interventional radiology was consulted and performed the procedure. Was started on Zosyn initially. Biliary culture grew pansensitive Escherichia coli. Change to Unasyn. Patient's pain improved. Sepsis physiology resolved. Diet was advanced and  by discharge, patient was tolerating a solid diet. Will need drain for 6 weeks then consideration for cholecystectomy. Interventional radiology to follow in the draining clinic.  Hypoxia: Multifactorial: Atelectasis, pneumonia,  possible component of fluid overload. Received several doses of Lasix with some improvement. Echocardiogram showed normal ejection fraction. Patient has no history of lung disease or previous respiratory failure. VQ scan negative for PE. Initial chest x-ray without infiltrate, but repeat showed possible pulmonary edema, possible pneumonia. Urine legionella antigen and urine strep pneumonia antigen negative. Received several days of IV vancomycin which was stopped when MRSA PCR was negative. Received several days of azithromycin. By discharge, patient was feeling less short of breath but did have oxygen saturations to 86% on room air while ambulating, so home oxygen is arranged. Suspect patient will need short-term and recommend rechecking room air saturation and stopping oxygen if improved.  Coronary artery disease S/P CABG,Elevated troponin: Patient had no chest pain on admission and EKG without ischemic changes. Troponin only minimally elevated, likely related to sepsis. Patient had been scheduled for outpatient elective Cardiolite which was done inpatient. It was low risk.  Acute kidney injury versus progression of chronic kidney disease, creatinine 1.5 approximately one year ago. creatinine above 2 on admission and at discharge 1.8. May be progression of chronic kidney disease but likely also an acute component related to sepsis  Diabetes mellitus type 2 remained controlled on sliding scale insulin. May resume home regimen.  Essential Hypertension, stable  Hypercholesteremia -Continue simvastatin  GERD Continue PPI  Hypokalemia: Corrected  Consultants:  IR  General surgery  Procedures:  Perc cholecystostomy drain on 12/6  Discharge Exam: Filed Vitals:    01/27/15 0556 01/27/15 0945  BP: 135/60 114/63  Pulse: 65 73  Temp: 98.1 F (  36.7 C) 98.2 F (36.8 C)  Resp: 20     General: Comfortable. Alert, oriented. Breathing nonlabored. Noted to be walking with physical therapy earlier in the day. Cardiovascular: Regular rate rhythm without murmurs gallops rubs Respiratory: Bronchial breath sounds on the right. No wheezes rhonchi or rales Abdomen soft nontender nondistended. Gallbladder drain with serosanguineous fluid in the bag Extremities: No clubbing cyanosis or edema  Discharge Instructions   Discharge Instructions    Diet - low sodium heart healthy    Complete by:  As directed      Increase activity slowly    Complete by:  As directed           Current Discharge Medication List    START taking these medications   Details  acetaminophen (TYLENOL) 325 MG tablet Take 2 tablets (650 mg total) by mouth every 6 (six) hours as needed for mild pain (or Fever >/= 101).    HYDROcodone-acetaminophen (NORCO/VICODIN) 5-325 MG tablet Take 1 tablet by mouth every 6 (six) hours as needed for moderate pain. Qty: 20 tablet, Refills: 0    moxifloxacin (AVELOX) 400 MG tablet Take 1 tablet (400 mg total) by mouth daily at 8 pm. Qty: 7 tablet, Refills: 0      CONTINUE these medications which have NOT CHANGED   Details  amLODipine (NORVASC) 5 MG tablet Take 5 mg by mouth daily.      aspirin 81 MG tablet Take 81 mg by mouth daily.    atenolol (TENORMIN) 50 MG tablet Take 50 mg by mouth 2 (two) times daily.     insulin NPH-regular Human (NOVOLIN 70/30) (70-30) 100 UNIT/ML injection Inject 22-36 Units into the skin See admin instructions. Take 36 units in the morning and 22 units at bedtime.    multivitamin (THERAGRAN) per tablet Take 1 tablet by mouth daily.      Omega-3 Fatty Acids (FISH OIL) 1200 MG CAPS Take 2 capsules by mouth daily.     omeprazole (PRILOSEC) 20 MG capsule Take 20 mg by mouth daily.     simvastatin (ZOCOR) 20 MG tablet  Take 20 mg by mouth at bedtime.        STOP taking these medications     pioglitazone (ACTOS) 15 MG tablet        No Known Allergies Follow-up Information    Follow up with [facility], [place]. Schedule an appointment as soon as possible for a visit in 4 weeks.   Specialty:  General Surgery   Contact information:   [address] [place] [address] [phone]       Follow up with [facility], MD In 6 weeks.   Specialty:  Interventional Radiology   Why:  pt will hear from scheduler for time and date of recheck   Contact information:   [address] [place] [address] [phone]       Follow up with Donnajean Lopes, MD.   Specialty:  Internal Medicine   Contact information:   [place] [address] [phone]        The results of significant diagnostics from this hospitalization (including imaging, microbiology, ancillary and laboratory) are listed below for reference.    Significant Diagnostic Studies: Ct Abdomen Pelvis Wo Contrast  01/21/2015  CLINICAL DATA:  81 year old male with abdominal pain EXAM: CT ABDOMEN AND PELVIS WITHOUT CONTRAST TECHNIQUE: Multidetector CT imaging of the abdomen and pelvis was performed following the standard protocol without IV contrast. COMPARISON:  Abdominal CT and ultrasound dated 01/19/2015 FINDINGS: Evaluation of this exam is limited in the absence of intravenous contrast. Linear bibasilar subsegmental atelectasis/ scarring. There is advanced coronary vascular calcification with CABG clips. No intra-abdominal free air or free fluid. Diffuse hepatic steatosis. The gallbladder is moderately distended. There is thickening and inflammatory changes of the gallbladder wall with stranding of the pericholecystic fat most compatible with acute cholecystitis. There has been interval progression of the inflammatory changes involving the gallbladder compared to the prior study. No calcified  stone identified. Ultrasound is recommended for further evaluation of the gallbladder. The pancreas, spleen, and adrenal glands appear unremarkable. Small parapelvic renal cysts again noted. There is no hydronephrosis or nephrolithiasis. The visualized ureters and urinary bladder appear unremarkable. The prostate gland is enlarged measuring up to 5.7 cm transverse axial dimension. There is sigmoid diverticulosis without active inflammation. There is segmental inflammatory changes and thickening of the second portion of the duodenum, likely reactive to inflammatory changes of the gallbladder. Multiple duodenal diverticula noted measuring up to 1 point 5 cm in the distal duodenum. A smaller duodenal diverticulum is noted in the second portion of the duodenum with surrounding inflammation. This inflammatory changes are likely reactive to inflammatory changes of the gallbladder and less likely represent duodenal diverticulitis. There is no evidence of bowel obstruction. Normal appendix. There is aortoiliac atherosclerotic disease. No portal venous gas identified. There is no adenopathy. The abdominal wall soft tissues appear grossly unremarkable. There is degenerative changes of the spine. There are bilateral L5 pars defects with grade II L5-S1 anterolisthesis. No acute fracture. IMPRESSION: Interval worsening of the inflammatory changes involving the gallbladder concerning for acute cholecystitis. Ultrasound is recommended for better evaluation of the gallbladder. No calcified gallstone identified. Segmental inflammatory changes and thickening of the second portion of duodenum likely reactive to inflammatory changes of the gallbladder. A small duodenal diverticula with stranding inflammation also likely reactive to inflammatory changes of the gallbladder. Duodenal diverticulitis is less likely. Sigmoid diverticulosis. No evidence of bowel obstruction. Normal appendix. Electronically Signed   By: Anner Crete M.D.    On: 01/21/2015 02:21   Ct Abdomen Pelvis Wo Contrast  01/19/2015  CLINICAL DATA:  Mid abdominal pain with nausea and vomiting since last night. EXAM: CT ABDOMEN AND PELVIS WITHOUT CONTRAST TECHNIQUE: Multidetector CT imaging of the abdomen and pelvis was performed following the standard protocol without IV contrast. COMPARISON:  CT abdomen and pelvis dated 08/28/2010. FINDINGS: Gallbladder is moderately distended. Gallbladder walls are at least mildly thickened and indistinct with subtle pericholecystic edema suggesting acute cholecystitis. Liver, spleen, pancreas, and adrenal glands are unremarkable. Parapelvic renal cysts again noted bilaterally. Kidneys otherwise unremarkable without stone or hydronephrosis. No ureteral or bladder calculi identified. Bladder is moderately distended but otherwise unremarkable. Prostate gland is moderately enlarged causing some mass effect on the bladder base. Bowel is normal in caliber. Scattered diverticulosis noted within the sigmoid colon without evidence of acute diverticulitis. Appendix is normal. No free fluid or abscess collection. No free intraperitoneal air. No enlarged lymph nodes seen. Atherosclerotic changes are seen along the walls of the normal-caliber abdominal aorta. There is small hiatal hernia. Mild scarring/atelectasis at the adjacent lung bases. Small benign granulomas also noted at the right lung base. Fairly extensive coronary artery calcifications noted at the heart base. Median sternotomy wires in place, presumably related to a previous CABG. Scattered degenerative changes are seen throughout the thoracolumbar spine, moderate in degree. Chronic pars interarticularis defects noted at the L5-S1 level with associated grade  2 anterolisthesis of L5 on S1. No acute- appearing osseous abnormality. IMPRESSION: 1. Suspect acute cholecystitis. Recommend gallbladder ultrasound and/or nuclear medicine HIDA scan for confirmation. 2. Prostate enlargement, similar to  previous CT, with associated mass effect on the bladder base. 3. Small hiatal hernia. 4. Colonic diverticulosis without evidence of acute diverticulitis. 5. Bilateral chronic pars interarticularis defects at L5-S1 with resultant grade 2 spondylolisthesis of L5 on S1. This was also described on the previous CT report. These results were called by telephone at the time of interpretation on 01/19/2015 at 7:20 am to Dr. [name] , who verbally acknowledged these results. Electronically Signed   By: Franki Cabot M.D.   On: 01/19/2015 07:22   Dg Chest 2 View  01/23/2015  CLINICAL DATA:  SOB and weakness today; hx CAD, diabetic; best inspiration possible on AP EXAM: CHEST  2 VIEW COMPARISON:  01/22/2015 FINDINGS: Shallow lung inflation. Heart is enlarged. Patchy density at the left lung base partially obscures the hemidiaphragm. Findings consistent with atelectasis or developing infiltrate. There has been some improvement in overall aeration. IMPRESSION: Improved mild edema. Developing left lower lobe atelectasis or infiltrate. Electronically Signed   By: Nolon Nations M.D.   On: 01/23/2015 13:39   Dg Chest 2 View  01/21/2015  CLINICAL DATA:  Worsening right lower quadrant abdominal pain, hypoxia and leukocytosis, acute onset. Initial encounter. EXAM: CHEST  2 VIEW COMPARISON:  Chest radiograph performed 08/28/2010 FINDINGS: The lungs are hypoexpanded, with elevation of the right hemidiaphragm. Mild bilateral atelectasis is suggested. Vascular crowding is seen. No definite pleural effusion or pneumothorax is identified. The cardiomediastinal silhouette is mildly enlarged. The patient is status post median sternotomy. No acute osseous abnormalities are identified. IMPRESSION: 1. Lungs hypoexpanded, with elevation of the right hemidiaphragm. Mild bilateral atelectasis suggested. 2. Mild cardiomegaly. Electronically Signed   By: Garald Balding M.D.   On: 01/21/2015 00:39   Nm Myocar Multi W/spect W/wall  Motion / Ef  01/23/2015   There was no ST segment deviation noted during stress.  The study is normal.  This is a low risk study.  The left ventricular ejection fraction is normal (55-65%).  There is attenuation in basal and mid inferolateral and anterolateral stress and rest images and no evidence of ischemia. Overall LVEF 65%.   Nm Pulmonary Perf And Vent  01/26/2015  CLINICAL DATA:  Hypoxia EXAM: NUCLEAR MEDICINE VENTILATION - PERFUSION LUNG SCAN TECHNIQUE: Ventilation images were obtained in multiple projections using inhaled aerosol Tc-37m DTPA. Perfusion images were obtained in multiple projections after intravenous injection of Tc-92m MAA. RADIOPHARMACEUTICALS:  [id number] millicuries [id number] DTPA aerosol inhalation and [id number] millicuries [id number] MAA IV COMPARISON:  01/23/2015. FINDINGS: Ventilation: Ventilation images demonstrate some patchy changes consistent with COPD. The overall inspiratory effort is poor similar to that noted on prior chest x-ray. Perfusion: The perfusion images however demonstrate no focal segmental defect to suggest pulmonary embolism. IMPRESSION: No evidence of pulmonary embolism. Changes of COPD. Electronically Signed   By: Inez Catalina M.D.   On: 01/26/2015 14:19   Ir Perc Cholecystostomy  01/21/2015  CLINICAL DATA:  81 year old with acute cholecystitis. Right upper quadrant pain and elevated white blood cell count. EXAM: PERCUTANEOUS CHOLECYSTOSTOMY TUBE PLACEMENT WITH ULTRASOUND AND FLUOROSCOPIC GUIDANCE Physician: Stephan Minister. Henn, MD FLUOROSCOPY TIME:  42 seconds, 4 mGy MEDICATIONS: 1 mg versed, 50 mcg fentanyl. A radiology nurse monitored the patient for moderate sedation. ANESTHESIA/SEDATION: Moderate sedation time: 25 minutes PROCEDURE: Informed consent was obtained for percutaneous cholecystostomy tube placement. The patient  was placed supine on the interventional table. Ultrasound was used to localize the gallbladder in the right upper abdomen. The right  upper abdomen was prepped and draped in a sterile fashion. Maximal barrier sterile technique was utilized including caps, mask, sterile gowns, sterile gloves, sterile drape, hand hygiene and skin antiseptic. Skin was anesthetized with 1% lidocaine. Using ultrasound guidance, a 21 gauge needle was directed into the distended gallbladder via a transhepatic approach. Wire was advanced into the gallbladder with ultrasound and fluoroscopic guidance. Needle was exchanged for an Accustick dilator set. The tract was dilated to accommodate a 10.2 [place] multipurpose drain. Approximately 80 mL of pink colored purulent fluid was aspirated from the gallbladder. Catheter was sutured to skin and attached to gravity bag. FINDINGS: Distended gallbladder. 80 mL of purulent fluid was removed from the gallbladder. Gallbladder was decompressed at the end of the procedure. Estimated blood loss: Minimal COMPLICATIONS: None IMPRESSION: Successful percutaneous cholecystostomy tube placement. Electronically Signed   By: Markus Daft M.D.   On: 01/21/2015 18:12   Dg Chest Port 1 View  01/22/2015  CLINICAL DATA:  Onset severe shortness of breath today. Hypoxia. Coronary artery disease. EXAM: PORTABLE CHEST 1 VIEW COMPARISON:  01/21/2015 FINDINGS: Very low lung volumes are seen with bibasilar atelectasis. Interstitial prominence is also noted, and mild interstitial edema cannot be excluded. Heart size is stable allowing for decreased lung volumes. Prior CABG noted. IMPRESSION: Very low lung volumes with bibasilar atelectasis. Mild interstitial edema cannot be excluded. Electronically Signed   By: Earle Gell M.D.   On: 01/22/2015 18:43   Dg Chest Port 1 View  01/21/2015  CLINICAL DATA:  81 year old male with fever and shortness breath. Sepsis. EXAM: PORTABLE CHEST 1 VIEW COMPARISON:  Chest x-ray 01/20/2015. FINDINGS: Lung volumes are very low. There are some ill-defined bibasilar opacities which may reflect areas of atelectasis and/or  airspace consolidation (left greater than right). Mild blunting of left costophrenic sulcus, suggestive of a small left pleural effusion. No evidence of pulmonary edema. Heart size is within normal limits. Upper mediastinal contours are normal. Atherosclerosis in the thoracic aorta. Status post median sternotomy for CABG. IMPRESSION: 1. Llow lung volumes with bibasilar (left greater than right) areas of atelectasis and/or airspace consolidation, and small left pleural effusion. Electronically Signed   By: Vinnie Langton M.D.   On: 01/21/2015 07:42   US Abdomen Limited Ruq  01/19/2015  CLINICAL DATA:  Right upper quadrant abdominal pain. Nausea and vomiting. EXAM: US ABDOMEN LIMITED - RIGHT UPPER QUADRANT COMPARISON:  CT 01/19/2015.  Ultrasound 12/03/2014. FINDINGS: Gallbladder: No gallstones or wall thickening visualized. No sonographic Murphy sign noted. Common bile duct: Diameter: 3.3 mm Liver: No focal lesion identified. Within normal limits in parenchymal echogenicity. Evaluation is mildly limited by bowel gas. IMPRESSION: Negative right upper quadrant ultrasound. No evidence of cholecystitis or biliary dilatation. Electronically Signed   By: Richardean Sale M.D.   On: 01/19/2015 08:16   Echo Left ventricle: The cavity size was normal. There was moderate focal basal hypertrophy of the septum. Systolic function was normal. The estimated ejection fraction was in the range of 55% to 60%. Wall motion was normal; there were no regional wall motion abnormalities. Doppler parameters are consistent with abnormal left ventricular relaxation (grade 1 diastolic dysfunction). - Aortic valve: Trileaflet; mildly thickened, moderately calcified leaflets. There was moderate regurgitation. - Aorta: The aorta was mildly calcified. Aortic root dimension: 41 mm (ED). - Ascending aorta: The ascending aorta was mildly dilated. - Mitral valve: There was  mild regurgitation. - Left atrium: The atrium  was mildly dilated. - Pulmonary arteries: Systolic pressure was mildly increased. PA peak pressure: 37 mm Hg (S).  Impressions:  - Compared to the prior study, there has been no significant interval change. Microbiology: Recent Results (from the past 240 hour(s))  Blood culture (routine x 2)     Status: None   Collection Time: 01/20/15 11:25 PM  Result Value Ref Range Status   Specimen Description BLOOD RIGHT ARM  Final   Special Requests BOTTLES DRAWN AEROBIC AND ANAEROBIC 5ML  Final   Culture NO GROWTH 5 DAYS  Final   Report Status 01/26/2015 FINAL  Final  Blood culture (routine x 2)     Status: None   Collection Time: 01/20/15 11:30 PM  Result Value Ref Range Status   Specimen Description BLOOD LEFT HAND  Final   Special Requests BOTTLES DRAWN AEROBIC AND ANAEROBIC 5ML  Final   Culture NO GROWTH 5 DAYS  Final   Report Status 01/26/2015 FINAL  Final  Culture, body fluid-bottle     Status: None   Collection Time: 01/21/15  4:00 PM  Result Value Ref Range Status   Specimen Description FLUID GALL BLADDER  Final   Special Requests BOTTLES DRAWN AEROBIC AND ANAEROBIC 10CC  Final   Gram Stain   Final    GRAM NEGATIVE RODS IN BOTH AEROBIC AND ANAEROBIC BOTTLES CRITICAL RESULT CALLED TO, READ BACK BY AND VERIFIED WITH: Ophelia Charter RN [id number] 01/21/15 A BROWNING    Culture ESCHERICHIA COLI  Final   Report Status 01/23/2015 FINAL  Final   Organism ID, Bacteria ESCHERICHIA COLI  Final      Susceptibility   Escherichia coli - MIC*    AMPICILLIN <=2 SENSITIVE Sensitive     CEFAZOLIN <=4 SENSITIVE Sensitive     CEFEPIME <=1 SENSITIVE Sensitive     CEFTAZIDIME <=1 SENSITIVE Sensitive     CEFTRIAXONE <=1 SENSITIVE Sensitive     CIPROFLOXACIN <=0.25 SENSITIVE Sensitive     GENTAMICIN <=1 SENSITIVE Sensitive     IMIPENEM <=0.25 SENSITIVE Sensitive     TRIMETH/SULFA <=20 SENSITIVE Sensitive     AMPICILLIN/SULBACTAM <=2 SENSITIVE Sensitive     PIP/TAZO <=4 SENSITIVE Sensitive     *  ESCHERICHIA COLI  Gram stain     Status: None   Collection Time: 01/21/15  4:00 PM  Result Value Ref Range Status   Specimen Description FLUID GALL BLADDER  Final   Special Requests BOTTLES DRAWN AEROBIC AND ANAEROBIC 10CC  Final   Gram Stain   Final    ABUNDANT WBC PRESENT, PREDOMINANTLY PMN GRAM NEGATIVE RODS Gram Stain Report Called to,Read Back By and Verified With: N BUCK RN [id number] 01/21/15 A BROWNING    Report Status 01/21/2015 FINAL  Final  MRSA PCR Screening     Status: None   Collection Time: 01/23/15  1:59 PM  Result Value Ref Range Status   MRSA by PCR NEGATIVE NEGATIVE Final    Comment:        The GeneXpert MRSA Assay (FDA approved for NASAL specimens only), is one component of a comprehensive MRSA colonization surveillance program. It is not intended to diagnose MRSA infection nor to guide or monitor treatment for MRSA infections.      Labs: Basic Metabolic Panel:  Recent Labs Lab 01/20/15 2034 01/21/15 0815 01/22/15 0449 01/23/15 0420 01/26/15 0310 01/26/15 1247  NA 135 135 137 138 140  --   K 4.2 3.6 3.5 3.5 3.0*  --  CL 104 107 109 107 104  --   CO2 22 20* 20* 18* 26  --   GLUCOSE 179* 170* 182* 180* 186*  --   BUN 26* 27* 30* 31* 30*  --   CREATININE 2.02* 2.01* 2.06* 1.91* 1.83*  --   CALCIUM 9.2 8.1* 8.1* 8.5* 8.7*  --   MG  --  1.4*  --   --   --  2.0  PHOS  --  2.6  --   --   --   --    Liver Function Tests:  Recent Labs Lab 01/20/15 2034 01/21/15 0815 01/22/15 0449 01/26/15 0310  AST 31 25 25 30   ALT 20 16* 18 25  ALKPHOS 54 47 40 46  BILITOT 2.1* 1.6* 1.5* 0.7  PROT 7.0 6.1* 5.8* 5.9*  ALBUMIN 3.1* 2.6* 2.3* 2.0*    Recent Labs Lab 01/20/15 2034  LIPASE 22   No results for input(s): AMMONIA in the last 168 hours. CBC:  Recent Labs Lab 01/21/15 0815 01/22/15 0449 01/23/15 0420 01/24/15 0401 01/26/15 0310  WBC 22.4* 13.0* 12.6* 14.1* 14.7*  NEUTROABS 17.5*  --   --   --  9.9*  HGB 13.1 12.6* 13.8 12.9* 12.7*  HCT  39.0 37.6* 40.8 37.3* 37.3*  MCV 90.1 88.9 88.3 88.4 88.4  PLT 133* 141* 182 229 282   Cardiac Enzymes:  Recent Labs Lab 01/20/15 2333 01/21/15 0815 01/21/15 1254 01/21/15 1715  TROPONINI 0.09* 0.08* 0.31* 0.09*   BNP: BNP (last 3 results)  Recent Labs  01/21/15 0815 01/22/15 0449  BNP 350.0* 441.5*    ProBNP (last 3 results) No results for input(s): PROBNP in the last 8760 hours.  CBG:  Recent Labs Lab 01/26/15 0610 01/26/15 1113 01/26/15 1622 01/26/15 2132 01/27/15 0655  GLUCAP 184* 212* 142* 158* 167*       Signed:  Cortney Mckinney L  [facility] 01/27/2015, 10:45 AM

## 2015-01-27 NOTE — Progress Notes (Signed)
SATURATION QUALIFICATIONS: (This note is used to comply with regulatory documentation for home oxygen)  Patient Saturations on Room Air at Rest = 89%  Patient Saturations on Room Air while Ambulating = 86%  Patient Saturations on 2 Liters of oxygen while Ambulating = 91%  Please briefly explain why patient needs home oxygen: Patient hypoxic on RA with ambulation.  [place], [place] 01/27/2015

## 2015-01-28 ENCOUNTER — Other Ambulatory Visit: Payer: Self-pay

## 2015-01-28 ENCOUNTER — Other Ambulatory Visit: Payer: Self-pay | Admitting: General Surgery

## 2015-01-28 ENCOUNTER — Other Ambulatory Visit (HOSPITAL_COMMUNITY): Payer: Self-pay | Admitting: Radiology

## 2015-01-28 DIAGNOSIS — K810 Acute cholecystitis: Secondary | ICD-10-CM

## 2015-01-28 NOTE — Consult Note (Signed)
   [facility] CM Inpatient Consult   01/28/2015  Nathan [name] Aug 29, 1933 [id number] Late entry for 01/27/15 1300: Met with the patient at bedside.Patient evaluated for community based chronic disease management services with [place] [facility] as a benefit of patient's [employer]. Consent form signed.  Patient will receive post hospital discharge call and will be evaluated for monthly home visits for assessments and disease process education.  Left contact information and [facility] literature at bedside. Made Inpatient Case Manager aware that [place] [facility] following. Of note, [facility] Care Management services does not replace or interfere with any services that are arranged by inpatient case management or social work.  For additional questions or referrals please contact:   Natividad Brood, RN BSN [facility] Liaison  [phone] business mobile phone

## 2015-01-29 ENCOUNTER — Encounter: Payer: Self-pay | Admitting: *Deleted

## 2015-01-29 ENCOUNTER — Other Ambulatory Visit: Payer: Self-pay | Admitting: *Deleted

## 2015-01-29 NOTE — Patient Outreach (Signed)
[facility]) Care Management  01/29/2015  Traegan Chargualaf 1933/10/06 [id number]   Mr. Granade is a very pleasant 81 year old gentleman with a past medical history significant for diabetes mellitus type 2, HLD, CAD s/p CABG. Mr. Fulmer was admitted to the hospital on 01/19/15 via the ED for abdominal pain and fever. He was admitted and treated for sepsis related to cholecystitis. Surgery recommended conservative management given the patient's CAD/comorbidities and Mr. Lohan was subsequently taken to interventional radiology for placement of a percutaneous cholescystostomy tube on 01/21/15. He was discharged from the hospital on 01/27/15 to home.   I spoke today with Mr. Kriner and his daughter Taboris Tedrow, by phone for transition of care assessment.  Mr. Rensch is leaving today to go stay with his sister in [place] through the end of the week.   I reviewed medications and discharge instructions with Mr. Zafar. He has an appointment with his primary care provider, Dr. Leanna Battles, next week and has transportation to the appointment. Home health services were ordered at hospital discharge and his first nurse visit (intake) was this morning.   Mr. Grandberry agreed to a telephone outreach next week and possibly a home visit the following week. [facility] Care Management will follow Mr. Chesmore closely for Transition of Care needs. I will plan to see Mr. Favre at home/face to face inside the 30 day period subsequent to his hospital discharge.    [place] [facility]  [phone]

## 2015-02-05 ENCOUNTER — Other Ambulatory Visit: Payer: Self-pay | Admitting: *Deleted

## 2015-02-05 ENCOUNTER — Encounter: Payer: Self-pay | Admitting: *Deleted

## 2015-02-05 NOTE — Patient Outreach (Signed)
Transition of Care week #2 This RNCM covering for assigned [place]  Call to patient, spoke with patient. Patient reporting he has a lot of drainage, he does not return to MD for 4 weeks, he is measuring drainage and will report any signs or symptoms of infection Patient states he is finished with antibiotic. HHRN to be back tomorrow, today a friend is assisting with drain. Verbalizes understanding of [facility] and Home care after RNCM explained.   Assessment: Diabetes Abdominal wound with drain/Sepsis  Plan: Reviewed signs and symptoms of infection to report Reviewed [facility] program and that he would still be getting home health care services Report to [facility], patient assigned RNCM Continue transition of care program  [place] E. Laymond Purser, RN, BSN, [place] [phone]

## 2015-02-13 ENCOUNTER — Other Ambulatory Visit: Payer: Self-pay | Admitting: *Deleted

## 2015-02-13 ENCOUNTER — Encounter: Payer: PPO | Admitting: Cardiology

## 2015-02-13 ENCOUNTER — Encounter: Payer: Self-pay | Admitting: *Deleted

## 2015-02-13 NOTE — Patient Outreach (Signed)
[name]) Care Management  02/13/2015  Nathan [name] 1933/02/19 [id number]  Transition of Care Outreach #3:   I spoke with [name] by phone today to follow up on recent hospitalization for sepsis/abdominal wound and DM.   [name] reports that his home health nurse made her last visit yesterday and said that his wound looked good. He is changing his own dressings and was able to do it without difficulty this morning.   [name] reports that he began having nausea and diarrhea last evening and his daughter called Dr. Shon Baton office to report this. A prescription has been called in for [name] but he is unsure what the prescription is for and is going to have his daughter go over it with him when she returns from the drug store. He says he has been able to eat a few crackers this morning and has drank "a whole bottle of water" today.   I offered to schedule an appointment to see [name] face to face at his home next week but he declines, saying he would rather I give him a call to see how he's doing then decide if he wants a visit.   Plan: I'll call [name] next week as per his wishes.    [place] [facility]  [phone]

## 2015-02-14 NOTE — Progress Notes (Signed)
This encounter was created in error - please disregard.

## 2015-02-18 ENCOUNTER — Emergency Department (HOSPITAL_COMMUNITY): Payer: PPO

## 2015-02-18 ENCOUNTER — Ambulatory Visit (INDEPENDENT_AMBULATORY_CARE_PROVIDER_SITE_OTHER): Payer: PPO | Admitting: Nurse Practitioner

## 2015-02-18 ENCOUNTER — Encounter (HOSPITAL_COMMUNITY): Payer: Self-pay | Admitting: *Deleted

## 2015-02-18 ENCOUNTER — Encounter: Payer: Self-pay | Admitting: Nurse Practitioner

## 2015-02-18 VITALS — BP 120/70 | HR 76 | Ht 72.0 in | Wt 177.1 lb

## 2015-02-18 DIAGNOSIS — L03313 Cellulitis of chest wall: Secondary | ICD-10-CM | POA: Insufficient documentation

## 2015-02-18 DIAGNOSIS — I2510 Atherosclerotic heart disease of native coronary artery without angina pectoris: Secondary | ICD-10-CM

## 2015-02-18 DIAGNOSIS — Z79899 Other long term (current) drug therapy: Secondary | ICD-10-CM | POA: Diagnosis not present

## 2015-02-18 DIAGNOSIS — Z7982 Long term (current) use of aspirin: Secondary | ICD-10-CM | POA: Insufficient documentation

## 2015-02-18 DIAGNOSIS — Z794 Long term (current) use of insulin: Secondary | ICD-10-CM | POA: Diagnosis not present

## 2015-02-18 DIAGNOSIS — E785 Hyperlipidemia, unspecified: Secondary | ICD-10-CM | POA: Insufficient documentation

## 2015-02-18 DIAGNOSIS — I4510 Unspecified right bundle-branch block: Secondary | ICD-10-CM

## 2015-02-18 DIAGNOSIS — R0600 Dyspnea, unspecified: Secondary | ICD-10-CM | POA: Diagnosis not present

## 2015-02-18 DIAGNOSIS — Z792 Long term (current) use of antibiotics: Secondary | ICD-10-CM | POA: Diagnosis not present

## 2015-02-18 DIAGNOSIS — R0781 Pleurodynia: Secondary | ICD-10-CM | POA: Diagnosis not present

## 2015-02-18 DIAGNOSIS — E119 Type 2 diabetes mellitus without complications: Secondary | ICD-10-CM | POA: Diagnosis not present

## 2015-02-18 DIAGNOSIS — K829 Disease of gallbladder, unspecified: Secondary | ICD-10-CM | POA: Diagnosis not present

## 2015-02-18 DIAGNOSIS — Z86718 Personal history of other venous thrombosis and embolism: Secondary | ICD-10-CM | POA: Diagnosis not present

## 2015-02-18 DIAGNOSIS — R079 Chest pain, unspecified: Secondary | ICD-10-CM | POA: Diagnosis not present

## 2015-02-18 DIAGNOSIS — E7800 Pure hypercholesterolemia, unspecified: Secondary | ICD-10-CM

## 2015-02-18 DIAGNOSIS — R0789 Other chest pain: Secondary | ICD-10-CM | POA: Diagnosis not present

## 2015-02-18 DIAGNOSIS — Z85828 Personal history of other malignant neoplasm of skin: Secondary | ICD-10-CM | POA: Insufficient documentation

## 2015-02-18 DIAGNOSIS — I2583 Coronary atherosclerosis due to lipid rich plaque: Principal | ICD-10-CM

## 2015-02-18 DIAGNOSIS — R109 Unspecified abdominal pain: Secondary | ICD-10-CM | POA: Diagnosis not present

## 2015-02-18 LAB — BASIC METABOLIC PANEL
BUN: 25 mg/dL (ref 7–25)
CO2: 21 mmol/L (ref 20–31)
Calcium: 9.2 mg/dL (ref 8.6–10.3)
Chloride: 105 mmol/L (ref 98–110)
Creat: 1.65 mg/dL — ABNORMAL HIGH (ref 0.70–1.18)
Glucose, Bld: 124 mg/dL — ABNORMAL HIGH (ref 65–99)
Potassium: 4.1 mmol/L (ref 3.5–5.3)
Sodium: 139 mmol/L (ref 135–146)

## 2015-02-18 LAB — HEPATIC FUNCTION PANEL
ALT: 37 U/L (ref 9–46)
AST: 24 U/L (ref 10–35)
Albumin: 3.7 g/dL (ref 3.6–5.1)
Alkaline Phosphatase: 57 U/L (ref 40–115)
Bilirubin, Direct: 0.4 mg/dL — ABNORMAL HIGH (ref <=0.2)
Indirect Bilirubin: 0.5 mg/dL (ref 0.2–1.2)
Total Bilirubin: 0.9 mg/dL (ref 0.2–1.2)
Total Protein: 7.2 g/dL (ref 6.1–8.1)

## 2015-02-18 LAB — CBC
HCT: 40.9 % (ref 39.0–52.0)
Hemoglobin: 14 g/dL (ref 13.0–17.0)
MCH: 29.8 pg (ref 26.0–34.0)
MCHC: 34.2 g/dL (ref 30.0–36.0)
MCV: 87 fL (ref 78.0–100.0)
MPV: 10.7 fL (ref 8.6–12.4)
Platelets: 181 10*3/uL (ref 150–400)
RBC: 4.7 MIL/uL (ref 4.22–5.81)
RDW: 14 % (ref 11.5–15.5)
WBC: 13.3 10*3/uL — ABNORMAL HIGH (ref 4.0–10.5)

## 2015-02-18 NOTE — Progress Notes (Signed)
CARDIOLOGY OFFICE NOTE  Date:  02/18/2015    Nathan [name] Date of Birth: 1933/10/03 Medical Record [id number]  PCP:  Donnajean Lopes, MD  Cardiologist:  Nahser    Chief Complaint  Patient presents with  . Coronary Artery Disease    Post hospital visit for abdominal pain - seen for Dr. Acie Fredrickson    History of Present Illness: Nathan [name] is a 82 y.o. male who presents today for a post hospital visit. Seen for Dr. Acie Fredrickson.    He has a past medical history significant for diabetes mellitus type 2, HLD, CAD s/p CABG.   Seen in early December by Dr. Acie Fredrickson and was felt to be doing well.   Then presented with progressively worsening abdominal pain for 3 days right after his last visit here. In the ER, fever of 101.62F. Initial lab work showed a WBC of 24.4 and lactic acid level of 2.1 CT scan demonstrated acute cholecystitis. He was started on zosyn. Surgery recommended conservative management given the patient's elevated troponin, elevated creatinine. He had perc drain placed by IR. To have for 6 weeks and then consider cholecystectomy. During this admission, he was hypoxic - felt to be multifactorial with atelectasis, pneumonia, possible fluid overload. Was diuresed. EF normal by echo. Was treated with antibiotics and improved clinically but did require oxygen therapy. Due to the troponin, he had a Myoview while he was admitted - this was ok.   Comes in today. Here alone. Left his oxygen in the car - O2 sat down to 77%. Asking if this is long term. Feels short of breath - has to stop and rest. Notes he is fatigued. No chest pain. Not really dizzy. Lives alone but has a neighbor checking on him and helping with his perc drain. Has some mild belly pain in the right quadrant - especially with a deep breath but overall, he says he is better.   Past Medical History  Diagnosis Date  . Hyperlipidemia   . Coronary artery disease 2002    CABG x5  . Diabetes mellitus   .  Hypercholesteremia   . Mitral valve regurgitation   . Aortic insufficiency   . Skin cancer     tumor removed off of his back  . Acid reflux   . DVT (deep venous thrombosis) (HCC)     previously on coumadin  . Cholecystitis     Past Surgical History  Procedure Laterality Date  . Coronary artery bypass graft  12/21/2000    x5  . Tumor removal      off of back, skin cancer  . Skin cancer excision    . Biliary drainage catheter placement w/ bile duct tube change  01/2015     Medications: Current Outpatient Prescriptions  Medication Sig Dispense Refill  . amLODipine (NORVASC) 5 MG tablet Take 5 mg by mouth daily.      [name] aspirin 81 MG tablet Take 81 mg by mouth daily.    [name] atenolol (TENORMIN) 50 MG tablet Take 50 mg by mouth 2 (two) times daily.     . insulin NPH-regular Human (NOVOLIN 70/30) (70-30) 100 UNIT/ML injection Inject 22-36 Units into the skin See admin instructions. Take 36 units in the morning and 22 units at bedtime.    . multivitamin (THERAGRAN) per tablet Take 1 tablet by mouth daily.      . NON FORMULARY Place 2 L into the nose continuous.    . Omega-3 Fatty Acids (FISH OIL) 1200 MG  CAPS Take 2 capsules by mouth daily.     [name] omeprazole (PRILOSEC) 20 MG capsule Take 20 mg by mouth daily.     . simvastatin (ZOCOR) 20 MG tablet Take 20 mg by mouth at bedtime.       No current facility-administered medications for this visit.    Allergies: No Known Allergies  Social History: The patient  reports that he has never smoked. He has never used smokeless tobacco. He reports that he does not drink alcohol or use illicit drugs.   Family History: The patient's family history includes Cancer in his brother and sister; Diabetes in his brother; Heart attack in his father; Hypertension in his mother.   Review of Systems: Please see the history of present illness.   Otherwise, the review of systems is positive for none.   All other systems are reviewed and negative.    Physical Exam: VS:  BP 120/70 mmHg  Pulse 76  Ht 6' (1.829 m)  Wt 177 lb 1.9 oz (80.341 kg)  BMI 24.02 kg/m2  SpO2 77% .  BMI Body mass index is 24.02 kg/(m^2).  Wt Readings from Last 3 Encounters:  02/18/15 177 lb 1.9 oz (80.341 kg)  01/26/15 196 lb 8 oz (89.132 kg)  01/19/15 194 lb (87.998 kg)    General: Pleasant. He looks frail. No acute distress. Oxygen sat up to 99% with 2 liters applied.  HEENT: Normal. Neck: Supple, no JVD, carotid bruits, or masses noted.  Cardiac: Regular rate and rhythm. Diastolic murmur noted. No edema.  Respiratory:  Lungs are fairly clear to auscultation bilaterally with normal work of breathing.  GI: Soft and nontender. Perc drain in the upper right quadrant noted with bile colored drainage.  MS: No deformity or atrophy. Gait and ROM intact. Skin: Warm and dry. Color is normal.  Neuro:  Strength and sensation are intact and no gross focal deficits noted.  Psych: Alert, appropriate and with normal affect.   LABORATORY DATA:  EKG:  EKG is not ordered today.  Lab Results  Component Value Date   WBC 14.7* 01/26/2015   HGB 12.7* 01/26/2015   HCT 37.3* 01/26/2015   PLT 282 01/26/2015   GLUCOSE 186* 01/26/2015   CHOL 143 01/17/2014   TRIG 198.0* 01/17/2014   HDL 20.00* 01/17/2014   LDLDIRECT 77.1 01/16/2013   LDLCALC 83 01/17/2014   ALT 25 01/26/2015   AST 30 01/26/2015   NA 140 01/26/2015   K 3.0* 01/26/2015   CL 104 01/26/2015   CREATININE 1.83* 01/26/2015   BUN 30* 01/26/2015   CO2 26 01/26/2015   TSH 2.88 09/29/2011   PSA 1.27 08/30/2010   INR 1.58* 01/21/2015    BNP (last 3 results)  Recent Labs  01/21/15 0815 01/22/15 0449  BNP 350.0* 441.5*    ProBNP (last 3 results) No results for input(s): PROBNP in the last 8760 hours.   Other Studies Reviewed Today:  Echo from 01/2015 Left ventricle: The cavity size was normal. There was moderate focal basal hypertrophy of the septum. Systolic function was normal. The  estimated ejection fraction was in the range of 55% to 60%. Wall motion was normal; there were no regional wall motion abnormalities. Doppler parameters are consistent with abnormal left ventricular relaxation (grade 1 diastolic dysfunction). - Aortic valve: Trileaflet; mildly thickened, moderately calcified leaflets. There was moderate regurgitation. - Aorta: The aorta was mildly calcified. Aortic root dimension: 41 mm (ED). - Ascending aorta: The ascending aorta was mildly dilated. - Mitral valve:  There was mild regurgitation. - Left atrium: The atrium was mildly dilated. - Pulmonary arteries: Systolic pressure was mildly increased. PA peak pressure: 37 mm Hg (S).  Impressions:  - Compared to the prior study, there has been no significant interval change.   Myoview Study Result from 01/2015     There was no ST segment deviation noted during stress.  The study is normal.  This is a low risk study.  The left ventricular ejection fraction is normal (55-65%).  There is attenuation in basal and mid inferolateral and anterolateral stress and rest images and no evidence of ischemia. Overall LVEF 65%.       Assessment/Plan: 1. Acute cholecystitis - now with perc drain in place. Says he has follow up with general surgery. Would be an acceptable candidate for surgery when needed. Will be available as needed.   2. Hypoxia - not clear about etiology but remains hypoxic and needs his oxygen. Would favor continuing for now. He may require pulmonary referral - will defer to PCP.   3. CAD - recent Myoview was low risk. He has no symptoms.   4. HTN - BP ok on current regimen.   5. Valvular heart disease  6. Probable diastolic HF - weight way down - recheck his labs and BNP today.   Current medicines are reviewed with the patient today.  The patient does not have concerns regarding medicines other than what has been noted above.  The following changes have  been made:  See above.  Labs/ tests ordered today include:    Orders Placed This Encounter  Procedures  . Basic metabolic panel  . CBC  . Brain natriuretic peptide  . Hepatic function panel     Disposition:   FU with Dr. Acie Fredrickson in 4 months. Will be available as needed.   Patient is agreeable to this plan and will call if any problems develop in the interim.   Signed: Burtis Junes, RN, ANP-C 02/18/2015 10:31 AM  [facility] [address] [place], [place]  [address] Phone: [phone] Fax: [phone]

## 2015-02-18 NOTE — Patient Instructions (Addendum)
We will be checking the following labs today -  BNP, HPF, BMET and CBC   Medication Instructions:    Continue with your current medicines.     Testing/Procedures To Be Arranged:  N/A  Follow-Up:   See Dr. Acie Fredrickson in 4 months    Other Special Instructions:   N/A    If you need a refill on your cardiac medications before your next appointment, please call your pharmacy.   Call the [place] office at [phone] if you have any questions, problems or concerns.
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
## 2015-02-18 NOTE — ED Notes (Signed)
Pt uses home 02

## 2015-02-18 NOTE — ED Notes (Signed)
His pain is worse with breathing and with movement

## 2015-02-19 ENCOUNTER — Emergency Department (HOSPITAL_COMMUNITY): Payer: PPO

## 2015-02-19 ENCOUNTER — Emergency Department (HOSPITAL_COMMUNITY)
Admission: EM | Admit: 2015-02-19 | Discharge: 2015-02-19 | Disposition: A | Discharge status: Home / Self Care | Payer: PPO | Attending: Emergency Medicine | Admitting: Emergency Medicine

## 2015-02-19 ENCOUNTER — Encounter (HOSPITAL_COMMUNITY): Payer: Self-pay

## 2015-02-19 DIAGNOSIS — R0789 Other chest pain: Secondary | ICD-10-CM

## 2015-02-19 DIAGNOSIS — L03313 Cellulitis of chest wall: Secondary | ICD-10-CM

## 2015-02-19 DIAGNOSIS — R109 Unspecified abdominal pain: Secondary | ICD-10-CM | POA: Diagnosis not present

## 2015-02-19 LAB — CBC WITH DIFFERENTIAL/PLATELET
BASOS ABS: 0.1 10*3/uL (ref 0.0–0.1)
BASOS PCT: 0 %
Eosinophils Absolute: 0.2 10*3/uL (ref 0.0–0.7)
Eosinophils Relative: 1 %
HEMATOCRIT: 41.8 % (ref 39.0–52.0)
HEMOGLOBIN: 14.6 g/dL (ref 13.0–17.0)
LYMPHS PCT: 29 %
Lymphs Abs: 4.1 10*3/uL — ABNORMAL HIGH (ref 0.7–4.0)
MCH: 30.5 pg (ref 26.0–34.0)
MCHC: 34.9 g/dL (ref 30.0–36.0)
MCV: 87.4 fL (ref 78.0–100.0)
MONO ABS: 1.7 10*3/uL — AB (ref 0.1–1.0)
Monocytes Relative: 12 %
NEUTROS ABS: 8.3 10*3/uL — AB (ref 1.7–7.7)
NEUTROS PCT: 58 %
Platelets: 181 10*3/uL (ref 150–400)
RBC: 4.78 MIL/uL (ref 4.22–5.81)
RDW: 13.2 % (ref 11.5–15.5)
WBC: 14.2 10*3/uL — AB (ref 4.0–10.5)

## 2015-02-19 LAB — COMPREHENSIVE METABOLIC PANEL
ALBUMIN: 3.1 g/dL — AB (ref 3.5–5.0)
ALK PHOS: 65 U/L (ref 38–126)
ALT: 38 U/L (ref 17–63)
AST: 29 U/L (ref 15–41)
Anion gap: 11 (ref 5–15)
BILIRUBIN TOTAL: 1.3 mg/dL — AB (ref 0.3–1.2)
BUN: 28 mg/dL — AB (ref 6–20)
CO2: 23 mmol/L (ref 22–32)
CREATININE: 1.75 mg/dL — AB (ref 0.61–1.24)
Calcium: 9.5 mg/dL (ref 8.9–10.3)
Chloride: 105 mmol/L (ref 101–111)
GFR calc Af Amer: 41 mL/min — ABNORMAL LOW (ref >60)
GFR, EST NON AFRICAN AMERICAN: 35 mL/min — AB (ref >60)
GLUCOSE: 144 mg/dL — AB (ref 65–99)
POTASSIUM: 4.2 mmol/L (ref 3.5–5.1)
Sodium: 139 mmol/L (ref 135–145)
TOTAL PROTEIN: 7.7 g/dL (ref 6.5–8.1)

## 2015-02-19 LAB — I-STAT CHEM 8, ED
BUN: 40 mg/dL — ABNORMAL HIGH (ref 6–20)
CREATININE: 1.8 mg/dL — AB (ref 0.61–1.24)
Calcium, Ion: 1.19 mmol/L (ref 1.13–1.30)
Chloride: 105 mmol/L (ref 101–111)
Glucose, Bld: 139 mg/dL — ABNORMAL HIGH (ref 65–99)
HEMATOCRIT: 45 % (ref 39.0–52.0)
HEMOGLOBIN: 15.3 g/dL (ref 13.0–17.0)
POTASSIUM: 4.4 mmol/L (ref 3.5–5.1)
SODIUM: 140 mmol/L (ref 135–145)
TCO2: 25 mmol/L (ref 0–100)

## 2015-02-19 LAB — LIPASE, BLOOD: LIPASE: 63 U/L — AB (ref 11–51)

## 2015-02-19 LAB — BRAIN NATRIURETIC PEPTIDE: Brain Natriuretic Peptide: 43.4 pg/mL (ref 0.0–100.0)

## 2015-02-19 MED ORDER — CIPROFLOXACIN HCL 500 MG PO TABS: 500.0000 mg | ORAL_TABLET | Freq: Two times a day (BID) | ORAL | Status: DC

## 2015-02-19 MED ORDER — ACETAMINOPHEN 500 MG PO TABS
1000.0000 mg | ORAL_TABLET | Freq: Once | ORAL | Status: AC
  Administered 2015-02-19: 1000 mg via ORAL
  Filled 2015-02-19: qty 2

## 2015-02-19 MED ORDER — IOHEXOL 300 MG/ML  SOLN
80.0000 mL | Freq: Once | INTRAMUSCULAR | Status: AC | PRN
  Administered 2015-02-19: 80 mL via INTRAVENOUS

## 2015-02-19 MED ORDER — OXYCODONE HCL 5 MG PO TABS: 2.5000 mg | ORAL_TABLET | ORAL | Status: DC | PRN

## 2015-02-19 MED ORDER — OXYCODONE HCL 5 MG PO TABS
5.0000 mg | ORAL_TABLET | Freq: Once | ORAL | Status: AC
  Administered 2015-02-19: 5 mg via ORAL
  Filled 2015-02-19: qty 1
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
## 2015-02-19 NOTE — Discharge Instructions (Signed)
Cellulitis °Cellulitis is an infection of the skin and the tissue under the skin. The infected area is usually red and tender. This happens most often in the arms and lower legs. °HOME CARE  °· Take your antibiotic medicine as told. Finish the medicine even if you start to feel better. °· Keep the infected arm or leg raised (elevated). °· Put a warm cloth on the area up to 4 times per day. °· Only take medicines as told by your doctor. °· Keep all doctor visits as told. °GET HELP IF: °· You see red streaks on the skin coming from the infected area. °· Your red area gets bigger or turns a dark color. °· Your bone or joint under the infected area is painful after the skin heals. °· Your infection comes back in the same area or different area. °· You have a puffy (swollen) bump in the infected area. °· You have new symptoms. °· You have a fever. °GET HELP RIGHT AWAY IF:  °· You feel very sleepy. °· You throw up (vomit) or have watery poop (diarrhea). °· You feel sick and have muscle aches and pains. °  °This information is not intended to replace advice given to you by your health care provider. Make sure you discuss any questions you have with your health care provider. °  °Document Released: 07/21/2007 Document Revised: 10/23/2014 Document Reviewed: 04/19/2011 °Elsevier Interactive Patient Education ©2016 [employer] ° °

## 2015-02-21 ENCOUNTER — Other Ambulatory Visit: Payer: Self-pay | Admitting: *Deleted

## 2015-02-21 NOTE — Patient Outreach (Signed)
[place] [facility]) Care Management  02/21/2015  Nathan [name] 22-Dec-1933 [id number]  Unable to reach Nathan [name] by phone today for transition of care call #[phone].   Plan: telephone outreach next week.    [place] Management  [phone]

## 2015-02-25 DIAGNOSIS — I10 Essential (primary) hypertension: Secondary | ICD-10-CM | POA: Diagnosis not present

## 2015-02-25 DIAGNOSIS — I25810 Atherosclerosis of coronary artery bypass graft(s) without angina pectoris: Secondary | ICD-10-CM | POA: Diagnosis not present

## 2015-02-25 DIAGNOSIS — E1129 Type 2 diabetes mellitus with other diabetic kidney complication: Secondary | ICD-10-CM | POA: Diagnosis not present

## 2015-02-25 DIAGNOSIS — R0902 Hypoxemia: Secondary | ICD-10-CM | POA: Diagnosis not present

## 2015-02-25 DIAGNOSIS — K810 Acute cholecystitis: Secondary | ICD-10-CM | POA: Diagnosis not present

## 2015-02-25 DIAGNOSIS — N179 Acute kidney failure, unspecified: Secondary | ICD-10-CM | POA: Diagnosis not present

## 2015-02-25 DIAGNOSIS — Z6826 Body mass index (BMI) 26.0-26.9, adult: Secondary | ICD-10-CM | POA: Diagnosis not present

## 2015-02-25 DIAGNOSIS — A4151 Sepsis due to Escherichia coli [E. coli]: Secondary | ICD-10-CM | POA: Diagnosis not present

## 2015-02-27 DIAGNOSIS — R0902 Hypoxemia: Secondary | ICD-10-CM | POA: Diagnosis not present

## 2015-03-04 ENCOUNTER — Ambulatory Visit
Admission: RE | Admit: 2015-03-04 | Discharge: 2015-03-04 | Disposition: A | Discharge status: Home / Self Care | Payer: PPO | Source: Ambulatory Visit | Attending: Radiology | Admitting: Radiology

## 2015-03-04 ENCOUNTER — Ambulatory Visit
Admission: RE | Admit: 2015-03-04 | Discharge: 2015-03-04 | Disposition: A | Discharge status: Home / Self Care | Payer: PPO | Source: Ambulatory Visit | Attending: General Surgery | Admitting: General Surgery

## 2015-03-04 ENCOUNTER — Inpatient Hospital Stay: Admission: RE | Admit: 2015-03-04 | Payer: PPO | Source: Ambulatory Visit

## 2015-03-04 DIAGNOSIS — K810 Acute cholecystitis: Secondary | ICD-10-CM | POA: Diagnosis not present

## 2015-03-04 DIAGNOSIS — Z435 Encounter for attention to cystostomy: Secondary | ICD-10-CM | POA: Diagnosis not present

## 2015-03-04 NOTE — Progress Notes (Signed)
Patient ID: Nathan [name], male   DOB: Nov 30, 1933, 82 y.o.   MRN: [id number]   Referring Physician(s): [place],M  Chief Complaint: The patient is seen in follow up today s/p percutaneous cholecystostomy on 01/21/15  History of present illness: Nathan [name] is a 82 year old white male, patient Dr. Serita Grammes, who presented with acute cholecystitis in December 2016 with subsequent placement of a percutaneous cholecystostomy on 01/21/15 by Dr. Anselm Pancoast. He presents today for routine drain check/  injection. The patient reports that his drain has been functioning well. He currently denies fever, chills, nausea,or vomiting. He does have some mild right upper quadrant tenderness with deep inspiration. He has been irrigating the drain once daily and is currently on antibiotic therapy. Previous drain fluid cultures grew Escherichia coli.   Past Medical History  Diagnosis Date  . Hyperlipidemia   . Coronary artery disease 2002    CABG x5  . Diabetes mellitus   . Hypercholesteremia   . Mitral valve regurgitation   . Aortic insufficiency   . Skin cancer     tumor removed off of his back  . Acid reflux   . DVT (deep venous thrombosis) (HCC)     previously on coumadin  . Cholecystitis     Past Surgical History  Procedure Laterality Date  . Coronary artery bypass graft  12/21/2000    x5  . Tumor removal      off of back, skin cancer  . Skin cancer excision    . Biliary drainage catheter placement w/ bile duct tube change  01/2015    Allergies: Review of patient's allergies indicates no known allergies.  Medications: Prior to Admission medications   Medication Sig Start Date End Date Taking? Authorizing Provider  amLODipine (NORVASC) 5 MG tablet Take 5 mg by mouth daily.      Historical Provider, MD  aspirin 81 MG tablet Take 81 mg by mouth daily.    Historical Provider, MD  atenolol (TENORMIN) 50 MG tablet Take 50 mg by mouth 2 (two) times daily.     Historical Provider, MD  atorvastatin  (LIPITOR) 40 MG tablet Take 40 mg by mouth daily. 02/02/15   Historical Provider, MD  ciprofloxacin (CIPRO) 500 MG tablet Take 1 tablet (500 mg total) by mouth 2 (two) times daily. 02/19/15   Deno Etienne, DO  insulin NPH-regular Human (NOVOLIN 70/30) (70-30) 100 UNIT/ML injection Inject 22-36 Units into the skin See admin instructions. Take 36 units in the morning and 22 units at bedtime.    Historical Provider, MD  multivitamin [facility]) per tablet Take 1 tablet by mouth daily.      Historical Provider, MD  NON FORMULARY Place 2 L into the nose continuous.    Historical Provider, MD  Omega-3 Fatty Acids (FISH OIL) 1200 MG CAPS Take 2 capsules by mouth daily.     Historical Provider, MD  omeprazole (PRILOSEC) 20 MG capsule Take 20 mg by mouth daily.  01/06/11   Historical Provider, MD  oxyCODONE (ROXICODONE) 5 MG immediate release tablet Take 0.5 tablets (2.5 mg total) by mouth every 4 (four) hours as needed for severe pain. 02/19/15   Deno Etienne, DO  pioglitazone (ACTOS) 15 MG tablet Take 15 mg by mouth daily. 01/30/15   Historical Provider, MD     Family History  Problem Relation Age of Onset  . Heart attack Father   . Hypertension Mother   . Diabetes Brother   . Cancer Sister     breast  . Cancer Brother  throat & mouth    Social History   Social History  . Marital Status: Divorced    Spouse Name: N/A  . Number of Children: N/A  . Years of Education: N/A   Social History Main Topics  . Smoking status: Never Smoker   . Smokeless tobacco: Never Used  . Alcohol Use: No  . Drug Use: No  . Sexual Activity: Not Currently   Other Topics Concern  . Not on file   Social History Narrative     Vital Signs: BP 127/67 mmHg  Pulse 70  Temp(Src) 97.6 F (36.4 C)  SpO2 95%  Physical Exam patient awake, alert;  cholecystostomy drain is intact, insertion site okay, minimal tenderness to palpation. Green bile in drain bag. Abdomen soft, nondistended.  Imaging: No results  found.  Labs:  CBC:  Recent Labs  01/24/15 0401 01/26/15 0310 02/18/15 1033 02/19/15 0305 02/19/15 0324  WBC 14.1* 14.7* 13.3* 14.2*  --   HGB 12.9* 12.7* 14.0 14.6 15.3  HCT 37.3* 37.3* 40.9 41.8 45.0  PLT 229 282 181 181  --     COAGS:  Recent Labs  01/21/15 0815  INR 1.58*  APTT 33    BMP:  Recent Labs  01/22/15 0449 01/23/15 0420 01/26/15 0310 02/18/15 1033 02/19/15 0305 02/19/15 0324  NA 137 138 140 139 139 140  K 3.5 3.5 3.0* 4.1 4.2 4.4  CL 109 107 104 105 105 105  CO2 20* 18* 26 21 23   --   GLUCOSE 182* 180* 186* 124* 144* 139*  BUN 30* 31* 30* 25 28* 40*  CALCIUM 8.1* 8.5* 8.7* 9.2 9.5  --   CREATININE 2.06* 1.91* 1.83* 1.65* 1.75* 1.80*  GFRNONAA 29* 32* 33*  --  35*  --   GFRAA 34* 37* 39*  --  41*  --     LIVER FUNCTION TESTS:  Recent Labs  01/22/15 0449 01/26/15 0310 02/18/15 1033 02/19/15 0305  BILITOT 1.5* 0.7 0.9 1.3*  AST 25 30 24 29   ALT 18 25 37 38  ALKPHOS 40 46 57 65  PROT 5.8* 5.9* 7.2 7.7  ALBUMIN 2.3* 2.0* 3.7 3.1*    Assessment and Plan: Patient with recent history of acute cholecystitis, status post percutaneous cholecystostomy on 01/21/15. Injection of cholecystostomy drain today confirms appropriate location in gallbladder lumen. Patient is scheduled to follow-up with Dr. Donne Hazel this coming Friday. Drain will remain in place at this time. Further plans as per surgery.   Electronically Signed: D. Rowe Robert 03/04/2015, 11:31 AM   I spent a total of 15 minutes in face to face in clinical consultation, greater than 50% of which was counseling/coordinating care for percutaneous cholecystostomy drain

## 2015-03-07 ENCOUNTER — Other Ambulatory Visit: Payer: Self-pay | Admitting: *Deleted

## 2015-03-07 DIAGNOSIS — K819 Cholecystitis, unspecified: Secondary | ICD-10-CM | POA: Diagnosis not present

## 2015-03-11 ENCOUNTER — Other Ambulatory Visit: Payer: Self-pay | Admitting: General Surgery

## 2015-03-11 ENCOUNTER — Ambulatory Visit
Admission: RE | Admit: 2015-03-11 | Discharge: 2015-03-11 | Disposition: A | Discharge status: Home / Self Care | Payer: PPO | Source: Ambulatory Visit | Attending: General Surgery | Admitting: General Surgery

## 2015-03-11 DIAGNOSIS — K810 Acute cholecystitis: Secondary | ICD-10-CM
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
## 2015-03-13 ENCOUNTER — Encounter: Payer: Self-pay | Admitting: *Deleted

## 2015-03-13 ENCOUNTER — Other Ambulatory Visit: Payer: Self-pay | Admitting: *Deleted

## 2015-03-13 NOTE — Patient Outreach (Signed)
[name]) Care Management   03/13/2015  Nathan [name] 05/01/1933 BP:[id number]  Nathan [name] is an 82 y.o. male with a past medical history significant for diabetes mellitus type 2, HLD, CAD s/p CABG. Nathan [name] presented to the ER in  December with fever and abdominal pain and was found to have cholecystitis. A drain was placed as he was not felt to be a good surgical risk. Nathan [name] has been at home with a drain in place since discharge from the hospital and has followed up with his surgery/radiology (this week).   Nathan [name] was referred to [place] management for nursing services for coordination of care.   Subjective: "I think I'm doing pretty good. I just want to get off this oxygen."  Objective:  BP 144/78 mmHg  Pulse 80  Ht 1.829 m (6')  Wt 177 lb (80.287 kg)  BMI 24.00 kg/m2  SpO2 95%  Review of Systems  Constitutional: Negative.   HENT: Negative.   Eyes: Positive for blurred vision.  Respiratory: Positive for shortness of breath.        Shortness of breath with exertion over time  Cardiovascular: Negative.   Gastrointestinal: Negative.   Genitourinary: Negative.   Musculoskeletal: Negative.   Skin: Negative.   Neurological: Negative.   Psychiatric/Behavioral: Negative.     Physical Exam  Constitutional: He is oriented to person, place, and time. Vital signs are normal. He appears well-developed and well-nourished. He is active.  Neck: No JVD present.  Cardiovascular: Normal rate and regular rhythm.   Murmur heard. Respiratory: Effort normal and breath sounds normal.  GI: Soft. Bowel sounds are normal.  Neurological: He is alert and oriented to person, place, and time.  Skin: Skin is warm and dry.     Psychiatric: He has a normal mood and affect. His behavior is normal. Judgment and thought content normal.    Current Medications:   Current Outpatient Prescriptions  Medication Sig Dispense Refill  . amLODipine (NORVASC) 5 MG tablet Take 5 mg by  mouth daily.      [name] aspirin 81 MG tablet Take 81 mg by mouth daily. Reported on 03/11/2015    . atenolol (TENORMIN) 50 MG tablet Take 50 mg by mouth 2 (two) times daily.     [name] atorvastatin (LIPITOR) 40 MG tablet Take 40 mg by mouth daily.  3  . ciprofloxacin (CIPRO) 500 MG tablet Take 1 tablet (500 mg total) by mouth 2 (two) times daily. (Patient not taking: Reported on 03/11/2015) 28 tablet 0  . insulin NPH-regular Human (NOVOLIN 70/30) (70-30) 100 UNIT/ML injection Inject 22-36 Units into the skin See admin instructions. Take 36 units in the morning and 22 units at bedtime.    . multivitamin (THERAGRAN) per tablet Take 1 tablet by mouth daily.      . NON FORMULARY Place 2 L into the nose continuous.    . Omega-3 Fatty Acids (FISH OIL) 1200 MG CAPS Take 2 capsules by mouth daily.     [name] omeprazole (PRILOSEC) 20 MG capsule Take 20 mg by mouth daily.     [name] oxyCODONE (ROXICODONE) 5 MG immediate release tablet Take 0.5 tablets (2.5 mg total) by mouth every 4 (four) hours as needed for severe pain. (Patient not taking: Reported on 03/11/2015) 10 tablet 0  . pioglitazone (ACTOS) 15 MG tablet Take 15 mg by mouth daily. Reported on 03/11/2015  2    Assessment:  82 year old gentleman living alone in a mobile home in  [place]. He has a safe, clean dwelling and transportation. His closest neighbor is a nice gentleman who checked in during my visit as he was not familiar with my vehicle, to make sure Nathan [name] was okay.   Acute Health Condition - resolved (cholecystitis) - Nathan [name] appears to be progressing after hospitalization for acute cholecystitis. He still has a drain in place in the LUQ and is to return for follow up in 2 weeks, anticipating removal of the drain. He denies any discomfort, nausea, vomiting, or other symptom that was present when he was sick in the hospital. He is eating 2 meals/day, sometimes three.   Chronic Health Condition (DM) with Medication Management Concerns - Nathan [name] has DMII x  14 years. He is checking cbg's faithfully 1-2 x day. He has a fair working knowledge of prescribed carb modified diet. Nathan [name] is taking Insulin as prescribed and seeing his provider routinely. Nathan [name] is not taking Actos as is present on his medication list and therefore is not on any oral agent, only insulin. He says he feels he is taking all medications as prescribed. He is uncertain of his [id number] but thinks it is around 7. CBG averages are as follows:   7 day (n=10): 178 14 day (n=20): 174 30 day (n=37):164  Chronic Health Condition (Hypoxia) - Nathan [name] was diagnosed with hypoxia of unknown etiology when he was hospitalized in December. His understanding is that "no one knows why I have this." He is quite unsure of the plan of care for treatment of hypoxia at this time but says he was told that his primary care doctor might want to address this concern, possibly referring him to a pulmonary doctor. Nathan [name] is using his O2 @ 2l/[place] continuously as prescribed but says he has taken it off to walk to the mailbox (approx 1/8 mile) and was able to do so without shortness of breath, although his O2 sat dropped to 90 (from 98%). Today, his O2 sat at rest on 2l/[place] was 98%. His O2 sat on RA at rest (off O2 x 20 minutes while talking with me and after one trip the length of his mobile home) was 92%.   Pulmonary Perfusion Scan results 01/26/15:   CLINICAL DATA: Hypoxia  EXAM: NUCLEAR MEDICINE VENTILATION - PERFUSION LUNG SCAN  TECHNIQUE: Ventilation images were obtained in multiple projections using inhaled aerosol Tc-76m DTPA. Perfusion images were obtained in multiple projections after intravenous injection of Tc-66m MAA.  RADIOPHARMACEUTICALS: [id number] millicuries [id number] DTPA aerosol inhalation and [id number] millicuries [id number] MAA IV  COMPARISON: 01/23/2015.  FINDINGS: Ventilation: Ventilation images demonstrate some patchy changes consistent with COPD. The overall inspiratory  effort is poor similar to that noted on prior chest x-ray.  Perfusion: The perfusion images however demonstrate no focal segmental defect to suggest pulmonary embolism.  IMPRESSION: No evidence of pulmonary embolism.  Changes of COPD.   Plan:   Mr. Livengood will attend scheduled follow up appointment for possible removal of drain.   I will reach out to primary care and cardiology about hypoxia follow up.   I will reach out to Mr. Guadagnoli by phone over the next 2 weeks to follow up. I anticipate he will transition to telephonic case management soon and discussed this with him today and he is agreeable.    THN CM Care Plan Problem One        Most Recent Value   Care Plan Problem One  Knowledge Deficit R/T plan of care for hypoxia  and oxygen dependence   Role Documenting the Problem One  Care Management [place] for Problem One  Active   THN Long Term Goal (31-90 days)  Over the next 60 days, patient will verbalize understanding of plan of care for hypoxia/oxygen dependence   THN Long Term Goal Start Date  03/13/15   Interventions for Problem One Long Term Goal  Utilizing teachback method, reviewed notes provided to him by his cardiology provider and from hospital   [facility] CM Short Term Goal #1 (0-30 days)  Over the next 30 days, patient will attend scheduled appointments   THN CM Short Term Goal #1 Start Date  03/13/15   Interventions for Short Term Goal #1  Assisted patient with contacting provider to discuss next appointments and possible pulmonary referral   THN CM Short Term Goal #2 (0-30 days)  Over the next 30 days, patient will wear/use oxygen continuously as prescribed   THN CM Short Term Goal #2 Start Date  03/13/15   Interventions for Short Term Goal #2  Utilizing teachback method, reviewed with patient prescription for oxygen use and rationale    THN CM Care Plan Problem Two        Most Recent Value   Care Plan Problem Two  Medication Management Concerns    Role Documenting the Problem Two  Care Management [place] for Problem Two  Active   Interventions for Problem Two Long Term Goal   Reviewed cbg averages, medications,  provided diabetes education,  notified provider of medication check results   THN Long Term Goal (31-90) days  Over the next 31 days, patient will verbalize understanding of prescribed medications as related to plan of care for diabetes management   THN Long Term Goal Start Date  03/13/15   THN CM Short Term Goal #1 (0-30 days)  Over the next 30 days, patient will continue medications as he is currently taking them/until otherwise advised by his provider   [facility] CM Short Term Goal #1 Start Date  03/13/15   Interventions for Short Term Goal #2   Notified provider of med check results,  advised patient to continue meds as he has been taking them until further notice       [place] Management  [phone]

## 2015-03-25 ENCOUNTER — Ambulatory Visit
Admission: RE | Admit: 2015-03-25 | Discharge: 2015-03-25 | Disposition: A | Discharge status: Home / Self Care | Payer: PPO | Source: Ambulatory Visit | Attending: General Surgery | Admitting: General Surgery

## 2015-03-25 DIAGNOSIS — K810 Acute cholecystitis: Secondary | ICD-10-CM | POA: Diagnosis not present

## 2015-03-25 DIAGNOSIS — Z09 Encounter for follow-up examination after completed treatment for conditions other than malignant neoplasm: Secondary | ICD-10-CM | POA: Diagnosis not present

## 2015-03-25 DIAGNOSIS — K819 Cholecystitis, unspecified: Secondary | ICD-10-CM | POA: Diagnosis not present

## 2015-03-25 NOTE — Progress Notes (Signed)
Chief Complaint: Status post percutaneous cholecystostomy.  Referring Physician(s): Wakefield,Matthew  History of Present Illness: Nathan [name] is a 82 y.o. male status post percutaneous cholecystostomy tube placement on 01/21/2015 to treat acalculous cholecystitis.  He has now recovered clinically and has completed a three-week trial of capping the cholecystostomy tube since 03/04/2015. A cholangiogram at that time showed patency of the cystic duct and common bile duct.  Past Medical History  Diagnosis Date  . Hyperlipidemia   . Coronary artery disease 2002    CABG x5  . Diabetes mellitus   . Hypercholesteremia   . Mitral valve regurgitation   . Aortic insufficiency   . Skin cancer     tumor removed off of his back  . Acid reflux   . DVT (deep venous thrombosis) (HCC)     previously on coumadin  . Cholecystitis     Past Surgical History  Procedure Laterality Date  . Coronary artery bypass graft  12/21/2000    x5  . Tumor removal      off of back, skin cancer  . Skin cancer excision    . Biliary drainage catheter placement w/ bile duct tube change  01/2015    Allergies: Review of patient's allergies indicates no known allergies.  Medications: Prior to Admission medications   Medication Sig Start Date End Date Taking? Authorizing Provider  amLODipine (NORVASC) 5 MG tablet Take 5 mg by mouth daily.      Historical Provider, MD  aspirin 81 MG tablet Take 81 mg by mouth daily. Reported on 03/11/2015    Historical Provider, MD  atenolol (TENORMIN) 50 MG tablet Take 50 mg by mouth 2 (two) times daily.     Historical Provider, MD  atorvastatin (LIPITOR) 40 MG tablet Take 40 mg by mouth daily. 02/02/15   Historical Provider, MD  ciprofloxacin (CIPRO) 500 MG tablet Take 1 tablet (500 mg total) by mouth 2 (two) times daily. Patient not taking: Reported on 03/11/2015 02/19/15   Deno Etienne, DO  insulin NPH-regular Human (NOVOLIN 70/30) (70-30) 100 UNIT/ML injection Inject  22-36 Units into the skin See admin instructions. Take 36 units in the morning and 22 units at bedtime.    Historical Provider, MD  multivitamin [facility]) per tablet Take 1 tablet by mouth daily.      Historical Provider, MD  NON FORMULARY Place 2 L into the nose continuous.    Historical Provider, MD  Omega-3 Fatty Acids (FISH OIL) 1200 MG CAPS Take 2 capsules by mouth daily.     Historical Provider, MD  omeprazole (PRILOSEC) 20 MG capsule Take 20 mg by mouth daily.  01/06/11   Historical Provider, MD  oxyCODONE (ROXICODONE) 5 MG immediate release tablet Take 0.5 tablets (2.5 mg total) by mouth every 4 (four) hours as needed for severe pain. Patient not taking: Reported on 03/11/2015 02/19/15   Deno Etienne, DO  pioglitazone (ACTOS) 15 MG tablet Take 15 mg by mouth daily. Reported on 03/13/2015 01/30/15   Historical Provider, MD     Family History  Problem Relation Age of Onset  . Heart attack Father   . Hypertension Mother   . Diabetes Brother   . Cancer Sister     breast  . Cancer Brother     throat & mouth    Social History   Social History  . Marital Status: Divorced    Spouse Name: N/A  . Number of Children: N/A  . Years of Education: N/A   Social History Main  Topics  . Smoking status: Never Smoker   . Smokeless tobacco: Never Used  . Alcohol Use: No  . Drug Use: No  . Sexual Activity: Not Currently   Other Topics Concern  . Not on file   Social History Narrative     Review of Systems: A 12 point ROS discussed and pertinent positives are indicated in the HPI above.  All other systems are negative.  Review of Systems  Constitutional: Negative.   Gastrointestinal: Negative.     Vital Signs: BP 151/88 mmHg  Pulse 72  Temp(Src) 98.6 F (37 C)  SpO2 98%  Physical Exam  Constitutional: No distress.  Abdominal: Soft. He exhibits no distension. There is no tenderness. There is no rebound and no guarding.  Skin: He is not diaphoretic.  Nursing note and vitals  reviewed.    Imaging: Dg Sinus/fist Tube Chk-non Gi  03/25/2015  CLINICAL DATA:  Status post percutaneous cholecystostomy tube placement on 01/21/2015 to treat acute acalculous cholecystitis. Due to normal followup cholangiogram, the cholecystostomy tube was capped on 03/04/2015. After tolerating CT pain since that time, additional tube injection is now performed to determine if the catheter can be removed. EXAM: CHOLANGIOGRAM THROUGH PRE-EXISTING CHOLECYSTOSTOMY TUBE COMPARISON:  03/04/2015 ANESTHESIA/SEDATION: None CONTRAST:  15 mL Omnipaque 300 MEDICATIONS: None FLUOROSCOPY TIME:  36 seconds. PROCEDURE: The procedure, risks, benefits, and alternatives were explained to the patient. Questions regarding the procedure were encouraged and answered. The patient understands and consents to the procedure. Contrast was injected via the percutaneous cholecystostomy tube. Fluoroscopic images were obtained. Contrast was aspirated from the catheter. A retention suture at the skin exit site was cut and removed. The cholecystostomy tube was then removed. A dressing was applied at the catheter exit site. COMPLICATIONS: None FINDINGS: A cholangiogram confirms stable patency of the cystic duct with contrast entering the common bile duct normally. The common bile duct shows no evidence of obstruction or filling defects. Contrast enters the duodenum normally. No contrast extravasation is identified outside of the bile ducts or gallbladder lumen. Given length of time since catheter placement of 2 months, successful trial of capping of the catheter for 3 weeks and cholangiogram results, the percutaneous cholecystostomy tube was removed today. IMPRESSION: Cholangiogram via cholecystostomy tube shows a widely patent cystic duct and normal opacified common bile duct. The percutaneous cholecystostomy tube was removed without complication. Electronically Signed   By: Aletta Edouard M.D.   On: 03/25/2015 15:15   Dg Sinus/fist Tube  Chk-non Gi  03/04/2015  CLINICAL DATA:  82 year old male status post percutaneous cholecystostomy 01/21/2015. He returns today for injection of the drain after 6 weeks time frame. EXAM: Through the tube injection, limited cholangiogram FLUOROSCOPY TIME:  30 seconds MEDICATIONS AND MEDICAL HISTORY: None ANESTHESIA/SEDATION: None CONTRAST:  Contrast through the tube COMPLICATIONS: None PROCEDURE: Informed consent was obtained from the patient following explanation of the procedure, risks, benefits and alternatives. The patient understands, agrees and consents for the procedure. All questions were addressed. A time out was performed. Through the tube injection was performed through the indwelling cholecystostomy tube. Tube was placed back to drainage after injection. No complications.  Patient tolerated the procedure well. FINDINGS: A limited cholangiogram through the percutaneous cholecystostomy tube demonstrates patency of the cystic duct and common bile duct with contrast entering the duodenum. No large filling defects. Gallbladder is decompressed. IMPRESSION: Through the tube injection of cholecystostomy tube demonstrates patency of the cystic duct and common bile duct. Signed, Dulcy Fanny. Earleen Newport, DO V[facility] Interventional Radiology  [facility] Electronically Signed   By: Corrie Mckusick D.O.   On: 03/04/2015 12:52    Labs:  CBC:  Recent Labs  01/24/15 0401 01/26/15 0310 02/18/15 1033 02/19/15 0305 02/19/15 0324  WBC 14.1* 14.7* 13.3* 14.2*  --   HGB 12.9* 12.7* 14.0 14.6 15.3  HCT 37.3* 37.3* 40.9 41.8 45.0  PLT 229 282 181 181  --     COAGS:  Recent Labs  01/21/15 0815  INR 1.58*  APTT 33    BMP:  Recent Labs  01/22/15 0449 01/23/15 0420 01/26/15 0310 02/18/15 1033 02/19/15 0305 02/19/15 0324  NA 137 138 140 139 139 140  K 3.5 3.5 3.0* 4.1 4.2 4.4  CL 109 107 104 105 105 105  CO2 20* 18* 26 21 23   --   GLUCOSE 182* 180* 186* 124* 144* 139*  BUN 30*  31* 30* 25 28* 40*  CALCIUM 8.1* 8.5* 8.7* 9.2 9.5  --   CREATININE 2.06* 1.91* 1.83* 1.65* 1.75* 1.80*  GFRNONAA 29* 32* 33*  --  35*  --   GFRAA 34* 37* 39*  --  41*  --     LIVER FUNCTION TESTS:  Recent Labs  01/22/15 0449 01/26/15 0310 02/18/15 1033 02/19/15 0305  BILITOT 1.5* 0.7 0.9 1.3*  AST 25 30 24 29   ALT 18 25 37 38  ALKPHOS 40 46 57 65  PROT 5.8* 5.9* 7.2 7.7  ALBUMIN 2.3* 2.0* 3.7 3.1*    TUMOR MARKERS: No results for input(s): AFPTM, CEA, CA199, CHROMGRNA in the last 8760 hours.  Assessment and Plan:  A cholangiogram performed today under fluoroscopy demonstrates stable normal patency of the cystic duct with contrast entering the duodenum normally. No filling defects or contrast extravasation identified. Given cholangiogram findings and tolerance of capping of the cholecystostomy tube for 3 weeks, it was decided to remove the catheter today. The tract should be mature as it has been 8 weeks since placement. The tube was removed without difficulty. A dressing was applied at the catheter exit site.  Electronically SignedAletta Edouard T 03/25/2015, 3:45 PM   I spent a total of 15 Minutes in face to face in clinical consultation, greater than 50% of which was counseling/coordinating care post percutaneous cholecystostomy.

## 2015-03-30 DIAGNOSIS — R0902 Hypoxemia: Secondary | ICD-10-CM | POA: Diagnosis not present

## 2015-04-01 ENCOUNTER — Encounter: Payer: Self-pay | Admitting: Cardiovascular Disease

## 2015-04-01 ENCOUNTER — Ambulatory Visit (INDEPENDENT_AMBULATORY_CARE_PROVIDER_SITE_OTHER): Payer: PPO | Admitting: Cardiovascular Disease

## 2015-04-01 VITALS — BP 134/64 | HR 65 | Ht 72.0 in | Wt 182.0 lb

## 2015-04-01 DIAGNOSIS — I2510 Atherosclerotic heart disease of native coronary artery without angina pectoris: Secondary | ICD-10-CM | POA: Diagnosis not present

## 2015-04-01 NOTE — Progress Notes (Signed)
Nathan [name] Date of Birth  Dec 28, 1933       [facility]    [employer] [address], [address], [place], [place]  [address]   [place], [place]  [address] [phone]     [phone]   Fax  [phone]    Fax [phone]  Problem List: 1. Coronary artery disease-status post CABG 2. Aortic insufficiency 3. Hypercholesterolemia 4. Diabetes mellitus 5. Mitral regurgitation  History of Present Illness:  Nathan [name] is doing very well. He's not had any episodes of chest pain or shortness of breath. 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  Dec. 2, 2014:  Nathan [name] is doing well.  Not as much energy.   No CP or dyspnea.    Dec. 3,2015:  Doing well from a cardiac standpoint.  started taking insulin recently.   Worked again for [name].   Dec. 2, 2016:  Doing well from a cardiac standpoint.   BP is high today , was normal yesterday   Feb. 14, 2017:   Nathan [name] is seen back for a visit He had acute cholecystitis and had a perc drain placed.  The drain has been removed.  He never had his GB removed.  Was placed on home O2 during the hospitalization  And has been on home O2 since He does not think he needs it.   He occasionally will forget to turn it on and cannot tell the difference.  Will check his O2 sate on RA. ( time off oxygen  1440)   During his hospitalization, he had an echocardiogram that reveals normal left ventricle systolic function. He did have mild pulmonary hypertension. He had a stress Myoview study which revealed normal left ventricular systolic function and revealed no ischemia.   Current Outpatient Prescriptions on File Prior to Visit  Medication Sig Dispense Refill  . amLODipine (NORVASC) 5 MG tablet Take 5 mg by mouth daily.      [name] aspirin 81 MG tablet Take 81 mg by mouth daily. Reported on 03/11/2015    . atenolol (TENORMIN)  50 MG tablet Take 50 mg by mouth 2 (two) times daily.     [name] atorvastatin (LIPITOR) 40 MG tablet Take 40 mg by mouth daily.  3  . insulin NPH-regular Human (NOVOLIN 70/30) (70-30) 100 UNIT/ML injection Inject 22-36 Units into the skin See admin instructions. Take 36 units in the morning and 22 units at bedtime.    . multivitamin (THERAGRAN) per tablet Take 1 tablet by mouth daily.      . NON FORMULARY Place 2 L into the nose continuous.    . Omega-3 Fatty Acids (FISH OIL) 1200 MG CAPS Take 2 capsules by mouth daily.     [name] omeprazole (PRILOSEC) 20 MG capsule Take 20 mg by mouth daily.      No current facility-administered medications on file prior to visit.    No Known Allergies  Past Medical History  Diagnosis Date  . Hyperlipidemia   . Coronary artery disease 2002    CABG x5  . Diabetes mellitus   . Hypercholesteremia   . Mitral valve regurgitation   . Aortic insufficiency   . Skin cancer     tumor removed off of his back  . Acid reflux   . DVT (deep venous thrombosis) (HCC)     previously on coumadin  . Cholecystitis  Past Surgical History  Procedure Laterality Date  . Coronary artery bypass graft  12/21/2000    x5  . Tumor removal      off of back, skin cancer  . Skin cancer excision    . Biliary drainage catheter placement w/ bile duct tube change  01/2015    History  Smoking status  . Never Smoker   Smokeless tobacco  . Never Used    History  Alcohol Use No    Family History  Problem Relation Age of Onset  . Heart attack Father   . Hypertension Mother   . Diabetes Brother   . Cancer Sister     breast  . Cancer Brother     throat & mouth    Reviw of Systems:  Reviewed in the HPI.  All other systems are negative.  Physical Exam: Blood pressure 134/64, pulse 65, height 6' (1.829 m), weight 182 lb (82.555 kg), SpO2 98 %. General: Well developed, well nourished, in no acute distress. Head: Normocephalic, atraumatic, sclera non-icteric, mucus  membranes are moist,  Neck: Supple. Carotids are 2 + without bruits. No JVD Lungs: Clear bilaterally to auscultation. Heart: regular rate.  normal  S1 S2. No murmurs, gallops or rubs. Abdomen: Soft, non-tender, non-distended with normal bowel sounds. No hepatomegaly. No rebound/guarding. No masses. Msk:  Strength and tone are normal Extremities: No clubbing or cyanosis. No edema.  Distal pedal pulses are 2+ and equal bilaterally. Neuro: Alert and oriented X 3. Moves all extremities spontaneously. Psych:  Responds to questions appropriately with a normal affect.  ECG: Dec. 2, 2016:   NSR at 66.  1st degree AV block ,  RBBB . The RBBB is new since previous tracing   Assessment / Plan:   1. Coronary artery disease-status post CABG-  No angina .   LV function is normal  . 2. Aortic insufficiency 3. Hypercholesterolemia-  Doing well. Continue current medications. 4. Diabetes mellitus 5. Mitral regurgitation 6. Hypoxemia:    He would like to get rid of the home O2.   We ambulated him without the oxygen  And his O2 stat 93-94% after ambulating for 600 feet.  We will DC home O2.  7. Cholecystitis- had a perc drain .  He is doing well and could have GB removed if needed  Return in 6 months   Keatin Benham, Wonda Cheng, MD  04/01/2015 2:35 PM    [place],  [place], [place]  [address] Pager [phone] Phone: [phone]; Fax: [phone]   [facility]  [address] [place], [place]  [address] [phone]   Fax [phone]

## 2015-04-01 NOTE — Patient Instructions (Signed)
Medication Instructions:  You may discontinue your home oxygen   Labwork: Your physician recommends that you return for lab work in: 6 months on the day of or a few days before your office visit with Dr. Acie Fredrickson.  You will need to FAST for this appointment - nothing to eat or drink after midnight the night before except water.   Testing/Procedures: None Ordered   Follow-Up: Your physician wants you to follow-up in: 6 months with Dr. Acie Fredrickson.  You will receive a reminder letter in the mail two months in advance. If you don't receive a letter, please call our office to schedule the follow-up appointment.   If you need a refill on your cardiac medications before your next appointment, please call your pharmacy.   Thank you for choosing [facility]! Christen Bame, RN [phone]

## 2015-04-02 DIAGNOSIS — Z85820 Personal history of malignant melanoma of skin: Secondary | ICD-10-CM | POA: Diagnosis not present

## 2015-04-02 DIAGNOSIS — L570 Actinic keratosis: Secondary | ICD-10-CM | POA: Diagnosis not present

## 2015-04-02 DIAGNOSIS — L821 Other seborrheic keratosis: Secondary | ICD-10-CM | POA: Diagnosis not present

## 2015-04-03 ENCOUNTER — Telehealth: Payer: Self-pay | Admitting: Cardiovascular Disease

## 2015-04-03 ENCOUNTER — Encounter: Payer: Self-pay | Admitting: Nurse Practitioner

## 2015-04-03 NOTE — Telephone Encounter (Signed)
New Message  Pt requested to speakw/ Rn concerning his O2. Please call back and discuss.

## 2015-04-03 NOTE — Telephone Encounter (Signed)
Spoke with patient who states he called [facility] to ask request they pick up his home oxygen.  He said he was advised that they would have to have an order from the doctor.   I faxed an order from Dr. Acie Fredrickson to d/c oxygen to [phone] and confirmation was received.

## 2015-04-11 ENCOUNTER — Encounter: Payer: Self-pay | Admitting: Podiatry

## 2015-04-11 ENCOUNTER — Ambulatory Visit (INDEPENDENT_AMBULATORY_CARE_PROVIDER_SITE_OTHER): Payer: PPO | Admitting: Podiatry

## 2015-04-11 DIAGNOSIS — M79676 Pain in unspecified toe(s): Secondary | ICD-10-CM | POA: Diagnosis not present

## 2015-04-11 DIAGNOSIS — M2040 Other hammer toe(s) (acquired), unspecified foot: Secondary | ICD-10-CM

## 2015-04-11 DIAGNOSIS — Q828 Other specified congenital malformations of skin: Secondary | ICD-10-CM

## 2015-04-11 DIAGNOSIS — B351 Tinea unguium: Secondary | ICD-10-CM

## 2015-04-11 NOTE — Progress Notes (Signed)
Patient ID: Nathan [name], male   DOB: [birth year], 82 y.o.   MRN: [id number] Complaint:  Visit Type: Patient returns to my office for continued preventative foot care services. Complaint: Patient states" my nails have grown long and thick and become painful to walk and wear shoes" Patient has been diagnosed with DM with no foot complications. The patient presents for preventative foot care services. No changes to ROS  Podiatric Exam: Vascular: dorsalis pedis and posterior tibial pulses are palpable bilateral. Capillary return is immediate. Temperature gradient is WNL. Skin turgor WNL  Sensorium: Diminished  Semmes Weinstein monofilament test. Normal tactile sensation bilaterally. Nail Exam: Pt has thick disfigured discolored nails with subungual debris noted bilateral entire nail hallux through fifth toenails Ulcer Exam: There is no evidence of ulcer or pre-ulcerative changes or infection. Orthopedic Exam: Muscle tone and strength are WNL. No limitations in general ROM. No crepitus or effusions noted. Foot type and digits show no abnormalities. Bony prominences are unremarkable.Hammer toes second B/l Skin: No Porokeratosis. No infection or ulcers. Porokeratosis sub tibial sesamoid both feet.  Diagnosis:  Onychomycosis, , Pain in right toe, pain in left toes, Porokeratosis  Treatment & Plan Procedures and Treatment: Consent by patient was obtained for treatment procedures. The patient understood the discussion of treatment and procedures well. All questions were answered thoroughly reviewed. Debridement of mycotic and hypertrophic toenails, 1 through 5 bilateral and clearing of subungual debris. No ulceration, no infection noted.  Initiated diabetic shoe paperwork. For diabetic neuropathy , plantar callus and hammer toe 2 B/L. Return Visit-Office Procedure: Patient instructed to return to the office for a follow up visit 3 months for continued evaluation and treatment.

## 2015-04-14 ENCOUNTER — Other Ambulatory Visit: Payer: Self-pay | Admitting: *Deleted

## 2015-04-14 ENCOUNTER — Encounter: Payer: Self-pay | Admitting: *Deleted

## 2015-04-14 NOTE — Patient Outreach (Signed)
[facility]) Care Management  04/14/2015  Kendon Boozer [birth year] [id number]  I spoke with Mr. Abajian by phone today. He was very happy to report that his oxygen was discontinued by the cardiologist and he was given an appointment for 6 months from now. Mr. Scherger is able to confirm knowledge of his medications and plan of care. He declined my offer to transition him to telephonic case management and says he will call his doctor offices or me if he has questions or concerns for which he is unable to get an answer in the future.   Case Closed. We are happy to engage with Mr. Biesinger at any time in the future should case management needs arise.   , [facility]  [phone]

## 2015-04-18 DIAGNOSIS — E1129 Type 2 diabetes mellitus with other diabetic kidney complication: Secondary | ICD-10-CM | POA: Diagnosis not present

## 2015-04-18 DIAGNOSIS — I351 Nonrheumatic aortic (valve) insufficiency: Secondary | ICD-10-CM | POA: Diagnosis not present

## 2015-04-18 DIAGNOSIS — I25810 Atherosclerosis of coronary artery bypass graft(s) without angina pectoris: Secondary | ICD-10-CM | POA: Diagnosis not present

## 2015-04-18 DIAGNOSIS — Z6826 Body mass index (BMI) 26.0-26.9, adult: Secondary | ICD-10-CM | POA: Diagnosis not present

## 2015-05-20 DIAGNOSIS — I10 Essential (primary) hypertension: Secondary | ICD-10-CM | POA: Diagnosis not present

## 2015-05-20 DIAGNOSIS — E1129 Type 2 diabetes mellitus with other diabetic kidney complication: Secondary | ICD-10-CM | POA: Diagnosis not present

## 2015-05-20 DIAGNOSIS — Z6826 Body mass index (BMI) 26.0-26.9, adult: Secondary | ICD-10-CM | POA: Diagnosis not present

## 2015-05-20 DIAGNOSIS — N183 Chronic kidney disease, stage 3 (moderate): Secondary | ICD-10-CM | POA: Diagnosis not present

## 2015-07-03 DIAGNOSIS — C44111 Basal cell carcinoma of skin of unspecified eyelid, including canthus: Secondary | ICD-10-CM | POA: Diagnosis not present

## 2015-07-03 DIAGNOSIS — H5203 Hypermetropia, bilateral: Secondary | ICD-10-CM | POA: Diagnosis not present

## 2015-07-03 DIAGNOSIS — H353121 Nonexudative age-related macular degeneration, left eye, early dry stage: Secondary | ICD-10-CM | POA: Diagnosis not present

## 2015-07-03 DIAGNOSIS — H353111 Nonexudative age-related macular degeneration, right eye, early dry stage: Secondary | ICD-10-CM | POA: Diagnosis not present

## 2015-07-03 DIAGNOSIS — E119 Type 2 diabetes mellitus without complications: Secondary | ICD-10-CM | POA: Diagnosis not present

## 2015-07-03 DIAGNOSIS — Z961 Presence of intraocular lens: Secondary | ICD-10-CM | POA: Diagnosis not present

## 2015-07-03 DIAGNOSIS — Z794 Long term (current) use of insulin: Secondary | ICD-10-CM | POA: Diagnosis not present

## 2015-07-03 DIAGNOSIS — H02822 Cysts of right lower eyelid: Secondary | ICD-10-CM | POA: Diagnosis not present

## 2015-07-03 DIAGNOSIS — H52223 Regular astigmatism, bilateral: Secondary | ICD-10-CM | POA: Diagnosis not present

## 2015-07-11 ENCOUNTER — Ambulatory Visit (INDEPENDENT_AMBULATORY_CARE_PROVIDER_SITE_OTHER): Payer: PPO | Admitting: Podiatry

## 2015-07-11 ENCOUNTER — Encounter: Payer: Self-pay | Admitting: Podiatry

## 2015-07-11 DIAGNOSIS — B351 Tinea unguium: Secondary | ICD-10-CM

## 2015-07-11 DIAGNOSIS — M79676 Pain in unspecified toe(s): Secondary | ICD-10-CM

## 2015-07-11 NOTE — Progress Notes (Signed)
Patient ID: Nathan [name], male   DOB: 1933/11/01, 82 y.o.   MRN: [id number] Complaint:  Visit Type: Patient returns to my office for continued preventative foot care services. Complaint: Patient states" my nails have grown long and thick and become painful to walk and wear shoes" Patient has been diagnosed with DM with no foot complications. The patient presents for preventative foot care services. No changes to ROS  Podiatric Exam: Vascular: dorsalis pedis and posterior tibial pulses are palpable bilateral. Capillary return is immediate. Temperature gradient is WNL. Skin turgor WNL  Sensorium: Diminished  Semmes Weinstein monofilament test. Normal tactile sensation bilaterally. Nail Exam: Pt has thick disfigured discolored nails with subungual debris noted bilateral entire nail hallux through fifth toenails Ulcer Exam: There is no evidence of ulcer or pre-ulcerative changes or infection. Orthopedic Exam: Muscle tone and strength are WNL. No limitations in general ROM. No crepitus or effusions noted. Foot type and digits show no abnormalities. Bony prominences are unremarkable.Hammer toes second B/l Skin: No Porokeratosis. No infection or ulcers. Porokeratosis sub tibial sesamoid both feet.  Diagnosis:  Onychomycosis, , Pain in right toe, pain in left toes, Porokeratosis  Treatment & Plan Procedures and Treatment: Consent by patient was obtained for treatment procedures. The patient understood the discussion of treatment and procedures well. All questions were answered thoroughly reviewed. Debridement of mycotic and hypertrophic toenails, 1 through 5 bilateral and clearing of subungual debris. No ulceration, no infection noted.  Initiated diabetic shoe paperwork. For diabetic neuropathy , plantar callus and hammer toe 2 B/L. Return Visit-Office Procedure: Patient instructed to return to the office for a follow up visit 3 months for continued evaluation and treatment.

## 2015-07-29 DIAGNOSIS — E1129 Type 2 diabetes mellitus with other diabetic kidney complication: Secondary | ICD-10-CM | POA: Diagnosis not present

## 2015-07-29 DIAGNOSIS — Z6827 Body mass index (BMI) 27.0-27.9, adult: Secondary | ICD-10-CM | POA: Diagnosis not present

## 2015-07-29 DIAGNOSIS — I10 Essential (primary) hypertension: Secondary | ICD-10-CM | POA: Diagnosis not present

## 2015-07-29 DIAGNOSIS — N183 Chronic kidney disease, stage 3 (moderate): Secondary | ICD-10-CM | POA: Diagnosis not present

## 2015-08-25 DIAGNOSIS — I25810 Atherosclerosis of coronary artery bypass graft(s) without angina pectoris: Secondary | ICD-10-CM | POA: Diagnosis not present

## 2015-08-25 DIAGNOSIS — E1121 Type 2 diabetes mellitus with diabetic nephropathy: Secondary | ICD-10-CM | POA: Diagnosis not present

## 2015-08-25 DIAGNOSIS — I351 Nonrheumatic aortic (valve) insufficiency: Secondary | ICD-10-CM | POA: Diagnosis not present

## 2015-08-25 DIAGNOSIS — I10 Essential (primary) hypertension: Secondary | ICD-10-CM | POA: Diagnosis not present

## 2015-08-25 DIAGNOSIS — Z6827 Body mass index (BMI) 27.0-27.9, adult: Secondary | ICD-10-CM | POA: Diagnosis not present

## 2015-08-25 DIAGNOSIS — Z1389 Encounter for screening for other disorder: Secondary | ICD-10-CM | POA: Diagnosis not present

## 2015-08-29 ENCOUNTER — Ambulatory Visit: Payer: PPO | Admitting: *Deleted

## 2015-08-29 DIAGNOSIS — E1142 Type 2 diabetes mellitus with diabetic polyneuropathy: Secondary | ICD-10-CM

## 2015-08-29 NOTE — Progress Notes (Signed)
Patient ID: Nathan [name], male   DOB: [birth year], 82 y.o.   MRN: [id number] Patient presents to be scanned and measured for diabetic shoes and inserts.

## 2015-09-09 DIAGNOSIS — Z6826 Body mass index (BMI) 26.0-26.9, adult: Secondary | ICD-10-CM | POA: Diagnosis not present

## 2015-09-09 DIAGNOSIS — K6389 Other specified diseases of intestine: Secondary | ICD-10-CM | POA: Diagnosis not present

## 2015-09-09 DIAGNOSIS — R1032 Left lower quadrant pain: Secondary | ICD-10-CM | POA: Diagnosis not present

## 2015-09-09 DIAGNOSIS — R197 Diarrhea, unspecified: Secondary | ICD-10-CM | POA: Diagnosis not present

## 2015-09-23 ENCOUNTER — Ambulatory Visit (INDEPENDENT_AMBULATORY_CARE_PROVIDER_SITE_OTHER): Payer: PPO | Admitting: Podiatry

## 2015-09-23 DIAGNOSIS — E1142 Type 2 diabetes mellitus with diabetic polyneuropathy: Secondary | ICD-10-CM | POA: Diagnosis not present

## 2015-09-23 DIAGNOSIS — M2040 Other hammer toe(s) (acquired), unspecified foot: Secondary | ICD-10-CM | POA: Diagnosis not present

## 2015-09-23 DIAGNOSIS — L84 Corns and callosities: Secondary | ICD-10-CM | POA: Diagnosis not present

## 2015-09-23 NOTE — Patient Instructions (Signed)

## 2015-09-23 NOTE — Progress Notes (Signed)
Patient ID: Nathan [name], male   DOB: 1933/03/14, 82 y.o.   MRN: [id number]  Patient presents for diabetic shoe pick up, shoes are tried on for good fit.  Patient received 1 pair Apex [id number] Active walker white in men's size 11.5 wide and 3 pairs custom molded diabetic inserts with unloads for 1st met bilateral.  Verbal and written break in and wear instructions given.  Patient will follow up for scheduled routine care.   GENERAL APPEARANCE: Alert, conversant. Appropriately groomed. No acute distress.  VASCULAR: Pedal pulses are  palpable at  [facility] and PT bilateral.  Capillary refill time is immediate to all digits,  Normal temperature gradient.   NEUROLOGIC: sensation is diminished  to 5.07 monofilament at 5/5 sites bilateral.  Light touch is intact bilateral, Muscle strength normal.  MUSCULOSKELETAL: acceptable muscle strength, tone and stability bilateral.  Intrinsic muscluature intact bilateral.  Rectus appearance of foot and digits noted bilateral.   DERMATOLOGIC: skin color, texture, and turgor are within normal limits.  No preulcerative lesions or ulcers  are seen, no interdigital maceration noted.  No open lesions present.  . No drainage noted. Porokeratosis sub 1 B/L  NAILS  Thick disfigured discolored nails both feet.     Dx  Diabetic neuropathy  Hammer toes  Pre-ulcerative callus  Tx  Dispense diabetic shoes.  Patient presents today and was dispensed 0ne pair ( two units) of medically necessary extra depth shoes with three pair( six units) of custom molded multiple density inserts. The shoes and the inserts are fitted to the patients ' feet and are noted to fit well and are free of defect.  Length and width of the shoes are also acceptable.  Patient was given written and verbal  instructions for wearing.  If any concerns arrive with the shoes or inserts, the patient is to call the office.Patient is to follow up with doctor in six weeks.   Gardiner Barefoot DPM

## 2015-10-01 ENCOUNTER — Other Ambulatory Visit: Payer: PPO

## 2015-10-01 ENCOUNTER — Ambulatory Visit: Payer: PPO | Admitting: Cardiovascular Disease

## 2015-10-01 DIAGNOSIS — L578 Other skin changes due to chronic exposure to nonionizing radiation: Secondary | ICD-10-CM | POA: Diagnosis not present

## 2015-10-01 DIAGNOSIS — L570 Actinic keratosis: Secondary | ICD-10-CM | POA: Diagnosis not present

## 2015-10-02 ENCOUNTER — Other Ambulatory Visit: Payer: PPO | Admitting: *Deleted

## 2015-10-02 ENCOUNTER — Encounter (INDEPENDENT_AMBULATORY_CARE_PROVIDER_SITE_OTHER): Payer: Self-pay

## 2015-10-02 ENCOUNTER — Encounter: Payer: Self-pay | Admitting: Cardiovascular Disease

## 2015-10-02 ENCOUNTER — Ambulatory Visit (INDEPENDENT_AMBULATORY_CARE_PROVIDER_SITE_OTHER): Payer: PPO | Admitting: Cardiovascular Disease

## 2015-10-02 VITALS — BP 128/74 | HR 66 | Ht 72.0 in | Wt 192.0 lb

## 2015-10-02 DIAGNOSIS — E7800 Pure hypercholesterolemia, unspecified: Secondary | ICD-10-CM | POA: Diagnosis not present

## 2015-10-02 DIAGNOSIS — I2510 Atherosclerotic heart disease of native coronary artery without angina pectoris: Secondary | ICD-10-CM

## 2015-10-02 DIAGNOSIS — Z951 Presence of aortocoronary bypass graft: Secondary | ICD-10-CM | POA: Diagnosis not present

## 2015-10-02 DIAGNOSIS — I351 Nonrheumatic aortic (valve) insufficiency: Secondary | ICD-10-CM | POA: Diagnosis not present

## 2015-10-02 DIAGNOSIS — I2583 Coronary atherosclerosis due to lipid rich plaque: Principal | ICD-10-CM

## 2015-10-02 LAB — COMPREHENSIVE METABOLIC PANEL
ALT: 21 U/L (ref 9–46)
AST: 22 U/L (ref 10–35)
Albumin: 3.4 g/dL — ABNORMAL LOW (ref 3.6–5.1)
Alkaline Phosphatase: 65 U/L (ref 40–115)
BUN: 29 mg/dL — AB (ref 7–25)
CHLORIDE: 108 mmol/L (ref 98–110)
CO2: 21 mmol/L (ref 20–31)
CREATININE: 1.74 mg/dL — AB (ref 0.70–1.18)
Calcium: 9 mg/dL (ref 8.6–10.3)
GLUCOSE: 110 mg/dL — AB (ref 65–99)
Potassium: 4.3 mmol/L (ref 3.5–5.3)
SODIUM: 140 mmol/L (ref 135–146)
Total Bilirubin: 0.8 mg/dL (ref 0.2–1.2)
Total Protein: 6.5 g/dL (ref 6.1–8.1)

## 2015-10-02 LAB — LIPID PANEL
Cholesterol: 152 mg/dL (ref 125–200)
HDL: 31 mg/dL — AB (ref >=40)
LDL CALC: 79 mg/dL (ref <130)
Total CHOL/HDL Ratio: 4.9 Ratio (ref <=5.0)
Triglycerides: 209 mg/dL — ABNORMAL HIGH (ref <150)
VLDL: 42 mg/dL — ABNORMAL HIGH (ref <30)

## 2015-10-02 NOTE — Progress Notes (Signed)
Nathan [name] Date of Birth  [birth year]       [facility]    [employer] [address], [place], [place]  [address]   [place], [place]  [address] [phone]     [phone]   Fax  [phone]    Fax [phone]  Problem List: 1. Coronary artery disease-status post CABG 2. Aortic insufficiency 3. Hypercholesterolemia 4. Diabetes mellitus 5. Mitral regurgitation  History of Present Illness:  Nathan [name] is doing very well. He's not had any episodes of chest pain or shortness of breath. 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  Dec. 2, 2014:  Nathan [name] is doing well.  Not as much energy.   No CP or dyspnea.    Dec. 3,2015:  Doing well from a cardiac standpoint.  started taking insulin recently.   Worked again for [name].   Dec. 2, 2016:  Doing well from a cardiac standpoint.   BP is high today , was normal yesterday   Feb. 14, 2017:   Nathan [name] is seen back for a visit He had acute cholecystitis and had a perc drain placed.  The drain has been removed.  He never had his GB removed.  Was placed on home O2 during the hospitalization  And has been on home O2 since He does not think he needs it.   He occasionally will forget to turn it on and cannot tell the difference.  Will check his O2 sate on RA. ( time off oxygen  1440)   During his hospitalization, he had an echocardiogram that reveals normal left ventricle systolic function. He did have mild pulmonary hypertension. He had a stress Myoview study which revealed normal left ventricular systolic function and revealed no ischemia.  Aug. 17, 2017:  Doing well. Had the gall bladder percutaneous drain removed. Did not have GB removed.  Stands on his feet all day , has some leg edema     Current Outpatient Prescriptions on File Prior to Visit  Medication Sig Dispense Refill  .  amLODipine (NORVASC) 5 MG tablet Take 5 mg by mouth daily.      [name] aspirin 81 MG tablet Take 81 mg by mouth daily. Reported on 03/11/2015    . atenolol (TENORMIN) 50 MG tablet Take 50 mg by mouth 2 (two) times daily.     [name] atorvastatin (LIPITOR) 40 MG tablet Take 40 mg by mouth daily.  3  . insulin NPH-regular Human (NOVOLIN 70/30) (70-30) 100 UNIT/ML injection Inject 22-36 Units into the skin See admin instructions. Take 36 units in the morning and 22 units at bedtime.    . multivitamin (THERAGRAN) per tablet Take 1 tablet by mouth daily.      . NON FORMULARY Place 2 L into the nose continuous.    . Omega-3 Fatty Acids (FISH OIL) 1200 MG CAPS Take 2 capsules by mouth daily.     [name] omeprazole (PRILOSEC) 20 MG capsule Take 20 mg by mouth daily.      No current facility-administered medications on file prior to visit.     No Known Allergies  Past Medical History:  Diagnosis Date  . Acid reflux   . Aortic insufficiency   . Cholecystitis   . Coronary artery disease 2002   CABG x5  . Diabetes mellitus   . DVT (deep venous thrombosis) ([place])  previously on coumadin  . Hypercholesteremia   . Hyperlipidemia   . Mitral valve regurgitation   . Skin cancer    tumor removed off of his back    Past Surgical History:  Procedure Laterality Date  . BILIARY DRAINAGE CATHETER PLACEMENT W/ BILE DUCT TUBE CHANGE  01/2015  . CORONARY ARTERY BYPASS GRAFT  12/21/2000   x5  . SKIN CANCER EXCISION    . TUMOR REMOVAL     off of back, skin cancer    History  Smoking Status  . Never Smoker  Smokeless Tobacco  . Never Used    History  Alcohol Use No    Family History  Problem Relation Age of Onset  . Heart attack Father   . Hypertension Mother   . Diabetes Brother   . Cancer Sister     breast  . Cancer Brother     throat & mouth    Reviw of Systems:  Reviewed in the HPI.  All other systems are negative.  Physical Exam: Blood pressure 128/74, pulse 66, height 6' (1.829 m), weight  192 lb (87.1 kg), SpO2 97 %. General: Well developed, well nourished, in no acute distress. Head: Normocephalic, atraumatic, sclera non-icteric, mucus membranes are moist,  Neck: Supple. Carotids are 2 + without bruits. No JVD Lungs: Clear bilaterally to auscultation. Heart: regular rate.  normal  S1 S2. No murmurs, gallops or rubs. Abdomen: Soft, non-tender, non-distended with normal bowel sounds. No hepatomegaly. No rebound/guarding. No masses. Msk:  Strength and tone are normal Extremities: No clubbing or cyanosis. 1+ bilateral edema. .  Distal pedal pulses are 2+ and equal bilaterally. Neuro: Alert and oriented X 3. Moves all extremities spontaneously. Psych:  Responds to questions appropriately with a normal affect.  ECG:  Assessment / Plan:   1. Coronary artery disease-status post CABG-  No angina .   LV function is normal  . 2. Aortic insufficiency - stable   3. Hypercholesterolemia-  Doing well. Continue current medications.  Labs drawn this am .  4. Diabetes mellitus- managed by Dr. Philip Aspen   5. Mitral regurgitation  6. Leg edema - eats a large piece of bologna every morning, advised him to avoid salty meats as much as possible. The edema resolves when he elevates his legs and when he eats at home.    Return in 6 months   [name], MD  10/02/2015 8:06 AM    [address],  [place], [place]  [address] Pager [phone] Phone: [phone]; Fax: [phone]

## 2015-10-02 NOTE — Patient Instructions (Signed)
Medication Instructions:  Your physician recommends that you continue on your current medications as directed. Please refer to the Current Medication list given to you today.   Labwork: Done Today - I will call you with your results   Testing/Procedures: None Ordered   Follow-Up: Your physician wants you to follow-up in: 6 months with Dr. Acie Fredrickson.  You will receive a reminder letter in the mail two months in advance. If you don't receive a letter, please call our office to schedule the follow-up appointment.   If you need a refill on your cardiac medications before your next appointment, please call your pharmacy.   Thank you for choosing [facility]! Christen Bame, RN [phone]

## 2015-10-17 ENCOUNTER — Ambulatory Visit: Payer: PPO | Admitting: Podiatry

## 2015-10-21 ENCOUNTER — Encounter: Payer: Self-pay | Admitting: Podiatry

## 2015-10-21 ENCOUNTER — Ambulatory Visit (INDEPENDENT_AMBULATORY_CARE_PROVIDER_SITE_OTHER): Payer: PPO | Admitting: Podiatry

## 2015-10-21 DIAGNOSIS — M79676 Pain in unspecified toe(s): Secondary | ICD-10-CM | POA: Diagnosis not present

## 2015-10-21 DIAGNOSIS — B351 Tinea unguium: Secondary | ICD-10-CM | POA: Diagnosis not present

## 2015-10-21 DIAGNOSIS — E1142 Type 2 diabetes mellitus with diabetic polyneuropathy: Secondary | ICD-10-CM

## 2015-10-21 NOTE — Progress Notes (Signed)
Patient ID: Nathan [name], male   DOB: 1933/09/13, 82 y.o.   MRN: [id number] Complaint:  Visit Type: Patient returns to my office for continued preventative foot care services. Complaint: Patient states" my nails have grown long and thick and become painful to walk and wear shoes" Patient has been diagnosed with DM with no foot complications. The patient presents for preventative foot care services. No changes to ROS  Podiatric Exam: Vascular: dorsalis pedis and posterior tibial pulses are palpable bilateral. Capillary return is immediate. Temperature gradient is WNL. Skin turgor WNL  Sensorium: Diminished  Semmes Weinstein monofilament test. Normal tactile sensation bilaterally. Nail Exam: Pt has thick disfigured discolored nails with subungual debris noted bilateral entire nail hallux through fifth toenails Ulcer Exam: There is no evidence of ulcer or pre-ulcerative changes or infection. Orthopedic Exam: Muscle tone and strength are WNL. No limitations in general ROM. No crepitus or effusions noted. Foot type and digits show no abnormalities. Bony prominences are unremarkable.Hammer toes second B/l Skin: No Porokeratosis. No infection or ulcers. Porokeratosis sub tibial sesamoid both feet.  Diagnosis:  Onychomycosis, , Pain in right toe, pain in left toes, Porokeratosis  Treatment & Plan Procedures and Treatment: Consent by patient was obtained for treatment procedures. The patient understood the discussion of treatment and procedures well. All questions were answered thoroughly reviewed. Debridement of mycotic and hypertrophic toenails, 1 through 5 bilateral and clearing of subungual debris. No ulceration, no infection noted.  Initiated diabetic shoe paperwork. For diabetic neuropathy , plantar callus and hammer toe 2 B/L. Return Visit-Office Procedure: Patient instructed to return to the office for a follow up visit 3 months for continued evaluation and treatment.

## 2015-11-05 DIAGNOSIS — N183 Chronic kidney disease, stage 3 (moderate): Secondary | ICD-10-CM | POA: Diagnosis not present

## 2015-11-05 DIAGNOSIS — Z23 Encounter for immunization: Secondary | ICD-10-CM | POA: Diagnosis not present

## 2015-11-05 DIAGNOSIS — Z6827 Body mass index (BMI) 27.0-27.9, adult: Secondary | ICD-10-CM | POA: Diagnosis not present

## 2015-11-05 DIAGNOSIS — I10 Essential (primary) hypertension: Secondary | ICD-10-CM | POA: Diagnosis not present

## 2015-11-05 DIAGNOSIS — E1129 Type 2 diabetes mellitus with other diabetic kidney complication: Secondary | ICD-10-CM | POA: Diagnosis not present

## 2015-11-19 DIAGNOSIS — L570 Actinic keratosis: Secondary | ICD-10-CM | POA: Diagnosis not present

## 2015-12-05 DIAGNOSIS — E784 Other hyperlipidemia: Secondary | ICD-10-CM | POA: Diagnosis not present

## 2015-12-05 DIAGNOSIS — N183 Chronic kidney disease, stage 3 (moderate): Secondary | ICD-10-CM | POA: Diagnosis not present

## 2015-12-05 DIAGNOSIS — E1129 Type 2 diabetes mellitus with other diabetic kidney complication: Secondary | ICD-10-CM | POA: Diagnosis not present

## 2015-12-05 DIAGNOSIS — Z125 Encounter for screening for malignant neoplasm of prostate: Secondary | ICD-10-CM | POA: Diagnosis not present

## 2015-12-10 DIAGNOSIS — N183 Chronic kidney disease, stage 3 (moderate): Secondary | ICD-10-CM | POA: Diagnosis not present

## 2015-12-10 DIAGNOSIS — I10 Essential (primary) hypertension: Secondary | ICD-10-CM | POA: Diagnosis not present

## 2015-12-10 DIAGNOSIS — I351 Nonrheumatic aortic (valve) insufficiency: Secondary | ICD-10-CM | POA: Diagnosis not present

## 2015-12-10 DIAGNOSIS — E784 Other hyperlipidemia: Secondary | ICD-10-CM | POA: Diagnosis not present

## 2015-12-10 DIAGNOSIS — E1129 Type 2 diabetes mellitus with other diabetic kidney complication: Secondary | ICD-10-CM | POA: Diagnosis not present

## 2015-12-10 DIAGNOSIS — Z0000 Encounter for general adult medical examination without abnormal findings: Secondary | ICD-10-CM | POA: Diagnosis not present

## 2015-12-10 DIAGNOSIS — Z6827 Body mass index (BMI) 27.0-27.9, adult: Secondary | ICD-10-CM | POA: Diagnosis not present

## 2015-12-10 DIAGNOSIS — I25810 Atherosclerosis of coronary artery bypass graft(s) without angina pectoris: Secondary | ICD-10-CM | POA: Diagnosis not present

## 2015-12-16 DIAGNOSIS — E1129 Type 2 diabetes mellitus with other diabetic kidney complication: Secondary | ICD-10-CM | POA: Diagnosis not present

## 2015-12-16 DIAGNOSIS — R1013 Epigastric pain: Secondary | ICD-10-CM | POA: Diagnosis not present

## 2015-12-16 DIAGNOSIS — Z6826 Body mass index (BMI) 26.0-26.9, adult: Secondary | ICD-10-CM | POA: Diagnosis not present

## 2015-12-16 DIAGNOSIS — I25810 Atherosclerosis of coronary artery bypass graft(s) without angina pectoris: Secondary | ICD-10-CM | POA: Diagnosis not present

## 2016-01-16 ENCOUNTER — Ambulatory Visit (INDEPENDENT_AMBULATORY_CARE_PROVIDER_SITE_OTHER): Payer: PPO | Admitting: Podiatry

## 2016-01-16 ENCOUNTER — Encounter: Payer: Self-pay | Admitting: Podiatry

## 2016-01-16 VITALS — Ht 72.0 in | Wt 192.0 lb

## 2016-01-16 DIAGNOSIS — E1142 Type 2 diabetes mellitus with diabetic polyneuropathy: Secondary | ICD-10-CM

## 2016-01-16 DIAGNOSIS — M79676 Pain in unspecified toe(s): Secondary | ICD-10-CM

## 2016-01-16 DIAGNOSIS — B351 Tinea unguium: Secondary | ICD-10-CM | POA: Diagnosis not present

## 2016-01-16 NOTE — Progress Notes (Signed)
Patient ID: Nathan [name], male   DOB: 09/25/1933, 82 y.o.   MRN: [id number] Complaint:  Visit Type: Patient returns to my office for continued preventative foot care services. Complaint: Patient states" my nails have grown long and thick and become painful to walk and wear shoes" Patient has been diagnosed with DM with no foot complications. The patient presents for preventative foot care services. No changes to ROS  Podiatric Exam: Vascular: dorsalis pedis and posterior tibial pulses are palpable bilateral. Capillary return is immediate. Temperature gradient is WNL. Skin turgor WNL  Sensorium: Diminished  Semmes Weinstein monofilament test. Normal tactile sensation bilaterally. Nail Exam: Pt has thick disfigured discolored nails with subungual debris noted bilateral entire nail hallux through fifth toenails Ulcer Exam: There is no evidence of ulcer or pre-ulcerative changes or infection. Orthopedic Exam: Muscle tone and strength are WNL. No limitations in general ROM. No crepitus or effusions noted. Foot type and digits show no abnormalities. Bony prominences are unremarkable.Hammer toes second B/l Skin: No Porokeratosis. No infection or ulcers. Porokeratosis sub tibial sesamoid both feet, presently asymptomatic.  Diagnosis:  Onychomycosis, , Pain in right toe, pain in left toes, Porokeratosis  Treatment & Plan Procedures and Treatment: Consent by patient was obtained for treatment procedures. The patient understood the discussion of treatment and procedures well. All questions were answered thoroughly reviewed. Debridement of mycotic and hypertrophic toenails, 1 through 5 bilateral and clearing of subungual debris. No ulceration, no infection noted.   Return Visit-Office Procedure: Patient instructed to return to the office for a follow up visit 3 months for continued evaluation and treatment.

## 2016-02-03 DIAGNOSIS — Z6826 Body mass index (BMI) 26.0-26.9, adult: Secondary | ICD-10-CM | POA: Diagnosis not present

## 2016-02-03 DIAGNOSIS — I10 Essential (primary) hypertension: Secondary | ICD-10-CM | POA: Diagnosis not present

## 2016-02-03 DIAGNOSIS — N183 Chronic kidney disease, stage 3 (moderate): Secondary | ICD-10-CM | POA: Diagnosis not present

## 2016-02-03 DIAGNOSIS — E1129 Type 2 diabetes mellitus with other diabetic kidney complication: Secondary | ICD-10-CM | POA: Diagnosis not present

## 2016-03-25 ENCOUNTER — Encounter: Payer: Self-pay | Admitting: Cardiovascular Disease

## 2016-03-30 ENCOUNTER — Ambulatory Visit (INDEPENDENT_AMBULATORY_CARE_PROVIDER_SITE_OTHER): Payer: PPO | Admitting: Cardiovascular Disease

## 2016-03-30 ENCOUNTER — Encounter: Payer: Self-pay | Admitting: Cardiovascular Disease

## 2016-03-30 ENCOUNTER — Encounter (INDEPENDENT_AMBULATORY_CARE_PROVIDER_SITE_OTHER): Payer: Self-pay

## 2016-03-30 VITALS — BP 120/78 | HR 65 | Ht 72.0 in | Wt 192.0 lb

## 2016-03-30 DIAGNOSIS — I2510 Atherosclerotic heart disease of native coronary artery without angina pectoris: Secondary | ICD-10-CM | POA: Diagnosis not present

## 2016-03-30 LAB — COMPREHENSIVE METABOLIC PANEL
ALBUMIN: 4 g/dL (ref 3.5–4.7)
ALK PHOS: 109 IU/L (ref 39–117)
ALT: 19 IU/L (ref 0–44)
AST: 19 IU/L (ref 0–40)
Albumin/Globulin Ratio: 1.3 (ref 1.2–2.2)
BUN / CREAT RATIO: 16 (ref 10–24)
BUN: 27 mg/dL (ref 8–27)
Bilirubin Total: 0.6 mg/dL (ref 0.0–1.2)
CHLORIDE: 101 mmol/L (ref 96–106)
CO2: 21 mmol/L (ref 18–29)
Calcium: 9.3 mg/dL (ref 8.6–10.2)
Creatinine, Ser: 1.72 mg/dL — ABNORMAL HIGH (ref 0.76–1.27)
GFR calc Af Amer: 42 mL/min/{1.73_m2} — ABNORMAL LOW (ref >59)
GFR calc non Af Amer: 37 mL/min/{1.73_m2} — ABNORMAL LOW (ref >59)
GLOBULIN, TOTAL: 3.1 g/dL (ref 1.5–4.5)
GLUCOSE: 209 mg/dL — AB (ref 65–99)
Potassium: 4.4 mmol/L (ref 3.5–5.2)
SODIUM: 140 mmol/L (ref 134–144)
Total Protein: 7.1 g/dL (ref 6.0–8.5)

## 2016-03-30 LAB — LIPID PANEL
CHOLESTEROL TOTAL: 168 mg/dL (ref 100–199)
Chol/HDL Ratio: 5.8 ratio units — ABNORMAL HIGH (ref 0.0–5.0)
HDL: 29 mg/dL — AB (ref >39)
LDL CALC: 72 mg/dL (ref 0–99)
Triglycerides: 336 mg/dL — ABNORMAL HIGH (ref 0–149)
VLDL CHOLESTEROL CAL: 67 mg/dL — AB (ref 5–40)

## 2016-03-30 NOTE — Progress Notes (Signed)
Nathan [name] Date of Birth  Apr 24, 1933       [facility]    [employer] [address], [place], [place]  [address]   [place], [place]  [address] [phone]     [phone]   Fax  [phone]    Fax [phone]  Problem List: 1. Coronary artery disease-status post CABG 2. Aortic insufficiency 3. Hypercholesterolemia 4. Diabetes mellitus 5. Mitral regurgitation  History of Present Illness:  Nathan [name] is doing very well. He's not had any episodes of chest pain or shortness of breath. 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  Dec. 2, 2014:  Nathan [name] is doing well.  Not as much energy.   No CP or dyspnea.    Dec. 3,2015:  Doing well from a cardiac standpoint.  started taking insulin recently.   Worked again for [name] over the summer and fall.   Dec. 2, 2016:  Doing well from a cardiac standpoint.   BP is high today , was normal yesterday   Feb. 14, 2017:   Nathan [name] is seen back for a visit He had acute cholecystitis and had a perc drain placed.  The drain has been removed.  He never had his GB removed.  Was placed on home O2 during the hospitalization  And has been on home O2 since He does not think he needs it.   He occasionally will forget to turn it on and cannot tell the difference.  Will check his O2 sate on RA. ( time off oxygen  1440)   During his hospitalization, he had an echocardiogram that reveals normal left ventricle systolic function. He did have mild pulmonary hypertension. He had a stress Myoview study which revealed normal left ventricular systolic function and revealed no ischemia.  Aug. 17, 2017:  Doing well. Had the gall bladder percutaneous drain removed. Did not have GB removed.  Stands on his feet all day , has some leg edema   Feb. 13, 2018:  Doing well .   No further gall bladder issues  Current Outpatient  Prescriptions on File Prior to Visit  Medication Sig Dispense Refill  . amLODipine (NORVASC) 5 MG tablet Take 5 mg by mouth daily.      [name] aspirin 81 MG tablet Take 81 mg by mouth daily. Reported on 03/11/2015    . atenolol (TENORMIN) 50 MG tablet Take 50 mg by mouth 2 (two) times daily.     [name] atorvastatin (LIPITOR) 40 MG tablet Take 40 mg by mouth daily.  3  . insulin NPH-regular Human (NOVOLIN 70/30) (70-30) 100 UNIT/ML injection Inject 22-36 Units into the skin See admin instructions. Take 36 units in the morning and 22 units at bedtime.    . multivitamin (THERAGRAN) per tablet Take 1 tablet by mouth daily.      . NON FORMULARY Place 2 L into the nose continuous.    . Omega-3 Fatty Acids (FISH OIL) 1200 MG CAPS Take 2 capsules by mouth daily.     [name] omeprazole (PRILOSEC) 20 MG capsule Take 20 mg by mouth daily.      No current facility-administered medications on file prior to visit.     No Known Allergies  Past Medical History:  Diagnosis Date  . Acid reflux   . Aortic insufficiency   . Cholecystitis   . Coronary artery disease 2002   CABG x5  .  Diabetes mellitus   . DVT (deep venous thrombosis) ([facility])    previously on coumadin  . Hypercholesteremia   . Hyperlipidemia   . Mitral valve regurgitation   . Skin cancer    tumor removed off of his back    Past Surgical History:  Procedure Laterality Date  . BILIARY DRAINAGE CATHETER PLACEMENT W/ BILE DUCT TUBE CHANGE  01/2015  . CORONARY ARTERY BYPASS GRAFT  12/21/2000   x5  . SKIN CANCER EXCISION    . TUMOR REMOVAL     off of back, skin cancer    History  Smoking Status  . Never Smoker  Smokeless Tobacco  . Never Used    History  Alcohol Use No    Family History  Problem Relation Age of Onset  . Heart attack Father   . Hypertension Mother   . Diabetes Brother   . Cancer Sister     breast  . Cancer Brother     throat & mouth    Reviw of Systems:  Reviewed in the HPI.  All other systems are  negative.  Physical Exam: Blood pressure 120/78, pulse 65, height 6' (1.829 m), weight 192 lb (87.1 kg). General: Well developed, well nourished, in no acute distress. Head: Normocephalic, atraumatic, sclera non-icteric, mucus membranes are moist,  Neck: Supple. Carotids are 2 + without bruits. No JVD Lungs: Clear bilaterally to auscultation. Heart: regular rate.  normal  S1 S2. No murmurs, gallops or rubs. Abdomen: Soft, non-tender, non-distended with normal bowel sounds. No hepatomegaly. No rebound/guarding. No masses. Msk:  Strength and tone are normal Extremities: No clubbing or cyanosis. 1+ bilateral edema. .  Distal pedal pulses are 2+ and equal bilaterally. Neuro: Alert and oriented X 3. Moves all extremities spontaneously. Psych:  Responds to questions appropriately with a normal affect.  ECG:  Mar 30, 2016:   NSR at 7 with 1st degree AV block RBBB No changes   Assessment / Plan:   1. Coronary artery disease-status post CABG-  No angina .   LV function is normal   . 2. Aortic insufficiency - stable   3. Hypercholesterolemia-  Doing well. Continue current medications.  Labs drawn this am .  4. Diabetes mellitus- managed by Dr. Philip Aspen   5. Mitral regurgitation  6. Leg edema -  Better     Return in 6 months   [name], MD  03/30/2016 7:51 AM    [place],  [place], [place]  [address] Pager [phone] Phone: [phone]; Fax: [phone]

## 2016-03-30 NOTE — Patient Instructions (Signed)

## 2016-04-08 DIAGNOSIS — E1129 Type 2 diabetes mellitus with other diabetic kidney complication: Secondary | ICD-10-CM | POA: Diagnosis not present

## 2016-04-08 DIAGNOSIS — I10 Essential (primary) hypertension: Secondary | ICD-10-CM | POA: Diagnosis not present

## 2016-04-08 DIAGNOSIS — Z6826 Body mass index (BMI) 26.0-26.9, adult: Secondary | ICD-10-CM | POA: Diagnosis not present

## 2016-04-08 DIAGNOSIS — I25810 Atherosclerosis of coronary artery bypass graft(s) without angina pectoris: Secondary | ICD-10-CM | POA: Diagnosis not present

## 2016-04-08 DIAGNOSIS — Z1389 Encounter for screening for other disorder: Secondary | ICD-10-CM | POA: Diagnosis not present

## 2016-04-08 DIAGNOSIS — I351 Nonrheumatic aortic (valve) insufficiency: Secondary | ICD-10-CM | POA: Diagnosis not present

## 2016-04-16 ENCOUNTER — Encounter: Payer: Self-pay | Admitting: Podiatry

## 2016-04-16 ENCOUNTER — Ambulatory Visit (INDEPENDENT_AMBULATORY_CARE_PROVIDER_SITE_OTHER): Payer: PPO | Admitting: Podiatry

## 2016-04-16 VITALS — Ht 72.0 in | Wt 192.0 lb

## 2016-04-16 DIAGNOSIS — E1142 Type 2 diabetes mellitus with diabetic polyneuropathy: Secondary | ICD-10-CM

## 2016-04-16 DIAGNOSIS — B351 Tinea unguium: Secondary | ICD-10-CM | POA: Diagnosis not present

## 2016-04-16 DIAGNOSIS — M79676 Pain in unspecified toe(s): Secondary | ICD-10-CM

## 2016-04-16 NOTE — Progress Notes (Signed)
Patient ID: Nathan [name], male   DOB: 07/03/1933, 83 y.o.   MRN: [id number] Complaint:  Visit Type: Patient returns to my office for continued preventative foot care services. Complaint: Patient states" my nails have grown long and thick and become painful to walk and wear shoes" Patient has been diagnosed with DM with no foot complications. The patient presents for preventative foot care services. No changes to ROS  Podiatric Exam: Vascular: dorsalis pedis and posterior tibial pulses are palpable bilateral. Capillary return is immediate. Temperature gradient is WNL. Skin turgor WNL  Sensorium: Diminished  Semmes Weinstein monofilament test. Normal tactile sensation bilaterally. Nail Exam: Pt has thick disfigured discolored nails with subungual debris noted bilateral entire nail hallux through fifth toenails Ulcer Exam: There is no evidence of ulcer or pre-ulcerative changes or infection. Orthopedic Exam: Muscle tone and strength are WNL. No limitations in general ROM. No crepitus or effusions noted. Foot type and digits show no abnormalities. Bony prominences are unremarkable.Hammer toes second B/l Skin: No Porokeratosis. No infection or ulcers. Porokeratosis sub tibial sesamoid both feet, presently asymptomatic.  Diagnosis:  Onychomycosis, , Pain in right toe, pain in left toes  Treatment & Plan Procedures and Treatment: Consent by patient was obtained for treatment procedures. The patient understood the discussion of treatment and procedures well. All questions were answered thoroughly reviewed. Debridement of mycotic and hypertrophic toenails, 1 through 5 bilateral and clearing of subungual debris. No ulceration, no infection noted. Patient desires diabetic shoes next visit.  Return Visit-Office Procedure: Patient instructed to return to the office for a follow up visit 3 months for continued evaluation and treatment.

## 2016-05-01 IMAGING — CT CT ABD-PELV W/ CM
2 of 5 series · 15 of 46 positions shown, 17 images · IV contrast (Omni 300)
Comparison: CT of the abdomen and pelvis from 01/21/2015

CLINICAL DATA: Acute onset of right-sided abdominal pain at the
site of the biliary catheter. Assess for underlying abscess. Initial
encounter.

EXAM:
CT ABDOMEN AND PELVIS WITH CONTRAST
TECHNIQUE: Multidetector CT imaging of the abdomen and pelvis was performed
using the standard protocol following bolus administration of
intravenous contrast.
CONTRAST:  80mL OMNIPAQUE IOHEXOL 300 MG/ML  SOLN

[Series 2: a/p w/ 5mm · axial · 0.83mm/px · z∈[-474,+26]mm · 12 of 112 slices shown, 14 images]
[im 6/112  soft-tissue]
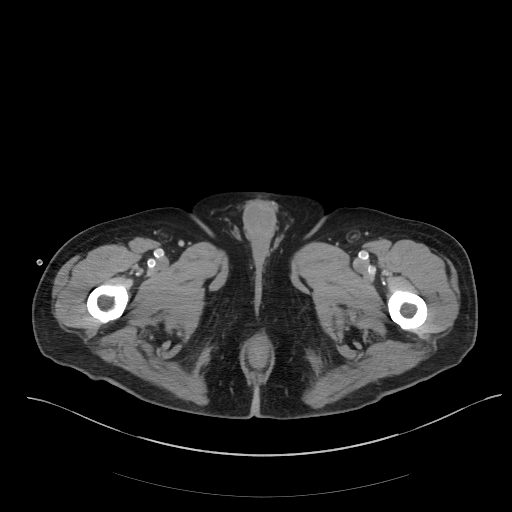
[im 6/112  bone]
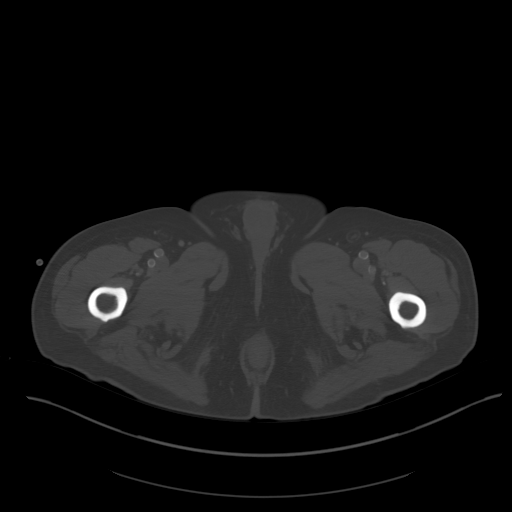
[im 18/112  soft-tissue]
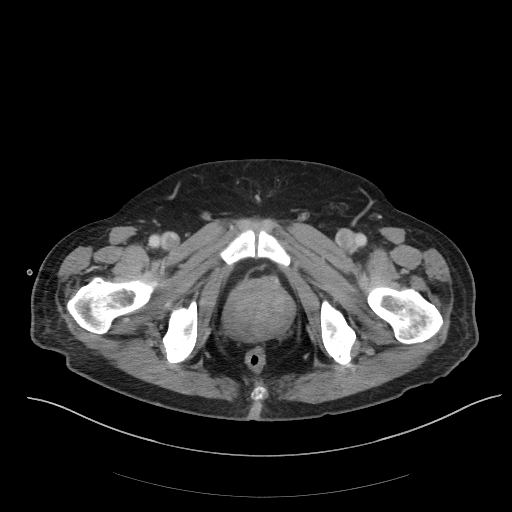
[im 24/112  soft-tissue]
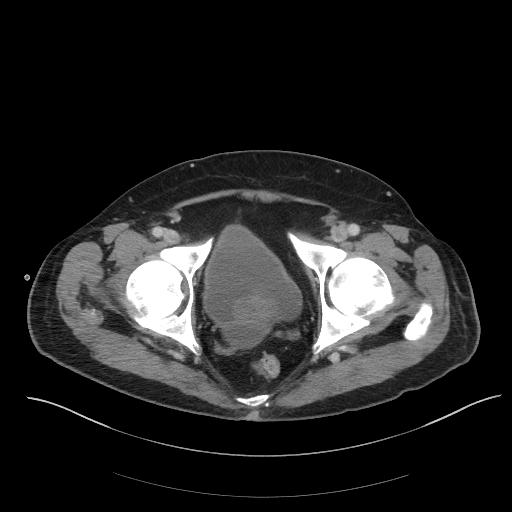
[im 36/112  soft-tissue]
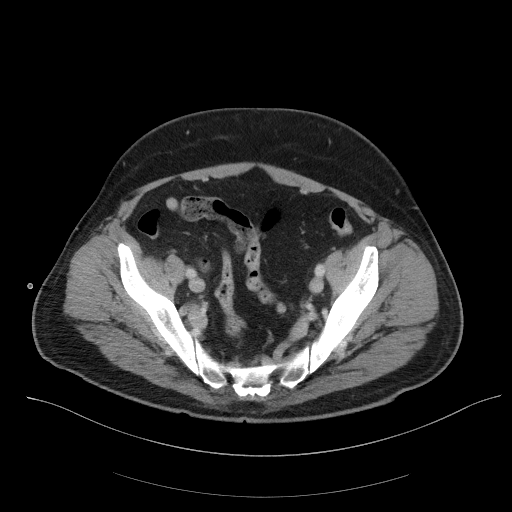
[im 41/112  soft-tissue]
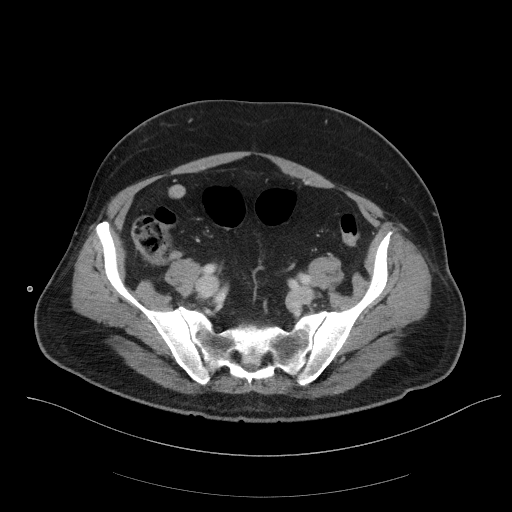
[im 53/112  soft-tissue]
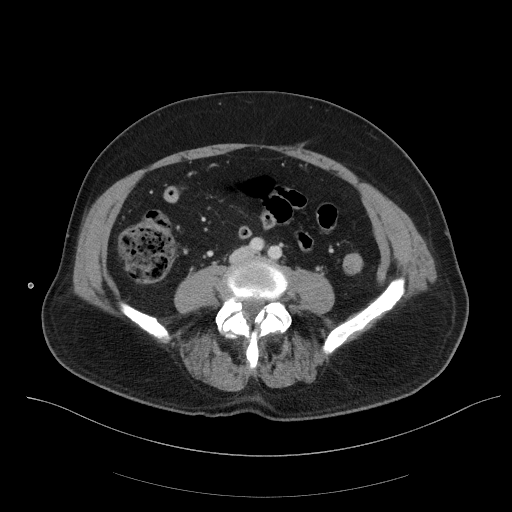
[im 59/112  soft-tissue]
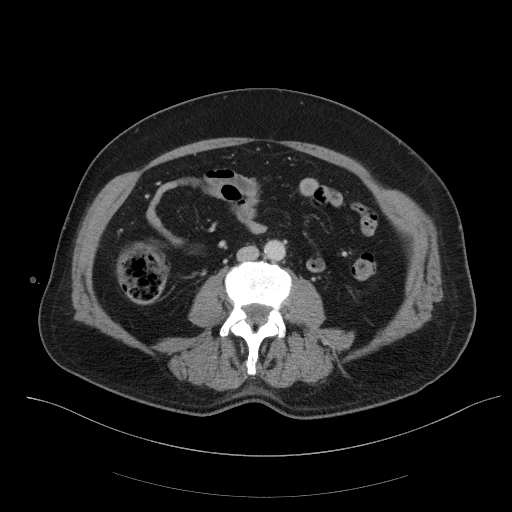
[im 71/112  soft-tissue]
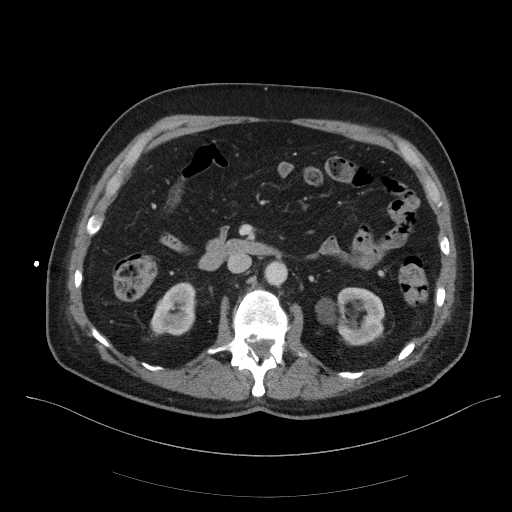
[im 76/112  soft-tissue]
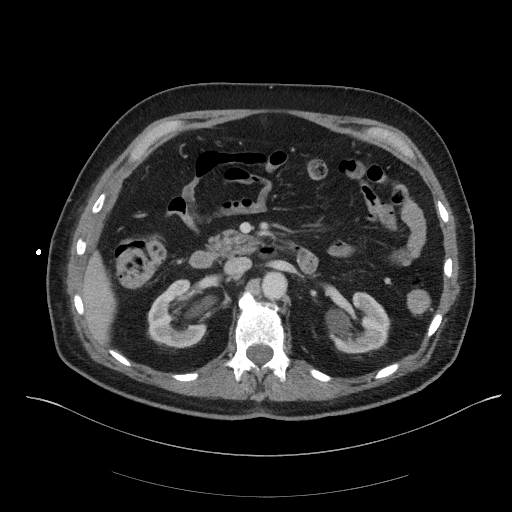
[im 76/112  bone]
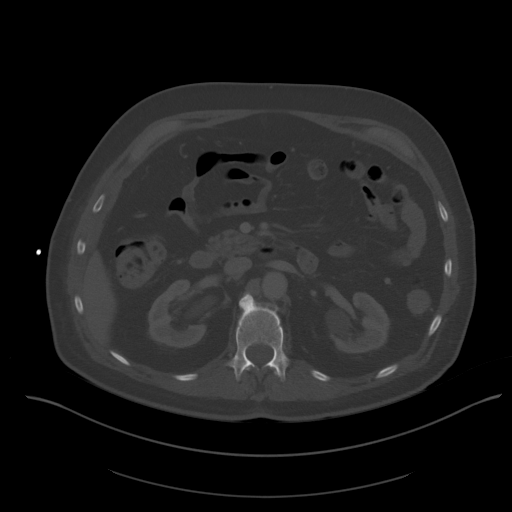
[im 88/112  soft-tissue]
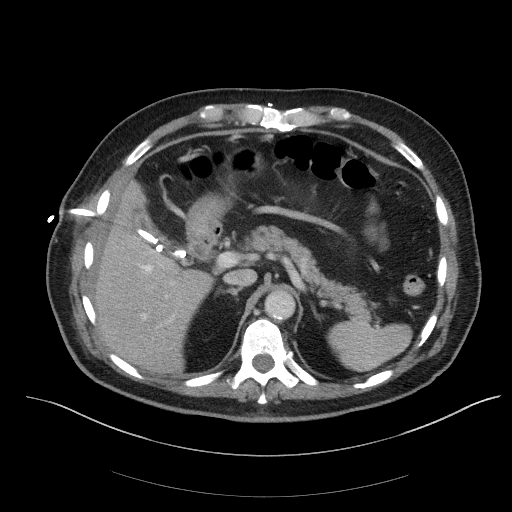
[im 94/112  soft-tissue]
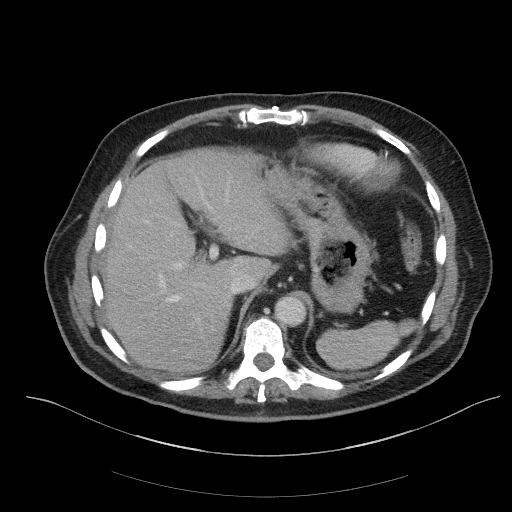
[im 106/112  soft-tissue]
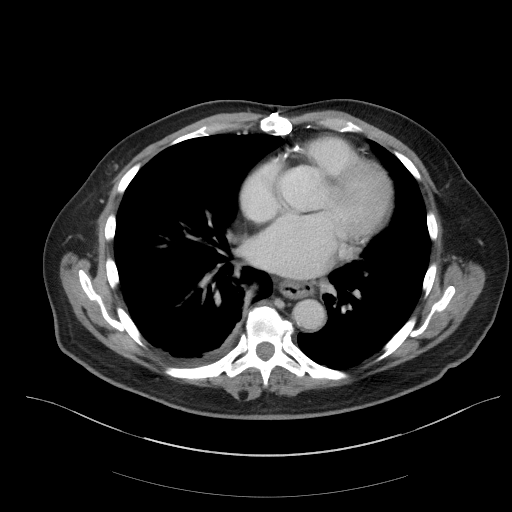

[Series 5: a/p w/ cor · coronal · 0.90mm/px · 3 of 133 slices shown]
[im 45/133  soft-tissue]
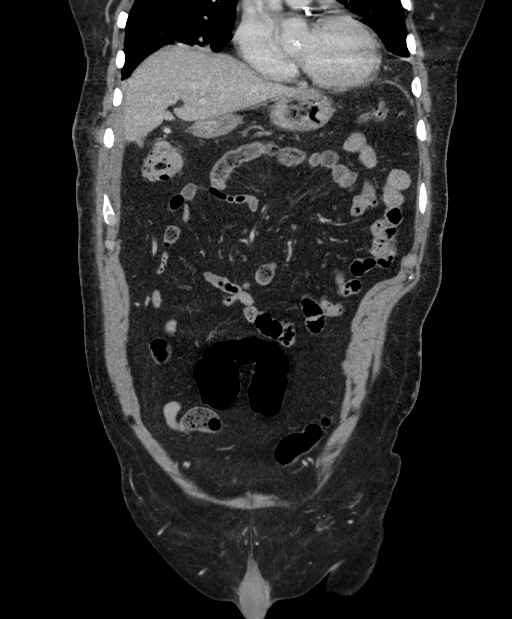
[im 59/133  soft-tissue]
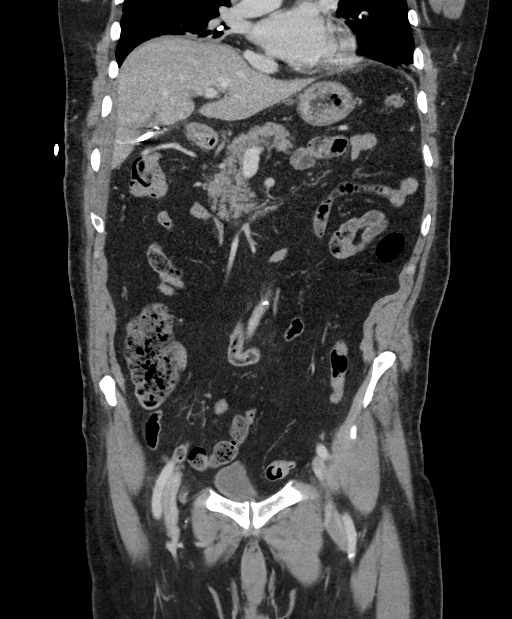
[im 74/133  soft-tissue]
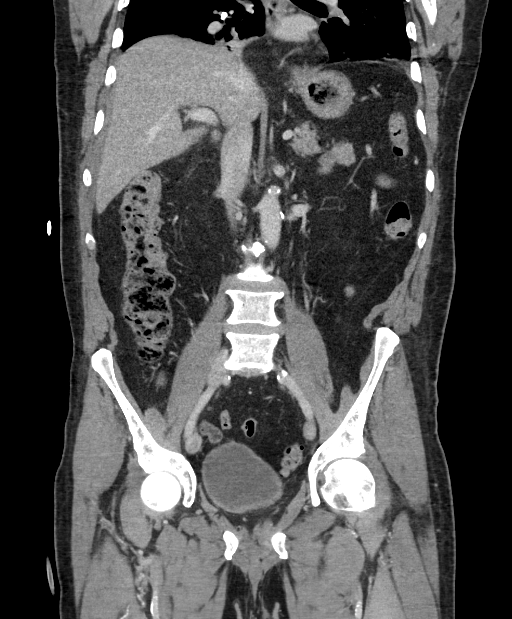

[15 of 46 positions shown; findings below may reference images not displayed]

FINDINGS: A trace right pleural effusion is noted. Mild bibasilar opacities
likely reflect atelectasis. Diffuse coronary artery calcifications
are seen. A few scattered mediastinal and right hilar calcified
nodes likely reflect remote granulomatous disease.

Trace fluid is noted tracking overlying the right hepatic lobe,
without a defined abscess. There is mild associated soft tissue
edema along the right lateral abdominal wall. The course of the
patient's biliary drainage catheter is otherwise unremarkable. The
gallbladder is decompressed and grossly unremarkable in appearance.

The liver and spleen are unremarkable in appearance. The pancreas
and adrenal glands are within normal limits.

Mild bilateral renal pelvicaliectasis remains within normal limits.
Nonspecific perinephric stranding is noted bilaterally. There is no
evidence of hydronephrosis. No renal or ureteral stones are seen.

No free fluid is identified. The small bowel is unremarkable in
appearance. The stomach is within normal limits. No acute vascular
abnormalities are seen. Mild calcification is noted along the
abdominal aorta and its branches.

The appendix is normal in caliber, without evidence of appendicitis.
Minimal diverticulosis is noted along the sigmoid colon, without
evidence of diverticulitis.

The bladder is mildly distended and grossly unremarkable. The
prostate is enlarged, measuring 6.2 cm in transverse dimension. No
inguinal lymphadenopathy is seen.

No acute osseous abnormalities are identified. There is grade 2
anterolisthesis of L5 on S1, reflecting underlying chronic bilateral
pars defects at L5. Mild scattered underlying degenerative osseous
fragments are seen.
IMPRESSION: 1. Trace fluid tracking overlying the right hepatic lobe, without a
defined abscess. Mild associated soft tissue edema along the right
lateral abdominal wall. No additional free fluid seen. The biliary
drainage catheter is otherwise unremarkable in appearance. The
gallbladder is decompressed.
2. Trace right pleural effusion. Mild bibasilar airspace opacities
likely reflect atelectasis.
3. Enlarged prostate noted.  Would consider correlation with PSA.
4. Diffuse coronary artery calcifications seen.
5. Mild calcification along the abdominal aorta and its branches.
6. Minimal diverticulosis along the sigmoid colon without evidence
of diverticulitis.
7. Grade 2 anterolisthesis of L5 on S1, reflecting underlying
chronic bilateral pars defects at L5. Mild scattered underlying
degenerative osseous fragments seen.

## 2016-05-11 DIAGNOSIS — I10 Essential (primary) hypertension: Secondary | ICD-10-CM | POA: Diagnosis not present

## 2016-05-11 DIAGNOSIS — Z6826 Body mass index (BMI) 26.0-26.9, adult: Secondary | ICD-10-CM | POA: Diagnosis not present

## 2016-05-11 DIAGNOSIS — E1129 Type 2 diabetes mellitus with other diabetic kidney complication: Secondary | ICD-10-CM | POA: Diagnosis not present

## 2016-05-11 DIAGNOSIS — N183 Chronic kidney disease, stage 3 (moderate): Secondary | ICD-10-CM | POA: Diagnosis not present

## 2016-05-19 DIAGNOSIS — L570 Actinic keratosis: Secondary | ICD-10-CM | POA: Diagnosis not present

## 2016-05-19 DIAGNOSIS — D485 Neoplasm of uncertain behavior of skin: Secondary | ICD-10-CM | POA: Diagnosis not present

## 2016-06-23 DIAGNOSIS — H5203 Hypermetropia, bilateral: Secondary | ICD-10-CM | POA: Diagnosis not present

## 2016-06-23 DIAGNOSIS — Z794 Long term (current) use of insulin: Secondary | ICD-10-CM | POA: Diagnosis not present

## 2016-06-23 DIAGNOSIS — Z961 Presence of intraocular lens: Secondary | ICD-10-CM | POA: Diagnosis not present

## 2016-06-23 DIAGNOSIS — H52223 Regular astigmatism, bilateral: Secondary | ICD-10-CM | POA: Diagnosis not present

## 2016-06-23 DIAGNOSIS — E119 Type 2 diabetes mellitus without complications: Secondary | ICD-10-CM | POA: Diagnosis not present

## 2016-07-16 ENCOUNTER — Ambulatory Visit (INDEPENDENT_AMBULATORY_CARE_PROVIDER_SITE_OTHER): Payer: PPO | Admitting: Podiatry

## 2016-07-16 DIAGNOSIS — M79676 Pain in unspecified toe(s): Secondary | ICD-10-CM | POA: Diagnosis not present

## 2016-07-16 DIAGNOSIS — M2041 Other hammer toe(s) (acquired), right foot: Secondary | ICD-10-CM

## 2016-07-16 DIAGNOSIS — B351 Tinea unguium: Secondary | ICD-10-CM | POA: Diagnosis not present

## 2016-07-16 DIAGNOSIS — E1142 Type 2 diabetes mellitus with diabetic polyneuropathy: Secondary | ICD-10-CM

## 2016-07-16 DIAGNOSIS — M2042 Other hammer toe(s) (acquired), left foot: Secondary | ICD-10-CM

## 2016-07-16 NOTE — Progress Notes (Signed)
Patient ID: Nathan [name], male   DOB: October 19, 1933, 83 y.o.   MRN: [id number] Complaint:  Visit Type: Patient returns to my office for continued preventative foot care services. Complaint: Patient states" my nails have grown long and thick and become painful to walk and wear shoes" Patient has been diagnosed with DM with no foot complications. The patient presents for preventative foot care services. No changes to ROS  Podiatric Exam: Vascular: dorsalis pedis and posterior tibial pulses are palpable bilateral. Capillary return is immediate. Temperature gradient is WNL. Skin turgor WNL  Sensorium: Diminished  Semmes Weinstein monofilament test. Normal tactile sensation bilaterally. Nail Exam: Pt has thick disfigured discolored nails with subungual debris noted bilateral entire nail hallux through fifth toenails Ulcer Exam: There is no evidence of ulcer or pre-ulcerative changes or infection. Orthopedic Exam: Muscle tone and strength are WNL. No limitations in general ROM. No crepitus or effusions noted. Foot type and digits show no abnormalities. Bony prominences are unremarkable.Hammer toes second B/l Skin: No Porokeratosis. No infection or ulcers. Porokeratosis sub tibial sesamoid both feet, presently asymptomatic.  Diagnosis:  Onychomycosis, , Pain in right toe, pain in left toes  Treatment & Plan Procedures and Treatment: Consent by patient was obtained for treatment procedures. The patient understood the discussion of treatment and procedures well. All questions were answered thoroughly reviewed. Debridement of mycotic and hypertrophic toenails, 1 through 5 bilateral and clearing of subungual debris. No ulceration, no infection noted. Patient desires diabetic shoes next visit.  Return Visit-Office Procedure: Patient instructed to return to the office for a follow up visit 3 months for continued evaluation and treatment.   Gardiner Barefoot DPM

## 2016-08-05 DIAGNOSIS — N183 Chronic kidney disease, stage 3 (moderate): Secondary | ICD-10-CM | POA: Diagnosis not present

## 2016-08-05 DIAGNOSIS — Z6827 Body mass index (BMI) 27.0-27.9, adult: Secondary | ICD-10-CM | POA: Diagnosis not present

## 2016-08-05 DIAGNOSIS — I10 Essential (primary) hypertension: Secondary | ICD-10-CM | POA: Diagnosis not present

## 2016-08-05 DIAGNOSIS — I351 Nonrheumatic aortic (valve) insufficiency: Secondary | ICD-10-CM | POA: Diagnosis not present

## 2016-08-05 DIAGNOSIS — E1129 Type 2 diabetes mellitus with other diabetic kidney complication: Secondary | ICD-10-CM | POA: Diagnosis not present

## 2016-08-05 DIAGNOSIS — I25810 Atherosclerosis of coronary artery bypass graft(s) without angina pectoris: Secondary | ICD-10-CM | POA: Diagnosis not present

## 2016-08-05 DIAGNOSIS — E784 Other hyperlipidemia: Secondary | ICD-10-CM | POA: Diagnosis not present

## 2016-09-28 DIAGNOSIS — L821 Other seborrheic keratosis: Secondary | ICD-10-CM | POA: Diagnosis not present

## 2016-09-28 DIAGNOSIS — L570 Actinic keratosis: Secondary | ICD-10-CM | POA: Diagnosis not present

## 2016-09-28 DIAGNOSIS — D225 Melanocytic nevi of trunk: Secondary | ICD-10-CM | POA: Diagnosis not present

## 2016-09-28 DIAGNOSIS — D485 Neoplasm of uncertain behavior of skin: Secondary | ICD-10-CM | POA: Diagnosis not present

## 2016-09-28 DIAGNOSIS — L820 Inflamed seborrheic keratosis: Secondary | ICD-10-CM | POA: Diagnosis not present

## 2016-10-05 DIAGNOSIS — I10 Essential (primary) hypertension: Secondary | ICD-10-CM | POA: Diagnosis not present

## 2016-10-05 DIAGNOSIS — N183 Chronic kidney disease, stage 3 (moderate): Secondary | ICD-10-CM | POA: Diagnosis not present

## 2016-10-05 DIAGNOSIS — E1129 Type 2 diabetes mellitus with other diabetic kidney complication: Secondary | ICD-10-CM | POA: Diagnosis not present

## 2016-10-19 ENCOUNTER — Encounter: Payer: Self-pay | Admitting: Podiatry

## 2016-10-19 ENCOUNTER — Ambulatory Visit (INDEPENDENT_AMBULATORY_CARE_PROVIDER_SITE_OTHER): Payer: PPO | Admitting: Podiatry

## 2016-10-19 DIAGNOSIS — B351 Tinea unguium: Secondary | ICD-10-CM

## 2016-10-19 DIAGNOSIS — L84 Corns and callosities: Secondary | ICD-10-CM

## 2016-10-19 DIAGNOSIS — M79676 Pain in unspecified toe(s): Secondary | ICD-10-CM

## 2016-10-19 DIAGNOSIS — M2041 Other hammer toe(s) (acquired), right foot: Secondary | ICD-10-CM

## 2016-10-19 DIAGNOSIS — M2042 Other hammer toe(s) (acquired), left foot: Secondary | ICD-10-CM

## 2016-10-19 DIAGNOSIS — E1142 Type 2 diabetes mellitus with diabetic polyneuropathy: Secondary | ICD-10-CM

## 2016-10-19 NOTE — Progress Notes (Signed)
Patient ID: Nathan [name], male   DOB: 09-26-1933, 83 y.o.   MRN: [id number] Complaint:  Visit Type: Patient returns to my office for continued preventative foot care services. Complaint: Patient states" my nails have grown long and thick and become painful to walk and wear shoes" Patient has been diagnosed with DM with no foot complications. The patient presents for preventative foot care services. No changes to ROS  Podiatric Exam: Vascular: dorsalis pedis and posterior tibial pulses are palpable bilateral. Capillary return is immediate. Temperature gradient is WNL. Skin turgor WNL  Sensorium: Diminished  Semmes Weinstein monofilament test. Normal tactile sensation bilaterally. Nail Exam: Pt has thick disfigured discolored nails with subungual debris noted bilateral entire nail hallux through fifth toenails Ulcer Exam: There is no evidence of ulcer or pre-ulcerative changes or infection. Orthopedic Exam: Muscle tone and strength are WNL. No limitations in general ROM. No crepitus or effusions noted. Foot type and digits show no abnormalities. Bony prominences are unremarkable.Hammer toes second B/l Skin: No Porokeratosis. No infection or ulcers. Porokeratosis sub tibial sesamoid both feet, presently asymptomatic.  Diagnosis:  Onychomycosis, , Pain in right toe, pain in left toes  Treatment & Plan Procedures and Treatment: Consent by patient was obtained for treatment procedures. The patient understood the discussion of treatment and procedures well. All questions were answered thoroughly reviewed. Debridement of mycotic and hypertrophic toenails, 1 through 5 bilateral and clearing of subungual debris. No ulceration, no infection noted. Patient appointed with Liliane Channel for diabetic shoes. Return Visit-Office Procedure: Patient instructed to return to the office for a follow up visit 3 months for continued evaluation and treatment.   Gardiner Barefoot DPM

## 2016-10-29 ENCOUNTER — Encounter: Payer: Self-pay | Admitting: Cardiovascular Disease

## 2016-11-10 ENCOUNTER — Encounter: Payer: Self-pay | Admitting: Cardiovascular Disease

## 2016-11-10 ENCOUNTER — Ambulatory Visit (INDEPENDENT_AMBULATORY_CARE_PROVIDER_SITE_OTHER): Payer: PPO | Admitting: Cardiovascular Disease

## 2016-11-10 VITALS — BP 148/70 | HR 62 | Ht 72.0 in | Wt 200.8 lb

## 2016-11-10 DIAGNOSIS — I351 Nonrheumatic aortic (valve) insufficiency: Secondary | ICD-10-CM

## 2016-11-10 DIAGNOSIS — I10 Essential (primary) hypertension: Secondary | ICD-10-CM

## 2016-11-10 DIAGNOSIS — I2510 Atherosclerotic heart disease of native coronary artery without angina pectoris: Secondary | ICD-10-CM

## 2016-11-10 LAB — HEPATIC FUNCTION PANEL
ALT: 18 IU/L (ref 0–44)
AST: 20 IU/L (ref 0–40)
Albumin: 3.7 g/dL (ref 3.5–4.7)
Alkaline Phosphatase: 80 IU/L (ref 39–117)
BILIRUBIN, DIRECT: 0.37 mg/dL (ref 0.00–0.40)
Bilirubin Total: 1.2 mg/dL (ref 0.0–1.2)
TOTAL PROTEIN: 6.7 g/dL (ref 6.0–8.5)

## 2016-11-10 LAB — BASIC METABOLIC PANEL
BUN / CREAT RATIO: 15 (ref 10–24)
BUN: 25 mg/dL (ref 8–27)
CO2: 19 mmol/L — ABNORMAL LOW (ref 20–29)
CREATININE: 1.68 mg/dL — AB (ref 0.76–1.27)
Calcium: 8.8 mg/dL (ref 8.6–10.2)
Chloride: 104 mmol/L (ref 96–106)
GFR calc Af Amer: 44 mL/min/{1.73_m2} — ABNORMAL LOW (ref >59)
GFR, EST NON AFRICAN AMERICAN: 38 mL/min/{1.73_m2} — AB (ref >59)
GLUCOSE: 120 mg/dL — AB (ref 65–99)
Potassium: 4.1 mmol/L (ref 3.5–5.2)
SODIUM: 141 mmol/L (ref 134–144)

## 2016-11-10 LAB — LIPID PANEL
CHOLESTEROL TOTAL: 143 mg/dL (ref 100–199)
Chol/HDL Ratio: 4 ratio (ref 0.0–5.0)
HDL: 36 mg/dL — ABNORMAL LOW (ref >39)
LDL CALC: 81 mg/dL (ref 0–99)
Triglycerides: 128 mg/dL (ref 0–149)
VLDL CHOLESTEROL CAL: 26 mg/dL (ref 5–40)

## 2016-11-10 MED ORDER — HYDROCHLOROTHIAZIDE 25 MG PO TABS: 25.0000 mg | ORAL_TABLET | Freq: Every day | ORAL | 3 refills | Status: DC

## 2016-11-10 MED ORDER — POTASSIUM CHLORIDE ER 10 MEQ PO TBCR: 10.0000 meq | EXTENDED_RELEASE_TABLET | Freq: Every day | ORAL | 3 refills | Status: DC

## 2016-11-10 NOTE — Patient Instructions (Signed)
Medication Instructions:  START HCTZ (Hydrochlorothiazide) 25 mg once daily START Kdur (Potassium supplement) 10 meq once daily   Labwork: TODAY - cholesterol, liver panel, basic metabolic panel  Your physician recommends that you return for lab work in: 3 weeks for basic metabolic panel   Testing/Procedures: None Ordered   Follow-Up: Your physician wants you to follow-up in: 6 months with Dr. Acie Fredrickson.  You will receive a reminder letter in the mail two months in advance. If you don't receive a letter, please call our office to schedule the follow-up appointment.   If you need a refill on your cardiac medications before your next appointment, please call your pharmacy.   Thank you for choosing [facility]! Christen Bame, RN [phone]

## 2016-11-10 NOTE — Progress Notes (Signed)
Nathan [name] Date of Birth  1933-08-22       [facility]    [employer] [address], [place], [place]  [address]   [place],   [address] [phone]     [phone]   Fax  [phone]    Fax [phone]  Problem List: 1. Coronary artery disease-status post CABG 2. Aortic insufficiency 3. Hypercholesterolemia 4. Diabetes mellitus 5. Mitral regurgitation  History of Present Illness:  Nathan [name] is doing very well. He's not had any episodes of chest pain or shortness of breath. 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  Dec. 2, 2014:  Nathan [name] is doing well.  Not as much energy.   No CP or dyspnea.    Dec. 3,2015:  Doing well from a cardiac standpoint.  started taking insulin recently.   Worked again for [name].   Dec. 2, 2016:  Doing well from a cardiac standpoint.   BP is high today , was normal yesterday   Feb. 14, 2017:   Nathan [name] is seen back for a visit BP is a bit elevated today .   Eats lots of salty foods ( soup for dinner) He had acute cholecystitis and had a perc drain placed.  The drain has been removed.  He never had his GB removed.  Was placed on home O2 during the hospitalization  And has been on home O2 since He does not think he needs it.   He occasionally will forget to turn it on and cannot tell the difference.  Will check his O2 sate on RA. ( time off oxygen  1440)   During his hospitalization, he had an echocardiogram that reveals normal left ventricle systolic function. He did have mild pulmonary hypertension. He had a stress Myoview study which revealed normal left ventricular systolic function and revealed no ischemia.  Aug. 17, 2017:  Doing well. Had the gall bladder percutaneous drain removed. Did not have GB removed.  Stands on his feet all day , has some leg edema   Feb. 13, 2018:  Doing  well .   No further gall bladder issues  Sept.  26, 2018:  Nathan [name] is seen today for follow-up of his coronary artery disease. Ulcer has a history of essential hypertension. Current Outpatient Prescriptions on File Prior to Visit  Medication Sig Dispense Refill  . amLODipine (NORVASC) 5 MG tablet Take 5 mg by mouth daily.      [name] aspirin 81 MG tablet Take 81 mg by mouth daily. Reported on 03/11/2015    . atenolol (TENORMIN) 50 MG tablet Take 50 mg by mouth 2 (two) times daily.     [name] atorvastatin (LIPITOR) 40 MG tablet Take 40 mg by mouth daily.  3  . insulin NPH-regular Human (NOVOLIN 70/30) (70-30) 100 UNIT/ML injection Inject 22-36 Units into the skin See admin instructions. Take 36 units in the morning and 22 units at bedtime.    . multivitamin (THERAGRAN) per tablet Take 1 tablet by mouth daily.      . NON FORMULARY Place 2 L into the nose continuous.    . Omega-3 Fatty Acids (FISH OIL) 1200 MG CAPS Take 2 capsules by mouth daily.     [name] omeprazole (PRILOSEC) 20 MG capsule Take 20 mg by mouth daily.      No current facility-administered medications on file  prior to visit.     No Known Allergies  Past Medical History:  Diagnosis Date  . Acid reflux   . Aortic insufficiency   . Cholecystitis   . Coronary artery disease 2002   CABG x5  . Diabetes mellitus   . DVT (deep venous thrombosis) (HCC)    previously on coumadin  . Hypercholesteremia   . Hyperlipidemia   . Mitral valve regurgitation   . Skin cancer    tumor removed off of his back    Past Surgical History:  Procedure Laterality Date  . BILIARY DRAINAGE CATHETER PLACEMENT W/ BILE DUCT TUBE CHANGE  01/2015  . CORONARY ARTERY BYPASS GRAFT  12/21/2000   x5  . SKIN CANCER EXCISION    . TUMOR REMOVAL     off of back, skin cancer    History  Smoking Status  . Never Smoker  Smokeless Tobacco  . Never Used    History  Alcohol Use No    Family History  Problem Relation Age of Onset  . Heart attack Father   .  Hypertension Mother   . Diabetes Brother   . Cancer Sister        breast  . Cancer Brother        throat & mouth    Reviw of Systems:  Reviewed in the HPI.  All other systems are negative.  Physical Exam: Physical Exam: Blood pressure (!) 148/70, pulse 62, height 6' (1.829 m), weight 200 lb 12.8 oz (91.1 kg), SpO2 94 %.  GEN:  Well nourished, well developed in no acute distress HEENT: Normal NECK: No JVD; No carotid bruits LYMPHATICS: No lymphadenopathy CARDIAC: RR, no significant murmur rubs, gallops RESPIRATORY:  Clear to auscultation without rales, wheezing or rhonchi  ABDOMEN: Soft, non-tender, non-distended MUSCULOSKELETAL:  No edema; No deformity  SKIN: Warm and dry NEUROLOGIC:  Alert and oriented x 3  ECG:     Assessment / Plan:   1. Coronary artery disease-he's doing well status post coronary artery bypass grafting. He's not had any episodes of angina.   2. Aortic insufficiency - stable. He denies any severe shortness breath.   3. Hypercholesterolemia-  will draw fasting lab work today.  Continue atorvastatin.  4. Diabetes mellitus- managed by his primary doctor-Dr. Sharlett Iles   5. Mitral regurgitation -  Stable  6. Leg edema -  Better    Return in 6 months   [name], MD  11/10/2016 8:06 AM    [facility] [place],  [place], [place]  [address] Pager [phone] Phone: [phone]; Fax: [phone]

## 2016-11-11 DIAGNOSIS — I10 Essential (primary) hypertension: Secondary | ICD-10-CM | POA: Insufficient documentation

## 2016-11-16 ENCOUNTER — Other Ambulatory Visit: Payer: PPO | Admitting: Orthotics

## 2016-12-02 ENCOUNTER — Encounter (INDEPENDENT_AMBULATORY_CARE_PROVIDER_SITE_OTHER): Payer: Self-pay

## 2016-12-02 ENCOUNTER — Other Ambulatory Visit: Payer: PPO | Admitting: *Deleted

## 2016-12-02 DIAGNOSIS — I10 Essential (primary) hypertension: Secondary | ICD-10-CM

## 2016-12-02 DIAGNOSIS — I2510 Atherosclerotic heart disease of native coronary artery without angina pectoris: Secondary | ICD-10-CM | POA: Diagnosis not present

## 2016-12-02 LAB — BASIC METABOLIC PANEL
BUN/Creatinine Ratio: 19 (ref 10–24)
BUN: 34 mg/dL — ABNORMAL HIGH (ref 8–27)
CO2: 21 mmol/L (ref 20–29)
CREATININE: 1.8 mg/dL — AB (ref 0.76–1.27)
Calcium: 9 mg/dL (ref 8.6–10.2)
Chloride: 103 mmol/L (ref 96–106)
GFR calc Af Amer: 40 mL/min/{1.73_m2} — ABNORMAL LOW (ref >59)
GFR, EST NON AFRICAN AMERICAN: 35 mL/min/{1.73_m2} — AB (ref >59)
Glucose: 140 mg/dL — ABNORMAL HIGH (ref 65–99)
POTASSIUM: 4 mmol/L (ref 3.5–5.2)
SODIUM: 139 mmol/L (ref 134–144)

## 2016-12-09 DIAGNOSIS — Z125 Encounter for screening for malignant neoplasm of prostate: Secondary | ICD-10-CM | POA: Diagnosis not present

## 2016-12-09 DIAGNOSIS — E1129 Type 2 diabetes mellitus with other diabetic kidney complication: Secondary | ICD-10-CM | POA: Diagnosis not present

## 2016-12-09 DIAGNOSIS — N183 Chronic kidney disease, stage 3 (moderate): Secondary | ICD-10-CM | POA: Diagnosis not present

## 2016-12-09 DIAGNOSIS — I80202 Phlebitis and thrombophlebitis of unspecified deep vessels of left lower extremity: Secondary | ICD-10-CM | POA: Diagnosis not present

## 2016-12-09 DIAGNOSIS — Z0000 Encounter for general adult medical examination without abnormal findings: Secondary | ICD-10-CM | POA: Diagnosis not present

## 2016-12-16 DIAGNOSIS — E1121 Type 2 diabetes mellitus with diabetic nephropathy: Secondary | ICD-10-CM | POA: Diagnosis not present

## 2016-12-16 DIAGNOSIS — Z23 Encounter for immunization: Secondary | ICD-10-CM | POA: Diagnosis not present

## 2016-12-16 DIAGNOSIS — N183 Chronic kidney disease, stage 3 (moderate): Secondary | ICD-10-CM | POA: Diagnosis not present

## 2016-12-16 DIAGNOSIS — I351 Nonrheumatic aortic (valve) insufficiency: Secondary | ICD-10-CM | POA: Diagnosis not present

## 2016-12-16 DIAGNOSIS — I25810 Atherosclerosis of coronary artery bypass graft(s) without angina pectoris: Secondary | ICD-10-CM | POA: Diagnosis not present

## 2016-12-16 DIAGNOSIS — I10 Essential (primary) hypertension: Secondary | ICD-10-CM | POA: Diagnosis not present

## 2016-12-16 DIAGNOSIS — E1129 Type 2 diabetes mellitus with other diabetic kidney complication: Secondary | ICD-10-CM | POA: Diagnosis not present

## 2016-12-16 DIAGNOSIS — Z0000 Encounter for general adult medical examination without abnormal findings: Secondary | ICD-10-CM | POA: Diagnosis not present

## 2016-12-16 DIAGNOSIS — M545 Low back pain: Secondary | ICD-10-CM | POA: Diagnosis not present

## 2016-12-16 DIAGNOSIS — Z6827 Body mass index (BMI) 27.0-27.9, adult: Secondary | ICD-10-CM | POA: Diagnosis not present

## 2016-12-21 DIAGNOSIS — L570 Actinic keratosis: Secondary | ICD-10-CM | POA: Diagnosis not present

## 2016-12-21 DIAGNOSIS — C4401 Basal cell carcinoma of skin of lip: Secondary | ICD-10-CM | POA: Diagnosis not present

## 2016-12-21 DIAGNOSIS — L738 Other specified follicular disorders: Secondary | ICD-10-CM | POA: Diagnosis not present

## 2017-01-19 ENCOUNTER — Encounter: Payer: Self-pay | Admitting: Podiatry

## 2017-01-19 ENCOUNTER — Ambulatory Visit: Payer: PPO | Admitting: Podiatry

## 2017-01-19 DIAGNOSIS — E1142 Type 2 diabetes mellitus with diabetic polyneuropathy: Secondary | ICD-10-CM

## 2017-01-19 DIAGNOSIS — B351 Tinea unguium: Secondary | ICD-10-CM | POA: Diagnosis not present

## 2017-01-19 DIAGNOSIS — M2042 Other hammer toe(s) (acquired), left foot: Secondary | ICD-10-CM

## 2017-01-19 DIAGNOSIS — M79676 Pain in unspecified toe(s): Secondary | ICD-10-CM | POA: Diagnosis not present

## 2017-01-19 DIAGNOSIS — M2041 Other hammer toe(s) (acquired), right foot: Secondary | ICD-10-CM

## 2017-01-19 NOTE — Progress Notes (Signed)
Patient ID: Nathan [name], male   DOB: [birth year], 83 y.o.   MRN: [id number] Complaint:  Visit Type: Patient returns to my office for continued preventative foot care services. Complaint: Patient states" my nails have grown long and thick and become painful to walk and wear shoes" Patient has been diagnosed with DM with no foot complications. The patient presents for preventative foot care services. No changes to ROS  Podiatric Exam: Vascular: dorsalis pedis and posterior tibial pulses are palpable bilateral. Capillary return is immediate. Temperature gradient is WNL. Skin turgor WNL  Sensorium: Diminished  Semmes Weinstein monofilament test. Normal tactile sensation bilaterally. Nail Exam: Pt has thick disfigured discolored nails with subungual debris noted bilateral entire nail hallux through fifth toenails Ulcer Exam: There is no evidence of ulcer or pre-ulcerative changes or infection. Orthopedic Exam: Muscle tone and strength are WNL. No limitations in general ROM. No crepitus or effusions noted. Foot type and digits show no abnormalities. Bony prominences are unremarkable.Hammer toes second B/l Skin: No Porokeratosis. No infection or ulcers. Porokeratosis sub tibial sesamoid both feet, presently asymptomatic.  Diagnosis:  Onychomycosis, , Pain in right toe, pain in left toes  Treatment & Plan Procedures and Treatment: Consent by patient was obtained for treatment procedures. The patient understood the discussion of treatment and procedures well. All questions were answered thoroughly reviewed. Debridement of mycotic and hypertrophic toenails, 1 through 5 bilateral and clearing of subungual debris. No ulceration, no infection noted.  Continue to check on his diabetic shoes. Return Visit-Office Procedure: Patient instructed to return to the office for a follow up visit 3 months for continued evaluation and treatment.   Gardiner Barefoot DPM

## 2017-02-02 ENCOUNTER — Ambulatory Visit (INDEPENDENT_AMBULATORY_CARE_PROVIDER_SITE_OTHER): Payer: PPO | Admitting: Orthotics

## 2017-02-02 DIAGNOSIS — E1142 Type 2 diabetes mellitus with diabetic polyneuropathy: Secondary | ICD-10-CM

## 2017-02-02 DIAGNOSIS — Z85828 Personal history of other malignant neoplasm of skin: Secondary | ICD-10-CM | POA: Diagnosis not present

## 2017-02-02 DIAGNOSIS — M2041 Other hammer toe(s) (acquired), right foot: Secondary | ICD-10-CM

## 2017-02-02 DIAGNOSIS — L84 Corns and callosities: Secondary | ICD-10-CM

## 2017-02-02 DIAGNOSIS — L570 Actinic keratosis: Secondary | ICD-10-CM | POA: Diagnosis not present

## 2017-02-02 DIAGNOSIS — L905 Scar conditions and fibrosis of skin: Secondary | ICD-10-CM | POA: Diagnosis not present

## 2017-02-02 DIAGNOSIS — M2042 Other hammer toe(s) (acquired), left foot: Secondary | ICD-10-CM

## 2017-02-03 NOTE — Progress Notes (Signed)

## 2017-02-17 ENCOUNTER — Other Ambulatory Visit: Payer: Self-pay | Admitting: Internal Medicine

## 2017-02-17 DIAGNOSIS — E1129 Type 2 diabetes mellitus with other diabetic kidney complication: Secondary | ICD-10-CM | POA: Diagnosis not present

## 2017-02-17 DIAGNOSIS — N183 Chronic kidney disease, stage 3 unspecified: Secondary | ICD-10-CM

## 2017-02-17 DIAGNOSIS — Z6827 Body mass index (BMI) 27.0-27.9, adult: Secondary | ICD-10-CM | POA: Diagnosis not present

## 2017-02-17 DIAGNOSIS — I10 Essential (primary) hypertension: Secondary | ICD-10-CM | POA: Diagnosis not present

## 2017-02-17 DIAGNOSIS — Z794 Long term (current) use of insulin: Secondary | ICD-10-CM | POA: Diagnosis not present

## 2017-02-21 ENCOUNTER — Ambulatory Visit
Admission: RE | Admit: 2017-02-21 | Discharge: 2017-02-21 | Disposition: A | Discharge status: Home / Self Care | Payer: PPO | Source: Ambulatory Visit | Attending: Internal Medicine | Admitting: Internal Medicine

## 2017-02-21 DIAGNOSIS — N183 Chronic kidney disease, stage 3 unspecified: Secondary | ICD-10-CM

## 2017-03-28 DIAGNOSIS — E1121 Type 2 diabetes mellitus with diabetic nephropathy: Secondary | ICD-10-CM | POA: Diagnosis not present

## 2017-03-28 DIAGNOSIS — I351 Nonrheumatic aortic (valve) insufficiency: Secondary | ICD-10-CM | POA: Diagnosis not present

## 2017-03-28 DIAGNOSIS — I25810 Atherosclerosis of coronary artery bypass graft(s) without angina pectoris: Secondary | ICD-10-CM | POA: Diagnosis not present

## 2017-03-28 DIAGNOSIS — E1129 Type 2 diabetes mellitus with other diabetic kidney complication: Secondary | ICD-10-CM | POA: Diagnosis not present

## 2017-03-28 DIAGNOSIS — Z794 Long term (current) use of insulin: Secondary | ICD-10-CM | POA: Diagnosis not present

## 2017-03-28 DIAGNOSIS — Z6827 Body mass index (BMI) 27.0-27.9, adult: Secondary | ICD-10-CM | POA: Diagnosis not present

## 2017-03-28 DIAGNOSIS — Z1389 Encounter for screening for other disorder: Secondary | ICD-10-CM | POA: Diagnosis not present

## 2017-03-28 DIAGNOSIS — N183 Chronic kidney disease, stage 3 (moderate): Secondary | ICD-10-CM | POA: Diagnosis not present

## 2017-04-27 ENCOUNTER — Ambulatory Visit: Payer: PPO | Admitting: Podiatry

## 2017-04-27 ENCOUNTER — Encounter: Payer: Self-pay | Admitting: Podiatry

## 2017-04-27 DIAGNOSIS — B351 Tinea unguium: Secondary | ICD-10-CM

## 2017-04-27 DIAGNOSIS — E1142 Type 2 diabetes mellitus with diabetic polyneuropathy: Secondary | ICD-10-CM | POA: Diagnosis not present

## 2017-04-27 DIAGNOSIS — M79676 Pain in unspecified toe(s): Secondary | ICD-10-CM

## 2017-04-27 DIAGNOSIS — M2041 Other hammer toe(s) (acquired), right foot: Secondary | ICD-10-CM

## 2017-04-27 DIAGNOSIS — M2042 Other hammer toe(s) (acquired), left foot: Secondary | ICD-10-CM

## 2017-04-27 NOTE — Progress Notes (Signed)
Patient ID: Nathan [name], male   DOB: 1933/11/23, 84 y.o.   MRN: [id number] Complaint:  Visit Type: Patient returns to my office for continued preventative foot care services. Complaint: Patient states" my nails have grown long and thick and become painful to walk and wear shoes" Patient has been diagnosed with DM with no foot complications. The patient presents for preventative foot care services. No changes to ROS  Podiatric Exam: Vascular: dorsalis pedis and posterior tibial pulses are palpable bilateral. Capillary return is immediate. Temperature gradient is WNL. Skin turgor WNL  Sensorium: Diminished  Semmes Weinstein monofilament test. Normal tactile sensation bilaterally. Nail Exam: Pt has thick disfigured discolored nails with subungual debris noted bilateral entire nail hallux through fifth toenails Ulcer Exam: There is no evidence of ulcer or pre-ulcerative changes or infection. Orthopedic Exam: Muscle tone and strength are WNL. No limitations in general ROM. No crepitus or effusions noted. Foot type and digits show no abnormalities. Bony prominences are unremarkable.Hammer toes second B/l Skin: No Porokeratosis. No infection or ulcers.   Diagnosis:  Onychomycosis, , Pain in right toe, pain in left toes  Treatment & Plan Procedures and Treatment: Consent by patient was obtained for treatment procedures. The patient understood the discussion of treatment and procedures well. All questions were answered thoroughly reviewed. Debridement of mycotic and hypertrophic toenails, 1 through 5 bilateral and clearing of subungual debris. No ulceration, no infection noted.   Return Visit-Office Procedure: Patient instructed to return to the office for a follow up visit 3 months for continued evaluation and treatment.   Gardiner Barefoot DPM

## 2017-05-25 DIAGNOSIS — E1129 Type 2 diabetes mellitus with other diabetic kidney complication: Secondary | ICD-10-CM | POA: Diagnosis not present

## 2017-05-25 DIAGNOSIS — Z6827 Body mass index (BMI) 27.0-27.9, adult: Secondary | ICD-10-CM | POA: Diagnosis not present

## 2017-05-25 DIAGNOSIS — N183 Chronic kidney disease, stage 3 (moderate): Secondary | ICD-10-CM | POA: Diagnosis not present

## 2017-05-25 DIAGNOSIS — Z794 Long term (current) use of insulin: Secondary | ICD-10-CM | POA: Diagnosis not present

## 2017-05-25 DIAGNOSIS — I10 Essential (primary) hypertension: Secondary | ICD-10-CM | POA: Diagnosis not present

## 2017-05-31 ENCOUNTER — Encounter: Payer: Self-pay | Admitting: Cardiovascular Disease

## 2017-05-31 ENCOUNTER — Ambulatory Visit: Payer: PPO | Admitting: Cardiovascular Disease

## 2017-05-31 VITALS — BP 138/64 | HR 71 | Ht 72.0 in | Wt 199.4 lb

## 2017-05-31 DIAGNOSIS — I2510 Atherosclerotic heart disease of native coronary artery without angina pectoris: Secondary | ICD-10-CM | POA: Diagnosis not present

## 2017-05-31 DIAGNOSIS — I351 Nonrheumatic aortic (valve) insufficiency: Secondary | ICD-10-CM

## 2017-05-31 NOTE — Patient Instructions (Signed)
Medication Instructions:  Your physician recommends that you continue on your current medications as directed. Please refer to the Current Medication list given to you today.   Labwork: None Ordered   Testing/Procedures: None Ordered   Follow-Up: Your physician wants you to follow-up in: 6 months with Dr. Nahser.  You will receive a reminder letter in the mail two months in advance. If you don't receive a letter, please call our office to schedule the follow-up appointment.   If you need a refill on your cardiac medications before your next appointment, please call your pharmacy.   Thank you for choosing [facility]! Kandiss Ihrig, RN [phone]    

## 2017-05-31 NOTE — Progress Notes (Signed)
Nathan [name] Date of Birth  1933-05-30       [facility]    [employer] [address], [place], [place]  [address]   [place], [place]  [address] [phone]     [phone]   Fax  [phone]    Fax [phone]  Problem List: 1. Coronary artery disease-status post CABG 2. Aortic insufficiency 3. Hypercholesterolemia 4. Diabetes mellitus 5. Mitral regurgitation  History of Present Illness:  Nathan [name] is doing very well. He's not had any episodes of chest pain or shortness of breath. 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  Dec. 2, 2014:  Nathan [name] is doing well.  Not as much energy.   No CP or dyspnea.    Dec. 3,2015:  Doing well from a cardiac standpoint.  started taking insulin recently.   Worked again for [name].   Dec. 2, 2016:  Doing well from a cardiac standpoint.   BP is high today , was normal yesterday   Feb. 14, 2017:   Nathan [name] is seen back for a visit BP is a bit elevated today .   Eats lots of salty foods ( soup for dinner) He had acute cholecystitis and had a perc drain placed.  The drain has been removed.  He never had his GB removed.  Was placed on home O2 during the hospitalization  And has been on home O2 since He does not think he needs it.   He occasionally will forget to turn it on and cannot tell the difference.  Will check his O2 sate on RA. ( time off oxygen  1440)   During his hospitalization, he had an echocardiogram that reveals normal left ventricle systolic function. He did have mild pulmonary hypertension. He had a stress Myoview study which revealed normal left ventricular systolic function and revealed no ischemia.  Aug. 17, 2017:  Doing well. Had the gall bladder percutaneous drain removed. Did not have GB removed.  Stands on his feet all day , has some leg edema   Feb. 13, 2018:  Doing  well .   No further gall bladder issues  Sept.  26, 2018:  Nathan [name] is seen today for follow-up of his coronary artery disease. Also  has a history of essential hypertension.  May 31, 2017 Doing well . Strawberries  Are ready - hes still working  No CP  No further gall bladder issues  Current Outpatient Medications on File Prior to Visit  Medication Sig Dispense Refill  . amLODipine (NORVASC) 5 MG tablet Take 5 mg by mouth daily.      [name] aspirin 81 MG tablet Take 81 mg by mouth daily. Reported on 03/11/2015    . atenolol (TENORMIN) 50 MG tablet Take 50 mg by mouth 2 (two) times daily.     [name] atorvastatin (LIPITOR) 40 MG tablet Take 40 mg by mouth daily.  3  . insulin NPH-regular Human (NOVOLIN 70/30) (70-30) 100 UNIT/ML injection Inject 48-54 Units into the skin See admin instructions. Take 36 units in the morning and 22 units at bedtime.    . multivitamin (THERAGRAN) per tablet Take 1 tablet by mouth daily.      . NON FORMULARY Place 2 L into the nose continuous.    . Omega-3 Fatty Acids (FISH OIL) 1200 MG CAPS Take 2 capsules by mouth daily.     [name]  omeprazole (PRILOSEC) 20 MG capsule Take 20 mg by mouth daily.     . hydrochlorothiazide (HYDRODIURIL) 25 MG tablet Take 1 tablet (25 mg total) by mouth daily. 90 tablet 3  . potassium chloride (K-DUR) 10 MEQ tablet Take 1 tablet (10 mEq total) by mouth daily. 90 tablet 3   No current facility-administered medications on file prior to visit.     No Known Allergies  Past Medical History:  Diagnosis Date  . Acid reflux   . Aortic insufficiency   . Cholecystitis   . Coronary artery disease 2002   CABG x5  . Diabetes mellitus   . DVT (deep venous thrombosis) (HCC)    previously on coumadin  . Hypercholesteremia   . Hyperlipidemia   . Mitral valve regurgitation   . Skin cancer    tumor removed off of his back    Past Surgical History:  Procedure Laterality Date  . BILIARY DRAINAGE CATHETER PLACEMENT W/ BILE DUCT TUBE CHANGE   01/2015  . CORONARY ARTERY BYPASS GRAFT  12/21/2000   x5  . SKIN CANCER EXCISION    . TUMOR REMOVAL     off of back, skin cancer    Social History   Tobacco Use  Smoking Status Never Smoker  Smokeless Tobacco Never Used    Social History   Substance and Sexual Activity  Alcohol Use No    Family History  Problem Relation Age of Onset  . Heart attack Father   . Hypertension Mother   . Diabetes Brother   . Cancer Sister        breast  . Cancer Brother        throat & mouth    Reviw of Systems:  Noted in current history, otherwise review of systems is negative.  Physical Exam: Blood pressure 138/64, pulse 71, height 6' (1.829 m), weight 199 lb 6.4 oz (90.4 kg), SpO2 94 %.  GEN:  Well nourished, well developed in no acute distress HEENT: Normal NECK: No JVD; No carotid bruits LYMPHATICS: No lymphadenopathy CARDIAC: RR,  Soft systolic murmur RESPIRATORY:  Clear to auscultation without rales, wheezing or rhonchi  ABDOMEN: Soft, non-tender, non-distended MUSCULOSKELETAL:  No edema; No deformity  SKIN: Warm and dry NEUROLOGIC:  Alert and oriented x 3   ECG:   May 31, 2017: Normal sinus rhythm at 71.  First-degree AV block.  Occasional premature ventricular contractions. History of inferior wall myocardial infarction.  No changes from previous EKG.  Assessment / Plan:   1. Coronary artery disease-   No angina   2. Aortic insufficiency -  Stable   3. Hypercholesterolemia-   Managed by Dr. Eliodoro Gullett Aspen   4. Diabetes mellitus-   Managed by Dr. Ibraheem Voris Aspen  5. Mitral regurgitation -  Stable  6. Leg edema -  Better    Return in 6 months   [name], MD  05/31/2017 8:42 AM    [facility] [address],  [place], [place]  [address] Pager [phone] Phone: [phone]; Fax: [phone]

## 2017-06-24 DIAGNOSIS — E119 Type 2 diabetes mellitus without complications: Secondary | ICD-10-CM | POA: Diagnosis not present

## 2017-07-26 DIAGNOSIS — I10 Essential (primary) hypertension: Secondary | ICD-10-CM | POA: Diagnosis not present

## 2017-07-26 DIAGNOSIS — R05 Cough: Secondary | ICD-10-CM | POA: Diagnosis not present

## 2017-07-26 DIAGNOSIS — Z794 Long term (current) use of insulin: Secondary | ICD-10-CM | POA: Diagnosis not present

## 2017-07-26 DIAGNOSIS — Z6827 Body mass index (BMI) 27.0-27.9, adult: Secondary | ICD-10-CM | POA: Diagnosis not present

## 2017-07-26 DIAGNOSIS — E1129 Type 2 diabetes mellitus with other diabetic kidney complication: Secondary | ICD-10-CM | POA: Diagnosis not present

## 2017-07-26 DIAGNOSIS — I351 Nonrheumatic aortic (valve) insufficiency: Secondary | ICD-10-CM | POA: Diagnosis not present

## 2017-07-26 DIAGNOSIS — I25810 Atherosclerosis of coronary artery bypass graft(s) without angina pectoris: Secondary | ICD-10-CM | POA: Diagnosis not present

## 2017-07-29 ENCOUNTER — Ambulatory Visit: Payer: PPO | Admitting: Podiatry

## 2017-07-29 ENCOUNTER — Encounter: Payer: Self-pay | Admitting: Podiatry

## 2017-07-29 DIAGNOSIS — M2041 Other hammer toe(s) (acquired), right foot: Secondary | ICD-10-CM

## 2017-07-29 DIAGNOSIS — B351 Tinea unguium: Secondary | ICD-10-CM

## 2017-07-29 DIAGNOSIS — M79676 Pain in unspecified toe(s): Secondary | ICD-10-CM | POA: Diagnosis not present

## 2017-07-29 DIAGNOSIS — M2042 Other hammer toe(s) (acquired), left foot: Secondary | ICD-10-CM

## 2017-07-29 DIAGNOSIS — E1142 Type 2 diabetes mellitus with diabetic polyneuropathy: Secondary | ICD-10-CM

## 2017-07-29 NOTE — Progress Notes (Signed)
Patient ID: Nathan [name], male   DOB: 1933/07/21, 84 y.o.   MRN: [id number] Complaint:  Visit Type: Patient returns to my office for continued preventative foot care services. Complaint: Patient states" my nails have grown long and thick and become painful to walk and wear shoes" Patient has been diagnosed with DM with no foot complications. The patient presents for preventative foot care services. No changes to ROS  Podiatric Exam: Vascular: dorsalis pedis and posterior tibial pulses are palpable bilateral. Capillary return is immediate. Temperature gradient is WNL. Skin turgor WNL  Sensorium: Diminished  Semmes Weinstein monofilament test. Normal tactile sensation bilaterally. Nail Exam: Pt has thick disfigured discolored nails with subungual debris noted bilateral entire nail hallux through fifth toenails Ulcer Exam: There is no evidence of ulcer or pre-ulcerative changes or infection. Orthopedic Exam: Muscle tone and strength are WNL. No limitations in general ROM. No crepitus or effusions noted. Foot type and digits show no abnormalities. Bony prominences are unremarkable.Hammer toes second B/l.  MCJ DJD  B/L. Skin: No Porokeratosis. No infection or ulcers.   Diagnosis:  Onychomycosis, , Pain in right toe, pain in left toes  Treatment & Plan Procedures and Treatment: Consent by patient was obtained for treatment procedures. The patient understood the discussion of treatment and procedures well. All questions were answered thoroughly reviewed. Debridement of mycotic and hypertrophic toenails, 1 through 5 bilateral and clearing of subungual debris. No ulceration, no infection noted.  Appt. With [facility] for shoes for DPN, hammer toes and arthritis. Return Visit-Office Procedure: Patient instructed to return to the office for a follow up visit 3 months for continued evaluation and treatment.   Gardiner Barefoot DPM

## 2017-08-04 ENCOUNTER — Ambulatory Visit: Payer: PPO | Admitting: Orthotics

## 2017-08-04 DIAGNOSIS — M2041 Other hammer toe(s) (acquired), right foot: Secondary | ICD-10-CM

## 2017-08-04 DIAGNOSIS — M2042 Other hammer toe(s) (acquired), left foot: Secondary | ICD-10-CM

## 2017-08-04 DIAGNOSIS — E1142 Type 2 diabetes mellitus with diabetic polyneuropathy: Secondary | ICD-10-CM

## 2017-08-04 NOTE — Progress Notes (Signed)
Patient came in today to pick out diabetic shoes; Prudence Davidson is DPM, Sharlett Iles PCP. Chose apex [id number] as shoe; Dm2, PN, HT Measured 115 m on brannock

## 2017-08-17 DIAGNOSIS — D485 Neoplasm of uncertain behavior of skin: Secondary | ICD-10-CM | POA: Diagnosis not present

## 2017-08-17 DIAGNOSIS — C4441 Basal cell carcinoma of skin of scalp and neck: Secondary | ICD-10-CM | POA: Diagnosis not present

## 2017-08-17 DIAGNOSIS — L820 Inflamed seborrheic keratosis: Secondary | ICD-10-CM | POA: Diagnosis not present

## 2017-08-17 DIAGNOSIS — L821 Other seborrheic keratosis: Secondary | ICD-10-CM | POA: Diagnosis not present

## 2017-08-17 DIAGNOSIS — Z85828 Personal history of other malignant neoplasm of skin: Secondary | ICD-10-CM | POA: Diagnosis not present

## 2017-08-23 DIAGNOSIS — I951 Orthostatic hypotension: Secondary | ICD-10-CM | POA: Diagnosis not present

## 2017-08-23 DIAGNOSIS — R42 Dizziness and giddiness: Secondary | ICD-10-CM | POA: Diagnosis not present

## 2017-08-23 DIAGNOSIS — E1129 Type 2 diabetes mellitus with other diabetic kidney complication: Secondary | ICD-10-CM | POA: Diagnosis not present

## 2017-08-23 DIAGNOSIS — R05 Cough: Secondary | ICD-10-CM | POA: Diagnosis not present

## 2017-08-23 DIAGNOSIS — I351 Nonrheumatic aortic (valve) insufficiency: Secondary | ICD-10-CM | POA: Diagnosis not present

## 2017-08-23 DIAGNOSIS — Z6826 Body mass index (BMI) 26.0-26.9, adult: Secondary | ICD-10-CM | POA: Diagnosis not present

## 2017-08-23 DIAGNOSIS — R0609 Other forms of dyspnea: Secondary | ICD-10-CM | POA: Diagnosis not present

## 2017-08-23 DIAGNOSIS — N183 Chronic kidney disease, stage 3 (moderate): Secondary | ICD-10-CM | POA: Diagnosis not present

## 2017-08-25 ENCOUNTER — Telehealth: Payer: Self-pay | Admitting: Cardiovascular Disease

## 2017-08-25 DIAGNOSIS — N183 Chronic kidney disease, stage 3 (moderate): Secondary | ICD-10-CM | POA: Diagnosis not present

## 2017-08-25 DIAGNOSIS — I25810 Atherosclerosis of coronary artery bypass graft(s) without angina pectoris: Secondary | ICD-10-CM | POA: Diagnosis not present

## 2017-08-25 DIAGNOSIS — I351 Nonrheumatic aortic (valve) insufficiency: Secondary | ICD-10-CM | POA: Diagnosis not present

## 2017-08-25 DIAGNOSIS — R0609 Other forms of dyspnea: Secondary | ICD-10-CM | POA: Diagnosis not present

## 2017-08-25 DIAGNOSIS — I951 Orthostatic hypotension: Secondary | ICD-10-CM | POA: Diagnosis not present

## 2017-08-25 DIAGNOSIS — R42 Dizziness and giddiness: Secondary | ICD-10-CM | POA: Diagnosis not present

## 2017-08-25 DIAGNOSIS — Z6826 Body mass index (BMI) 26.0-26.9, adult: Secondary | ICD-10-CM | POA: Diagnosis not present

## 2017-08-25 DIAGNOSIS — E1129 Type 2 diabetes mellitus with other diabetic kidney complication: Secondary | ICD-10-CM | POA: Diagnosis not present

## 2017-08-25 DIAGNOSIS — I10 Essential (primary) hypertension: Secondary | ICD-10-CM | POA: Diagnosis not present

## 2017-08-25 NOTE — Telephone Encounter (Signed)
New Message:       Dr. 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 When pt stands his BP drops and pt is currently in the office.

## 2017-08-25 NOTE — Telephone Encounter (Signed)
Received call directly from operator from Dr. Shon Baton nurse. She states patient was seen in the office on Tuesday with c/o orthostatic hypotension. Today systolic BP while sitting is 146 mmHg and when standing 92 mmHg with very similar readings taken on Tuesday. Patient has not had any BP meds today. Patient was advised to increase hydration and to hold HCTZ and Atenolol. Nurse states patient's symptoms have not improved since holding medications. She reports patient's serum Cr was 2.3 on Tuesday, increased from previous level of 1.9. She states they have drawn a repeat bmet today but do not have the results yet. Dr. Philip Aspen requests Dr. Elmarie Shiley input regarding further medication changes due to hx of AI and CABG. She states patient has not had any EKG changes. I advised nurse that Dr. Acie Fredrickson is not in the office today but that I will forward message to him for review. I asked if Dr. Philip Aspen is comfortable with sending the patient home for a follow-up call from me later with Dr. Elmarie Shiley advice. She verbalized understanding and agreement with plan and thanked me for my help.

## 2017-08-25 NOTE — Telephone Encounter (Signed)
Called patient to review Dr. Elmarie Shiley advice with him. He states he has not felt well all week and has not worked outside all week. I asked him to get his pill bottles and I advised him which ones to stop. He verbalized understanding. I advised him to drink at least 64 oz of water daily and to increase that if he is outside. He admits to not eating well recently. I advised him to consume lean protein, fruits and vegetables throughout the day to prevent fluctuations in BP and blood sugar. He is scheduled to see Dr. Acie Fredrickson on Monday morning at 8:00 am. I advised him to call our office before Monday with worsening symptoms. I advised him of how to reach the on-call provider this weekend. He verbalized understanding and agreement with plan and thanked me for the call.

## 2017-08-25 NOTE — Telephone Encounter (Signed)
Sounds like he is still volume depleted.  He may not a had enough time to rehydrate with the 2 days of holding HCTZ.  Let's hold HCTZ, hold potassium chloride, hold amlodipine.  Continue atenolol.  I will see him in the office on Monday morning at 8:00. Have him call the office and have the on-call doctor paged over the weekend if he has any problems.  I am on call and  can help with  that.

## 2017-08-29 ENCOUNTER — Encounter: Payer: Self-pay | Admitting: Cardiovascular Disease

## 2017-08-29 ENCOUNTER — Ambulatory Visit: Payer: PPO | Admitting: Cardiovascular Disease

## 2017-08-29 VITALS — BP 131/86 | HR 84 | Ht 72.0 in | Wt 189.4 lb

## 2017-08-29 DIAGNOSIS — I10 Essential (primary) hypertension: Secondary | ICD-10-CM | POA: Diagnosis not present

## 2017-08-29 DIAGNOSIS — I2510 Atherosclerotic heart disease of native coronary artery without angina pectoris: Secondary | ICD-10-CM | POA: Diagnosis not present

## 2017-08-29 LAB — BASIC METABOLIC PANEL
BUN/Creatinine Ratio: 18 (ref 10–24)
BUN: 36 mg/dL — ABNORMAL HIGH (ref 8–27)
CALCIUM: 10.8 mg/dL — AB (ref 8.6–10.2)
CO2: 16 mmol/L — ABNORMAL LOW (ref 20–29)
CREATININE: 1.97 mg/dL — AB (ref 0.76–1.27)
Chloride: 102 mmol/L (ref 96–106)
GFR calc Af Amer: 36 mL/min/{1.73_m2} — ABNORMAL LOW (ref >59)
GFR, EST NON AFRICAN AMERICAN: 31 mL/min/{1.73_m2} — AB (ref >59)
Glucose: 100 mg/dL — ABNORMAL HIGH (ref 65–99)
Potassium: 4.1 mmol/L (ref 3.5–5.2)
Sodium: 137 mmol/L (ref 134–144)

## 2017-08-29 MED ORDER — ATENOLOL 25 MG PO TABS: 25.0000 mg | ORAL_TABLET | Freq: Two times a day (BID) | ORAL | 11 refills | Status: DC

## 2017-08-29 NOTE — Patient Instructions (Addendum)
Medication Instructions:  Your physician has recommended you make the following change in your medication:   START Atenolol (Tenormin) 25 mg twice daily - one in the AM and one in the PM STOP Amlodipine (Norvasc)   Labwork: TODAY - basic metabolic panel   Testing/Procedures: None Ordered   Follow-Up: Your physician recommends that you return for a follow-up appointment on Tuesday July 30 at 11:00 am for Nurse Visit/BP check   Your physician recommends that you return for a follow-up appointment with Dr. Acie Fredrickson on Monday Oct. 21 at 8:00 am   If you need a refill on your cardiac medications before your next appointment, please call your pharmacy.   Thank you for choosing [facility]! Christen Bame, RN [phone]

## 2017-08-29 NOTE — Progress Notes (Signed)
Nathan [name] Date of Birth  1933-03-31       [facility]    [employer] [address], [place], [place]  [address]   [place], [place]  [address] [phone]     [phone]   Fax  [phone]    Fax [phone]  Problem List: 1. Coronary artery disease-status post CABG 2. Aortic insufficiency 3. Hypercholesterolemia 4. Diabetes mellitus 5. Mitral regurgitation     Nathan [name] is doing very well. He's not had any episodes of chest pain or shortness of breath. 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  Dec. 2, 2014:  Nathan [name] is doing well.  Not as much energy.   No CP or dyspnea.    Dec. 3,2015:  Doing well from a cardiac standpoint.  started taking insulin recently.   Worked again for [name] over the summer and fall.   Dec. 2, 2016:  Doing well from a cardiac standpoint.   BP is high today , was normal yesterday   Feb. 14, 2017:   Nathan [name] is seen back for a visit BP is a bit elevated today .   Eats lots of salty foods ( soup for dinner) He had acute cholecystitis and had a perc drain placed.  The drain has been removed.  He never had his GB removed.  Was placed on home O2 during the hospitalization  And has been on home O2 since He does not think he needs it.   He occasionally will forget to turn it on and cannot tell the difference.  Will check his O2 sate on RA. ( time off oxygen  1440)   During his hospitalization, he had an echocardiogram that reveals normal left ventricle systolic function. He did have mild pulmonary hypertension. He had a stress Myoview study which revealed normal left ventricular systolic function and revealed no ischemia.  Aug. 17, 2017:  Doing well. Had the gall bladder percutaneous drain removed. Did not have GB removed.  Stands on his feet all day , has some leg edema   Feb. 13, 2018:  Doing well .   No further gall  bladder issues  Sept.  26, 2018:  Nathan [name] is seen today for follow-up of his coronary artery disease. Also  has a history of essential hypertension.  May 31, 2017 Doing well . Strawberries  Are ready - hes still working  No CP  No further gall bladder issues  August 29, 2017:  Nathan [name] is seen today  Has been having lots of orthostasis  Lightheadness. Has not been working - works at [name] - in [employer] building  Has been having lots of GI issues ( belching and GERD after eating ) , thinks he is having gall bladder issues.  Appetite has not been good  Labs from Dr. Tomie [name] office show a creatinine of 2.3  Had 4 of his pills stopped last week  Taking fish oil, amlodipine, ASA and potassium ( by mistake, was supposed to stop)  , MVI  Still eating salty meats    Current Outpatient Medications on File Prior to Visit  Medication Sig Dispense Refill  . aspirin 81 MG tablet Take 81 mg by mouth daily. Reported on 03/11/2015    . atorvastatin (LIPITOR) 40 MG tablet Take 40 mg by mouth daily.  3  . insulin NPH-regular Human (NOVOLIN 70/30) (70-30) 100 UNIT/ML injection  Inject 48-54 Units into the skin See admin instructions. Take 36 units in the morning and 22 units at bedtime.    . multivitamin (THERAGRAN) per tablet Take 1 tablet by mouth daily.      . NON FORMULARY Place 2 L into the nose continuous.    . Omega-3 Fatty Acids (FISH OIL) 1200 MG CAPS Take 2 capsules by mouth daily.     [name] omeprazole (PRILOSEC) 20 MG capsule Take 20 mg by mouth daily.      No current facility-administered medications on file prior to visit.     No Known Allergies  Past Medical History:  Diagnosis Date  . Acid reflux   . Aortic insufficiency   . Cholecystitis   . Coronary artery disease 2002   CABG x5  . Diabetes mellitus   . DVT (deep venous thrombosis) (HCC)    previously on coumadin  . Hypercholesteremia   . Hyperlipidemia   . Mitral valve regurgitation   . Skin cancer    tumor  removed off of his back    Past Surgical History:  Procedure Laterality Date  . BILIARY DRAINAGE CATHETER PLACEMENT W/ BILE DUCT TUBE CHANGE  01/2015  . CORONARY ARTERY BYPASS GRAFT  12/21/2000   x5  . SKIN CANCER EXCISION    . TUMOR REMOVAL     off of back, skin cancer    Social History   Tobacco Use  Smoking Status Never Smoker  Smokeless Tobacco Never Used    Social History   Substance and Sexual Activity  Alcohol Use No    Family History  Problem Relation Age of Onset  . Heart attack Father   . Hypertension Mother   . Diabetes Brother   . Cancer Sister        breast  . Cancer Brother        throat & mouth    Reviw of Systems:  Noted in current history, otherwise review of systems is negative.   Physical Exam: Blood pressure 131/86, pulse 84, height 6' (1.829 m), weight 189 lb 6.4 oz (85.9 kg), SpO2 98 %.   GEN:  Well nourished, well developed in no acute distress HEENT: Normal NECK: No JVD; No carotid bruits LYMPHATICS: No lymphadenopathy CARDIAC: RR, no murmurs, rubs, gallops RESPIRATORY:  Clear to auscultation without rales, wheezing or rhonchi  ABDOMEN: Soft, non-tender, non-distended MUSCULOSKELETAL:  No edema; No deformity  SKIN: Warm and dry NEUROLOGIC:  Alert and oriented x 3   ECG:      Assessment / Plan:   1. Coronary artery disease-    denies any episodes of angina.  2.  Orthostatic hypotension: His blood pressure is quite variable.  He is having symptoms of orthostatic hypotension.  We will discontinue the amlodipine.  Will restart atenolol 25 mg twice a day.  He accidentally stayed on potassium despite the fact that he stopped hydrochlorothiazide.  We will discontinue the potassium.  Check a basic metabolic profile today.  We will have him come for a nurse visit in 2 weeks.  Advised him to stay away from hot dogs and other processed meats.  He has trouble following this low-salt diet because he eats most of his meals in restaurants or  skips a meal.  2. Aortic insufficiency -  Stable   3. Hypercholesterolemia-   Managed by Dr. Andrienne Havener Aspen   4. Diabetes mellitus-   Managed by Dr. Kambryn Dapolito Aspen  5. Mitral regurgitation -  Stable  6. Leg edema -  Better  Return in 3 months   [name], MD  08/29/2017 11:23 AM    [facility],  [place], [place]  [address] Pager [phone] Phone: [phone]; Fax: [phone]

## 2017-09-05 ENCOUNTER — Ambulatory Visit: Payer: PPO | Admitting: Orthotics

## 2017-09-05 ENCOUNTER — Ambulatory Visit (INDEPENDENT_AMBULATORY_CARE_PROVIDER_SITE_OTHER): Payer: PPO | Admitting: Orthotics

## 2017-09-05 DIAGNOSIS — M2041 Other hammer toe(s) (acquired), right foot: Secondary | ICD-10-CM | POA: Diagnosis not present

## 2017-09-05 DIAGNOSIS — M2042 Other hammer toe(s) (acquired), left foot: Secondary | ICD-10-CM

## 2017-09-05 DIAGNOSIS — E1142 Type 2 diabetes mellitus with diabetic polyneuropathy: Secondary | ICD-10-CM

## 2017-09-05 NOTE — Progress Notes (Signed)

## 2017-09-06 DIAGNOSIS — E1129 Type 2 diabetes mellitus with other diabetic kidney complication: Secondary | ICD-10-CM | POA: Diagnosis not present

## 2017-09-06 DIAGNOSIS — Z794 Long term (current) use of insulin: Secondary | ICD-10-CM | POA: Diagnosis not present

## 2017-09-06 DIAGNOSIS — I951 Orthostatic hypotension: Secondary | ICD-10-CM | POA: Diagnosis not present

## 2017-09-06 DIAGNOSIS — I10 Essential (primary) hypertension: Secondary | ICD-10-CM | POA: Diagnosis not present

## 2017-09-06 DIAGNOSIS — N183 Chronic kidney disease, stage 3 (moderate): Secondary | ICD-10-CM | POA: Diagnosis not present

## 2017-09-13 ENCOUNTER — Ambulatory Visit (INDEPENDENT_AMBULATORY_CARE_PROVIDER_SITE_OTHER): Payer: PPO | Admitting: Nurse Practitioner

## 2017-09-13 ENCOUNTER — Ambulatory Visit: Payer: PPO

## 2017-09-13 VITALS — BP 128/64 | HR 78 | Ht 72.0 in | Wt 199.5 lb

## 2017-09-13 DIAGNOSIS — I951 Orthostatic hypotension: Secondary | ICD-10-CM | POA: Diagnosis not present

## 2017-09-13 DIAGNOSIS — I10 Essential (primary) hypertension: Secondary | ICD-10-CM

## 2017-09-13 NOTE — Patient Instructions (Signed)
Medication Instructions:  Your physician recommends that you continue on your current medications as directed. Please refer to the Current Medication list given to you today.  **Call our office if dizziness returns  Labwork: None Ordered   Testing/Procedures: None Ordered   Follow-Up: Your physician recommends that you keep your follow-up appointment on October 21 with Dr. Acie Fredrickson  If you need a refill on your cardiac medications before your next appointment, please call your pharmacy.   Thank you for choosing [facility]! Christen Bame, RN [phone]

## 2017-09-13 NOTE — Progress Notes (Signed)
1.) Reason for visit: BP check for orthostatic hypotension  2.) Name of MD requesting visit: Dr. Acie Fredrickson   3.) H&P: Patient returns for BP check after seeing Dr. Acie Fredrickson on 7/15 with complaints of orthostatic hypotension. Patient's BP meds were adjusted and patient returns today for recheck  4.) ROS related to problem: patient denies complaints of dizziness or light headedness with position change. He states he feels better except he thinks his gallbladder is interfering with his appetite. I encouraged him to call his PCP about this issue  5.) Assessment and plan per MD: Per Dr. Acie Fredrickson, BP is improved. Continue current medications and f/u as directed in October

## 2017-09-15 DIAGNOSIS — I10 Essential (primary) hypertension: Secondary | ICD-10-CM | POA: Diagnosis not present

## 2017-09-15 DIAGNOSIS — E1129 Type 2 diabetes mellitus with other diabetic kidney complication: Secondary | ICD-10-CM | POA: Diagnosis not present

## 2017-09-15 DIAGNOSIS — R1013 Epigastric pain: Secondary | ICD-10-CM | POA: Diagnosis not present

## 2017-09-15 DIAGNOSIS — Z6826 Body mass index (BMI) 26.0-26.9, adult: Secondary | ICD-10-CM | POA: Diagnosis not present

## 2017-09-15 DIAGNOSIS — Z794 Long term (current) use of insulin: Secondary | ICD-10-CM | POA: Diagnosis not present

## 2017-09-15 DIAGNOSIS — I25810 Atherosclerosis of coronary artery bypass graft(s) without angina pectoris: Secondary | ICD-10-CM | POA: Diagnosis not present

## 2017-09-15 DIAGNOSIS — I351 Nonrheumatic aortic (valve) insufficiency: Secondary | ICD-10-CM | POA: Diagnosis not present

## 2017-09-19 ENCOUNTER — Ambulatory Visit (HOSPITAL_COMMUNITY)
Admission: RE | Admit: 2017-09-19 | Discharge: 2017-09-19 | Disposition: A | Discharge status: Home / Self Care | Payer: PPO | Source: Ambulatory Visit | Attending: Surgery | Admitting: Surgery

## 2017-09-19 ENCOUNTER — Other Ambulatory Visit (HOSPITAL_COMMUNITY): Payer: Self-pay | Admitting: Registered Nurse

## 2017-09-19 ENCOUNTER — Other Ambulatory Visit: Payer: Self-pay | Admitting: Internal Medicine

## 2017-09-19 DIAGNOSIS — N183 Chronic kidney disease, stage 3 (moderate): Secondary | ICD-10-CM | POA: Diagnosis not present

## 2017-09-19 DIAGNOSIS — I82402 Acute embolism and thrombosis of unspecified deep veins of left lower extremity: Secondary | ICD-10-CM | POA: Diagnosis not present

## 2017-09-19 DIAGNOSIS — M7989 Other specified soft tissue disorders: Secondary | ICD-10-CM | POA: Insufficient documentation

## 2017-09-19 DIAGNOSIS — I951 Orthostatic hypotension: Secondary | ICD-10-CM | POA: Diagnosis not present

## 2017-09-19 DIAGNOSIS — I82412 Acute embolism and thrombosis of left femoral vein: Secondary | ICD-10-CM | POA: Diagnosis not present

## 2017-09-19 DIAGNOSIS — Z6827 Body mass index (BMI) 27.0-27.9, adult: Secondary | ICD-10-CM | POA: Diagnosis not present

## 2017-09-19 DIAGNOSIS — R1013 Epigastric pain: Secondary | ICD-10-CM

## 2017-09-19 DIAGNOSIS — R0609 Other forms of dyspnea: Secondary | ICD-10-CM | POA: Diagnosis not present

## 2017-09-21 ENCOUNTER — Other Ambulatory Visit (HOSPITAL_COMMUNITY): Payer: Self-pay | Admitting: Registered Nurse

## 2017-09-21 DIAGNOSIS — R0600 Dyspnea, unspecified: Secondary | ICD-10-CM

## 2017-09-21 DIAGNOSIS — R600 Localized edema: Secondary | ICD-10-CM

## 2017-09-22 DIAGNOSIS — I82402 Acute embolism and thrombosis of unspecified deep veins of left lower extremity: Secondary | ICD-10-CM | POA: Diagnosis not present

## 2017-09-22 DIAGNOSIS — I872 Venous insufficiency (chronic) (peripheral): Secondary | ICD-10-CM | POA: Diagnosis not present

## 2017-09-22 DIAGNOSIS — Z6826 Body mass index (BMI) 26.0-26.9, adult: Secondary | ICD-10-CM | POA: Diagnosis not present

## 2017-09-22 DIAGNOSIS — R600 Localized edema: Secondary | ICD-10-CM | POA: Diagnosis not present

## 2017-09-28 ENCOUNTER — Ambulatory Visit
Admission: RE | Admit: 2017-09-28 | Discharge: 2017-09-28 | Disposition: A | Discharge status: Home / Self Care | Payer: PPO | Source: Ambulatory Visit | Attending: Internal Medicine | Admitting: Internal Medicine

## 2017-09-28 DIAGNOSIS — K838 Other specified diseases of biliary tract: Secondary | ICD-10-CM | POA: Diagnosis not present

## 2017-09-28 DIAGNOSIS — R1013 Epigastric pain: Secondary | ICD-10-CM

## 2017-09-29 ENCOUNTER — Other Ambulatory Visit: Payer: Self-pay

## 2017-09-29 ENCOUNTER — Ambulatory Visit (HOSPITAL_COMMUNITY): Payer: PPO | Attending: Cardiology

## 2017-09-29 DIAGNOSIS — E785 Hyperlipidemia, unspecified: Secondary | ICD-10-CM | POA: Insufficient documentation

## 2017-09-29 DIAGNOSIS — R0600 Dyspnea, unspecified: Secondary | ICD-10-CM

## 2017-09-29 DIAGNOSIS — I083 Combined rheumatic disorders of mitral, aortic and tricuspid valves: Secondary | ICD-10-CM | POA: Insufficient documentation

## 2017-09-29 DIAGNOSIS — R600 Localized edema: Secondary | ICD-10-CM

## 2017-09-29 DIAGNOSIS — I2510 Atherosclerotic heart disease of native coronary artery without angina pectoris: Secondary | ICD-10-CM | POA: Diagnosis not present

## 2017-09-29 DIAGNOSIS — I10 Essential (primary) hypertension: Secondary | ICD-10-CM | POA: Insufficient documentation

## 2017-10-03 ENCOUNTER — Other Ambulatory Visit (HOSPITAL_COMMUNITY): Payer: Self-pay | Admitting: Internal Medicine

## 2017-10-03 DIAGNOSIS — R1013 Epigastric pain: Secondary | ICD-10-CM

## 2017-10-05 ENCOUNTER — Other Ambulatory Visit: Payer: Self-pay | Admitting: Cardiovascular Disease

## 2017-10-05 DIAGNOSIS — R1013 Epigastric pain: Secondary | ICD-10-CM | POA: Diagnosis not present

## 2017-10-05 DIAGNOSIS — Z6826 Body mass index (BMI) 26.0-26.9, adult: Secondary | ICD-10-CM | POA: Diagnosis not present

## 2017-10-05 DIAGNOSIS — R0609 Other forms of dyspnea: Secondary | ICD-10-CM | POA: Diagnosis not present

## 2017-10-05 DIAGNOSIS — I82402 Acute embolism and thrombosis of unspecified deep veins of left lower extremity: Secondary | ICD-10-CM | POA: Diagnosis not present

## 2017-10-05 NOTE — Telephone Encounter (Signed)
2.  Orthostatic hypotension: His blood pressure is quite variable.  He is having symptoms of orthostatic hypotension.  We will discontinue the amlodipine.  Will restart atenolol 25 mg twice a day.  He accidentally stayed on potassium despite the fact that he stopped hydrochlorothiazide.  We will discontinue the potassium.  Check a basic metabolic profile today.

## 2017-10-18 ENCOUNTER — Encounter (HOSPITAL_COMMUNITY)
Admission: RE | Admit: 2017-10-18 | Discharge: 2017-10-18 | Disposition: A | Discharge status: Home / Self Care | Payer: PPO | Source: Ambulatory Visit | Attending: Internal Medicine | Admitting: Internal Medicine

## 2017-10-18 DIAGNOSIS — R112 Nausea with vomiting, unspecified: Secondary | ICD-10-CM | POA: Diagnosis not present

## 2017-10-18 DIAGNOSIS — R1084 Generalized abdominal pain: Secondary | ICD-10-CM | POA: Diagnosis not present

## 2017-10-18 DIAGNOSIS — R1013 Epigastric pain: Secondary | ICD-10-CM | POA: Insufficient documentation

## 2017-10-18 MED ORDER — TECHNETIUM TC 99M MEBROFENIN IV KIT
5.1000 | PACK | Freq: Once | INTRAVENOUS | Status: AC | PRN
  Administered 2017-10-18: 5.1 via INTRAVENOUS

## 2017-10-21 DIAGNOSIS — I82402 Acute embolism and thrombosis of unspecified deep veins of left lower extremity: Secondary | ICD-10-CM | POA: Diagnosis not present

## 2017-10-21 DIAGNOSIS — Z6825 Body mass index (BMI) 25.0-25.9, adult: Secondary | ICD-10-CM | POA: Diagnosis not present

## 2017-10-21 DIAGNOSIS — R634 Abnormal weight loss: Secondary | ICD-10-CM | POA: Diagnosis not present

## 2017-10-21 DIAGNOSIS — R0609 Other forms of dyspnea: Secondary | ICD-10-CM | POA: Diagnosis not present

## 2017-10-21 DIAGNOSIS — R1013 Epigastric pain: Secondary | ICD-10-CM | POA: Diagnosis not present

## 2017-10-24 ENCOUNTER — Other Ambulatory Visit: Payer: Self-pay | Admitting: Registered Nurse

## 2017-10-24 DIAGNOSIS — I80202 Phlebitis and thrombophlebitis of unspecified deep vessels of left lower extremity: Secondary | ICD-10-CM

## 2017-10-24 DIAGNOSIS — R0600 Dyspnea, unspecified: Secondary | ICD-10-CM

## 2017-10-25 ENCOUNTER — Other Ambulatory Visit: Payer: Self-pay | Admitting: Registered Nurse

## 2017-10-25 DIAGNOSIS — R109 Unspecified abdominal pain: Secondary | ICD-10-CM

## 2017-10-25 DIAGNOSIS — R0600 Dyspnea, unspecified: Secondary | ICD-10-CM

## 2017-10-26 ENCOUNTER — Ambulatory Visit
Admission: RE | Admit: 2017-10-26 | Discharge: 2017-10-26 | Disposition: A | Discharge status: Home / Self Care | Payer: PPO | Source: Ambulatory Visit | Attending: Registered Nurse | Admitting: Registered Nurse

## 2017-10-26 ENCOUNTER — Other Ambulatory Visit: Payer: Self-pay | Admitting: Registered Nurse

## 2017-10-26 DIAGNOSIS — K760 Fatty (change of) liver, not elsewhere classified: Secondary | ICD-10-CM | POA: Diagnosis not present

## 2017-10-26 DIAGNOSIS — R0600 Dyspnea, unspecified: Secondary | ICD-10-CM

## 2017-10-26 DIAGNOSIS — R109 Unspecified abdominal pain: Secondary | ICD-10-CM

## 2017-10-28 ENCOUNTER — Encounter: Payer: Self-pay | Admitting: Podiatry

## 2017-10-28 ENCOUNTER — Ambulatory Visit: Payer: PPO | Admitting: Podiatry

## 2017-10-28 DIAGNOSIS — M79676 Pain in unspecified toe(s): Secondary | ICD-10-CM | POA: Diagnosis not present

## 2017-10-28 DIAGNOSIS — B351 Tinea unguium: Secondary | ICD-10-CM | POA: Diagnosis not present

## 2017-10-28 DIAGNOSIS — E1142 Type 2 diabetes mellitus with diabetic polyneuropathy: Secondary | ICD-10-CM | POA: Diagnosis not present

## 2017-10-28 DIAGNOSIS — D689 Coagulation defect, unspecified: Secondary | ICD-10-CM

## 2017-10-28 NOTE — Progress Notes (Addendum)
Patient ID: Nathan [name], male   DOB: [birth year], 84 y.o.   MRN: [id number] Complaint:  Visit Type: Patient returns to my office for continued preventative foot care services. Complaint: Patient states" my nails have grown long and thick and become painful to walk and wear shoes" Patient has been diagnosed with DM with no foot complications. The patient presents for preventative foot care services. No changes to ROS.  Patient is on eliquiss.  Podiatric Exam: Vascular: dorsalis pedis and posterior tibial pulses are palpable bilateral. Capillary return is immediate. Temperature gradient is WNL. Skin turgor WNL  Sensorium: Diminished  Semmes Weinstein monofilament test. Normal tactile sensation bilaterally. Nail Exam: Pt has thick disfigured discolored nails with subungual debris noted bilateral entire nail hallux through fifth toenails Ulcer Exam: There is no evidence of ulcer or pre-ulcerative changes or infection. Orthopedic Exam: Muscle tone and strength are WNL. No limitations in general ROM. No crepitus or effusions noted. Foot type and digits show no abnormalities. Bony prominences are unremarkable.Hammer toes second B/l.  MCJ DJD  B/L. Skin: No Porokeratosis. No infection or ulcers.   Diagnosis:  Onychomycosis, , Pain in right toe, pain in left toes  Treatment & Plan Procedures and Treatment: Consent by patient was obtained for treatment procedures. The patient understood the discussion of treatment and procedures well. All questions were answered thoroughly reviewed. Debridement of mycotic and hypertrophic toenails, 1 through 5 bilateral and clearing of subungual debris. No ulceration, no infection noted.  Discussed elastic therapy to acquire compression for left leg/foot.. Return Visit-Office Procedure: Patient instructed to return to the office for a follow up visit 3 months for continued evaluation and treatment.   Gardiner Barefoot DPM

## 2017-11-01 ENCOUNTER — Other Ambulatory Visit: Payer: Self-pay | Admitting: Internal Medicine

## 2017-11-01 DIAGNOSIS — R0600 Dyspnea, unspecified: Secondary | ICD-10-CM

## 2017-11-01 DIAGNOSIS — R911 Solitary pulmonary nodule: Secondary | ICD-10-CM

## 2017-11-02 DIAGNOSIS — C44219 Basal cell carcinoma of skin of left ear and external auricular canal: Secondary | ICD-10-CM | POA: Diagnosis not present

## 2017-11-04 ENCOUNTER — Ambulatory Visit
Admission: RE | Admit: 2017-11-04 | Discharge: 2017-11-04 | Disposition: A | Discharge status: Home / Self Care | Payer: PPO | Source: Ambulatory Visit | Attending: Internal Medicine | Admitting: Internal Medicine

## 2017-11-04 DIAGNOSIS — R0600 Dyspnea, unspecified: Secondary | ICD-10-CM

## 2017-11-04 DIAGNOSIS — R911 Solitary pulmonary nodule: Secondary | ICD-10-CM

## 2017-11-04 DIAGNOSIS — R918 Other nonspecific abnormal finding of lung field: Secondary | ICD-10-CM | POA: Diagnosis not present

## 2017-11-17 DIAGNOSIS — N323 Diverticulum of bladder: Secondary | ICD-10-CM | POA: Diagnosis not present

## 2017-11-17 DIAGNOSIS — R3914 Feeling of incomplete bladder emptying: Secondary | ICD-10-CM | POA: Diagnosis not present

## 2017-11-17 DIAGNOSIS — N401 Enlarged prostate with lower urinary tract symptoms: Secondary | ICD-10-CM | POA: Diagnosis not present

## 2017-11-17 DIAGNOSIS — R3912 Poor urinary stream: Secondary | ICD-10-CM | POA: Diagnosis not present

## 2017-12-05 ENCOUNTER — Encounter (INDEPENDENT_AMBULATORY_CARE_PROVIDER_SITE_OTHER): Payer: Self-pay

## 2017-12-05 ENCOUNTER — Encounter: Payer: Self-pay | Admitting: Cardiovascular Disease

## 2017-12-05 ENCOUNTER — Ambulatory Visit: Payer: PPO | Admitting: Cardiovascular Disease

## 2017-12-05 VITALS — BP 134/62 | HR 60 | Ht 72.0 in | Wt 198.0 lb

## 2017-12-05 DIAGNOSIS — I10 Essential (primary) hypertension: Secondary | ICD-10-CM | POA: Diagnosis not present

## 2017-12-05 DIAGNOSIS — I2583 Coronary atherosclerosis due to lipid rich plaque: Secondary | ICD-10-CM | POA: Diagnosis not present

## 2017-12-05 DIAGNOSIS — I2510 Atherosclerotic heart disease of native coronary artery without angina pectoris: Secondary | ICD-10-CM

## 2017-12-05 NOTE — Patient Instructions (Signed)
Medication Instructions:  Your physician recommends that you continue on your current medications as directed. Please refer to the Current Medication list given to you today.   Labwork: None Ordered   Testing/Procedures: None Ordered   Follow-Up: Your physician wants you to follow-up in: 6 months with Dr. Nahser.  You will receive a reminder letter in the mail two months in advance. If you don't receive a letter, please call our office to schedule the follow-up appointment.   If you need a refill on your cardiac medications before your next appointment, please call your pharmacy.   Thank you for choosing [facility]! Savalas Monje, RN [phone]    

## 2017-12-05 NOTE — Progress Notes (Signed)
Nathan [name] Date of Birth  Jul 31, 1933       [facility]    [employer] [address], [place], [place]  [address]   [place],   [id number] [phone]     [phone]   Fax  [phone]    Fax [phone]  Problem List: 1. Coronary artery disease-status post CABG 2. Aortic insufficiency 3. Hypercholesterolemia 4. Diabetes mellitus 5. Mitral regurgitation     Mr. Afonso is doing very well. He's not had any episodes of chest pain or shortness of breath. 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  Dec. 2, 2014:  Mr. Cerullo is doing well.  Not as much energy.   No CP or dyspnea.    Dec. 3,2015:  Doing well from a cardiac standpoint.  started taking insulin recently.   Worked again for [name].   Dec. 2, 2016:  Doing well from a cardiac standpoint.   BP is high today , was normal yesterday   Feb. 14, 2017:   Mr. Mohammad is seen back for a visit BP is a bit elevated today .   Eats lots of salty foods ( soup for dinner) He had acute cholecystitis and had a perc drain placed.  The drain has been removed.  He never had his GB removed.  Was placed on home O2 during the hospitalization  And has been on home O2 since He does not think he needs it.   He occasionally will forget to turn it on and cannot tell the difference.  Will check his O2 sate on RA. ( time off oxygen  1440)   During his hospitalization, he had an echocardiogram that reveals normal left ventricle systolic function. He did have mild pulmonary hypertension. He had a stress Myoview study which revealed normal left ventricular systolic function and revealed no ischemia.  Aug. 17, 2017:  Doing well. Had the gall bladder percutaneous drain removed. Did not have GB removed.  Stands on his feet all day , has some leg edema   Feb. 13, 2018:  Doing well .   No further gall  bladder issues  Sept.  26, 2018:  Romey is seen today for follow-up of his coronary artery disease. Also  has a history of essential hypertension.  May 31, 2017 Doing well . Strawberries  Are ready - hes still working  No CP  No further gall bladder issues  August 29, 2017:  Ciro is seen today  Has been having lots of orthostasis  Lightheadness. Has not been working - works at [name] - in [employer] building  Has been having lots of GI issues ( belching and GERD after eating ) , thinks he is having gall bladder issues.  Appetite has not been good  Labs from [name] office show a creatinine of 2.3  Had 4 of his pills stopped last week  Taking fish oil, amlodipine, ASA and potassium ( by mistake, was supposed to stop)  , MVI  Still eating salty meats   Oct. 21, 2019:  Zailyn is seen today  Has not had a good appetite for the past several months  Has a left leg DVT - Is now on Eliquis  orthostasis has improved    Current Outpatient Medications on File Prior to Visit  Medication Sig Dispense Refill  . atenolol (TENORMIN) 25 MG  tablet Take 1 tablet (25 mg total) by mouth 2 (two) times daily. 60 tablet 11  . atorvastatin (LIPITOR) 40 MG tablet Take 40 mg by mouth daily.  3  . Blood Glucose Monitoring Suppl (ONETOUCH VERIO) w/Device KIT     . ELIQUIS 5 MG TABS tablet Take 5 mg by mouth 2 (two) times daily.  11  . insulin NPH-regular Human (NOVOLIN 70/30) (70-30) 100 UNIT/ML injection Inject 40-45 Units into the skin See admin instructions. Take 54 units in the morning and 48 units at bedtime.    . Lancets (ONETOUCH ULTRASOFT) lancets     . Multiple Vitamins-Minerals (CENTRUM SILVER PO) Take 1 tablet by mouth daily.    . NON FORMULARY Place 2 L into the nose continuous.    . Omega-3 Fatty Acids (FISH OIL) 1200 MG CAPS Take 2 capsules by mouth daily.     [name] omeprazole (PRILOSEC) 20 MG capsule Take 20 mg by mouth daily.     Glory Rosebush VERIO test strip     . RELION  INSULIN SYRINGE 1ML/31G 31G X 5/16" 1 ML MISC USE 1 TWICE DAILY AS DIRECTED  6   No current facility-administered medications on file prior to visit.     No Known Allergies  Past Medical History:  Diagnosis Date  . Acid reflux   . Aortic insufficiency   . Cholecystitis   . Coronary artery disease 2002   CABG x5  . Diabetes mellitus   . DVT (deep venous thrombosis) (HCC)    previously on coumadin  . Hypercholesteremia   . Hyperlipidemia   . Mitral valve regurgitation   . Skin cancer    tumor removed off of his back    Past Surgical History:  Procedure Laterality Date  . BILIARY DRAINAGE CATHETER PLACEMENT W/ BILE DUCT TUBE CHANGE  01/2015  . CORONARY ARTERY BYPASS GRAFT  12/21/2000   x5  . SKIN CANCER EXCISION    . TUMOR REMOVAL     off of back, skin cancer    Social History   Tobacco Use  Smoking Status Never Smoker  Smokeless Tobacco Never Used    Social History   Substance and Sexual Activity  Alcohol Use No    Family History  Problem Relation Age of Onset  . Heart attack Father   . Hypertension Mother   . Diabetes Brother   . Cancer Sister        breast  . Cancer Brother        throat & mouth    Reviw of Systems:  Noted in current history, otherwise review of systems is negative.   Physical Exam: Blood pressure 134/62, pulse 60, height 6' (1.829 m), weight 198 lb (89.8 kg), SpO2 95 %.  GEN:  Well nourished, well developed in no acute distress HEENT: Normal NECK: No JVD; No carotid bruits LYMPHATICS: No lymphadenopathy CARDIAC: RRR  , soft systolic murmur  RESPIRATORY:  Clear to auscultation without rales, wheezing or rhonchi  ABDOMEN: Soft, non-tender, non-distended MUSCULOSKELETAL:  1+ edema of left leg  SKIN: Warm and dry NEUROLOGIC:  Alert and oriented x 3   ECG:      Assessment / Plan:   1. Coronary artery disease-    denies any episodes of angina.  2.  Orthostatic hypotension:   Better   3.  Left leg DVT :   On Eliquis    2. Aortic insufficiency -  Stable   3. Hypercholesterolemia-   Will have labs checked soon  Return in 6 months   [name], MD  12/05/2017 8:08 AM    [facility],  [place], [place]  [address] Pager [phone] Phone: [phone]; Fax: [phone]

## 2017-12-08 DIAGNOSIS — R600 Localized edema: Secondary | ICD-10-CM | POA: Diagnosis not present

## 2017-12-08 DIAGNOSIS — Z6827 Body mass index (BMI) 27.0-27.9, adult: Secondary | ICD-10-CM | POA: Diagnosis not present

## 2017-12-08 DIAGNOSIS — I82402 Acute embolism and thrombosis of unspecified deep veins of left lower extremity: Secondary | ICD-10-CM | POA: Diagnosis not present

## 2017-12-08 DIAGNOSIS — I10 Essential (primary) hypertension: Secondary | ICD-10-CM | POA: Diagnosis not present

## 2017-12-23 DIAGNOSIS — Z125 Encounter for screening for malignant neoplasm of prostate: Secondary | ICD-10-CM | POA: Diagnosis not present

## 2017-12-23 DIAGNOSIS — N183 Chronic kidney disease, stage 3 (moderate): Secondary | ICD-10-CM | POA: Diagnosis not present

## 2017-12-23 DIAGNOSIS — R82998 Other abnormal findings in urine: Secondary | ICD-10-CM | POA: Diagnosis not present

## 2017-12-23 DIAGNOSIS — E1129 Type 2 diabetes mellitus with other diabetic kidney complication: Secondary | ICD-10-CM | POA: Diagnosis not present

## 2017-12-23 DIAGNOSIS — E7849 Other hyperlipidemia: Secondary | ICD-10-CM | POA: Diagnosis not present

## 2017-12-30 DIAGNOSIS — Z794 Long term (current) use of insulin: Secondary | ICD-10-CM | POA: Diagnosis not present

## 2017-12-30 DIAGNOSIS — I10 Essential (primary) hypertension: Secondary | ICD-10-CM | POA: Diagnosis not present

## 2017-12-30 DIAGNOSIS — Z1212 Encounter for screening for malignant neoplasm of rectum: Secondary | ICD-10-CM | POA: Diagnosis not present

## 2017-12-30 DIAGNOSIS — E1129 Type 2 diabetes mellitus with other diabetic kidney complication: Secondary | ICD-10-CM | POA: Diagnosis not present

## 2017-12-30 DIAGNOSIS — Z0000 Encounter for general adult medical examination without abnormal findings: Secondary | ICD-10-CM | POA: Diagnosis not present

## 2017-12-30 DIAGNOSIS — I82402 Acute embolism and thrombosis of unspecified deep veins of left lower extremity: Secondary | ICD-10-CM | POA: Diagnosis not present

## 2017-12-30 DIAGNOSIS — N183 Chronic kidney disease, stage 3 (moderate): Secondary | ICD-10-CM | POA: Diagnosis not present

## 2017-12-30 DIAGNOSIS — Z6827 Body mass index (BMI) 27.0-27.9, adult: Secondary | ICD-10-CM | POA: Diagnosis not present

## 2017-12-30 DIAGNOSIS — I951 Orthostatic hypotension: Secondary | ICD-10-CM | POA: Diagnosis not present

## 2017-12-30 DIAGNOSIS — I351 Nonrheumatic aortic (valve) insufficiency: Secondary | ICD-10-CM | POA: Diagnosis not present

## 2017-12-30 DIAGNOSIS — I25810 Atherosclerosis of coronary artery bypass graft(s) without angina pectoris: Secondary | ICD-10-CM | POA: Diagnosis not present

## 2017-12-30 DIAGNOSIS — E7849 Other hyperlipidemia: Secondary | ICD-10-CM | POA: Diagnosis not present

## 2018-01-27 ENCOUNTER — Ambulatory Visit: Payer: PPO | Admitting: Podiatry

## 2018-01-27 ENCOUNTER — Encounter: Payer: Self-pay | Admitting: Podiatry

## 2018-01-27 DIAGNOSIS — B351 Tinea unguium: Secondary | ICD-10-CM | POA: Diagnosis not present

## 2018-01-27 DIAGNOSIS — M79676 Pain in unspecified toe(s): Secondary | ICD-10-CM

## 2018-01-27 DIAGNOSIS — E1142 Type 2 diabetes mellitus with diabetic polyneuropathy: Secondary | ICD-10-CM | POA: Diagnosis not present

## 2018-01-27 DIAGNOSIS — D689 Coagulation defect, unspecified: Secondary | ICD-10-CM

## 2018-01-27 NOTE — Progress Notes (Signed)
Patient ID: Nathan [name], male   DOB: [birth year], 84 y.o.   MRN: [id number] Complaint:  Visit Type: Patient returns to my office for continued preventative foot care services. Complaint: Patient states" my nails have grown long and thick and become painful to walk and wear shoes" Patient has been diagnosed with DM with no foot complications. The patient presents for preventative foot care services. No changes to ROS.  Patient is on eliquiss.  Podiatric Exam: Vascular: dorsalis pedis and posterior tibial pulses are palpable bilateral. Capillary return is immediate. Temperature gradient is WNL. Skin turgor WNL  Sensorium: Diminished  Semmes Weinstein monofilament test. Normal tactile sensation bilaterally. Nail Exam: Pt has thick disfigured discolored nails with subungual debris noted bilateral entire nail hallux through fifth toenails Ulcer Exam: There is no evidence of ulcer or pre-ulcerative changes or infection. Orthopedic Exam: Muscle tone and strength are WNL. No limitations in general ROM. No crepitus or effusions noted. Foot type and digits show no abnormalities. Bony prominences are unremarkable.Hammer toes second B/l.  MCJ DJD  B/L. Skin: No Porokeratosis. No infection or ulcers.   Diagnosis:  Onychomycosis, , Pain in right toe, pain in left toes  Treatment & Plan Procedures and Treatment: Consent by patient was obtained for treatment procedures. The patient understood the discussion of treatment and procedures well. All questions were answered thoroughly reviewed. Debridement of mycotic and hypertrophic toenails, 1 through 5 bilateral and clearing of subungual debris. No ulceration, no infection noted.   Return Visit-Office Procedure: Patient instructed to return to the office for a follow up visit 3 months for continued evaluation and treatment.   Gardiner Barefoot DPM

## 2018-02-01 DIAGNOSIS — C4401 Basal cell carcinoma of skin of lip: Secondary | ICD-10-CM | POA: Diagnosis not present

## 2018-02-01 DIAGNOSIS — D485 Neoplasm of uncertain behavior of skin: Secondary | ICD-10-CM | POA: Diagnosis not present

## 2018-02-14 DIAGNOSIS — E1129 Type 2 diabetes mellitus with other diabetic kidney complication: Secondary | ICD-10-CM | POA: Diagnosis not present

## 2018-02-14 DIAGNOSIS — N183 Chronic kidney disease, stage 3 (moderate): Secondary | ICD-10-CM | POA: Diagnosis not present

## 2018-02-14 DIAGNOSIS — Z794 Long term (current) use of insulin: Secondary | ICD-10-CM | POA: Diagnosis not present

## 2018-02-14 DIAGNOSIS — I10 Essential (primary) hypertension: Secondary | ICD-10-CM | POA: Diagnosis not present

## 2018-03-02 DIAGNOSIS — E1129 Type 2 diabetes mellitus with other diabetic kidney complication: Secondary | ICD-10-CM | POA: Diagnosis not present

## 2018-03-02 DIAGNOSIS — Z6826 Body mass index (BMI) 26.0-26.9, adult: Secondary | ICD-10-CM | POA: Diagnosis not present

## 2018-03-02 DIAGNOSIS — J069 Acute upper respiratory infection, unspecified: Secondary | ICD-10-CM | POA: Diagnosis not present

## 2018-03-14 DIAGNOSIS — L570 Actinic keratosis: Secondary | ICD-10-CM | POA: Diagnosis not present

## 2018-03-14 DIAGNOSIS — C4441 Basal cell carcinoma of skin of scalp and neck: Secondary | ICD-10-CM | POA: Diagnosis not present

## 2018-04-03 ENCOUNTER — Telehealth: Payer: Self-pay | Admitting: Cardiovascular Disease

## 2018-04-03 NOTE — Telephone Encounter (Signed)
Follow up   PT is returning phone call

## 2018-04-03 NOTE — Telephone Encounter (Signed)
New Message   Pt c/o of Chest Pain: STAT if CP now or developed within 24 hours  1. Are you having CP right now? no  2. Are you experiencing any other symptoms (ex. SOB, nausea, vomiting, sweating)? Shortness of breath, nausea periodically,   3. How long have you been experiencing CP? 2-3 weeks 4. Is your CP continuous or coming and going? Coming and going   5. Have you taken Nitroglycerin? No   Patient states that the chest pain is intermittent. He has had no pains today. I did schedule him to see Richardson Dopp on 04/04/2018   ?

## 2018-04-03 NOTE — Telephone Encounter (Signed)
Left message for patient to ask if his symptoms were stable enough for him to wait for his appointment tomorrow. I advised him to call back

## 2018-04-03 NOTE — Telephone Encounter (Signed)
Followed up with patient. Pt denies current CP/SOB.   States SOB has been occurring for the last several weeks. He has also had recent respiratory illness and thinks there could be some correlation to some of his chest discomfort/pain. Pt states he is fine to wait until tomorrow's OV. Pt advised to go to ED is SOB/CP changes/worsens in nature.  Pt agreeable to plan.

## 2018-04-04 ENCOUNTER — Encounter: Payer: Self-pay | Admitting: Physician Assistant

## 2018-04-04 ENCOUNTER — Ambulatory Visit: Payer: PPO | Admitting: Physician Assistant

## 2018-04-04 ENCOUNTER — Encounter (INDEPENDENT_AMBULATORY_CARE_PROVIDER_SITE_OTHER): Payer: Self-pay

## 2018-04-04 VITALS — BP 140/80 | HR 74 | Ht 72.0 in | Wt 201.8 lb

## 2018-04-04 DIAGNOSIS — E7800 Pure hypercholesterolemia, unspecified: Secondary | ICD-10-CM | POA: Diagnosis not present

## 2018-04-04 DIAGNOSIS — I351 Nonrheumatic aortic (valve) insufficiency: Secondary | ICD-10-CM

## 2018-04-04 DIAGNOSIS — I25119 Atherosclerotic heart disease of native coronary artery with unspecified angina pectoris: Secondary | ICD-10-CM

## 2018-04-04 DIAGNOSIS — I10 Essential (primary) hypertension: Secondary | ICD-10-CM | POA: Diagnosis not present

## 2018-04-04 MED ORDER — ASPIRIN EC 81 MG PO TBEC: 81.0000 mg | DELAYED_RELEASE_TABLET | Freq: Every day | ORAL | 3 refills | Status: DC

## 2018-04-04 MED ORDER — ISOSORBIDE MONONITRATE ER 30 MG PO TB24: 30.0000 mg | ORAL_TABLET | Freq: Every day | ORAL | 3 refills | Status: DC

## 2018-04-04 MED ORDER — NITROGLYCERIN 0.4 MG SL SUBL: 0.4000 mg | SUBLINGUAL_TABLET | SUBLINGUAL | 5 refills | Status: DC | PRN

## 2018-04-04 NOTE — Progress Notes (Addendum)
Cardiology Office Note:    Date:  04/04/2018   ID:  Nathan [name], DOB 01/21/1934, MRN [id number]  PCP:  Leanna Battles, MD  Cardiologist:  [name], MD   Electrophysiologist:  None   Referring MD: Leanna Battles, MD   Chief Complaint  Patient presents with  . Chest Pain     History of Present Illness:    Nathan [name] is a 85 y.o. male with coronary artery disease s/p coronary artery bypass grafting, aortic insufficiency, mitral regurgitation, L leg DVT (on Apixaban), diabetes, hyperlipidemia.  Echo in 8/19 demonstrated normal EF, mod AI and mild MR.  Nathan [name] was last seen by Dr. Acie Fredrickson in 10/19.     Nathan [name] presents for evaluation of chest pain.  Nathan [name] is here alone.  Over the past 2-3 weeks, Nathan [name] has been having lower chest/epigastric discomfort.  This occurs with activity and Nathan [name] notes assoc bilateral arm pain.  This occurs from the elbow down to his wrist.  Nathan [name] has chronic shortness of breath and his breathing is worse with this discomfort. Nathan [name] denies rest symptoms. Nathan [name] has been having a lot of diarrhea recently.  Nathan [name] notes recurrent symptoms getting up from the commode or getting up from a seated position.  Rest improves his symptoms.  Nathan [name] has not had symptoms at rest.  Nathan [name] sleeps on 1 pillow.  Nathan [name] denies paroxysmal nocturnal dyspnea.  Nathan [name] has chronic leg swelling (L>R).  Nathan [name] denies syncope.  Nathan [name] has not had any melena, hematochezia, hematuria.    Prior CV studies:   The following studies were reviewed today:  Echo 09/29/17 Mild LVH, mild focal basal septal hypertrophy, EF 60-65, no RWMA, mod AI, mild MR, mod LAE, mild RVE, mod TR, PASP 48  Myoview 01/23/15  There was no ST segment deviation noted during stress.  The study is normal.  This is a low risk study.  The left ventricular ejection fraction is normal (55-65%). There is attenuation in basal and mid inferolateral and anterolateral stress and rest images and no evidence of ischemia. Overall LVEF 65%.   Echo 01/22/15 Mod focal  basal septal hypertrophy, EF 55-60, no RWMA, Gr 1 DD, mod AI, Ao root 41 mm, mild MR, mild LAE, PASP 37  Myoview 10/06/11 Overall Impression:  Normal stress nuclear study.  No perfusion defects though there was 1 mm ST depression in 2 leads on Lexiscan ECG.  Suspect this was a false positive finding given the perfusion images.  LV Ejection Fraction: Not gated.  LV Wall Motion:  Not gated  Cardiac Catheterization 12/19/2000 ANGIOGRAPHY: The left main coronary artery has a 60% stenosis in the distal segment. The proximal left main is normal. The left anterior descending artery has a proximal irregular 60% stenosis. The stenosis is fairly long and encompasses the entire proximal LAD. There is a 70-80% stenosis in the mid LAD just after giving off the first diagonal branch. The distal left anterior descending is fairly unremarkable. The first diagonal vessel is a fairly large vessel and only has minor luminal irregularities. The left circumflex artery is a moderate sized vessel. There is a proximal 80-90% stenosis. The circumflex then gives off a moderate sized obtuse marginal artery, which has a 90-95% stenosis proximally. There is a stenosis in the circumflex artery just after giving of the first obtuse marginal artery of about 80%. The right coronary artery is large and dominant. There is an 80% mid stenosis. The posterior descending artery is unremarkable. The posterolateral segment artery has a 50-60% stenosis  at its mid point. The left subclavian artery is unremarkable. The left internal mammary artery is normal. The left vertebral artery has a 30-40% stenosis in the proximal segment. LEFT VENTRICULOGRAM: The left ventriculogram was performed in a 30 RAO position. It reveals no segmental wall motion abnormalities. There is a trace amount of catheter-induced mitral regurgitation. COMPLICATIONS: None. CONCLUSIONS: Three-vessel coronary artery disease. The fact that the patient had a  positive treadmill, but a negative Cardiolite scan suggest that Nathan [name] had a matched defect. The catheterization confirms this theory. Nathan [name] will probably need coronary artery bypass grafting. Nathan [name] has well preserved left ventricular systolic function. Dictated by:   Ramond Dial., M.D  Past Medical History:  Diagnosis Date  . Acid reflux   . Aortic insufficiency   . Cholecystitis   . Coronary artery disease 2002   CABG x5  . Diabetes mellitus   . DVT (deep venous thrombosis) (HCC)    previously on coumadin  . Hypercholesteremia   . Hyperlipidemia   . Mitral valve regurgitation   . Skin cancer    tumor removed off of his back   Surgical Hx: The patient  has a past surgical history that includes Coronary artery bypass graft (12/21/2000); Tumor removal; Skin cancer excision; and Biliary drainage catheter placement w/ bile duct tube change (01/2015).   Current Medications: Current Meds  Medication Sig  . atenolol (TENORMIN) 25 MG tablet Take 1 tablet (25 mg total) by mouth 2 (two) times daily.  [name] atorvastatin (LIPITOR) 40 MG tablet Take 40 mg by mouth daily.  . Blood Glucose Monitoring Suppl (ONETOUCH VERIO) w/Device KIT   . ELIQUIS 5 MG TABS tablet Take 5 mg by mouth 2 (two) times daily.  . insulin NPH-regular Human (NOVOLIN 70/30) (70-30) 100 UNIT/ML injection Inject 40-45 Units into the skin See admin instructions.   . Lancets (ONETOUCH ULTRASOFT) lancets   . Multiple Vitamins-Minerals (CENTRUM SILVER PO) Take 1 tablet by mouth daily.  . Omega-3 Fatty Acids (FISH OIL) 1200 MG CAPS Take 2 capsules by mouth daily.   [name] omeprazole (PRILOSEC) 20 MG capsule Take 20 mg by mouth daily.   Glory Rosebush VERIO test strip   . RELION INSULIN SYRINGE 1ML/31G 31G X 5/16" 1 ML MISC USE 1 TWICE DAILY AS DIRECTED  . [DISCONTINUED] NON FORMULARY Place 2 L into the nose continuous.     Allergies:   Patient has no known allergies.   Social History   Tobacco Use  . Smoking status: Never Smoker    . Smokeless tobacco: Never Used  Substance Use Topics  . Alcohol use: No  . Drug use: No     Family Hx: The patient's family history includes Cancer in his brother and sister; Diabetes in his brother; Heart attack in his father; Hypertension in his mother.  ROS:   Please see the history of present illness.    Review of Systems  Constitution: Positive for malaise/fatigue.  Cardiovascular: Positive for chest pain.  Respiratory: Positive for cough.   Neurological: Positive for dizziness.   All other systems reviewed and are negative.   EKGs/Labs/Other Test Reviewed:    EKG:  EKG is  ordered today.  The ekg ordered today demonstrates normal sinus rhythm, heart rate 74, left axis deviation, right bundle branch block, T wave inversions 1, aVL, ST abnormality V4-V5, QTC 512  Recent Labs: 08/29/2017: BUN 36; Creatinine, Ser 1.97; Potassium 4.1; Sodium 137   Recent Lipid Panel Lab Results  Component Value Date/Time  CHOL 143 11/10/2016 08:29 AM   TRIG 128 11/10/2016 08:29 AM   HDL 36 (L) 11/10/2016 08:29 AM   CHOLHDL 4.0 11/10/2016 08:29 AM   CHOLHDL 4.9 10/02/2015 07:33 AM   LDLCALC 81 11/10/2016 08:29 AM   LDLDIRECT 77.1 01/16/2013 04:07 PM    Physical Exam:    VS:  BP 140/80   Pulse 74   Ht 6' (1.829 m)   Wt 201 lb 12.8 oz (91.5 kg)   SpO2 94%   BMI 27.37 kg/m     Wt Readings from Last 3 Encounters:  04/04/18 201 lb 12.8 oz (91.5 kg)  12/05/17 198 lb (89.8 kg)  09/13/17 199 lb 8 oz (90.5 kg)     Physical Exam  Constitutional: Nathan [name] is oriented to person, place, and time. Nathan [name] appears well-developed and well-nourished. No distress.  HENT:  Head: Normocephalic and atraumatic.  Eyes: No scleral icterus.  Neck: Neck supple. No JVD present. No thyromegaly present.  Cardiovascular: Normal rate, regular rhythm, S1 normal and S2 normal.  No murmur heard. Pulmonary/Chest: Breath sounds normal. Nathan [name] has no rales.  Abdominal: Soft. There is abdominal tenderness in the right  upper quadrant and epigastric area.  Musculoskeletal:        General: Edema (1+ bilat LE edema, L > R) present.  Lymphadenopathy:    Nathan [name] has no cervical adenopathy.  Neurological: Nathan [name] is alert and oriented to person, place, and time.  Skin: Skin is warm and dry.  Psychiatric: Nathan [name] has a normal mood and affect.    ASSESSMENT & PLAN:    Coronary artery disease involving native coronary artery of native heart with angina pectoris ([place]) Hx of CABG in 2002.  Myoview in 2016 was low risk.  Nathan [name] now presents with epigastric discomfort that occurs with exertion and is assoc with bilat arm discomfort, shortness of breath.  His ECG is changed from the last tracing.  Nathan [name] is having increase bowel movement frequency and his abdomen is somewhat tender on exam.  His symptoms are concerning for unstable angina. But, I cannot rule out a GI pathology.  I have reviewed his case with Dr. Acie Fredrickson today.  Since his symptoms are suggestive of unstable angina and his ECG has clearly changed, we have recommended proceeding with Cardiac Catheterization.  Risks and benefits of cardiac catheterization have been discussed with the patient.  These include bleeding, infection, kidney damage, stroke, heart attack, death.  The patient understands these risks and is willing to proceed.   -Arrange L heart cath  -BMET, CBC today  -Nathan [name] does not take PDE-5 inhibitors; start Imdur 30 mg QD  -NTG prn  -Start ASA 81 mg QD  -Continue beta-blocker, statin  -Hold Eliquis 2 days prior to cath and resume post op when safe  Aortic valve insufficiency, etiology of cardiac valve disease unspecified Mod by Echo 09/2017.    Essential hypertension BP elevated today.  Add Imdur as noted.   Hypercholesteremia Continue statin Rx.   Dispo:  Return in about 2 weeks (around 04/18/2018) for Post Procedure Follow Up, w/ Dr. Acie Fredrickson, or Richardson Dopp, PA-C.   Medication Adjustments/Labs and Tests Ordered: Current medicines are reviewed at length with the  patient today.  Concerns regarding medicines are outlined above.  Tests Ordered: Orders Placed This Encounter  Procedures  . Basic metabolic panel  . CBC  . EKG 12-Lead   Medication Changes: Meds ordered this encounter  Medications  . isosorbide mononitrate (IMDUR) 30 MG 24 hr tablet    Sig:  Take 1 tablet (30 mg total) by mouth daily.    Dispense:  90 tablet    Refill:  3  . nitroGLYCERIN (NITROSTAT) 0.4 MG SL tablet    Sig: Place 1 tablet (0.4 mg total) under the tongue every 5 (five) minutes as needed for chest pain.    Dispense:  25 tablet    Refill:  5  . aspirin EC 81 MG tablet    Sig: Take 1 tablet (81 mg total) by mouth daily.    Dispense:  90 tablet    Refill:  3    Signed, Richardson Dopp, PA-C  04/04/2018 4:51 PM    [facility] [place], [place], [place]  [address] Phone: [phone]; Fax: [phone]    Attending Note:   The patient was seen and examined.  Agree with assessment and plan as noted above.  Changes made to the above note as needed.  Patient seen and independently examined with Richardson Dopp, PA .   We discussed all aspects of the encounter. I agree with the assessment and plan as stated above.  1.   Pt presents with some chest pain .  ECG shows some new TWI  I agree with plans for cath     I have spent a total of 40 minutes with patient reviewing hospital  notes , telemetry, EKGs, labs and examining patient as well as establishing an assessment and plan that was discussed with the patient. > 50% of time was spent in direct patient care.    Thayer Headings, Brooke Bonito., MD, [facility] 04/05/2018, 10:56 AM [address]  [place] Pager [phone]

## 2018-04-04 NOTE — H&P (View-Only) (Signed)
Cardiology Office Note:    Date:  04/04/2018   ID:  Nathan [name], DOB [birth year], MRN [id number]  PCP:  Leanna Battles, MD  Cardiologist:  [name], MD   Electrophysiologist:  None   Referring MD: Leanna Battles, MD   Chief Complaint  Patient presents with  . Chest Pain     History of Present Illness:    Nathan [name] is a 85 y.o. male with coronary artery disease s/p coronary artery bypass grafting, aortic insufficiency, mitral regurgitation, L leg DVT (on Apixaban), diabetes, hyperlipidemia.  Echo in 8/19 demonstrated normal EF, mod AI and mild MR.  He was last seen by Dr. Acie Fredrickson in 10/19.     Nathan [name] presents for evaluation of chest pain.  He is here alone.  Over the past 2-3 weeks, he has been having lower chest/epigastric discomfort.  This occurs with activity and he notes assoc bilateral arm pain.  This occurs from the elbow down to his wrist.  He has chronic shortness of breath and his breathing is worse with this discomfort. He denies rest symptoms. He has been having a lot of diarrhea recently.  He notes recurrent symptoms getting up from the commode or getting up from a seated position.  Rest improves his symptoms.  He has not had symptoms at rest.  He sleeps on 1 pillow.  He denies paroxysmal nocturnal dyspnea.  He has chronic leg swelling (L>R).  He denies syncope.  He has not had any melena, hematochezia, hematuria.    Prior CV studies:   The following studies were reviewed today:  Echo 09/29/17 Mild LVH, mild focal basal septal hypertrophy, EF 60-65, no RWMA, mod AI, mild MR, mod LAE, mild RVE, mod TR, PASP 48  Myoview 01/23/15  There was no ST segment deviation noted during stress.  The study is normal.  This is a low risk study.  The left ventricular ejection fraction is normal (55-65%). There is attenuation in basal and mid inferolateral and anterolateral stress and rest images and no evidence of ischemia. Overall LVEF 65%.   Echo 01/22/15 Mod focal  basal septal hypertrophy, EF 55-60, no RWMA, Gr 1 DD, mod AI, Ao root 41 mm, mild MR, mild LAE, PASP 37  Myoview 10/06/11 Overall Impression:  Normal stress nuclear study.  No perfusion defects though there was 1 mm ST depression in 2 leads on Lexiscan ECG.  Suspect this was a false positive finding given the perfusion images.  LV Ejection Fraction: Not gated.  LV Wall Motion:  Not gated  Cardiac Catheterization 12/19/2000 ANGIOGRAPHY: The left main coronary artery has a 60% stenosis in the distal segment. The proximal left main is normal. The left anterior descending artery has a proximal irregular 60% stenosis. The stenosis is fairly long and encompasses the entire proximal LAD. There is a 70-80% stenosis in the mid LAD just after giving off the first diagonal branch. The distal left anterior descending is fairly unremarkable. The first diagonal vessel is a fairly large vessel and only has minor luminal irregularities. The left circumflex artery is a moderate sized vessel. There is a proximal 80-90% stenosis. The circumflex then gives off a moderate sized obtuse marginal artery, which has a 90-95% stenosis proximally. There is a stenosis in the circumflex artery just after giving of the first obtuse marginal artery of about 80%. The right coronary artery is large and dominant. There is an 80% mid stenosis. The posterior descending artery is unremarkable. The posterolateral segment artery has a 50-60% stenosis  at its mid point. The left subclavian artery is unremarkable. The left internal mammary artery is normal. The left vertebral artery has a 30-40% stenosis in the proximal segment. LEFT VENTRICULOGRAM: The left ventriculogram was performed in a 30 RAO position. It reveals no segmental wall motion abnormalities. There is a trace amount of catheter-induced mitral regurgitation. COMPLICATIONS: None. CONCLUSIONS: Three-vessel coronary artery disease. The fact that the patient had a  positive treadmill, but a negative Cardiolite scan suggest that he had a matched defect. The catheterization confirms this theory. He will probably need coronary artery bypass grafting. He has well preserved left ventricular systolic function. Dictated by:   Ramond Dial., M.D  Past Medical History:  Diagnosis Date  . Acid reflux   . Aortic insufficiency   . Cholecystitis   . Coronary artery disease 2002   CABG x5  . Diabetes mellitus   . DVT (deep venous thrombosis) (HCC)    previously on coumadin  . Hypercholesteremia   . Hyperlipidemia   . Mitral valve regurgitation   . Skin cancer    tumor removed off of his back   Surgical Hx: The patient  has a past surgical history that includes Coronary artery bypass graft (12/21/2000); Tumor removal; Skin cancer excision; and Biliary drainage catheter placement w/ bile duct tube change (01/2015).   Current Medications: Current Meds  Medication Sig  . atenolol (TENORMIN) 25 MG tablet Take 1 tablet (25 mg total) by mouth 2 (two) times daily.  [name] atorvastatin (LIPITOR) 40 MG tablet Take 40 mg by mouth daily.  . Blood Glucose Monitoring Suppl (ONETOUCH VERIO) w/Device KIT   . ELIQUIS 5 MG TABS tablet Take 5 mg by mouth 2 (two) times daily.  . insulin NPH-regular Human (NOVOLIN 70/30) (70-30) 100 UNIT/ML injection Inject 40-45 Units into the skin See admin instructions.   . Lancets (ONETOUCH ULTRASOFT) lancets   . Multiple Vitamins-Minerals (CENTRUM SILVER PO) Take 1 tablet by mouth daily.  . Omega-3 Fatty Acids (FISH OIL) 1200 MG CAPS Take 2 capsules by mouth daily.   [name] omeprazole (PRILOSEC) 20 MG capsule Take 20 mg by mouth daily.   Glory Rosebush VERIO test strip   . RELION INSULIN SYRINGE 1ML/31G 31G X 5/16" 1 ML MISC USE 1 TWICE DAILY AS DIRECTED  . [DISCONTINUED] NON FORMULARY Place 2 L into the nose continuous.     Allergies:   Patient has no known allergies.   Social History   Tobacco Use  . Smoking status: Never Smoker    . Smokeless tobacco: Never Used  Substance Use Topics  . Alcohol use: No  . Drug use: No     Family Hx: The patient's family history includes Cancer in his brother and sister; Diabetes in his brother; Heart attack in his father; Hypertension in his mother.  ROS:   Please see the history of present illness.    Review of Systems  Constitution: Positive for malaise/fatigue.  Cardiovascular: Positive for chest pain.  Respiratory: Positive for cough.   Neurological: Positive for dizziness.   All other systems reviewed and are negative.   EKGs/Labs/Other Test Reviewed:    EKG:  EKG is  ordered today.  The ekg ordered today demonstrates normal sinus rhythm, heart rate 74, left axis deviation, right bundle branch block, T wave inversions 1, aVL, ST abnormality V4-V5, QTC 512  Recent Labs: 08/29/2017: BUN 36; Creatinine, Ser 1.97; Potassium 4.1; Sodium 137   Recent Lipid Panel Lab Results  Component Value Date/Time  CHOL 143 11/10/2016 08:29 AM   TRIG 128 11/10/2016 08:29 AM   HDL 36 (L) 11/10/2016 08:29 AM   CHOLHDL 4.0 11/10/2016 08:29 AM   CHOLHDL 4.9 10/02/2015 07:33 AM   LDLCALC 81 11/10/2016 08:29 AM   LDLDIRECT 77.1 01/16/2013 04:07 PM    Physical Exam:    VS:  BP 140/80   Pulse 74   Ht 6' (1.829 m)   Wt 201 lb 12.8 oz (91.5 kg)   SpO2 94%   BMI 27.37 kg/m     Wt Readings from Last 3 Encounters:  04/04/18 201 lb 12.8 oz (91.5 kg)  12/05/17 198 lb (89.8 kg)  09/13/17 199 lb 8 oz (90.5 kg)     Physical Exam  Constitutional: He is oriented to person, place, and time. He appears well-developed and well-nourished. No distress.  HENT:  Head: Normocephalic and atraumatic.  Eyes: No scleral icterus.  Neck: Neck supple. No JVD present. No thyromegaly present.  Cardiovascular: Normal rate, regular rhythm, S1 normal and S2 normal.  No murmur heard. Pulmonary/Chest: Breath sounds normal. He has no rales.  Abdominal: Soft. There is abdominal tenderness in the right  upper quadrant and epigastric area.  Musculoskeletal:        General: Edema (1+ bilat LE edema, L > R) present.  Lymphadenopathy:    He has no cervical adenopathy.  Neurological: He is alert and oriented to person, place, and time.  Skin: Skin is warm and dry.  Psychiatric: He has a normal mood and affect.    ASSESSMENT & PLAN:    Coronary artery disease involving native coronary artery of native heart with angina pectoris ([place]) Hx of CABG in 2002.  Myoview in 2016 was low risk.  He now presents with epigastric discomfort that occurs with exertion and is assoc with bilat arm discomfort, shortness of breath.  His ECG is changed from the last tracing.  He is having increase bowel movement frequency and his abdomen is somewhat tender on exam.  His symptoms are concerning for unstable angina. But, I cannot rule out a GI pathology.  I have reviewed his case with Dr. Acie Fredrickson today.  Since his symptoms are suggestive of unstable angina and his ECG has clearly changed, we have recommended proceeding with Cardiac Catheterization.  Risks and benefits of cardiac catheterization have been discussed with the patient.  These include bleeding, infection, kidney damage, stroke, heart attack, death.  The patient understands these risks and is willing to proceed.   -Arrange L heart cath  -BMET, CBC today  -He does not take PDE-5 inhibitors; start Imdur 30 mg QD  -NTG prn  -Start ASA 81 mg QD  -Continue beta-blocker, statin  -Hold Eliquis 2 days prior to cath and resume post op when safe  Aortic valve insufficiency, etiology of cardiac valve disease unspecified Mod by Echo 09/2017.    Essential hypertension BP elevated today.  Add Imdur as noted.   Hypercholesteremia Continue statin Rx.   Dispo:  Return in about 2 weeks (around 04/18/2018) for Post Procedure Follow Up, w/ Dr. Acie Fredrickson, or Richardson Dopp, PA-C.   Medication Adjustments/Labs and Tests Ordered: Current medicines are reviewed at length with the  patient today.  Concerns regarding medicines are outlined above.  Tests Ordered: Orders Placed This Encounter  Procedures  . Basic metabolic panel  . CBC  . EKG 12-Lead   Medication Changes: Meds ordered this encounter  Medications  . isosorbide mononitrate (IMDUR) 30 MG 24 hr tablet    Sig:  Take 1 tablet (30 mg total) by mouth daily.    Dispense:  90 tablet    Refill:  3  . nitroGLYCERIN (NITROSTAT) 0.4 MG SL tablet    Sig: Place 1 tablet (0.4 mg total) under the tongue every 5 (five) minutes as needed for chest pain.    Dispense:  25 tablet    Refill:  5  . aspirin EC 81 MG tablet    Sig: Take 1 tablet (81 mg total) by mouth daily.    Dispense:  90 tablet    Refill:  3    Signed, Richardson Dopp, PA-C  04/04/2018 4:51 PM    [facility] [place], [place], [place]  [address] Phone: [phone]; Fax: [phone]    Attending Note:   The patient was seen and examined.  Agree with assessment and plan as noted above.  Changes made to the above note as needed.  Patient seen and independently examined with Richardson Dopp, PA .   We discussed all aspects of the encounter. I agree with the assessment and plan as stated above.  1.   Pt presents with some chest pain .  ECG shows some new TWI  I agree with plans for cath     I have spent a total of 40 minutes with patient reviewing hospital  notes , telemetry, EKGs, labs and examining patient as well as establishing an assessment and plan that was discussed with the patient. > 50% of time was spent in direct patient care.    Thayer Headings, Brooke Bonito., MD, [facility] 04/05/2018, 10:56 AM [address]  [place] Pager [phone]

## 2018-04-04 NOTE — Patient Instructions (Addendum)
Medication Instructions:  Your physician has recommended you make the following change in your medication:  1. START IMDUR 30 MG DAILY.  2. START ASPIRIN 81 MG DAILY.  3. START NITROGLYCERIN SUBLINGUAL  0.4 MG AS NEEDED FOR CHEST PAIN.   If you need a refill on your cardiac medications before your next appointment, please call your pharmacy.   Lab work: TODAY: BMET, CBC  If you have labs (blood work) drawn today and your tests are completely normal, you will receive your results only by: [name] MyChart Message (if you have MyChart) OR . A paper copy in the mail If you have any lab test that is abnormal or we need to change your treatment, we will call you to review the results.  Testing/Procedures: Your physician has requested that you have a cardiac catheterization. Cardiac catheterization is used to diagnose and/or treat various heart conditions. Doctors may recommend this procedure for a number of different reasons. The most common reason is to evaluate chest pain. Chest pain can be a symptom of coronary artery disease (CAD), and cardiac catheterization can show whether plaque is narrowing or blocking your heart's arteries. This procedure is also used to evaluate the valves, as well as measure the blood flow and oxygen levels in different parts of your heart. For further information please visit [employer]uge[employer]iesta[employer]tn. Please follow instruction sheet, as given.    Follow-Up: At [facility], you and your health needs are our priority.  As part of our continuing mission to provide you with exceptional heart care, we have created designated Provider Care Teams.  These Care Teams include your primary Cardiologist (physician) and Advanced Practice Providers (APPs -  Physician Assistants and Nurse Practitioners) who all work together to provide you with the care you need, when you need it. . You will need a follow up appointment in:  2 weeks with Richardson Dopp, PA-C   Any Other Special  Instructions Will Be Listed Below (If Applicable).     [facility] OFFICE [place], [place] [address] Dept: [phone] Loc: [place]  04/04/2018  You are scheduled for a Cardiac Catheterization on Thursday, February 20 with Dr. Quay Burow.  1. Please arrive at the [facility] (Main Entrance A) at [facility]: [address] [place], [place] [address] at 9:30 AM (This time is two hours before your procedure to ensure your preparation). Free valet parking service is available.   Special note: Every effort is made to have your procedure done on time. Please understand that emergencies sometimes delay scheduled procedures.  2. Diet: Do not eat solid foods after midnight.  The patient may have clear liquids until 5am upon the day of the procedure.  3. Labs: You will need to have blood drawn on Tuesday, February 18 at [facility]. [address]  Open: 7:30am - 5pm    Phone: [phone]. You do not need to be fasting.  4. Medication instructions in preparation for your procedure:   Contrast Allergy: No   Stop taking Eliquis (Apixiban) on Tuesday, February 18.  Take only 20-25 units of insulin the night before your procedure. Do not take any insulin on the day of the the procedure.  On the morning of your procedure, take your Aspirin 81 mg and any morning medicines NOT listed above.  You may use sips of water.  5. Plan for one  night stay--bring personal belongings. 6. Bring a current list of your medications and current insurance cards. 7. You MUST have a responsible person to drive you home. 8. Someone MUST be with you the first 24 hours after you arrive home or your discharge will be delayed. 9. Please wear clothes that are easy to get on and off and wear slip-on shoes.  Thank you for allowing [place] to care for  you!   -- [facility]

## 2018-04-05 ENCOUNTER — Telehealth: Payer: Self-pay

## 2018-04-05 ENCOUNTER — Telehealth: Payer: Self-pay | Admitting: *Deleted

## 2018-04-05 DIAGNOSIS — C4441 Basal cell carcinoma of skin of scalp and neck: Secondary | ICD-10-CM | POA: Diagnosis not present

## 2018-04-05 DIAGNOSIS — C4401 Basal cell carcinoma of skin of lip: Secondary | ICD-10-CM | POA: Diagnosis not present

## 2018-04-05 LAB — BASIC METABOLIC PANEL
BUN / CREAT RATIO: 13 (ref 10–24)
BUN: 21 mg/dL (ref 8–27)
CHLORIDE: 108 mmol/L — AB (ref 96–106)
CO2: 20 mmol/L (ref 20–29)
Calcium: 8.4 mg/dL — ABNORMAL LOW (ref 8.6–10.2)
Creatinine, Ser: 1.65 mg/dL — ABNORMAL HIGH (ref 0.76–1.27)
GFR calc Af Amer: 44 mL/min/{1.73_m2} — ABNORMAL LOW (ref >59)
GFR calc non Af Amer: 38 mL/min/{1.73_m2} — ABNORMAL LOW (ref >59)
GLUCOSE: 158 mg/dL — AB (ref 65–99)
Potassium: 3.8 mmol/L (ref 3.5–5.2)
SODIUM: 143 mmol/L (ref 134–144)

## 2018-04-05 LAB — CBC
Hematocrit: 41.1 % (ref 37.5–51.0)
Hemoglobin: 13.6 g/dL (ref 13.0–17.7)
MCH: 30 pg (ref 26.6–33.0)
MCHC: 33.1 g/dL (ref 31.5–35.7)
MCV: 91 fL (ref 79–97)
PLATELETS: 189 10*3/uL (ref 150–450)
RBC: 4.53 x10E6/uL (ref 4.14–5.80)
RDW: 13.3 % (ref 11.6–15.4)
WBC: 9.1 10*3/uL (ref 3.4–10.8)

## 2018-04-05 NOTE — Telephone Encounter (Signed)
Reviewed results with patient who states that Nathan [name] spoke with pt's daughter Nathan [name] ([place] on file) about pt's labs. See phone note from today (2/19). I did make pt aware that Nathan [name] does recommend that he f/u with PCP about low calcium and diabetes. Pt verbalized understanding and thanked me for the call. A copy has be sent to pt's PCP.

## 2018-04-05 NOTE — Telephone Encounter (Addendum)
Pt contacted pre-catheterization scheduled at [facility] for: Thursday April 06, 2018 11:30 AM Verified arrival time and place: [place] Entrance A at: 6:30 AM for pre procedure hydration  No solid food after midnight prior to cath, clear liquids until 5 AM day of procedure. Contrast allergy: no  Hold: Eliquis-04/04/18 until post procedure. Insulin-AM of procedure.  Except hold medications AM meds can be  taken pre-cath with sip of water including: ASA 81 mg  Confirm patient has responsible person to drive home post procedure and observe 24 hours after arriving home:yes  I was unable to get in touch with patient, I reviewed instructions, including instructions to arrive early for hydration ,  with patient's daughter, [name]) (she will be taking him to the hospital), she verbalized understanding, she will review with patient.

## 2018-04-06 ENCOUNTER — Other Ambulatory Visit: Payer: Self-pay

## 2018-04-06 ENCOUNTER — Encounter (HOSPITAL_COMMUNITY): Payer: Self-pay | Admitting: Cardiovascular Disease

## 2018-04-06 ENCOUNTER — Ambulatory Visit (HOSPITAL_COMMUNITY)
Admission: RE | Disposition: A | Discharge status: Home / Self Care | Payer: Self-pay | Source: Home / Self Care | Attending: Cardiovascular Disease

## 2018-04-06 ENCOUNTER — Ambulatory Visit (HOSPITAL_COMMUNITY)
Admission: RE | Admit: 2018-04-06 | Discharge: 2018-04-06 | Disposition: A | Discharge status: Home / Self Care | Payer: PPO | Attending: Cardiovascular Disease | Admitting: Cardiovascular Disease

## 2018-04-06 DIAGNOSIS — K219 Gastro-esophageal reflux disease without esophagitis: Secondary | ICD-10-CM | POA: Insufficient documentation

## 2018-04-06 DIAGNOSIS — I10 Essential (primary) hypertension: Secondary | ICD-10-CM | POA: Diagnosis not present

## 2018-04-06 DIAGNOSIS — E119 Type 2 diabetes mellitus without complications: Secondary | ICD-10-CM | POA: Diagnosis not present

## 2018-04-06 DIAGNOSIS — I25119 Atherosclerotic heart disease of native coronary artery with unspecified angina pectoris: Secondary | ICD-10-CM

## 2018-04-06 DIAGNOSIS — Z7901 Long term (current) use of anticoagulants: Secondary | ICD-10-CM | POA: Diagnosis not present

## 2018-04-06 DIAGNOSIS — E7800 Pure hypercholesterolemia, unspecified: Secondary | ICD-10-CM | POA: Insufficient documentation

## 2018-04-06 DIAGNOSIS — I2510 Atherosclerotic heart disease of native coronary artery without angina pectoris: Secondary | ICD-10-CM | POA: Diagnosis present

## 2018-04-06 DIAGNOSIS — Z86718 Personal history of other venous thrombosis and embolism: Secondary | ICD-10-CM | POA: Insufficient documentation

## 2018-04-06 DIAGNOSIS — I25110 Atherosclerotic heart disease of native coronary artery with unstable angina pectoris: Secondary | ICD-10-CM | POA: Insufficient documentation

## 2018-04-06 DIAGNOSIS — E785 Hyperlipidemia, unspecified: Secondary | ICD-10-CM | POA: Insufficient documentation

## 2018-04-06 DIAGNOSIS — Z794 Long term (current) use of insulin: Secondary | ICD-10-CM | POA: Diagnosis not present

## 2018-04-06 DIAGNOSIS — I080 Rheumatic disorders of both mitral and aortic valves: Secondary | ICD-10-CM | POA: Insufficient documentation

## 2018-04-06 DIAGNOSIS — Z79899 Other long term (current) drug therapy: Secondary | ICD-10-CM | POA: Insufficient documentation

## 2018-04-06 DIAGNOSIS — I25710 Atherosclerosis of autologous vein coronary artery bypass graft(s) with unstable angina pectoris: Secondary | ICD-10-CM

## 2018-04-06 DIAGNOSIS — Z951 Presence of aortocoronary bypass graft: Secondary | ICD-10-CM | POA: Insufficient documentation

## 2018-04-06 HISTORY — PX: LEFT HEART CATH AND CORS/GRAFTS ANGIOGRAPHY: CATH118250

## 2018-04-06 LAB — GLUCOSE, CAPILLARY
Glucose-Capillary: 127 mg/dL — ABNORMAL HIGH (ref 70–99)
Glucose-Capillary: 115 mg/dL — ABNORMAL HIGH (ref 70–99)

## 2018-04-06 SURGERY — LEFT HEART CATH AND CORS/GRAFTS ANGIOGRAPHY
Anesthesia: LOCAL

## 2018-04-06 MED ORDER — LIDOCAINE HCL (PF) 1 % IJ SOLN
INTRAMUSCULAR | Status: AC
  Filled 2018-04-06: qty 30

## 2018-04-06 MED ORDER — ONDANSETRON HCL 4 MG/2ML IJ SOLN: 4.0000 mg | Freq: Four times a day (QID) | INTRAMUSCULAR | Status: DC | PRN

## 2018-04-06 MED ORDER — VERAPAMIL HCL 2.5 MG/ML IV SOLN
INTRAVENOUS | Status: AC
  Filled 2018-04-06: qty 2

## 2018-04-06 MED ORDER — SODIUM CHLORIDE 0.9 % IV SOLN: INTRAVENOUS | Status: AC

## 2018-04-06 MED ORDER — LIDOCAINE HCL (PF) 1 % IJ SOLN
INTRAMUSCULAR | Status: DC | PRN
  Administered 2018-04-06: 15 mL via INTRADERMAL

## 2018-04-06 MED ORDER — SODIUM CHLORIDE 0.9 % WEIGHT BASED INFUSION: 1.0000 mL/kg/h | INTRAVENOUS | Status: DC

## 2018-04-06 MED ORDER — HYDRALAZINE HCL 20 MG/ML IJ SOLN
INTRAMUSCULAR | Status: DC | PRN
  Administered 2018-04-06: 10 mg via INTRAVENOUS

## 2018-04-06 MED ORDER — IOHEXOL 350 MG/ML SOLN
INTRAVENOUS | Status: DC | PRN
  Administered 2018-04-06: 70 mL via INTRA_ARTERIAL

## 2018-04-06 MED ORDER — NITROGLYCERIN 1 MG/10 ML FOR IR/CATH LAB
INTRA_ARTERIAL | Status: AC
  Filled 2018-04-06: qty 10

## 2018-04-06 MED ORDER — SODIUM CHLORIDE 0.9 % IV SOLN: 250.0000 mL | INTRAVENOUS | Status: DC | PRN

## 2018-04-06 MED ORDER — ASPIRIN 81 MG PO CHEW: 81.0000 mg | CHEWABLE_TABLET | ORAL | Status: DC

## 2018-04-06 MED ORDER — HEPARIN (PORCINE) IN NACL 1000-0.9 UT/500ML-% IV SOLN
INTRAVENOUS | Status: AC
  Filled 2018-04-06: qty 1000

## 2018-04-06 MED ORDER — ACETAMINOPHEN 325 MG PO TABS: 650.0000 mg | ORAL_TABLET | ORAL | Status: DC | PRN

## 2018-04-06 MED ORDER — HEPARIN (PORCINE) IN NACL 1000-0.9 UT/500ML-% IV SOLN
INTRAVENOUS | Status: DC | PRN
  Administered 2018-04-06 (×2): 500 mL

## 2018-04-06 MED ORDER — SODIUM CHLORIDE 0.9% FLUSH: 3.0000 mL | Freq: Two times a day (BID) | INTRAVENOUS | Status: DC

## 2018-04-06 MED ORDER — HYDRALAZINE HCL 20 MG/ML IJ SOLN
INTRAMUSCULAR | Status: AC
  Filled 2018-04-06: qty 1

## 2018-04-06 MED ORDER — SODIUM CHLORIDE 0.9 % WEIGHT BASED INFUSION
3.0000 mL/kg/h | INTRAVENOUS | Status: AC
  Administered 2018-04-06: 3 mL/kg/h via INTRAVENOUS

## 2018-04-06 MED ORDER — SODIUM CHLORIDE 0.9% FLUSH: 3.0000 mL | INTRAVENOUS | Status: DC | PRN

## 2018-04-06 MED ORDER — ASPIRIN 81 MG PO CHEW: 81.0000 mg | CHEWABLE_TABLET | Freq: Every day | ORAL | Status: DC

## 2018-04-06 SURGICAL SUPPLY — 9 items
CATH INFINITI 5 FR RCB (CATHETERS) ×1 IMPLANT
CATH INFINITI 5FR MULTPACK ANG (CATHETERS) ×1 IMPLANT
CLOSURE MYNX CONTROL 5F (Vascular Products) ×1 IMPLANT
KIT HEART LEFT (KITS) ×2 IMPLANT
PACK CARDIAC CATHETERIZATION (CUSTOM PROCEDURE TRAY) ×2 IMPLANT
SHEATH PINNACLE 5F 10CM (SHEATH) ×1 IMPLANT
TRANSDUCER W/STOPCOCK (MISCELLANEOUS) ×2 IMPLANT
TUBING CIL FLEX 10 FLL-RA (TUBING) ×2 IMPLANT
WIRE EMERALD 3MM-J .035X150CM (WIRE) ×1 IMPLANT

## 2018-04-06 NOTE — Discharge Instructions (Signed)

## 2018-04-06 NOTE — Interval H&P Note (Signed)
Cath Lab Visit (complete for each Cath Lab visit)  Clinical Evaluation Leading to the Procedure:   ACS: No.  Non-ACS:    Anginal Classification: CCS II  Anti-ischemic medical therapy: Minimal Therapy (1 class of medications)  Non-Invasive Test Results: No non-invasive testing performed  Prior CABG: Previous CABG      History and Physical Interval Note:  04/06/2018 12:10 PM  Nathan [name]  has presented today for surgery, with the diagnosis of cad - angina  The various methods of treatment have been discussed with the patient and family. After consideration of risks, benefits and other options for treatment, the patient has consented to  Procedure(s): LEFT HEART CATH AND CORS/GRAFTS ANGIOGRAPHY (N/A) as a surgical intervention .  The patient's history has been reviewed, patient examined, no change in status, stable for surgery.  I have reviewed the patient's chart and labs.  Questions were answered to the patient's satisfaction.     Quay Burow

## 2018-04-07 ENCOUNTER — Telehealth: Payer: Self-pay | Admitting: Cardiovascular Disease

## 2018-04-07 MED FILL — Nitroglycerin IV Soln 100 MCG/ML in D5W: INTRA_ARTERIAL | Qty: 10 | Status: AC

## 2018-04-07 MED FILL — Verapamil HCl IV Soln 2.5 MG/ML: INTRAVENOUS | Qty: 2 | Status: AC

## 2018-04-07 NOTE — Telephone Encounter (Signed)
Returned pt call who states that he thought he would hear about his Cath results by now from Dr. Acie Fredrickson. I made pt aware that the results haven't come in yet. I did remind pt that he does have an appt with Richardson Dopp, PA-C on 3/4 @ 8:15 for his f/u cath appt. Pt thanked me for the call.

## 2018-04-07 NOTE — Telephone Encounter (Signed)
° ° °  Patient calling to discuss results from heart cath

## 2018-04-14 ENCOUNTER — Telehealth: Payer: Self-pay | Admitting: Physician Assistant

## 2018-04-14 DIAGNOSIS — R0600 Dyspnea, unspecified: Secondary | ICD-10-CM | POA: Diagnosis not present

## 2018-04-14 DIAGNOSIS — N183 Chronic kidney disease, stage 3 (moderate): Secondary | ICD-10-CM | POA: Diagnosis not present

## 2018-04-14 DIAGNOSIS — I130 Hypertensive heart and chronic kidney disease with heart failure and stage 1 through stage 4 chronic kidney disease, or unspecified chronic kidney disease: Secondary | ICD-10-CM | POA: Diagnosis not present

## 2018-04-14 DIAGNOSIS — I509 Heart failure, unspecified: Secondary | ICD-10-CM | POA: Diagnosis not present

## 2018-04-14 DIAGNOSIS — Z6828 Body mass index (BMI) 28.0-28.9, adult: Secondary | ICD-10-CM | POA: Diagnosis not present

## 2018-04-14 DIAGNOSIS — I351 Nonrheumatic aortic (valve) insufficiency: Secondary | ICD-10-CM | POA: Diagnosis not present

## 2018-04-14 NOTE — Telephone Encounter (Signed)
I spoke to the patient who said that he had a heart cath last Thursday and that his groin hurt this morning while reaching down to pick something up.  He feels a burning/pain and there is a slight residual bruise at the site, but no swelling.  He has an appt with his PCP today and a f/u next Tuesday with [place].  I told him that if the pain worsens or swelling occurs, he should go to the ED.  He verbalized understanding and was thankful for the call.

## 2018-04-14 NOTE — Telephone Encounter (Signed)
Have him hold his Eliquis for 2 days.  He may have bleed slightly in his cath site.   Have him restart Eliquis Monday AM

## 2018-04-14 NOTE — Telephone Encounter (Signed)
I spoke to the patient with Dr Elmarie Shiley recommendation to hold Eliquis and resume Monday morning.  He verbalized understanding.

## 2018-04-14 NOTE — Telephone Encounter (Signed)
New Message:    Pt said he had a Cath last Thursday> He said, it is hurting and stinging in his groin area.

## 2018-04-18 NOTE — Progress Notes (Signed)
Cardiology Office Note:    Date:  04/19/2018   ID:  Nathan [name], DOB 1933-09-05, MRN [id number]  PCP:  Nathan Battles, MD  Cardiologist:  Nathan Moores, MD   Electrophysiologist:  None   Referring MD: Nathan Battles, MD   Chief Complaint  Patient presents with  . Follow-up    s/p recent cardiac catheterization     History of Present Illness:    Nathan [name] is a 85 y.o. male with coronary artery disease s/p coronary artery bypass grafting, aortic insufficiency, mitral regurgitation, L leg DVT (on Apixaban), diabetes, hyperlipidemia, CKD.  Echo in 8/19 demonstrated normal EF, mod AI and mild MR.  He was recently evaluated for chest discomfort on 04/04/2018.  We elected to undertake cardiac catheterization to further evaluate his symptoms.  This demonstrated patent S-RPDA/RPLB2, patent L-LAD and occluded S-D1 and S-OM2.  Med Rx was recommended.    Nathan [name] returns for follow-up.  He notes significant issues with shortness of breath after his cardiac catheterization.  He has also had significant issues with what sounds like orthopnea and paroxysmal nocturnal dyspnea.  His primary care provider placed him on furosemide 20 mg daily.  Over the past several days, he has felt somewhat better.  He does note less exertional chest discomfort and shortness of breath.  His lower extremity swelling is chronic but may be somewhat worse.  He does note weight gain.  He has not had any bleeding issues.  He did have some right groin pain (cardiac catheterization site) after bending over several days ago.  He contacted our office and his Apixaban was held for a couple of days.  His right groin site now is feeling much better.  Prior CV studies:   The following studies were reviewed today:  Cardiac catheterization 04/06/2018 Lom ost 99 LAD ost 100 LCx ost 99, prox 100; dist LCx fills from collats from RPLB1 RCA prox 99, mid 100 S-RPDA/RPLB2 patent S-OM2 100 S-D1 100 L-LAD patent   Echo  09/29/17 Mild LVH, mild focal basal septal hypertrophy, EF 60-65, no RWMA, mod AI, mild MR, mod LAE, mild RVE, mod TR, PASP 48  Myoview 01/23/15  There was no ST segment deviation noted during stress.  The study is normal.  This is a low risk study.  The left ventricular ejection fraction is normal (55-65%). There is attenuation in basal and mid inferolateral and anterolateral stress and rest images and no evidence of ischemia. Overall LVEF 65%.   Echo 01/22/15 Mod focal basal septal hypertrophy, EF 55-60, no RWMA, Gr 1 DD, mod AI, Ao root 41 mm, mild MR, mild LAE, PASP 37  Myoview 10/06/11 Overall Impression: Normal stress nuclear study.No perfusion defects though there was 1 mm ST depression in 2 leads on Lexiscan ECG. Suspect this was a false positive finding given the perfusion images.  LV Ejection Fraction: Not gated. LV Wall Motion: Not gated  Cardiac Catheterization 12/19/2000 ANGIOGRAPHY: The left main coronary artery has a 60% stenosis in the distal segment. The proximal left main is normal. The left anterior descending artery has a proximal irregular 60% stenosis. The stenosis is fairly long and encompasses the entire proximal LAD. There is a 70-80% stenosis in the mid LAD just after giving off the first diagonal branch. The distal left anterior descending is fairly unremarkable. The first diagonal vessel is a fairly large vessel and only has minor luminal irregularities. The left circumflex artery is a moderate sized vessel. There is a proximal 80-90% stenosis. The circumflex then  gives off a moderate sized obtuse marginal artery, which has a 90-95% stenosis proximally. There is a stenosis in the circumflex artery just after giving of the first obtuse marginal artery of about 80%. The right coronary artery is large and dominant. There is an 80% mid stenosis. The posterior descending artery is unremarkable. The posterolateral segment artery has a 50-60% stenosis  at its mid point. The left subclavian artery is unremarkable. The left internal mammary artery is normal. The left vertebral artery has a 30-40% stenosis in the proximal segment. LEFT VENTRICULOGRAM: The left ventriculogram was performed in a 30 RAO position. It reveals no segmental wall motion abnormalities. There is a trace amount of catheter-induced mitral regurgitation. COMPLICATIONS: None. CONCLUSIONS: Three-vessel coronary artery disease. The fact that the patient had a positive treadmill, but a negative Cardiolite scan suggest that he had a matched defect. The catheterization confirms this theory. He will probably need coronary artery bypass grafting. He has well preserved left ventricular systolic function. Dictated by: Nathan [name]., M.D  Past Medical History:  Diagnosis Date  . Acid reflux   . Aortic insufficiency   . Cholecystitis   . Coronary artery disease 2002   CABG x5  . Diabetes mellitus   . DVT (deep venous thrombosis) (HCC)    previously on coumadin  . Hypercholesteremia   . Hyperlipidemia   . Mitral valve regurgitation   . Skin cancer    tumor removed off of his back   Surgical Hx: The patient  has a past surgical history that includes Coronary artery bypass graft (12/21/2000); Tumor removal; Skin cancer excision; Biliary drainage catheter placement w/ bile duct tube change (01/2015); and LEFT HEART CATH AND CORS/GRAFTS ANGIOGRAPHY (N/A, 04/06/2018).   Current Medications: Current Meds  Medication Sig  . atenolol (TENORMIN) 25 MG tablet Take 1 tablet (25 mg total) by mouth 2 (two) times daily.  [name] atorvastatin (LIPITOR) 40 MG tablet Take 40 mg by mouth daily.  . Blood Glucose Monitoring Suppl (ONETOUCH VERIO) w/Device KIT   . ELIQUIS 5 MG TABS tablet Take 5 mg by mouth 2 (two) times daily.  . insulin NPH-regular Human (NOVOLIN 70/30) (70-30) 100 UNIT/ML injection Inject 40-45 Units into the skin See admin instructions. 45 units in the morning, 40  units at dinner  . Lancets (ONETOUCH ULTRASOFT) lancets   . Multiple Vitamins-Minerals (CENTRUM SILVER PO) Take 1 tablet by mouth daily.  . nitroGLYCERIN (NITROSTAT) 0.4 MG SL tablet Place 1 tablet (0.4 mg total) under the tongue every 5 (five) minutes as needed for chest pain.  . Omega-3 Fatty Acids (FISH OIL) 1200 MG CAPS Take 1,200 mg by mouth 2 (two) times daily.   [name] omeprazole (PRILOSEC) 20 MG capsule Take 20 mg by mouth 2 (two) times daily.   Glory Rosebush VERIO test strip   . potassium chloride (K-DUR,KLOR-CON) 10 MEQ tablet Take 10 mEq by mouth daily.  [name] RELION INSULIN SYRINGE 1ML/31G 31G X 5/16" 1 ML MISC USE 1 TWICE DAILY AS DIRECTED  . [DISCONTINUED] furosemide (LASIX) 20 MG tablet Take 20 mg by mouth daily.  . [DISCONTINUED] isosorbide mononitrate (IMDUR) 30 MG 24 hr tablet Take 1 tablet (30 mg total) by mouth daily.     Allergies:   Patient has no known allergies.   Social History   Tobacco Use  . Smoking status: Never Smoker  . Smokeless tobacco: Never Used  Substance Use Topics  . Alcohol use: No  . Drug use: No     Family  Hx: The patient's family history includes Cancer in his brother and sister; Diabetes in his brother; Heart attack in his father; Hypertension in his mother.  ROS:   Please see the history of present illness.    ROS All other systems reviewed and are negative.   EKGs/Labs/Other Test Reviewed:    EKG:  EKG is  ordered today.  The ekg ordered today demonstrates normal sinus rhythm, heart rate 76, left axis deviation, right bundle branch block, T wave inversions 1 and aVL, QTC 515, similar to prior tracing  Recent Labs: 04/04/2018: BUN 21; Creatinine, Ser 1.65; Hemoglobin 13.6; Platelets 189; Potassium 3.8; Sodium 143   Recent Lipid Panel Lab Results  Component Value Date/Time   CHOL 143 11/10/2016 08:29 AM   TRIG 128 11/10/2016 08:29 AM   HDL 36 (L) 11/10/2016 08:29 AM   CHOLHDL 4.0 11/10/2016 08:29 AM   CHOLHDL 4.9 10/02/2015 07:33 AM    LDLCALC 81 11/10/2016 08:29 AM   LDLDIRECT 77.1 01/16/2013 04:07 PM    Physical Exam:    VS:  BP 130/90   Pulse 76   Ht 6' (1.829 m)   Wt 203 lb 6.4 oz (92.3 kg)   SpO2 95%   BMI 27.59 kg/m     Wt Readings from Last 3 Encounters:  04/19/18 203 lb 6.4 oz (92.3 kg)  04/06/18 201 lb (91.2 kg)  04/04/18 201 lb 12.8 oz (91.5 kg)     Physical Exam  Constitutional: He is oriented to person, place, and time. He appears well-developed and well-nourished. No distress.  HENT:  Head: Normocephalic and atraumatic.  Eyes: No scleral icterus.  Neck: JVD (JVP ~ 6 cm) present. No thyromegaly present.  Cardiovascular: Normal rate and regular rhythm.  No murmur heard. Pulmonary/Chest: Effort normal. He has no rales.  Abdominal: He exhibits distension.  Musculoskeletal:        General: Edema (1-2+ bilat LE edema) present.  Lymphadenopathy:    He has no cervical adenopathy.  Neurological: He is alert and oriented to person, place, and time.  Skin: Skin is warm and dry.  Psychiatric: He has a normal mood and affect.    ASSESSMENT & PLAN:    Acute diastolic CHF (congestive heart failure) (HCC)  EF by echocardiogram in August 2019 was 60-65%.  He describes symptoms of congestive heart failure that seemed to have worsened after his cardiac catheterization.  I suspect he may have gotten volume overloaded from fluid hydration around his cardiac catheterization.  He has had some improvement with Lasix that was initiated by primary care.  His weight is up some and his legs are more swollen.  I think he would feel better on a higher dose of furosemide.  He did not have a left ventriculogram at the time of his cardiac catheterization due to his chronic kidney disease.  He also has a history of moderate aortic insufficiency as well as pulmonary hypertension on echocardiogram in 8/19.  Therefore, I have recommended repeating his echocardiogram to reassess his LV function, pulmonary pressures and his aortic  valve.  -Increase furosemide to 40 mg daily  -Plan: Basic metabolic panel, Pro b natriuretic peptide today,   -Basic metabolic panel in 1 week,   -Schedule ECHOCARDIOGRAM COMPLETE  Coronary artery disease involving native coronary artery of native heart with angina pectoris ([place]) History of CABG in 2002.  Recent cardiac catheterization demonstrated patent LIMA-LAD and patent SVG-RPDA/RPLB2.  His vein graft to the OM2 is occluded as well as the vein graft to  the first diagonal.  He has collaterals filling the distal circumflex from RPLB1.  Medical therapy has been recommended.  He has had some improvement in his exertional angina with isosorbide.  He is having some dizziness and headache from the isosorbide.  However, this seems to be better.  I have asked him to try to increase isosorbide to 60 mg daily in several days.  If he is still experiencing dizziness, he can remain on 30 mg daily.  Continue current dose of atenolol.  Consider changing to metoprolol succinate in the future.  He is not on aspirin as he is on Apixaban.  Continue atorvastatin.  Aortic valve insufficiency, etiology of cardiac valve disease unspecified  Moderate by echo in August 2019.  Given his volume overload, I will repeat his echocardiogram.  CKD (chronic kidney disease) stage 3, GFR 30-59 ml/min (HCC)  Repeat BMET today.  If his creatinine remains >1.5, his Apixaban will need to be decreased to 2.5 mg twice daily.  Essential hypertension Blood pressure somewhat above target.  Adjust furosemide and isosorbide as noted.   Dispo:  Return in about 2 weeks (around 05/03/2018) for Close Follow Up, w/ Dr. Acie Fredrickson, or Richardson Dopp, PA-C.   Medication Adjustments/Labs and Tests Ordered: Current medicines are reviewed at length with the patient today.  Concerns regarding medicines are outlined above.  Tests Ordered: Orders Placed This Encounter  Procedures  . Basic metabolic panel  . Pro b natriuretic peptide  . Basic  metabolic panel  . EKG 12-Lead  . ECHOCARDIOGRAM COMPLETE   Medication Changes: Meds ordered this encounter  Medications  . isosorbide mononitrate (IMDUR) 60 MG 24 hr tablet    Sig: Take 1 tablet (60 mg total) by mouth daily.    Dispense:  90 tablet    Refill:  3    Dose increase    Order Specific Question:   Supervising Provider    Answer:   Jettie Booze [[id number]]  . furosemide (LASIX) 40 MG tablet    Sig: Take 1 tablet (40 mg total) by mouth daily.    Dispense:  90 tablet    Refill:  3    Dose Increase    Order Specific Question:   Supervising Provider    Answer:   Jettie Booze [[id number]]    Signed, Richardson Dopp, PA-C  04/19/2018 2:34 PM    [facility] [place], [place], [place]  [address] Phone: [phone]; Fax: [phone]

## 2018-04-19 ENCOUNTER — Encounter: Payer: Self-pay | Admitting: Physician Assistant

## 2018-04-19 ENCOUNTER — Ambulatory Visit: Payer: PPO | Admitting: Physician Assistant

## 2018-04-19 VITALS — BP 130/90 | HR 76 | Ht 72.0 in | Wt 203.4 lb

## 2018-04-19 DIAGNOSIS — I25119 Atherosclerotic heart disease of native coronary artery with unspecified angina pectoris: Secondary | ICD-10-CM

## 2018-04-19 DIAGNOSIS — N183 Chronic kidney disease, stage 3 unspecified: Secondary | ICD-10-CM

## 2018-04-19 DIAGNOSIS — I10 Essential (primary) hypertension: Secondary | ICD-10-CM

## 2018-04-19 DIAGNOSIS — I5031 Acute diastolic (congestive) heart failure: Secondary | ICD-10-CM

## 2018-04-19 DIAGNOSIS — I351 Nonrheumatic aortic (valve) insufficiency: Secondary | ICD-10-CM

## 2018-04-19 LAB — BASIC METABOLIC PANEL
BUN/Creatinine Ratio: 11 (ref 10–24)
BUN: 20 mg/dL (ref 8–27)
CO2: 19 mmol/L — ABNORMAL LOW (ref 20–29)
Calcium: 8.4 mg/dL — ABNORMAL LOW (ref 8.6–10.2)
Chloride: 107 mmol/L — ABNORMAL HIGH (ref 96–106)
Creatinine, Ser: 1.86 mg/dL — ABNORMAL HIGH (ref 0.76–1.27)
GFR calc Af Amer: 38 mL/min/{1.73_m2} — ABNORMAL LOW (ref >59)
GFR calc non Af Amer: 33 mL/min/{1.73_m2} — ABNORMAL LOW (ref >59)
Glucose: 102 mg/dL — ABNORMAL HIGH (ref 65–99)
Potassium: 4 mmol/L (ref 3.5–5.2)
Sodium: 141 mmol/L (ref 134–144)

## 2018-04-19 LAB — PRO B NATRIURETIC PEPTIDE: NT-Pro BNP: 5139 pg/mL — ABNORMAL HIGH (ref 0–486)

## 2018-04-19 MED ORDER — ISOSORBIDE MONONITRATE ER 60 MG PO TB24: 60.0000 mg | ORAL_TABLET | Freq: Every day | ORAL | 3 refills | Status: DC

## 2018-04-19 MED ORDER — FUROSEMIDE 40 MG PO TABS: 40.0000 mg | ORAL_TABLET | Freq: Every day | ORAL | 3 refills | Status: DC

## 2018-04-19 NOTE — Patient Instructions (Signed)
Medication Instructions:   Increase your Furosemide to 40 mg once daily.  You can take 2 of the 20 mg tablets to equal 40 mg.  I have sent a new prescription to your pharmacy.   Increase your Isosorbide (Imdur) to 60 mg once daily.  You can take 2 of the 30 mg tablets to equal 60 mg.  I have sent a new prescription to your pharmacy.  If you need a refill on your cardiac medications before your next appointment, please call your pharmacy.   Lab work:  Today - BMET, BNP   1 week - BMET  If you have labs (blood work) drawn today and your tests are completely normal, you will receive your results only by: [name] MyChart Message (if you have MyChart) OR . A paper copy in the mail If you have any lab test that is abnormal or we need to change your treatment, we will call you to review the results.  Testing/Procedures:  Schedule an Echocardiogram. Your physician has requested that you have an echocardiogram. Echocardiography is a painless test that uses sound waves to create images of your heart. It provides your doctor with information about the size and shape of your heart and how well your heart's chambers and valves are working. This procedure takes approximately one hour. There are no restrictions for this procedure.    Follow-Up: At [facility], you and your health needs are our priority.  As part of our continuing mission to provide you with exceptional heart care, we have created designated Provider Care Teams.  These Care Teams include your primary Cardiologist (physician) and Advanced Practice Providers (APPs -  Physician Assistants and Nurse Practitioners) who all work together to provide you with the care you need, when you need it. You will need a follow up appointment in:  2 weeks.  Please call our office 2 months in advance to schedule this appointment.  You may see [name], MD or Richardson Dopp, PA-C   Any Other Special Instructions Will Be Listed Below (If  Applicable). Weigh daily.  Call if your weight increases by 3 lbs or more in 1 day. Limit your salt in your diet to 2000 mg or less per day.

## 2018-04-20 ENCOUNTER — Telehealth: Payer: Self-pay | Admitting: *Deleted

## 2018-04-20 DIAGNOSIS — I38 Endocarditis, valve unspecified: Secondary | ICD-10-CM

## 2018-04-20 DIAGNOSIS — L821 Other seborrheic keratosis: Secondary | ICD-10-CM | POA: Diagnosis not present

## 2018-04-20 DIAGNOSIS — L814 Other melanin hyperpigmentation: Secondary | ICD-10-CM | POA: Diagnosis not present

## 2018-04-20 DIAGNOSIS — L820 Inflamed seborrheic keratosis: Secondary | ICD-10-CM | POA: Diagnosis not present

## 2018-04-20 DIAGNOSIS — D229 Melanocytic nevi, unspecified: Secondary | ICD-10-CM | POA: Diagnosis not present

## 2018-04-20 DIAGNOSIS — I5031 Acute diastolic (congestive) heart failure: Secondary | ICD-10-CM

## 2018-04-20 DIAGNOSIS — L570 Actinic keratosis: Secondary | ICD-10-CM | POA: Diagnosis not present

## 2018-04-20 DIAGNOSIS — C44629 Squamous cell carcinoma of skin of left upper limb, including shoulder: Secondary | ICD-10-CM | POA: Diagnosis not present

## 2018-04-20 MED ORDER — FUROSEMIDE 40 MG PO TABS: 60.0000 mg | ORAL_TABLET | Freq: Every day | ORAL | 3 refills | Status: DC

## 2018-04-20 NOTE — Telephone Encounter (Signed)
SPOKE WITH PT WHO WANTED TO RECLARIFY MEDICATION INSTRUCTIONS.  PT WAS TOLD IF STILL TAKING  LASIX 20 MG TABLETS UNTIL TO PICKING UP NEW SCRIPT LASIX 40 MG, TO MAKE SURE HE TAKING CORRECT DOSAGE.  LASIX 20 MG ( TAKE 2 TWICE A DAY ) TODAY AND TOMORROW  LASIX 20 MG ( TAKE 3 DAILY) STARTING SATURDAY

## 2018-04-20 NOTE — Addendum Note (Signed)
Addended by: Claude Manges on: 04/20/2018 12:47 PM   Modules accepted: Orders

## 2018-04-20 NOTE — Telephone Encounter (Signed)
  Nathan [name] would like a call back to clarify some instructions from previous call

## 2018-04-20 NOTE — Telephone Encounter (Signed)
UNABLE TO REACH PT  CALLED AND SPOKE WITH PT DTR ON DPR WITH LAB RESULTS AND MEDICATION CHANGES FOR LASIX AND POTASSIUM FOR TODAY AND TOMORROW THEN RESUME NORMAL DOSAGE  SAT.

## 2018-04-20 NOTE — Telephone Encounter (Signed)
-----   Message from Liliane Shi, PA-C sent at 04/20/2018  9:23 AM EST ----- BNP is very high.  Creatinine is fairly stable.  K is normal.  The calcium is low. He is showing signs of volume excess and I want him to take more Lasix than what was recommended at the [facility] yesterday.  Recommendations:  - Take Lasix 40 mg twice daily today and tomorrow  - Then on Sat 3.7.2020, reduce Lasix to 60 mg once daily   - Take extra K+ 10 mEq today and tomorrow, then resume usual dose.  - Repeat BMET should be pending in 1 week.  Add BNP to FU labs next week.   - FU with PCP for low calcium.  Send labs to PCP.   Richardson Dopp, PA-C    04/20/2018 9:18 AM

## 2018-04-20 NOTE — Telephone Encounter (Signed)
PT CALLED BACK AND VERBALIZED UNDERSTANDING OF RESULTS AND MEDICATION INSTRUCTIONS  PT LASIX DOSE WAS UPDATED  TO 60 MG DAILY NO REFILLS  SENT IN.

## 2018-04-20 NOTE — Progress Notes (Signed)
Reviewed patient's dose for Eliquis. Lab Results  Component Value Date   CREATININE 1.86 (H) 04/19/2018    I reviewed current recommendations on UpToDate.   There is no dose adjustment for renal impairment for DVT.  Trials did not include patients with Creatinine > 2.5.  His Creatinine has been lower than 2.5.  The following was copied from UpToDate:    PLAN: Therefore, his Eliquis dose should remain the same - 5 mg twice daily.  Richardson Dopp, PA-C    04/20/2018 9:41 AM

## 2018-04-26 ENCOUNTER — Ambulatory Visit (HOSPITAL_COMMUNITY): Payer: PPO | Attending: Cardiovascular Disease

## 2018-04-26 ENCOUNTER — Other Ambulatory Visit: Payer: PPO | Admitting: *Deleted

## 2018-04-26 ENCOUNTER — Other Ambulatory Visit: Payer: Self-pay

## 2018-04-26 ENCOUNTER — Encounter: Payer: Self-pay | Admitting: Physician Assistant

## 2018-04-26 DIAGNOSIS — I5031 Acute diastolic (congestive) heart failure: Secondary | ICD-10-CM | POA: Insufficient documentation

## 2018-04-26 DIAGNOSIS — I25119 Atherosclerotic heart disease of native coronary artery with unspecified angina pectoris: Secondary | ICD-10-CM | POA: Insufficient documentation

## 2018-04-26 DIAGNOSIS — I351 Nonrheumatic aortic (valve) insufficiency: Secondary | ICD-10-CM

## 2018-04-26 DIAGNOSIS — N183 Chronic kidney disease, stage 3 unspecified: Secondary | ICD-10-CM

## 2018-04-26 LAB — BASIC METABOLIC PANEL
BUN/Creatinine Ratio: 13 (ref 10–24)
BUN: 27 mg/dL (ref 8–27)
CALCIUM: 8.5 mg/dL — AB (ref 8.6–10.2)
CO2: 21 mmol/L (ref 20–29)
Chloride: 103 mmol/L (ref 96–106)
Creatinine, Ser: 2.05 mg/dL — ABNORMAL HIGH (ref 0.76–1.27)
GFR calc Af Amer: 34 mL/min/{1.73_m2} — ABNORMAL LOW (ref >59)
GFR calc non Af Amer: 29 mL/min/{1.73_m2} — ABNORMAL LOW (ref >59)
Glucose: 167 mg/dL — ABNORMAL HIGH (ref 65–99)
Potassium: 4.4 mmol/L (ref 3.5–5.2)
Sodium: 140 mmol/L (ref 134–144)

## 2018-04-26 LAB — PRO B NATRIURETIC PEPTIDE: NT-Pro BNP: 2056 pg/mL — ABNORMAL HIGH (ref 0–486)

## 2018-04-27 ENCOUNTER — Telehealth: Payer: Self-pay | Admitting: *Deleted

## 2018-04-27 ENCOUNTER — Other Ambulatory Visit: Payer: Self-pay | Admitting: *Deleted

## 2018-04-27 DIAGNOSIS — I5031 Acute diastolic (congestive) heart failure: Secondary | ICD-10-CM

## 2018-04-27 MED ORDER — CARVEDILOL 6.25 MG PO TABS: 6.2500 mg | ORAL_TABLET | Freq: Two times a day (BID) | ORAL | 3 refills | Status: DC

## 2018-04-27 NOTE — Telephone Encounter (Signed)
SPOKE WITH  PT AND PT AWARE OF RESULTS AND MEDICATION CHANGES. PT VERBALIZED UNDERSTANDING.

## 2018-04-27 NOTE — Telephone Encounter (Signed)
-----   Message from Liliane Shi, [place] sent at 04/26/2018 10:18 PM EDT ----- Please call the patient. His heart function (ejection fraction) is worse.  It was normal (60-65%).  It is now 40-45.  The leakage of the aortic valve is unchanged.  The heart is mildly stiff (diastolic dysfunction).   Recommendations:  - DC Atenolol  - Start Coreg 6.25 mg twice daily   - Continue other medications the same and follow up as planned.   Richardson Dopp, PA-C    04/26/2018 9:55 PM

## 2018-05-05 ENCOUNTER — Ambulatory Visit: Payer: PPO | Admitting: Podiatry

## 2018-05-05 ENCOUNTER — Other Ambulatory Visit: Payer: Self-pay

## 2018-05-05 ENCOUNTER — Encounter: Payer: Self-pay | Admitting: Podiatry

## 2018-05-05 DIAGNOSIS — M79676 Pain in unspecified toe(s): Secondary | ICD-10-CM | POA: Diagnosis not present

## 2018-05-05 DIAGNOSIS — Z794 Long term (current) use of insulin: Secondary | ICD-10-CM | POA: Diagnosis not present

## 2018-05-05 DIAGNOSIS — D689 Coagulation defect, unspecified: Secondary | ICD-10-CM

## 2018-05-05 DIAGNOSIS — Z6829 Body mass index (BMI) 29.0-29.9, adult: Secondary | ICD-10-CM | POA: Diagnosis not present

## 2018-05-05 DIAGNOSIS — I82402 Acute embolism and thrombosis of unspecified deep veins of left lower extremity: Secondary | ICD-10-CM | POA: Diagnosis not present

## 2018-05-05 DIAGNOSIS — B351 Tinea unguium: Secondary | ICD-10-CM

## 2018-05-05 DIAGNOSIS — E1142 Type 2 diabetes mellitus with diabetic polyneuropathy: Secondary | ICD-10-CM

## 2018-05-05 DIAGNOSIS — E1129 Type 2 diabetes mellitus with other diabetic kidney complication: Secondary | ICD-10-CM | POA: Diagnosis not present

## 2018-05-05 DIAGNOSIS — I130 Hypertensive heart and chronic kidney disease with heart failure and stage 1 through stage 4 chronic kidney disease, or unspecified chronic kidney disease: Secondary | ICD-10-CM | POA: Diagnosis not present

## 2018-05-05 DIAGNOSIS — I25810 Atherosclerosis of coronary artery bypass graft(s) without angina pectoris: Secondary | ICD-10-CM | POA: Diagnosis not present

## 2018-05-05 DIAGNOSIS — R0609 Other forms of dyspnea: Secondary | ICD-10-CM | POA: Diagnosis not present

## 2018-05-05 DIAGNOSIS — N183 Chronic kidney disease, stage 3 (moderate): Secondary | ICD-10-CM | POA: Diagnosis not present

## 2018-05-05 DIAGNOSIS — I351 Nonrheumatic aortic (valve) insufficiency: Secondary | ICD-10-CM | POA: Diagnosis not present

## 2018-05-05 DIAGNOSIS — Z1331 Encounter for screening for depression: Secondary | ICD-10-CM | POA: Diagnosis not present

## 2018-05-05 NOTE — Progress Notes (Signed)
Patient ID: Nathan [name], male   DOB: December 18, 1933, 85 y.o.   MRN: [id number] Complaint:  Visit Type: Patient returns to my office for continued preventative foot care services. Complaint: Patient states" my nails have grown long and thick and become painful to walk and wear shoes" Patient has been diagnosed with DM with no foot complications. The patient presents for preventative foot care services. No changes to ROS.  Patient is on eliquiss.  Podiatric Exam: Vascular: dorsalis pedis and posterior tibial pulses are palpable bilateral. Capillary return is immediate. Temperature gradient is WNL. Skin turgor WNL  Sensorium: Diminished  Semmes Weinstein monofilament test. Normal tactile sensation bilaterally. Nail Exam: Pt has thick disfigured discolored nails with subungual debris noted bilateral entire nail hallux through fifth toenails Ulcer Exam: There is no evidence of ulcer or pre-ulcerative changes or infection. Orthopedic Exam: Muscle tone and strength are WNL. No limitations in general ROM. No crepitus or effusions noted. Foot type and digits show no abnormalities. Bony prominences are unremarkable.Hammer toes second B/l.  MCJ DJD  B/L. Skin: No Porokeratosis. No infection or ulcers.   Diagnosis:  Onychomycosis, , Pain in right toe, pain in left toes  Treatment & Plan Procedures and Treatment: Consent by patient was obtained for treatment procedures. The patient understood the discussion of treatment and procedures well. All questions were answered thoroughly reviewed. Debridement of mycotic and hypertrophic toenails, 1 through 5 bilateral and clearing of subungual debris. No ulceration, no infection noted.   Return Visit-Office Procedure: Patient instructed to return to the office for a follow up visit 3 months for continued evaluation and treatment.   Gardiner Barefoot DPM

## 2018-05-10 ENCOUNTER — Telehealth: Payer: Self-pay | Admitting: *Deleted

## 2018-05-10 NOTE — Telephone Encounter (Signed)
      A YES to any of these questions would result in the appointment being kept. If all the answers to these questions are NO, we should indicate that given the current situation regarding the worldwide coronarvirus pandemic, at the recommendation of the CDC, we are looking to limit gatherings in our waiting area, and thus will reschedule their appointment beyond four weeks from today.   _____________   LDKCC-61 Pre-Screening Questions:  . Do you currently have a fever? NO  (yes = cancel and refer to pcp for e-visit) . Have you recently travelled on a cruise, internationally, or to [place], [place], [place], [place], [place], or [place], [place] [employer]) ?  NO   (yes = cancel, stay home, monitor symptoms, and contact pcp or initiate e-visit if symptoms develop) . Have you been in contact with someone that is currently pending confirmation of Covid19 testing or has been confirmed to have the [place] virus?  NO  (yes = cancel, stay home, away from tested individual, monitor symptoms, and contact pcp or initiate e-visit if symptoms develop) . Are you currently experiencing fatigue or cough?  NO  (yes = pt should be prepared to have a mask placed at the time of their visit).

## 2018-05-11 DIAGNOSIS — I5042 Chronic combined systolic (congestive) and diastolic (congestive) heart failure: Secondary | ICD-10-CM | POA: Insufficient documentation

## 2018-05-11 DIAGNOSIS — N184 Chronic kidney disease, stage 4 (severe): Secondary | ICD-10-CM | POA: Insufficient documentation

## 2018-05-11 DIAGNOSIS — N183 Chronic kidney disease, stage 3 (moderate): Secondary | ICD-10-CM

## 2018-05-11 NOTE — Progress Notes (Signed)
Cardiology Office Note:    Date:  05/12/2018   ID:  [name], DOB January 11, 1934, MRN [id number]  PCP:  Leanna Battles, MD  Cardiologist:  [name], MD   Electrophysiologist:  None   Referring MD: Leanna Battles, MD   Chief Complaint  Patient presents with  . Follow-up    CHF     History of Present Illness:    Nathan [name] is a 85 y.o. male with coronary artery diseases/pcoronary artery bypass grafting, aortic insufficiency, mitral regurgitation, L leg DVT (on Apixaban),diabetes,hyperlipidemia, CKD. Echo in 8/19 demonstrated normal EF, mod AI and mild MR.  A Cardiac Catheterization in 03/2018 demonstrated patent S-RPDA/RPLB2, patent L-LAD and occluded S-D1 and S-OM2.  Med Rx was recommended.    [name] returns for follow up.  He is here alone.  He still gets short of breath with more extreme activities.  He will also get some lower chest burning with this.  He feels his symptoms are slightly improved.  He is sleeping better.  He has not had paroxysmal nocturnal dyspnea, orthopnea.  His leg swelling is better.  He has not had syncope.    Prior CV studies:   The following studies were reviewed today:  Echo 04/26/2018 Mod HK, inf-lat HK, EF 40-45, impaired relaxation (Gr 1 DD), severe basal septal LVH, mild conc LVH, severe LAE, mild MV calcification, mild to mod TR, mod AoV calcification, mild AI, asc Ao mild dilation (39 mm)  Cardiac catheterization 04/06/2018 Lom ost 99 LAD ost 100 LCx ost 99, prox 100; dist LCx fills from collats from RPLB1 RCA prox 99, mid 100 S-RPDA/RPLB2 patent S-OM2 100 S-D1 100 L-LAD patent   Echo 09/29/17 Mild LVH, mild focal basal septal hypertrophy, EF 60-65, no RWMA, mod AI, mild MR, mod LAE, mild RVE, mod TR, PASP 48  Myoview 01/23/15  There was no ST segment deviation noted during stress.  The study is normal.  This is a low risk study.  The left ventricular ejection fraction is normal (55-65%). There is attenuation in  basal and mid inferolateral and anterolateral stress and rest images and no evidence of ischemia. Overall LVEF 65%.  Echo 01/22/15 Mod focal basal septal hypertrophy, EF 55-60, no RWMA, Gr 1 DD, mod AI, Ao root 41 mm, mild MR, mild LAE, PASP 37  Myoview 10/06/11 Overall Impression: Normal stress nuclear study.No perfusion defects though there was 1 mm ST depression in 2 leads on Lexiscan ECG. Suspect this was a false positive finding given the perfusion images.  LV Ejection Fraction: Not gated. LV Wall Motion: Not gated  Cardiac Catheterization11/05/2000 ANGIOGRAPHY: The left main coronary artery has a 60% stenosis in the distal segment. The proximal left main is normal. The left anterior descending artery has a proximal irregular 60% stenosis. The stenosis is fairly long and encompasses the entire proximal LAD. There is a 70-80% stenosis in the mid LAD just after giving off the first diagonal branch. The distal left anterior descending is fairly unremarkable. The first diagonal vessel is a fairly large vessel and only has minor luminal irregularities. The left circumflex artery is a moderate sized vessel. There is a proximal 80-90% stenosis. The circumflex then gives off a moderate sized obtuse marginal artery, which has a 90-95% stenosis proximally. There is a stenosis in the circumflex artery just after giving of the first obtuse marginal artery of about 80%. The right coronary artery is large and dominant. There is an 80% mid stenosis. The posterior descending artery is unremarkable. The  posterolateral segment artery has a 50-60% stenosis at its mid point. The left subclavian artery is unremarkable. The left internal mammary artery is normal. The left vertebral artery has a 30-40% stenosis in the proximal segment. LEFT VENTRICULOGRAM: The left ventriculogram was performed in a 30 RAO position. It reveals no segmental wall motion abnormalities. There is a trace amount of  catheter-induced mitral regurgitation. COMPLICATIONS: None. CONCLUSIONS: Three-vessel coronary artery disease. The fact that the patient had a positive treadmill, but a negative Cardiolite scan suggest that he had a matched defect. The catheterization confirms this theory. He will probably need coronary artery bypass grafting. He has well preserved left ventricular systolic function. Dictated by: Ramond Dial., M.D  Past Medical History:  Diagnosis Date  . Acid reflux   . Aortic insufficiency   . Cholecystitis   . Combined systolic and diastolic heart failure ([place])    Echo 04/2018: inf-lat HK, EF 40-45, severe basal septal hypertrophy, mild conc LVH, Gr 1 DD, severe LAE, mild to mod TR, mod AI, asc Aorta 39 mm  . Coronary artery disease 2002   CABG x5  . Diabetes mellitus   . DVT (deep venous thrombosis) (HCC)    previously on coumadin  . Hypercholesteremia   . Hyperlipidemia   . Mitral valve regurgitation   . Skin cancer    tumor removed off of his back   Surgical Hx: The patient  has a past surgical history that includes Coronary artery bypass graft (12/21/2000); Tumor removal; Skin cancer excision; Biliary drainage catheter placement w/ bile duct tube change (01/2015); and LEFT HEART CATH AND CORS/GRAFTS ANGIOGRAPHY (N/A, 04/06/2018).   Current Medications: Current Meds  Medication Sig  . apixaban (ELIQUIS) 2.5 MG TABS tablet Take 2.5 mg by mouth 2 (two) times daily.  [name] atorvastatin (LIPITOR) 40 MG tablet Take 40 mg by mouth daily.  . Blood Glucose Monitoring Suppl (ONETOUCH VERIO) w/Device KIT   . carvedilol (COREG) 6.25 MG tablet Take 1 tablet (6.25 mg total) by mouth 2 (two) times daily.  . furosemide (LASIX) 40 MG tablet Take 1.5 tablets (60 mg total) by mouth daily.  . insulin NPH-regular Human (NOVOLIN 70/30) (70-30) 100 UNIT/ML injection Inject 40-45 Units into the skin See admin instructions. 45 units in the morning, 40 units at dinner  . isosorbide  mononitrate (IMDUR) 60 MG 24 hr tablet Take 1 tablet (60 mg total) by mouth daily.  . Lancets (ONETOUCH ULTRASOFT) lancets   . Multiple Vitamins-Minerals (CENTRUM SILVER PO) Take 1 tablet by mouth daily.  . nitroGLYCERIN (NITROSTAT) 0.4 MG SL tablet Place 1 tablet (0.4 mg total) under the tongue every 5 (five) minutes as needed for chest pain.  . Omega-3 Fatty Acids (FISH OIL) 1200 MG CAPS Take 1,200 mg by mouth 2 (two) times daily.   [name] omeprazole (PRILOSEC) 20 MG capsule Take 20 mg by mouth 2 (two) times daily.   Glory Rosebush VERIO test strip   . potassium chloride (K-DUR) 10 MEQ tablet Take 10 mEq by mouth daily.  [name] RELION INSULIN SYRINGE 1ML/31G 31G X 5/16" 1 ML MISC USE 1 TWICE DAILY AS DIRECTED  . [DISCONTINUED] ELIQUIS 5 MG TABS tablet Take 5 mg by mouth 2 (two) times daily.  . [DISCONTINUED] potassium chloride (K-DUR,KLOR-CON) 10 MEQ tablet Take 10 mEq by mouth daily.     Allergies:   Patient has no known allergies.   Social History   Tobacco Use  . Smoking status: Never Smoker  . Smokeless tobacco: Never Used  Substance Use Topics  . Alcohol use: No  . Drug use: No     Family Hx: The patient's family history includes Cancer in his brother and sister; Diabetes in his brother; Heart attack in his father; Hypertension in his mother.  ROS:   Please see the history of present illness.    ROS All other systems reviewed and are negative.   EKGs/Labs/Other Test Reviewed:    EKG:  EKG is not ordered today.   Recent Labs: 04/04/2018: Hemoglobin 13.6; Platelets 189 04/26/2018: BUN 27; Creatinine, Ser 2.05; NT-Pro BNP 2,056; Potassium 4.4; Sodium 140   Labs from PCP 05/05/2018:  Hgb A1c: 6.6%  Recent Lipid Panel Lab Results  Component Value Date/Time   CHOL 143 11/10/2016 08:29 AM   TRIG 128 11/10/2016 08:29 AM   HDL 36 (L) 11/10/2016 08:29 AM   CHOLHDL 4.0 11/10/2016 08:29 AM   CHOLHDL 4.9 10/02/2015 07:33 AM   LDLCALC 81 11/10/2016 08:29 AM   LDLDIRECT 77.1 01/16/2013  04:07 PM    Physical Exam:    VS:  BP 102/60   Pulse 80   Ht 6' (1.829 m)   Wt 192 lb 1.9 oz (87.1 kg)   SpO2 94%   BMI 26.06 kg/m     Wt Readings from Last 3 Encounters:  05/12/18 192 lb 1.9 oz (87.1 kg)  04/19/18 203 lb 6.4 oz (92.3 kg)  04/06/18 201 lb (91.2 kg)     Physical Exam  Constitutional: He is oriented to person, place, and time. He appears well-developed and well-nourished. No distress.  HENT:  Head: Normocephalic and atraumatic.  Eyes: No scleral icterus.  Neck: Neck supple. No JVD present. No thyromegaly present.  Cardiovascular: Normal rate, regular rhythm, S1 normal and S2 normal.  No murmur heard. Pulmonary/Chest: Breath sounds normal. He has no rales.  Abdominal: Soft. There is no hepatomegaly.  Musculoskeletal:        General: Edema (trace bilat LE edema) present.  Lymphadenopathy:    He has no cervical adenopathy.  Neurological: He is alert and oriented to person, place, and time.  Skin: Skin is warm and dry.  Psychiatric: He has a normal mood and affect.    ASSESSMENT & PLAN:    Chronic combined systolic and diastolic CHF (congestive heart failure) (HCC) EF 40-45 by recent Echo.  His volume appears improved.  NYHA 2-3a.  Continue beta-blocker.  He is not on ACE inhibitor due to CKD.  Continue nitrates.  His BP is too low to start Hydralazine.  Obtain BMET today.  Adjust Lasix if needed.  Coronary artery disease involving native coronary artery of native heart with angina pectoris ([place]) History of CABG in 2002.  Recent cardiac catheterization demonstrated patent LIMA-LAD and patent SVG-RPDA/RPLB2.  His vein graft to the OM2 is occluded as well as the vein graft to the first diagonal.  He has collaterals filling the distal circumflex from RPLB1.  Medical therapy has been recommended. Anginal symptoms overall stable.  His BP will not tolerate further adjustment in his nitrate therapy.  Aortic valve insufficiency, etiology of cardiac valve disease  unspecified Mild by recent echo.   Essential hypertension The patient's blood pressure is controlled on his current regimen.  Continue current therapy.    CKD (chronic kidney disease) stage 3, GFR 30-59 ml/min (HCC)  Obtain follow up bmet today.   Dispo:  Return in about 3 months (around 08/12/2018) for Routine Follow Up, w/ Dr. Acie Fredrickson, or Richardson Dopp, PA-C.   Medication Adjustments/Labs and  Tests Ordered: Current medicines are reviewed at length with the patient today.  Concerns regarding medicines are outlined above.  Tests Ordered: No orders of the defined types were placed in this encounter.  Medication Changes: No orders of the defined types were placed in this encounter.   Signed, Richardson Dopp, PA-C  05/12/2018 8:49 AM    [facility] [address], [place], [place]  [address] Phone: [phone]; Fax: [phone]

## 2018-05-12 ENCOUNTER — Encounter: Payer: Self-pay | Admitting: Physician Assistant

## 2018-05-12 ENCOUNTER — Ambulatory Visit (INDEPENDENT_AMBULATORY_CARE_PROVIDER_SITE_OTHER): Payer: PPO | Admitting: Physician Assistant

## 2018-05-12 ENCOUNTER — Other Ambulatory Visit: Payer: PPO | Admitting: *Deleted

## 2018-05-12 ENCOUNTER — Other Ambulatory Visit: Payer: Self-pay

## 2018-05-12 VITALS — BP 102/60 | HR 80 | Ht 72.0 in | Wt 192.1 lb

## 2018-05-12 DIAGNOSIS — N183 Chronic kidney disease, stage 3 unspecified: Secondary | ICD-10-CM

## 2018-05-12 DIAGNOSIS — I5031 Acute diastolic (congestive) heart failure: Secondary | ICD-10-CM | POA: Diagnosis not present

## 2018-05-12 DIAGNOSIS — I5042 Chronic combined systolic (congestive) and diastolic (congestive) heart failure: Secondary | ICD-10-CM | POA: Diagnosis not present

## 2018-05-12 DIAGNOSIS — I351 Nonrheumatic aortic (valve) insufficiency: Secondary | ICD-10-CM

## 2018-05-12 DIAGNOSIS — I10 Essential (primary) hypertension: Secondary | ICD-10-CM | POA: Diagnosis not present

## 2018-05-12 DIAGNOSIS — I25119 Atherosclerotic heart disease of native coronary artery with unspecified angina pectoris: Secondary | ICD-10-CM | POA: Diagnosis not present

## 2018-05-12 LAB — BASIC METABOLIC PANEL
BUN/Creatinine Ratio: 17 (ref 10–24)
BUN: 33 mg/dL — ABNORMAL HIGH (ref 8–27)
CO2: 22 mmol/L (ref 20–29)
CREATININE: 1.99 mg/dL — AB (ref 0.76–1.27)
Calcium: 9.2 mg/dL (ref 8.6–10.2)
Chloride: 101 mmol/L (ref 96–106)
GFR calc Af Amer: 35 mL/min/{1.73_m2} — ABNORMAL LOW (ref >59)
GFR calc non Af Amer: 30 mL/min/{1.73_m2} — ABNORMAL LOW (ref >59)
Glucose: 164 mg/dL — ABNORMAL HIGH (ref 65–99)
Potassium: 4.1 mmol/L (ref 3.5–5.2)
SODIUM: 139 mmol/L (ref 134–144)

## 2018-05-12 NOTE — Patient Instructions (Signed)
Medication Instructions:  Continue your current medications.  If you need a refill on your cardiac medications before your next appointment, please call your pharmacy.   Lab work: Today - BMET  If you have labs (blood work) drawn today and your tests are completely normal, you will receive your results only by: [name] MyChart Message (if you have MyChart) OR . A paper copy in the mail If you have any lab test that is abnormal or we need to change your treatment, we will call you to review the results.  Testing/Procedures: None   Follow-Up: At [facility], you and your health needs are our priority.  As part of our continuing mission to provide you with exceptional heart care, we have created designated Provider Care Teams.  These Care Teams include your primary Cardiologist (physician) and Advanced Practice Providers (APPs -  Physician Assistants and Nurse Practitioners) who all work together to provide you with the care you need, when you need it. You will need a follow up appointment in:  3 months.  Please call our office 2 months in advance to schedule this appointment.  You may see [name], MD or Richardson Dopp, PA-C   Any Other Special Instructions Will Be Listed Below (If Applicable). Call if your breathing gets worse or you notice more leg swelling.

## 2018-05-17 DIAGNOSIS — R7301 Impaired fasting glucose: Secondary | ICD-10-CM | POA: Diagnosis not present

## 2018-05-17 DIAGNOSIS — J9690 Respiratory failure, unspecified, unspecified whether with hypoxia or hypercapnia: Secondary | ICD-10-CM | POA: Diagnosis not present

## 2018-05-17 DIAGNOSIS — S82851A Displaced trimalleolar fracture of right lower leg, initial encounter for closed fracture: Secondary | ICD-10-CM | POA: Diagnosis not present

## 2018-05-17 DIAGNOSIS — C44629 Squamous cell carcinoma of skin of left upper limb, including shoulder: Secondary | ICD-10-CM | POA: Diagnosis not present

## 2018-05-17 DIAGNOSIS — M542 Cervicalgia: Secondary | ICD-10-CM | POA: Diagnosis not present

## 2018-05-17 DIAGNOSIS — N400 Enlarged prostate without lower urinary tract symptoms: Secondary | ICD-10-CM | POA: Diagnosis not present

## 2018-05-17 DIAGNOSIS — D485 Neoplasm of uncertain behavior of skin: Secondary | ICD-10-CM | POA: Diagnosis not present

## 2018-05-17 DIAGNOSIS — E782 Mixed hyperlipidemia: Secondary | ICD-10-CM | POA: Diagnosis not present

## 2018-05-17 DIAGNOSIS — J449 Chronic obstructive pulmonary disease, unspecified: Secondary | ICD-10-CM | POA: Diagnosis not present

## 2018-05-17 DIAGNOSIS — M25561 Pain in right knee: Secondary | ICD-10-CM | POA: Diagnosis not present

## 2018-05-17 DIAGNOSIS — D649 Anemia, unspecified: Secondary | ICD-10-CM | POA: Diagnosis not present

## 2018-05-17 DIAGNOSIS — R972 Elevated prostate specific antigen [PSA]: Secondary | ICD-10-CM | POA: Diagnosis not present

## 2018-05-17 DIAGNOSIS — C4441 Basal cell carcinoma of skin of scalp and neck: Secondary | ICD-10-CM | POA: Diagnosis not present

## 2018-05-17 DIAGNOSIS — I10 Essential (primary) hypertension: Secondary | ICD-10-CM | POA: Diagnosis not present

## 2018-05-17 DIAGNOSIS — E0590 Thyrotoxicosis, unspecified without thyrotoxic crisis or storm: Secondary | ICD-10-CM | POA: Diagnosis not present

## 2018-05-17 DIAGNOSIS — M8949 Other hypertrophic osteoarthropathy, multiple sites: Secondary | ICD-10-CM | POA: Diagnosis not present

## 2018-05-22 DIAGNOSIS — R6889 Other general symptoms and signs: Secondary | ICD-10-CM | POA: Diagnosis not present

## 2018-05-22 DIAGNOSIS — D440 Neoplasm of uncertain behavior of thyroid gland: Secondary | ICD-10-CM | POA: Diagnosis not present

## 2018-05-22 DIAGNOSIS — H903 Sensorineural hearing loss, bilateral: Secondary | ICD-10-CM | POA: Diagnosis not present

## 2018-05-22 DIAGNOSIS — N401 Enlarged prostate with lower urinary tract symptoms: Secondary | ICD-10-CM | POA: Diagnosis not present

## 2018-05-22 DIAGNOSIS — R3912 Poor urinary stream: Secondary | ICD-10-CM | POA: Diagnosis not present

## 2018-05-24 DIAGNOSIS — T8141XA Infection following a procedure, superficial incisional surgical site, initial encounter: Secondary | ICD-10-CM | POA: Diagnosis not present

## 2018-06-05 ENCOUNTER — Telehealth: Payer: Self-pay

## 2018-06-05 ENCOUNTER — Telehealth: Payer: Self-pay | Admitting: Cardiovascular Disease

## 2018-06-05 NOTE — Telephone Encounter (Signed)
Spoke with pt and he would like to have a phone visit with Dr. Acie Fredrickson. Pt is still undecided as to whether he will proceed. He does not want to have to pay his co-pay for a telephone call.

## 2018-06-05 NOTE — Telephone Encounter (Signed)
No message needed °

## 2018-06-05 NOTE — Telephone Encounter (Signed)
Contacted pt to follow up on return call.

## 2018-06-05 NOTE — Telephone Encounter (Signed)
Left message for pt to call back about options for upcoming appt.

## 2018-06-05 NOTE — Telephone Encounter (Signed)
Follow up  ° ° °Patient is returning your call. °

## 2018-06-13 ENCOUNTER — Telehealth: Payer: PPO | Admitting: Cardiovascular Disease

## 2018-06-30 ENCOUNTER — Other Ambulatory Visit: Payer: Self-pay | Admitting: Cardiovascular Disease

## 2018-06-30 NOTE — Telephone Encounter (Signed)
°*  STAT* If patient is at the pharmacy, call can be transferred to refill team.   1. Which medications need to be refilled? (please list name of each medication and dose if known) Furosemide 60 gm  2. Which pharmacy/location (including street and city if local pharmacy) is medication to be sent to?  [facility]  3. Do they need a 30 day or 90 day supply? [place]

## 2018-07-03 MED ORDER — FUROSEMIDE 40 MG PO TABS: 60.0000 mg | ORAL_TABLET | Freq: Every day | ORAL | 3 refills | Status: DC

## 2018-07-03 NOTE — Telephone Encounter (Signed)
Pt's medication was sent to pt's pharmacy as requested. Confirmation received.  °

## 2018-07-07 ENCOUNTER — Encounter: Payer: Self-pay | Admitting: Gastroenterology

## 2018-07-11 DIAGNOSIS — C4441 Basal cell carcinoma of skin of scalp and neck: Secondary | ICD-10-CM | POA: Diagnosis not present

## 2018-07-31 DIAGNOSIS — C4441 Basal cell carcinoma of skin of scalp and neck: Secondary | ICD-10-CM | POA: Diagnosis not present

## 2018-07-31 DIAGNOSIS — L814 Other melanin hyperpigmentation: Secondary | ICD-10-CM | POA: Diagnosis not present

## 2018-07-31 DIAGNOSIS — Z85828 Personal history of other malignant neoplasm of skin: Secondary | ICD-10-CM | POA: Diagnosis not present

## 2018-07-31 DIAGNOSIS — D1801 Hemangioma of skin and subcutaneous tissue: Secondary | ICD-10-CM | POA: Diagnosis not present

## 2018-07-31 DIAGNOSIS — D225 Melanocytic nevi of trunk: Secondary | ICD-10-CM | POA: Diagnosis not present

## 2018-07-31 DIAGNOSIS — L821 Other seborrheic keratosis: Secondary | ICD-10-CM | POA: Diagnosis not present

## 2018-07-31 DIAGNOSIS — C44321 Squamous cell carcinoma of skin of nose: Secondary | ICD-10-CM | POA: Diagnosis not present

## 2018-07-31 DIAGNOSIS — L570 Actinic keratosis: Secondary | ICD-10-CM | POA: Diagnosis not present

## 2018-08-11 ENCOUNTER — Encounter: Payer: Self-pay | Admitting: Podiatry

## 2018-08-11 ENCOUNTER — Ambulatory Visit: Payer: PPO | Admitting: Podiatry

## 2018-08-11 ENCOUNTER — Other Ambulatory Visit: Payer: Self-pay

## 2018-08-11 DIAGNOSIS — M79674 Pain in right toe(s): Secondary | ICD-10-CM

## 2018-08-11 DIAGNOSIS — E118 Type 2 diabetes mellitus with unspecified complications: Secondary | ICD-10-CM

## 2018-08-11 DIAGNOSIS — B351 Tinea unguium: Secondary | ICD-10-CM

## 2018-08-11 DIAGNOSIS — D689 Coagulation defect, unspecified: Secondary | ICD-10-CM

## 2018-08-11 DIAGNOSIS — M79675 Pain in left toe(s): Secondary | ICD-10-CM

## 2018-08-11 NOTE — Progress Notes (Signed)
Patient ID: Nathan [name], male   DOB: 1933/12/04, 85 y.o.   MRN: [id number] Complaint:  Visit Type: Patient returns to my office for continued preventative foot care services. Complaint: Patient states" my nails have grown long and thick and become painful to walk and wear shoes" Patient has been diagnosed with DM with no foot complications. The patient presents for preventative foot care services. No changes to ROS  Podiatric Exam: Vascular: dorsalis pedis and posterior tibial pulses are palpable bilateral. Capillary return is immediate. Temperature gradient is WNL. Skin turgor WNL  Sensorium: Diminished  Semmes Weinstein monofilament test. Normal tactile sensation bilaterally. Nail Exam: Pt has thick disfigured discolored nails with subungual debris noted bilateral entire nail hallux through fifth toenails Ulcer Exam: There is no evidence of ulcer or pre-ulcerative changes or infection. Orthopedic Exam: Muscle tone and strength are WNL. No limitations in general ROM. No crepitus or effusions noted. Foot type and digits show no abnormalities. Bony prominences are unremarkable.Hammer toes second B/l.  MCJ DJD  B/L. Skin: No Porokeratosis. No infection or ulcers.   Diagnosis:  Onychomycosis, , Pain in right toe, pain in left toes  Treatment & Plan Procedures and Treatment: Consent by patient was obtained for treatment procedures. The patient understood the discussion of treatment and procedures well. All questions were answered thoroughly reviewed. Debridement of mycotic and hypertrophic toenails, 1 through 5 bilateral and clearing of subungual debris. No ulceration, no infection noted.  Appt. With [facility] for shoes for DPN, hammer toes and arthritis. Return Visit-Office Procedure: Patient instructed to return to the office for a follow up visit 3 months for continued evaluation and treatment.   Gardiner Barefoot DPM

## 2018-08-28 ENCOUNTER — Ambulatory Visit: Payer: PPO | Admitting: Orthotics

## 2018-08-28 ENCOUNTER — Other Ambulatory Visit: Payer: Self-pay

## 2018-08-28 DIAGNOSIS — M2042 Other hammer toe(s) (acquired), left foot: Secondary | ICD-10-CM

## 2018-08-28 DIAGNOSIS — E118 Type 2 diabetes mellitus with unspecified complications: Secondary | ICD-10-CM

## 2018-08-28 DIAGNOSIS — M79674 Pain in right toe(s): Secondary | ICD-10-CM

## 2018-08-28 DIAGNOSIS — B351 Tinea unguium: Secondary | ICD-10-CM

## 2018-08-28 DIAGNOSIS — M2041 Other hammer toe(s) (acquired), right foot: Secondary | ICD-10-CM

## 2018-08-28 NOTE — Progress Notes (Signed)

## 2018-09-13 DIAGNOSIS — H52223 Regular astigmatism, bilateral: Secondary | ICD-10-CM | POA: Diagnosis not present

## 2018-09-13 DIAGNOSIS — H04123 Dry eye syndrome of bilateral lacrimal glands: Secondary | ICD-10-CM | POA: Diagnosis not present

## 2018-09-13 DIAGNOSIS — H5203 Hypermetropia, bilateral: Secondary | ICD-10-CM | POA: Diagnosis not present

## 2018-09-13 DIAGNOSIS — E119 Type 2 diabetes mellitus without complications: Secondary | ICD-10-CM | POA: Diagnosis not present

## 2018-09-13 DIAGNOSIS — H524 Presbyopia: Secondary | ICD-10-CM | POA: Diagnosis not present

## 2018-09-18 DIAGNOSIS — N401 Enlarged prostate with lower urinary tract symptoms: Secondary | ICD-10-CM | POA: Diagnosis not present

## 2018-09-18 DIAGNOSIS — Z794 Long term (current) use of insulin: Secondary | ICD-10-CM | POA: Diagnosis not present

## 2018-09-18 DIAGNOSIS — R0600 Dyspnea, unspecified: Secondary | ICD-10-CM | POA: Diagnosis not present

## 2018-09-18 DIAGNOSIS — E1129 Type 2 diabetes mellitus with other diabetic kidney complication: Secondary | ICD-10-CM | POA: Diagnosis not present

## 2018-09-18 DIAGNOSIS — N183 Chronic kidney disease, stage 3 (moderate): Secondary | ICD-10-CM | POA: Diagnosis not present

## 2018-09-18 DIAGNOSIS — I130 Hypertensive heart and chronic kidney disease with heart failure and stage 1 through stage 4 chronic kidney disease, or unspecified chronic kidney disease: Secondary | ICD-10-CM | POA: Diagnosis not present

## 2018-09-19 DIAGNOSIS — E1129 Type 2 diabetes mellitus with other diabetic kidney complication: Secondary | ICD-10-CM | POA: Diagnosis not present

## 2018-09-22 ENCOUNTER — Ambulatory Visit: Payer: PPO | Admitting: Orthotics

## 2018-09-22 ENCOUNTER — Ambulatory Visit (INDEPENDENT_AMBULATORY_CARE_PROVIDER_SITE_OTHER): Payer: PPO | Admitting: Cardiovascular Disease

## 2018-09-22 ENCOUNTER — Encounter: Payer: Self-pay | Admitting: Cardiovascular Disease

## 2018-09-22 ENCOUNTER — Other Ambulatory Visit: Payer: Self-pay

## 2018-09-22 VITALS — BP 140/74 | HR 83 | Ht 72.0 in | Wt 193.6 lb

## 2018-09-22 DIAGNOSIS — I25119 Atherosclerotic heart disease of native coronary artery with unspecified angina pectoris: Secondary | ICD-10-CM

## 2018-09-22 DIAGNOSIS — M216X2 Other acquired deformities of left foot: Secondary | ICD-10-CM

## 2018-09-22 DIAGNOSIS — M216X1 Other acquired deformities of right foot: Secondary | ICD-10-CM

## 2018-09-22 DIAGNOSIS — I5042 Chronic combined systolic (congestive) and diastolic (congestive) heart failure: Secondary | ICD-10-CM | POA: Diagnosis not present

## 2018-09-22 DIAGNOSIS — M2042 Other hammer toe(s) (acquired), left foot: Secondary | ICD-10-CM

## 2018-09-22 DIAGNOSIS — M2041 Other hammer toe(s) (acquired), right foot: Secondary | ICD-10-CM

## 2018-09-22 DIAGNOSIS — E1140 Type 2 diabetes mellitus with diabetic neuropathy, unspecified: Secondary | ICD-10-CM

## 2018-09-22 MED ORDER — HYDRALAZINE HCL 25 MG PO TABS: 25.0000 mg | ORAL_TABLET | Freq: Two times a day (BID) | ORAL | 3 refills | Status: DC

## 2018-09-22 NOTE — Progress Notes (Signed)
Nathan [name] Date of Birth  May 13, 1933       [facility]    [employer] [address], [place], [place]  [address]   [place],   [address] [phone]     [phone]   Fax  [phone]    Fax [phone]  Problem List: 1. Coronary artery disease-status post CABG 2. Aortic insufficiency 3. Hypercholesterolemia 4. Diabetes mellitus 5. Mitral regurgitation     Nathan [name] is doing very well. He's not had any episodes of chest pain or shortness of breath. 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  Dec. 2, 2014:  Nathan [name] is doing well.  Not as much energy.   No CP or dyspnea.    Dec. 3,2015:  Doing well from a cardiac standpoint.  started taking insulin recently.   Worked again for [name].   Dec. 2, 2016:  Doing well from a cardiac standpoint.   BP is high today , was normal yesterday   Feb. 14, 2017:   Nathan [name] is seen back for a visit BP is a bit elevated today .   Eats lots of salty foods ( soup for dinner) He had acute cholecystitis and had a perc drain placed.  The drain has been removed.  He never had his GB removed.  Was placed on home O2 during the hospitalization  And has been on home O2 since He does not think he needs it.   He occasionally will forget to turn it on and cannot tell the difference.  Will check his O2 sate on RA. ( time off oxygen  1440)   During his hospitalization, he had an echocardiogram that reveals normal left ventricle systolic function. He did have mild pulmonary hypertension. He had a stress Myoview study which revealed normal left ventricular systolic function and revealed no ischemia.  Aug. 17, 2017:  Doing well. Had the gall bladder percutaneous drain removed. Did not have GB removed.  Stands on his feet all day , has some leg edema   Feb. 13, 2018:  Doing well .   No further gall  bladder issues  Sept.  26, 2018:  Nathan [name] is seen today for follow-up of his coronary artery disease. Also  has a history of essential hypertension.  May 31, 2017 Doing well . Strawberries  Are ready - hes still working  No CP  No further gall bladder issues  August 29, 2017:  Nathan [name] is seen today  Has been having lots of orthostasis  Lightheadness. Has not been working - works at [name] - in [employer] building  Has been having lots of GI issues ( belching and GERD after eating ) , thinks he is having gall bladder issues.  Appetite has not been good  Labs from [name] office show a creatinine of 2.3  Had 4 of his pills stopped last week  Taking fish oil, amlodipine, ASA and potassium ( by mistake, was supposed to stop)  , MVI  Still eating salty meats   Oct. 21, 2019:  Nathan [name] is seen today  Has not had a good appetite for the past several months  Has a left leg DVT - Is now on Eliquis  orthostasis has improved    September 22, 2018:  Doing well.   No CP , no dyspnea.   BP is a little  elevated.   Current Outpatient Medications on File Prior to Visit  Medication Sig Dispense Refill  . apixaban (ELIQUIS) 2.5 MG TABS tablet Take 2.5 mg by mouth 2 (two) times daily.    [name] atorvastatin (LIPITOR) 40 MG tablet Take 40 mg by mouth daily.  3  . Blood Glucose Monitoring Suppl (ONETOUCH VERIO) w/Device KIT     . carvedilol (COREG) 6.25 MG tablet Take 6.25 mg by mouth daily.    . furosemide (LASIX) 40 MG tablet Take 1.5 tablets (60 mg total) by mouth daily. 135 tablet 3  . insulin NPH-regular Human (NOVOLIN 70/30) (70-30) 100 UNIT/ML injection Inject 40-45 Units into the skin See admin instructions. 45 units in the morning, 40 units at dinner    . isosorbide mononitrate (IMDUR) 60 MG 24 hr tablet Take 1 tablet (60 mg total) by mouth daily. 90 tablet 3  . Lancets (ONETOUCH ULTRASOFT) lancets     . Multiple Vitamins-Minerals (CENTRUM SILVER PO) Take 1 tablet by mouth daily.     . nitroGLYCERIN (NITROSTAT) 0.4 MG SL tablet Place 1 tablet (0.4 mg total) under the tongue every 5 (five) minutes as needed for chest pain. 25 tablet 5  . Omega-3 Fatty Acids (FISH OIL) 1200 MG CAPS Take 1,200 mg by mouth 2 (two) times daily.     [name] omeprazole (PRILOSEC) 20 MG capsule Take 20 mg by mouth 2 (two) times daily.     Glory Rosebush VERIO test strip     . potassium chloride (K-DUR) 10 MEQ tablet Take 10 mEq by mouth daily.    [name] RELION INSULIN SYRINGE 1ML/31G 31G X 5/16" 1 ML MISC USE 1 TWICE DAILY AS DIRECTED  6   No current facility-administered medications on file prior to visit.     No Known Allergies  Past Medical History:  Diagnosis Date  . Acid reflux   . Aortic insufficiency   . Cholecystitis   . Combined systolic and diastolic heart failure ([place])    Echo 04/2018: inf-lat HK, EF 40-45, severe basal septal hypertrophy, mild conc LVH, Gr 1 DD, severe LAE, mild to mod TR, mod AI, asc Aorta 39 mm  . Coronary artery disease 2002   CABG x5  . Diabetes mellitus   . DVT (deep venous thrombosis) ([facility])    previously on coumadin  . Hypercholesteremia   . Hyperlipidemia   . Mitral valve regurgitation   . Skin cancer    tumor removed off of his back    Past Surgical History:  Procedure Laterality Date  . BILIARY DRAINAGE CATHETER PLACEMENT W/ BILE DUCT TUBE CHANGE  01/2015  . CORONARY ARTERY BYPASS GRAFT  12/21/2000   x5  . LEFT HEART CATH AND CORS/GRAFTS ANGIOGRAPHY N/A 04/06/2018   Procedure: LEFT HEART CATH AND CORS/GRAFTS ANGIOGRAPHY;  Surgeon: Lorretta Harp, MD;  Location: [facility];  Service: Cardiovascular;  Laterality: N/A;  . SKIN CANCER EXCISION    . TUMOR REMOVAL     off of back, skin cancer    Social History   Tobacco Use  Smoking Status Never Smoker  Smokeless Tobacco Never Used    Social History   Substance and Sexual Activity  Alcohol Use No    Family History  Problem Relation Age of Onset  . Heart attack Father   . Hypertension  Mother   . Diabetes Brother   . Cancer Sister        breast  . Cancer Brother  throat & mouth    Reviw of Systems:  Noted in current history, otherwise review of systems is negative.   Physical Exam: Blood pressure 140/74, pulse 83, height 6' (1.829 m), weight 193 lb 9.6 oz (87.8 kg), SpO2 95 %.  GEN:  Well nourished, well developed in no acute distress HEENT: Normal NECK: No JVD; No carotid bruits LYMPHATICS: No lymphadenopathy CARDIAC: RRR  Soft mumur  RESPIRATORY:  Clear to auscultation without rales, wheezing or rhonchi  ABDOMEN: Soft, non-tender, non-distended MUSCULOSKELETAL:  No edema; No deformity  SKIN: Warm and dry NEUROLOGIC:  Alert and oriented x 3   ECG:      Assessment / Plan:   1. Coronary artery disease-      No angina   2.  Chronic combined systolic / diastolic CHF:   On coreg.   Has CKD.  Will avoid ARB.   Add hydralizine 25 mg BID   3.  Left leg DVT :    On eliquis   2. Aortic insufficiency -  Stable  3. Hypercholesterolemia-        Return in 6 months   [name], MD  09/22/2018 4:22 PM    [address] [place] [place],  [place], [place]  [address] Pager [phone] Phone: [phone]; Fax: [phone]

## 2018-09-22 NOTE — Patient Instructions (Signed)
Medication Instructions:  Your physician has recommended you make the following change in your medication:  START Hydralazine (Apresoline) 25 mg twice daily  If you need a refill on your cardiac medications before your next appointment, please call your pharmacy.    Lab work: None Ordered    Testing/Procedures: None Ordered   Follow-Up: At [facility], you and your health needs are our priority.  As part of our continuing mission to provide you with exceptional heart care, we have created designated Provider Care Teams.  These Care Teams include your primary Cardiologist (physician) and Advanced Practice Providers (APPs -  Physician Assistants and Nurse Practitioners) who all work together to provide you with the care you need, when you need it. You will need a follow up appointment in:  6 months.  Please call our office 2 months in advance to schedule this appointment.  You may see [name], MD or one of the following Advanced Practice Providers on your designated Care Team: Richardson Dopp, PA-C [place], [place] . Daune Perch, NP

## 2018-09-26 DIAGNOSIS — C4441 Basal cell carcinoma of skin of scalp and neck: Secondary | ICD-10-CM | POA: Diagnosis not present

## 2018-10-09 DIAGNOSIS — E1129 Type 2 diabetes mellitus with other diabetic kidney complication: Secondary | ICD-10-CM | POA: Diagnosis not present

## 2018-10-09 DIAGNOSIS — I130 Hypertensive heart and chronic kidney disease with heart failure and stage 1 through stage 4 chronic kidney disease, or unspecified chronic kidney disease: Secondary | ICD-10-CM | POA: Diagnosis not present

## 2018-10-09 DIAGNOSIS — I83812 Varicose veins of left lower extremities with pain: Secondary | ICD-10-CM | POA: Diagnosis not present

## 2018-10-09 DIAGNOSIS — N183 Chronic kidney disease, stage 3 (moderate): Secondary | ICD-10-CM | POA: Diagnosis not present

## 2018-10-09 DIAGNOSIS — I509 Heart failure, unspecified: Secondary | ICD-10-CM | POA: Diagnosis not present

## 2018-10-09 DIAGNOSIS — I82402 Acute embolism and thrombosis of unspecified deep veins of left lower extremity: Secondary | ICD-10-CM | POA: Diagnosis not present

## 2018-10-10 ENCOUNTER — Other Ambulatory Visit (HOSPITAL_COMMUNITY): Payer: Self-pay | Admitting: Internal Medicine

## 2018-10-10 ENCOUNTER — Other Ambulatory Visit: Payer: Self-pay

## 2018-10-10 ENCOUNTER — Ambulatory Visit (HOSPITAL_COMMUNITY)
Admission: RE | Admit: 2018-10-10 | Discharge: 2018-10-10 | Disposition: A | Discharge status: Home / Self Care | Payer: PPO | Source: Ambulatory Visit | Attending: Family | Admitting: Family

## 2018-10-10 ENCOUNTER — Telehealth (HOSPITAL_COMMUNITY): Payer: Self-pay | Admitting: Rehabilitation

## 2018-10-10 DIAGNOSIS — I82412 Acute embolism and thrombosis of left femoral vein: Secondary | ICD-10-CM

## 2018-10-10 DIAGNOSIS — L570 Actinic keratosis: Secondary | ICD-10-CM | POA: Diagnosis not present

## 2018-10-10 DIAGNOSIS — C44321 Squamous cell carcinoma of skin of nose: Secondary | ICD-10-CM | POA: Diagnosis not present

## 2018-11-17 ENCOUNTER — Other Ambulatory Visit: Payer: Self-pay

## 2018-11-17 ENCOUNTER — Encounter: Payer: Self-pay | Admitting: Podiatry

## 2018-11-17 ENCOUNTER — Ambulatory Visit: Payer: PPO | Admitting: Podiatry

## 2018-11-17 DIAGNOSIS — M79675 Pain in left toe(s): Secondary | ICD-10-CM | POA: Diagnosis not present

## 2018-11-17 DIAGNOSIS — M79674 Pain in right toe(s): Secondary | ICD-10-CM

## 2018-11-17 DIAGNOSIS — E118 Type 2 diabetes mellitus with unspecified complications: Secondary | ICD-10-CM

## 2018-11-17 DIAGNOSIS — B351 Tinea unguium: Secondary | ICD-10-CM | POA: Diagnosis not present

## 2018-11-17 NOTE — Progress Notes (Signed)
Patient ID: Nathan [name], male   DOB: 03/27/1933, 85 y.o.   MRN: [id number] Complaint:  Visit Type: Patient returns to my office for continued preventative foot care services. Complaint: Patient states" my nails have grown long and thick and become painful to walk and wear shoes" Patient has been diagnosed with DM with no foot complications. The patient presents for preventative foot care services. No changes to ROS  Podiatric Exam: Vascular: dorsalis pedis and posterior tibial pulses are palpable bilateral. Capillary return is immediate. Temperature gradient is WNL. Skin turgor WNL  Sensorium: Diminished  Semmes Weinstein monofilament test. Normal tactile sensation bilaterally. Nail Exam: Pt has thick disfigured discolored nails with subungual debris noted bilateral entire nail hallux through fifth toenails Ulcer Exam: There is no evidence of ulcer or pre-ulcerative changes or infection. Orthopedic Exam: Muscle tone and strength are WNL. No limitations in general ROM. No crepitus or effusions noted. Foot type and digits show no abnormalities. Bony prominences are unremarkable.Hammer toes second B/l.  MCJ DJD  B/L. Skin: No Porokeratosis. No infection or ulcers.   Diagnosis:  Onychomycosis, , Pain in right toe, pain in left toes  Treatment & Plan Procedures and Treatment: Consent by patient was obtained for treatment procedures. The patient understood the discussion of treatment and procedures well. All questions were answered thoroughly reviewed. Debridement of mycotic and hypertrophic toenails, 1 through 5 bilateral and clearing of subungual debris. No ulceration, no infection noted.   Return Visit-Office Procedure: Patient instructed to return to the office for a follow up visit 3 months for continued evaluation and treatment.   Gardiner Barefoot DPM

## 2018-12-11 ENCOUNTER — Other Ambulatory Visit: Payer: Self-pay

## 2018-12-11 ENCOUNTER — Telehealth (HOSPITAL_COMMUNITY): Payer: Self-pay

## 2018-12-11 DIAGNOSIS — I83892 Varicose veins of left lower extremities with other complications: Secondary | ICD-10-CM

## 2018-12-11 NOTE — Telephone Encounter (Signed)

## 2018-12-12 ENCOUNTER — Ambulatory Visit (INDEPENDENT_AMBULATORY_CARE_PROVIDER_SITE_OTHER): Payer: PPO | Admitting: Vascular Surgery

## 2018-12-12 ENCOUNTER — Ambulatory Visit (HOSPITAL_COMMUNITY)
Admission: RE | Admit: 2018-12-12 | Discharge: 2018-12-12 | Disposition: A | Discharge status: Home / Self Care | Payer: PPO | Source: Ambulatory Visit | Attending: Vascular Surgery | Admitting: Vascular Surgery

## 2018-12-12 ENCOUNTER — Encounter: Payer: Self-pay | Admitting: Vascular Surgery

## 2018-12-12 ENCOUNTER — Other Ambulatory Visit: Payer: Self-pay

## 2018-12-12 DIAGNOSIS — I83892 Varicose veins of left lower extremities with other complications: Secondary | ICD-10-CM | POA: Diagnosis not present

## 2018-12-12 DIAGNOSIS — I872 Venous insufficiency (chronic) (peripheral): Secondary | ICD-10-CM | POA: Insufficient documentation

## 2018-12-12 NOTE — Progress Notes (Signed)
Patient name: Nathan [name] MRN: [id number] DOB: [birth year] Sex: male  REASON FOR CONSULT: Engorged varicosities left lower extremity  HPI: Nathan [name] is a 85 y.o. male, with history heart failure, coronary artery disease status post CABG in 2002, right leg DVT in 2012, chronic kidney disease, left leg DVT in the common femoral and profunda in 2019 that presents for evaluation of varicosity in his left leg.  Patient states he has no pain, discomfort, or heaviness in the left leg.  He just noticed there was a prominent vein on the lateral calf above the ankle in his left leg.  No previous wounds in the left lower extremity.  He is on Eliquis for history of multiple DVTs in the right leg and left leg as noted above.  No previous lower extremity venous interventions.  Has had GSV harvested from both lower extremities for coronary bypass.  States he gets numbness in his left toes whenever the weather gets cold.  When asked about walking and claudication symptoms he denies any leg issues.  States he played 4 rounds of golf this week with no issues with his left leg.  Already has compression socks that he wears most days.  Past Medical History:  Diagnosis Date  . Acid reflux   . Aortic insufficiency   . Cholecystitis   . Combined systolic and diastolic heart failure ([place])    Echo 04/2018: inf-lat HK, EF 40-45, severe basal septal hypertrophy, mild conc LVH, Gr 1 DD, severe LAE, mild to mod TR, mod AI, asc Aorta 39 mm  . Coronary artery disease 2002   CABG x5  . Diabetes mellitus   . DVT (deep venous thrombosis) ([facility])    previously on coumadin  . Hypercholesteremia   . Hyperlipidemia   . Mitral valve regurgitation   . Skin cancer    tumor removed off of his back    Past Surgical History:  Procedure Laterality Date  . BILIARY DRAINAGE CATHETER PLACEMENT W/ BILE DUCT TUBE CHANGE  01/2015  . CORONARY ARTERY BYPASS GRAFT  12/21/2000   x5  . LEFT HEART CATH AND CORS/GRAFTS ANGIOGRAPHY N/A  04/06/2018   Procedure: LEFT HEART CATH AND CORS/GRAFTS ANGIOGRAPHY;  Surgeon: Lorretta Harp, MD;  Location: [facility];  Service: Cardiovascular;  Laterality: N/A;  . SKIN CANCER EXCISION    . TUMOR REMOVAL     off of back, skin cancer    Family History  Problem Relation Age of Onset  . Heart attack Father   . Hypertension Mother   . Diabetes Brother   . Cancer Sister        breast  . Cancer Brother        throat & mouth    SOCIAL HISTORY: Social History   Socioeconomic History  . Marital status: Divorced    Spouse name: Not on file  . Number of children: Not on file  . Years of education: Not on file  . Highest education level: Not on file  Occupational History  . Not on file  Social Needs  . Financial resource strain: Not on file  . Food insecurity    Worry: Not on file    Inability: Not on file  . Transportation needs    Medical: Not on file    Non-medical: Not on file  Tobacco Use  . Smoking status: Never Smoker  . Smokeless tobacco: Never Used  Substance and Sexual Activity  . Alcohol use: No  . Drug use:  No  . Sexual activity: Not Currently  Lifestyle  . Physical activity    Days per week: Not on file    Minutes per session: Not on file  . Stress: Not on file  Relationships  . Social Herbalist on phone: Not on file    Gets together: Not on file    Attends religious service: Not on file    Active member of club or organization: Not on file    Attends meetings of clubs or organizations: Not on file    Relationship status: Not on file  . Intimate partner violence    Fear of current or ex partner: Not on file    Emotionally abused: Not on file    Physically abused: Not on file    Forced sexual activity: Not on file  Other Topics Concern  . Not on file  Social History Narrative  . Not on file    No Known Allergies  Current Outpatient Medications  Medication Sig Dispense Refill  . apixaban (ELIQUIS) 2.5 MG TABS tablet Take  2.5 mg by mouth 2 (two) times daily.    [name] atorvastatin (LIPITOR) 40 MG tablet Take 40 mg by mouth daily.  3  . Blood Glucose Monitoring Suppl (ONETOUCH VERIO) w/Device KIT     . carvedilol (COREG) 6.25 MG tablet Take 6.25 mg by mouth daily.    . furosemide (LASIX) 40 MG tablet Take 1.5 tablets (60 mg total) by mouth daily. 135 tablet 3  . hydrALAZINE (APRESOLINE) 25 MG tablet Take 1 tablet (25 mg total) by mouth 2 (two) times daily. 180 tablet 3  . insulin NPH-regular Human (NOVOLIN 70/30) (70-30) 100 UNIT/ML injection Inject 40-45 Units into the skin See admin instructions. 45 units in the morning, 40 units at dinner    . isosorbide mononitrate (IMDUR) 60 MG 24 hr tablet Take 1 tablet (60 mg total) by mouth daily. 90 tablet 3  . Lancets (ONETOUCH ULTRASOFT) lancets     . Multiple Vitamins-Minerals (CENTRUM SILVER PO) Take 1 tablet by mouth daily.    . nitroGLYCERIN (NITROSTAT) 0.4 MG SL tablet Place 1 tablet (0.4 mg total) under the tongue every 5 (five) minutes as needed for chest pain. 25 tablet 5  . Omega-3 Fatty Acids (FISH OIL) 1200 MG CAPS Take 1,200 mg by mouth 2 (two) times daily.     [name] omeprazole (PRILOSEC) 20 MG capsule Take 20 mg by mouth 2 (two) times daily.     Glory Rosebush VERIO test strip     . potassium chloride (K-DUR) 10 MEQ tablet Take 10 mEq by mouth daily.    [name] RELION INSULIN SYRINGE 1ML/31G 31G X 5/16" 1 ML MISC USE 1 TWICE DAILY AS DIRECTED  6  . fluorouracil (EFUDEX) 5 % cream APPLY CREAM TO THE NOSE TWICE DAILY FOR 1 WEEK     No current facility-administered medications for this visit.     REVIEW OF SYSTEMS:  _0  denotes positive finding, _1  denotes negative finding Cardiac  Comments:  Chest pain or chest pressure:    Shortness of breath upon exertion:    Short of breath when lying flat:    Irregular heart rhythm:        Vascular    Pain in calf, thigh, or hip brought on by ambulation:    Pain in feet at night that wakes you up from your sleep:     Blood clot  in your veins:    Leg swelling:  Pulmonary    Oxygen at home:    Productive cough:     Wheezing:         Neurologic    Sudden weakness in arms or legs:     Sudden numbness in arms or legs:     Sudden onset of difficulty speaking or slurred speech:    Temporary loss of vision in one eye:     Problems with dizziness:         Gastrointestinal    Blood in stool:     Vomited blood:         Genitourinary    Burning when urinating:     Blood in urine:        Psychiatric    Major depression:         Hematologic    Bleeding problems:    Problems with blood clotting too easily:        Skin    Rashes or ulcers:        Constitutional    Fever or chills:      PHYSICAL EXAM: Vitals:   12/12/18 1347  BP: 125/78  Pulse: 82  Resp: 18  Temp: (!) 97.3 F (36.3 C)  TempSrc: Temporal  SpO2: 97%  Weight: 192 lb (87.1 kg)  Height: 6' (1.829 m)    GENERAL: The patient is a well-nourished male, in no acute distress. The vital signs are documented above. CARDIAC: There is a regular rate and rhythm.  VASCULAR:  2+ palpable femoral pulse bilaterally Right dorsalis pedis palpable No palpable left pedal pulses. One large varicosity left leg lateral and just above ankle Multiple spider veins left lower extremity PULMONARY: There is good air exchange bilaterally without wheezing or rales. ABDOMEN: Soft and non-tender with normal pitched bowel sounds.  MUSCULOSKELETAL: There are no major deformities or cyanosis. NEUROLOGIC: No focal weakness or paresthesias are detected. SKIN: There are no ulcers or rashes noted.   DATA:   Left lower extremity venous reflux study shows elevated reflux times in the left common femoral as well as femoral vein as well as the small saphenous vein  Assessment/Plan:  85 year old male with history of DVT in bilateral lower extremities that presents for left lower extremity varicosity evaluation.  On duplex today he does have evidence of venous  insufficiency in both the deep and superficial venous systems.  His deep reflux is in the left common femoral and femoral vein and is likely from his previous DVT in that location.  Discussed that this is typically managed conservatively with compression and leg elevation and there is no role for surgical intervention.  On the other hand he does have significant reflux in the left small saphenous which is superficial and would potentially be amenable to intervention.  However he has no symptoms including no leg swelling or heaviness or aching etc.  Given hx of multiple DVT's would be high risk for intervention and really no role if asymptomatic.  I have recommended ongoing compression and elevation at this time.  Follow-up PRN.   Marty Heck, MD [facility] Office: [phone] Pager: [phone]

## 2018-12-15 ENCOUNTER — Encounter: Payer: Self-pay | Admitting: Vascular Surgery

## 2019-01-01 DIAGNOSIS — I130 Hypertensive heart and chronic kidney disease with heart failure and stage 1 through stage 4 chronic kidney disease, or unspecified chronic kidney disease: Secondary | ICD-10-CM | POA: Diagnosis not present

## 2019-01-01 DIAGNOSIS — E7849 Other hyperlipidemia: Secondary | ICD-10-CM | POA: Diagnosis not present

## 2019-01-01 DIAGNOSIS — Z125 Encounter for screening for malignant neoplasm of prostate: Secondary | ICD-10-CM | POA: Diagnosis not present

## 2019-01-01 DIAGNOSIS — E1129 Type 2 diabetes mellitus with other diabetic kidney complication: Secondary | ICD-10-CM | POA: Diagnosis not present

## 2019-01-03 DIAGNOSIS — R82998 Other abnormal findings in urine: Secondary | ICD-10-CM | POA: Diagnosis not present

## 2019-01-03 DIAGNOSIS — I10 Essential (primary) hypertension: Secondary | ICD-10-CM | POA: Diagnosis not present

## 2019-01-08 DIAGNOSIS — Z1339 Encounter for screening examination for other mental health and behavioral disorders: Secondary | ICD-10-CM | POA: Diagnosis not present

## 2019-01-08 DIAGNOSIS — N1832 Chronic kidney disease, stage 3b: Secondary | ICD-10-CM | POA: Diagnosis not present

## 2019-01-08 DIAGNOSIS — I83812 Varicose veins of left lower extremities with pain: Secondary | ICD-10-CM | POA: Diagnosis not present

## 2019-01-08 DIAGNOSIS — N401 Enlarged prostate with lower urinary tract symptoms: Secondary | ICD-10-CM | POA: Diagnosis not present

## 2019-01-08 DIAGNOSIS — E1129 Type 2 diabetes mellitus with other diabetic kidney complication: Secondary | ICD-10-CM | POA: Diagnosis not present

## 2019-01-08 DIAGNOSIS — I5042 Chronic combined systolic (congestive) and diastolic (congestive) heart failure: Secondary | ICD-10-CM | POA: Diagnosis not present

## 2019-01-08 DIAGNOSIS — Z794 Long term (current) use of insulin: Secondary | ICD-10-CM | POA: Diagnosis not present

## 2019-01-08 DIAGNOSIS — I351 Nonrheumatic aortic (valve) insufficiency: Secondary | ICD-10-CM | POA: Diagnosis not present

## 2019-01-08 DIAGNOSIS — I2510 Atherosclerotic heart disease of native coronary artery without angina pectoris: Secondary | ICD-10-CM | POA: Diagnosis not present

## 2019-01-08 DIAGNOSIS — Z789 Other specified health status: Secondary | ICD-10-CM | POA: Diagnosis not present

## 2019-01-08 DIAGNOSIS — Z0000 Encounter for general adult medical examination without abnormal findings: Secondary | ICD-10-CM | POA: Diagnosis not present

## 2019-01-08 DIAGNOSIS — I130 Hypertensive heart and chronic kidney disease with heart failure and stage 1 through stage 4 chronic kidney disease, or unspecified chronic kidney disease: Secondary | ICD-10-CM | POA: Diagnosis not present

## 2019-01-30 DIAGNOSIS — D485 Neoplasm of uncertain behavior of skin: Secondary | ICD-10-CM | POA: Diagnosis not present

## 2019-01-30 DIAGNOSIS — L570 Actinic keratosis: Secondary | ICD-10-CM | POA: Diagnosis not present

## 2019-01-30 DIAGNOSIS — Z1212 Encounter for screening for malignant neoplasm of rectum: Secondary | ICD-10-CM | POA: Diagnosis not present

## 2019-01-30 DIAGNOSIS — B079 Viral wart, unspecified: Secondary | ICD-10-CM | POA: Diagnosis not present

## 2019-01-30 DIAGNOSIS — D225 Melanocytic nevi of trunk: Secondary | ICD-10-CM | POA: Diagnosis not present

## 2019-01-30 DIAGNOSIS — C44629 Squamous cell carcinoma of skin of left upper limb, including shoulder: Secondary | ICD-10-CM | POA: Diagnosis not present

## 2019-02-12 DIAGNOSIS — C44629 Squamous cell carcinoma of skin of left upper limb, including shoulder: Secondary | ICD-10-CM | POA: Diagnosis not present

## 2019-02-23 ENCOUNTER — Other Ambulatory Visit: Payer: Self-pay

## 2019-02-23 ENCOUNTER — Encounter: Payer: Self-pay | Admitting: Podiatry

## 2019-02-23 ENCOUNTER — Ambulatory Visit: Payer: PPO | Admitting: Podiatry

## 2019-02-23 DIAGNOSIS — E118 Type 2 diabetes mellitus with unspecified complications: Secondary | ICD-10-CM

## 2019-02-23 DIAGNOSIS — D689 Coagulation defect, unspecified: Secondary | ICD-10-CM | POA: Diagnosis not present

## 2019-02-23 DIAGNOSIS — M79674 Pain in right toe(s): Secondary | ICD-10-CM

## 2019-02-23 DIAGNOSIS — M79675 Pain in left toe(s): Secondary | ICD-10-CM | POA: Diagnosis not present

## 2019-02-23 DIAGNOSIS — B351 Tinea unguium: Secondary | ICD-10-CM | POA: Diagnosis not present

## 2019-02-23 NOTE — Progress Notes (Signed)
Patient ID: Nathan [name], male   DOB: August 21, 1933, 86 y.o.   MRN: [id number] Complaint:  Visit Type: Patient returns to my office for continued preventative foot care services. Complaint: Patient states" my nails have grown long and thick and become painful to walk and wear shoes" Patient has been diagnosed with DM with no foot complications. The patient presents for preventative foot care services. No changes to ROS  Podiatric Exam: Vascular: dorsalis pedis and posterior tibial pulses are palpable bilateral. Capillary return is immediate. Temperature gradient is WNL. Skin turgor WNL  Sensorium: Diminished  Semmes Weinstein monofilament test. Normal tactile sensation bilaterally. Nail Exam: Pt has thick disfigured discolored nails with subungual debris noted bilateral entire nail hallux through fifth toenails Ulcer Exam: There is no evidence of ulcer or pre-ulcerative changes or infection. Orthopedic Exam: Muscle tone and strength are WNL. No limitations in general ROM. No crepitus or effusions noted. Foot type and digits show no abnormalities. Bony prominences are unremarkable.Hammer toes second B/l.  MCJ DJD  B/L. Skin: No Porokeratosis. No infection or ulcers.   Diagnosis:  Onychomycosis, , Pain in right toe, pain in left toes  Treatment & Plan Procedures and Treatment: Consent by patient was obtained for treatment procedures. The patient understood the discussion of treatment and procedures well. All questions were answered thoroughly reviewed. Debridement of mycotic and hypertrophic toenails, 1 through 5 bilateral and clearing of subungual debris. No ulceration, no infection noted.   Return Visit-Office Procedure: Patient instructed to return to the office for a follow up visit 3 months for continued evaluation and treatment.   Gardiner Barefoot DPM

## 2019-03-19 ENCOUNTER — Other Ambulatory Visit: Payer: Self-pay | Admitting: Physician Assistant

## 2019-03-26 ENCOUNTER — Other Ambulatory Visit: Payer: Self-pay

## 2019-03-26 ENCOUNTER — Ambulatory Visit: Payer: PPO | Admitting: Cardiovascular Disease

## 2019-03-26 ENCOUNTER — Encounter: Payer: Self-pay | Admitting: Cardiovascular Disease

## 2019-03-26 VITALS — BP 142/88 | HR 84 | Ht 72.0 in | Wt 196.0 lb

## 2019-03-26 DIAGNOSIS — L820 Inflamed seborrheic keratosis: Secondary | ICD-10-CM | POA: Diagnosis not present

## 2019-03-26 DIAGNOSIS — R208 Other disturbances of skin sensation: Secondary | ICD-10-CM | POA: Diagnosis not present

## 2019-03-26 DIAGNOSIS — I350 Nonrheumatic aortic (valve) stenosis: Secondary | ICD-10-CM | POA: Diagnosis not present

## 2019-03-26 DIAGNOSIS — L905 Scar conditions and fibrosis of skin: Secondary | ICD-10-CM | POA: Diagnosis not present

## 2019-03-26 DIAGNOSIS — Z85828 Personal history of other malignant neoplasm of skin: Secondary | ICD-10-CM | POA: Diagnosis not present

## 2019-03-26 DIAGNOSIS — I25810 Atherosclerosis of coronary artery bypass graft(s) without angina pectoris: Secondary | ICD-10-CM

## 2019-03-26 DIAGNOSIS — D225 Melanocytic nevi of trunk: Secondary | ICD-10-CM | POA: Diagnosis not present

## 2019-03-26 DIAGNOSIS — L570 Actinic keratosis: Secondary | ICD-10-CM | POA: Diagnosis not present

## 2019-03-26 DIAGNOSIS — I10 Essential (primary) hypertension: Secondary | ICD-10-CM

## 2019-03-26 LAB — LIPID PANEL
Chol/HDL Ratio: 5.3 ratio — ABNORMAL HIGH (ref 0.0–5.0)
Cholesterol, Total: 160 mg/dL (ref 100–199)
HDL: 30 mg/dL — ABNORMAL LOW (ref >39)
LDL Chol Calc (NIH): 65 mg/dL (ref 0–99)
Triglycerides: 421 mg/dL — ABNORMAL HIGH (ref 0–149)
VLDL Cholesterol Cal: 65 mg/dL — ABNORMAL HIGH (ref 5–40)

## 2019-03-26 LAB — BASIC METABOLIC PANEL
BUN/Creatinine Ratio: 15 (ref 10–24)
BUN: 28 mg/dL — ABNORMAL HIGH (ref 8–27)
CO2: 22 mmol/L (ref 20–29)
Calcium: 9.4 mg/dL (ref 8.6–10.2)
Chloride: 103 mmol/L (ref 96–106)
Creatinine, Ser: 1.88 mg/dL — ABNORMAL HIGH (ref 0.76–1.27)
GFR calc Af Amer: 37 mL/min/{1.73_m2} — ABNORMAL LOW (ref >59)
GFR calc non Af Amer: 32 mL/min/{1.73_m2} — ABNORMAL LOW (ref >59)
Glucose: 173 mg/dL — ABNORMAL HIGH (ref 65–99)
Potassium: 3.5 mmol/L (ref 3.5–5.2)
Sodium: 142 mmol/L (ref 134–144)

## 2019-03-26 LAB — HEPATIC FUNCTION PANEL
ALT: 17 IU/L (ref 0–44)
AST: 25 IU/L (ref 0–40)
Albumin: 3.8 g/dL (ref 3.6–4.6)
Alkaline Phosphatase: 105 IU/L (ref 39–117)
Bilirubin Total: 0.6 mg/dL (ref 0.0–1.2)
Bilirubin, Direct: 0.19 mg/dL (ref 0.00–0.40)
Total Protein: 6.8 g/dL (ref 6.0–8.5)

## 2019-03-26 NOTE — Progress Notes (Signed)
Nathan [name] Date of Birth  May 13, 1933       [facility]    [employer] [address], [place], [place]  [address]   [place], [place]  [address] [phone]     [phone]   Fax  [phone]    Fax [phone]  Problem List: 1. Coronary artery disease-status post CABG 2. Aortic insufficiency 3. Hypercholesterolemia 4. Diabetes mellitus 5. Mitral regurgitation     Nathan [name] is doing very well. He's not had any episodes of chest pain or shortness of breath. 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  Dec. 2, 2014:  Nathan [name] is doing well.  Not as much energy.   No CP or dyspnea.    Dec. 3,2015:  Doing well from a cardiac standpoint.  started taking insulin recently.   Worked again for [name].   Dec. 2, 2016:  Doing well from a cardiac standpoint.   BP is high today , was normal yesterday   Feb. 14, 2017:   Nathan [name] is seen back for a visit BP is a bit elevated today .   Eats lots of salty foods ( soup for dinner) He had acute cholecystitis and had a perc drain placed.  The drain has been removed.  He never had his GB removed.  Was placed on home O2 during the hospitalization  And has been on home O2 since He does not think he needs it.   He occasionally will forget to turn it on and cannot tell the difference.  Will check his O2 sate on RA. ( time off oxygen  1440)   During his hospitalization, he had an echocardiogram that reveals normal left ventricle systolic function. He did have mild pulmonary hypertension. He had a stress Myoview study which revealed normal left ventricular systolic function and revealed no ischemia.  Aug. 17, 2017:  Doing well. Had the gall bladder percutaneous drain removed. Did not have GB removed.  Stands on his feet all day , has some leg edema   Feb. 13, 2018:  Doing well .   No further gall  bladder issues  Sept.  26, 2018:  Nathan [name] is seen today for follow-up of his coronary artery disease. Also  has a history of essential hypertension.  May 31, 2017 Doing well . Strawberries  Are ready - hes still working  No CP  No further gall bladder issues  August 29, 2017:  Nathan [name] is seen today  Has been having lots of orthostasis  Lightheadness. Has not been working - works at [name] - in [employer] building  Has been having lots of GI issues ( belching and GERD after eating ) , thinks he is having gall bladder issues.  Appetite has not been good  Labs from [name] office show a creatinine of 2.3  Had 4 of his pills stopped last week  Taking fish oil, amlodipine, ASA and potassium ( by mistake, was supposed to stop)  , MVI  Still eating salty meats   Oct. 21, 2019:  Nathan [name] is seen today  Has not had a good appetite for the past several months  Has a left leg DVT - Is now on Eliquis  orthostasis has improved    September 22, 2018:  Doing well.   No CP , no dyspnea.   BP is a little  elevated.   March 26, 2019:  Nathan [name] is seen today for follow-up of his coronary artery disease and aortic insufficiency.  He also has a history of hyperlipidemia. Has retired from [employer].  No CP, Has some dyspnea if he works too quickly .   Current Outpatient Medications on File Prior to Visit  Medication Sig Dispense Refill  . apixaban (ELIQUIS) 2.5 MG TABS tablet Take 2.5 mg by mouth 2 (two) times daily.    [name] atorvastatin (LIPITOR) 40 MG tablet Take 40 mg by mouth daily.  3  . carvedilol (COREG) 6.25 MG tablet Take 6.25 mg by mouth daily.    . furosemide (LASIX) 40 MG tablet Take 1.5 tablets (60 mg total) by mouth daily. 135 tablet 3  . hydrALAZINE (APRESOLINE) 25 MG tablet Take 1 tablet (25 mg total) by mouth 2 (two) times daily. 180 tablet 3  . insulin NPH-regular Human (NOVOLIN 70/30) (70-30) 100 UNIT/ML injection Inject 40-45 Units into the skin See admin instructions.  45 units in the morning, 40 units at dinner    . isosorbide mononitrate (IMDUR) 60 MG 24 hr tablet TAKE 1 TABLET BY MOUTH EVERY DAY 90 tablet 2  . Multiple Vitamins-Minerals (CENTRUM SILVER PO) Take 1 tablet by mouth daily.    . nitroGLYCERIN (NITROSTAT) 0.4 MG SL tablet Place 1 tablet (0.4 mg total) under the tongue every 5 (five) minutes as needed for chest pain. 25 tablet 5  . Omega-3 Fatty Acids (FISH OIL) 1200 MG CAPS Take 1,200 mg by mouth 2 (two) times daily.     [name] omeprazole (PRILOSEC) 20 MG capsule Take 20 mg by mouth 2 (two) times daily.     . potassium chloride (K-DUR) 10 MEQ tablet Take 10 mEq by mouth daily.    [name] RELION INSULIN SYRINGE 1ML/31G 31G X 5/16" 1 ML MISC USE 1 TWICE DAILY AS DIRECTED  6  . Blood Glucose Monitoring Suppl (ONETOUCH VERIO) w/Device KIT     . Lancets (ONETOUCH ULTRASOFT) lancets     . ONETOUCH VERIO test strip      No current facility-administered medications on file prior to visit.    No Known Allergies  Past Medical History:  Diagnosis Date  . Acid reflux   . Aortic insufficiency   . Cholecystitis   . Combined systolic and diastolic heart failure ([place])    Echo 04/2018: inf-lat HK, EF 40-45, severe basal septal hypertrophy, mild conc LVH, Gr 1 DD, severe LAE, mild to mod TR, mod AI, asc Aorta 39 mm  . Coronary artery disease 2002   CABG x5  . Diabetes mellitus   . DVT (deep venous thrombosis) ([facility])    previously on coumadin  . Hypercholesteremia   . Hyperlipidemia   . Mitral valve regurgitation   . Skin cancer    tumor removed off of his back    Past Surgical History:  Procedure Laterality Date  . BILIARY DRAINAGE CATHETER PLACEMENT W/ BILE DUCT TUBE CHANGE  01/2015  . CORONARY ARTERY BYPASS GRAFT  12/21/2000   x5  . LEFT HEART CATH AND CORS/GRAFTS ANGIOGRAPHY N/A 04/06/2018   Procedure: LEFT HEART CATH AND CORS/GRAFTS ANGIOGRAPHY;  Surgeon: Lorretta Harp, MD;  Location: [facility];  Service: Cardiovascular;  Laterality: N/A;   . SKIN CANCER EXCISION    . TUMOR REMOVAL     off of back, skin cancer    Social History   Tobacco Use  Smoking Status Never Smoker  Smokeless Tobacco Never Used  Social History   Substance and Sexual Activity  Alcohol Use No    Family History  Problem Relation Age of Onset  . Heart attack Father   . Hypertension Mother   . Diabetes Brother   . Cancer Sister        breast  . Cancer Brother        throat & mouth    Reviw of Systems:  Noted in current history, otherwise review of systems is negative.   Physical Exam: Physical Exam: Blood pressure (!) 142/88, pulse 84, height 6' (1.829 m), weight 196 lb (88.9 kg).  GEN:  Elderly male,  NAD  HEENT: Normal NECK: No JVD; No carotid bruits LYMPHATICS: No lymphadenopathy CARDIAC: RRR  Soft systolic murmur  RESPIRATORY:  Clear to auscultation without rales, wheezing or rhonchi  ABDOMEN: Soft, non-tender, non-distended MUSCULOSKELETAL:  No edema; No deformity  SKIN: Warm and dry NEUROLOGIC:  Alert and oriented x 3   ECG:    March 26, 2019: Normal sinus rhythm at 84.  First-degree AV block.  Right bundle branch block with associated ST and T wave changes.  Assessment / Plan:   1. Coronary artery disease-      No angina . Cont atorvastatin .   Check labs today   2.  Chronic combined systolic / diastolic CHF:   On coreg.    Overall doing well.    Is failrly active.   3.  Left leg DVT :   Cont eliquis .  2. Aortic insufficiency -  Stable   3. Hypercholesterolemia-     Check lipids, liver, bmp today     Return in 6 months   [name], MD  03/26/2019 8:58 AM    [address] [place],  [place], [place]  [address] Pager [phone] Phone: [phone]; Fax: [phone]

## 2019-03-26 NOTE — Patient Instructions (Addendum)
Medication Instructions:  *If you need a refill on your cardiac medications before your next appointment, please call your pharmacy*  Lab Work: Your physician recommends that you have lab work today. BMET, lipid and liver panel.  If you have labs (blood work) drawn today and your tests are completely normal, you will receive your results only by: [name] MyChart Message (if you have MyChart) OR . A paper copy in the mail If you have any lab test that is abnormal or we need to change your treatment, we will call you to review the results.  Follow-Up: At [facility], you and your health needs are our priority.  As part of our continuing mission to provide you with exceptional heart care, we have created designated Provider Care Teams.  These Care Teams include your primary Cardiologist (physician) and Advanced Practice Providers (APPs -  Physician Assistants and Nurse Practitioners) who all work together to provide you with the care you need, when you need it.  Your next appointment:   1 year(s)  The format for your next appointment:   In Person  Provider:   You may see [name], MD or one of the following Advanced Practice Providers on your designated Care Team:    Richardson Dopp, PA-C  [place], [place]  Daune Perch, [place]

## 2019-03-27 ENCOUNTER — Telehealth: Payer: Self-pay | Admitting: Nurse Practitioner

## 2019-03-27 DIAGNOSIS — E781 Pure hyperglyceridemia: Secondary | ICD-10-CM

## 2019-03-27 DIAGNOSIS — I25810 Atherosclerosis of coronary artery bypass graft(s) without angina pectoris: Secondary | ICD-10-CM

## 2019-03-27 DIAGNOSIS — E7800 Pure hypercholesterolemia, unspecified: Secondary | ICD-10-CM

## 2019-03-27 MED ORDER — FENOFIBRATE 145 MG PO TABS: 145.0000 mg | ORAL_TABLET | Freq: Every day | ORAL | 11 refills | Status: DC

## 2019-03-27 NOTE — Telephone Encounter (Signed)
Reviewed lab results and plan of care with patient. He admits to eating pancakes daily, and eating cake and ice cream on most days. He agrees to reduce intake of these foods and agrees to start fenofibrate 145 mg daily.  He is scheduled for repeat lab appointment on 5/11. I advised him to call back sooner with questions or concerns. He thanked me for the call.

## 2019-03-27 NOTE — Telephone Encounter (Signed)
-----   Message from Thayer Headings, MD sent at 03/27/2019 11:02 AM EST ----- Liver enz are good BMP is stable

## 2019-04-14 ENCOUNTER — Other Ambulatory Visit: Payer: Self-pay | Admitting: Physician Assistant

## 2019-05-08 DIAGNOSIS — I10 Essential (primary) hypertension: Secondary | ICD-10-CM | POA: Diagnosis not present

## 2019-05-08 DIAGNOSIS — I25810 Atherosclerosis of coronary artery bypass graft(s) without angina pectoris: Secondary | ICD-10-CM | POA: Diagnosis not present

## 2019-05-08 DIAGNOSIS — Z789 Other specified health status: Secondary | ICD-10-CM | POA: Diagnosis not present

## 2019-05-08 DIAGNOSIS — E1129 Type 2 diabetes mellitus with other diabetic kidney complication: Secondary | ICD-10-CM | POA: Diagnosis not present

## 2019-05-08 DIAGNOSIS — Z1331 Encounter for screening for depression: Secondary | ICD-10-CM | POA: Diagnosis not present

## 2019-05-08 DIAGNOSIS — N1832 Chronic kidney disease, stage 3b: Secondary | ICD-10-CM | POA: Diagnosis not present

## 2019-05-08 DIAGNOSIS — M545 Low back pain: Secondary | ICD-10-CM | POA: Diagnosis not present

## 2019-05-08 DIAGNOSIS — I5042 Chronic combined systolic (congestive) and diastolic (congestive) heart failure: Secondary | ICD-10-CM | POA: Diagnosis not present

## 2019-05-08 DIAGNOSIS — Z794 Long term (current) use of insulin: Secondary | ICD-10-CM | POA: Diagnosis not present

## 2019-05-08 DIAGNOSIS — I351 Nonrheumatic aortic (valve) insufficiency: Secondary | ICD-10-CM | POA: Diagnosis not present

## 2019-05-14 ENCOUNTER — Other Ambulatory Visit: Payer: Self-pay | Admitting: Cardiovascular Disease

## 2019-05-25 ENCOUNTER — Encounter: Payer: Self-pay | Admitting: Podiatry

## 2019-05-25 ENCOUNTER — Other Ambulatory Visit: Payer: Self-pay

## 2019-05-25 ENCOUNTER — Ambulatory Visit: Payer: PPO | Admitting: Podiatry

## 2019-05-25 VITALS — Temp 97.3°F

## 2019-05-25 DIAGNOSIS — M79675 Pain in left toe(s): Secondary | ICD-10-CM

## 2019-05-25 DIAGNOSIS — E118 Type 2 diabetes mellitus with unspecified complications: Secondary | ICD-10-CM

## 2019-05-25 DIAGNOSIS — M79674 Pain in right toe(s): Secondary | ICD-10-CM | POA: Diagnosis not present

## 2019-05-25 DIAGNOSIS — D689 Coagulation defect, unspecified: Secondary | ICD-10-CM

## 2019-05-25 DIAGNOSIS — B351 Tinea unguium: Secondary | ICD-10-CM | POA: Diagnosis not present

## 2019-05-25 NOTE — Progress Notes (Signed)
This patient returns to my office for at risk foot care.  This patient requires this care by a professional since this patient will be at risk due to having diabetes type 2, CKD and chronic insufficiensy.  Patient is taking eliquiss. This patient is unable to cut nails himself since the patient cannot reach his nails.These nails are painful walking and wearing shoes.  This patient presents for at risk foot care today.  General Appearance  Alert, conversant and in no acute stress.  Vascular  Dorsalis pedis and posterior tibial  pulses are palpable  bilaterally.  Capillary return is within normal limits  bilaterally. Temperature is within normal limits  bilaterally.  Neurologic  Senn-Weinstein monofilament wire test diminished   bilaterally. Muscle power within normal limits bilaterally.  Nails Thick disfigured discolored nails with subungual debris  from hallux to fifth toes bilaterally. No evidence of bacterial infection or drainage bilaterally.  Orthopedic  No limitations of motion  feet .  No crepitus or effusions noted.  Hammer toes second  B/L.  MCJ DJD  B/L.  Skin  normotropic skin with no porokeratosis noted bilaterally.  No signs of infections or ulcers noted.     Onychomycosis  Pain in right toes  Pain in left toes  Consent was obtained for treatment procedures.   Mechanical debridement of nails 1-5  bilaterally performed with a nail nipper.  Filed with dremel without incident.    Return office visit  3 months                   Told patient to return for periodic foot care and evaluation due to potential at risk complications.   Gardiner Barefoot DPM

## 2019-06-26 ENCOUNTER — Other Ambulatory Visit: Payer: PPO | Admitting: *Deleted

## 2019-06-26 ENCOUNTER — Other Ambulatory Visit: Payer: Self-pay

## 2019-06-26 ENCOUNTER — Telehealth: Payer: Self-pay | Admitting: Nurse Practitioner

## 2019-06-26 DIAGNOSIS — E7800 Pure hypercholesterolemia, unspecified: Secondary | ICD-10-CM | POA: Diagnosis not present

## 2019-06-26 DIAGNOSIS — I25810 Atherosclerosis of coronary artery bypass graft(s) without angina pectoris: Secondary | ICD-10-CM | POA: Diagnosis not present

## 2019-06-26 DIAGNOSIS — N179 Acute kidney failure, unspecified: Secondary | ICD-10-CM

## 2019-06-26 DIAGNOSIS — I10 Essential (primary) hypertension: Secondary | ICD-10-CM

## 2019-06-26 DIAGNOSIS — E781 Pure hyperglyceridemia: Secondary | ICD-10-CM | POA: Diagnosis not present

## 2019-06-26 LAB — HEPATIC FUNCTION PANEL
ALT: 24 IU/L (ref 0–44)
AST: 36 IU/L (ref 0–40)
Albumin: 4 g/dL (ref 3.6–4.6)
Alkaline Phosphatase: 50 IU/L (ref 39–117)
Bilirubin Total: 0.6 mg/dL (ref 0.0–1.2)
Bilirubin, Direct: 0.24 mg/dL (ref 0.00–0.40)
Total Protein: 7 g/dL (ref 6.0–8.5)

## 2019-06-26 LAB — LIPID PANEL
Chol/HDL Ratio: 3.5 ratio (ref 0.0–5.0)
Cholesterol, Total: 134 mg/dL (ref 100–199)
HDL: 38 mg/dL — ABNORMAL LOW (ref >39)
LDL Chol Calc (NIH): 69 mg/dL (ref 0–99)
Triglycerides: 153 mg/dL — ABNORMAL HIGH (ref 0–149)
VLDL Cholesterol Cal: 27 mg/dL (ref 5–40)

## 2019-06-26 LAB — BASIC METABOLIC PANEL
BUN/Creatinine Ratio: 17 (ref 10–24)
BUN: 51 mg/dL — ABNORMAL HIGH (ref 8–27)
CO2: 20 mmol/L (ref 20–29)
Calcium: 9.2 mg/dL (ref 8.6–10.2)
Chloride: 105 mmol/L (ref 96–106)
Creatinine, Ser: 2.93 mg/dL — ABNORMAL HIGH (ref 0.76–1.27)
GFR calc Af Amer: 22 mL/min/{1.73_m2} — ABNORMAL LOW (ref >59)
GFR calc non Af Amer: 19 mL/min/{1.73_m2} — ABNORMAL LOW (ref >59)
Glucose: 100 mg/dL — ABNORMAL HIGH (ref 65–99)
Potassium: 4 mmol/L (ref 3.5–5.2)
Sodium: 142 mmol/L (ref 134–144)

## 2019-06-26 NOTE — Telephone Encounter (Signed)
Dr. Acie Fredrickson called patient and discussed results. He asked patient questions about recent medication changes and patient states he has not started any new medications since starting fenofibrate in February. Patient denies dehydration.  Dr. Acie Fredrickson advised him to hold lasix and potassium until further notice. Patient will return for repeat BMET in 1 week and was advised to call sooner with questions or concerns.

## 2019-06-26 NOTE — Telephone Encounter (Signed)
-----   Message from Thayer Headings, MD sent at 06/26/2019  5:13 PM EDT ----- Creatinine and bun are elevted.  DC lasix and potassium  Recheck in 1 week   Liver enz look good.  Lipids are pending

## 2019-07-03 ENCOUNTER — Other Ambulatory Visit: Payer: PPO

## 2019-07-03 ENCOUNTER — Other Ambulatory Visit: Payer: Self-pay

## 2019-07-03 DIAGNOSIS — I10 Essential (primary) hypertension: Secondary | ICD-10-CM | POA: Diagnosis not present

## 2019-07-03 DIAGNOSIS — N179 Acute kidney failure, unspecified: Secondary | ICD-10-CM

## 2019-07-03 LAB — BASIC METABOLIC PANEL
BUN/Creatinine Ratio: 16 (ref 10–24)
BUN: 37 mg/dL — ABNORMAL HIGH (ref 8–27)
CO2: 18 mmol/L — ABNORMAL LOW (ref 20–29)
Calcium: 8.6 mg/dL (ref 8.6–10.2)
Chloride: 112 mmol/L — ABNORMAL HIGH (ref 96–106)
Creatinine, Ser: 2.38 mg/dL — ABNORMAL HIGH (ref 0.76–1.27)
GFR calc Af Amer: 28 mL/min/{1.73_m2} — ABNORMAL LOW (ref >59)
GFR calc non Af Amer: 24 mL/min/{1.73_m2} — ABNORMAL LOW (ref >59)
Glucose: 75 mg/dL (ref 65–99)
Potassium: 4.3 mmol/L (ref 3.5–5.2)
Sodium: 142 mmol/L (ref 134–144)

## 2019-07-04 ENCOUNTER — Other Ambulatory Visit: Payer: Self-pay

## 2019-07-04 ENCOUNTER — Telehealth: Payer: Self-pay

## 2019-07-04 NOTE — Progress Notes (Signed)
The patient has been notified of the result and verbalized understanding.  All questions (if any) were answered. Wilma Flavin, RN 07/04/2019 9:02 AM

## 2019-07-04 NOTE — Telephone Encounter (Signed)
Pt made aware of the following recommendations by Dr. Acie Fredrickson. Pt verbalized understanding.    Creatinine and BUN has improved but are still a bit higher than prior to starting his increased meds for CHF. In addition, he is having some dizziness  Will DC furosemide and potassium ( he has been holding them already for a week)  Reduce Imdur to 30 mg a day  .

## 2019-07-04 NOTE — Telephone Encounter (Signed)
-----   Message from Thayer Headings, MD sent at 07/03/2019  6:16 PM EDT ----- Creatinine and BUN has improved but are still a bit higher than prior to starting his increased meds for  CHF.  In addition, he is having some dizziness Will DC furosemide and potassium ( he has been holding them already for a week) Reduce Imdur to 30 mg a day  Please call patient mid morning and go over these changes.

## 2019-07-10 ENCOUNTER — Other Ambulatory Visit: Payer: Self-pay | Admitting: Nurse Practitioner

## 2019-07-10 MED ORDER — ISOSORBIDE MONONITRATE ER 30 MG PO TB24: 30.0000 mg | ORAL_TABLET | Freq: Every day | ORAL | 3 refills | Status: DC

## 2019-07-16 ENCOUNTER — Other Ambulatory Visit: Payer: Self-pay | Admitting: Cardiovascular Disease

## 2019-08-24 ENCOUNTER — Encounter: Payer: Self-pay | Admitting: Podiatry

## 2019-08-24 ENCOUNTER — Other Ambulatory Visit: Payer: Self-pay

## 2019-08-24 ENCOUNTER — Ambulatory Visit: Payer: PPO | Admitting: Podiatry

## 2019-08-24 DIAGNOSIS — M79674 Pain in right toe(s): Secondary | ICD-10-CM

## 2019-08-24 DIAGNOSIS — B351 Tinea unguium: Secondary | ICD-10-CM

## 2019-08-24 DIAGNOSIS — M2041 Other hammer toe(s) (acquired), right foot: Secondary | ICD-10-CM

## 2019-08-24 DIAGNOSIS — E118 Type 2 diabetes mellitus with unspecified complications: Secondary | ICD-10-CM | POA: Diagnosis not present

## 2019-08-24 DIAGNOSIS — D689 Coagulation defect, unspecified: Secondary | ICD-10-CM

## 2019-08-24 DIAGNOSIS — M2042 Other hammer toe(s) (acquired), left foot: Secondary | ICD-10-CM | POA: Diagnosis not present

## 2019-08-24 DIAGNOSIS — M79675 Pain in left toe(s): Secondary | ICD-10-CM | POA: Diagnosis not present

## 2019-08-24 NOTE — Progress Notes (Signed)
This patient returns to my office for at risk foot care.  This patient requires this care by a professional since this patient will be at risk due to having diabetes type 2, CKD and chronic insufficiensy.  Patient is taking eliquiss.  This patient is unable to cut nails himself since the patient cannot reach his nails.These nails are painful walking and wearing shoes.  This patient presents for at risk foot care today.  General Appearance  Alert, conversant and in no acute stress.  Vascular  Dorsalis pedis and posterior tibial  pulses are palpable  bilaterally.  Capillary return is within normal limits  bilaterally. Temperature is within normal limits  bilaterally.  Neurologic  Senn-Weinstein monofilament wire test diminished   bilaterally. Muscle power within normal limits bilaterally.  Nails Thick disfigured discolored nails with subungual debris  from hallux to fifth toes bilaterally. No evidence of bacterial infection or drainage bilaterally.  Orthopedic  No limitations of motion  feet .  No crepitus or effusions noted.  Hammer toes second  B/L.  MCJ DJD  B/L. HAV  B/L.  Skin  normotropic skin with no porokeratosis noted bilaterally.  No signs of infections or ulcers noted.     Onychomycosis  Pain in right toes  Pain in left toes  Consent was obtained for treatment procedures.   Mechanical debridement of nails 1-5  bilaterally performed with a nail nipper.  Filed with dremel without incident. Patient qualifies for diabetic shoes due to DPN ,HAV,  Hammer toes and DJD.  Patient to make an appointment with Liliane Channel.   Return office visit  3 months                   Told patient to return for periodic foot care and evaluation due to potential at risk complications.   Gardiner Barefoot DPM

## 2019-08-28 DIAGNOSIS — I130 Hypertensive heart and chronic kidney disease with heart failure and stage 1 through stage 4 chronic kidney disease, or unspecified chronic kidney disease: Secondary | ICD-10-CM | POA: Diagnosis not present

## 2019-08-28 DIAGNOSIS — E1129 Type 2 diabetes mellitus with other diabetic kidney complication: Secondary | ICD-10-CM | POA: Diagnosis not present

## 2019-08-28 DIAGNOSIS — R05 Cough: Secondary | ICD-10-CM | POA: Diagnosis not present

## 2019-08-28 DIAGNOSIS — N1832 Chronic kidney disease, stage 3b: Secondary | ICD-10-CM | POA: Diagnosis not present

## 2019-08-28 DIAGNOSIS — I5042 Chronic combined systolic (congestive) and diastolic (congestive) heart failure: Secondary | ICD-10-CM | POA: Diagnosis not present

## 2019-08-28 DIAGNOSIS — J069 Acute upper respiratory infection, unspecified: Secondary | ICD-10-CM | POA: Diagnosis not present

## 2019-08-28 DIAGNOSIS — I25810 Atherosclerosis of coronary artery bypass graft(s) without angina pectoris: Secondary | ICD-10-CM | POA: Diagnosis not present

## 2019-09-05 DIAGNOSIS — I5042 Chronic combined systolic (congestive) and diastolic (congestive) heart failure: Secondary | ICD-10-CM | POA: Diagnosis not present

## 2019-09-05 DIAGNOSIS — I130 Hypertensive heart and chronic kidney disease with heart failure and stage 1 through stage 4 chronic kidney disease, or unspecified chronic kidney disease: Secondary | ICD-10-CM | POA: Diagnosis not present

## 2019-09-05 DIAGNOSIS — R05 Cough: Secondary | ICD-10-CM | POA: Diagnosis not present

## 2019-09-05 DIAGNOSIS — N1832 Chronic kidney disease, stage 3b: Secondary | ICD-10-CM | POA: Diagnosis not present

## 2019-09-13 DIAGNOSIS — H524 Presbyopia: Secondary | ICD-10-CM | POA: Diagnosis not present

## 2019-09-13 DIAGNOSIS — H52223 Regular astigmatism, bilateral: Secondary | ICD-10-CM | POA: Diagnosis not present

## 2019-09-13 DIAGNOSIS — Z9849 Cataract extraction status, unspecified eye: Secondary | ICD-10-CM | POA: Diagnosis not present

## 2019-09-13 DIAGNOSIS — E119 Type 2 diabetes mellitus without complications: Secondary | ICD-10-CM | POA: Diagnosis not present

## 2019-09-13 DIAGNOSIS — Z961 Presence of intraocular lens: Secondary | ICD-10-CM | POA: Diagnosis not present

## 2019-09-13 DIAGNOSIS — H5203 Hypermetropia, bilateral: Secondary | ICD-10-CM | POA: Diagnosis not present

## 2019-09-14 ENCOUNTER — Other Ambulatory Visit: Payer: PPO | Admitting: Orthotics

## 2019-09-14 ENCOUNTER — Other Ambulatory Visit: Payer: Self-pay

## 2019-09-17 DIAGNOSIS — E1121 Type 2 diabetes mellitus with diabetic nephropathy: Secondary | ICD-10-CM | POA: Diagnosis not present

## 2019-09-17 DIAGNOSIS — I130 Hypertensive heart and chronic kidney disease with heart failure and stage 1 through stage 4 chronic kidney disease, or unspecified chronic kidney disease: Secondary | ICD-10-CM | POA: Diagnosis not present

## 2019-09-17 DIAGNOSIS — N1832 Chronic kidney disease, stage 3b: Secondary | ICD-10-CM | POA: Diagnosis not present

## 2019-09-17 DIAGNOSIS — M545 Low back pain: Secondary | ICD-10-CM | POA: Diagnosis not present

## 2019-09-17 DIAGNOSIS — R0600 Dyspnea, unspecified: Secondary | ICD-10-CM | POA: Diagnosis not present

## 2019-09-17 DIAGNOSIS — Z794 Long term (current) use of insulin: Secondary | ICD-10-CM | POA: Diagnosis not present

## 2019-09-17 DIAGNOSIS — I351 Nonrheumatic aortic (valve) insufficiency: Secondary | ICD-10-CM | POA: Diagnosis not present

## 2019-09-17 DIAGNOSIS — I25810 Atherosclerosis of coronary artery bypass graft(s) without angina pectoris: Secondary | ICD-10-CM | POA: Diagnosis not present

## 2019-09-25 DIAGNOSIS — L905 Scar conditions and fibrosis of skin: Secondary | ICD-10-CM | POA: Diagnosis not present

## 2019-09-25 DIAGNOSIS — Z85828 Personal history of other malignant neoplasm of skin: Secondary | ICD-10-CM | POA: Diagnosis not present

## 2019-09-25 DIAGNOSIS — L821 Other seborrheic keratosis: Secondary | ICD-10-CM | POA: Diagnosis not present

## 2019-09-25 DIAGNOSIS — D1801 Hemangioma of skin and subcutaneous tissue: Secondary | ICD-10-CM | POA: Diagnosis not present

## 2019-09-25 DIAGNOSIS — D225 Melanocytic nevi of trunk: Secondary | ICD-10-CM | POA: Diagnosis not present

## 2019-09-25 DIAGNOSIS — L814 Other melanin hyperpigmentation: Secondary | ICD-10-CM | POA: Diagnosis not present

## 2019-09-25 DIAGNOSIS — L570 Actinic keratosis: Secondary | ICD-10-CM | POA: Diagnosis not present

## 2019-09-26 DIAGNOSIS — H43812 Vitreous degeneration, left eye: Secondary | ICD-10-CM | POA: Diagnosis not present

## 2019-09-26 DIAGNOSIS — H43392 Other vitreous opacities, left eye: Secondary | ICD-10-CM | POA: Diagnosis not present

## 2019-11-08 ENCOUNTER — Telehealth: Payer: Self-pay | Admitting: Podiatry

## 2019-11-08 NOTE — Telephone Encounter (Signed)
Pt left message yesterday @ 155pm checking status of diabetic shoes.  Returned call and they are back ordered until mid oct as of now. I told pt I did schedule him to pick them up at his appt 10.22 and will call if we get them in sooner.

## 2019-11-13 ENCOUNTER — Telehealth: Payer: Self-pay | Admitting: Unknown Physician Specialty

## 2019-11-13 DIAGNOSIS — N1832 Chronic kidney disease, stage 3b: Secondary | ICD-10-CM | POA: Diagnosis not present

## 2019-11-13 DIAGNOSIS — Z1152 Encounter for screening for COVID-19: Secondary | ICD-10-CM | POA: Diagnosis not present

## 2019-11-13 DIAGNOSIS — I25810 Atherosclerosis of coronary artery bypass graft(s) without angina pectoris: Secondary | ICD-10-CM | POA: Diagnosis not present

## 2019-11-13 DIAGNOSIS — U071 COVID-19: Secondary | ICD-10-CM | POA: Diagnosis not present

## 2019-11-13 DIAGNOSIS — I130 Hypertensive heart and chronic kidney disease with heart failure and stage 1 through stage 4 chronic kidney disease, or unspecified chronic kidney disease: Secondary | ICD-10-CM | POA: Diagnosis not present

## 2019-11-13 DIAGNOSIS — R05 Cough: Secondary | ICD-10-CM | POA: Diagnosis not present

## 2019-11-13 DIAGNOSIS — I5042 Chronic combined systolic (congestive) and diastolic (congestive) heart failure: Secondary | ICD-10-CM | POA: Diagnosis not present

## 2019-11-13 NOTE — Telephone Encounter (Signed)
Called to Discuss with patient about Covid symptoms and the use of the monoclonal antibody infusion for those with mild to moderate Covid symptoms and at a high risk of hospitalization.     Pt is qualified for this infusion due to co-morbid conditions and/or a member of an at-risk group.     Patient Active Problem List   Diagnosis Date Noted  . Aortic stenosis 03/26/2019  . Chronic venous insufficiency 12/12/2018  . Pain due to onychomycosis of toenails of both feet 08/11/2018  . Coagulation disorder ([place]) 08/11/2018  . Chronic combined systolic and diastolic CHF (congestive heart failure) ([place]) 05/11/2018  . CKD (chronic kidney disease) stage 3, GFR 30-59 ml/min 05/11/2018  . Essential hypertension 11/11/2016  . Pneumonia 01/23/2015  . Hypoxia 01/22/2015  . Elevated troponin 01/22/2015  . Acute cholecystitis 01/21/2015  . S/P CABG (coronary artery bypass graft) 01/21/2015  . Diabetes mellitus type 2 with complications ([place]) 31/43/8887  . Sepsis ([place])   . Valvular heart disease 09/29/2011  . Syncope 01/21/2011  . Coronary artery disease   . Aortic insufficiency   . Hypercholesteremia   . Mitral valve regurgitation     However, pt is fully vaccinated and today is "feeling a whole lot better."  Will call back tomorrow if trajectory does not continue to improve  Patient advised to call back if he/she decides that he/she does want to get infusion. Callback number to the infusion center given. Patient advised to go to Urgent care or ED with severe symptoms.

## 2019-12-07 ENCOUNTER — Ambulatory Visit: Payer: PPO | Admitting: Podiatry

## 2019-12-07 ENCOUNTER — Other Ambulatory Visit: Payer: Self-pay

## 2019-12-07 ENCOUNTER — Encounter: Payer: Self-pay | Admitting: Podiatry

## 2019-12-07 DIAGNOSIS — D689 Coagulation defect, unspecified: Secondary | ICD-10-CM

## 2019-12-07 DIAGNOSIS — M79674 Pain in right toe(s): Secondary | ICD-10-CM | POA: Diagnosis not present

## 2019-12-07 DIAGNOSIS — M2041 Other hammer toe(s) (acquired), right foot: Secondary | ICD-10-CM | POA: Diagnosis not present

## 2019-12-07 DIAGNOSIS — M79675 Pain in left toe(s): Secondary | ICD-10-CM | POA: Diagnosis not present

## 2019-12-07 DIAGNOSIS — E118 Type 2 diabetes mellitus with unspecified complications: Secondary | ICD-10-CM

## 2019-12-07 DIAGNOSIS — B351 Tinea unguium: Secondary | ICD-10-CM

## 2019-12-07 DIAGNOSIS — M2042 Other hammer toe(s) (acquired), left foot: Secondary | ICD-10-CM

## 2019-12-07 NOTE — Progress Notes (Signed)
This patient returns to my office for at risk foot care.  This patient requires this care by a professional since this patient will be at risk due to having diabetes type 2, CKD and chronic insufficiensy.  Patient is taking eliquiss.  This patient is unable to cut nails himself since the patient cannot reach his nails.These nails are painful walking and wearing shoes.  This patient presents for at risk foot care today.  General Appearance  Alert, conversant and in no acute stress.  Vascular  Dorsalis pedis and posterior tibial  pulses are palpable  bilaterally.  Capillary return is within normal limits  bilaterally. Temperature is within normal limits  bilaterally.  Neurologic  Senn-Weinstein monofilament wire test diminished   bilaterally. Muscle power within normal limits bilaterally.  Nails Thick disfigured discolored nails with subungual debris  from hallux to fifth toes bilaterally. No evidence of bacterial infection or drainage bilaterally.  Orthopedic  No limitations of motion  feet .  No crepitus or effusions noted.  Hammer toes second  B/L.  MCJ DJD  B/L. HAV  B/L.  Skin  normotropic skin with no porokeratosis noted bilaterally.  No signs of infections or ulcers noted.     Onychomycosis  Pain in right toes  Pain in left toes  Consent was obtained for treatment procedures.   Mechanical debridement of nails 1-5  bilaterally performed with a nail nipper.  Filed with dremel without incident. Patient qualifies for diabetic shoes due to DPN ,HAV,  Hammer toes and DJD.  Patient to make an appointment with Liliane Channel.   Return office visit  3 months                   Told patient to return for periodic foot care and evaluation due to potential at risk complications.   Gardiner Barefoot DPM

## 2020-01-02 ENCOUNTER — Telehealth: Payer: Self-pay | Admitting: Podiatry

## 2020-01-02 NOTE — Telephone Encounter (Signed)
t left message upset about diabetic shoes not in yet and the yr is almost over.   I returned call and left message for pt that I have contacted the company that makes them to get an updated status just waiting to hear back and I will let him know when I get the update.

## 2020-01-04 ENCOUNTER — Telehealth: Payer: Self-pay | Admitting: Podiatry

## 2020-01-04 NOTE — Telephone Encounter (Signed)
Left message for pt that the shoes and inserts should be shipping next week and I would call him when they come in to schedule an appt to pick them up.

## 2020-01-15 DIAGNOSIS — Z125 Encounter for screening for malignant neoplasm of prostate: Secondary | ICD-10-CM | POA: Diagnosis not present

## 2020-01-15 DIAGNOSIS — E1121 Type 2 diabetes mellitus with diabetic nephropathy: Secondary | ICD-10-CM | POA: Diagnosis not present

## 2020-01-15 DIAGNOSIS — I10 Essential (primary) hypertension: Secondary | ICD-10-CM | POA: Diagnosis not present

## 2020-01-22 ENCOUNTER — Ambulatory Visit (INDEPENDENT_AMBULATORY_CARE_PROVIDER_SITE_OTHER): Payer: PPO | Admitting: Orthotics

## 2020-01-22 ENCOUNTER — Other Ambulatory Visit: Payer: Self-pay

## 2020-01-22 DIAGNOSIS — M2041 Other hammer toe(s) (acquired), right foot: Secondary | ICD-10-CM

## 2020-01-22 DIAGNOSIS — E118 Type 2 diabetes mellitus with unspecified complications: Secondary | ICD-10-CM

## 2020-01-22 DIAGNOSIS — M2042 Other hammer toe(s) (acquired), left foot: Secondary | ICD-10-CM | POA: Diagnosis not present

## 2020-01-22 DIAGNOSIS — M79674 Pain in right toe(s): Secondary | ICD-10-CM

## 2020-01-22 DIAGNOSIS — B351 Tinea unguium: Secondary | ICD-10-CM

## 2020-01-22 DIAGNOSIS — M79675 Pain in left toe(s): Secondary | ICD-10-CM

## 2020-01-22 DIAGNOSIS — D689 Coagulation defect, unspecified: Secondary | ICD-10-CM

## 2020-01-25 DIAGNOSIS — L570 Actinic keratosis: Secondary | ICD-10-CM | POA: Diagnosis not present

## 2020-01-25 DIAGNOSIS — L578 Other skin changes due to chronic exposure to nonionizing radiation: Secondary | ICD-10-CM | POA: Diagnosis not present

## 2020-01-30 DIAGNOSIS — I872 Venous insufficiency (chronic) (peripheral): Secondary | ICD-10-CM | POA: Diagnosis not present

## 2020-01-30 DIAGNOSIS — Z1331 Encounter for screening for depression: Secondary | ICD-10-CM | POA: Diagnosis not present

## 2020-01-30 DIAGNOSIS — I700 Atherosclerosis of aorta: Secondary | ICD-10-CM | POA: Diagnosis not present

## 2020-01-30 DIAGNOSIS — I351 Nonrheumatic aortic (valve) insufficiency: Secondary | ICD-10-CM | POA: Diagnosis not present

## 2020-01-30 DIAGNOSIS — I10 Essential (primary) hypertension: Secondary | ICD-10-CM | POA: Diagnosis not present

## 2020-01-30 DIAGNOSIS — Z0000 Encounter for general adult medical examination without abnormal findings: Secondary | ICD-10-CM | POA: Diagnosis not present

## 2020-01-30 DIAGNOSIS — R0600 Dyspnea, unspecified: Secondary | ICD-10-CM | POA: Diagnosis not present

## 2020-01-30 DIAGNOSIS — E1121 Type 2 diabetes mellitus with diabetic nephropathy: Secondary | ICD-10-CM | POA: Diagnosis not present

## 2020-01-30 DIAGNOSIS — N184 Chronic kidney disease, stage 4 (severe): Secondary | ICD-10-CM | POA: Diagnosis not present

## 2020-01-30 DIAGNOSIS — I82402 Acute embolism and thrombosis of unspecified deep veins of left lower extremity: Secondary | ICD-10-CM | POA: Diagnosis not present

## 2020-01-30 DIAGNOSIS — R82998 Other abnormal findings in urine: Secondary | ICD-10-CM | POA: Diagnosis not present

## 2020-01-30 DIAGNOSIS — I5042 Chronic combined systolic (congestive) and diastolic (congestive) heart failure: Secondary | ICD-10-CM | POA: Diagnosis not present

## 2020-01-30 DIAGNOSIS — Z794 Long term (current) use of insulin: Secondary | ICD-10-CM | POA: Diagnosis not present

## 2020-01-30 DIAGNOSIS — I25810 Atherosclerosis of coronary artery bypass graft(s) without angina pectoris: Secondary | ICD-10-CM | POA: Diagnosis not present

## 2020-02-18 ENCOUNTER — Other Ambulatory Visit: Payer: Self-pay | Admitting: Cardiovascular Disease

## 2020-03-11 ENCOUNTER — Other Ambulatory Visit (HOSPITAL_COMMUNITY): Payer: Self-pay | Admitting: Nephrology

## 2020-03-11 ENCOUNTER — Ambulatory Visit: Payer: PPO | Admitting: Podiatry

## 2020-03-11 ENCOUNTER — Other Ambulatory Visit: Payer: Self-pay | Admitting: Nephrology

## 2020-03-11 ENCOUNTER — Encounter: Payer: Self-pay | Admitting: Podiatry

## 2020-03-11 ENCOUNTER — Other Ambulatory Visit: Payer: Self-pay

## 2020-03-11 DIAGNOSIS — M79674 Pain in right toe(s): Secondary | ICD-10-CM

## 2020-03-11 DIAGNOSIS — M79675 Pain in left toe(s): Secondary | ICD-10-CM

## 2020-03-11 DIAGNOSIS — E1122 Type 2 diabetes mellitus with diabetic chronic kidney disease: Secondary | ICD-10-CM | POA: Diagnosis not present

## 2020-03-11 DIAGNOSIS — D689 Coagulation defect, unspecified: Secondary | ICD-10-CM | POA: Diagnosis not present

## 2020-03-11 DIAGNOSIS — R809 Proteinuria, unspecified: Secondary | ICD-10-CM | POA: Diagnosis not present

## 2020-03-11 DIAGNOSIS — N184 Chronic kidney disease, stage 4 (severe): Secondary | ICD-10-CM | POA: Diagnosis not present

## 2020-03-11 DIAGNOSIS — E118 Type 2 diabetes mellitus with unspecified complications: Secondary | ICD-10-CM

## 2020-03-11 DIAGNOSIS — B351 Tinea unguium: Secondary | ICD-10-CM | POA: Diagnosis not present

## 2020-03-11 DIAGNOSIS — N400 Enlarged prostate without lower urinary tract symptoms: Secondary | ICD-10-CM | POA: Diagnosis not present

## 2020-03-11 DIAGNOSIS — I5042 Chronic combined systolic (congestive) and diastolic (congestive) heart failure: Secondary | ICD-10-CM | POA: Diagnosis not present

## 2020-03-11 DIAGNOSIS — I129 Hypertensive chronic kidney disease with stage 1 through stage 4 chronic kidney disease, or unspecified chronic kidney disease: Secondary | ICD-10-CM | POA: Diagnosis not present

## 2020-03-11 NOTE — Progress Notes (Signed)
This patient returns to my office for at risk foot care.  This patient requires this care by a professional since this patient will be at risk due to having diabetes type 2, CKD and chronic insufficiensy.  Patient is taking eliquiss.  This patient is unable to cut nails himself since the patient cannot reach his nails.These nails are painful walking and wearing shoes.  This patient presents for at risk foot care today.  General Appearance  Alert, conversant and in no acute stress.  Vascular  Dorsalis pedis and posterior tibial  pulses are palpable  bilaterally.  Capillary return is within normal limits  bilaterally. Temperature is within normal limits  bilaterally.  Neurologic  Senn-Weinstein monofilament wire test diminished   bilaterally. Muscle power within normal limits bilaterally.  Nails Thick disfigured discolored nails with subungual debris  from hallux to fifth toes bilaterally. No evidence of bacterial infection or drainage bilaterally.  Orthopedic  No limitations of motion  feet .  No crepitus or effusions noted.  Hammer toes second  B/L.  MCJ DJD  B/L. HAV  B/L.  Skin  normotropic skin with no porokeratosis noted bilaterally.  No signs of infections or ulcers noted.     Onychomycosis  Pain in right toes  Pain in left toes  Consent was obtained for treatment procedures.   Mechanical debridement of nails 1-5  bilaterally performed with a nail nipper.  Filed with dremel without incident. Patient qualifies for diabetic shoes due to DPN ,HAV,  Hammer toes and DJD.  Patient to make an appointment with Liliane Channel.   Return office visit  3 months                   Told patient to return for periodic foot care and evaluation due to potential at risk complications.   Gardiner Barefoot DPM

## 2020-03-13 ENCOUNTER — Other Ambulatory Visit: Payer: Self-pay | Admitting: Cardiovascular Disease

## 2020-03-14 ENCOUNTER — Ambulatory Visit: Payer: PPO | Admitting: Podiatry

## 2020-03-18 ENCOUNTER — Other Ambulatory Visit: Payer: Self-pay

## 2020-03-18 ENCOUNTER — Ambulatory Visit (HOSPITAL_COMMUNITY)
Admission: RE | Admit: 2020-03-18 | Discharge: 2020-03-18 | Disposition: A | Discharge status: Home / Self Care | Payer: PPO | Source: Ambulatory Visit | Attending: Nephrology | Admitting: Nephrology

## 2020-03-18 DIAGNOSIS — N184 Chronic kidney disease, stage 4 (severe): Secondary | ICD-10-CM | POA: Diagnosis not present

## 2020-03-18 DIAGNOSIS — N3289 Other specified disorders of bladder: Secondary | ICD-10-CM | POA: Diagnosis not present

## 2020-03-20 ENCOUNTER — Encounter: Payer: Self-pay | Admitting: Cardiovascular Disease

## 2020-03-20 NOTE — Progress Notes (Signed)
Nathan [name] Date of Birth  12-Sep-1933       [place]. [address], [place]    [place], [place]  [address]     [phone]       Fax  [phone]       Problem List: 1. Coronary artery disease-status post CABG 2. Aortic insufficiency 3. Hypercholesterolemia 4. Diabetes mellitus 5. Mitral regurgitation     Nathan [name] is doing very well. He's not had any episodes of chest pain or shortness of breath. 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  Dec. 2, 2014:  Nathan [name] is doing well.  Not as much energy.   No CP or dyspnea.    Dec. 3,2015:  Doing well from a cardiac standpoint.  started taking insulin recently.   Worked again for [name].   Dec. 2, 2016:  Doing well from a cardiac standpoint.   BP is high today , was normal yesterday   Feb. 14, 2017:   Nathan [name] is seen back for a visit BP is a bit elevated today .   Eats lots of salty foods ( soup for dinner) He had acute cholecystitis and had a perc drain placed.  The drain has been removed.  He never had his GB removed.  Was placed on home O2 during the hospitalization  And has been on home O2 since He does not think he needs it.   He occasionally will forget to turn it on and cannot tell the difference.  Will check his O2 sate on RA. ( time off oxygen  1440)   During his hospitalization, he had an echocardiogram that reveals normal left ventricle systolic function. He did have mild pulmonary hypertension. He had a stress Myoview study which revealed normal left ventricular systolic function and revealed no ischemia.  Aug. 17, 2017:  Doing well. Had the gall bladder percutaneous drain removed. Did not have GB removed.  Stands on his feet all day , has some leg edema   Feb. 13, 2018:  Doing well .   No further gall bladder issues  Sept.  26, 2018:  Nathan [name] is seen today for follow-up of his coronary artery  disease. Also  has a history of essential hypertension.  May 31, 2017 Doing well . Strawberries  Are ready - hes still working  No CP  No further gall bladder issues  August 29, 2017:  Nathan [name] is seen today  Has been having lots of orthostasis  Lightheadness. Has not been working - works at [name] - in [employer] building  Has been having lots of GI issues ( belching and GERD after eating ) , thinks he is having gall bladder issues.  Appetite has not been good  Labs from [name] office show a creatinine of 2.3  Had 4 of his pills stopped last week  Taking fish oil, amlodipine, ASA and potassium ( by mistake, was supposed to stop)  , MVI  Still eating salty meats   Oct. 21, 2019:  Nathan [name] is seen today  Has not had a good appetite for the past several months  Has a left leg DVT - Is now on Eliquis  orthostasis has improved    September 22, 2018:  Doing well.   No CP , no dyspnea.   BP is a little elevated.   March 26, 2019:  Nathan [name] is seen today for follow-up of his coronary artery disease and aortic insufficiency.  He also has a history of hyperlipidemia. Has retired from [employer].  No CP, Has some dyspnea if he works too quickly .   Feb. 4, 2022: Nathan [name] is seen today for follow up of his CAD, AI, HLD Has had some dizziness , especially when standing  He has occasional episodes of chest heaviness./ dyspnea He may have worseining CAD but we are limited in our ability to do a cath because of his CKD - stage 4.     Current Outpatient Medications on File Prior to Visit  Medication Sig Dispense Refill  . albuterol (VENTOLIN HFA) 108 (90 Base) MCG/ACT inhaler Inhale into the lungs.    [name] apixaban (ELIQUIS) 2.5 MG TABS tablet Take 2.5 mg by mouth 2 (two) times daily.    [name] atorvastatin (LIPITOR) 40 MG tablet Take 40 mg by mouth daily.  3  . Blood Glucose Monitoring Suppl (ONETOUCH VERIO) w/Device KIT     . carvedilol (COREG) 6.25 MG tablet Take 1 tablet (6.25  mg total) by mouth daily. 90 tablet 3  . FLUZONE HIGH-DOSE QUADRIVALENT 0.7 ML SUSY     . insulin NPH-regular Human (NOVOLIN 70/30) (70-30) 100 UNIT/ML injection Inject 40 Units into the skin See admin instructions. 40 units in the morning, 25 units at dinner    . isosorbide mononitrate (IMDUR) 30 MG 24 hr tablet Take 1 tablet (30 mg total) by mouth daily. 90 tablet 3  . Lancets (ONETOUCH ULTRASOFT) lancets     . Multiple Vitamins-Minerals (CENTRUM SILVER PO) Take 1 tablet by mouth daily.    . nitroGLYCERIN (NITROSTAT) 0.4 MG SL tablet Place 1 tablet (0.4 mg total) under the tongue every 5 (five) minutes as needed for chest pain. 25 tablet 5  . Omega-3 Fatty Acids (FISH OIL) 1200 MG CAPS Take 1,200 mg by mouth 2 (two) times daily.    [name] omeprazole (PRILOSEC) 20 MG capsule Take 20 mg by mouth 2 (two) times daily.     Glory Rosebush VERIO test strip     . RELION INSULIN SYRINGE 1ML/31G 31G X 5/16" 1 ML MISC USE 1 TWICE DAILY AS DIRECTED  6  . TOLAK 4 % CREA Apply to areas qhs x 3weeks     No current facility-administered medications on file prior to visit.    No Known Allergies  Past Medical History:  Diagnosis Date  . Acid reflux   . Aortic insufficiency   . Cholecystitis   . Combined systolic and diastolic heart failure ([facility])    Echo 04/2018: inf-lat HK, EF 40-45, severe basal septal hypertrophy, mild conc LVH, Gr 1 DD, severe LAE, mild to mod TR, mod AI, asc Aorta 39 mm  . Coronary artery disease 2002   CABG x5  . Diabetes mellitus   . DVT (deep venous thrombosis) ([facility])    previously on coumadin  . Hypercholesteremia   . Hyperlipidemia   . Mitral valve regurgitation   . Skin cancer    tumor removed off of his back    Past Surgical History:  Procedure Laterality Date  . BILIARY DRAINAGE CATHETER PLACEMENT W/ BILE DUCT TUBE CHANGE  01/2015  . CORONARY ARTERY BYPASS GRAFT  12/21/2000   x5  . LEFT HEART CATH AND CORS/GRAFTS ANGIOGRAPHY N/A 04/06/2018   Procedure: LEFT HEART CATH AND  CORS/GRAFTS ANGIOGRAPHY;  Surgeon: Lorretta Harp, MD;  Location: [facility];  Service: Cardiovascular;  Laterality: N/A;  . SKIN CANCER EXCISION    . TUMOR REMOVAL     off of back, skin cancer    Social History   Tobacco Use  Smoking Status Never Smoker  Smokeless Tobacco Never Used    Social History   Substance and Sexual Activity  Alcohol Use No    Family History  Problem Relation Age of Onset  . Heart attack Father   . Hypertension Mother   . Diabetes Brother   . Cancer Sister        breast  . Cancer Brother        throat & mouth    Reviw of Systems:  Noted in current history, otherwise review of systems is negative.  Physical Exam: Blood pressure 118/74, pulse 79, height 6' (1.829 m), weight 195 lb 12.8 oz (88.8 kg), SpO2 98 %.  GEN:  Well nourished, well developed in no acute distress HEENT: Normal NECK: No JVD; No carotid bruits LYMPHATICS: No lymphadenopathy CARDIAC: RR,  Soft systolic murmur  RESPIRATORY:  Clear to auscultation without rales, wheezing or rhonchi  ABDOMEN: Soft, non-tender, non-distended MUSCULOSKELETAL:  No edema; No deformity  SKIN: Warm and dry NEUROLOGIC:  Alert and oriented x 3    ECG:    March 21, 2020: Normal sinus rhythm at 79.  First-degree AV block..  RBBB   Nonspecific ST and T wave abnormalities.  No changes from previous EKG.  Assessment / Plan:   1. Coronary artery disease-      he has occasional episodes of chest tightness and shortness of breath with exertion but this resolves fairly quickly when he stops to rest.  He has nitroglycerin that he can take as needed.  While I think that the isosorbide might be contributing to his lightheadedness, I think that if we stop the isosorbide he will have increased angina.  Because of his chronic kidney disease stage IV I do not think that we should do a heart catheterization but continue with medical therapy for now.   2.  Chronic combined systolic / diastolic CHF:       3.  Left leg DVT : Continue Eliquis.  4.  Aortic insufficiency -stable.  5. Hypercholesterolemia-    Managed by Dr. Thales Knipple Aspen   6.   Lightheadedness: I suspect the isosorbide might be making him lightheaded.  He also is having some episodes of angina with exertion but I do not think that we should discontinue it because it would likely increase his episodes of angina.    Return in 6 months   [name], MD  03/21/2020 11:16 AM    [place],  [place], [place]  [address] Pager [phone] Phone: [phone]; Fax: [phone]

## 2020-03-21 ENCOUNTER — Other Ambulatory Visit: Payer: Self-pay

## 2020-03-21 ENCOUNTER — Encounter: Payer: Self-pay | Admitting: Cardiovascular Disease

## 2020-03-21 ENCOUNTER — Ambulatory Visit (INDEPENDENT_AMBULATORY_CARE_PROVIDER_SITE_OTHER): Payer: PPO | Admitting: Cardiovascular Disease

## 2020-03-21 VITALS — BP 118/74 | HR 79 | Ht 72.0 in | Wt 195.8 lb

## 2020-03-21 DIAGNOSIS — I351 Nonrheumatic aortic (valve) insufficiency: Secondary | ICD-10-CM | POA: Diagnosis not present

## 2020-03-21 DIAGNOSIS — E781 Pure hyperglyceridemia: Secondary | ICD-10-CM

## 2020-03-21 DIAGNOSIS — I25810 Atherosclerosis of coronary artery bypass graft(s) without angina pectoris: Secondary | ICD-10-CM

## 2020-03-21 MED ORDER — HYDRALAZINE HCL 25 MG PO TABS: 25.0000 mg | ORAL_TABLET | Freq: Two times a day (BID) | ORAL | 3 refills | Status: DC

## 2020-03-21 MED ORDER — FENOFIBRATE 145 MG PO TABS: 145.0000 mg | ORAL_TABLET | Freq: Every day | ORAL | 3 refills | Status: DC

## 2020-04-01 ENCOUNTER — Other Ambulatory Visit: Payer: Self-pay | Admitting: Cardiovascular Disease

## 2020-04-10 DIAGNOSIS — I25709 Atherosclerosis of coronary artery bypass graft(s), unspecified, with unspecified angina pectoris: Secondary | ICD-10-CM | POA: Diagnosis not present

## 2020-04-10 DIAGNOSIS — I951 Orthostatic hypotension: Secondary | ICD-10-CM | POA: Diagnosis not present

## 2020-04-10 DIAGNOSIS — I130 Hypertensive heart and chronic kidney disease with heart failure and stage 1 through stage 4 chronic kidney disease, or unspecified chronic kidney disease: Secondary | ICD-10-CM | POA: Diagnosis not present

## 2020-04-10 DIAGNOSIS — I509 Heart failure, unspecified: Secondary | ICD-10-CM | POA: Diagnosis not present

## 2020-04-10 DIAGNOSIS — N184 Chronic kidney disease, stage 4 (severe): Secondary | ICD-10-CM | POA: Diagnosis not present

## 2020-04-10 DIAGNOSIS — R55 Syncope and collapse: Secondary | ICD-10-CM | POA: Diagnosis not present

## 2020-04-10 DIAGNOSIS — E1129 Type 2 diabetes mellitus with other diabetic kidney complication: Secondary | ICD-10-CM | POA: Diagnosis not present

## 2020-04-25 DIAGNOSIS — L821 Other seborrheic keratosis: Secondary | ICD-10-CM | POA: Diagnosis not present

## 2020-04-25 DIAGNOSIS — L570 Actinic keratosis: Secondary | ICD-10-CM | POA: Diagnosis not present

## 2020-04-25 DIAGNOSIS — L814 Other melanin hyperpigmentation: Secondary | ICD-10-CM | POA: Diagnosis not present

## 2020-04-25 DIAGNOSIS — L820 Inflamed seborrheic keratosis: Secondary | ICD-10-CM | POA: Diagnosis not present

## 2020-04-30 ENCOUNTER — Encounter: Payer: Self-pay | Admitting: Cardiovascular Disease

## 2020-04-30 NOTE — Progress Notes (Signed)
   Pt was in the hospital , missed his appt.       This encounter was created in error - please disregard.

## 2020-05-01 ENCOUNTER — Inpatient Hospital Stay (HOSPITAL_COMMUNITY)
Admission: EM | Admit: 2020-05-01 | Discharge: 2020-05-12 | DRG: 871 | Disposition: A | Discharge status: Home Health | Payer: PPO | Attending: Internal Medicine | Admitting: Internal Medicine

## 2020-05-01 ENCOUNTER — Other Ambulatory Visit: Payer: Self-pay

## 2020-05-01 ENCOUNTER — Emergency Department (HOSPITAL_COMMUNITY): Payer: PPO

## 2020-05-01 ENCOUNTER — Observation Stay (HOSPITAL_COMMUNITY): Payer: PPO

## 2020-05-01 ENCOUNTER — Encounter (HOSPITAL_COMMUNITY): Payer: Self-pay | Admitting: Emergency Medicine

## 2020-05-01 DIAGNOSIS — E7800 Pure hypercholesterolemia, unspecified: Secondary | ICD-10-CM | POA: Diagnosis not present

## 2020-05-01 DIAGNOSIS — Z85828 Personal history of other malignant neoplasm of skin: Secondary | ICD-10-CM

## 2020-05-01 DIAGNOSIS — R1012 Left upper quadrant pain: Secondary | ICD-10-CM | POA: Diagnosis not present

## 2020-05-01 DIAGNOSIS — R6520 Severe sepsis without septic shock: Secondary | ICD-10-CM | POA: Diagnosis present

## 2020-05-01 DIAGNOSIS — R0902 Hypoxemia: Secondary | ICD-10-CM | POA: Diagnosis not present

## 2020-05-01 DIAGNOSIS — I471 Supraventricular tachycardia: Secondary | ICD-10-CM | POA: Diagnosis not present

## 2020-05-01 DIAGNOSIS — I509 Heart failure, unspecified: Secondary | ICD-10-CM | POA: Diagnosis not present

## 2020-05-01 DIAGNOSIS — K8071 Calculus of gallbladder and bile duct without cholecystitis with obstruction: Secondary | ICD-10-CM | POA: Diagnosis not present

## 2020-05-01 DIAGNOSIS — Z951 Presence of aortocoronary bypass graft: Secondary | ICD-10-CM

## 2020-05-01 DIAGNOSIS — R109 Unspecified abdominal pain: Secondary | ICD-10-CM | POA: Diagnosis not present

## 2020-05-01 DIAGNOSIS — I21A1 Myocardial infarction type 2: Secondary | ICD-10-CM | POA: Diagnosis not present

## 2020-05-01 DIAGNOSIS — J9601 Acute respiratory failure with hypoxia: Secondary | ICD-10-CM | POA: Diagnosis not present

## 2020-05-01 DIAGNOSIS — G4700 Insomnia, unspecified: Secondary | ICD-10-CM | POA: Diagnosis not present

## 2020-05-01 DIAGNOSIS — E785 Hyperlipidemia, unspecified: Secondary | ICD-10-CM | POA: Diagnosis present

## 2020-05-01 DIAGNOSIS — N184 Chronic kidney disease, stage 4 (severe): Secondary | ICD-10-CM | POA: Diagnosis not present

## 2020-05-01 DIAGNOSIS — R7989 Other specified abnormal findings of blood chemistry: Secondary | ICD-10-CM

## 2020-05-01 DIAGNOSIS — E118 Type 2 diabetes mellitus with unspecified complications: Secondary | ICD-10-CM

## 2020-05-01 DIAGNOSIS — J181 Lobar pneumonia, unspecified organism: Secondary | ICD-10-CM | POA: Diagnosis not present

## 2020-05-01 DIAGNOSIS — E1122 Type 2 diabetes mellitus with diabetic chronic kidney disease: Secondary | ICD-10-CM | POA: Diagnosis present

## 2020-05-01 DIAGNOSIS — Z833 Family history of diabetes mellitus: Secondary | ICD-10-CM

## 2020-05-01 DIAGNOSIS — J9811 Atelectasis: Secondary | ICD-10-CM | POA: Diagnosis present

## 2020-05-01 DIAGNOSIS — Z20822 Contact with and (suspected) exposure to covid-19: Secondary | ICD-10-CM | POA: Diagnosis present

## 2020-05-01 DIAGNOSIS — I255 Ischemic cardiomyopathy: Secondary | ICD-10-CM | POA: Diagnosis present

## 2020-05-01 DIAGNOSIS — I5020 Unspecified systolic (congestive) heart failure: Secondary | ICD-10-CM | POA: Diagnosis not present

## 2020-05-01 DIAGNOSIS — K8067 Calculus of gallbladder and bile duct with acute and chronic cholecystitis with obstruction: Secondary | ICD-10-CM | POA: Diagnosis present

## 2020-05-01 DIAGNOSIS — K8021 Calculus of gallbladder without cholecystitis with obstruction: Secondary | ICD-10-CM | POA: Diagnosis present

## 2020-05-01 DIAGNOSIS — K760 Fatty (change of) liver, not elsewhere classified: Secondary | ICD-10-CM | POA: Diagnosis present

## 2020-05-01 DIAGNOSIS — E119 Type 2 diabetes mellitus without complications: Secondary | ICD-10-CM | POA: Diagnosis not present

## 2020-05-01 DIAGNOSIS — N170 Acute kidney failure with tubular necrosis: Secondary | ICD-10-CM

## 2020-05-01 DIAGNOSIS — I351 Nonrheumatic aortic (valve) insufficiency: Secondary | ICD-10-CM | POA: Diagnosis present

## 2020-05-01 DIAGNOSIS — R627 Adult failure to thrive: Secondary | ICD-10-CM | POA: Diagnosis present

## 2020-05-01 DIAGNOSIS — Z7982 Long term (current) use of aspirin: Secondary | ICD-10-CM

## 2020-05-01 DIAGNOSIS — I214 Non-ST elevation (NSTEMI) myocardial infarction: Secondary | ICD-10-CM | POA: Diagnosis not present

## 2020-05-01 DIAGNOSIS — Z79899 Other long term (current) drug therapy: Secondary | ICD-10-CM

## 2020-05-01 DIAGNOSIS — D696 Thrombocytopenia, unspecified: Secondary | ICD-10-CM | POA: Diagnosis not present

## 2020-05-01 DIAGNOSIS — I700 Atherosclerosis of aorta: Secondary | ICD-10-CM | POA: Diagnosis present

## 2020-05-01 DIAGNOSIS — Z86718 Personal history of other venous thrombosis and embolism: Secondary | ICD-10-CM

## 2020-05-01 DIAGNOSIS — K219 Gastro-esophageal reflux disease without esophagitis: Secondary | ICD-10-CM | POA: Diagnosis present

## 2020-05-01 DIAGNOSIS — I248 Other forms of acute ischemic heart disease: Secondary | ICD-10-CM | POA: Diagnosis not present

## 2020-05-01 DIAGNOSIS — Z934 Other artificial openings of gastrointestinal tract status: Secondary | ICD-10-CM | POA: Diagnosis not present

## 2020-05-01 DIAGNOSIS — Z7901 Long term (current) use of anticoagulants: Secondary | ICD-10-CM

## 2020-05-01 DIAGNOSIS — D649 Anemia, unspecified: Secondary | ICD-10-CM | POA: Diagnosis present

## 2020-05-01 DIAGNOSIS — I10 Essential (primary) hypertension: Secondary | ICD-10-CM | POA: Diagnosis not present

## 2020-05-01 DIAGNOSIS — Z6823 Body mass index (BMI) 23.0-23.9, adult: Secondary | ICD-10-CM | POA: Diagnosis not present

## 2020-05-01 DIAGNOSIS — I5042 Chronic combined systolic (congestive) and diastolic (congestive) heart failure: Secondary | ICD-10-CM | POA: Diagnosis present

## 2020-05-01 DIAGNOSIS — I440 Atrioventricular block, first degree: Secondary | ICD-10-CM | POA: Diagnosis present

## 2020-05-01 DIAGNOSIS — K8001 Calculus of gallbladder with acute cholecystitis with obstruction: Secondary | ICD-10-CM | POA: Diagnosis not present

## 2020-05-01 DIAGNOSIS — K810 Acute cholecystitis: Secondary | ICD-10-CM

## 2020-05-01 DIAGNOSIS — R1011 Right upper quadrant pain: Principal | ICD-10-CM

## 2020-05-01 DIAGNOSIS — Z794 Long term (current) use of insulin: Secondary | ICD-10-CM

## 2020-05-01 DIAGNOSIS — R112 Nausea with vomiting, unspecified: Secondary | ICD-10-CM | POA: Diagnosis not present

## 2020-05-01 DIAGNOSIS — R100 Acute abdomen: Secondary | ICD-10-CM | POA: Diagnosis not present

## 2020-05-01 DIAGNOSIS — I951 Orthostatic hypotension: Secondary | ICD-10-CM | POA: Diagnosis present

## 2020-05-01 DIAGNOSIS — R945 Abnormal results of liver function studies: Secondary | ICD-10-CM | POA: Diagnosis not present

## 2020-05-01 DIAGNOSIS — I452 Bifascicular block: Secondary | ICD-10-CM | POA: Diagnosis present

## 2020-05-01 DIAGNOSIS — Z8249 Family history of ischemic heart disease and other diseases of the circulatory system: Secondary | ICD-10-CM

## 2020-05-01 DIAGNOSIS — J150 Pneumonia due to Klebsiella pneumoniae: Secondary | ICD-10-CM | POA: Diagnosis present

## 2020-05-01 DIAGNOSIS — I130 Hypertensive heart and chronic kidney disease with heart failure and stage 1 through stage 4 chronic kidney disease, or unspecified chronic kidney disease: Secondary | ICD-10-CM | POA: Diagnosis not present

## 2020-05-01 DIAGNOSIS — A4151 Sepsis due to Escherichia coli [E. coli]: Principal | ICD-10-CM | POA: Diagnosis present

## 2020-05-01 DIAGNOSIS — Z515 Encounter for palliative care: Secondary | ICD-10-CM | POA: Diagnosis not present

## 2020-05-01 DIAGNOSIS — R1010 Upper abdominal pain, unspecified: Secondary | ICD-10-CM | POA: Diagnosis not present

## 2020-05-01 DIAGNOSIS — K828 Other specified diseases of gallbladder: Secondary | ICD-10-CM | POA: Diagnosis not present

## 2020-05-01 DIAGNOSIS — E43 Unspecified severe protein-calorie malnutrition: Secondary | ICD-10-CM | POA: Diagnosis not present

## 2020-05-01 DIAGNOSIS — K8020 Calculus of gallbladder without cholecystitis without obstruction: Secondary | ICD-10-CM | POA: Diagnosis not present

## 2020-05-01 DIAGNOSIS — R1084 Generalized abdominal pain: Secondary | ICD-10-CM

## 2020-05-01 DIAGNOSIS — Z7189 Other specified counseling: Secondary | ICD-10-CM | POA: Diagnosis not present

## 2020-05-01 DIAGNOSIS — Z66 Do not resuscitate: Secondary | ICD-10-CM | POA: Diagnosis not present

## 2020-05-01 DIAGNOSIS — Z789 Other specified health status: Secondary | ICD-10-CM | POA: Diagnosis not present

## 2020-05-01 DIAGNOSIS — R000 Tachycardia, unspecified: Secondary | ICD-10-CM

## 2020-05-01 DIAGNOSIS — N183 Chronic kidney disease, stage 3 unspecified: Secondary | ICD-10-CM | POA: Diagnosis not present

## 2020-05-01 DIAGNOSIS — R1013 Epigastric pain: Secondary | ICD-10-CM | POA: Diagnosis not present

## 2020-05-01 DIAGNOSIS — A419 Sepsis, unspecified organism: Secondary | ICD-10-CM | POA: Diagnosis not present

## 2020-05-01 DIAGNOSIS — R0600 Dyspnea, unspecified: Secondary | ICD-10-CM | POA: Diagnosis not present

## 2020-05-01 DIAGNOSIS — I2510 Atherosclerotic heart disease of native coronary artery without angina pectoris: Secondary | ICD-10-CM | POA: Diagnosis present

## 2020-05-01 LAB — HEMOGLOBIN A1C
Hgb A1c MFr Bld: 7.9 % — ABNORMAL HIGH (ref 4.8–5.6)
Mean Plasma Glucose: 180.03 mg/dL

## 2020-05-01 LAB — HEPATIC FUNCTION PANEL
ALT: 112 U/L — ABNORMAL HIGH (ref 0–44)
AST: 273 U/L — ABNORMAL HIGH (ref 15–41)
Albumin: 3.3 g/dL — ABNORMAL LOW (ref 3.5–5.0)
Alkaline Phosphatase: 50 U/L (ref 38–126)
Bilirubin, Direct: 1 mg/dL — ABNORMAL HIGH (ref 0.0–0.2)
Indirect Bilirubin: 1 mg/dL — ABNORMAL HIGH (ref 0.3–0.9)
Total Bilirubin: 2 mg/dL — ABNORMAL HIGH (ref 0.3–1.2)
Total Protein: 6.6 g/dL (ref 6.5–8.1)

## 2020-05-01 LAB — TROPONIN I (HIGH SENSITIVITY)
Troponin I (High Sensitivity): 28 ng/L — ABNORMAL HIGH (ref <18)
Troponin I (High Sensitivity): 24 ng/L — ABNORMAL HIGH (ref <18)
Troponin I (High Sensitivity): 305 ng/L (ref <18)
Troponin I (High Sensitivity): 2988 ng/L (ref <18)
Troponin I (High Sensitivity): 5012 ng/L (ref <18)

## 2020-05-01 LAB — CBC
HCT: 38.1 % — ABNORMAL LOW (ref 39.0–52.0)
Hemoglobin: 12.9 g/dL — ABNORMAL LOW (ref 13.0–17.0)
MCH: 30.6 pg (ref 26.0–34.0)
MCHC: 33.9 g/dL (ref 30.0–36.0)
MCV: 90.5 fL (ref 80.0–100.0)
Platelets: 250 10*3/uL (ref 150–400)
RBC: 4.21 MIL/uL — ABNORMAL LOW (ref 4.22–5.81)
RDW: 13.6 % (ref 11.5–15.5)
WBC: 10.8 10*3/uL — ABNORMAL HIGH (ref 4.0–10.5)
nRBC: 0 % (ref 0.0–0.2)

## 2020-05-01 LAB — GLUCOSE, CAPILLARY: Glucose-Capillary: 178 mg/dL — ABNORMAL HIGH (ref 70–99)

## 2020-05-01 LAB — BASIC METABOLIC PANEL
Anion gap: 9 (ref 5–15)
BUN: 35 mg/dL — ABNORMAL HIGH (ref 8–23)
CO2: 21 mmol/L — ABNORMAL LOW (ref 22–32)
Calcium: 8.8 mg/dL — ABNORMAL LOW (ref 8.9–10.3)
Chloride: 108 mmol/L (ref 98–111)
Creatinine, Ser: 2.24 mg/dL — ABNORMAL HIGH (ref 0.61–1.24)
GFR, Estimated: 28 mL/min — ABNORMAL LOW (ref >60)
Glucose, Bld: 137 mg/dL — ABNORMAL HIGH (ref 70–99)
Potassium: 3.6 mmol/L (ref 3.5–5.1)
Sodium: 138 mmol/L (ref 135–145)

## 2020-05-01 LAB — HEPATITIS PANEL, ACUTE
HCV Ab: NONREACTIVE
Hep A IgM: NONREACTIVE
Hep B C IgM: NONREACTIVE
Hepatitis B Surface Ag: NONREACTIVE

## 2020-05-01 LAB — LIPASE, BLOOD: Lipase: 43 U/L (ref 11–51)

## 2020-05-01 LAB — RESP PANEL BY RT-PCR (FLU A&B, COVID) ARPGX2
Influenza A by PCR: NEGATIVE
Influenza B by PCR: NEGATIVE
SARS Coronavirus 2 by RT PCR: NEGATIVE

## 2020-05-01 LAB — MRSA PCR SCREENING: MRSA by PCR: NEGATIVE

## 2020-05-01 LAB — CBG MONITORING, ED
Glucose-Capillary: 129 mg/dL — ABNORMAL HIGH (ref 70–99)
Glucose-Capillary: 158 mg/dL — ABNORMAL HIGH (ref 70–99)

## 2020-05-01 MED ORDER — PIPERACILLIN-TAZOBACTAM 3.375 G IVPB
3.3750 g | Freq: Three times a day (TID) | INTRAVENOUS | Status: DC
  Administered 2020-05-01 – 2020-05-02 (×3): 3.375 g via INTRAVENOUS
  Filled 2020-05-01 (×4): qty 50

## 2020-05-01 MED ORDER — ACETAMINOPHEN 325 MG PO TABS
650.0000 mg | ORAL_TABLET | Freq: Four times a day (QID) | ORAL | Status: DC | PRN
  Administered 2020-05-03 – 2020-05-06 (×2): 650 mg via ORAL
  Filled 2020-05-01 (×2): qty 2

## 2020-05-01 MED ORDER — ONDANSETRON HCL 4 MG/2ML IJ SOLN
INTRAMUSCULAR | Status: AC
  Administered 2020-05-01: 4 mg via INTRAVENOUS
  Filled 2020-05-01: qty 2

## 2020-05-01 MED ORDER — PANTOPRAZOLE SODIUM 40 MG IV SOLR
40.0000 mg | Freq: Once | INTRAVENOUS | Status: AC
  Administered 2020-05-01: 40 mg via INTRAVENOUS
  Filled 2020-05-01: qty 40

## 2020-05-01 MED ORDER — ONDANSETRON HCL 4 MG/2ML IJ SOLN: 4.0000 mg | Freq: Once | INTRAMUSCULAR | Status: AC

## 2020-05-01 MED ORDER — SODIUM CHLORIDE 0.9 % IV BOLUS
500.0000 mL | Freq: Once | INTRAVENOUS | Status: AC
  Administered 2020-05-01: 500 mL via INTRAVENOUS

## 2020-05-01 MED ORDER — MORPHINE SULFATE (PF) 4 MG/ML IV SOLN
4.0000 mg | Freq: Once | INTRAVENOUS | Status: AC
  Administered 2020-05-01: 4 mg via INTRAVENOUS
  Filled 2020-05-01: qty 1

## 2020-05-01 MED ORDER — TECHNETIUM TC 99M MEBROFENIN IV KIT
7.6000 | PACK | Freq: Once | INTRAVENOUS | Status: AC | PRN
  Administered 2020-05-01: 7.6 via INTRAVENOUS

## 2020-05-01 MED ORDER — INSULIN ASPART 100 UNIT/ML ~~LOC~~ SOLN: 0.0000 [IU] | Freq: Every day | SUBCUTANEOUS | Status: DC

## 2020-05-01 MED ORDER — MORPHINE SULFATE (PF) 4 MG/ML IV SOLN
4.0000 mg | Freq: Once | INTRAVENOUS | Status: DC
  Filled 2020-05-01: qty 1

## 2020-05-01 MED ORDER — ONDANSETRON HCL 4 MG/2ML IJ SOLN
4.0000 mg | Freq: Once | INTRAMUSCULAR | Status: AC
  Administered 2020-05-01: 4 mg via INTRAVENOUS
  Filled 2020-05-01: qty 2

## 2020-05-01 MED ORDER — ALBUTEROL SULFATE (2.5 MG/3ML) 0.083% IN NEBU: 3.0000 mL | INHALATION_SOLUTION | Freq: Four times a day (QID) | RESPIRATORY_TRACT | Status: DC | PRN

## 2020-05-01 MED ORDER — LACTATED RINGERS IV SOLN: INTRAVENOUS | Status: DC

## 2020-05-01 MED ORDER — SODIUM CHLORIDE 0.9 % IV SOLN
INTRAVENOUS | Status: DC | PRN
  Administered 2020-05-01: 250 mL via INTRAVENOUS

## 2020-05-01 MED ORDER — MORPHINE SULFATE (PF) 2 MG/ML IV SOLN
2.0000 mg | INTRAVENOUS | Status: DC | PRN
  Administered 2020-05-01 – 2020-05-02 (×4): 2 mg via INTRAVENOUS
  Filled 2020-05-01 (×4): qty 1

## 2020-05-01 MED ORDER — HYDRALAZINE HCL 20 MG/ML IJ SOLN: 10.0000 mg | INTRAMUSCULAR | Status: DC | PRN

## 2020-05-01 MED ORDER — ACETAMINOPHEN 650 MG RE SUPP: 650.0000 mg | Freq: Four times a day (QID) | RECTAL | Status: DC | PRN

## 2020-05-01 MED ORDER — METOPROLOL TARTRATE 5 MG/5ML IV SOLN
2.5000 mg | Freq: Four times a day (QID) | INTRAVENOUS | Status: DC | PRN
  Administered 2020-05-01 (×2): 2.5 mg via INTRAVENOUS
  Filled 2020-05-01 (×3): qty 5

## 2020-05-01 MED ORDER — ONDANSETRON HCL 4 MG/2ML IJ SOLN
4.0000 mg | Freq: Four times a day (QID) | INTRAMUSCULAR | Status: DC | PRN
  Administered 2020-05-01: 4 mg via INTRAVENOUS
  Filled 2020-05-01: qty 2

## 2020-05-01 MED ORDER — ONDANSETRON 4 MG PO TBDP
4.0000 mg | ORAL_TABLET | Freq: Four times a day (QID) | ORAL | Status: DC | PRN
  Administered 2020-05-06 – 2020-05-10 (×2): 4 mg via ORAL
  Filled 2020-05-01 (×2): qty 1

## 2020-05-01 MED ORDER — INSULIN ASPART 100 UNIT/ML ~~LOC~~ SOLN
0.0000 [IU] | Freq: Three times a day (TID) | SUBCUTANEOUS | Status: DC
  Administered 2020-05-01: 2 [IU] via SUBCUTANEOUS
  Administered 2020-05-02: 3 [IU] via SUBCUTANEOUS

## 2020-05-01 NOTE — Consult Note (Addendum)
Cardiology Consultation:   Patient ID: Iban [name] MRN: [id number]; DOB: [birth year]  Admit date: 05/01/2020 Date of Consult: 05/01/2020  PCP:  Nathan Battles, MD   [name] Nathan  Cardiologist:  Nathan Moores, MD   Patient Profile:   Nathan [name] is a 87 y.o. male with a hx of CAD s/p CABG, HTN, HLD, DM, cholecystitis (deferred lap chole), CKD stage IV, and chronic systolic and diastolic heart failrue who is being seen today for the evaluation of abnormal EKG in the setting of abdominal pain at the request of Dr. Lorin [name].  History of Present Illness:   [name] has a history of CAD and is s/p CABG x 5 in 2002. Nuclear stress test in 2017 nonischemic. Echo in 2020 showed EF 40-45% with AR, DD. BB changed from atenolol to coreg. EF previously normal (2019). Heart cath in Feb 2020 showed occluded SVG-diagonal, occluded SVG-marginal, patent SVG-PDA-PLA with collaterals to distal Cx, patent LIMA-LAD. Medical therapy was recommended. Pt has a left LE DVT now on eliquis (09/19/17).  He was last seen by Dr. Acie [name]  03/21/20 and reported GI issues, poor PO intake, dizziness upon standing, and occasional episode of chest heaviness Repeat angiography was deferred due to CKD stage IV.  Unfortunately, he presented to [name] this morning with severe abdominal pain and shortness of breath. LFTs elevated, troponin 28 --> 24. RUQ US shows biliary sludge but no evidence of cholecystitis. Hx of normal HIDA scan in 2019. Surgery was consulted and suggested a repeat HIDA scan to evaluate lodged stone.   EKG does show sinus rhythm with HR 67, RBBB, LAFB, first degree heart block. Repeat EKG with rapid HR 146 with known RBBB.   Cardiology was consulted for abnormal EKG. Pt reports nausea and vomiting with severe abdominal pain. He has been vomiting since 3AM this morning. He does deny chest pain. He is short of breath due to abdominal pain. He also reports two episodes of syncope in the last 2  months - last episode was 4 weeks ago while standing[place] He was scheduled to see Dr. Acie [name] tomorrow to discuss his episodes of syncope. Was standing prior to both episodes with dizziness just before LOC.   He just lost his ex-wife 1 month ago to colectomy complications.   Past Medical History:  Diagnosis Date   Acid reflux    Aortic insufficiency    Cholecystitis    Combined systolic and diastolic heart failure ([place])    Echo 04/2018: inf-lat HK, EF 40-45, severe basal septal hypertrophy, mild conc LVH, Gr 1 DD, severe LAE, mild to mod TR, mod AI, asc Aorta 39 mm   Coronary artery disease 2002   CABG x5   Diabetes mellitus    DVT (deep venous thrombosis) (HCC)    previously on coumadin   Hypercholesteremia    Hyperlipidemia    Mitral valve regurgitation    Skin cancer    tumor removed off of his back    Past Surgical History:  Procedure Laterality Date   BILIARY DRAINAGE CATHETER PLACEMENT W/ BILE DUCT TUBE CHANGE  01/2015   CORONARY ARTERY BYPASS GRAFT  12/21/2000   x5   LEFT HEART CATH AND CORS/GRAFTS ANGIOGRAPHY N/A 04/06/2018   Procedure: LEFT HEART CATH AND CORS/GRAFTS ANGIOGRAPHY;  Surgeon: Nathan Harp, MD;  Location: [facility];  Service: Cardiovascular;  Laterality: N/A;   SKIN CANCER EXCISION     TUMOR REMOVAL     off  of back, skin cancer     Home Medications:  Prior to Admission medications   Medication Sig Start Date End Date Taking? Authorizing Provider  albuterol (VENTOLIN HFA) 108 (90 Base) MCG/ACT inhaler Inhale 1-2 puffs into the lungs every 6 (six) hours as needed for shortness of breath or wheezing. 11/13/19  Yes [provider]  apixaban (ELIQUIS) 2.5 MG TABS tablet Take 2.5 mg by mouth 2 (two) times daily.   Yes [provider]  atorvastatin (LIPITOR) 40 MG tablet Take 40 mg by mouth daily. 02/02/15  Yes [provider]  Blood Glucose Monitoring Suppl (ONETOUCH VERIO) w/Device KIT   09/06/17  Yes [provider]  carvedilol (COREG) 6.25 MG tablet TAKE 1 TABLET BY MOUTH EVERY DAY Patient taking differently: Take 6.25 mg by mouth daily. 04/01/20  Yes Nahser, Wonda Cheng, MD  fenofibrate (TRICOR) 145 MG tablet Take 1 tablet (145 mg total) by mouth daily. 03/21/20  Yes Nahser, Wonda Cheng, MD  guaifenesin (ROBITUSSIN) 100 MG/5ML syrup Take 200 mg by mouth 3 (three) times daily as needed for cough or congestion.   Yes [provider]  hydrALAZINE (APRESOLINE) 25 MG tablet Take 1 tablet (25 mg total) by mouth 2 (two) times daily. 03/21/20  Yes Nahser, Wonda Cheng, MD  insulin NPH-regular Human (NOVOLIN 70/30) (70-30) 100 UNIT/ML injection Inject 20-35 Units into the skin See admin instructions. 35 units in the morning, 20 units at dinner   Yes [provider]  isosorbide mononitrate (IMDUR) 30 MG 24 hr tablet Take 1 tablet (30 mg total) by mouth daily. 07/10/19  Yes Nahser, Wonda Cheng, MD  Lancets Glory Rosebush ULTRASOFT) lancets  09/06/17  Yes [provider]  Multiple Vitamins-Minerals (CENTRUM SILVER PO) Take 1 tablet by mouth daily.   Yes [provider]  nitroGLYCERIN (NITROSTAT) 0.4 MG SL tablet Place 1 tablet (0.4 mg total) under the tongue every 5 (five) minutes as needed for chest pain. 04/04/18  Yes [name], Nathan T, PA-C  Omega-3 Fatty Acids (FISH OIL) 1200 MG CAPS Take 1,200 mg by mouth 2 (two) times daily.   Yes [provider]  omeprazole (PRILOSEC) 20 MG capsule Take 20 mg by mouth 2 (two) times daily.  01/06/11  Yes [provider]  ONETOUCH VERIO test strip  09/06/17  Yes [provider]  Dougherty 1ML/31G 31G X 5/16" 1 ML MISC USE 1 TWICE DAILY AS DIRECTED 09/29/17  Yes [provider]  tamsulosin (FLOMAX) 0.4 MG CAPS capsule Take 0.4 mg by mouth at bedtime. 03/11/20  Yes [provider]  TOLAK 4 % CREA Apply to areas qhs x 3weeks 01/25/20  Yes [provider]    Inpatient  Medications: Scheduled Meds:   morphine injection  4 mg Intravenous Once   Continuous Infusions:   PRN Meds:   Allergies:   No Known Allergies  Social History:   Social History   Socioeconomic History   Marital status: Divorced    Spouse name: Not on file   Number of children: Not on file   Years of education: Not on file   Highest education level: Not on file  Occupational History   Not on file  Tobacco Use   Smoking status: Never Smoker   Smokeless tobacco: Never Used  Vaping Use   Vaping Use: Never used  Substance and Sexual Activity   Alcohol use: No   Drug use: No   Sexual activity: Not Currently  Other Topics Concern   Not on  file  Social History Narrative   Not on file   Social Determinants of Health   Financial Resource Strain: Not on file  Food Insecurity: Not on file  Transportation Needs: Not on file  Physical Activity: Not on file  Stress: Not on file  Social Connections: Not on file  Intimate Partner Violence: Not on file    Family History:    Family History  Problem Relation Age of Onset   Heart attack Father    Hypertension Mother    Diabetes Brother    Cancer Sister        breast   Cancer Brother        throat & mouth     ROS:  Please see the history of present illness.  All other ROS reviewed and negative.     Physical Exam/Data:   Vitals:   05/01/20 0500 05/01/20 0530 05/01/20 0623 05/01/20 0745  BP: (!) 156/82 (!) 173/86 134/85 (!) 146/95  Pulse: 85 89 97 (!) 138  Resp: (!) 28 (!) 31 (!) 23 (!) 23  Temp:      TempSrc:      SpO2: 93% 94% 92% 92%  Weight:      Height:        Intake/Output Summary (Last 24 hours) at 05/01/2020 0827 Last data filed at 05/01/2020 0626 Gross per 24 hour  Intake 500 ml  Output --  Net 500 ml   Last 3 Weights 05/01/2020 03/21/2020 03/26/2019  Weight (lbs) 198 lb 195 lb 12.8 oz 196 lb  Weight (kg) 89.812 kg 88.814 kg 88.905 kg     Body mass index is 26.85 kg/m.  General:  Elderly male in  mild distress, vomiting during exam HEENT: normal Neck: no JVD Vascular: No carotid bruits Cardiac:  Irregular rhythm, tachycardic rate Lungs:  Diminished in bases, no crackles  Abd: soft, nontender, no hepatomegaly  Ext: 2+ B LE edema Musculoskeletal:  No deformities, BUE and BLE strength normal and equal Skin: warm and dry  Neuro:  CNs 2-12 intact, no focal abnormalities noted Psych:  Normal affect   EKG:  The EKG was personally reviewed and demonstrates:  Sinus tachycardia with HR 146, RBBB, ST changes throughout ( global ST depressions occur as faster rates) Telemetry:  Telemetry was personally reviewed and demonstrates:  Sinus tachycardia 1st Hb RBBB with likely bouts of SVT - HR in the 130-150s  Relevant CV Studies:  Echo 04/26/2018:  1. Moderate hypokinesis of the left ventricular, entire inferolateral  wall.   2. The left ventricle has mild-moderately reduced systolic function, with  an ejection fraction of 40-45%. The cavity size was normal. severe  hypertrophy of the basal septum with otherwise mild concentric  hypertrophy.. Left ventricular diastolic Doppler  parameters are consistent with impaired relaxation. Indeterminate filling  pressures.   3. The right ventricle has normal systolic function. The cavity was  normal. There is no increase in right ventricular wall thickness.   4. Left atrial size was severely dilated.   5. The mitral valve is grossly normal. Mild thickening of the mitral  valve leaflet. Mild calcification of the mitral valve leaflet.   6. Tricuspid valve regurgitation is mild-moderate.   7. The aortic valve is abnormal Moderate thickening of the aortic valve  Moderate calcification of the aortic valve. Aortic valve regurgitation is  moderate by color flow Doppler.   8. There appears to be near fusion of the left and non-coronary cusps.  Eccentric aortic regurgitation. Pressure half time 346  ms.   9. There is mild dilatation of the ascending aorta  measuring 39 mm.    Left heart cath 03/2018: Ost LM to Dist LM lesion is 99% stenosed. Prox RCA lesion is 99% stenosed. Mid RCA lesion is 100% stenosed. Origin to Prox Graft lesion is 100% stenosed. Origin to Prox Graft lesion is 100% stenosed. Ost Cx to Prox Cx lesion is 99% stenosed. Prox Cx lesion is 100% stenosed. Ost LAD to Prox LAD lesion is 100% stenosed.  IMPRESSION: Mr. Lucier has an occluded vein to the diagonal branch and OM.  The vein to the PDA and PLA sequentially is widely patent and provides collaterals to the distal circumflex.  The LIMA backfills the diagonal branch.  70 cc of contrast were used for the case.  I performed a femoral angiogram and MYNX closed the right common femoral puncture site.  Medical therapy will be recommended.  He will be discharged home later today as an outpatient and follow-up with Dr. Acie [name] .  He can restart his Eliquis tomorrow.   Laboratory Data:  High Sensitivity Troponin:   Recent Labs  Lab 05/01/20 0156 05/01/20 0431  TROPONINIHS 28* 24*     Chemistry Recent Labs  Lab 05/01/20 0156  NA 138  K 3.6  CL 108  CO2 21*  GLUCOSE 137*  BUN 35*  CREATININE 2.24*  CALCIUM 8.8*  GFRNONAA 28*  ANIONGAP 9    Recent Labs  Lab 05/01/20 0304  PROT 6.6  ALBUMIN 3.3*  AST 273*  ALT 112*  ALKPHOS 50  BILITOT 2.0*   Hematology Recent Labs  Lab 05/01/20 0156  WBC 10.8*  RBC 4.21*  HGB 12.9*  HCT 38.1*  MCV 90.5  MCH 30.6  MCHC 33.9  RDW 13.6  PLT 250   BNPNo results for input(s): BNP, PROBNP in the last 168 hours.  DDimer No results for input(s): DDIMER in the last 168 hours.   Radiology/Studies:  CT ABDOMEN PELVIS WO CONTRAST  Result Date: 05/01/2020 CLINICAL DATA:  Acute nonlocalized abdominal pain. Epigastric pain and shortness of breath that began tonight EXAM: CT ABDOMEN AND PELVIS WITHOUT CONTRAST TECHNIQUE: Multidetector CT imaging of the abdomen and pelvis was performed following the standard protocol without  IV contrast. COMPARISON:  10/26/2017 FINDINGS: Lower chest: Coronary atherosclerosis. Prior CABG. Fatty hiatal hernia. Calcified right hilar lymph nodes. Hepatobiliary: No focal liver abnormality.Cholelithiasis. No evidence of acute cholecystitis. Pancreas: Unremarkable. Spleen: Unremarkable. Adrenals/Urinary Tract: Negative adrenals. Symmetric renal atrophy. Renal sinus cysts on both sides. No hydronephrosis. Moderate distension of the urinary bladder with generalized wall thickening. Stomach/Bowel: No obstruction. No visible bowel inflammation. Mild distal colonic diverticulosis. Vascular/Lymphatic: No acute vascular abnormality. Atheromatous calcification of the aorta and branch vessels. No mass or adenopathy. Reproductive:Symmetric enlargement of the prostate which uplifts the bladder base. Other: No ascites or pneumoperitoneum. Shallow fatty right inguinal hernia suggested on coronal reformats. There could be some fat herniation along the upper right femoral ring. Musculoskeletal: No acute abnormalities. L5 chronic pars defects with L5-S1 advanced disc degeneration, anterolisthesis, and severe biforaminal impingement. Spinal degeneration is generalized in the lower thoracic and lumbar spine. IMPRESSION: 1. No acute finding or specific cause of symptoms. 2. Cholelithiasis. 3. Enlarged prostate with findings of chronic outlet obstruction affecting the bladder. 4.  Aortic Atherosclerosis (ICD10-I70.0). Electronically Signed   By: Monte Fantasia M.D.   On: 05/01/2020 05:30   DG Chest 2 View  Result Date: 05/01/2020 CLINICAL DATA:  Epigastric pain, short of breath EXAM: CHEST - 2  VIEW COMPARISON:  02/18/2015, 11/04/2017 FINDINGS: Frontal and lateral views of the chest demonstrates stable postsurgical changes from median sternotomy. The cardiac silhouette is unremarkable. Lung volumes are diminished, with bibasilar consolidation, left greater than right, likely representing atelectasis. No effusion or  pneumothorax. No acute bony abnormalities. IMPRESSION: 1. Low lung volumes, with bibasilar consolidation likely reflecting atelectasis. Electronically Signed   By: Randa Ngo M.D.   On: 05/01/2020 02:16   US Abdomen Limited RUQ (LIVER/GB)  Result Date: 05/01/2020 CLINICAL DATA:  Right upper quadrant pain. EXAM: ULTRASOUND ABDOMEN LIMITED RIGHT UPPER QUADRANT COMPARISON:  Ultrasound renal 03/18/2020, CT abdomen pelvis 10/26/2017, ultrasound abdomen 09/28/2017 FINDINGS: Gallbladder: Gallbladder sludge. No gallstones. No gallbladder wall thickening or pericholecystic fluid. No sonographic Murphy sign noted by sonographer. Common bile duct: Diameter: 5 mm. Liver: Limited evaluation with no definite focal lesion identified. Slightly increased parenchymal echogenicity. Portal vein is patent on color Doppler imaging with normal direction of blood flow towards the liver. Other: None. IMPRESSION: 1. Gallbladder sludge with no findings of acute cholecystitis or choledocholithiasis. 2. Hepatic steatosis. Please note limited evaluation for focal hepatic masses in a patient with hepatic steatosis due to decreased penetration of the acoustic ultrasound waves. Electronically Signed   By: Iven Finn M.D.   On: 05/01/2020 04:24     Assessment and Plan:   Abnormal EKG Sinus tachycardia with bouts of SVT - pt has known first degree heart block, RBBB, and LAFB (Tri-fascular block) - EKG in ER shows sinus tachycardia with RBBB with ST changes  - he is not complaining of chest pain - faster heart rates show ST changes - would benefit from controlling HR with IV lopressor - he does not have a lot of reserves for stress of abdominal illness - EK with evolving ST changes suspect ischemia given his disease, tachycardia, and illness - will collect another set of HS troponins   Syncope - two episodes in the last 2 months - dizziness prior to both episodes, was standing  - can't rule out cardiac etiology -  ischemic vs arrhythmia   CAD s/p CABG - heart cath 03/2018 with 3/5 patent grafts - hs troponin is mildly elevated at 28 --> 24 - pt is not complaining of chest pain, primarily abdominal pain - He does have occasional episodes of chest pain, but repeat angiography not pursued given his CKD stage IV - continue medical therapy - would benefit from IV lopressor - please administer 2.5 mg IV lopressor   Chronic systolic and diastolic heart failure - EF 40-45% - lower extremity swelling - will hold off on lasix given vomiting and no PO intake - would be cautious with fluids - BB as above  Hypertension - coreg 6.25 mg BID, 25 mg hydralazine TID, imdur 30 mg daily - NPO now - may need PRN IV hydralazine to control BP until he is taking PO medications again - IV lopressor as above  Hyperlipidemia with LDL goal < 70 06/26/2019: Cholesterol, Total 134; HDL 38; LDL Chol Calc (NIH) 69; Triglycerides 153 - continue present medications when taking PO   DVT (2019) - on reduced dose eliquis - may hold eliquis for possible surgery - will hold off on heparin gtt  Preoperative risk assessment He understands that he is high risk for any procedure. Most conservative procedural approach would be best for this patient. However, we do not plan further evaluation prior to surgery. May hold eliquis, no heparin drip unless CE trend up.   Risk Assessment/Risk Scores:  {  For questions or updates, please contact [place] Please consult [email or web address] for contact info under    Signed, Ledora Bottcher, PA  05/01/2020 8:27 AM  Personally seen and examined. Agree with APP above with the additions made above and following comments: Briefly 87 yo M with CAD s/p CABG, largely with only SVG patency circa 2020; HFmrEF, HTN with DM, CKD Stage IV who presentes with RUQ abdominal pain nausea and vomiting Patient notes that he has had no chest pain; that has had CP prior to some cath but not prior to  CABG.  Notes that he LE swelling is chronic.  Cannot take deep breath without having abdominal pain Exam notable for tachycardia, Bilateral Frank's Sign, CABG scar, +2 pitting edema bilaterally, poor air movement in bases with poor effort abdominal pain on mild palpation; patient also vomited X3 through exam Labs notable for troponin 23/24 but this was with normal heart rates, WBC 11; Creatinine 2 at relatively stable Personally reviewed relevant tests; CXR consistent with some atelectasis; tele suggestive of SR and sinus tachycardia with salvos of SVT with with trivfasciular block (RBBB, 1st HB, LAFB) Would recommend IV BB as above and pain control; sending repeat troponin and I am sure this will be increased, but we presently have little ability to intervene and he is asymptomatic. Patient is high risk because of his multiple comorbidities; but we do not have many options to intervene to mitigate is risk. Unclear if his DVT is still and AC indication; reasonable to hold without heparin.  Helped patient get cleaned up.  He understands the risks and is amenable to surgery if it is offered, based largely one what happed to his ex wife and because this abdominal pain is his primary concern.  Rudean Haskell, MD [place], [address] [place], [place] [address] [phone]  9:50 AM

## 2020-05-01 NOTE — Consult Note (Signed)
Nathan [name] [birth year]  [id number].    Requesting MD: Dr. Lorin Mercy Chief Complaint/Reason for Consult: Upper abdominal pain, n/v with cholelithiasis, elevated wbc and lft's   HPI: Nathan [name] is a 87 y.o. male with a history of CAD s/p CABG x 5 in 2002 w/ last cath in 2020 w/ occluded SVG diagonal and SVG-marginal, CHF (EF 40-45% in 2020), hypertension, hyperlipidemia, diabetes, DVT on Eliquis (last dose last night) who presented to the ED for abdominal pain, nausea, vomiting.  Patient reports yesterday after eating dinner he began having epigastric abdominal pain without radiation that was constant and severe in nature.  He notes associated shortness of breath, nausea as well as several episodes of emesis.  He denies any fever, chills, chest pain, diarrhea or urinary symptoms.  He did not try anything for this at home.  Patient reports he has a history of similar symptoms in the past including when he was diagnosed with cholecystitis in December 2016.  At that time he had a perc chole drain that was placed on 01/21/2015 by IR and removed on 03/25/2015.   In the ED patient was afebrile, tachycardia into the 150s, tachypnea, hypertension and required O2.  WBC 10.8.  Lipase 43.  AST 273.  ALT 112.  T bili 2.0 (direct 1.0).  Chest x-ray showed bibasilar consolidation likely reflecting atelectasis.  Right upper quadrant ultrasound with gallbladder sludge but no gallbladder wall thickening, pericholecystic fluid or Murphy sign.  CBD 5 mm.  He did have hepatic steatosis.  CT A/P showed cholelithiasis with no acute findings or specific cause of his symptoms.  Patient was also noted to have EKG changes and elevated troponins.  [facility] admitted the patient.  Cardiology has seen the patient and started on IV beta-blocker and recommended repeat troponin.  Report he is high risk given his multiple comorbidities if he was to need any operative intervention.. TRH has ordered a HIDA.   Patient reports currently his nausea  has resolved.  His pain has improved slightly to a 5/10 in his epigastrium.  He lives at home alone.  He does not use assistive devices for ambulation.  He notes he has had 2 syncopal episodes over the last 2 months.  He reports he just recently lost his ex-wife in the last 1 month after she underwent surgery.  He denies any tobacco or alcohol use.  He is retired.   ROS: Review of Systems  Constitutional: Negative for chills and fever.  Respiratory: Positive for shortness of breath. Negative for cough.   Cardiovascular: Negative for chest pain and leg swelling.  Gastrointestinal: Positive for abdominal pain, nausea and vomiting. Negative for constipation (last bm yesterday and normal) and diarrhea.  Genitourinary: Negative for dysuria.  Musculoskeletal: Negative for back pain.  Neurological: Positive for loss of consciousness.  Psychiatric/Behavioral: Negative for substance abuse.  All other systems reviewed and are negative.   Family History  Problem Relation Age of Onset  . Heart attack Father   . Hypertension Mother   . Diabetes Brother   . Cancer Sister        breast  . Cancer Brother        throat & mouth    Past Medical History:  Diagnosis Date  . Acid reflux   . Aortic insufficiency   . Cholecystitis   . Combined systolic and diastolic heart failure ([place])    Echo 04/2018: inf-lat HK, EF 40-45, severe basal septal hypertrophy, mild conc LVH, Gr 1 DD, severe  LAE, mild to mod TR, mod AI, asc Aorta 39 mm  . Coronary artery disease 2002   CABG x5  . Diabetes mellitus   . DVT (deep venous thrombosis) ([facility])    previously on coumadin  . Hypercholesteremia   . Hyperlipidemia   . Mitral valve regurgitation   . Skin cancer    tumor removed off of his back    Past Surgical History:  Procedure Laterality Date  . BILIARY DRAINAGE CATHETER PLACEMENT W/ BILE DUCT TUBE CHANGE  01/2015  . CORONARY ARTERY BYPASS GRAFT  12/21/2000   x5  . LEFT HEART CATH AND CORS/GRAFTS  ANGIOGRAPHY N/A 04/06/2018   Procedure: LEFT HEART CATH AND CORS/GRAFTS ANGIOGRAPHY;  Surgeon: Lorretta Harp, MD;  Location: [facility];  Service: Cardiovascular;  Laterality: N/A;  . SKIN CANCER EXCISION    . TUMOR REMOVAL     off of back, skin cancer    Social History:  reports that he has never smoked. He has never used smokeless tobacco. He reports that he does not drink alcohol and does not use drugs.  Allergies: No Known Allergies  (Not in a hospital admission)    Physical Exam: Blood pressure (!) 161/101, pulse (!) 143, temperature 98.7 F (37.1 C), temperature source Oral, resp. rate (!) 29, height 6' (1.829 m), weight 89.8 kg, SpO2 90 %. General: Frail, ill appearing elderly white male who is sitting up in bed HEENT: head is normocephalic, atraumatic.  Sclera are noninjected.  PERRL.  Ears and nose without any masses or lesions.  Mouth is pink and moist. Upper and lower dentures  Heart: Tachycardic with regular ryhthm. No obvious murmurs, gallops, or rubs noted.  Palpable pedal pulses bilaterally  Lungs: Distant breath sounds throughout., No wheezes, rhonchi, or rales noted. Tachypnea noted. On o2.  Abd: Soft, mild distension, epigastric and ruq tenderness without peritonitis, negative murphy's test, +BS. No masses, hernias, or organomegaly MS: no BUE/BLE edema, calves soft and nontender Skin: warm and dry with no masses, lesions, or rashes Psych: A&Ox3 with an appropriate affect Neuro: cranial nerves grossly intact, moves all extremities, gait not assessed, normal speech, thought process intact   Results for orders placed or performed during the hospital encounter of 05/01/20 (from the past 48 hour(s))  Basic metabolic panel     Status: Abnormal   Collection Time: 05/01/20  1:56 AM  Result Value Ref Range   Sodium 138 135 - 145 mmol/L   Potassium 3.6 3.5 - 5.1 mmol/L   Chloride 108 98 - 111 mmol/L   CO2 21 (L) 22 - 32 mmol/L   Glucose, Bld 137 (H) 70 - 99  mg/dL    Comment: Glucose reference range applies only to samples taken after fasting for at least 8 hours.   BUN 35 (H) 8 - 23 mg/dL   Creatinine, Ser 2.24 (H) 0.61 - 1.24 mg/dL   Calcium 8.8 (L) 8.9 - 10.3 mg/dL   GFR, Estimated 28 (L) >60 mL/min    Comment: (NOTE) Calculated using the CKD-EPI Creatinine Equation (2021)    Anion gap 9 5 - 15    Comment: Performed at [facility] [address], [place] [address]  CBC     Status: Abnormal   Collection Time: 05/01/20  1:56 AM  Result Value Ref Range   WBC 10.8 (H) 4.0 - 10.5 K/uL   RBC 4.21 (L) 4.22 - 5.81 MIL/uL   Hemoglobin 12.9 (L) 13.0 - 17.0 g/dL  HCT 38.1 (L) 39.0 - 52.0 %   MCV 90.5 80.0 - 100.0 fL   MCH 30.6 26.0 - 34.0 pg   MCHC 33.9 30.0 - 36.0 g/dL   RDW 13.6 11.5 - 15.5 %   Platelets 250 150 - 400 K/uL   nRBC 0.0 0.0 - 0.2 %    Comment: Performed at [facility], [place] [address] [place], [place] [address]  Troponin I (High Sensitivity)     Status: Abnormal   Collection Time: 05/01/20  1:56 AM  Result Value Ref Range   Troponin I (High Sensitivity) 28 (H) <18 ng/L    Comment: (NOTE) Elevated high sensitivity troponin I (hsTnI) values and significant  changes across serial measurements may suggest ACS but many other  chronic and acute conditions are known to elevate hsTnI results.  Refer to the "Links" section for chest pain algorithms and additional  guidance. Performed at [facility], [place] [address], [place], [place] [address]   Hepatic function panel     Status: Abnormal   Collection Time: 05/01/20  3:04 AM  Result Value Ref Range   Total Protein 6.6 6.5 - 8.1 g/dL   Albumin 3.3 (L) 3.5 - 5.0 g/dL   AST 273 (H) 15 - 41 U/L   ALT 112 (H) 0 - 44 U/L   Alkaline Phosphatase 50 38 - 126 U/L   Total Bilirubin 2.0 (H) 0.3 - 1.2 mg/dL   Bilirubin, Direct 1.0 (H) 0.0 - 0.2 mg/dL   Indirect Bilirubin 1.0 (H) 0.3 - 0.9 mg/dL    Comment: Performed at [facility] [address], [place], [place] [address]  Lipase, blood     Status: None   Collection Time: 05/01/20  3:04 AM  Result Value Ref Range   Lipase 43 11 - 51 U/L    Comment: Performed at [facility] [address] [place], [place] [address]  Troponin I (High Sensitivity)     Status: Abnormal   Collection Time: 05/01/20  4:31 AM  Result Value Ref Range   Troponin I (High Sensitivity) 24 (H) <18 ng/L    Comment: (NOTE) Elevated high sensitivity troponin I (hsTnI) values and significant  changes across serial measurements may suggest ACS but many other  chronic and acute conditions are known to elevate hsTnI results.  Refer to the "Links" section for chest pain algorithms and additional  guidance. Performed at [facility], [place] [address], [place], [place] [address]   Resp Panel by RT-PCR (Flu A&B, Covid) Nasopharyngeal Swab     Status: None   Collection Time: 05/01/20  4:40 AM   Specimen: Nasopharyngeal Swab; Nasopharyngeal(NP) swabs in vial transport medium  Result Value Ref Range   SARS Coronavirus 2 by RT PCR NEGATIVE NEGATIVE    Comment: (NOTE) SARS-CoV-2 target nucleic acids are NOT DETECTED.  The SARS-CoV-2 RNA is generally detectable in upper respiratory specimens during the acute phase of infection. The lowest concentration of SARS-CoV-2 viral copies this assay can detect is 138 copies/mL. A negative result does not preclude SARS-Cov-2 infection and should not be used as the sole basis for treatment or other patient management decisions. A negative result may occur with  improper specimen collection/handling, submission of specimen other than nasopharyngeal swab, presence of viral mutation(s) within the areas targeted by this assay, and inadequate number of viral copies(<138 copies/mL). A negative result must be combined with clinical observations, patient history, and epidemiological information. The expected result  is Negative.  Fact Sheet for Patients:   EntrepreneurPulse.com.au  Fact Sheet for Healthcare Providers:  IncredibleEmployment.be  This test is no t yet approved or cleared by the [place] FDA and  has been authorized for detection and/or diagnosis of SARS-CoV-2 by FDA under an Emergency Use Authorization (EUA). This EUA will remain  in effect (meaning this test can be used) for the duration of the COVID-19 declaration under Section 564(b)(1) of the Act, 21 U.S.C.section 360bbb-3(b)(1), unless the authorization is terminated  or revoked sooner.       Influenza A by PCR NEGATIVE NEGATIVE   Influenza B by PCR NEGATIVE NEGATIVE    Comment: (NOTE) The Xpert Xpress SARS-CoV-2/FLU/RSV plus assay is intended as an aid in the diagnosis of influenza from Nasopharyngeal swab specimens and should not be used as a sole basis for treatment. Nasal washings and aspirates are unacceptable for Xpert Xpress SARS-CoV-2/FLU/RSV testing.  Fact Sheet for Patients: EntrepreneurPulse.com.au  Fact Sheet for Healthcare Providers: IncredibleEmployment.be  This test is not yet approved or cleared by the [place] FDA and has been authorized for detection and/or diagnosis of SARS-CoV-2 by FDA under an Emergency Use Authorization (EUA). This EUA will remain in effect (meaning this test can be used) for the duration of the COVID-19 declaration under Section 564(b)(1) of the Act, 21 U.S.C. section 360bbb-3(b)(1), unless the authorization is terminated or revoked.  Performed at [facility], [place] [address], [place], [place] [address]    CT ABDOMEN PELVIS WO CONTRAST  Result Date: 05/01/2020 CLINICAL DATA:  Acute nonlocalized abdominal pain. Epigastric pain and shortness of breath that began tonight EXAM: CT ABDOMEN AND PELVIS WITHOUT CONTRAST TECHNIQUE: Multidetector CT imaging of the abdomen and pelvis was performed following the standard protocol without IV  contrast. COMPARISON:  10/26/2017 FINDINGS: Lower chest: Coronary atherosclerosis. Prior CABG. Fatty hiatal hernia. Calcified right hilar lymph nodes. Hepatobiliary: No focal liver abnormality.Cholelithiasis. No evidence of acute cholecystitis. Pancreas: Unremarkable. Spleen: Unremarkable. Adrenals/Urinary Tract: Negative adrenals. Symmetric renal atrophy. Renal sinus cysts on both sides. No hydronephrosis. Moderate distension of the urinary bladder with generalized wall thickening. Stomach/Bowel: No obstruction. No visible bowel inflammation. Mild distal colonic diverticulosis. Vascular/Lymphatic: No acute vascular abnormality. Atheromatous calcification of the aorta and branch vessels. No mass or adenopathy. Reproductive:Symmetric enlargement of the prostate which uplifts the bladder base. Other: No ascites or pneumoperitoneum. Shallow fatty right inguinal hernia suggested on coronal reformats. There could be some fat herniation along the upper right femoral ring. Musculoskeletal: No acute abnormalities. L5 chronic pars defects with L5-S1 advanced disc degeneration, anterolisthesis, and severe biforaminal impingement. Spinal degeneration is generalized in the lower thoracic and lumbar spine. IMPRESSION: 1. No acute finding or specific cause of symptoms. 2. Cholelithiasis. 3. Enlarged prostate with findings of chronic outlet obstruction affecting the bladder. 4.  Aortic Atherosclerosis (ICD10-I70.0). Electronically Signed   By: Monte Fantasia M.D.   On: 05/01/2020 05:30   DG Chest 2 View  Result Date: 05/01/2020 CLINICAL DATA:  Epigastric pain, short of breath EXAM: CHEST - 2 VIEW COMPARISON:  02/18/2015, 11/04/2017 FINDINGS: Frontal and lateral views of the chest demonstrates stable postsurgical changes from median sternotomy. The cardiac silhouette is unremarkable. Lung volumes are diminished, with bibasilar consolidation, left greater than right, likely representing atelectasis. No effusion or pneumothorax.  No acute bony abnormalities. IMPRESSION: 1. Low lung volumes, with bibasilar consolidation likely reflecting atelectasis. Electronically Signed   By: Randa Ngo M.D.   On: 05/01/2020 02:16   US Abdomen Limited RUQ (  LIVER/GB)  Result Date: 05/01/2020 CLINICAL DATA:  Right upper quadrant pain. EXAM: ULTRASOUND ABDOMEN LIMITED RIGHT UPPER QUADRANT COMPARISON:  Ultrasound renal 03/18/2020, CT abdomen pelvis 10/26/2017, ultrasound abdomen 09/28/2017 FINDINGS: Gallbladder: Gallbladder sludge. No gallstones. No gallbladder wall thickening or pericholecystic fluid. No sonographic Murphy sign noted by sonographer. Common bile duct: Diameter: 5 mm. Liver: Limited evaluation with no definite focal lesion identified. Slightly increased parenchymal echogenicity. Portal vein is patent on color Doppler imaging with normal direction of blood flow towards the liver. Other: None. IMPRESSION: 1. Gallbladder sludge with no findings of acute cholecystitis or choledocholithiasis. 2. Hepatic steatosis. Please note limited evaluation for focal hepatic masses in a patient with hepatic steatosis due to decreased penetration of the acoustic ultrasound waves. Electronically Signed   By: Iven Finn M.D.   On: 05/01/2020 04:24   Anti-infectives (From admission, onward)   Start     Dose/Rate Route Frequency Ordered Stop   05/01/20 1400  piperacillin-tazobactam (ZOSYN) IVPB 3.375 g        3.375 g 12.5 mL/hr over 240 Minutes Intravenous Every 8 hours 05/01/20 1044        Assessment/Plan CAD s/p CABG x 5 in 2002 w/ last cath in 2020 w/ occluded SVG diagonal and SVG-marginal CHF (EF 40-45% in 2020) Hx of HTN Hx of HLD Hx of diabetes Hx of DVT on Eliquis (last dose last night) -  currently on hold  2 Syncopal episodes at home - reports he lives alone. Defer workup to TRH. May need CM to see Elevated Tn's with EKG changes and tachycardia - Cardiology following. Repeat pending.   Epigastric abdominal pain,  n/v Cholelithiasis  - Hx of perc chole drain that was placed on 01/21/2015 by IR and removed on 03/25/2015.  - WBC 10.8.  Lipase 43.  AST 273.  ALT 112.  T bili 2.0 (direct 1.0).  Right upper quadrant ultrasound with gallbladder sludge but no gallbladder wall thickening, pericholecystic fluid or Murphy sign.  CBD 5 mm.  He did have hepatic steatosis.  CT A/P showed cholelithiasis with no acute findings or specific cause of his symptoms.   - TRH has ordered HIDA and patient is currently undergoing scan. Agree with this plan to r/o cholecystitis. Agree with IV Zosyn in the meantime to cover. If positive would recommend IR perc chole drain given MMP (cards reports patient is high risk). If HIDA negative, would recommend GI workup to determine if patient needs MRCP/further workup in setting of elevated LFT's - We will follow closely with you - Please keep NPO  Jillyn Ledger, [facility] 05/01/2020, 11:21 AM Please see Amion for pager number during day hours 7:00am-4:30pm

## 2020-05-01 NOTE — ED Triage Notes (Signed)
Pt c/o epigastric pain and shortness of breath that started tonight. Denies nausea/vomiting/diarrhea.

## 2020-05-01 NOTE — ED Notes (Signed)
After receiving zofran and morphine, pt has started to vomit. MD made aware.

## 2020-05-01 NOTE — Progress Notes (Signed)
Pharmacy Antibiotic Note  Nathan [name] is a 87 y.o. male admitted on 05/01/2020 with IAI.  Pharmacy has been consulted for zosyn dosing.  Plan: Zosyn 3.375g Iv every 8 hours (extended 4h infusion) Monitor renal function, cholecystectomy plans and LOT  Height: 6' (182.9 cm) Weight: 89.8 kg (198 lb) IBW/kg (Calculated) : 77.6  Temp (24hrs), Avg:98.7 F (37.1 C), Min:98.7 F (37.1 C), Max:98.7 F (37.1 C)  Recent Labs  Lab 05/01/20 0156  WBC 10.8*  CREATININE 2.24*    Estimated Creatinine Clearance: 26.9 mL/min (A) (by C-G formula based on SCr of 2.24 mg/dL (H)).    No Known Allergies  [name], PharmD Clinical Pharmacist ED Pharmacist Phone # [phone] 05/01/2020 10:43 AM

## 2020-05-01 NOTE — ED Provider Notes (Signed)
[name] Provider Note   CSN: [id number] Arrival date & time: 05/01/20  0149     History Chief Complaint  Patient presents with  . Abdominal Pain    Nathan [name] is a 87 y.o. male.  HPI     Is an 87 year old male with a history of coronary artery disease, diabetes, DVT, cholecystitis, hypercholesterolemia who presents with abdominal pain.  Patient reports onset of epigastric abdominal pain around 9 PM.  He also has some shortness of breath.  He states "it just hurts bad."  He cannot characterize the pain for me.  It may feel similar to when he had gallbladder issues.  He states he was supposed to have his gallbladder out but never did.  He has nausea without vomiting.  Denies any recent fevers.  Denies chest pain.  Reports that recently his bowel movements have been "slow."  Patient rates his pain at 10 out of 10.  Past Medical History:  Diagnosis Date  . Acid reflux   . Aortic insufficiency   . Cholecystitis   . Combined systolic and diastolic heart failure ([place])    Echo 04/2018: inf-lat HK, EF 40-45, severe basal septal hypertrophy, mild conc LVH, Gr 1 DD, severe LAE, mild to mod TR, mod AI, asc Aorta 39 mm  . Coronary artery disease 2002   CABG x5  . Diabetes mellitus   . DVT (deep venous thrombosis) (HCC)    previously on coumadin  . Hypercholesteremia   . Hyperlipidemia   . Mitral valve regurgitation   . Skin cancer    tumor removed off of his back    Patient Active Problem List   Diagnosis Date Noted  . Aortic stenosis 03/26/2019  . Chronic venous insufficiency 12/12/2018  . Pain due to onychomycosis of toenails of both feet 08/11/2018  . Coagulation disorder ([place]) 08/11/2018  . Chronic combined systolic and diastolic CHF (congestive heart failure) ([place]) 05/11/2018  . CKD (chronic kidney disease) stage 3, GFR 30-59 ml/min (HCC) 05/11/2018  . Essential hypertension 11/11/2016  . Pneumonia 01/23/2015  . Hypoxia 01/22/2015   . Elevated troponin 01/22/2015  . Acute cholecystitis 01/21/2015  . S/P CABG (coronary artery bypass graft) 01/21/2015  . Diabetes mellitus type 2 with complications ([place]) 40/98/1191  . Sepsis ([place])   . Valvular heart disease 09/29/2011  . Syncope 01/21/2011  . Coronary artery disease   . Aortic insufficiency   . Hypercholesteremia   . Mitral valve regurgitation     Past Surgical History:  Procedure Laterality Date  . BILIARY DRAINAGE CATHETER PLACEMENT W/ BILE DUCT TUBE CHANGE  01/2015  . CORONARY ARTERY BYPASS GRAFT  12/21/2000   x5  . LEFT HEART CATH AND CORS/GRAFTS ANGIOGRAPHY N/A 04/06/2018   Procedure: LEFT HEART CATH AND CORS/GRAFTS ANGIOGRAPHY;  Surgeon: Lorretta Harp, MD;  Location: [facility];  Service: Cardiovascular;  Laterality: N/A;  . SKIN CANCER EXCISION    . TUMOR REMOVAL     off of back, skin cancer       Family History  Problem Relation Age of Onset  . Heart attack Father   . Hypertension Mother   . Diabetes Brother   . Cancer Sister        breast  . Cancer Brother        throat & mouth    Social History   Tobacco Use  . Smoking status: Never Smoker  . Smokeless tobacco: Never Used  Vaping Use  . Vaping  Use: Never used  Substance Use Topics  . Alcohol use: No  . Drug use: No    Home Medications Prior to Admission medications   Medication Sig Start Date End Date Taking? Authorizing Provider  albuterol (VENTOLIN HFA) 108 (90 Base) MCG/ACT inhaler Inhale into the lungs. 11/13/19   [provider]  apixaban (ELIQUIS) 2.5 MG TABS tablet Take 2.5 mg by mouth 2 (two) times daily.    [provider]  atorvastatin (LIPITOR) 40 MG tablet Take 40 mg by mouth daily. 02/02/15   [provider]  Blood Glucose Monitoring Suppl (ONETOUCH VERIO) w/Device KIT  09/06/17   [provider]  carvedilol (COREG) 6.25 MG tablet TAKE 1 TABLET BY MOUTH EVERY DAY 04/01/20   Nahser, Wonda Cheng, MD  fenofibrate (TRICOR) 145 MG  tablet Take 1 tablet (145 mg total) by mouth daily. 03/21/20   Nahser, Wonda Cheng, MD  FLUZONE HIGH-DOSE QUADRIVALENT 0.7 ML SUSY  12/06/19   [provider]  hydrALAZINE (APRESOLINE) 25 MG tablet Take 1 tablet (25 mg total) by mouth 2 (two) times daily. 03/21/20   Nahser, Wonda Cheng, MD  insulin NPH-regular Human (NOVOLIN 70/30) (70-30) 100 UNIT/ML injection Inject 40 Units into the skin See admin instructions. 40 units in the morning, 25 units at dinner    [provider]  isosorbide mononitrate (IMDUR) 30 MG 24 hr tablet Take 1 tablet (30 mg total) by mouth daily. 07/10/19   Nahser, Wonda Cheng, MD  Lancets Glory Rosebush ULTRASOFT) lancets  09/06/17   [provider]  Multiple Vitamins-Minerals (CENTRUM SILVER PO) Take 1 tablet by mouth daily.    [provider]  nitroGLYCERIN (NITROSTAT) 0.4 MG SL tablet Place 1 tablet (0.4 mg total) under the tongue every 5 (five) minutes as needed for chest pain. 04/04/18   Richardson Dopp T, PA-C  Omega-3 Fatty Acids (FISH OIL) 1200 MG CAPS Take 1,200 mg by mouth 2 (two) times daily.    [provider]  omeprazole (PRILOSEC) 20 MG capsule Take 20 mg by mouth 2 (two) times daily.  01/06/11   [provider]  ONETOUCH VERIO test strip  09/06/17   [provider]  [place] 1ML/31G 31G X 5/16" 1 ML MISC USE 1 TWICE DAILY AS DIRECTED 09/29/17   [provider]  TOLAK 4 % CREA Apply to areas qhs x 3weeks 01/25/20   [provider]    Allergies    Patient has no known allergies.  Review of Systems   Review of Systems  Constitutional: Negative for fever.  Respiratory: Positive for shortness of breath. Negative for cough.   Cardiovascular: Negative for chest pain.  Gastrointestinal: Positive for abdominal pain and nausea. Negative for constipation, diarrhea and vomiting.  Genitourinary: Negative for dysuria.  All other systems reviewed and are negative.   Physical Exam Updated Vital  Signs BP (!) 173/86   Pulse 89   Temp 98.7 F (37.1 C) (Oral)   Resp (!) 31   Ht 1.829 m (6')   Wt 89.8 kg   SpO2 94%   BMI 26.85 kg/m   Physical Exam Vitals and nursing note reviewed.  Constitutional:      Appearance: He is well-developed. He is not ill-appearing.     Comments: Uncomfortable appearing but nontoxic  HENT:     Head: Normocephalic and atraumatic.  Eyes:     Pupils: Pupils are equal, round, and reactive to light.  Cardiovascular:     Rate and Rhythm: Normal rate  and regular rhythm.     Heart sounds: Normal heart sounds. No murmur heard.   Pulmonary:     Effort: Pulmonary effort is normal. No respiratory distress.     Breath sounds: Normal breath sounds. No wheezing.  Abdominal:     General: Bowel sounds are normal.     Palpations: Abdomen is soft.     Tenderness: There is abdominal tenderness in the right upper quadrant and epigastric area. There is no guarding or rebound.  Genitourinary:    Testes:        Right: Swelling not present.        Left: Swelling not present.  Musculoskeletal:     Cervical back: Neck supple.  Lymphadenopathy:     Cervical: No cervical adenopathy.  Skin:    General: Skin is warm and dry.  Neurological:     Mental Status: He is alert and oriented to person, place, and time.  Psychiatric:        Mood and Affect: Mood normal.     ED Results / Procedures / Treatments   Labs (all labs ordered are listed, but only abnormal results are displayed) Labs Reviewed  BASIC METABOLIC PANEL - Abnormal; Notable for the following components:      Result Value   CO2 21 (*)    Glucose, Bld 137 (*)    BUN 35 (*)    Creatinine, Ser 2.24 (*)    Calcium 8.8 (*)    GFR, Estimated 28 (*)    All other components within normal limits  CBC - Abnormal; Notable for the following components:   WBC 10.8 (*)    RBC 4.21 (*)    Hemoglobin 12.9 (*)    HCT 38.1 (*)    All other components within normal limits  HEPATIC FUNCTION PANEL - Abnormal;  Notable for the following components:   Albumin 3.3 (*)    AST 273 (*)    ALT 112 (*)    Total Bilirubin 2.0 (*)    Bilirubin, Direct 1.0 (*)    Indirect Bilirubin 1.0 (*)    All other components within normal limits  TROPONIN I (HIGH SENSITIVITY) - Abnormal; Notable for the following components:   Troponin I (High Sensitivity) 28 (*)    All other components within normal limits  TROPONIN I (HIGH SENSITIVITY) - Abnormal; Notable for the following components:   Troponin I (High Sensitivity) 24 (*)    All other components within normal limits  RESP PANEL BY RT-PCR (FLU A&B, COVID) [id number]  LIPASE, BLOOD    EKG EKG Interpretation  Date/Time:  Thursday May 01 2020 01:56:20 EDT Ventricular Rate:  67 PR Interval:  250 QRS Duration: 156 QT Interval:  466 QTC Calculation: 492 R Axis:   -49 Text Interpretation: Sinus rhythm with 1st degree A-V block Right bundle branch block Left anterior fascicular block  Bifascicular block  Minimal voltage criteria for LVH, may be normal variant ( R in aVL ) Inferior infarct , age undetermined Abnormal ECG INferior Q waves new when compared to prior Confirmed by Thayer Jew [phone]) on 05/01/2020 3:35:16 AM   Radiology CT ABDOMEN PELVIS WO CONTRAST  Result Date: 05/01/2020 CLINICAL DATA:  Acute nonlocalized abdominal pain. Epigastric pain and shortness of breath that began tonight EXAM: CT ABDOMEN AND PELVIS WITHOUT CONTRAST TECHNIQUE: Multidetector CT imaging of the abdomen and pelvis was performed following the standard protocol without IV contrast. COMPARISON:  10/26/2017 FINDINGS: Lower chest: Coronary atherosclerosis. Prior CABG. Fatty hiatal hernia. Calcified right  hilar lymph nodes. Hepatobiliary: No focal liver abnormality.Cholelithiasis. No evidence of acute cholecystitis. Pancreas: Unremarkable. Spleen: Unremarkable. Adrenals/Urinary Tract: Negative adrenals. Symmetric renal atrophy. Renal sinus cysts on both sides. No hydronephrosis.  Moderate distension of the urinary bladder with generalized wall thickening. Stomach/Bowel: No obstruction. No visible bowel inflammation. Mild distal colonic diverticulosis. Vascular/Lymphatic: No acute vascular abnormality. Atheromatous calcification of the aorta and branch vessels. No mass or adenopathy. Reproductive:Symmetric enlargement of the prostate which uplifts the bladder base. Other: No ascites or pneumoperitoneum. Shallow fatty right inguinal hernia suggested on coronal reformats. There could be some fat herniation along the upper right femoral ring. Musculoskeletal: No acute abnormalities. L5 chronic pars defects with L5-S1 advanced disc degeneration, anterolisthesis, and severe biforaminal impingement. Spinal degeneration is generalized in the lower thoracic and lumbar spine. IMPRESSION: 1. No acute finding or specific cause of symptoms. 2. Cholelithiasis. 3. Enlarged prostate with findings of chronic outlet obstruction affecting the bladder. 4.  Aortic Atherosclerosis (ICD10-I70.0). Electronically Signed   By: Monte Fantasia M.D.   On: 05/01/2020 05:30   DG Chest 2 View  Result Date: 05/01/2020 CLINICAL DATA:  Epigastric pain, short of breath EXAM: CHEST - 2 VIEW COMPARISON:  02/18/2015, 11/04/2017 FINDINGS: Frontal and lateral views of the chest demonstrates stable postsurgical changes from median sternotomy. The cardiac silhouette is unremarkable. Lung volumes are diminished, with bibasilar consolidation, left greater than right, likely representing atelectasis. No effusion or pneumothorax. No acute bony abnormalities. IMPRESSION: 1. Low lung volumes, with bibasilar consolidation likely reflecting atelectasis. Electronically Signed   By: Randa Ngo M.D.   On: 05/01/2020 02:16   US Abdomen Limited RUQ (LIVER/GB)  Result Date: 05/01/2020 CLINICAL DATA:  Right upper quadrant pain. EXAM: ULTRASOUND ABDOMEN LIMITED RIGHT UPPER QUADRANT COMPARISON:  Ultrasound renal 03/18/2020, CT abdomen  pelvis 10/26/2017, ultrasound abdomen 09/28/2017 FINDINGS: Gallbladder: Gallbladder sludge. No gallstones. No gallbladder wall thickening or pericholecystic fluid. No sonographic Murphy sign noted by sonographer. Common bile duct: Diameter: 5 mm. Liver: Limited evaluation with no definite focal lesion identified. Slightly increased parenchymal echogenicity. Portal vein is patent on color Doppler imaging with normal direction of blood flow towards the liver. Other: None. IMPRESSION: 1. Gallbladder sludge with no findings of acute cholecystitis or choledocholithiasis. 2. Hepatic steatosis. Please note limited evaluation for focal hepatic masses in a patient with hepatic steatosis due to decreased penetration of the acoustic ultrasound waves. Electronically Signed   By: Iven Finn M.D.   On: 05/01/2020 04:24    Procedures Procedures   Medications Ordered in ED Medications  morphine 4 MG/ML injection 4 mg (0 mg Intravenous Hold 05/01/20 0537)  pantoprazole (PROTONIX) injection 40 mg (has no administration in time range)  morphine 4 MG/ML injection 4 mg (4 mg Intravenous Given 05/01/20 0314)  ondansetron (ZOFRAN) injection 4 mg (4 mg Intravenous Given 05/01/20 0314)  sodium chloride 0.9 % bolus 500 mL (500 mLs Intravenous New Bag/Given 05/01/20 0318)  ondansetron (ZOFRAN) injection 4 mg (4 mg Intravenous Given 05/01/20 0438)    ED Course  I have reviewed the triage vital signs and the nursing notes.  Pertinent labs & imaging results that were available during my care of the patient were reviewed by me and considered in my medical decision making (see chart for details).  Clinical Course as of 05/01/20 0617  Thu May 01, 2020  0611 Patient still having some pain.  Continues to have recurrent emesis.  He was redosed medications.  LFTs elevated mid range with an AST of 273 and an ALT  of 112.  Slight leukocytosis to 10.8.  Lipase is normal.  Troponin stable at 28 and 24.  This is in the setting of  chronic kidney disease.  Doubt ACS.  Right upper quadrant ultrasound shows biliary sludge but no evidence of cholecystitis.  CT scan again shows cholelithiasis. [CH]  6834 General surgery consulted.  Patient has previously had a normal HIDA scan in 2019.  Question whether he needs repeat scan.  May also need endoscopy given ongoing symptoms. [CH]  [phone] Spoke with general surgery, Dr. Dema Severin.  States that HIDA scan would be helpful to evaluate for lodged stone.  If scan positive, surgery can be formally consulted [CH]    Clinical Course User Index [CH] Thedford Bunton, Barbette Hair, MD   MDM Rules/Calculators/A&P                          Patient presents with abdominal pain.  Is uncomfortable appearing but nontoxic on exam.  Vital signs notable for blood pressure of 173/86.  History of gallbladder disease in the past.  Denies alcohol or drug use.  Other considerations include but not limited to, pancreatitis, gastritis, peptic ulcer, SBO, less likely appendicitis.  Patient given pain and nausea medication as well as fluids.  EKG shows new Q waves inferiorly but no acute ST elevation or ischemia.  Troponin initially 28.  This is in the setting of chronic kidney disease.  Repeat troponin XX 4.  Doubt ACS.  See clinical course above.  Patient had right upper quadrant ultrasound imaging and CT imaging that just shows cholelithiasis.  However, he has marginally elevated LFTs and bilirubin.  Discussed with general surgery.  Recommend HIDA scan.  He has ongoing pain and vomiting.  For this reason, will admit to the hospitalist for ongoing work-up.  Other consideration would be potentially needing an EGD for evaluation for possible ulcer or gastritis.  He was given IV Protonix.  Final Clinical Impression(s) / ED Diagnoses Final diagnoses:  RUQ pain  Right upper quadrant abdominal pain  Calculus of gallbladder with biliary obstruction but without cholecystitis  Elevated LFTs    Rx / DC Orders ED Discharge Orders     None       Merryl Hacker, MD 05/01/20 [phone]

## 2020-05-01 NOTE — ED Notes (Signed)
Pt O2 down to 82%. Pt placed on 4L [place]. O2 92%.

## 2020-05-01 NOTE — Consult Note (Signed)
NAME:  Nathan [name], MRN:  [id number], DOB:  Mar 12, 1933, LOS: 0 ADMISSION DATE:  05/01/2020, CONSULTATION DATE:  05/01/20 REFERRING MD:  [facility]), CHIEF COMPLAINT:  SIRS   History of Present Illness:  87yo male with hx CAD s/p CABG 2005, DVT on eliquis, combined CHF (EF 40-45%) and previous hx cholecystitis in 2016 rx with perc drain. Presents 3/17 with abd pain, nausea, vomiting. In ER found to be tachycardic, SCr 2.24, WBC 10.8, AST 273/ALT 112, CT abd with cholelithiasis but no acute findings, HIDA scan concerning for high-grade bile duct obstruction.  Surgery and GI consulted.  PCCM consulted to assess need for ICU admission.   Pertinent  Medical History   has a past medical history of Acid reflux, Aortic insufficiency, Cholecystitis, Combined systolic and diastolic heart failure ([place]), Coronary artery disease (2002), Diabetes mellitus, DVT (deep venous thrombosis) ([place]), Hypercholesteremia, Hyperlipidemia, Mitral valve regurgitation, and Skin cancer.   Significant Hospital Events: Including procedures, antibiotic start and stop dates in addition to other pertinent events   .   Interim History / Subjective:  C/o epigastric pain, nausea resolved. Denies chest pain, SOB.  Feeling somewhat better. Afebrile.  Hemodynamically stable  Objective   Blood pressure 109/78, pulse (!) 133, temperature 98.7 F (37.1 C), temperature source Oral, resp. rate (!) 23, height 6' (1.829 m), weight 89.8 kg, SpO2 93 %.        Intake/Output Summary (Last 24 hours) at 05/01/2020 1454 Last data filed at 05/01/2020 7322 Gross per 24 hour  Intake 500 ml  Output -  Net 500 ml   Filed Weights   05/01/20 0228  Weight: 89.8 kg    Examination: General: pleasant elderly male, NAD  HENT: mm dry, no JVD Lungs: resps even non labored on 2L [place], clear Cardiovascular: s1s2 rrr, tachy Abdomen: soft, tender diffusely, hypoactive BS Extremities: warm and dry, no edema  Neuro: awake, alert, appropriate     Labs/imaging personally reviewed   CT abd/pelvis with cholelithiasis HIDA scan concerning for high-grade bile duct obstruction Scr 2.2 (baseline ~2), wbc 10.8, hgb 12.9, LFT's  Resolved Hospital Problem list     Assessment & Plan:   SIRS r/t suspected cholecystitis, cholelithiasis - Not hypotensive, hemodynamically stable  Demand ischemia with mild troponin bump Hx CAD, CHF  CKD   PLAN -  Surgery and GI following  Cardiology following  Continue gentle volume  Pain mgmt  abx per primary  ?perc drain +/- MRCP  No current indication for ICU admission  Admit per [facility]  PCCM available PRN  *confirmed DNR with pt myself at bedside. No intubation, no CPR, no defib.     Labs   CBC: Recent Labs  Lab 05/01/20 0156  WBC 10.8*  HGB 12.9*  HCT 38.1*  MCV 90.5  PLT 025    Basic Metabolic Panel: Recent Labs  Lab 05/01/20 0156  NA 138  K 3.6  CL 108  CO2 21*  GLUCOSE 137*  BUN 35*  CREATININE 2.24*  CALCIUM 8.8*   GFR: Estimated Creatinine Clearance: 26.9 mL/min (A) (by C-G formula based on SCr of 2.24 mg/dL (H)). Recent Labs  Lab 05/01/20 0156  WBC 10.8*    Liver Function Tests: Recent Labs  Lab 05/01/20 0304  AST 273*  ALT 112*  ALKPHOS 50  BILITOT 2.0*  PROT 6.6  ALBUMIN 3.3*   Recent Labs  Lab 05/01/20 0304  LIPASE 43   No results for input(s): AMMONIA in the last 168 hours.  ABG  Component Value Date/Time   TCO2 25 02/19/2015 0324     Coagulation Profile: No results for input(s): INR, PROTIME in the last 168 hours.  Cardiac Enzymes: No results for input(s): CKTOTAL, CKMB, CKMBINDEX, TROPONINI in the last 168 hours.  HbA1C: Hgb A1c MFr Bld  Date/Time Value Ref Range Status  05/01/2020 01:53 PM 7.9 (H) 4.8 - 5.6 % Final    Comment:    (NOTE) Pre diabetes:          5.7%-6.4%  Diabetes:              >6.4%  Glycemic control for   <7.0% adults with diabetes     CBG: Recent Labs  Lab 05/01/20 1309  GLUCAP 129*     Review of Systems:   As per HPI - All other systems reviewed and were neg.     Past Medical History:  He,  has a past medical history of Acid reflux, Aortic insufficiency, Cholecystitis, Combined systolic and diastolic heart failure ([place]), Coronary artery disease (2002), Diabetes mellitus, DVT (deep venous thrombosis) ([place]), Hypercholesteremia, Hyperlipidemia, Mitral valve regurgitation, and Skin cancer.   Surgical History:   Past Surgical History:  Procedure Laterality Date  . BILIARY DRAINAGE CATHETER PLACEMENT W/ BILE DUCT TUBE CHANGE  01/2015  . CORONARY ARTERY BYPASS GRAFT  12/21/2000   x5  . LEFT HEART CATH AND CORS/GRAFTS ANGIOGRAPHY N/A 04/06/2018   Procedure: LEFT HEART CATH AND CORS/GRAFTS ANGIOGRAPHY;  Surgeon: Lorretta Harp, MD;  Location: [facility];  Service: Cardiovascular;  Laterality: N/A;  . SKIN CANCER EXCISION    . TUMOR REMOVAL     off of back, skin cancer     Social History:   reports that he has never smoked. He has never used smokeless tobacco. He reports that he does not drink alcohol and does not use drugs.   Family History:  His family history includes Cancer in his brother and sister; Diabetes in his brother; Heart attack in his father; Hypertension in his mother.   Allergies No Known Allergies   Home Medications  Prior to Admission medications   Medication Sig Start Date End Date Taking? Authorizing Provider  albuterol (VENTOLIN HFA) 108 (90 Base) MCG/ACT inhaler Inhale 1-2 puffs into the lungs every 6 (six) hours as needed for shortness of breath or wheezing. 11/13/19  Yes [provider]  apixaban (ELIQUIS) 2.5 MG TABS tablet Take 2.5 mg by mouth 2 (two) times daily.   Yes [provider]  atorvastatin (LIPITOR) 40 MG tablet Take 40 mg by mouth daily. 02/02/15  Yes [provider]  Blood Glucose Monitoring Suppl (ONETOUCH VERIO) w/Device KIT  09/06/17  Yes [provider]  carvedilol (COREG) 6.25  MG tablet TAKE 1 TABLET BY MOUTH EVERY DAY Patient taking differently: Take 6.25 mg by mouth daily. 04/01/20  Yes Nahser, Wonda Cheng, MD  fenofibrate (TRICOR) 145 MG tablet Take 1 tablet (145 mg total) by mouth daily. 03/21/20  Yes Nahser, Wonda Cheng, MD  guaifenesin (ROBITUSSIN) 100 MG/5ML syrup Take 200 mg by mouth 3 (three) times daily as needed for cough or congestion.   Yes [provider]  hydrALAZINE (APRESOLINE) 25 MG tablet Take 1 tablet (25 mg total) by mouth 2 (two) times daily. 03/21/20  Yes Nahser, Wonda Cheng, MD  insulin NPH-regular Human (NOVOLIN 70/30) (70-30) 100 UNIT/ML injection Inject 20-35 Units into the skin See admin instructions. 35 units in the morning, 20 units at dinner   Yes [provider]  isosorbide mononitrate (IMDUR) 30 MG 24 hr tablet Take 1 tablet (30 mg total) by mouth daily. 07/10/19  Yes Nahser, Wonda Cheng, MD  Lancets Glory Rosebush ULTRASOFT) lancets  09/06/17  Yes [provider]  Multiple Vitamins-Minerals (CENTRUM SILVER PO) Take 1 tablet by mouth daily.   Yes [provider]  nitroGLYCERIN (NITROSTAT) 0.4 MG SL tablet Place 1 tablet (0.4 mg total) under the tongue every 5 (five) minutes as needed for chest pain. 04/04/18  Yes Weaver, Scott T, PA-C  Omega-3 Fatty Acids (FISH OIL) 1200 MG CAPS Take 1,200 mg by mouth 2 (two) times daily.   Yes [provider]  omeprazole (PRILOSEC) 20 MG capsule Take 20 mg by mouth 2 (two) times daily.  01/06/11  Yes [provider]  ONETOUCH VERIO test strip  09/06/17  Yes [provider]  [place] 1ML/31G 31G X 5/16" 1 ML MISC USE 1 TWICE DAILY AS DIRECTED 09/29/17  Yes [provider]  tamsulosin (FLOMAX) 0.4 MG CAPS capsule Take 0.4 mg by mouth at bedtime. 03/11/20  Yes [provider]  TOLAK 4 % CREA Apply to areas qhs x 3weeks 01/25/20  Yes [provider]     Nickolas Madrid, NP Pulmonary/Critical Care Medicine  05/01/2020  2:54  PM

## 2020-05-01 NOTE — Consult Note (Addendum)
[facility] Consult: 2:34 PM 05/01/2020  LOS: 0 days    Referring Provider: Dr Lorin Mercy  Primary Care Physician:  Leanna Battles, MD Primary Gastroenterologist:  [facility]     Reason for Consultation: Elevated LFTs   HPI: Nathan [name] is a 87 y.o. male.  PMH of DVT, on Eliquis.  CAD.  5 vessel CABG 2002.  2020 cardiac catheterization with LVEF 40 to 45%.  Occluded SVG diagonal and SVG to marginal vessels.  Diabetes. Patient had acute cholecystitis in 01/2015 treated with IR drain placement removed in 03/2015. Last night after eating dinner developed epigastric pain.  No improvement after taking Rolaids.  Pain located in the periumbilical region but today has radiated up into the left upper quadrant.  Started vomiting this morning, nonbloody emesis.  Had some difficulty breathing because deep breathing triggered worsening abdominal pain.   No chest pressure or pain.  No fevers, chills, angina, diarrhea.  Symptoms very similar to when he had cholecystitis in 2016.  No fevers.  No hypotension in fact hypertension at times to 161/122.  Heart rates as high as the 140s for T bili 2.  Alk phos 50.  AST/ALT 273/112.  Lipase 43. BUN/creatinine 35/2.2.  Normal sodium, potassium. High-sensitivity troponin 28, 24, 305. WBCs 10.8.  Hb 12.9.  Platelets 250.  MCV 90.5. Nominal ultrasound: Gallbladder sludge without cholecystitis.  No cholelithiasis.  CBD 5 mm.  Fatty liver.  PV Dopplers normal. CTAP w/o contrast: Cholelithiasis.  Enlarged prostate with changes consistent with chronic outlet obstruction affecting the bladder.  Aortic atherosclerosis. HIDA scan.  Radiotracer activity in the liver but no biliary activity after 1 hour.  Gallbladder activity not visualized.  Differential includes hepatocellular dysfunction from hepatitis  vs high-grade bile duct obstruction.  Surgery saw the patient late this morning   Lives in his home alone.  Retired.  No alcohol or tobacco.   Past Medical History:  Diagnosis Date   Acid reflux    Aortic insufficiency    Cholecystitis    Combined systolic and diastolic heart failure ([place])    Echo 04/2018: inf-lat HK, EF 40-45, severe basal septal hypertrophy, mild conc LVH, Gr 1 DD, severe LAE, mild to mod TR, mod AI, asc Aorta 39 mm   Coronary artery disease 2002   CABG x5   Diabetes mellitus    DVT (deep venous thrombosis) (HCC)    previously on coumadin   Hypercholesteremia    Hyperlipidemia    Mitral valve regurgitation    Skin cancer    tumor removed off of his back    Past Surgical History:  Procedure Laterality Date   BILIARY DRAINAGE CATHETER PLACEMENT W/ BILE DUCT TUBE CHANGE  01/2015   CORONARY ARTERY BYPASS GRAFT  12/21/2000   x5   LEFT HEART CATH AND CORS/GRAFTS ANGIOGRAPHY N/A 04/06/2018   Procedure: LEFT HEART CATH AND CORS/GRAFTS ANGIOGRAPHY;  Surgeon: Lorretta Harp, MD;  Location: [facility];  Service: Cardiovascular;  Laterality: N/A;   SKIN CANCER EXCISION     TUMOR REMOVAL     off  of back, skin cancer    Prior to Admission medications   Medication Sig Start Date End Date Taking? Authorizing Provider  albuterol (VENTOLIN HFA) 108 (90 Base) MCG/ACT inhaler Inhale 1-2 puffs into the lungs every 6 (six) hours as needed for shortness of breath or wheezing. 11/13/19  Yes [provider]  apixaban (ELIQUIS) 2.5 MG TABS tablet Take 2.5 mg by mouth 2 (two) times daily.   Yes [provider]  atorvastatin (LIPITOR) 40 MG tablet Take 40 mg by mouth daily. 02/02/15  Yes [provider]  Blood Glucose Monitoring Suppl (ONETOUCH VERIO) w/Device KIT  09/06/17  Yes [provider]  carvedilol (COREG) 6.25 MG tablet TAKE 1 TABLET BY MOUTH EVERY DAY Patient taking differently: Take 6.25 mg by mouth daily.  04/01/20  Yes Nahser, Wonda Cheng, MD  fenofibrate (TRICOR) 145 MG tablet Take 1 tablet (145 mg total) by mouth daily. 03/21/20  Yes Nahser, Wonda Cheng, MD  guaifenesin (ROBITUSSIN) 100 MG/5ML syrup Take 200 mg by mouth 3 (three) times daily as needed for cough or congestion.   Yes [provider]  hydrALAZINE (APRESOLINE) 25 MG tablet Take 1 tablet (25 mg total) by mouth 2 (two) times daily. 03/21/20  Yes Nahser, Wonda Cheng, MD  insulin NPH-regular Human (NOVOLIN 70/30) (70-30) 100 UNIT/ML injection Inject 20-35 Units into the skin See admin instructions. 35 units in the morning, 20 units at dinner   Yes [provider]  isosorbide mononitrate (IMDUR) 30 MG 24 hr tablet Take 1 tablet (30 mg total) by mouth daily. 07/10/19  Yes Nahser, Wonda Cheng, MD  Lancets Glory Rosebush ULTRASOFT) lancets  09/06/17  Yes [provider]  Multiple Vitamins-Minerals (CENTRUM SILVER PO) Take 1 tablet by mouth daily.   Yes [provider]  nitroGLYCERIN (NITROSTAT) 0.4 MG SL tablet Place 1 tablet (0.4 mg total) under the tongue every 5 (five) minutes as needed for chest pain. 04/04/18  Yes Weaver, Scott T, PA-C  Omega-3 Fatty Acids (FISH OIL) 1200 MG CAPS Take 1,200 mg by mouth 2 (two) times daily.   Yes [provider]  omeprazole (PRILOSEC) 20 MG capsule Take 20 mg by mouth 2 (two) times daily.  01/06/11  Yes [provider]  ONETOUCH VERIO test strip  09/06/17  Yes [provider]  [place] 1ML/31G 31G X 5/16" 1 ML MISC USE 1 TWICE DAILY AS DIRECTED 09/29/17  Yes [provider]  tamsulosin (FLOMAX) 0.4 MG CAPS capsule Take 0.4 mg by mouth at bedtime. 03/11/20  Yes [provider]  TOLAK 4 % CREA Apply to areas qhs x 3weeks 01/25/20  Yes [provider]    Scheduled Meds:  insulin aspart  0-15 Units Subcutaneous TID WC   insulin aspart  0-5 Units Subcutaneous QHS   Infusions:  lactated ringers 75 mL/hr at 05/01/20 1332    piperacillin-tazobactam (ZOSYN)  IV 3.375 g (05/01/20 1340)   PRN Meds: acetaminophen **OR** acetaminophen, hydrALAZINE, metoprolol tartrate, morphine injection, ondansetron **OR** ondansetron (ZOFRAN) IV   Allergies as of 05/01/2020   (No Known Allergies)    Family History  Problem Relation Age of Onset   Heart attack Father    Hypertension Mother    Diabetes Brother    Cancer Sister        breast   Cancer Brother        throat & mouth    Social History   Socioeconomic History   Marital status: Divorced    Spouse name: Not  on file   Number of children: Not on file   Years of education: Not on file   Highest education level: Not on file  Occupational History   Not on file  Tobacco Use   Smoking status: Never Smoker   Smokeless tobacco: Never Used  Vaping Use   Vaping Use: Never used  Substance and Sexual Activity   Alcohol use: No   Drug use: No   Sexual activity: Not Currently  Other Topics Concern   Not on file  Social History Narrative   Not on file   Social Determinants of Health   Financial Resource Strain: Not on file  Food Insecurity: Not on file  Transportation Needs: Not on file  Physical Activity: Not on file  Stress: Not on file  Social Connections: Not on file  Intimate Partner Violence: Not on file    REVIEW OF SYSTEMS: Constitutional: No fatigue or weakness ENT:  No nose bleeds Pulm: Significant dyspnea or cough CV:  No palpitations, no LE edema.  Angina GU:  No hematuria, no frequency GI: See HPI.  Last bowel movement was brown midday yesterday. Heme: Denies unusual or excessive bleeding or bruising Transfusions: None Neuro:  No headaches, no peripheral tingling or numbness.  No syncope, no seizure Derm:  No itching, no rash or sores.  Endocrine:  No sweats or chills.  No polyuria or dysuria Immunization: Not queried.   PHYSICAL EXAM: Vital signs in last 24 hours: Vitals:   05/01/20 1200 05/01/20 1415  BP:  97/63 109/78  Pulse: (!) 139 (!) 133  Resp: (!) 21 (!) 23  Temp:    SpO2: 96% 93%   Wt Readings from Last 3 Encounters:  05/01/20 89.8 kg  03/21/20 88.8 kg  03/26/19 88.9 kg    General: Aged, somewhat frail but alert, comfortable, just received IV pain meds. Head: No asymmetry or signs of head trauma. Eyes: Slight scleral icterus.  No conjunctival pallor. Ears: No obvious hearing deficit Nose: No congestion or discharge Mouth: Moist mucosa, clear.  Tongue midline.  Full dentures in place Neck: No JVD, no masses, no thyromegaly. Lungs: Fine rales at bases otherwise clear.  No cough or labored breathing Heart: RRR.  No MRG.  S1, S2 present Abdomen: Soft.  Not tender, again he just got pain meds.  No HSM, masses, bruits, hernias..   Rectal: Deferred Musc/Skeltl: No joint redness, swelling.  Some arthritic changes in the hands fingers Extremities: No CCE Neurologic: Alert.  Oriented x3.  Good historian.  Moves all 4 limbs without gross weakness or tremors. Skin: No jaundice.  No telangiectasia.  No significant purpura or bruising. Nodes: No cervical adenopathy Psych: Calm, pleasant, cooperative.  Intake/Output from previous day: 03/16 0701 - 03/17 0700 In: 500 [IV Piggyback:500] Out: -  Intake/Output this shift: No intake/output data recorded.  LAB RESULTS: Recent Labs    05/01/20 0156  WBC 10.8*  HGB 12.9*  HCT 38.1*  PLT 250   BMET Lab Results  Component Value Date   NA 138 05/01/2020   NA 142 07/03/2019   NA 142 06/26/2019   K 3.6 05/01/2020   K 4.3 07/03/2019   K 4.0 06/26/2019   CL 108 05/01/2020   CL 112 (H) 07/03/2019   CL 105 06/26/2019   CO2 21 (L) 05/01/2020   CO2 18 (L) 07/03/2019   CO2 20 06/26/2019   GLUCOSE 137 (H) 05/01/2020   GLUCOSE 75 07/03/2019   GLUCOSE 100 (H) 06/26/2019   BUN 35 (H)  05/01/2020   BUN 37 (H) 07/03/2019   BUN 51 (H) 06/26/2019   CREATININE 2.24 (H) 05/01/2020   CREATININE 2.38 (H) 07/03/2019   CREATININE 2.93 (H)  06/26/2019   CALCIUM 8.8 (L) 05/01/2020   CALCIUM 8.6 07/03/2019   CALCIUM 9.2 06/26/2019   LFT Recent Labs    05/01/20 0304  PROT 6.6  ALBUMIN 3.3*  AST 273*  ALT 112*  ALKPHOS 50  BILITOT 2.0*  BILIDIR 1.0*  IBILI 1.0*   PT/INR Lab Results  Component Value Date   INR 1.58 (H) 01/21/2015   INR 2.63 (H) 08/31/2010   INR 2.60 (H) 08/30/2010   Hepatitis Panel No results for input(s): HEPBSAG, HCVAB, HEPAIGM, HEPBIGM in the last 72 hours. C-Diff No components found for: CDIFF Lipase     Component Value Date/Time   LIPASE 43 05/01/2020 0304    Drugs of Abuse  No results found for: LABOPIA, COCAINSCRNUR, LABBENZ, AMPHETMU, THCU, LABBARB   RADIOLOGY STUDIES: CT ABDOMEN PELVIS WO CONTRAST  Result Date: 05/01/2020 CLINICAL DATA:  Acute nonlocalized abdominal pain. Epigastric pain and shortness of breath that began tonight EXAM: CT ABDOMEN AND PELVIS WITHOUT CONTRAST TECHNIQUE: Multidetector CT imaging of the abdomen and pelvis was performed following the standard protocol without IV contrast. COMPARISON:  10/26/2017 FINDINGS: Lower chest: Coronary atherosclerosis. Prior CABG. Fatty hiatal hernia. Calcified right hilar lymph nodes. Hepatobiliary: No focal liver abnormality.Cholelithiasis. No evidence of acute cholecystitis. Pancreas: Unremarkable. Spleen: Unremarkable. Adrenals/Urinary Tract: Negative adrenals. Symmetric renal atrophy. Renal sinus cysts on both sides. No hydronephrosis. Moderate distension of the urinary bladder with generalized wall thickening. Stomach/Bowel: No obstruction. No visible bowel inflammation. Mild distal colonic diverticulosis. Vascular/Lymphatic: No acute vascular abnormality. Atheromatous calcification of the aorta and branch vessels. No mass or adenopathy. Reproductive:Symmetric enlargement of the prostate which uplifts the bladder base. Other: No ascites or pneumoperitoneum. Shallow fatty right inguinal hernia suggested on coronal reformats. There  could be some fat herniation along the upper right femoral ring. Musculoskeletal: No acute abnormalities. L5 chronic pars defects with L5-S1 advanced disc degeneration, anterolisthesis, and severe biforaminal impingement. Spinal degeneration is generalized in the lower thoracic and lumbar spine. IMPRESSION: 1. No acute finding or specific cause of symptoms. 2. Cholelithiasis. 3. Enlarged prostate with findings of chronic outlet obstruction affecting the bladder. 4.  Aortic Atherosclerosis (ICD10-I70.0). Electronically Signed   By: Monte Fantasia M.D.   On: 05/01/2020 05:30   DG Chest 2 View  Result Date: 05/01/2020 CLINICAL DATA:  Epigastric pain, short of breath EXAM: CHEST - 2 VIEW COMPARISON:  02/18/2015, 11/04/2017 FINDINGS: Frontal and lateral views of the chest demonstrates stable postsurgical changes from median sternotomy. The cardiac silhouette is unremarkable. Lung volumes are diminished, with bibasilar consolidation, left greater than right, likely representing atelectasis. No effusion or pneumothorax. No acute bony abnormalities. IMPRESSION: 1. Low lung volumes, with bibasilar consolidation likely reflecting atelectasis. Electronically Signed   By: Randa Ngo M.D.   On: 05/01/2020 02:16   NM Hepato W/EF  Result Date: 05/01/2020 CLINICAL DATA:  Acute abdominal pain. Elevated LFTs and bilirubin level. EXAM: NUCLEAR MEDICINE HEPATOBILIARY IMAGING TECHNIQUE: Sequential images of the abdomen were obtained out to 60 minutes following intravenous administration of radiopharmaceutical. RADIOPHARMACEUTICALS:  7.6 mCi Tc-77m Choletec IV COMPARISON:  None. FINDINGS: Prompt uptake of activity by the liver is seen. After 1 hour of imaging there is persistent diffuse increased tracer uptake throughout the liver with no biliary activity identified. No gallbladder activity is visualized. IMPRESSION: Diffuse and persistent radiotracer activity identified  throughout the liver with no signs of biliary  activity after 1 hour of imaging. Differential consideration of these findings include hepatocellular dysfunction secondary to hepatitis versus high-grade bile duct obstruction. No gallbladder activity visualized. Electronically Signed   By: Kerby Moors M.D.   On: 05/01/2020 13:17   US Abdomen Limited RUQ (LIVER/GB)  Result Date: 05/01/2020 CLINICAL DATA:  Right upper quadrant pain. EXAM: ULTRASOUND ABDOMEN LIMITED RIGHT UPPER QUADRANT COMPARISON:  Ultrasound renal 03/18/2020, CT abdomen pelvis 10/26/2017, ultrasound abdomen 09/28/2017 FINDINGS: Gallbladder: Gallbladder sludge. No gallstones. No gallbladder wall thickening or pericholecystic fluid. No sonographic Murphy sign noted by sonographer. Common bile duct: Diameter: 5 mm. Liver: Limited evaluation with no definite focal lesion identified. Slightly increased parenchymal echogenicity. Portal vein is patent on color Doppler imaging with normal direction of blood flow towards the liver. Other: None. IMPRESSION: 1. Gallbladder sludge with no findings of acute cholecystitis or choledocholithiasis. 2. Hepatic steatosis. Please note limited evaluation for focal hepatic masses in a patient with hepatic steatosis due to decreased penetration of the acoustic ultrasound waves. Electronically Signed   By: Iven Finn M.D.   On: 05/01/2020 04:24     IMPRESSION:   *   Abdominal pain, nausea and vomiting with elevated transaminases, lesser elevation of T bili, normal alk phos. Imaging thus far includes ultrasound, CT without contrast, HIDA scan.  Although no obvious choledocholithiasis there is concern for this.  May have recurrent cholecystitis given the findings on the HIDA scan. In the setting of obstructive jaundice/choledocholithiasis.  Would expect alk phos to be elevated  *    history DVT.  Chronic Eliquis.  Last dose in evening of 3/16.  *     elevated high-sensitivity troponins.  Sinus tachycardia with bouts of SVT.,  ST changes.  Cardiology  is following the patient.  Efforts in place to control heart rate with IV Lopressor.   PLAN:     *   MRCP??   *   Perc biliary drain??   *   Empiric Zosyn in place.  *     Dr. Henrene Pastor will see the patient and formulate plan.   Azucena Freed  05/01/2020, 2:34 PM Phone [phone]  GI ATTENDING  History, laboratories, x-rays reviewed.  Patient seen and examined.  Agree with comprehensive consultation note as outlined above.  Patient admitted with abdominal pain.  Abnormal liver tests and a nonobstructive pattern.  Has had cholecystitis previously treated percutaneously.  Still with cholelithiasis.  Suspect that he has recurrent biliary colic without overt acute cholecystitis.  At risk for common duct stone, though low suspicion with normal biliary ductal diameter, normal alkaline phosphatase, and negative imaging study.  Agree with empiric Zosyn and trending liver tests.  Could consider MRCP.  No plans for ERCP based on overall picture and current data.  The patient would be HIGH RISK.  Docia Chuck. Geri Seminole., M.D. [facility] Division of Gastroenterology

## 2020-05-01 NOTE — H&P (Addendum)
History and Physical    Nathan [name] ZCH:[id number] DOB: [birth year] DOA: 05/01/2020  PCP: Leanna Battles, MD Consultants:  Nahser - cardiology; Prudence Davidson - podiatry; Carlis Abbott - vascular surgery Patient coming from:  Home - lives alone; NOK: daughter, Brenin Heidelberger, [phone]  Chief Complaint: Abdominal pain  HPI: Nathan [name] is a 87 y.o. male with medical history significant of CAD; HTN; HLD; DM; cholecystitis; and chronic combined CHF presenting with abdominal pain.  He started last night with epigastric pain and SOB.  He developed n/v while in the ER.  He specifically denies CP.  He feels like this is similar to prior gallbladder attacks and was previously supposed to have a cholecystectomy but never did.  He is completely miserable and reports that he would rather die than to continue to feel this way.    He was previously admitted for acute cholecystitis in 2016.  He developed sepsis and had elevated troponin and creatinine and so percutaneous drainage was pursued.  It is not clear why he never had subsequent cholecystectomy.    ED Course:  Carryover, per Dr. Marlowe Sax:  87 year old patient with history of gallstones presenting with complaint of abdominal pain and had episodes of emesis in the ED. Transaminases are elevated in the 200-300 range and T bili 2.0. CT showing gallstones. Right upper quadrant ultrasound showing biliary sludge but no cholecystitis or choledocholithiasis. He had a normal HIDA scan in 2019. ED physician discussed the case with general surgery who recommended repeating HIDA scan and if positive reconsulting general surgery. Atypical ACS was on the differential but less likely as although his troponin is mildly elevated but stable and EKG showing chronic changes.   Review of Systems: As per HPI; otherwise review of systems reviewed and negative.   Ambulatory Status:  Ambulates without assistance    Past Medical History:  Diagnosis Date  . Acid reflux   .  Aortic insufficiency   . Cholecystitis   . Combined systolic and diastolic heart failure ([place])    Echo 04/2018: inf-lat HK, EF 40-45, severe basal septal hypertrophy, mild conc LVH, Gr 1 DD, severe LAE, mild to mod TR, mod AI, asc Aorta 39 mm  . Coronary artery disease 2002   CABG x5  . Diabetes mellitus   . DVT (deep venous thrombosis) (HCC)    previously on coumadin  . Hypercholesteremia   . Hyperlipidemia   . Mitral valve regurgitation   . Skin cancer    tumor removed off of his back    Past Surgical History:  Procedure Laterality Date  . BILIARY DRAINAGE CATHETER PLACEMENT W/ BILE DUCT TUBE CHANGE  01/2015  . CORONARY ARTERY BYPASS GRAFT  12/21/2000   x5  . LEFT HEART CATH AND CORS/GRAFTS ANGIOGRAPHY N/A 04/06/2018   Procedure: LEFT HEART CATH AND CORS/GRAFTS ANGIOGRAPHY;  Surgeon: Lorretta Harp, MD;  Location: [facility];  Service: Cardiovascular;  Laterality: N/A;  . SKIN CANCER EXCISION    . TUMOR REMOVAL     off of back, skin cancer    Social History   Socioeconomic History  . Marital status: Divorced    Spouse name: Not on file  . Number of children: Not on file  . Years of education: Not on file  . Highest education level: Not on file  Occupational History  . Not on file  Tobacco Use  . Smoking status: Never Smoker  . Smokeless tobacco: Never Used  Vaping Use  . Vaping Use: Never used  Substance and Sexual  Activity  . Alcohol use: No  . Drug use: No  . Sexual activity: Not Currently  Other Topics Concern  . Not on file  Social History Narrative  . Not on file   Social Determinants of Health   Financial Resource Strain: Not on file  Food Insecurity: Not on file  Transportation Needs: Not on file  Physical Activity: Not on file  Stress: Not on file  Social Connections: Not on file  Intimate Partner Violence: Not on file    No Known Allergies  Family History  Problem Relation Age of Onset  . Heart attack Father   . Hypertension  Mother   . Diabetes Brother   . Cancer Sister        breast  . Cancer Brother        throat & mouth    Prior to Admission medications   Medication Sig Start Date End Date Taking? Authorizing Provider  albuterol (VENTOLIN HFA) 108 (90 Base) MCG/ACT inhaler Inhale 1-2 puffs into the lungs every 6 (six) hours as needed for shortness of breath or wheezing. 11/13/19  Yes [provider]  apixaban (ELIQUIS) 2.5 MG TABS tablet Take 2.5 mg by mouth 2 (two) times daily.   Yes [provider]  atorvastatin (LIPITOR) 40 MG tablet Take 40 mg by mouth daily. 02/02/15  Yes [provider]  Blood Glucose Monitoring Suppl (ONETOUCH VERIO) w/Device KIT  09/06/17  Yes [provider]  carvedilol (COREG) 6.25 MG tablet TAKE 1 TABLET BY MOUTH EVERY DAY Patient taking differently: Take 6.25 mg by mouth daily. 04/01/20  Yes Nahser, Wonda Cheng, MD  fenofibrate (TRICOR) 145 MG tablet Take 1 tablet (145 mg total) by mouth daily. 03/21/20  Yes Nahser, Wonda Cheng, MD  guaifenesin (ROBITUSSIN) 100 MG/5ML syrup Take 200 mg by mouth 3 (three) times daily as needed for cough or congestion.   Yes [provider]  hydrALAZINE (APRESOLINE) 25 MG tablet Take 1 tablet (25 mg total) by mouth 2 (two) times daily. 03/21/20  Yes Nahser, Wonda Cheng, MD  insulin NPH-regular Human (NOVOLIN 70/30) (70-30) 100 UNIT/ML injection Inject 20-35 Units into the skin See admin instructions. 35 units in the morning, 20 units at dinner   Yes [provider]  isosorbide mononitrate (IMDUR) 30 MG 24 hr tablet Take 1 tablet (30 mg total) by mouth daily. 07/10/19  Yes Nahser, Wonda Cheng, MD  Lancets Glory Rosebush ULTRASOFT) lancets  09/06/17  Yes [provider]  Multiple Vitamins-Minerals (CENTRUM SILVER PO) Take 1 tablet by mouth daily.   Yes [provider]  nitroGLYCERIN (NITROSTAT) 0.4 MG SL tablet Place 1 tablet (0.4 mg total) under the tongue every 5 (five) minutes as needed for chest pain.  04/04/18  Yes Weaver, Scott T, PA-C  Omega-3 Fatty Acids (FISH OIL) 1200 MG CAPS Take 1,200 mg by mouth 2 (two) times daily.   Yes [provider]  omeprazole (PRILOSEC) 20 MG capsule Take 20 mg by mouth 2 (two) times daily.  01/06/11  Yes [provider]  ONETOUCH VERIO test strip  09/06/17  Yes [provider]  [place] 1ML/31G 31G X 5/16" 1 ML MISC USE 1 TWICE DAILY AS DIRECTED 09/29/17  Yes [provider]  tamsulosin (FLOMAX) 0.4 MG CAPS capsule Take 0.4 mg by mouth at bedtime. 03/11/20  Yes [provider]  TOLAK 4 % CREA Apply to areas qhs x 3weeks 01/25/20  Yes [provider]    Physical Exam: Vitals:  05/01/20 1745 05/01/20 1800 05/01/20 1808 05/01/20 1841  BP: (!) 142/96 (!) 147/82  (!) 165/93  Pulse: (!) 35 86  82  Resp: (!) 26 (!) 33  (!) 22  Temp:   98.1 F (36.7 C) 97.9 F (36.6 C)  TempSrc:    Oral  SpO2: 94% 97%    Weight:      Height:         . General:  Appears acutely ill - vomiting, retching, and clearly miserable . Eyes:  PERRL, EOMI, normal lids, iris . ENT:  grossly normal hearing, lips & tongue, mmm; artificial dentition . Neck:  no LAD, masses or thyromegaly . Cardiovascular:  RRR, no m/r/g. No LE edema.  [name] Respiratory:   CTA bilaterally with no wheezes/rales/rhonchi.  Mildly increased respiratory effort on [place] O2 . Abdomen:  Diffusely very TTP . Skin:  no rash or induration seen on limited exam . Musculoskeletal:  grossly normal tone BUE/BLE, good ROM, no bony abnormality . Psychiatric:  flat mood and affect, speech fluent and appropriate, AOx3 . Neurologic:  CN 2-12 grossly intact, moves all extremities in coordinated fashion    Radiological Exams on Admission: Independently reviewed - see discussion in A/P where applicable  CT ABDOMEN PELVIS WO CONTRAST  Result Date: 05/01/2020 CLINICAL DATA:  Acute nonlocalized abdominal pain. Epigastric pain and shortness of breath that began  tonight EXAM: CT ABDOMEN AND PELVIS WITHOUT CONTRAST TECHNIQUE: Multidetector CT imaging of the abdomen and pelvis was performed following the standard protocol without IV contrast. COMPARISON:  10/26/2017 FINDINGS: Lower chest: Coronary atherosclerosis. Prior CABG. Fatty hiatal hernia. Calcified right hilar lymph nodes. Hepatobiliary: No focal liver abnormality.Cholelithiasis. No evidence of acute cholecystitis. Pancreas: Unremarkable. Spleen: Unremarkable. Adrenals/Urinary Tract: Negative adrenals. Symmetric renal atrophy. Renal sinus cysts on both sides. No hydronephrosis. Moderate distension of the urinary bladder with generalized wall thickening. Stomach/Bowel: No obstruction. No visible bowel inflammation. Mild distal colonic diverticulosis. Vascular/Lymphatic: No acute vascular abnormality. Atheromatous calcification of the aorta and branch vessels. No mass or adenopathy. Reproductive:Symmetric enlargement of the prostate which uplifts the bladder base. Other: No ascites or pneumoperitoneum. Shallow fatty right inguinal hernia suggested on coronal reformats. There could be some fat herniation along the upper right femoral ring. Musculoskeletal: No acute abnormalities. L5 chronic pars defects with L5-S1 advanced disc degeneration, anterolisthesis, and severe biforaminal impingement. Spinal degeneration is generalized in the lower thoracic and lumbar spine. IMPRESSION: 1. No acute finding or specific cause of symptoms. 2. Cholelithiasis. 3. Enlarged prostate with findings of chronic outlet obstruction affecting the bladder. 4.  Aortic Atherosclerosis (ICD10-I70.0). Electronically Signed   By: Monte Fantasia M.D.   On: 05/01/2020 05:30   DG Chest 2 View  Result Date: 05/01/2020 CLINICAL DATA:  Epigastric pain, short of breath EXAM: CHEST - 2 VIEW COMPARISON:  02/18/2015, 11/04/2017 FINDINGS: Frontal and lateral views of the chest demonstrates stable postsurgical changes from median sternotomy. The cardiac  silhouette is unremarkable. Lung volumes are diminished, with bibasilar consolidation, left greater than right, likely representing atelectasis. No effusion or pneumothorax. No acute bony abnormalities. IMPRESSION: 1. Low lung volumes, with bibasilar consolidation likely reflecting atelectasis. Electronically Signed   By: Randa Ngo M.D.   On: 05/01/2020 02:16   NM Hepato W/EF  Result Date: 05/01/2020 CLINICAL DATA:  Acute abdominal pain. Elevated LFTs and bilirubin level. EXAM: NUCLEAR MEDICINE HEPATOBILIARY IMAGING TECHNIQUE: Sequential images of the abdomen were obtained out to 60 minutes following intravenous administration of radiopharmaceutical. RADIOPHARMACEUTICALS:  7.6 mCi Tc-14m Choletec IV  COMPARISON:  None. FINDINGS: Prompt uptake of activity by the liver is seen. After 1 hour of imaging there is persistent diffuse increased tracer uptake throughout the liver with no biliary activity identified. No gallbladder activity is visualized. IMPRESSION: Diffuse and persistent radiotracer activity identified throughout the liver with no signs of biliary activity after 1 hour of imaging. Differential consideration of these findings include hepatocellular dysfunction secondary to hepatitis versus high-grade bile duct obstruction. No gallbladder activity visualized. Electronically Signed   By: Kerby Moors M.D.   On: 05/01/2020 13:17   US Abdomen Limited RUQ (LIVER/GB)  Result Date: 05/01/2020 CLINICAL DATA:  Right upper quadrant pain. EXAM: ULTRASOUND ABDOMEN LIMITED RIGHT UPPER QUADRANT COMPARISON:  Ultrasound renal 03/18/2020, CT abdomen pelvis 10/26/2017, ultrasound abdomen 09/28/2017 FINDINGS: Gallbladder: Gallbladder sludge. No gallstones. No gallbladder wall thickening or pericholecystic fluid. No sonographic Murphy sign noted by sonographer. Common bile duct: Diameter: 5 mm. Liver: Limited evaluation with no definite focal lesion identified. Slightly increased parenchymal echogenicity. Portal  vein is patent on color Doppler imaging with normal direction of blood flow towards the liver. Other: None. IMPRESSION: 1. Gallbladder sludge with no findings of acute cholecystitis or choledocholithiasis. 2. Hepatic steatosis. Please note limited evaluation for focal hepatic masses in a patient with hepatic steatosis due to decreased penetration of the acoustic ultrasound waves. Electronically Signed   By: Iven Finn M.D.   On: 05/01/2020 04:24    EKG: Independently reviewed.   0156 - NSR with rate 67; first degree AV block, LAFB/bifacsicular block; nonspecific ST changes with no evidence of acute ischemia 0459 - NSR with rate 86; RBBB and LAFB 0649 - Junctional tachycardia with rate 134; RBBB; concern for inferior and lateral lead dynamic changes 0745 - Wide complex tachycardia to 143; RBBB   Labs on Admission: I have personally reviewed the available labs and imaging studies at the time of the admission.  Pertinent labs:   Glucose 137 BUN 35/Creatinine 2.24/GFR 28 - improved from prior Albumin 3.3 AST 273/ALT 112/Bili 2.0 HS troponin 28, 24 WBC 10.8 Hgb 12.9 COVID/flu negative   Assessment/Plan Principal Problem:   Abdominal pain Active Problems:   Hypercholesteremia   Diabetes mellitus type 2 with complications (HCC)   Essential hypertension   Chronic combined systolic and diastolic CHF (congestive heart failure) (HCC)   Stage 4 chronic kidney disease (HCC)   Calculus of gallbladder with biliary obstruction but without cholecystitis   Elevated LFTs   Abdominal pain with elevated LFTs -Patient with longstanding h/o cholelithiasis presenting with worsening diffuse abdominal pain with vomiting -CT and RUQ appear to indicate only cholelithiasis -Surgery will consult -STAT HIDA scan -He is not a reasonable surgical candidate at this time and so will need perc drain if this is the issue -May need MRCP/ERCP given elevated LFTs -NPO -Morphine for pain after his HIDA  scan -Zofran for nausea -Empiric coverage with Zosyn for now UPDATE: HIDA scan shows no contrast in gallbladder indicating either hepatitis or complete ductal obstruction - GI consult requested  Demand ischemia with h/o CAD -s/p CABG x 5 vessels -While the patient has been in the ER, he has had successive EKGs showing changes increasingly consistent with ischemia -Cardiology has been consulted and recommends IV Lopressor -He does not have CP and initial 2 troponins have been negative -Repeat troponin was markedly elevated but cardiology thinks related to demand ischemia and does not recommend heparin at this time -He may need initiation of heparin but will defer to cardiology -Hold Imdur for now  UPDATE: He remained seriously ill throughout the day and PCCM was consulted due to nursing concerns that the patient was too sick for progressive care  Stage 4 CKD -Appears to be stable at this time, will follow  Chronic combined CHF -04/2018 echo with EF 10-93% and diastolic dysfunction -He may benefit from repeat echo but will hold for now pending HIDA scan and further plan there -Needs judicious IVF hydration  DVT -In 2019 -On Eliquis (holding)  HTN -Only takes Coreg once daily which is ineffective; hold and cover with IV Labetalol -Hold Hydralazine and cover with IV  HLD -Hold Lipitor, fish oil, and Tricor given LFT elevation and uncertainty in diagnosis  DM -Hold 70/30 given NPO and cover with moderate-scale SSI for now    Note: This patient has been tested and is negative for the novel coronavirus COVID-19. The patient has been fully vaccinated against COVID-19.    Total critical care time: 65 minutes Critical care time was exclusive of separately billable procedures and treating other patients. Critical care was necessary to treat or prevent imminent or life-threatening deterioration. Critical care was time spent personally by me on the following activities: development of  treatment plan with patient and/or surrogate as well as nursing, discussions with consultants, evaluation of patient's response to treatment, examination of patient, obtaining history from patient or surrogate, ordering and performing treatments and interventions, ordering and review of laboratory studies, ordering and review of radiographic studies, pulse oximetry and re-evaluation of patient's condition.      Level of care: Progressive DVT prophylaxis:  SCDs Code Status: DNR - confirmed with patient/family Family Communication: None present; I spoke with the patient's daughter by telephone at the time of admission. Disposition Plan:  The patient is from: home  Anticipated d/c is to: home without [facility] services   Anticipated d/c date will depend on clinical response to treatment, likely several days from now  Patient is currently: acutely ill Consults called: Surgery; GI; PCCM; Cardiology Admission status:  Admit - It is my clinical opinion that admission to INPATIENT is reasonable and necessary because of the expectation that this patient will require hospital care that crosses at least 2 midnights to treat this condition based on the medical complexity of the problems presented.  Given the aforementioned information, the predictability of an adverse outcome is felt to be significant.    Karmen Bongo MD [facility]   How to contact the [facility] Attending or Consulting provider [place] or covering provider during after hours [place], for this patient?  1. Check the care team in [facility] and look for a) attending/consulting [facility] provider listed and b) the [facility] team listed 2. Log into [email or web address] and use [place]'s universal password to access. If you do not have the password, please contact the hospital operator. 3. Locate the [facility] provider you are looking for under [facility] and page to a number that you can be directly reached. 4. If you still have difficulty reaching the provider,  please page the [facility] (Director on Call) for the Hospitalists listed on amion for assistance.   05/01/2020, 7:14 PM

## 2020-05-02 ENCOUNTER — Inpatient Hospital Stay (HOSPITAL_COMMUNITY): Payer: PPO

## 2020-05-02 ENCOUNTER — Encounter: Payer: PPO | Admitting: Cardiovascular Disease

## 2020-05-02 DIAGNOSIS — I5042 Chronic combined systolic (congestive) and diastolic (congestive) heart failure: Secondary | ICD-10-CM | POA: Diagnosis not present

## 2020-05-02 DIAGNOSIS — R7989 Other specified abnormal findings of blood chemistry: Secondary | ICD-10-CM | POA: Diagnosis not present

## 2020-05-02 DIAGNOSIS — I214 Non-ST elevation (NSTEMI) myocardial infarction: Secondary | ICD-10-CM

## 2020-05-02 DIAGNOSIS — R1010 Upper abdominal pain, unspecified: Secondary | ICD-10-CM | POA: Diagnosis not present

## 2020-05-02 DIAGNOSIS — N184 Chronic kidney disease, stage 4 (severe): Secondary | ICD-10-CM | POA: Diagnosis not present

## 2020-05-02 HISTORY — PX: IR PERC CHOLECYSTOSTOMY: IMG2326

## 2020-05-02 HISTORY — PX: IR US GUIDANCE: IMG2393

## 2020-05-02 LAB — COMPREHENSIVE METABOLIC PANEL
ALT: 191 U/L — ABNORMAL HIGH (ref 0–44)
AST: 364 U/L — ABNORMAL HIGH (ref 15–41)
Albumin: 2.7 g/dL — ABNORMAL LOW (ref 3.5–5.0)
Alkaline Phosphatase: 129 U/L — ABNORMAL HIGH (ref 38–126)
Anion gap: 15 (ref 5–15)
BUN: 45 mg/dL — ABNORMAL HIGH (ref 8–23)
CO2: 18 mmol/L — ABNORMAL LOW (ref 22–32)
Calcium: 8.3 mg/dL — ABNORMAL LOW (ref 8.9–10.3)
Chloride: 108 mmol/L (ref 98–111)
Creatinine, Ser: 3.13 mg/dL — ABNORMAL HIGH (ref 0.61–1.24)
GFR, Estimated: 19 mL/min — ABNORMAL LOW (ref >60)
Glucose, Bld: 154 mg/dL — ABNORMAL HIGH (ref 70–99)
Potassium: 3.7 mmol/L (ref 3.5–5.1)
Sodium: 141 mmol/L (ref 135–145)
Total Bilirubin: 6 mg/dL — ABNORMAL HIGH (ref 0.3–1.2)
Total Protein: 6.1 g/dL — ABNORMAL LOW (ref 6.5–8.1)

## 2020-05-02 LAB — LACTIC ACID, PLASMA
Lactic Acid, Venous: 4.8 mmol/L (ref 0.5–1.9)
Lactic Acid, Venous: 5.1 mmol/L (ref 0.5–1.9)

## 2020-05-02 LAB — GLUCOSE, CAPILLARY
Glucose-Capillary: 112 mg/dL — ABNORMAL HIGH (ref 70–99)
Glucose-Capillary: 98 mg/dL (ref 70–99)
Glucose-Capillary: 153 mg/dL — ABNORMAL HIGH (ref 70–99)
Glucose-Capillary: 184 mg/dL — ABNORMAL HIGH (ref 70–99)
Glucose-Capillary: 188 mg/dL — ABNORMAL HIGH (ref 70–99)

## 2020-05-02 LAB — CBC
HCT: 37.3 % — ABNORMAL LOW (ref 39.0–52.0)
Hemoglobin: 12.5 g/dL — ABNORMAL LOW (ref 13.0–17.0)
MCH: 30.5 pg (ref 26.0–34.0)
MCHC: 33.5 g/dL (ref 30.0–36.0)
MCV: 91 fL (ref 80.0–100.0)
Platelets: 171 10*3/uL (ref 150–400)
RBC: 4.1 MIL/uL — ABNORMAL LOW (ref 4.22–5.81)
RDW: 14.1 % (ref 11.5–15.5)
WBC: 6.3 10*3/uL (ref 4.0–10.5)
nRBC: 0 % (ref 0.0–0.2)

## 2020-05-02 LAB — APTT: aPTT: 67 seconds — ABNORMAL HIGH (ref 24–36)

## 2020-05-02 LAB — ECHOCARDIOGRAM COMPLETE
Area-P 1/2: 5.13 cm2
Height: 72 in
P 1/2 time: 231 msec
S' Lateral: 4.1 cm
Single Plane A4C EF: 40.2 %
Weight: 3168 oz

## 2020-05-02 LAB — HEPARIN LEVEL (UNFRACTIONATED): Heparin Unfractionated: 0.39 IU/mL (ref 0.30–0.70)

## 2020-05-02 MED ORDER — LIDOCAINE HCL (PF) 1 % IJ SOLN
INTRAMUSCULAR | Status: AC | PRN
  Administered 2020-05-02: 30 mL

## 2020-05-02 MED ORDER — IOHEXOL 300 MG/ML  SOLN
50.0000 mL | Freq: Once | INTRAMUSCULAR | Status: AC | PRN
  Administered 2020-05-02: 20 mL

## 2020-05-02 MED ORDER — SODIUM CHLORIDE 0.9 % IV BOLUS
250.0000 mL | Freq: Once | INTRAVENOUS | Status: AC
  Administered 2020-05-02: 250 mL via INTRAVENOUS

## 2020-05-02 MED ORDER — ASPIRIN EC 81 MG PO TBEC
81.0000 mg | DELAYED_RELEASE_TABLET | Freq: Every day | ORAL | Status: DC
  Administered 2020-05-02 – 2020-05-12 (×11): 81 mg via ORAL
  Filled 2020-05-02 (×11): qty 1

## 2020-05-02 MED ORDER — SODIUM CHLORIDE 0.9 % IV BOLUS
500.0000 mL | Freq: Once | INTRAVENOUS | Status: AC
  Administered 2020-05-02: 500 mL via INTRAVENOUS

## 2020-05-02 MED ORDER — INSULIN ASPART 100 UNIT/ML ~~LOC~~ SOLN
0.0000 [IU] | SUBCUTANEOUS | Status: DC
  Administered 2020-05-02 (×2): 1 [IU] via SUBCUTANEOUS
  Administered 2020-05-03 (×4): 2 [IU] via SUBCUTANEOUS
  Administered 2020-05-04 (×2): 1 [IU] via SUBCUTANEOUS
  Administered 2020-05-04 (×2): 2 [IU] via SUBCUTANEOUS
  Administered 2020-05-04 – 2020-05-06 (×5): 1 [IU] via SUBCUTANEOUS
  Administered 2020-05-06: 2 [IU] via SUBCUTANEOUS
  Administered 2020-05-06: 1 [IU] via SUBCUTANEOUS

## 2020-05-02 MED ORDER — SODIUM CHLORIDE 0.9% FLUSH
5.0000 mL | Freq: Three times a day (TID) | INTRAVENOUS | Status: DC
  Administered 2020-05-02 – 2020-05-12 (×28): 5 mL

## 2020-05-02 MED ORDER — PIPERACILLIN-TAZOBACTAM IN DEX 2-0.25 GM/50ML IV SOLN
2.2500 g | Freq: Three times a day (TID) | INTRAVENOUS | Status: DC
  Administered 2020-05-02 – 2020-05-06 (×13): 2.25 g via INTRAVENOUS
  Filled 2020-05-02 (×15): qty 50

## 2020-05-02 MED ORDER — MIDAZOLAM HCL 2 MG/2ML IJ SOLN
INTRAMUSCULAR | Status: AC | PRN
  Administered 2020-05-02: 1 mg via INTRAVENOUS

## 2020-05-02 MED ORDER — METOPROLOL TARTRATE 5 MG/5ML IV SOLN: 5.0000 mg | Freq: Four times a day (QID) | INTRAVENOUS | Status: DC | PRN

## 2020-05-02 MED ORDER — FENTANYL CITRATE (PF) 100 MCG/2ML IJ SOLN
INTRAMUSCULAR | Status: AC
  Filled 2020-05-02: qty 2

## 2020-05-02 MED ORDER — FENTANYL CITRATE (PF) 100 MCG/2ML IJ SOLN
INTRAMUSCULAR | Status: AC | PRN
  Administered 2020-05-02: 25 ug via INTRAVENOUS

## 2020-05-02 MED ORDER — HEPARIN (PORCINE) 25000 UT/250ML-% IV SOLN
1800.0000 [IU]/h | INTRAVENOUS | Status: DC
  Administered 2020-05-02: 1200 [IU]/h via INTRAVENOUS
  Administered 2020-05-04: 1650 [IU]/h via INTRAVENOUS
  Administered 2020-05-04: 1500 [IU]/h via INTRAVENOUS
  Filled 2020-05-02 (×6): qty 250

## 2020-05-02 MED ORDER — HEPARIN (PORCINE) 25000 UT/250ML-% IV SOLN
1200.0000 [IU]/h | INTRAVENOUS | Status: DC
  Filled 2020-05-02: qty 250

## 2020-05-02 MED ORDER — MIDAZOLAM HCL 2 MG/2ML IJ SOLN
INTRAMUSCULAR | Status: AC
  Filled 2020-05-02: qty 2

## 2020-05-02 MED ORDER — LIDOCAINE HCL 1 % IJ SOLN
INTRAMUSCULAR | Status: AC
  Filled 2020-05-02: qty 20

## 2020-05-02 MED ORDER — MEPERIDINE HCL 25 MG/ML IJ SOLN
INTRAMUSCULAR | Status: AC
  Filled 2020-05-02: qty 1

## 2020-05-02 NOTE — Progress Notes (Signed)
  Echocardiogram 2D Echocardiogram has been performed.  Nathan [name] 05/02/2020, 2:09 PM

## 2020-05-02 NOTE — Progress Notes (Addendum)
NT:IRWER abdominal pain, n/v with cholelithiasis, elevated wbc and lft's   Subjective: He is lying in bed, and not sure where he feels bad at.  He does not complain of chest pain, abdominal pain or SOB despite being on HFNC.  His abdomen is soft, no real areas of specific tenderness, just generalized soreness on initial exam, later he just can't be sure of any particular site of discomfort.    Objective: Vital signs in last 24 hours: Temp:  [97.8 F (36.6 C)-98.7 F (37.1 C)] 97.9 F (36.6 C) (03/18 0610) Pulse Rate:  [34-153] 87 (03/18 0610) Resp:  [18-35] 25 (03/18 0621) BP: (82-167)/(52-122) 82/52 (03/18 0610) SpO2:  [90 %-97 %] 92 % (03/18 0621)  afebrile, BP down 82/52 HR 88-90's HFNC sats 91-92% Troponin 28>>24>>305>>2988>>5012 Creatinine 2.24>>3.13 CMP Latest Ref Rng & Units 05/02/2020 05/01/2020 07/03/2019  Total Bilirubin 0.3 - 1.2 mg/dL 6.0(H) 2.0(H) -  Alkaline Phos 38 - 126 U/L 129(H) 50 -  AST 15 - 41 U/L 364(H) 273(H) -  ALT 0 - 44 U/L 191(H) 112(H) -  WBC10.8>>6.3  Intake/Output from previous day: 03/17 0701 - 03/18 0700 In: 1246.3 [I.V.:1093.7; IV Piggyback:152.6] Out: -  Intake/Output this shift: Total I/O In: 1246.3 [I.V.:1093.7; IV Piggyback:152.6] Out: -   General appearance: alert, cooperative, fatigued and no distress Resp: clear to auscultation bilaterally and on HFNC Cardio: regular rate and rhythm; denies any chest pain GI: soft, generalized soreness, but no specific site of pain.  No flatus or BM since admit. Extremities: trace edema  Lab Results:  Recent Labs    05/01/20 0156 05/02/20 0112  WBC 10.8* 6.3  HGB 12.9* 12.5*  HCT 38.1* 37.3*  PLT 250 171    BMET Recent Labs    05/01/20 0156 05/02/20 0112  NA 138 141  K 3.6 3.7  CL 108 108  CO2 21* 18*  GLUCOSE 137* 154*  BUN 35* 45*  CREATININE 2.24* 3.13*  CALCIUM 8.8* 8.3*   PT/INR No results for input(s): LABPROT, INR in the last 72 hours.  Recent Labs  Lab  05/01/20 0304 05/02/20 0112  AST 273* 364*  ALT 112* 191*  ALKPHOS 50 129*  BILITOT 2.0* 6.0*  PROT 6.6 6.1*  ALBUMIN 3.3* 2.7*     Lipase     Component Value Date/Time   LIPASE 43 05/01/2020 0304     Medications: . insulin aspart  0-15 Units Subcutaneous TID WC  . insulin aspart  0-5 Units Subcutaneous QHS    Assessment/Plan CAD s/p CABG x 5 in 2002 w/ last cath in 2020 w/ occluded SVG diagonal and SVG-marginal CHF (EF 40-45% in 2020) Hx of HTN Hx of HLD Hx of diabetes CKD acute on chronic  - creatinine Creatinine 2.24>>3.13 Hx of DVT on Eliquis (last dose last night) -  currently on hold  2 Syncopal episodes at home - reports he lives alone. Defer workup to TRH. May need CM to see Elevated Tn's with EKG changes and tachycardia - Cardiology following. Repeat pending.    - Troponin 28>>24>>305>>2988>>5012   Epigastric abdominal pain, n/v Cholelithiasis/cholecystitis - Hx of perc chole drain that was placed on 01/21/2015 by IR and removed on 03/25/2015.  - WBC 10.8>>6.3   Lipase 43.   AST 273>>364,  ALT 112>>191,  T bili 2.0>>6.0   Right upper quadrant ultrasound with gallbladder sludge but no gallbladder wall thickening, pericholecystic fluid or Murphy sign.  CBD 5 mm.  He did have hepatic steatosis.  CT A/P  showed cholelithiasis with no acute findings or specific cause of his symptoms.   - HIDA: Diffuse and persistent radiotracer activity identified throughout the liver with no signs of biliary activity after 1 hour of imaging. Differential consideration of these findings include hepatocellular dysfunction secondary to hepatitis versus high-grade bile duct obstruction. No gallbladder activity visualized. - Agree with IV Zosyn in the meantime to cover. If positive would recommend IR perc chole drain given MMP (cards reports patient is high risk).  - Reviewed by Dr. Marlou Starks.  Recommend IR drain placement after he has been off  Eliquis an adequate amount of time.  With Elevated  troponins will defer to Medicine/cardiology   - GI following also - Please keep NPO    Plan:  Await GI evaluation today.  Once his Eliquis is off for an adequate interval we recommend IR drain.     LOS: 1 day    Alzina Golda 05/02/2020 Please see Amion

## 2020-05-02 NOTE — Procedures (Signed)
Interventional Radiology Procedure Note  Procedure: Image guided drain placement, Perc Chole.  37F pigtail drain, to gravity.  Complications: None  EBL: None Sample: Culture sent  Recommendations: - Routine drain care, with sterile flushes, record output - follow up Cx - routine wound care  Signed,  Dulcy Fanny. Earleen Newport, DO

## 2020-05-02 NOTE — Progress Notes (Addendum)
Complete note to follow  Discussed with surgery and epic chatted care team Patient hemodynamically seems to be declining and physiological parameters point at impending sepsis/MOD S  I have discussed personally with Dr. Earleen Newport of IR who will assess patient for [facility] chole[facility]  Patient is a very high periprocedural risk for cardiac event but this has been contemplated by the care team and is an absolute necessity  He is DNR and I have discussed this with the care team  I will update his daughter [name] this morning to let her know---[phone].  [name] indicates that Mr. Spickler wife passed away in 03-26-2022 after protracted hospitalization with multiorgan system failure and needed a colectomy She still feels relatively "raw" from this and I offered chaplaincy to come and talk to her which she politely declined-we will support holistically if needed and may involve palliative care in discussions depending on clinical course later on today   > 30 minutes prolonged time in addition to clinical note which will follow today  Verneita Griffes, MD [facility] 8:37 AM

## 2020-05-02 NOTE — Consult Note (Signed)
Chief Complaint: Patient was seen in consultation today for  Chief Complaint  Patient presents with  . Abdominal Pain    Referring Physician(s):   Supervising Physician: Corrie Mckusick  Patient Status: [facility] - In-pt  History of Present Illness: Nathan [name] is an 87 y.o. male with a medical history significant for CAD, HTN, DM, cholecystitis and CHF. He presented to the [facility] ED 05/01/20 with complaints of epigastric pain and shortness of breath. He has a history of having a cholecystostomy in 2016 and was supposed to get his gallbladder removed but never did.  Lab work was significant for elevated WBC, LFTs and bilirubin; imaging in the ED showed only cholelithiasis. A HIDA was also done and this showed: "Diffuse and persistent radiotracer activity identified throughout the liver with no signs of biliary activity after 1 hour of imaging. Differential consideration of these findings include hepatocellular dysfunction secondary to hepatitis versus high-grade bile duct obstruction. No gallbladder activity visualized."  The patient has continued to decline with signs of impending sepsis/MODS. Interventional Radiology has been asked to evaluate this patient for an urgent placement of a percutaneous cholecystostomy. This case was reviewed and procedure approved by Dr. Earleen Newport.   Past Medical History:  Diagnosis Date  . Acid reflux   . Aortic insufficiency   . Cholecystitis   . Combined systolic and diastolic heart failure ([place])    Echo 04/2018: inf-lat HK, EF 40-45, severe basal septal hypertrophy, mild conc LVH, Gr 1 DD, severe LAE, mild to mod TR, mod AI, asc Aorta 39 mm  . Coronary artery disease 2002   CABG x5  . Diabetes mellitus   . DVT (deep venous thrombosis) (H[facility])    previously on coumadin  . Hypercholesteremia   . Hyperlipidemia   . Mitral valve regurgitation   . Skin cancer    tumor removed off of his back    Past Surgical History:  Procedure Laterality Date  . BILIARY  DRAINAGE CATHETER PLACEMENT W/ BILE DUCT TUBE CHANGE  01/2015  . CORONARY ARTERY BYPASS GRAFT  12/21/2000   x5  . LEFT HEART CATH AND CORS/GRAFTS ANGIOGRAPHY N/A 04/06/2018   Procedure: LEFT HEART CATH AND CORS/GRAFTS ANGIOGRAPHY;  Surgeon: Lorretta Harp, MD;  Location: [place] [facility];  Service: Cardiovascular;  Laterality: N/A;  . SKIN CANCER EXCISION    . TUMOR REMOVAL     off of back, skin cancer    Allergies: Patient has no known allergies.  Medications: Prior to Admission medications   Medication Sig Start Date End Date Taking? Authorizing Provider  albuterol (VENTOLIN HFA) 108 (90 Base) MCG/ACT inhaler Inhale 1-2 puffs into the lungs every 6 (six) hours as needed for shortness of breath or wheezing. 11/13/19  Yes [provider]  apixaban (ELIQUIS) 2.5 MG TABS tablet Take 2.5 mg by mouth 2 (two) times daily.   Yes [provider]  atorvastatin (LIPITOR) 40 MG tablet Take 40 mg by mouth daily. 02/02/15  Yes [provider]  Blood Glucose Monitoring Suppl (ONETOUCH VERIO) w/Device KIT  09/06/17  Yes [provider]  carvedilol (COREG) 6.25 MG tablet TAKE 1 TABLET BY MOUTH EVERY DAY Patient taking differently: Take 6.25 mg by mouth daily. 04/01/20  Yes Nahser, Wonda Cheng, MD  fenofibrate (TRICOR) 145 MG tablet Take 1 tablet (145 mg total) by mouth daily. 03/21/20  Yes Nahser, Wonda Cheng, MD  guaifenesin (ROBITUSSIN) 100 MG/5ML syrup Take 200 mg by mouth 3 (three) times daily as needed for cough or congestion.  Yes [provider]  hydrALAZINE (APRESOLINE) 25 MG tablet Take 1 tablet (25 mg total) by mouth 2 (two) times daily. 03/21/20  Yes Nahser, Wonda Cheng, MD  insulin NPH-regular Human (NOVOLIN 70/30) (70-30) 100 UNIT/ML injection Inject 20-35 Units into the skin See admin instructions. 35 units in the morning, 20 units at dinner   Yes [provider]  isosorbide mononitrate (IMDUR) 30 MG 24 hr tablet Take 1 tablet (30 mg total) by mouth  daily. 07/10/19  Yes Nahser, Wonda Cheng, MD  Lancets Glory Rosebush ULTRASOFT) lancets  09/06/17  Yes [provider]  Multiple Vitamins-Minerals (CENTRUM SILVER PO) Take 1 tablet by mouth daily.   Yes [provider]  nitroGLYCERIN (NITROSTAT) 0.4 MG SL tablet Place 1 tablet (0.4 mg total) under the tongue every 5 (five) minutes as needed for chest pain. 04/04/18  Yes Weaver, Scott T, PA-C  Omega-3 Fatty Acids (FISH OIL) 1200 MG CAPS Take 1,200 mg by mouth 2 (two) times daily.   Yes [provider]  omeprazole (PRILOSEC) 20 MG capsule Take 20 mg by mouth 2 (two) times daily.  01/06/11  Yes [provider]  ONETOUCH VERIO test strip  09/06/17  Yes [provider]  Stout 1ML/31G 31G X 5/16" 1 ML MISC USE 1 TWICE DAILY AS DIRECTED 09/29/17  Yes [provider]  tamsulosin (FLOMAX) 0.4 MG CAPS capsule Take 0.4 mg by mouth at bedtime. 03/11/20  Yes [provider]  TOLAK 4 % CREA Apply to areas qhs x 3weeks 01/25/20  Yes [provider]     Family History  Problem Relation Age of Onset  . Heart attack Father   . Hypertension Mother   . Diabetes Brother   . Cancer Sister        breast  . Cancer Brother        throat & mouth    Social History   Socioeconomic History  . Marital status: Divorced    Spouse name: Not on file  . Number of children: Not on file  . Years of education: Not on file  . Highest education level: Not on file  Occupational History  . Not on file  Tobacco Use  . Smoking status: Never Smoker  . Smokeless tobacco: Never Used  Vaping Use  . Vaping Use: Never used  Substance and Sexual Activity  . Alcohol use: No  . Drug use: No  . Sexual activity: Not Currently  Other Topics Concern  . Not on file  Social History Narrative  . Not on file   Social Determinants of Health   Financial Resource Strain: Not on file  Food Insecurity: Not on file  Transportation Needs: Not on file   Physical Activity: Not on file  Stress: Not on file  Social Connections: Not on file    Review of Systems: A 12 point ROS discussed and pertinent positives are indicated in the HPI above.  All other systems are negative.  Review of Systems  Unable to perform ROS: Mental status change    Vital Signs: BP (!) 89/55 (BP Location: Right Arm)   Pulse 84   Temp 98 F (36.7 C) (Oral)   Resp (!) 25   Ht 6' (1.829 m)   Wt 198 lb (89.8 kg)   SpO2 95%   BMI 26.85 kg/m   Physical Exam Constitutional:      General: He is in acute distress.     Appearance: He is ill-appearing.  HENT:  Mouth/Throat:     Mouth: Mucous membranes are dry.     Pharynx: Oropharynx is clear.     Comments: dentures Cardiovascular:     Rate and Rhythm: Normal rate. Rhythm irregular.     Pulses: Normal pulses.     Heart sounds: Normal heart sounds.  Pulmonary:     Effort: Tachypnea present.     Breath sounds: Normal breath sounds.  Abdominal:     Palpations: Abdomen is soft.     Tenderness: There is abdominal tenderness.  Skin:    General: Skin is warm and dry.  Neurological:     Comments: Unable to fully assess. Patient able to state his name, he knows he's at [name], he was able to answer questions about pain.      Imaging: CT ABDOMEN PELVIS WO CONTRAST  Result Date: 05/01/2020 CLINICAL DATA:  Acute nonlocalized abdominal pain. Epigastric pain and shortness of breath that began tonight EXAM: CT ABDOMEN AND PELVIS WITHOUT CONTRAST TECHNIQUE: Multidetector CT imaging of the abdomen and pelvis was performed following the standard protocol without IV contrast. COMPARISON:  10/26/2017 FINDINGS: Lower chest: Coronary atherosclerosis. Prior CABG. Fatty hiatal hernia. Calcified right hilar lymph nodes. Hepatobiliary: No focal liver abnormality.Cholelithiasis. No evidence of acute cholecystitis. Pancreas: Unremarkable. Spleen: Unremarkable. Adrenals/Urinary Tract: Negative adrenals. Symmetric renal  atrophy. Renal sinus cysts on both sides. No hydronephrosis. Moderate distension of the urinary bladder with generalized wall thickening. Stomach/Bowel: No obstruction. No visible bowel inflammation. Mild distal colonic diverticulosis. Vascular/Lymphatic: No acute vascular abnormality. Atheromatous calcification of the aorta and branch vessels. No mass or adenopathy. Reproductive:Symmetric enlargement of the prostate which uplifts the bladder base. Other: No ascites or pneumoperitoneum. Shallow fatty right inguinal hernia suggested on coronal reformats. There could be some fat herniation along the upper right femoral ring. Musculoskeletal: No acute abnormalities. L5 chronic pars defects with L5-S1 advanced disc degeneration, anterolisthesis, and severe biforaminal impingement. Spinal degeneration is generalized in the lower thoracic and lumbar spine. IMPRESSION: 1. No acute finding or specific cause of symptoms. 2. Cholelithiasis. 3. Enlarged prostate with findings of chronic outlet obstruction affecting the bladder. 4.  Aortic Atherosclerosis (ICD10-I70.0). Electronically Signed   By: Monte Fantasia M.D.   On: 05/01/2020 05:30   DG Chest 2 View  Result Date: 05/01/2020 CLINICAL DATA:  Epigastric pain, short of breath EXAM: CHEST - 2 VIEW COMPARISON:  02/18/2015, 11/04/2017 FINDINGS: Frontal and lateral views of the chest demonstrates stable postsurgical changes from median sternotomy. The cardiac silhouette is unremarkable. Lung volumes are diminished, with bibasilar consolidation, left greater than right, likely representing atelectasis. No effusion or pneumothorax. No acute bony abnormalities. IMPRESSION: 1. Low lung volumes, with bibasilar consolidation likely reflecting atelectasis. Electronically Signed   By: Randa Ngo M.D.   On: 05/01/2020 02:16   NM Hepato W/EF  Result Date: 05/01/2020 CLINICAL DATA:  Acute abdominal pain. Elevated LFTs and bilirubin level. EXAM: NUCLEAR MEDICINE HEPATOBILIARY  IMAGING TECHNIQUE: Sequential images of the abdomen were obtained out to 60 minutes following intravenous administration of radiopharmaceutical. RADIOPHARMACEUTICALS:  7.6 mCi Tc-58m Choletec IV COMPARISON:  None. FINDINGS: Prompt uptake of activity by the liver is seen. After 1 hour of imaging there is persistent diffuse increased tracer uptake throughout the liver with no biliary activity identified. No gallbladder activity is visualized. IMPRESSION: Diffuse and persistent radiotracer activity identified throughout the liver with no signs of biliary activity after 1 hour of imaging. Differential consideration of these findings include hepatocellular dysfunction secondary to hepatitis versus high-grade bile duct  obstruction. No gallbladder activity visualized. Electronically Signed   By: Kerby Moors M.D.   On: 05/01/2020 13:17   US Abdomen Limited RUQ (LIVER/GB)  Result Date: 05/01/2020 CLINICAL DATA:  Right upper quadrant pain. EXAM: ULTRASOUND ABDOMEN LIMITED RIGHT UPPER QUADRANT COMPARISON:  Ultrasound renal 03/18/2020, CT abdomen pelvis 10/26/2017, ultrasound abdomen 09/28/2017 FINDINGS: Gallbladder: Gallbladder sludge. No gallstones. No gallbladder wall thickening or pericholecystic fluid. No sonographic Murphy sign noted by sonographer. Common bile duct: Diameter: 5 mm. Liver: Limited evaluation with no definite focal lesion identified. Slightly increased parenchymal echogenicity. Portal vein is patent on color Doppler imaging with normal direction of blood flow towards the liver. Other: None. IMPRESSION: 1. Gallbladder sludge with no findings of acute cholecystitis or choledocholithiasis. 2. Hepatic steatosis. Please note limited evaluation for focal hepatic masses in a patient with hepatic steatosis due to decreased penetration of the acoustic ultrasound waves. Electronically Signed   By: Iven Finn M.D.   On: 05/01/2020 04:24    Labs:  CBC: Recent Labs    05/01/20 0156 05/02/20 0112   WBC 10.8* 6.3  HGB 12.9* 12.5*  HCT 38.1* 37.3*  PLT 250 171    COAGS: No results for input(s): INR, APTT in the last 8760 hours.  BMP: Recent Labs    06/26/19 0811 07/03/19 0821 05/01/20 0156 05/02/20 0112  NA 142 142 138 141  K 4.0 4.3 3.6 3.7  CL 105 112* 108 108  CO2 20 18* 21* 18*  GLUCOSE 100* 75 137* 154*  BUN 51* 37* 35* 45*  CALCIUM 9.2 8.6 8.8* 8.3*  CREATININE 2.93* 2.38* 2.24* 3.13*  GFRNONAA 19* 24* 28* 19*  GFRAA 22* 28*  --   --     LIVER FUNCTION TESTS: Recent Labs    06/26/19 0811 05/01/20 0304 05/02/20 0112  BILITOT 0.6 2.0* 6.0*  AST 36 273* 364*  ALT 24 112* 191*  ALKPHOS 50 50 129*  PROT 7.0 6.6 6.1*  ALBUMIN 4.0 3.3* 2.7*    TUMOR MARKERS: No results for input(s): AFPTM, CEA, CA199, CHROMGRNA in the last 8760 hours.  Assessment and Plan:  Cholelithiasis; cholecystitis; sepsis: [name], 87 year old male, presents today to the [facility] department for a percutaneous cholecystostomy drain. Telephone consent was obtained from the patient's daughter, Nathan [name].   Risks and benefits discussed with the patient including bleeding, infection, damage to adjacent structures, bowel perforation/fistula connection, and sepsis.  All of the patient's questions were answered, patient is agreeable to proceed. The patient does take Eliquis - his last dose was prior to his arrival in the ED. He has been NPO. Labs and vitals have been reviewed.   Consent signed and in chart.  Thank you for this interesting consult.  I greatly enjoyed meeting Nathan [name] and look forward to participating in their care.  A copy of this report was sent to the requesting provider on this date.  Electronically Signed: Soyla Dryer, AGACNP-BC [phone] 05/02/2020, 9:34 AM   I spent a total of 20 Minutes    in face to face in clinical consultation, greater than 50% of which was counseling/coordinating care for image-guided percutaneous  cholecystostomy

## 2020-05-02 NOTE — Progress Notes (Signed)
PROGRESS NOTE   Rito Lecomte  PPI:[id number] DOB: [birth year] DOA: 05/01/2020 PCP: Leanna Battles, MD  Brief Narrative:  71 white male  CABG X5 2002 nuke stress 2017 and ICM EF 40-45% 04/06/2018 L LE DVT 09/19/2017 Prior cholecystitis (deferred lap chole)-Normal HIDA 2019 CKD 4 baseline HLD DM TY 2  Represent [facility] ED 3/16 abdominal pain S OB, nausea/vomit Transaminitis 200 range bili 2.0 CT = GB stone RUQ USG = sludge - cholecystitis choledocholithiasis  General surgery, GI, cardiology, critical care all consulted on admission Patient confirmed DNR at bedside  Hospital-Problem based course  Severe sepsis on admission with multiorgan dysfunction syndrome secondary to acute cholecystitis Sepsis physiology slightly improved as per drain placed on broad-spectrum antibiotics Continue LR 75 cc/H Morphine 2 mg every 2 as needed pain Continue full liquid diet today Follow-up cholecystotomy cultures-10 F pigtail drain to be managed by general surgery/IR Patient confirmed to be no CODE BLUE What is the long-term plan regarding PERC drain?-he had a PERC drain several years ago and now this has recurred is there an option for interval lap cholecystectomy? NSTEMI on admission troponins now 5000-likely demand ischemia Underlying CAD CABG X5 As per cardiology-on heparin/asa 81 Hydralazine changed to IV metoprolol every 2 as needed pressure over 180 Holding Imdur 30, Coreg 6.25 until can take p.o. Hold statins until taking p.o. DVT 2019 Eliquis on hold-on IV heparin secondary to NSTEMI CKD 4 Fluids as above DM TY 2-diet controlled? Strict glycemic control sliding scale ordered  DVT prophylaxis: IV heparin therapeutic dosing Code Status: DNR confirmed Family Communication: Met with daughter this morning then met with patient's grandson at the bedside middle of the afternoon patient has stabilized but not out of the woods-we are going to watch him very closely he is at high risk for  decompensation but he is DNR Disposition:  Status is: Inpatient  Remains inpatient appropriate because:Ongoing active pain requiring inpatient pain management, Altered mental status, Ongoing diagnostic testing needed not appropriate for outpatient work up and Inpatient level of care appropriate due to severity of illness   Dispo: The patient is from: Home              Anticipated d/c is to: SNF              Patient currently is not medically stable to d/c.   Difficult to place patient No       Consultants:   Multiple as above  Procedures: Cholecystotomy 3/18  Antimicrobials: Vancomycin Zosyn   Subjective: Reviewed after procedure and patient is more stable MAP seems to improved to 70s Probable home  Objective: Vitals:   05/02/20 1430 05/02/20 1500 05/02/20 1530 05/02/20 1600  BP: 116/66 111/61 109/74 123/62  Pulse: 77 76 82 80  Resp: (!) 29 (!) 23 (!) 26 (!) 24  Temp:      TempSrc:      SpO2: (!) 89% 92% 91% 92%  Weight:      Height:        Intake/Output Summary (Last 24 hours) at 05/02/2020 1833 Last data filed at 05/02/2020 1600 Gross per 24 hour  Intake 1324.14 ml  Output 75 ml  Net 1249.14 ml   Filed Weights   05/01/20 0228  Weight: 89.8 kg    Examination:  Awakening in nad no focal deficit EOMI NCAT no focal deficit PERC drain now in place No lower extremity edema ROM intact Neurologically intact CTA B no added sound   Data Reviewed: personally reviewed   CBC  Component Value Date/Time   WBC 6.3 05/02/2020 0112   RBC 4.10 (L) 05/02/2020 0112   HGB 12.5 (L) 05/02/2020 0112   HGB 13.6 04/04/2018 1203   HCT 37.3 (L) 05/02/2020 0112   HCT 41.1 04/04/2018 1203   PLT 171 05/02/2020 0112   PLT 189 04/04/2018 1203   MCV 91.0 05/02/2020 0112   MCV 91 04/04/2018 1203   MCH 30.5 05/02/2020 0112   MCHC 33.5 05/02/2020 0112   RDW 14.1 05/02/2020 0112   RDW 13.3 04/04/2018 1203   LYMPHSABS 4.1 (H) 02/19/2015 0305   MONOABS 1.7 (H)  02/19/2015 0305   EOSABS 0.2 02/19/2015 0305   BASOSABS 0.1 02/19/2015 0305   CMP Latest Ref Rng & Units 05/02/2020 05/01/2020 07/03/2019  Glucose 70 - 99 mg/dL 154(H) 137(H) 75  BUN 8 - 23 mg/dL 45(H) 35(H) 37(H)  Creatinine 0.61 - 1.24 mg/dL 3.13(H) 2.24(H) 2.38(H)  Sodium 135 - 145 mmol/L 141 138 142  Potassium 3.5 - 5.1 mmol/L 3.7 3.6 4.3  Chloride 98 - 111 mmol/L 108 108 112(H)  CO2 22 - 32 mmol/L 18(L) 21(L) 18(L)  Calcium 8.9 - 10.3 mg/dL 8.3(L) 8.8(L) 8.6  Total Protein 6.5 - 8.1 g/dL 6.1(L) 6.6 -  Total Bilirubin 0.3 - 1.2 mg/dL 6.0(H) 2.0(H) -  Alkaline Phos 38 - 126 U/L 129(H) 50 -  AST 15 - 41 U/L 364(H) 273(H) -  ALT 0 - 44 U/L 191(H) 112(H) -     Radiology Studies: CT ABDOMEN PELVIS WO CONTRAST  Result Date: 05/01/2020 CLINICAL DATA:  Acute nonlocalized abdominal pain. Epigastric pain and shortness of breath that began tonight EXAM: CT ABDOMEN AND PELVIS WITHOUT CONTRAST TECHNIQUE: Multidetector CT imaging of the abdomen and pelvis was performed following the standard protocol without IV contrast. COMPARISON:  10/26/2017 FINDINGS: Lower chest: Coronary atherosclerosis. Prior CABG. Fatty hiatal hernia. Calcified right hilar lymph nodes. Hepatobiliary: No focal liver abnormality.Cholelithiasis. No evidence of acute cholecystitis. Pancreas: Unremarkable. Spleen: Unremarkable. Adrenals/Urinary Tract: Negative adrenals. Symmetric renal atrophy. Renal sinus cysts on both sides. No hydronephrosis. Moderate distension of the urinary bladder with generalized wall thickening. Stomach/Bowel: No obstruction. No visible bowel inflammation. Mild distal colonic diverticulosis. Vascular/Lymphatic: No acute vascular abnormality. Atheromatous calcification of the aorta and branch vessels. No mass or adenopathy. Reproductive:Symmetric enlargement of the prostate which uplifts the bladder base. Other: No ascites or pneumoperitoneum. Shallow fatty right inguinal hernia suggested on coronal reformats.  There could be some fat herniation along the upper right femoral ring. Musculoskeletal: No acute abnormalities. L5 chronic pars defects with L5-S1 advanced disc degeneration, anterolisthesis, and severe biforaminal impingement. Spinal degeneration is generalized in the lower thoracic and lumbar spine. IMPRESSION: 1. No acute finding or specific cause of symptoms. 2. Cholelithiasis. 3. Enlarged prostate with findings of chronic outlet obstruction affecting the bladder. 4.  Aortic Atherosclerosis (ICD10-I70.0). Electronically Signed   By: Monte Fantasia M.D.   On: 05/01/2020 05:30   DG Chest 2 View  Result Date: 05/01/2020 CLINICAL DATA:  Epigastric pain, short of breath EXAM: CHEST - 2 VIEW COMPARISON:  02/18/2015, 11/04/2017 FINDINGS: Frontal and lateral views of the chest demonstrates stable postsurgical changes from median sternotomy. The cardiac silhouette is unremarkable. Lung volumes are diminished, with bibasilar consolidation, left greater than right, likely representing atelectasis. No effusion or pneumothorax. No acute bony abnormalities. IMPRESSION: 1. Low lung volumes, with bibasilar consolidation likely reflecting atelectasis. Electronically Signed   By: Randa Ngo M.D.   On: 05/01/2020 02:16   NM Hepato W/EF  Result Date:  05/01/2020 CLINICAL DATA:  Acute abdominal pain. Elevated LFTs and bilirubin level. EXAM: NUCLEAR MEDICINE HEPATOBILIARY IMAGING TECHNIQUE: Sequential images of the abdomen were obtained out to 60 minutes following intravenous administration of radiopharmaceutical. RADIOPHARMACEUTICALS:  7.6 mCi Tc-29m Choletec IV COMPARISON:  None. FINDINGS: Prompt uptake of activity by the liver is seen. After 1 hour of imaging there is persistent diffuse increased tracer uptake throughout the liver with no biliary activity identified. No gallbladder activity is visualized. IMPRESSION: Diffuse and persistent radiotracer activity identified throughout the liver with no signs of biliary  activity after 1 hour of imaging. Differential consideration of these findings include hepatocellular dysfunction secondary to hepatitis versus high-grade bile duct obstruction. No gallbladder activity visualized. Electronically Signed   By: TKerby MoorsM.D.   On: 05/01/2020 13:17   IR Perc Cholecystostomy  Result Date: 05/02/2020 INDICATION: 87year old male referred for percutaneous cholecystostomy EXAM: CHOLECYSTOSTOMY; IR ULTRASOUND GUIDANCE MEDICATIONS: None ANESTHESIA/SEDATION: Moderate (conscious) sedation was employed during this procedure. A total of Versed 1.0 mg and Fentanyl 25 mcg was administered intravenously. Moderate Sedation Time: 12 minutes. The patient's level of consciousness and vital signs were monitored continuously by radiology nursing throughout the procedure under my direct supervision. FLUOROSCOPY TIME:  Fluoroscopy Time: 0 minutes 6 seconds (7 mGy). COMPLICATIONS: None PROCEDURE: Informed written consent was obtained from the patient and the patient's family after a thorough discussion of the procedural risks, benefits and alternatives. All questions were addressed. Maximal Sterile Barrier Technique was utilized including caps, mask, sterile gowns, sterile gloves, sterile drape, hand hygiene and skin antiseptic. A timeout was performed prior to the initiation of the procedure. Ultrasound survey of the right upper quadrant was performed for planning purposes. Once the patient is prepped and draped in the usual sterile fashion, the skin and subcutaneous tissues overlying the gallbladder were generously infiltrated 1% lidocaine for local anesthesia. A coaxial needle was advanced under ultrasound guidance through the skin subcutaneous tissues and a small segment of liver into the gallbladder lumen. With removal of the stylet, spontaneous dark bile drainage occurred. Using modified Seldinger technique, a 10 French drain was placed into the gallbladder fossa, with aspiration of the  sample for the lab. Contrast injection confirmed position of the tube within the gallbladder lumen. Drainage catheter was attached to gravity drain with a suture retention placed. Patient tolerated the procedure well and remained hemodynamically stable throughout. No complications were encountered and no significant blood loss encountered. IMPRESSION: Status post percutaneous cholecystostomy. Signed, JDulcy Fanny WDellia Nims RPVI [facility] Electronically Signed   By: JCorrie MckusickD.O.   On: 05/02/2020 11:20   IR [place]  Result Date: 05/02/2020 INDICATION: 87year old male referred for percutaneous cholecystostomy EXAM: CHOLECYSTOSTOMY; IR ULTRASOUND GUIDANCE MEDICATIONS: None ANESTHESIA/SEDATION: Moderate (conscious) sedation was employed during this procedure. A total of Versed 1.0 mg and Fentanyl 25 mcg was administered intravenously. Moderate Sedation Time: 12 minutes. The patient's level of consciousness and vital signs were monitored continuously by radiology nursing throughout the procedure under my direct supervision. FLUOROSCOPY TIME:  Fluoroscopy Time: 0 minutes 6 seconds (7 mGy). COMPLICATIONS: None PROCEDURE: Informed written consent was obtained from the patient and the patient's family after a thorough discussion of the procedural risks, benefits and alternatives. All questions were addressed. Maximal Sterile Barrier Technique was utilized including caps, mask, sterile gowns, sterile gloves, sterile drape, hand hygiene and skin antiseptic. A timeout was performed prior to the initiation of the procedure. Ultrasound survey of the right upper quadrant  was performed for planning purposes. Once the patient is prepped and draped in the usual sterile fashion, the skin and subcutaneous tissues overlying the gallbladder were generously infiltrated 1% lidocaine for local anesthesia. A coaxial needle was advanced under ultrasound guidance through  the skin subcutaneous tissues and a small segment of liver into the gallbladder lumen. With removal of the stylet, spontaneous dark bile drainage occurred. Using modified Seldinger technique, a 10 French drain was placed into the gallbladder fossa, with aspiration of the sample for the lab. Contrast injection confirmed position of the tube within the gallbladder lumen. Drainage catheter was attached to gravity drain with a suture retention placed. Patient tolerated the procedure well and remained hemodynamically stable throughout. No complications were encountered and no significant blood loss encountered. IMPRESSION: Status post percutaneous cholecystostomy. Signed, Dulcy Fanny. Dellia Nims, RPVI [facility] Radiology Electronically Signed   By: Corrie Mckusick D.O.   On: 05/02/2020 11:20   ECHOCARDIOGRAM COMPLETE  Result Date: 05/02/2020    ECHOCARDIOGRAM REPORT   Patient Name:   BURWELL BETHEL Date of Exam: 05/02/2020 Medical Rec #:  [id number]    Height:       72.0 in Accession #:    [id number]   Weight:       198.0 lb Date of Birth:  [birth year]    BSA:          2.121 m Patient Age:    87 years     BP:           100/61 mmHg Patient Gender: M            HR:           79 bpm. Exam Location:  Inpatient Procedure: 2D Echo Indications:    NSTEMI  History:        Patient has prior history of Echocardiogram examinations, most                 recent 04/26/2018. CAD, chronic kidney disease; Risk                 Factors:Dyslipidemia.  Sonographer:    Johny Chess Referring Phys: [id number] [place] A CHANDRASEKHAR IMPRESSIONS  1. Left ventricular ejection fraction, by estimation, is 40 to 45%. The left ventricle has mildly decreased function. The left ventricle demonstrates regional wall motion abnormalities (see scoring diagram/findings for description). Left ventricular diastolic parameters are indeterminate.  2. Right ventricular systolic function is mildly reduced. The right  ventricular size is mildly enlarged. There is mildly elevated pulmonary artery systolic pressure. The IVC is not well seen, but using an estimated RA pressure of 8, the estimated right ventricular systolic pressure is 88.2 mmHg.  3. Left atrial size was moderately dilated.  4. Mitral regurgitation is anteriorly directed and eccentric, and may be underestimated. There is a small flail segment best seen in parasternal long axis views. . The mitral valve is degenerative. Mild to moderate mitral valve regurgitation. No evidence of mitral stenosis.  5. Tricuspid valve regurgitation is severe.  6. The aortic valve is abnormal. There is moderate calcification of the aortic valve. Aortic valve regurgitation is mild to moderate. No aortic stenosis is present. FINDINGS  Left Ventricle: Left ventricular ejection fraction, by estimation, is 40 to 45%. The left ventricle has mildly decreased function. The left ventricle demonstrates regional wall motion abnormalities. The left ventricular internal cavity size was normal in size. There is no left ventricular hypertrophy. Left ventricular diastolic parameters are indeterminate.  LV Wall Scoring: The mid and distal lateral wall and mid anterolateral segment are hypokinetic. Right Ventricle: The right ventricular size is mildly enlarged. Right vetricular wall thickness was not well visualized. Right ventricular systolic function is mildly reduced. There is mildly elevated pulmonary artery systolic pressure. The tricuspid regurgitant velocity is 2.85 m/s, and with an assumed right atrial pressure of 8 mmHg, the estimated right ventricular systolic pressure is 59.5 mmHg. Left Atrium: Left atrial size was moderately dilated. Right Atrium: Right atrial size was normal in size. Pericardium: Trivial pericardial effusion is present. Mitral Valve: Mitral regurgitation is anteriorly directed and eccentric, and may be underestimated. There is a small flail segment best seen in parasternal  long axis views. The mitral valve is degenerative in appearance. Mild to moderate mitral valve regurgitation. No evidence of mitral valve stenosis. Tricuspid Valve: The tricuspid valve is normal in structure. Tricuspid valve regurgitation is severe. Aortic Valve: Vena contracta 0.4 cm. The aortic valve is abnormal. There is moderate calcification of the aortic valve. Aortic valve regurgitation is mild to moderate. Aortic regurgitation PHT measures 231 msec. No aortic stenosis is present. Pulmonic Valve: The pulmonic valve was grossly normal. Pulmonic valve regurgitation is trivial. Aorta: The aortic root is normal in size and structure. Venous: The inferior vena cava was not well visualized. IAS/Shunts: The interatrial septum was not well visualized.  LEFT VENTRICLE PLAX 2D LVIDd:         5.20 cm     Diastology LVIDs:         4.10 cm     LV e' medial:    8.59 cm/s LV PW:         1.20 cm     LV E/e' medial:  8.6 LV IVS:        1.00 cm     LV e' lateral:   8.27 cm/s LVOT diam:     2.30 cm     LV E/e' lateral: 8.9 LV SV:         76 LV SV Index:   36 LVOT Area:     4.15 cm  LV Volumes (MOD) LV vol d, MOD A4C: 74.2 ml LV vol s, MOD A4C: 44.4 ml LV SV MOD A4C:     74.2 ml RIGHT VENTRICLE RV S prime:     10.80 cm/s TAPSE (M-mode): 1.3 cm LEFT ATRIUM             Index       RIGHT ATRIUM           Index LA diam:        4.60 cm 2.17 cm/m  RA Area:     16.50 cm LA Vol (A2C):   98.7 ml 46.52 ml/m RA Volume:   44.50 ml  20.98 ml/m LA Vol (A4C):   73.4 ml 34.60 ml/m LA Biplane Vol: 88.8 ml 41.86 ml/m  AORTIC VALVE LVOT Vmax:   76.70 cm/s LVOT Vmean:  49.300 cm/s LVOT VTI:    0.183 m AI PHT:      231 msec  AORTA Ao Root diam: 3.50 cm MITRAL VALVE               TRICUSPID VALVE MV Area (PHT): 5.13 cm    TR Peak grad:   32.5 mmHg MV Decel Time: 148 msec    TR Vmax:        285.00 cm/s MV E velocity: 73.80 cm/s MV A velocity: 58.00 cm/s  SHUNTS MV E/A ratio:  1.27  Systemic VTI:  0.18 m                            Systemic  Diam: 2.30 cm Cherlynn Kaiser MD Electronically signed by Cherlynn Kaiser MD Signature Date/Time: 05/02/2020/4:19:00 PM    Final    US Abdomen Limited RUQ (LIVER/GB)  Result Date: 05/01/2020 CLINICAL DATA:  Right upper quadrant pain. EXAM: ULTRASOUND ABDOMEN LIMITED RIGHT UPPER QUADRANT COMPARISON:  Ultrasound renal 03/18/2020, CT abdomen pelvis 10/26/2017, ultrasound abdomen 09/28/2017 FINDINGS: Gallbladder: Gallbladder sludge. No gallstones. No gallbladder wall thickening or pericholecystic fluid. No sonographic Murphy sign noted by sonographer. Common bile duct: Diameter: 5 mm. Liver: Limited evaluation with no definite focal lesion identified. Slightly increased parenchymal echogenicity. Portal vein is patent on color Doppler imaging with normal direction of blood flow towards the liver. Other: None. IMPRESSION: 1. Gallbladder sludge with no findings of acute cholecystitis or choledocholithiasis. 2. Hepatic steatosis. Please note limited evaluation for focal hepatic masses in a patient with hepatic steatosis due to decreased penetration of the acoustic ultrasound waves. Electronically Signed   By: Iven Finn M.D.   On: 05/01/2020 04:24     Scheduled Meds: . insulin aspart  0-15 Units Subcutaneous TID WC  . insulin aspart  0-5 Units Subcutaneous QHS  . lidocaine      . sodium chloride flush  5 mL Intracatheter Q8H   Continuous Infusions: . sodium chloride Stopped (05/02/20 0508)  . heparin 1,200 Units/hr (05/02/20 1353)  . lactated ringers Stopped (05/02/20 4199)  . piperacillin-tazobactam (ZOSYN)  IV 2.25 g (05/02/20 1349)     LOS: 1 day   Time spent: [place], MD [facility] To contact the attending provider between 7A-7P or the covering provider during after hours 7P-7A, please log into the web site [email or web address] and access using universal [place] password for that web site. If you do not have the password, please call the hospital  operator.  05/02/2020, 6:33 PM

## 2020-05-02 NOTE — Progress Notes (Signed)
Pharmacy Antibiotic Note  Nathan [name] is a 87 y.o. male admitted on 05/01/2020 with IAI.  Pharmacy has been consulted for zosyn dosing. Cr up this morning.  Plan: Adjust Zosyn to 2.25g IV q8h Follow Cr  Height: 6' (182.9 cm) Weight: 89.8 kg (198 lb) IBW/kg (Calculated) : 77.6  Temp (24hrs), Avg:98.1 F (36.7 C), Min:97.8 F (36.6 C), Max:98.7 F (37.1 C)  Recent Labs  Lab 05/01/20 0156 05/02/20 0112  WBC 10.8* 6.3  CREATININE 2.24* 3.13*    Estimated Creatinine Clearance: 19.3 mL/min (A) (by C-G formula based on SCr of 3.13 mg/dL (H)).    No Known Allergies  Arrie Senate, PharmD, BCPS, [facility] Clinical Pharmacist [phone] Please check AMION for all [place] numbers 05/02/2020

## 2020-05-02 NOTE — Progress Notes (Signed)
Progress Note  Patient Name: Nathan [name] Date of Encounter: 05/02/2020  Primary Cardiologist: [name], MD   Subjective   Persistent general abdominal pain; though has told different provider different types of pain.  S/p tachycardia and stress in ED; significant troponin elevation (as high as 5). Through this has denied CP.   Inpatient Medications    Scheduled Meds: . insulin aspart  0-15 Units Subcutaneous TID WC  . insulin aspart  0-5 Units Subcutaneous QHS   Continuous Infusions: . sodium chloride Stopped (05/02/20 0508)  . lactated ringers Stopped (05/02/20 4580)  . piperacillin-tazobactam (ZOSYN)  IV     PRN Meds: sodium chloride, acetaminophen **OR** acetaminophen, albuterol, hydrALAZINE, metoprolol tartrate, morphine injection, ondansetron **OR** ondansetron (ZOFRAN) IV   Vital Signs    Vitals:   05/02/20 0310 05/02/20 0610 05/02/20 0621 05/02/20 0745  BP: 120/67 (!) 82/52  (!) 89/55  Pulse: 91 87  84  Resp: (!) 23 (!) 25 (!) 25   Temp: 98.7 F (37.1 C) 97.9 F (36.6 C)  98 F (36.7 C)  TempSrc: Oral Oral  Oral  SpO2: 92% 90% 92% 95%  Weight:      Height:        Intake/Output Summary (Last 24 hours) at 05/02/2020 0849 Last data filed at 05/02/2020 0645 Gross per 24 hour  Intake 1246.27 ml  Output --  Net 1246.27 ml   Filed Weights   05/01/20 0228  Weight: 89.8 kg    Telemetry    SR without SVT from prior - Personally Reviewed  ECG    No new since last eval (AM 05/01/20 scanned in for 1337) - Personally Reviewed  Physical Exam   GEN: Elderly male in distress   Neck: No JVD Cardiac: RRR, no murmurs, rubs, or gallops.  Respiratory: Clear to auscultation bilaterally; poor air movement; examined supine with pain when trying to sit up GI: Soft but tender MS: +2 edema; No deformity. Neuro:  Nonfocal  Psych: Normal affect   Labs    Chemistry Recent Labs  Lab 05/01/20 0156 05/01/20 0304 05/02/20 0112  NA 138  --  141  K 3.6  --   3.7  CL 108  --  108  CO2 21*  --  18*  GLUCOSE 137*  --  154*  BUN 35*  --  45*  CREATININE 2.24*  --  3.13*  CALCIUM 8.8*  --  8.3*  PROT  --  6.6 6.1*  ALBUMIN  --  3.3* 2.7*  AST  --  273* 364*  ALT  --  112* 191*  ALKPHOS  --  50 129*  BILITOT  --  2.0* 6.0*  GFRNONAA 28*  --  19*  ANIONGAP 9  --  15     Hematology Recent Labs  Lab 05/01/20 0156 05/02/20 0112  WBC 10.8* 6.3  RBC 4.21* 4.10*  HGB 12.9* 12.5*  HCT 38.1* 37.3*  MCV 90.5 91.0  MCH 30.6 30.5  MCHC 33.9 33.5  RDW 13.6 14.1  PLT 250 171    Cardiac EnzymesNo results for input(s): TROPONINI in the last 168 hours. No results for input(s): TROPIPOC in the last 168 hours.   BNPNo results for input(s): BNP, PROBNP in the last 168 hours.   DDimer No results for input(s): DDIMER in the last 168 hours.   Radiology    CT ABDOMEN PELVIS WO CONTRAST  Result Date: 05/01/2020 CLINICAL DATA:  Acute nonlocalized abdominal pain. Epigastric pain and shortness of breath that began  tonight EXAM: CT ABDOMEN AND PELVIS WITHOUT CONTRAST TECHNIQUE: Multidetector CT imaging of the abdomen and pelvis was performed following the standard protocol without IV contrast. COMPARISON:  10/26/2017 FINDINGS: Lower chest: Coronary atherosclerosis. Prior CABG. Fatty hiatal hernia. Calcified right hilar lymph nodes. Hepatobiliary: No focal liver abnormality.Cholelithiasis. No evidence of acute cholecystitis. Pancreas: Unremarkable. Spleen: Unremarkable. Adrenals/Urinary Tract: Negative adrenals. Symmetric renal atrophy. Renal sinus cysts on both sides. No hydronephrosis. Moderate distension of the urinary bladder with generalized wall thickening. Stomach/Bowel: No obstruction. No visible bowel inflammation. Mild distal colonic diverticulosis. Vascular/Lymphatic: No acute vascular abnormality. Atheromatous calcification of the aorta and branch vessels. No mass or adenopathy. Reproductive:Symmetric enlargement of the prostate which uplifts the  bladder base. Other: No ascites or pneumoperitoneum. Shallow fatty right inguinal hernia suggested on coronal reformats. There could be some fat herniation along the upper right femoral ring. Musculoskeletal: No acute abnormalities. L5 chronic pars defects with L5-S1 advanced disc degeneration, anterolisthesis, and severe biforaminal impingement. Spinal degeneration is generalized in the lower thoracic and lumbar spine. IMPRESSION: 1. No acute finding or specific cause of symptoms. 2. Cholelithiasis. 3. Enlarged prostate with findings of chronic outlet obstruction affecting the bladder. 4.  Aortic Atherosclerosis (ICD10-I70.0). Electronically Signed   By: Monte Fantasia M.D.   On: 05/01/2020 05:30   DG Chest 2 View  Result Date: 05/01/2020 CLINICAL DATA:  Epigastric pain, short of breath EXAM: CHEST - 2 VIEW COMPARISON:  02/18/2015, 11/04/2017 FINDINGS: Frontal and lateral views of the chest demonstrates stable postsurgical changes from median sternotomy. The cardiac silhouette is unremarkable. Lung volumes are diminished, with bibasilar consolidation, left greater than right, likely representing atelectasis. No effusion or pneumothorax. No acute bony abnormalities. IMPRESSION: 1. Low lung volumes, with bibasilar consolidation likely reflecting atelectasis. Electronically Signed   By: Randa Ngo M.D.   On: 05/01/2020 02:16   NM Hepato W/EF  Result Date: 05/01/2020 CLINICAL DATA:  Acute abdominal pain. Elevated LFTs and bilirubin level. EXAM: NUCLEAR MEDICINE HEPATOBILIARY IMAGING TECHNIQUE: Sequential images of the abdomen were obtained out to 60 minutes following intravenous administration of radiopharmaceutical. RADIOPHARMACEUTICALS:  7.6 mCi Tc-1m  Choletec IV COMPARISON:  None. FINDINGS: Prompt uptake of activity by the liver is seen. After 1 hour of imaging there is persistent diffuse increased tracer uptake throughout the liver with no biliary activity identified. No gallbladder activity is  visualized. IMPRESSION: Diffuse and persistent radiotracer activity identified throughout the liver with no signs of biliary activity after 1 hour of imaging. Differential consideration of these findings include hepatocellular dysfunction secondary to hepatitis versus high-grade bile duct obstruction. No gallbladder activity visualized. Electronically Signed   By: Kerby Moors M.D.   On: 05/01/2020 13:17   US Abdomen Limited RUQ (LIVER/GB)  Result Date: 05/01/2020 CLINICAL DATA:  Right upper quadrant pain. EXAM: ULTRASOUND ABDOMEN LIMITED RIGHT UPPER QUADRANT COMPARISON:  Ultrasound renal 03/18/2020, CT abdomen pelvis 10/26/2017, ultrasound abdomen 09/28/2017 FINDINGS: Gallbladder: Gallbladder sludge. No gallstones. No gallbladder wall thickening or pericholecystic fluid. No sonographic Murphy sign noted by sonographer. Common bile duct: Diameter: 5 mm. Liver: Limited evaluation with no definite focal lesion identified. Slightly increased parenchymal echogenicity. Portal vein is patent on color Doppler imaging with normal direction of blood flow towards the liver. Other: None. IMPRESSION: 1. Gallbladder sludge with no findings of acute cholecystitis or choledocholithiasis. 2. Hepatic steatosis. Please note limited evaluation for focal hepatic masses in a patient with hepatic steatosis due to decreased penetration of the acoustic ultrasound waves. Electronically Signed   By: Clelia Croft.D.  On: 05/01/2020 04:24    Cardiac Studies    Left/Right Heart Catheterizations: Date: 05/02/2020 Results:  Ost LM to Dist LM lesion is 99% stenosed.  Prox RCA lesion is 99% stenosed.  Mid RCA lesion is 100% stenosed.  Origin to Prox Graft lesion is 100% stenosed.  Origin to Prox Graft lesion is 100% stenosed.  Ost Cx to Prox Cx lesion is 99% stenosed.  Prox Cx lesion is 100% stenosed.  Ost LAD to Prox LAD lesion is 100% stenosed.   Transthoracic Echocardiogram: Date: 05/02/2020 Results: 1.  Moderate hypokinesis of the left ventricular, entire inferolateral  wall.  2. The left ventricle has mild-moderately reduced systolic function, with  an ejection fraction of 40-45%. The cavity size was normal. severe  hypertrophy of the basal septum with otherwise mild concentric  hypertrophy.. Left ventricular diastolic Doppler  parameters are consistent with impaired relaxation. Indeterminate filling  pressures.  3. The right ventricle has normal systolic function. The cavity was  normal. There is no increase in right ventricular wall thickness.  4. Left atrial size was severely dilated.  5. The mitral valve is grossly normal. Mild thickening of the mitral  valve leaflet. Mild calcification of the mitral valve leaflet.  6. Tricuspid valve regurgitation is mild-moderate.  7. The aortic valve is abnormal Moderate thickening of the aortic valve  Moderate calcification of the aortic valve. Aortic valve regurgitation is  moderate by color flow Doppler.  8. There appears to be near fusion of the left and non-coronary cusps.  Eccentric aortic regurgitation. Pressure half time 346 ms.  9. There is mild dilatation of the ascending aorta measuring 39 mm    Patient Profile     87 y.o. male of CAD s/p CABG, HTN, HLD, DM, cholecystitis (deferred lap chole in the past with prior drain), CKD stage IV, and chronic systolic and diastolic heart failure with concern of demand ischemia and NSTEMI in the setting of cholecystitis  Assessment & Plan    NSTEMI- likely demand in the setting of tachycardia (see EKGs AM 3/17 with ST depression) CKD Stage IV Coronary Artery Disease; Obstructive HFrEF with moderate AI - asymptomatic - anatomy:   SVG to PDA and PLA sequentially patent in 2020 and gives collaterals - post Stat JP Drain; would load ASA and start heparin (Last DOAC 04/30/20 PM) - continue statin when tolerating PO goal LDL < 70 - continue BB if BP tolerates (currently taking no PO  medications) - Will get echocardiogram for WMA -limited interventions available from coronary procedural stand point and could potential cause patient to need HD  Cholecystitis - high risk for surgical interventions given comorbidity, planned for stat drain  SVT - resolved with electrolyte repletion  Discussed with patient and primary MD  For questions or updates, please contact [facility] Please consult [email or web address] for contact info under Cardiology/STEMI.      Signed, Werner Lean, MD  05/02/2020, 8:49 AM

## 2020-05-02 NOTE — Progress Notes (Signed)
[place] for heparin Indication: hx DVT  No Known Allergies  Patient Measurements: Height: 6' (182.9 cm) Weight: 89.8 kg (198 lb) IBW/kg (Calculated) : 77.6 Heparin Dosing Weight: 89kg  Vital Signs: Temp: 98 F (36.7 C) (03/18 1930) Temp Source: Oral (03/18 1930) BP: 124/68 (03/18 1930) Pulse Rate: 80 (03/18 1930)  Labs: Recent Labs    05/01/20 0156 05/01/20 0431 05/01/20 1019 05/01/20 1353 05/01/20 1604 05/02/20 0112 05/02/20 2028  HGB 12.9*  --   --   --   --  12.5*  --   HCT 38.1*  --   --   --   --  37.3*  --   PLT 250  --   --   --   --  171  --   APTT  --   --   --   --   --   --  67*  HEPARINUNFRC  --   --   --   --   --   --  0.39  CREATININE 2.24*  --   --   --   --  3.13*  --   TROPONINIHS 28*   < > 305* 2,988* 5,012*  --   --    < > = values in this interval not displayed.    Estimated Creatinine Clearance: 19.3 mL/min (A) (by C-G formula based on SCr of 3.13 mg/dL (H)).   Medical History: Past Medical History:  Diagnosis Date  . Acid reflux   . Aortic insufficiency   . Cholecystitis   . Combined systolic and diastolic heart failure ([place])    Echo 04/2018: inf-lat HK, EF 40-45, severe basal septal hypertrophy, mild conc LVH, Gr 1 DD, severe LAE, mild to mod TR, mod AI, asc Aorta 39 mm  . Coronary artery disease 2002   CABG x5  . Diabetes mellitus   . DVT (deep venous thrombosis) (H[facility])    previously on coumadin  . Hypercholesteremia   . Hyperlipidemia   . Mitral valve regurgitation   . Skin cancer    tumor removed off of his back     Assessment: 71 yoM with hx DVT (2019) on apixaban at home admitted with epigastric pain now s/p perc chole. Pharmacy asked to begin IV heparin.  APTT (67) and heparin level therapeutic (0.39) - appear to be close to correlating. Hg low stable, plt wnl. No bleeding or issues with infusion per discussion with RN. She reports patient had IV line changed earlier today, but  heparin was paused for <5 minutes during this time.  Goal of Therapy:  Heparin level 0.3-0.7 units/ml aPTT 66-102 seconds Monitor platelets by anticoagulation protocol: Yes   Plan:  Increase heparin slightly to 1250 units/h to ensure stays in range Check confirmatory heparin level and aPTT with AM labs - may be able to d/c aPTT and monitor using heparin levels only Monitor daily CBC, s/sx bleeding   Arturo Morton, PharmD, BCPS Please check AMION for all [place] contact numbers Clinical Pharmacist 05/02/2020 9:28 PM

## 2020-05-02 NOTE — Progress Notes (Addendum)
[place] for heparin Indication: hx DVT  No Known Allergies  Patient Measurements: Height: 6' (182.9 cm) Weight: 89.8 kg (198 lb) IBW/kg (Calculated) : 77.6 Heparin Dosing Weight: 89kg  Vital Signs: Temp: 98 F (36.7 C) (03/18 0745) Temp Source: Oral (03/18 0745) BP: 90/60 (03/18 1117) Pulse Rate: 82 (03/18 1117)  Labs: Recent Labs    05/01/20 0156 05/01/20 0431 05/01/20 1019 05/01/20 1353 05/01/20 1604 05/02/20 0112  HGB 12.9*  --   --   --   --  12.5*  HCT 38.1*  --   --   --   --  37.3*  PLT 250  --   --   --   --  171  CREATININE 2.24*  --   --   --   --  3.13*  TROPONINIHS 28*   < > 305* 2,988* 5,012*  --    < > = values in this interval not displayed.    Estimated Creatinine Clearance: 19.3 mL/min (A) (by C-G formula based on SCr of 3.13 mg/dL (H)).   Medical History: Past Medical History:  Diagnosis Date  . Acid reflux   . Aortic insufficiency   . Cholecystitis   . Combined systolic and diastolic heart failure ([place])    Echo 04/2018: inf-lat HK, EF 40-45, severe basal septal hypertrophy, mild conc LVH, Gr 1 DD, severe LAE, mild to mod TR, mod AI, asc Aorta 39 mm  . Coronary artery disease 2002   CABG x5  . Diabetes mellitus   . DVT (deep venous thrombosis) (HCC)    previously on coumadin  . Hypercholesteremia   . Hyperlipidemia   . Mitral valve regurgitation   . Skin cancer    tumor removed off of his back     Assessment: 54 yoM with hx DVT (2019) on apixaban at home admitted with epigastric pain now s/p perc chole. Pharmacy asked to begin IV heparin - will wait 2h after IR procedure per IR and cards.  Goal of Therapy:  Heparin level 0.3-0.7 units/ml aPTT 66-102 seconds Monitor platelets by anticoagulation protocol: Yes   Plan:  -Heparin 1200 units/h no bolus starting at 1300 -Check heparin level and aPTT in 8h  Arrie Senate, PharmD, [place], [facility] Clinical Pharmacist [phone] Please check AMION for  all [place] numbers 05/02/2020

## 2020-05-02 NOTE — Progress Notes (Incomplete)
36 white male  CABG X5 2002 nuke stress 2017 and ICM EF 40-45% 04/06/2018 L LE DVT 09/19/2017 Prior cholecystitis (deferred lap chole)-Normal HIDA 2019 CKD 4 baseline HLD DM TY 2  Represent [facility] ED 3/16 abdominal pain S OB, nausea/vomit Transaminitis 200 range bili 2.0 CT = GB stone RUQ USG = sludge - cholecystitis choledocholithiasis  General surgery, GI, cardiology, critical care all consulted on admission Patient confirmed DNR at bedside

## 2020-05-02 NOTE — Progress Notes (Addendum)
Date and time results received: 05/02/20 0853 (use smartphrase ".now" to insert current time)  Test: Lactic Acid Critical Value: 4.8 Name of Provider Notified: Dr. Verlon Au   Orders Received? Or Actions Taken?: Boluses going

## 2020-05-02 NOTE — Progress Notes (Addendum)
        Daily Rounding Note  05/02/2020, 10:45 AM  LOS: 1 day   SUBJECTIVE:   Chief complaint: Biliary colic, elevated transaminases.  Patient denies chest and abdominal pain, denies shortness of breath.  Being treated with high flow nasal cannula oxygen. Hemodynamically declined this AM and subsequently underwent perc cholecystostomy tube placement this AM.    OBJECTIVE:         Vital signs in last 24 hours:    Temp:  [97.8 F (36.6 C)-98.7 F (37.1 C)] 98 F (36.7 C) (03/18 0745) Pulse Rate:  [34-139] 85 (03/18 1035) Resp:  [18-35] 32 (03/18 1035) BP: (82-167)/(52-97) 107/63 (03/18 1035) SpO2:  [90 %-97 %] 93 % (03/18 1035)   Filed Weights   05/01/20 0228  Weight: 89.8 kg   General: looks ill.  Dyspneic.     Heart: RRR.  NSR in 70s Chest: dyspneic.  No cough.  Diminished but clear BS.   Abdomen: soft.  Hypoactive BS, some are tinkling quality.  ND.  Minor tenderness in RUQ.  Brown, clear, pale coffee-colored liquid in drain bag.  Drain positioned far lateral in the RUQ.    Extremities: LE edema Neuro/Psych:  Appropriate, not confused.  Calm, cooperative.    Intake/Output from previous day: 03/17 0701 - 03/18 0700 In: 1246.3 [I.V.:1093.7; IV Piggyback:152.6] Out: -   Intake/Output this shift: No intake/output data recorded.  Lab Results: Recent Labs    05/01/20 0156 05/02/20 0112  WBC 10.8* 6.3  HGB 12.9* 12.5*  HCT 38.1* 37.3*  PLT 250 171   BMET Recent Labs    05/01/20 0156 05/02/20 0112  NA 138 141  K 3.6 3.7  CL 108 108  CO2 21* 18*  GLUCOSE 137* 154*  BUN 35* 45*  CREATININE 2.24* 3.13*  CALCIUM 8.8* 8.3*   LFT Recent Labs    05/01/20 0304 05/02/20 0112  PROT 6.6 6.1*  ALBUMIN 3.3* 2.7*  AST 273* 364*  ALT 112* 191*  ALKPHOS 50 129*  BILITOT 2.0* 6.0*  BILIDIR 1.0*  --   IBILI 1.0*  --    PT/INR No results for input(s): LABPROT, INR in the last 72 hours. Hepatitis Panel Recent  Labs    05/01/20 1604  HEPBSAG NON REACTIVE  HCVAB NON REACTIVE  HEPAIGM NON REACTIVE  HEPBIGM NON REACTIVE    Studies/Results: CT ABDOMEN PELVIS WO CONTRAST  Result Date: 05/01/2020 CLINICAL DATA:  Acute nonlocalized abdominal pain. Epigastric pain and shortness of breath that began tonight EXAM: CT ABDOMEN AND PELVIS WITHOUT CONTRAST TECHNIQUE: Multidetector CT imaging of the abdomen and pelvis was performed following the standard protocol without IV contrast. COMPARISON:  10/26/2017 FINDINGS: Lower chest: Coronary atherosclerosis. Prior CABG. Fatty hiatal hernia. Calcified right hilar lymph nodes. Hepatobiliary: No focal liver abnormality.Cholelithiasis. No evidence of acute cholecystitis. Pancreas: Unremarkable. Spleen: Unremarkable. Adrenals/Urinary Tract: Negative adrenals. Symmetric renal atrophy. Renal sinus cysts on both sides. No hydronephrosis. Moderate distension of the urinary bladder with generalized wall thickening. Stomach/Bowel: No obstruction. No visible bowel inflammation. Mild distal colonic diverticulosis. Vascular/Lymphatic: No acute vascular abnormality. Atheromatous calcification of the aorta and branch vessels. No mass or adenopathy. Reproductive:Symmetric enlargement of the prostate which uplifts the bladder base. Other: No ascites or pneumoperitoneum. Shallow fatty right inguinal hernia suggested on coronal reformats. There could be some fat herniation along the upper right femoral ring. Musculoskeletal: No acute abnormalities. L5 chronic pars defects with L5-S1 advanced disc degeneration, anterolisthesis, and severe biforaminal impingement. Spinal degeneration is generalized in   the lower thoracic and lumbar spine. IMPRESSION: 1. No acute finding or specific cause of symptoms. 2. Cholelithiasis. 3. Enlarged prostate with findings of chronic outlet obstruction affecting the bladder. 4.  Aortic Atherosclerosis (ICD10-I70.0). Electronically Signed   By: Monte Fantasia M.D.   On:  05/01/2020 05:30   DG Chest 2 View  Result Date: 05/01/2020 CLINICAL DATA:  Epigastric pain, short of breath EXAM: CHEST - 2 VIEW COMPARISON:  02/18/2015, 11/04/2017 FINDINGS: Frontal and lateral views of the chest demonstrates stable postsurgical changes from median sternotomy. The cardiac silhouette is unremarkable. Lung volumes are diminished, with bibasilar consolidation, left greater than right, likely representing atelectasis. No effusion or pneumothorax. No acute bony abnormalities. IMPRESSION: 1. Low lung volumes, with bibasilar consolidation likely reflecting atelectasis. Electronically Signed   By: Randa Ngo M.D.   On: 05/01/2020 02:16   NM Hepato W/EF  Result Date: 05/01/2020 CLINICAL DATA:  Acute abdominal pain. Elevated LFTs and bilirubin level. EXAM: NUCLEAR MEDICINE HEPATOBILIARY IMAGING TECHNIQUE: Sequential images of the abdomen were obtained out to 60 minutes following intravenous administration of radiopharmaceutical. RADIOPHARMACEUTICALS:  7.6 mCi Tc-74m Choletec IV COMPARISON:  None. FINDINGS: Prompt uptake of activity by the liver is seen. After 1 hour of imaging there is persistent diffuse increased tracer uptake throughout the liver with no biliary activity identified. No gallbladder activity is visualized. IMPRESSION: Diffuse and persistent radiotracer activity identified throughout the liver with no signs of biliary activity after 1 hour of imaging. Differential consideration of these findings include hepatocellular dysfunction secondary to hepatitis versus high-grade bile duct obstruction. No gallbladder activity visualized. Electronically Signed   By: TKerby MoorsM.D.   On: 05/01/2020 13:17   UKoreaAbdomen Limited RUQ (LIVER/GB)  Result Date: 05/01/2020 CLINICAL DATA:  Right upper quadrant pain. EXAM: ULTRASOUND ABDOMEN LIMITED RIGHT UPPER QUADRANT COMPARISON:  Ultrasound renal 03/18/2020, CT abdomen pelvis 10/26/2017, ultrasound abdomen 09/28/2017 FINDINGS: Gallbladder:  Gallbladder sludge. No gallstones. No gallbladder wall thickening or pericholecystic fluid. No sonographic Murphy sign noted by sonographer. Common bile duct: Diameter: 5 mm. Liver: Limited evaluation with no definite focal lesion identified. Slightly increased parenchymal echogenicity. Portal vein is patent on color Doppler imaging with normal direction of blood flow towards the liver. Other: None. IMPRESSION: 1. Gallbladder sludge with no findings of acute cholecystitis or choledocholithiasis. 2. Hepatic steatosis. Please note limited evaluation for focal hepatic masses in a patient with hepatic steatosis due to decreased penetration of the acoustic ultrasound waves. Electronically Signed   By: MIven FinnM.D.   On: 05/01/2020 04:24    ASSESMENT:   *   Abd pain, N/V.  Elevated LFTs (just transaminases) at arrival.  Known cholelithiasis and s/p perc cholecystostomy tube placement 01/2015 - 03/2015.  Abnormal HIDA scan.  Gallbladder sludge without acute cholecystitis, no choledocholithiasis on ultrasound and CT. Acute hepatitis serologies negative. Day 2 Zosyn. Progressive sepsis, rising LFTs now involving T bili and alk phos T bili 2 >> 6.  Alk phos 50 >> 129.  AST ALT 273/112 >> 364/191.  *    Elevated troponins.  SVT.  Ruled in for NSTEMI by cardiology.  *    Chronic Eliquis for hx DVT, no PE 09/2017. Held, last dose PM 3/16.    Plan is restart Heparin in a few hours.    *    stage IV CKD.    PLAN   *   Follow LFTs and clinical status post perc biliary drain placement  *  Orders place for advance as tolerated  diet starting w clears, progressing to carb mod.    Sarah Gribbin  05/02/2020, 10:45 AM Phone [phone]  GI ATTENDING  Interval history data reviewed.  Radiologic intervention reviewed.  Surgical input reviewed.  Patient seen and examined.  Agree with interval progress note as outlined above.  Patient is now status post percutaneous cholecystostomy tube for likely  evolving cholecystitis.  On antibiotics.  Continue with supportive care, antibiotics, and monitoring of drainage along with laboratories.  We will follow.  John N. Perry, Jr., M.D. [facility] Division of Gastroenterology 

## 2020-05-02 NOTE — Progress Notes (Signed)
   05/02/20 0610  Assess: MEWS Score  Temp 97.9 F (36.6 C)  BP (!) 82/52  Pulse Rate 87  ECG Heart Rate 88  Resp (!) 25  Level of Consciousness Alert  SpO2 90 %  O2 Device Nasal Cannula  O2 Flow Rate (L/min) 6 L/min  Assess: MEWS Score  MEWS Temp 0  MEWS Systolic 1  MEWS Pulse 0  MEWS RR 1  MEWS LOC 0  MEWS Score 2  MEWS Score Color Yellow  Assess: if the MEWS score is Yellow or Red  Were vital signs taken at a resting state? Yes  Focused Assessment No change from prior assessment  Early Detection of Sepsis Score *See Row Information* Low  MEWS guidelines implemented *See Row Information* Yes  Treat  MEWS Interventions Escalated (See documentation below)  Pain Scale 0-10  Pain Score 0  Take Vital Signs  Increase Vital Sign Frequency  Yellow: Q 2hr X 2 then Q 4hr X 2, if remains yellow, continue Q 4hrs  Escalate  MEWS: Escalate Yellow: discuss with charge nurse/RN and consider discussing with provider and RRT  Notify: Charge Nurse/RN  Name of Charge Nurse/RN Notified Lyda Kalata, RN  Date Charge Nurse/RN Notified 05/02/20  Time Charge Nurse/RN Notified [phone]  Notify: Provider  Provider Name/Title Ander Slade  Date Provider Notified 05/02/20  Time Provider Notified [phone]  Notification Type Page  Notification Reason Change in status  Provider response See new orders  Date of Provider Response 05/02/20  Time of Provider Response [phone]  Document  Patient Outcome Not stable and remains on department  Progress note created (see row info) Yes

## 2020-05-03 ENCOUNTER — Inpatient Hospital Stay (HOSPITAL_COMMUNITY): Payer: PPO

## 2020-05-03 DIAGNOSIS — I214 Non-ST elevation (NSTEMI) myocardial infarction: Secondary | ICD-10-CM | POA: Diagnosis not present

## 2020-05-03 DIAGNOSIS — R1011 Right upper quadrant pain: Secondary | ICD-10-CM

## 2020-05-03 DIAGNOSIS — K8021 Calculus of gallbladder without cholecystitis with obstruction: Secondary | ICD-10-CM | POA: Diagnosis not present

## 2020-05-03 DIAGNOSIS — R7989 Other specified abnormal findings of blood chemistry: Secondary | ICD-10-CM | POA: Diagnosis not present

## 2020-05-03 LAB — APTT: aPTT: 59 seconds — ABNORMAL HIGH (ref 24–36)

## 2020-05-03 LAB — CBC
HCT: 32.7 % — ABNORMAL LOW (ref 39.0–52.0)
Hemoglobin: 10.9 g/dL — ABNORMAL LOW (ref 13.0–17.0)
MCH: 30.4 pg (ref 26.0–34.0)
MCHC: 33.3 g/dL (ref 30.0–36.0)
MCV: 91.1 fL (ref 80.0–100.0)
Platelets: 131 10*3/uL — ABNORMAL LOW (ref 150–400)
RBC: 3.59 MIL/uL — ABNORMAL LOW (ref 4.22–5.81)
RDW: 14.5 % (ref 11.5–15.5)
WBC: 26 10*3/uL — ABNORMAL HIGH (ref 4.0–10.5)
nRBC: 0 % (ref 0.0–0.2)

## 2020-05-03 LAB — COMPREHENSIVE METABOLIC PANEL
ALT: 321 U/L — ABNORMAL HIGH (ref 0–44)
AST: 617 U/L — ABNORMAL HIGH (ref 15–41)
Albumin: 2.4 g/dL — ABNORMAL LOW (ref 3.5–5.0)
Alkaline Phosphatase: 15 U/L — ABNORMAL LOW (ref 38–126)
Anion gap: 14 (ref 5–15)
BUN: 65 mg/dL — ABNORMAL HIGH (ref 8–23)
CO2: 19 mmol/L — ABNORMAL LOW (ref 22–32)
Calcium: 7.5 mg/dL — ABNORMAL LOW (ref 8.9–10.3)
Chloride: 107 mmol/L (ref 98–111)
Creatinine, Ser: 4.11 mg/dL — ABNORMAL HIGH (ref 0.61–1.24)
GFR, Estimated: 14 mL/min — ABNORMAL LOW (ref >60)
Glucose, Bld: 170 mg/dL — ABNORMAL HIGH (ref 70–99)
Potassium: 3.7 mmol/L (ref 3.5–5.1)
Sodium: 140 mmol/L (ref 135–145)
Total Bilirubin: 3.2 mg/dL — ABNORMAL HIGH (ref 0.3–1.2)
Total Protein: 5.6 g/dL — ABNORMAL LOW (ref 6.5–8.1)

## 2020-05-03 LAB — GLUCOSE, CAPILLARY
Glucose-Capillary: 144 mg/dL — ABNORMAL HIGH (ref 70–99)
Glucose-Capillary: 147 mg/dL — ABNORMAL HIGH (ref 70–99)
Glucose-Capillary: 210 mg/dL — ABNORMAL HIGH (ref 70–99)
Glucose-Capillary: 238 mg/dL — ABNORMAL HIGH (ref 70–99)
Glucose-Capillary: 228 mg/dL — ABNORMAL HIGH (ref 70–99)
Glucose-Capillary: 238 mg/dL — ABNORMAL HIGH (ref 70–99)

## 2020-05-03 LAB — MAGNESIUM: Magnesium: 0.8 mg/dL — CL (ref 1.7–2.4)

## 2020-05-03 LAB — SODIUM, URINE, RANDOM: Sodium, Ur: 40 mmol/L

## 2020-05-03 LAB — HEPARIN LEVEL (UNFRACTIONATED): Heparin Unfractionated: 0.27 IU/mL — ABNORMAL LOW (ref 0.30–0.70)

## 2020-05-03 LAB — CREATININE, URINE, RANDOM: Creatinine, Urine: 125.16 mg/dL

## 2020-05-03 MED ORDER — BISOPROLOL FUMARATE 5 MG PO TABS
2.5000 mg | ORAL_TABLET | Freq: Every day | ORAL | Status: DC
  Administered 2020-05-03: 2.5 mg via ORAL
  Filled 2020-05-03: qty 0.5

## 2020-05-03 MED ORDER — MAGNESIUM SULFATE 2 GM/50ML IV SOLN
2.0000 g | Freq: Once | INTRAVENOUS | Status: AC
  Administered 2020-05-03: 2 g via INTRAVENOUS
  Filled 2020-05-03: qty 50

## 2020-05-03 MED ORDER — SODIUM BICARBONATE 8.4 % IV SOLN
INTRAVENOUS | Status: DC
  Filled 2020-05-03 (×4): qty 100

## 2020-05-03 MED ORDER — PHENOL 1.4 % MT LIQD: 1.0000 | OROMUCOSAL | Status: DC | PRN

## 2020-05-03 MED ORDER — SODIUM CHLORIDE 0.9 % IV BOLUS
500.0000 mL | Freq: Once | INTRAVENOUS | Status: AC
  Administered 2020-05-03: 500 mL via INTRAVENOUS

## 2020-05-03 MED ORDER — MENTHOL 3 MG MT LOZG
1.0000 | LOZENGE | OROMUCOSAL | Status: DC | PRN
  Administered 2020-05-03: 3 mg via ORAL
  Filled 2020-05-03: qty 9

## 2020-05-03 MED ORDER — SODIUM BICARBONATE 8.4 % IV SOLN: INTRAVENOUS | Status: DC

## 2020-05-03 MED ORDER — CHLORHEXIDINE GLUCONATE CLOTH 2 % EX PADS
6.0000 | MEDICATED_PAD | Freq: Every day | CUTANEOUS | Status: DC
  Administered 2020-05-03 – 2020-05-05 (×3): 6 via TOPICAL

## 2020-05-03 NOTE — Progress Notes (Signed)
Progress Note  Patient Name: Nathan [name] Date of Encounter: 05/03/2020  Primary Cardiologist: [name], MD  Subjective   No anginal chest pain, some pleuritic right lower costal discomfort with deep breath.  No nausea or emesis.  Has taken in some p.o.'s.  Inpatient Medications    Scheduled Meds: . aspirin EC  81 mg Oral Daily  . insulin aspart  0-6 Units Subcutaneous Q4H  . sodium chloride flush  5 mL Intracatheter Q8H   Continuous Infusions: . sodium chloride Stopped (05/03/20 0328)  . heparin 1,250 Units/hr (05/03/20 0330)  . piperacillin-tazobactam (ZOSYN)  IV 2.25 g (05/03/20 0650)  . sodium bicarbonate in D5W 1000 mL infusion 100 mL/hr at 05/03/20 0811   PRN Meds: sodium chloride, acetaminophen **OR** acetaminophen, albuterol, hydrALAZINE, metoprolol tartrate, morphine injection, ondansetron **OR** ondansetron (ZOFRAN) IV   Vital Signs    Vitals:   05/02/20 1930 05/02/20 2328 05/03/20 0330 05/03/20 0742  BP: 124/68 115/81 128/73 (!) 144/88  Pulse: 80 82 76 80  Resp: (!) 26 20 (!) 26 20  Temp: 98 F (36.7 C) 98.2 F (36.8 C) 97.6 F (36.4 C) 97.6 F (36.4 C)  TempSrc: Oral Oral Oral   SpO2: 92% 93% 94% 92%  Weight:      Height:        Intake/Output Summary (Last 24 hours) at 05/03/2020 0840 Last data filed at 05/03/2020 8527 Gross per 24 hour  Intake 1197.47 ml  Output 520 ml  Net 677.47 ml   Filed Weights   05/01/20 0228  Weight: 89.8 kg    Telemetry    Sinus rhythm.  Personally reviewed.  ECG    An ECG dated 05/01/2020 was personally reviewed today and demonstrated:  Sinus rhythm with prolonged PR interval, right bundle branch block and left anterior fascicular block.  Physical Exam   GEN:  Elderly male.  No acute distress.   Neck: No JVD. Cardiac: RRR, soft systolic murmur, no gallop.  Respiratory: Nonlabored. Clear to auscultation bilaterally. GI: Soft, bowel sounds present, percutaneous cholecystostomy tube dressed.. MS: No  edema; No deformity. Neuro:  Nonfocal. Psych: Alert and oriented x 3. Normal affect.  Labs    Chemistry Recent Labs  Lab 05/01/20 0156 05/01/20 0304 05/02/20 0112 05/03/20 0107  NA 138  --  141 140  K 3.6  --  3.7 3.7  CL 108  --  108 107  CO2 21*  --  18* 19*  GLUCOSE 137*  --  154* 170*  BUN 35*  --  45* 65*  CREATININE 2.24*  --  3.13* 4.11*  CALCIUM 8.8*  --  8.3* 7.5*  PROT  --  6.6 6.1* 5.6*  ALBUMIN  --  3.3* 2.7* 2.4*  AST  --  273* 364* 617*  ALT  --  112* 191* 321*  ALKPHOS  --  50 129* 15*  BILITOT  --  2.0* 6.0* 3.2*  GFRNONAA 28*  --  19* 14*  ANIONGAP 9  --  15 14     Hematology Recent Labs  Lab 05/01/20 0156 05/02/20 0112 05/03/20 0107  WBC 10.8* 6.3 26.0*  RBC 4.21* 4.10* 3.59*  HGB 12.9* 12.5* 10.9*  HCT 38.1* 37.3* 32.7*  MCV 90.5 91.0 91.1  MCH 30.6 30.5 30.4  MCHC 33.9 33.5 33.3  RDW 13.6 14.1 14.5  PLT 250 171 131*    Cardiac Enzymes Recent Labs  Lab 05/01/20 0156 05/01/20 0431 05/01/20 1019 05/01/20 1353 05/01/20 1604  TROPONINIHS 28* 24* 305* 2,988*  [place].Re[place]*    Radiology    NM Hepato W/EF  Result Date: 05/01/2020 CLINICAL DATA:  Acute abdominal pain. Elevated LFTs and bilirubin level. EXAM: NUCLEAR MEDICINE HEPATOBILIARY IMAGING TECHNIQUE: Sequential images of the abdomen were obtained out to 60 minutes following intravenous administration of radiopharmaceutical. RADIOPHARMACEUTICALS:  7.6 mCi Tc-33m  Choletec IV COMPARISON:  None. FINDINGS: Prompt uptake of activity by the liver is seen. After 1 hour of imaging there is persistent diffuse increased tracer uptake throughout the liver with no biliary activity identified. No gallbladder activity is visualized. IMPRESSION: Diffuse and persistent radiotracer activity identified throughout the liver with no signs of biliary activity after 1 hour of imaging. Differential consideration of these findings include hepatocellular dysfunction secondary to hepatitis versus high-grade bile duct  obstruction. No gallbladder activity visualized. Electronically Signed   By: Kerby Moors M.D.   On: 05/01/2020 13:17   IR Perc Cholecystostomy  Result Date: 05/02/2020 INDICATION: 87 year old male referred for percutaneous cholecystostomy EXAM: CHOLECYSTOSTOMY; IR ULTRASOUND GUIDANCE MEDICATIONS: None ANESTHESIA/SEDATION: Moderate (conscious) sedation was employed during this procedure. A total of Versed 1.0 mg and Fentanyl 25 mcg was administered intravenously. Moderate Sedation Time: 12 minutes. The patient's level of consciousness and vital signs were monitored continuously by radiology nursing throughout the procedure under my direct supervision. FLUOROSCOPY TIME:  Fluoroscopy Time: 0 minutes 6 seconds (7 mGy). COMPLICATIONS: None PROCEDURE: Informed written consent was obtained from the patient and the patient's family after a thorough discussion of the procedural risks, benefits and alternatives. All questions were addressed. Maximal Sterile Barrier Technique was utilized including caps, mask, sterile gowns, sterile gloves, sterile drape, hand hygiene and skin antiseptic. A timeout was performed prior to the initiation of the procedure. Ultrasound survey of the right upper quadrant was performed for planning purposes. Once the patient is prepped and draped in the usual sterile fashion, the skin and subcutaneous tissues overlying the gallbladder were generously infiltrated 1% lidocaine for local anesthesia. A coaxial needle was advanced under ultrasound guidance through the skin subcutaneous tissues and a small segment of liver into the gallbladder lumen. With removal of the stylet, spontaneous dark bile drainage occurred. Using modified Seldinger technique, a 10 French drain was placed into the gallbladder fossa, with aspiration of the sample for the lab. Contrast injection confirmed position of the tube within the gallbladder lumen. Drainage catheter was attached to gravity drain with a suture  retention placed. Patient tolerated the procedure well and remained hemodynamically stable throughout. No complications were encountered and no significant blood loss encountered. IMPRESSION: Status post percutaneous cholecystostomy. Signed, Dulcy Fanny. Dellia Nims, RPVI [facility] Radiology Electronically Signed   By: Corrie Mckusick D.O.   On: 05/02/2020 11:20   IR US Guidance  Result Date: 05/02/2020 INDICATION: 87 year old male referred for percutaneous cholecystostomy EXAM: CHOLECYSTOSTOMY; IR ULTRASOUND GUIDANCE MEDICATIONS: None ANESTHESIA/SEDATION: Moderate (conscious) sedation was employed during this procedure. A total of Versed 1.0 mg and Fentanyl 25 mcg was administered intravenously. Moderate Sedation Time: 12 minutes. The patient's level of consciousness and vital signs were monitored continuously by radiology nursing throughout the procedure under my direct supervision. FLUOROSCOPY TIME:  Fluoroscopy Time: 0 minutes 6 seconds (7 mGy). COMPLICATIONS: None PROCEDURE: Informed written consent was obtained from the patient and the patient's family after a thorough discussion of the procedural risks, benefits and alternatives. All questions were addressed. Maximal Sterile Barrier Technique was utilized including caps, mask, sterile gowns, sterile gloves, sterile drape, hand hygiene and skin antiseptic. A timeout was performed  prior to the initiation of the procedure. Ultrasound survey of the right upper quadrant was performed for planning purposes. Once the patient is prepped and draped in the usual sterile fashion, the skin and subcutaneous tissues overlying the gallbladder were generously infiltrated 1% lidocaine for local anesthesia. A coaxial needle was advanced under ultrasound guidance through the skin subcutaneous tissues and a small segment of liver into the gallbladder lumen. With removal of the stylet, spontaneous dark bile drainage occurred. Using  modified Seldinger technique, a 10 French drain was placed into the gallbladder fossa, with aspiration of the sample for the lab. Contrast injection confirmed position of the tube within the gallbladder lumen. Drainage catheter was attached to gravity drain with a suture retention placed. Patient tolerated the procedure well and remained hemodynamically stable throughout. No complications were encountered and no significant blood loss encountered. IMPRESSION: Status post percutaneous cholecystostomy. Signed, Dulcy Fanny. Dellia Nims, RPVI [facility] Radiology Electronically Signed   By: Corrie Mckusick D.O.   On: 05/02/2020 11:20   ECHOCARDIOGRAM COMPLETE  Result Date: 05/02/2020    ECHOCARDIOGRAM REPORT   Patient Name:   EWARD RUTIGLIANO Date of Exam: 05/02/2020 Medical Rec #:  [id number]    Height:       72.0 in Accession #:    [id number]   Weight:       198.0 lb Date of Birth:  1933-12-27    BSA:          2.121 m Patient Age:    7 years     BP:           100/61 mmHg Patient Gender: M            HR:           79 bpm. Exam Location:  Inpatient Procedure: 2D Echo Indications:    NSTEMI  History:        Patient has prior history of Echocardiogram examinations, most                 recent 04/26/2018. CAD, chronic kidney disease; Risk                 Factors:Dyslipidemia.  Sonographer:    Johny Chess Referring Phys: [id number] [place] A CHANDRASEKHAR IMPRESSIONS  1. Left ventricular ejection fraction, by estimation, is 40 to 45%. The left ventricle has mildly decreased function. The left ventricle demonstrates regional wall motion abnormalities (see scoring diagram/findings for description). Left ventricular diastolic parameters are indeterminate.  2. Right ventricular systolic function is mildly reduced. The right ventricular size is mildly enlarged. There is mildly elevated pulmonary artery systolic pressure. The IVC is not well seen, but using an estimated RA pressure of 8,  the estimated right ventricular systolic pressure is 88.4 mmHg.  3. Left atrial size was moderately dilated.  4. Mitral regurgitation is anteriorly directed and eccentric, and may be underestimated. There is a small flail segment best seen in parasternal long axis views. . The mitral valve is degenerative. Mild to moderate mitral valve regurgitation. No evidence of mitral stenosis.  5. Tricuspid valve regurgitation is severe.  6. The aortic valve is abnormal. There is moderate calcification of the aortic valve. Aortic valve regurgitation is mild to moderate. No aortic stenosis is present. FINDINGS  Left Ventricle: Left ventricular ejection fraction, by estimation, is 40 to 45%. The left ventricle has mildly decreased function. The left ventricle demonstrates regional wall motion abnormalities. The left ventricular internal cavity size was normal in  size. There is no left ventricular hypertrophy. Left ventricular diastolic parameters are indeterminate.  LV Wall Scoring: The mid and distal lateral wall and mid anterolateral segment are hypokinetic. Right Ventricle: The right ventricular size is mildly enlarged. Right vetricular wall thickness was not well visualized. Right ventricular systolic function is mildly reduced. There is mildly elevated pulmonary artery systolic pressure. The tricuspid regurgitant velocity is 2.85 m/s, and with an assumed right atrial pressure of 8 mmHg, the estimated right ventricular systolic pressure is 90.2 mmHg. Left Atrium: Left atrial size was moderately dilated. Right Atrium: Right atrial size was normal in size. Pericardium: Trivial pericardial effusion is present. Mitral Valve: Mitral regurgitation is anteriorly directed and eccentric, and may be underestimated. There is a small flail segment best seen in parasternal long axis views. The mitral valve is degenerative in appearance. Mild to moderate mitral valve regurgitation. No evidence of mitral valve stenosis. Tricuspid Valve: The  tricuspid valve is normal in structure. Tricuspid valve regurgitation is severe. Aortic Valve: Vena contracta 0.4 cm. The aortic valve is abnormal. There is moderate calcification of the aortic valve. Aortic valve regurgitation is mild to moderate. Aortic regurgitation PHT measures 231 msec. No aortic stenosis is present. Pulmonic Valve: The pulmonic valve was grossly normal. Pulmonic valve regurgitation is trivial. Aorta: The aortic root is normal in size and structure. Venous: The inferior vena cava was not well visualized. IAS/Shunts: The interatrial septum was not well visualized.  LEFT VENTRICLE PLAX 2D LVIDd:         5.20 cm     Diastology LVIDs:         4.10 cm     LV e' medial:    8.59 cm/s LV PW:         1.20 cm     LV E/e' medial:  8.6 LV IVS:        1.00 cm     LV e' lateral:   8.27 cm/s LVOT diam:     2.30 cm     LV E/e' lateral: 8.9 LV SV:         76 LV SV Index:   36 LVOT Area:     4.15 cm  LV Volumes (MOD) LV vol d, MOD A4C: 74.2 ml LV vol s, MOD A4C: 44.4 ml LV SV MOD A4C:     74.2 ml RIGHT VENTRICLE RV S prime:     10.80 cm/s TAPSE (M-mode): 1.3 cm LEFT ATRIUM             Index       RIGHT ATRIUM           Index LA diam:        4.60 cm 2.17 cm/m  RA Area:     16.50 cm LA Vol (A2C):   98.7 ml 46.52 ml/m RA Volume:   44.50 ml  20.98 ml/m LA Vol (A4C):   73.4 ml 34.60 ml/m LA Biplane Vol: 88.8 ml 41.86 ml/m  AORTIC VALVE LVOT Vmax:   76.70 cm/s LVOT Vmean:  49.300 cm/s LVOT VTI:    0.183 m AI PHT:      231 msec  AORTA Ao Root diam: 3.50 cm MITRAL VALVE               TRICUSPID VALVE MV Area (PHT): 5.13 cm    TR Peak grad:   32.5 mmHg MV Decel Time: 148 msec    TR Vmax:        285.00 cm/s MV E velocity:  73.80 cm/s MV A velocity: 58.00 cm/s  SHUNTS MV E/A ratio:  1.27        Systemic VTI:  0.18 m                            Systemic Diam: 2.30 cm Cherlynn Kaiser MD Electronically signed by Cherlynn Kaiser MD Signature Date/Time: 05/02/2020/4:19:00 PM    Final     Patient Profile     87 y.o.  male with a history of CAD status post CABG, hypertension, hyperlipidemia, type 2 diabetes mellitus, CKD stage IV, and chronic combined heart failure now presenting with cholecystitis and type II NSTEMI.  Assessment & Plan    1.  NSTEMI, likely type II event in the setting of physiologic stressors.  High-sensitivity troponin I up to 5012.  No active chest pain.  ECG shows right bundle branch block and left anterior fascicular block.  Currently on aspirin and heparin.  2.  Ischemic cardiomyopathy with LVEF 40 to 45%.  Anterolateral wall motion normalities noted.  3.  Valvular heart disease including mild to moderate mitral regurgitation and mild to moderate aortic regurgitation.  4.  Multivessel CAD status post CABG.  Graft disease documented in 2020 including an occluded SVG to the diagonal and SVG to the OM.  SVG to PDA and sequentially PLA was found to be patent as was the LIMA to LAD.  He has been managed medically.  5.  CKD stage IV-V, current creatinine 4.11.  Cardiac catheterization not being pursued at this point in light of very high risk of contrast nephropathy and potential need for hemodialysis.  6.  PSVT, rhythm has been stable by telemetry.  Continue aspirin and heparin.  Start bisoprolol 2.5 mg daily and titrate as tolerated.  Not candidate for ARB/ANRI/Aldactone.  Would also hold off on statin for now with climbing LFTs, although this will most likely be a consideration eventually.  Signed, Rozann Lesches, MD  05/03/2020, 8:40 AM

## 2020-05-03 NOTE — Progress Notes (Signed)
ANTICOAGULATION CONSULT NOTE  Pharmacy Consult for heparin Indication: hx DVT  No Known Allergies  Patient Measurements: Height: 6' (182.9 cm) Weight: 89.8 kg (198 lb) IBW/kg (Calculated) : 77.6 Heparin Dosing Weight: 89kg  Vital Signs: Temp: 97.6 F (36.4 C) (03/19 1104) Temp Source: Oral (03/19 1104) BP: 140/82 (03/19 1104) Pulse Rate: 71 (03/19 1104)  Labs: Recent Labs    05/01/20 0156 05/01/20 0431 05/01/20 1019 05/01/20 1353 05/01/20 1604 05/02/20 0112 05/02/20 2028 05/03/20 0107 05/03/20 1325  HGB 12.9*  --   --   --   --  12.5*  --  10.9*  --   HCT 38.1*  --   --   --   --  37.3*  --  32.7*  --   PLT 250  --   --   --   --  171  --  131*  --   APTT  --   --   --   --   --   --  67*  --  59*  HEPARINUNFRC  --   --   --   --   --   --  0.39  --  0.27*  CREATININE 2.24*  --   --   --   --  3.13*  --  4.11*  --   TROPONINIHS 28*   < > 305* 2,988* 5,012*  --   --   --   --    < > = values in this interval not displayed.    Estimated Creatinine Clearance: 14.7 mL/min (A) (by C-G formula based on SCr of 4.11 mg/dL (H)).   Medical History: Past Medical History:  Diagnosis Date  . Acid reflux   . Aortic insufficiency   . Cholecystitis   . Combined systolic and diastolic heart failure ([place])    Echo 04/2018: inf-lat HK, EF 40-45, severe basal septal hypertrophy, mild conc LVH, Gr 1 DD, severe LAE, mild to mod TR, mod AI, asc Aorta 39 mm  . Coronary artery disease 2002   CABG x5  . Diabetes mellitus   . DVT (deep venous thrombosis) ([facility])    previously on coumadin  . Hypercholesteremia   . Hyperlipidemia   . Mitral valve regurgitation   . Skin cancer    tumor removed off of his back     Assessment: [name] with hx DVT (2019) on apixaban at home admitted with epigastric pain now s/p perc chole. Pharmacy asked to begin IV heparin.  Heparin drip 1250 uts/hr HL 0.27 and aptt 67sec correlating and apixaban nowe washed out. Will use heparin level for heparin  dosing now. Hg low stable, plt wnl. No bleeding or issues with infusion per discussion with RN. .  Goal of Therapy:  Heparin level 0.3-0.7 units/ml aPTT 66-102 seconds Monitor platelets by anticoagulation protocol: Yes   Plan:  Increase heparin slightly to 1300 units/h  Daily heparin level and aPTT with AM labs -  Monitor daily CBC, s/sx bleeding   Bonnita Nasuti Pharm.D. CPP, BCPS Clinical Pharmacist [phone] 05/03/2020 3:25 PM    Please check AMION for all [place] contact numbers Clinical Pharmacist 05/03/2020 3:23 PM

## 2020-05-03 NOTE — Progress Notes (Signed)
HISTORY OF PRESENT ILLNESS:  Nathan [name] is a 87 y.o. male who presented with acute abdominal pain and elevated liver tests.  Work-up pointed to cholecystitis for which she is now status post cholecystostomy tube.  Patient tells me that he is feeling much better since his admission.  He is tolerating his current diet.  His wife is at the bedside.  Bilious material in drain.  LABORATORIES: White blood cell count 26,000.  Patient has been afebrile Transaminases up a bit.  Bilirubin down a bit  REVIEW OF SYSTEMS:  All non-GI ROS negative except for  Past Medical History:  Diagnosis Date  . Acid reflux   . Aortic insufficiency   . Cholecystitis   . Combined systolic and diastolic heart failure ([place])    Echo 04/2018: inf-lat HK, EF 40-45, severe basal septal hypertrophy, mild conc LVH, Gr 1 DD, severe LAE, mild to mod TR, mod AI, asc Aorta 39 mm  . Coronary artery disease 2002   CABG x5  . Diabetes mellitus   . DVT (deep venous thrombosis) ([facility])    previously on coumadin  . Hypercholesteremia   . Hyperlipidemia   . Mitral valve regurgitation   . Skin cancer    tumor removed off of his back    Past Surgical History:  Procedure Laterality Date  . BILIARY DRAINAGE CATHETER PLACEMENT W/ BILE DUCT TUBE CHANGE  01/2015  . CORONARY ARTERY BYPASS GRAFT  12/21/2000   x5  . IR PERC CHOLECYSTOSTOMY  05/02/2020  . IR US GUIDANCE  05/02/2020  . LEFT HEART CATH AND CORS/GRAFTS ANGIOGRAPHY N/A 04/06/2018   Procedure: LEFT HEART CATH AND CORS/GRAFTS ANGIOGRAPHY;  Surgeon: Lorretta Harp, MD;  Location: [facility];  Service: Cardiovascular;  Laterality: N/A;  . SKIN CANCER EXCISION    . TUMOR REMOVAL     off of back, skin cancer    Social History Nathan [name]  reports that he has never smoked. He has never used smokeless tobacco. He reports that he does not drink alcohol and does not use drugs.  family history includes Cancer in his brother and sister; Diabetes in his brother; Heart  attack in his father; Hypertension in his mother.  No Known Allergies     PHYSICAL EXAMINATION: Vital signs: BP 140/82 (BP Location: Right Arm)   Pulse 71   Temp 97.6 F (36.4 C) (Oral)   Resp 18   Ht 6' (1.829 m)   Wt 89.8 kg   SpO2 93%   BMI 26.85 kg/m   Constitutional: generally well-appearing, no acute distress Psychiatric: alert and oriented x3, cooperative Eyes: extraocular movements intact, faint icterus, conjunctiva pink Mouth: oral pharynx moist, no lesions Neck: supple no lymphadenopathy Cardiovascular: heart regular rate and rhythm, no murmur Lungs: clear to auscultation bilaterally Abdomen: soft, nontender, nondistended, no obvious ascites, no peritoneal signs, normal bowel sounds, no organomegaly.  Cholecystostomy tube in place Extremities: no clubbing or cyanosis.  Trace lower extremity edema bilaterally Skin: no lesions on visible extremities Neuro: No focal deficits.  Cranial nerves intact  ASSESSMENT:  1.  Acute calculus cholecystitis status post cholecystostomy tube.  On antibiotics.  Feeling better.  Nontoxic-appearing with benign abdomen 2.  Elevated white count and liver tests.  Presumably due to the same 3.  Multiple medical problems  PLAN:  1.  Continue antibiotics 2.  Continue to monitor drain output 3.  Continue to monitor patient's clinical status 4.  Continue to trend laboratories 5.  If at any point bile duct  obstruction is a consideration, then proceeding to MRCP would be reasonable to rule out choledocholithiasis. I discussed this with the patient and his wife.  We will continue to follow.  Docia Chuck. Geri Seminole., M.D. [facility] Division of Gastroenterology

## 2020-05-03 NOTE — Progress Notes (Signed)
IW:LNLGX abdominal pain, n/v with cholelithiasis, elevated wbc and lft's   Subjective: Eating breakfast. Denies pain or nausea currently.     Objective: Vital signs in last 24 hours: Temp:  [97.6 F (36.4 C)-98.2 F (36.8 C)] 97.6 F (36.4 C) (03/19 0330) Pulse Rate:  [30-90] 76 (03/19 0330) Resp:  [19-32] 26 (03/19 0330) BP: (84-128)/(54-81) 128/73 (03/19 0330) SpO2:  [89 %-97 %] 94 % (03/19 0330)   Intake/Output from previous day: 03/18 0701 - 03/19 0700 In: 1197.5 [P.O.:240; I.V.:840.9; IV Piggyback:101.6] Out: 520 [Urine:370; Drains:150] Intake/Output this shift: No intake/output data recorded.  General appearance: alert, cooperative, fatigued and no distress Resp: clear to auscultation bilaterally and on HFNC Cardio: regular rate and rhythm; denies any chest pain GI: soft, nondistended, minimally tender RUQ. Drain output bilious Extremities: trace edema  Lab Results:  Recent Labs    05/02/20 0112 05/03/20 0107  WBC 6.3 26.0*  HGB 12.5* 10.9*  HCT 37.3* 32.7*  PLT 171 131*    BMET Recent Labs    05/02/20 0112 05/03/20 0107  NA 141 140  K 3.7 3.7  CL 108 107  CO2 18* 19*  GLUCOSE 154* 170*  BUN 45* 65*  CREATININE 3.13* 4.11*  CALCIUM 8.3* 7.5*   PT/INR No results for input(s): LABPROT, INR in the last 72 hours.  Recent Labs  Lab 05/01/20 0304 05/02/20 0112 05/03/20 0107  AST 273* 364* 617*  ALT 112* 191* 321*  ALKPHOS 50 129* 15*  BILITOT 2.0* 6.0* 3.2*  PROT 6.6 6.1* 5.6*  ALBUMIN 3.3* 2.7* 2.4*     Lipase     Component Value Date/Time   LIPASE 43 05/01/2020 0304     Medications: . aspirin EC  81 mg Oral Daily  . insulin aspart  0-6 Units Subcutaneous Q4H  . sodium chloride flush  5 mL Intracatheter Q8H    Assessment/Plan CAD s/p CABG x 5 in 2002 w/ last cath in 2020 w/ occluded SVG diagonal and SVG-marginal CHF (EF 40-45% in 2020) Hx of HTN Hx of HLD Hx of diabetes CKD acute on chronic  - creatinine Creatinine  2.24>>3.13 Hx of DVT on Eliquis  -  currently on hold, heparin gtt  2 Syncopal episodes at home - reports he lives alone. Defer workup to TRH. May need CM to see Elevated Tn's with EKG changes and tachycardia - Cardiology following. Repeat pending.    - Troponin 28>>24>>305>>[id number]>>5012   Epigastric abdominal pain, n/v Cholelithiasis/cholecystitis - Hx of perc chole drain that was placed on 01/21/2015 by IR and removed on 03/25/2015.    Right upper quadrant ultrasound with gallbladder sludge but no gallbladder wall thickening, pericholecystic fluid or Murphy sign.  CBD 5 mm.  He did have hepatic steatosis.  CT A/P showed cholelithiasis with no acute findings or specific cause of his symptoms.   - HIDA: Diffuse and persistent radiotracer activity identified throughout the liver with no signs of biliary activity after 1 hour of imaging. Differential consideration of these findings include hepatocellular dysfunction secondary to hepatitis versus high-grade bile duct obstruction. No gallbladder activity visualized. - Agree with IV Zosyn in the meantime to cover.   - s/p PCT 05/02/20. Tbili down, cr up and wbc up.    - GI following also. W abnormal hida and elevated lfts could consider mrcp if labs stall  - No plans for surgery at this time.       LOS: 2 days    Clovis Riley 05/03/2020 Please see Amion

## 2020-05-03 NOTE — Progress Notes (Signed)
MD paged over critical magnesium 0.8.  Orders received for 2g IV magnesium.

## 2020-05-03 NOTE — Progress Notes (Addendum)
PROGRESS NOTE   Nathan [name]  ZOX:[id number] DOB: Mar 03, 1933 DOA: 05/01/2020 PCP: Leanna Battles, MD  Brief Narrative:  59 white male  CABG X5 2002 nuke stress 2017 and ICM EF 40-45% 04/06/2018 L LE DVT 09/19/2017 Prior cholecystitis (deferred lap chole)-Normal HIDA 2019 CKD 4 baseline HLD DM TY 2  Represent [facility] ED 3/16 abdominal pain S OB, nausea/vomit Transaminitis 200 range bili 2.0 CT = GB stone RUQ USG = sludge - cholecystitis choledocholithiasis  General surgery, GI, cardiology, critical care all consulted on admission Patient confirmed DNR at bedside  Hospital-Problem based course  Severe gram-negative sepsis (on cholecystotomy culture) on admission with multiorgan dysfunction syndrome secondary to acute cholecystitis Much improved and coherent-bolused IV fluid 1 L this afternoon continue rate of fluid-stop bisoprolol given hypotensive bradycardic Morphine 2 mg every 2 as needed pain Continue full liquid diet today 10 French pigtail has 300 cc in it Patient confirmed to be no CODE BLUE Defer to general surgery long-term planning regarding PERC drain NSTEMI on admission troponins now 5000-likely demand ischemia Underlying CAD CABG X5 As per cardiology-on heparin/asa 81 Hydralazine changed to IV metoprolol every 2 as needed pressure over 180 Holding Imdur 30, stop bisoprolol as above reevaluate per cardiology tomorrow DVT 2019 Eliquis on hold-on IV heparin secondary to NSTEMI Worsening ATN 2/2 sepsis physiology most likely superimposed on CKD 4 fractional excretion of sodium = 0.9 indicating predominant prerenal component US kidney-no hydronephrosis bladder scan showed retention of 300 cc Foley placed with immediate return and 700 cc out Continue bicarb for now We will formally consult nephrology if continues to worsen on next Valtrex DM TY 2-diet controlled? Strict glycemic control sliding scale ordered-CBGs 1 47-2 38  DVT prophylaxis: IV heparin therapeutic  dosing Code Status: DNR confirmed Family Communication: Long discussion at the bedside with daughter I have explained that patient is better but still not out of the woods Kidney function has not improved and I hope that with bolus of fluids and resolution of sepsis physiology kidneys can improve I explained this in simple terms with diagrams to the family  Disposition:  Status is: Inpatient  Remains inpatient appropriate because:Ongoing active pain requiring inpatient pain management, Altered mental status, Ongoing diagnostic testing needed not appropriate for outpatient work up and Inpatient level of care appropriate due to severity of illness   Dispo: The patient is from: Home              Anticipated d/c is to: SNF              Patient currently is not medically stable to d/c.   Difficult to place patient No       Consultants:   Multiple as above  Procedures: Cholecystotomy 3/18  Antimicrobials: Vancomycin Zosyn   Subjective: Coherent alert much more conversant  Desat slightly Pain is nominal and right upper quadrant No lower extremity edema Follows commands Eating a little bit about 50% of his plate was touched  Objective: Vitals:   05/02/20 1600 05/02/20 1930 05/02/20 2328 05/03/20 0330  BP: 123/62 124/68 115/81 128/73  Pulse: 80 80 82 76  Resp: (!) 24 (!) 26 20 (!) 26  Temp:  98 F (36.7 C) 98.2 F (36.8 C) 97.6 F (36.4 C)  TempSrc:  Oral Oral Oral  SpO2: 92% 92% 93% 94%  Weight:      Height:        Intake/Output Summary (Last 24 hours) at 05/03/2020 0721 Last data filed at 05/03/2020 0637 Gross per 24  hour  Intake 1197.47 ml  Output 520 ml  Net 677.47 ml   Filed Weights   05/01/20 0228  Weight: 89.8 kg    Examination:  EOMI NCAT no focal deficit mucosa moist S1-S2 no murmur Slight tachycardia Abdomen soft PERC drain noted Foley in place No lower extremity edema or plethora to lower extremities upper extremities   Data Reviewed:  personally reviewed   CBC    Component Value Date/Time   WBC 26.0 (H) 05/03/2020 0107   RBC 3.59 (L) 05/03/2020 0107   HGB 10.9 (L) 05/03/2020 0107   HGB 13.6 04/04/2018 1203   HCT 32.7 (L) 05/03/2020 0107   HCT 41.1 04/04/2018 1203   PLT 131 (L) 05/03/2020 0107   PLT 189 04/04/2018 1203   MCV 91.1 05/03/2020 0107   MCV 91 04/04/2018 1203   MCH 30.4 05/03/2020 0107   MCHC 33.3 05/03/2020 0107   RDW 14.5 05/03/2020 0107   RDW 13.3 04/04/2018 1203   LYMPHSABS 4.1 (H) 02/19/2015 0305   MONOABS 1.7 (H) 02/19/2015 0305   EOSABS 0.2 02/19/2015 0305   BASOSABS 0.1 02/19/2015 0305   CMP Latest Ref Rng & Units 05/03/2020 05/02/2020 05/01/2020  Glucose 70 - 99 mg/dL 170(H) 154(H) 137(H)  BUN 8 - 23 mg/dL 65(H) 45(H) 35(H)  Creatinine 0.61 - 1.24 mg/dL 4.11(H) 3.13(H) 2.24(H)  Sodium 135 - 145 mmol/L 140 141 138  Potassium 3.5 - 5.1 mmol/L 3.7 3.7 3.6  Chloride 98 - 111 mmol/L 107 108 108  CO2 22 - 32 mmol/L 19(L) 18(L) 21(L)  Calcium 8.9 - 10.3 mg/dL 7.5(L) 8.3(L) 8.8(L)  Total Protein 6.5 - 8.1 g/dL 5.6(L) 6.1(L) 6.6  Total Bilirubin 0.3 - 1.2 mg/dL 3.2(H) 6.0(H) 2.0(H)  Alkaline Phos 38 - 126 U/L 15(L) 129(H) 50  AST 15 - 41 U/L 617(H) 364(H) 273(H)  ALT 0 - 44 U/L 321(H) 191(H) 112(H)     Radiology Studies: NM Hepato W/EF  Result Date: 05/01/2020 CLINICAL DATA:  Acute abdominal pain. Elevated LFTs and bilirubin level. EXAM: NUCLEAR MEDICINE HEPATOBILIARY IMAGING TECHNIQUE: Sequential images of the abdomen were obtained out to 60 minutes following intravenous administration of radiopharmaceutical. RADIOPHARMACEUTICALS:  7.6 mCi Tc-41m  Choletec IV COMPARISON:  None. FINDINGS: Prompt uptake of activity by the liver is seen. After 1 hour of imaging there is persistent diffuse increased tracer uptake throughout the liver with no biliary activity identified. No gallbladder activity is visualized. IMPRESSION: Diffuse and persistent radiotracer activity identified throughout the liver with  no signs of biliary activity after 1 hour of imaging. Differential consideration of these findings include hepatocellular dysfunction secondary to hepatitis versus high-grade bile duct obstruction. No gallbladder activity visualized. Electronically Signed   By: Kerby Moors M.D.   On: 05/01/2020 13:17   IR Perc Cholecystostomy  Result Date: 05/02/2020 INDICATION: 87 year old male referred for percutaneous cholecystostomy EXAM: CHOLECYSTOSTOMY; IR ULTRASOUND GUIDANCE MEDICATIONS: None ANESTHESIA/SEDATION: Moderate (conscious) sedation was employed during this procedure. A total of Versed 1.0 mg and Fentanyl 25 mcg was administered intravenously. Moderate Sedation Time: 12 minutes. The patient's level of consciousness and vital signs were monitored continuously by radiology nursing throughout the procedure under my direct supervision. FLUOROSCOPY TIME:  Fluoroscopy Time: 0 minutes 6 seconds (7 mGy). COMPLICATIONS: None PROCEDURE: Informed written consent was obtained from the patient and the patient's family after a thorough discussion of the procedural risks, benefits and alternatives. All questions were addressed. Maximal Sterile Barrier Technique was utilized including caps, mask, sterile gowns, sterile gloves, sterile drape, hand hygiene  and skin antiseptic. A timeout was performed prior to the initiation of the procedure. Ultrasound survey of the right upper quadrant was performed for planning purposes. Once the patient is prepped and draped in the usual sterile fashion, the skin and subcutaneous tissues overlying the gallbladder were generously infiltrated 1% lidocaine for local anesthesia. A coaxial needle was advanced under ultrasound guidance through the skin subcutaneous tissues and a small segment of liver into the gallbladder lumen. With removal of the stylet, spontaneous dark bile drainage occurred. Using modified Seldinger technique, a 10 French drain was placed into the gallbladder fossa, with  aspiration of the sample for the lab. Contrast injection confirmed position of the tube within the gallbladder lumen. Drainage catheter was attached to gravity drain with a suture retention placed. Patient tolerated the procedure well and remained hemodynamically stable throughout. No complications were encountered and no significant blood loss encountered. IMPRESSION: Status post percutaneous cholecystostomy. Signed, Dulcy Fanny. Dellia Nims, RPVI [facility] Radiology Electronically Signed   By: Corrie Mckusick D.O.   On: 05/02/2020 11:20   IR US Guidance  Result Date: 05/02/2020 INDICATION: 87 year old male referred for percutaneous cholecystostomy EXAM: CHOLECYSTOSTOMY; IR ULTRASOUND GUIDANCE MEDICATIONS: None ANESTHESIA/SEDATION: Moderate (conscious) sedation was employed during this procedure. A total of Versed 1.0 mg and Fentanyl 25 mcg was administered intravenously. Moderate Sedation Time: 12 minutes. The patient's level of consciousness and vital signs were monitored continuously by radiology nursing throughout the procedure under my direct supervision. FLUOROSCOPY TIME:  Fluoroscopy Time: 0 minutes 6 seconds (7 mGy). COMPLICATIONS: None PROCEDURE: Informed written consent was obtained from the patient and the patient's family after a thorough discussion of the procedural risks, benefits and alternatives. All questions were addressed. Maximal Sterile Barrier Technique was utilized including caps, mask, sterile gowns, sterile gloves, sterile drape, hand hygiene and skin antiseptic. A timeout was performed prior to the initiation of the procedure. Ultrasound survey of the right upper quadrant was performed for planning purposes. Once the patient is prepped and draped in the usual sterile fashion, the skin and subcutaneous tissues overlying the gallbladder were generously infiltrated 1% lidocaine for local anesthesia. A coaxial needle was advanced under ultrasound  guidance through the skin subcutaneous tissues and a small segment of liver into the gallbladder lumen. With removal of the stylet, spontaneous dark bile drainage occurred. Using modified Seldinger technique, a 10 French drain was placed into the gallbladder fossa, with aspiration of the sample for the lab. Contrast injection confirmed position of the tube within the gallbladder lumen. Drainage catheter was attached to gravity drain with a suture retention placed. Patient tolerated the procedure well and remained hemodynamically stable throughout. No complications were encountered and no significant blood loss encountered. IMPRESSION: Status post percutaneous cholecystostomy. Signed, Dulcy Fanny. Dellia Nims, RPVI [facility] Radiology Electronically Signed   By: Corrie Mckusick D.O.   On: 05/02/2020 11:20   ECHOCARDIOGRAM COMPLETE  Result Date: 05/02/2020    ECHOCARDIOGRAM REPORT   Patient Name:   Nathan [name] Date of Exam: 05/02/2020 Medical Rec #:  [id number]    Height:       72.0 in Accession #:    [id number]   Weight:       198.0 lb Date of Birth:  1933/06/18    BSA:          2.121 m Patient Age:    57 years     BP:  100/61 mmHg Patient Gender: M            HR:           79 bpm. Exam Location:  Inpatient Procedure: 2D Echo Indications:    NSTEMI  History:        Patient has prior history of Echocardiogram examinations, most                 recent 04/26/2018. CAD, chronic kidney disease; Risk                 Factors:Dyslipidemia.  Sonographer:    Johny Chess Referring Phys: [id number] Cut Off A CHANDRASEKHAR IMPRESSIONS  1. Left ventricular ejection fraction, by estimation, is 40 to 45%. The left ventricle has mildly decreased function. The left ventricle demonstrates regional wall motion abnormalities (see scoring diagram/findings for description). Left ventricular diastolic parameters are indeterminate.  2. Right ventricular systolic function is mildly  reduced. The right ventricular size is mildly enlarged. There is mildly elevated pulmonary artery systolic pressure. The IVC is not well seen, but using an estimated RA pressure of 8, the estimated right ventricular systolic pressure is 25.0 mmHg.  3. Left atrial size was moderately dilated.  4. Mitral regurgitation is anteriorly directed and eccentric, and may be underestimated. There is a small flail segment best seen in parasternal long axis views. . The mitral valve is degenerative. Mild to moderate mitral valve regurgitation. No evidence of mitral stenosis.  5. Tricuspid valve regurgitation is severe.  6. The aortic valve is abnormal. There is moderate calcification of the aortic valve. Aortic valve regurgitation is mild to moderate. No aortic stenosis is present. FINDINGS  Left Ventricle: Left ventricular ejection fraction, by estimation, is 40 to 45%. The left ventricle has mildly decreased function. The left ventricle demonstrates regional wall motion abnormalities. The left ventricular internal cavity size was normal in size. There is no left ventricular hypertrophy. Left ventricular diastolic parameters are indeterminate.  LV Wall Scoring: The mid and distal lateral wall and mid anterolateral segment are hypokinetic. Right Ventricle: The right ventricular size is mildly enlarged. Right vetricular wall thickness was not well visualized. Right ventricular systolic function is mildly reduced. There is mildly elevated pulmonary artery systolic pressure. The tricuspid regurgitant velocity is 2.85 m/s, and with an assumed right atrial pressure of 8 mmHg, the estimated right ventricular systolic pressure is 53.9 mmHg. Left Atrium: Left atrial size was moderately dilated. Right Atrium: Right atrial size was normal in size. Pericardium: Trivial pericardial effusion is present. Mitral Valve: Mitral regurgitation is anteriorly directed and eccentric, and may be underestimated. There is a small flail segment best seen  in parasternal long axis views. The mitral valve is degenerative in appearance. Mild to moderate mitral valve regurgitation. No evidence of mitral valve stenosis. Tricuspid Valve: The tricuspid valve is normal in structure. Tricuspid valve regurgitation is severe. Aortic Valve: Vena contracta 0.4 cm. The aortic valve is abnormal. There is moderate calcification of the aortic valve. Aortic valve regurgitation is mild to moderate. Aortic regurgitation PHT measures 231 msec. No aortic stenosis is present. Pulmonic Valve: The pulmonic valve was grossly normal. Pulmonic valve regurgitation is trivial. Aorta: The aortic root is normal in size and structure. Venous: The inferior vena cava was not well visualized. IAS/Shunts: The interatrial septum was not well visualized.  LEFT VENTRICLE PLAX 2D LVIDd:         5.20 cm     Diastology LVIDs:         4.10 cm  LV e' medial:    8.59 cm/s LV PW:         1.20 cm     LV E/e' medial:  8.6 LV IVS:        1.00 cm     LV e' lateral:   8.27 cm/s LVOT diam:     2.30 cm     LV E/e' lateral: 8.9 LV SV:         76 LV SV Index:   36 LVOT Area:     4.15 cm  LV Volumes (MOD) LV vol d, MOD A4C: 74.2 ml LV vol s, MOD A4C: 44.4 ml LV SV MOD A4C:     74.2 ml RIGHT VENTRICLE RV S prime:     10.80 cm/s TAPSE (M-mode): 1.3 cm LEFT ATRIUM             Index       RIGHT ATRIUM           Index LA diam:        4.60 cm 2.17 cm/m  RA Area:     16.50 cm LA Vol (A2C):   98.7 ml 46.52 ml/m RA Volume:   44.50 ml  20.98 ml/m LA Vol (A4C):   73.4 ml 34.60 ml/m LA Biplane Vol: 88.8 ml 41.86 ml/m  AORTIC VALVE LVOT Vmax:   76.70 cm/s LVOT Vmean:  49.300 cm/s LVOT VTI:    0.183 m AI PHT:      231 msec  AORTA Ao Root diam: 3.50 cm MITRAL VALVE               TRICUSPID VALVE MV Area (PHT): 5.13 cm    TR Peak grad:   32.5 mmHg MV Decel Time: 148 msec    TR Vmax:        285.00 cm/s MV E velocity: 73.80 cm/s MV A velocity: 58.00 cm/s  SHUNTS MV E/A ratio:  1.27        Systemic VTI:  0.18 m                             Systemic Diam: 2.30 cm Cherlynn Kaiser MD Electronically signed by Cherlynn Kaiser MD Signature Date/Time: 05/02/2020/4:19:00 PM    Final      Scheduled Meds: . aspirin EC  81 mg Oral Daily  . insulin aspart  0-6 Units Subcutaneous Q4H  . sodium chloride flush  5 mL Intracatheter Q8H   Continuous Infusions: . sodium chloride Stopped (05/03/20 0328)  . heparin 1,250 Units/hr (05/03/20 0330)  . lactated ringers 75 mL/hr at 05/03/20 0330  . piperacillin-tazobactam (ZOSYN)  IV 2.25 g (05/03/20 0650)     LOS: 2 days   Time spent: [place], MD [facility] To contact the attending provider between 7A-7P or the covering provider during after hours 7P-7A, please log into the web site [email or web address] and access using universal [place] password for that web site. If you do not have the password, please call the hospital operator.  05/03/2020, 7:21 AM

## 2020-05-04 DIAGNOSIS — R109 Unspecified abdominal pain: Secondary | ICD-10-CM

## 2020-05-04 DIAGNOSIS — R7989 Other specified abnormal findings of blood chemistry: Secondary | ICD-10-CM

## 2020-05-04 DIAGNOSIS — K8021 Calculus of gallbladder without cholecystitis with obstruction: Secondary | ICD-10-CM | POA: Diagnosis not present

## 2020-05-04 DIAGNOSIS — N184 Chronic kidney disease, stage 4 (severe): Secondary | ICD-10-CM | POA: Diagnosis not present

## 2020-05-04 LAB — GLUCOSE, CAPILLARY
Glucose-Capillary: 219 mg/dL — ABNORMAL HIGH (ref 70–99)
Glucose-Capillary: 217 mg/dL — ABNORMAL HIGH (ref 70–99)
Glucose-Capillary: 168 mg/dL — ABNORMAL HIGH (ref 70–99)
Glucose-Capillary: 199 mg/dL — ABNORMAL HIGH (ref 70–99)
Glucose-Capillary: 196 mg/dL — ABNORMAL HIGH (ref 70–99)

## 2020-05-04 LAB — COMPREHENSIVE METABOLIC PANEL
ALT: 256 U/L — ABNORMAL HIGH (ref 0–44)
AST: 310 U/L — ABNORMAL HIGH (ref 15–41)
Albumin: 2.1 g/dL — ABNORMAL LOW (ref 3.5–5.0)
Alkaline Phosphatase: 30 U/L — ABNORMAL LOW (ref 38–126)
Anion gap: 10 (ref 5–15)
BUN: 70 mg/dL — ABNORMAL HIGH (ref 8–23)
CO2: 23 mmol/L (ref 22–32)
Calcium: 7 mg/dL — ABNORMAL LOW (ref 8.9–10.3)
Chloride: 103 mmol/L (ref 98–111)
Creatinine, Ser: 3.67 mg/dL — ABNORMAL HIGH (ref 0.61–1.24)
GFR, Estimated: 16 mL/min — ABNORMAL LOW (ref >60)
Glucose, Bld: 250 mg/dL — ABNORMAL HIGH (ref 70–99)
Potassium: 3.3 mmol/L — ABNORMAL LOW (ref 3.5–5.1)
Sodium: 136 mmol/L (ref 135–145)
Total Bilirubin: 2.1 mg/dL — ABNORMAL HIGH (ref 0.3–1.2)
Total Protein: 5.3 g/dL — ABNORMAL LOW (ref 6.5–8.1)

## 2020-05-04 LAB — CBC
HCT: 31 % — ABNORMAL LOW (ref 39.0–52.0)
Hemoglobin: 10.8 g/dL — ABNORMAL LOW (ref 13.0–17.0)
MCH: 30.6 pg (ref 26.0–34.0)
MCHC: 34.8 g/dL (ref 30.0–36.0)
MCV: 87.8 fL (ref 80.0–100.0)
Platelets: 130 10*3/uL — ABNORMAL LOW (ref 150–400)
RBC: 3.53 MIL/uL — ABNORMAL LOW (ref 4.22–5.81)
RDW: 14.4 % (ref 11.5–15.5)
WBC: 16.1 10*3/uL — ABNORMAL HIGH (ref 4.0–10.5)
nRBC: 0 % (ref 0.0–0.2)

## 2020-05-04 LAB — MAGNESIUM: Magnesium: 1.8 mg/dL (ref 1.7–2.4)

## 2020-05-04 LAB — APTT: aPTT: 59 seconds — ABNORMAL HIGH (ref 24–36)

## 2020-05-04 LAB — HEPARIN LEVEL (UNFRACTIONATED)
Heparin Unfractionated: 0.18 IU/mL — ABNORMAL LOW (ref 0.30–0.70)
Heparin Unfractionated: 0.26 IU/mL — ABNORMAL LOW (ref 0.30–0.70)
Heparin Unfractionated: 0.22 IU/mL — ABNORMAL LOW (ref 0.30–0.70)

## 2020-05-04 MED ORDER — POTASSIUM CHLORIDE CRYS ER 20 MEQ PO TBCR
40.0000 meq | EXTENDED_RELEASE_TABLET | Freq: Every day | ORAL | Status: DC
  Administered 2020-05-04 – 2020-05-07 (×4): 40 meq via ORAL
  Filled 2020-05-04 (×4): qty 2

## 2020-05-04 MED ORDER — FUROSEMIDE 40 MG PO TABS
40.0000 mg | ORAL_TABLET | Freq: Every day | ORAL | Status: DC
  Administered 2020-05-04 – 2020-05-08 (×5): 40 mg via ORAL
  Filled 2020-05-04 (×5): qty 1

## 2020-05-04 MED ORDER — METOPROLOL SUCCINATE ER 25 MG PO TB24
12.5000 mg | ORAL_TABLET | Freq: Every day | ORAL | Status: DC
  Administered 2020-05-04 – 2020-05-10 (×7): 12.5 mg via ORAL
  Filled 2020-05-04 (×7): qty 1

## 2020-05-04 MED ORDER — DEXTROSE 5 % IV SOLN: INTRAVENOUS | Status: DC

## 2020-05-04 MED ORDER — HYDROCODONE-ACETAMINOPHEN 5-325 MG PO TABS: 1.0000 | ORAL_TABLET | ORAL | Status: DC | PRN

## 2020-05-04 NOTE — Progress Notes (Signed)
Nathan [name] for heparin Indication: hx DVT  No Known Allergies  Patient Measurements: Height: 6' (182.9 cm) Weight: 89.8 kg (198 lb) IBW/kg (Calculated) : 77.6 Heparin Dosing Weight: 89kg  Vital Signs: Temp: 98.1 F (36.7 C) (03/20 1108) Temp Source: Oral (03/20 1108) BP: 138/72 (03/20 1108) Pulse Rate: 69 (03/20 1108)  Labs: Recent Labs    05/01/20 1604 05/02/20 0112 05/02/20 0112 05/02/20 2028 05/03/20 0107 05/03/20 1325 05/04/20 0137 05/04/20 1022  HGB  --  12.5*   < >  --  10.9*  --  10.8*  --   HCT  --  37.3*  --   --  32.7*  --  31.0*  --   PLT  --  171  --   --  131*  --  130*  --   APTT  --   --   --  67*  --  59* 59*  --   HEPARINUNFRC  --   --    < > 0.39  --  0.27* 0.18* 0.26*  CREATININE  --  3.13*  --   --  4.11*  --  3.67*  --   TROPONINIHS 5,012*  --   --   --   --   --   --   --    < > = values in this interval not displayed.    Estimated Creatinine Clearance: 16.4 mL/min (A) (by C-G formula based on SCr of 3.67 mg/dL (H)).   Medical History: Past Medical History:  Diagnosis Date  . Acid reflux   . Aortic insufficiency   . Cholecystitis   . Combined systolic and diastolic heart failure ([place])    Echo 04/2018: inf-lat HK, EF 40-45, severe basal septal hypertrophy, mild conc LVH, Gr 1 DD, severe LAE, mild to mod TR, mod AI, asc Aorta 39 mm  . Coronary artery disease 2002   CABG x5  . Diabetes mellitus   . DVT (deep venous thrombosis) ([facility])    previously on coumadin  . Hypercholesteremia   . Hyperlipidemia   . Mitral valve regurgitation   . Skin cancer    tumor removed off of his back     Assessment: 42 yoM with hx DVT (2019) on apixaban at home admitted with epigastric pain now s/p perc chole. Pharmacy asked to begin IV heparin.  Hg low stable, plt wnl. No bleeding or issues with infusion noted.  Goal of Therapy:  Heparin level 0.3-0.7 units/ml aPTT 66-102 seconds Monitor platelets by  anticoagulation protocol: Yes   Plan:  Increase heparin to 1650 units/h  Heparin level in 8 hours Daily heparin level and aPTT with AM labs -  Monitor daily CBC, s/sx bleeding    Thank you for allowing [place] to participate in this patients care. Jens Som, PharmD 05/04/2020 2:39 PM  Please check [email or web address] for unit-specific pharmacy phone numbers.

## 2020-05-04 NOTE — Plan of Care (Signed)
  Problem: Education: Goal: Knowledge of General Education information will improve Description: Including pain rating scale, medication(s)/side effects and non-pharmacologic comfort measures Outcome: Progressing   Problem: Clinical Measurements: Goal: Will remain free from infection Outcome: Progressing   Problem: Activity: Goal: Risk for activity intolerance will decrease Outcome: Progressing   Problem: Nutrition: Goal: Adequate nutrition will be maintained Outcome: Progressing   Problem: Coping: Goal: Level of anxiety will decrease Outcome: Progressing   Problem: Pain Managment: Goal: General experience of comfort will improve Outcome: Progressing   Problem: Skin Integrity: Goal: Risk for impaired skin integrity will decrease Outcome: Progressing

## 2020-05-04 NOTE — Progress Notes (Signed)
PROGRESS NOTE   General Wearing  DZH:[id number] DOB: 1933-03-23 DOA: 05/01/2020 PCP: Leanna Battles, MD  Brief Narrative:  87 white male  CABG X5 2002 nuke stress 2017 and ICM EF 40-45% 04/06/2018 L LE DVT 09/19/2017 Prior cholecystitis (deferred lap chole)-Normal HIDA 2019 CKD 4 baseline HLD DM TY 2  Represent [facility] 3/16 abdominal pain S OB, nausea/vomit Transaminitis 200 range bili 2.0 CT = GB stone RUQ USG = sludge - cholecystitis choledocholithiasis  General surgery, GI, cardiology, critical care all consulted on admission Patient confirmed DNR at bedside  Hospital-Problem based course  Severe gram-negative sepsis (on cholecystotomy culture) on admission with multiorgan dysfunction syndrome secondary to acute cholecystitis Much improved-saline lock IV-diet as tolerated Patient confirmed to be no CODE BLUE Poor lap chole candidate-IR coordinating 8 weekly drain exchanges NSTEMI on admission troponins now 5000-likely demand ischemia Underlying CAD CABG X5 EF 45% this admit mild decrease in function As per cardiology-on heparin/asa 81 Hydralazine changed to IV metoprolol every 2 as needed pressure over 180 Metoprolol 12.5 daily XL DVT 2019 Eliquis on hold-on IV heparin secondary to NSTEMI Would not resume Eliquis for the time being-likely in 1 week post procedure Worsening ATN 2/2 sepsis physiology most likely superimposed on CKD 4 fractional excretion of sodium = 0.9 indicating predominant prerenal component-US kidney no hydro- Foley catheter placed in kidney function improving Stop all fluids today DM TY 2-diet controlled? Strict glycemic control sliding scale ordered-CBGs 1 68-2 20  DVT prophylaxis: IV heparin therapeutic dosing Code Status: DNR confirmed Family Communication: Long discussion at the bedside with daughter --He has improved We are stopping fluids we will give a little bit of Lasix  Disposition:  Status is: Inpatient  Remains inpatient appropriate  because:Ongoing active pain requiring inpatient pain management, Altered mental status, Ongoing diagnostic testing needed not appropriate for outpatient work up and Inpatient level of care appropriate due to severity of illness   Dispo: The patient is from: Home              Anticipated d/c is to: SNF              Patient currently is not medically stable to d/c.   Difficult to place patient No  Consultants:   Multiple as above  Procedures: Cholecystotomy 3/18  Antimicrobials: Vancomycin Zosyn   Subjective: Poor appetite Slight S OB on 4 L of oxygen No chest pain no fever No nausea no vomiting no chest pain  Objective: Vitals:   05/03/20 2304 05/04/20 0315 05/04/20 0735 05/04/20 1108  BP: 125/76 125/65 132/81 138/72  Pulse: 66 61 72 69  Resp: 17 20 19 18   Temp: 98.2 F (36.8 C) 98.6 F (37 C) 98.6 F (37 C) 98.1 F (36.7 C)  TempSrc: Oral Oral Oral Oral  SpO2: 91% 92% 90% 92%  Weight:      Height:        Intake/Output Summary (Last 24 hours) at 05/04/2020 1521 Last data filed at 05/04/2020 0800 Gross per 24 hour  Intake 278.42 ml  Output 850 ml  Net -571.58 ml   Filed Weights   05/01/20 0228  Weight: 89.8 kg    Examination:  EOMI NCAT no focal deficit mucosa moist S1-S2 no murmur-mild JVD Slight tachycardia with PVCs Abdomen soft PERC drain noted Foley in place No lower extremity edema or plethora to lower extremities upper extremities   Data Reviewed: personally reviewed   CBC    Component Value Date/Time   WBC 16.1 (H) 05/04/2020 4196  RBC 3.53 (L) 05/04/2020 0137   HGB 10.8 (L) 05/04/2020 0137   HGB 13.6 04/04/2018 1203   HCT 31.0 (L) 05/04/2020 0137   HCT 41.1 04/04/2018 1203   PLT 130 (L) 05/04/2020 0137   PLT 189 04/04/2018 1203   MCV 87.8 05/04/2020 0137   MCV 91 04/04/2018 1203   MCH 30.6 05/04/2020 0137   MCHC 34.8 05/04/2020 0137   RDW 14.4 05/04/2020 0137   RDW 13.3 04/04/2018 1203   LYMPHSABS 4.1 (H) 02/19/2015 0305    MONOABS 1.7 (H) 02/19/2015 0305   EOSABS 0.2 02/19/2015 0305   BASOSABS 0.1 02/19/2015 0305   CMP Latest Ref Rng & Units 05/04/2020 05/03/2020 05/02/2020  Glucose 70 - 99 mg/dL 250(H) 170(H) 154(H)  BUN 8 - 23 mg/dL 70(H) 65(H) 45(H)  Creatinine 0.61 - 1.24 mg/dL 3.67(H) 4.11(H) 3.13(H)  Sodium 135 - 145 mmol/L 136 140 141  Potassium 3.5 - 5.1 mmol/L 3.3(L) 3.7 3.7  Chloride 98 - 111 mmol/L 103 107 108  CO2 22 - 32 mmol/L 23 19(L) 18(L)  Calcium 8.9 - 10.3 mg/dL 7.0(L) 7.5(L) 8.3(L)  Total Protein 6.5 - 8.1 g/dL 5.3(L) 5.6(L) 6.1(L)  Total Bilirubin 0.3 - 1.2 mg/dL 2.1(H) 3.2(H) 6.0(H)  Alkaline Phos 38 - 126 U/L 30(L) 15(L) 129(H)  AST 15 - 41 U/L 310(H) 617(H) 364(H)  ALT 0 - 44 U/L 256(H) 321(H) 191(H)     Radiology Studies: US RENAL  Result Date: 05/03/2020 CLINICAL DATA:  87 year old male with acute tubular necrosis. EXAM: RENAL / URINARY TRACT ULTRASOUND COMPLETE COMPARISON:  Noncontrast CT Abdomen and Pelvis 05/01/2020. FINDINGS: Right Kidney: Renal measurements: 9.5 x 5.3 x 5.1 cm = volume: 137 mL. Renal cortical thinning (image 4) but preserved cortical echogenicity. No right hydronephrosis or right renal mass. Left Kidney: Renal measurements: 10.6 x 6.0 x 5.6 cm = volume: 185 mL. Better preserved left renal cortex (image 24). Normal cortical echogenicity. No left hydronephrosis or renal mass. Bladder: Decompressed by Foley catheter. Other: None. IMPRESSION: No acute renal finding. Right greater than left chronic renal cortical volume loss. Electronically Signed   By: Genevie Ann M.D.   On: 05/03/2020 10:47     Scheduled Meds: . aspirin EC  81 mg Oral Daily  . Chlorhexidine Gluconate Cloth  6 each Topical Daily  . furosemide  40 mg Oral Daily  . insulin aspart  0-6 Units Subcutaneous Q4H  . metoprolol succinate  12.5 mg Oral Daily  . potassium chloride  40 mEq Oral Daily  . sodium chloride flush  5 mL Intracatheter Q8H   Continuous Infusions: . sodium chloride Stopped  (05/03/20 0328)  . dextrose    . heparin 1,650 Units/hr (05/04/20 1450)  . piperacillin-tazobactam (ZOSYN)  IV 2.25 g (05/04/20 1353)     LOS: 3 days   Time spent: [place], [place] [facility] To contact the attending provider between 7A-7P or the covering provider during after hours 7P-7A, please log into the web site [email or web address] and access using universal Catherine password for that web site. If you do not have the password, please call the hospital operator.  05/04/2020, 3:21 PM

## 2020-05-04 NOTE — Progress Notes (Signed)
Referring Physician(s): Erskin Burnet, PA-C  Supervising Physician: Arne Cleveland  Patient Status:  [name] - In-pt  Chief Complaint:  F/u Per chole  Brief History:  Nathan [name] is an 87 y.o. male whopresented to the [facility] ED 05/01/20 with complaints of epigastric pain and shortness of breath.   He has a history of having a cholecystostomy in 2016 and was supposed to get his gallbladder removed but never did.  Lab work was significant for elevated WBC, LFTs and bilirubin.  US showed Gallbladder sludge with no findings of acute cholecystitis or Choledocholithiasis.  A HIDA was also done and showed= Diffuse and persistent radiotracer activity identified throughout the liver with no signs of biliary activity after 1 hour of imaging. Differential consideration of these findings include heptocellular dysfunction secondary to hepatitis versus high-grade bile duct obstruction. No gallbladder activity visualized."  The patient continued to decline with signs of impending sepsis.   Interventional Radiology was asked to evaluate this patient for an urgent placement of a percutaneous cholecystostomy.   He underwent perc chole on 3/18/22by Dr. Earleen Newport.  Subjective:  Lying in bed, no complaints. Family member (asssumed daughter) at bedside.  Allergies: Patient has no known allergies.  Medications: Prior to Admission medications   Medication Sig Start Date End Date Taking? Authorizing Provider  albuterol (VENTOLIN HFA) 108 (90 Base) MCG/ACT inhaler Inhale 1-2 puffs into the lungs every 6 (six) hours as needed for shortness of breath or wheezing. 11/13/19  Yes [provider]  apixaban (ELIQUIS) 2.5 MG TABS tablet Take 2.5 mg by mouth 2 (two) times daily.   Yes [provider]  atorvastatin (LIPITOR) 40 MG tablet Take 40 mg by mouth daily. 02/02/15  Yes [provider]  Blood Glucose Monitoring Suppl (ONETOUCH VERIO) w/Device KIT  09/06/17  Yes [provider]  carvedilol (COREG) 6.25 MG tablet TAKE 1 TABLET BY MOUTH EVERY DAY Patient taking differently: Take 6.25 mg by mouth daily. 04/01/20  Yes Nahser, Wonda Cheng, MD  fenofibrate (TRICOR) 145 MG tablet Take 1 tablet (145 mg total) by mouth daily. 03/21/20  Yes Nahser, Wonda Cheng, MD  guaifenesin (ROBITUSSIN) 100 MG/5ML syrup Take 200 mg by mouth 3 (three) times daily as needed for cough or congestion.   Yes [provider]  hydrALAZINE (APRESOLINE) 25 MG tablet Take 1 tablet (25 mg total) by mouth 2 (two) times daily. 03/21/20  Yes Nahser, Wonda Cheng, MD  insulin NPH-regular Human (NOVOLIN 70/30) (70-30) 100 UNIT/ML injection Inject 20-35 Units into the skin See admin instructions. 35 units in the morning, 20 units at dinner   Yes [provider]  isosorbide mononitrate (IMDUR) 30 MG 24 hr tablet Take 1 tablet (30 mg total) by mouth daily. 07/10/19  Yes Nahser, Wonda Cheng, MD  Lancets Glory Rosebush ULTRASOFT) lancets  09/06/17  Yes [provider]  Multiple Vitamins-Minerals (CENTRUM SILVER PO) Take 1 tablet by mouth daily.   Yes [provider]  nitroGLYCERIN (NITROSTAT) 0.4 MG SL tablet Place 1 tablet (0.4 mg total) under the tongue every 5 (five) minutes as needed for chest pain. 04/04/18  Yes Weaver, Scott T, PA-C  Omega-3 Fatty Acids (FISH OIL) 1200 MG CAPS Take 1,200 mg by mouth 2 (two) times daily.   Yes [provider]  omeprazole (PRILOSEC) 20 MG capsule Take 20 mg by mouth 2 (two) times daily.  01/06/11  Yes [provider]  ONETOUCH VERIO test strip  09/06/17  Yes [provider]  Carpio  1ML/31G 31G X 5/16" 1 ML MISC USE 1 TWICE DAILY AS DIRECTED 09/29/17  Yes [provider]  tamsulosin (FLOMAX) 0.4 MG CAPS capsule Take 0.4 mg by mouth at bedtime. 03/11/20  Yes [provider]  TOLAK 4 % CREA Apply to areas qhs x 3weeks 01/25/20  Yes [provider]     Vital Signs: BP 138/72 (BP Location:  Left Arm)   Pulse 69   Temp 98.1 F (36.7 C) (Oral)   Resp 18   Ht 6' (1.829 m)   Wt 89.8 kg   SpO2 92%   BMI 26.85 kg/m   Physical Exam Vitals reviewed.  Constitutional:      Appearance: Normal appearance.  Cardiovascular:     Rate and Rhythm: Normal rate.  Pulmonary:     Effort: Pulmonary effort is normal. No respiratory distress.  Abdominal:     Palpations: Abdomen is soft.     Comments: Perc chole in place. Brown drainage in bag. ~300 mL output recorded.  Neurological:     Mental Status: He is alert.     Imaging: CT ABDOMEN PELVIS WO CONTRAST  Result Date: 05/01/2020 CLINICAL DATA:  Acute nonlocalized abdominal pain. Epigastric pain and shortness of breath that began tonight EXAM: CT ABDOMEN AND PELVIS WITHOUT CONTRAST TECHNIQUE: Multidetector CT imaging of the abdomen and pelvis was performed following the standard protocol without IV contrast. COMPARISON:  10/26/2017 FINDINGS: Lower chest: Coronary atherosclerosis. Prior CABG. Fatty hiatal hernia. Calcified right hilar lymph nodes. Hepatobiliary: No focal liver abnormality.Cholelithiasis. No evidence of acute cholecystitis. Pancreas: Unremarkable. Spleen: Unremarkable. Adrenals/Urinary Tract: Negative adrenals. Symmetric renal atrophy. Renal sinus cysts on both sides. No hydronephrosis. Moderate distension of the urinary bladder with generalized wall thickening. Stomach/Bowel: No obstruction. No visible bowel inflammation. Mild distal colonic diverticulosis. Vascular/Lymphatic: No acute vascular abnormality. Atheromatous calcification of the aorta and branch vessels. No mass or adenopathy. Reproductive:Symmetric enlargement of the prostate which uplifts the bladder base. Other: No ascites or pneumoperitoneum. Shallow fatty right inguinal hernia suggested on coronal reformats. There could be some fat herniation along the upper right femoral ring. Musculoskeletal: No acute abnormalities. L5 chronic pars defects with L5-S1 advanced  disc degeneration, anterolisthesis, and severe biforaminal impingement. Spinal degeneration is generalized in the lower thoracic and lumbar spine. IMPRESSION: 1. No acute finding or specific cause of symptoms. 2. Cholelithiasis. 3. Enlarged prostate with findings of chronic outlet obstruction affecting the bladder. 4.  Aortic Atherosclerosis (ICD10-I70.0). Electronically Signed   By: Monte Fantasia M.D.   On: 05/01/2020 05:30   DG Chest 2 View  Result Date: 05/01/2020 CLINICAL DATA:  Epigastric pain, short of breath EXAM: CHEST - 2 VIEW COMPARISON:  02/18/2015, 11/04/2017 FINDINGS: Frontal and lateral views of the chest demonstrates stable postsurgical changes from median sternotomy. The cardiac silhouette is unremarkable. Lung volumes are diminished, with bibasilar consolidation, left greater than right, likely representing atelectasis. No effusion or pneumothorax. No acute bony abnormalities. IMPRESSION: 1. Low lung volumes, with bibasilar consolidation likely reflecting atelectasis. Electronically Signed   By: Randa Ngo M.D.   On: 05/01/2020 02:16   US RENAL  Result Date: 05/03/2020 CLINICAL DATA:  87 year old male with acute tubular necrosis. EXAM: RENAL / URINARY TRACT ULTRASOUND COMPLETE COMPARISON:  Noncontrast CT Abdomen and Pelvis 05/01/2020. FINDINGS: Right Kidney: Renal measurements: 9.5 x 5.3 x 5.1 cm = volume: 137 mL. Renal cortical thinning (image 4) but preserved cortical echogenicity. No right hydronephrosis or right renal mass. Left Kidney: Renal measurements: 10.6 x 6.0 x 5.6 cm =  volume: 185 mL. Better preserved left renal cortex (image 24). Normal cortical echogenicity. No left hydronephrosis or renal mass. Bladder: Decompressed by Foley catheter. Other: None. IMPRESSION: No acute renal finding. Right greater than left chronic renal cortical volume loss. Electronically Signed   By: Genevie Ann M.D.   On: 05/03/2020 10:47   NM Hepato W/EF  Result Date: 05/01/2020 CLINICAL DATA:   Acute abdominal pain. Elevated LFTs and bilirubin level. EXAM: NUCLEAR MEDICINE HEPATOBILIARY IMAGING TECHNIQUE: Sequential images of the abdomen were obtained out to 60 minutes following intravenous administration of radiopharmaceutical. RADIOPHARMACEUTICALS:  7.6 mCi Tc-25m Choletec IV COMPARISON:  None. FINDINGS: Prompt uptake of activity by the liver is seen. After 1 hour of imaging there is persistent diffuse increased tracer uptake throughout the liver with no biliary activity identified. No gallbladder activity is visualized. IMPRESSION: Diffuse and persistent radiotracer activity identified throughout the liver with no signs of biliary activity after 1 hour of imaging. Differential consideration of these findings include hepatocellular dysfunction secondary to hepatitis versus high-grade bile duct obstruction. No gallbladder activity visualized. Electronically Signed   By: TKerby MoorsM.D.   On: 05/01/2020 13:17   IR Perc Cholecystostomy  Result Date: 05/02/2020 INDICATION: 87year old male referred for percutaneous cholecystostomy EXAM: CHOLECYSTOSTOMY; IR ULTRASOUND GUIDANCE MEDICATIONS: None ANESTHESIA/SEDATION: Moderate (conscious) sedation was employed during this procedure. A total of Versed 1.0 mg and Fentanyl 25 mcg was administered intravenously. Moderate Sedation Time: 12 minutes. The patient's level of consciousness and vital signs were monitored continuously by radiology nursing throughout the procedure under my direct supervision. FLUOROSCOPY TIME:  Fluoroscopy Time: 0 minutes 6 seconds (7 mGy). COMPLICATIONS: None PROCEDURE: Informed written consent was obtained from the patient and the patient's family after a thorough discussion of the procedural risks, benefits and alternatives. All questions were addressed. Maximal Sterile Barrier Technique was utilized including caps, mask, sterile gowns, sterile gloves, sterile drape, hand hygiene and skin antiseptic. A timeout was performed prior  to the initiation of the procedure. Ultrasound survey of the right upper quadrant was performed for planning purposes. Once the patient is prepped and draped in the usual sterile fashion, the skin and subcutaneous tissues overlying the gallbladder were generously infiltrated 1% lidocaine for local anesthesia. A coaxial needle was advanced under ultrasound guidance through the skin subcutaneous tissues and a small segment of liver into the gallbladder lumen. With removal of the stylet, spontaneous dark bile drainage occurred. Using modified Seldinger technique, a 10 French drain was placed into the gallbladder fossa, with aspiration of the sample for the lab. Contrast injection confirmed position of the tube within the gallbladder lumen. Drainage catheter was attached to gravity drain with a suture retention placed. Patient tolerated the procedure well and remained hemodynamically stable throughout. No complications were encountered and no significant blood loss encountered. IMPRESSION: Status post percutaneous cholecystostomy. Signed, JDulcy Fanny WDellia Nims RPVI [facility] Electronically Signed   By: JCorrie MckusickD.O.   On: 05/02/2020 11:20   IR [place]  Result Date: 05/02/2020 INDICATION: 87year old male referred for percutaneous cholecystostomy EXAM: CHOLECYSTOSTOMY; IR ULTRASOUND GUIDANCE MEDICATIONS: None ANESTHESIA/SEDATION: Moderate (conscious) sedation was employed during this procedure. A total of Versed 1.0 mg and Fentanyl 25 mcg was administered intravenously. Moderate Sedation Time: 12 minutes. The patient's level of consciousness and vital signs were monitored continuously by radiology nursing throughout the procedure under my direct supervision. FLUOROSCOPY TIME:  Fluoroscopy Time: 0 minutes 6 seconds (7 mGy). COMPLICATIONS: None PROCEDURE: Informed written consent  was obtained from the patient and the patient's family after a thorough  discussion of the procedural risks, benefits and alternatives. All questions were addressed. Maximal Sterile Barrier Technique was utilized including caps, mask, sterile gowns, sterile gloves, sterile drape, hand hygiene and skin antiseptic. A timeout was performed prior to the initiation of the procedure. Ultrasound survey of the right upper quadrant was performed for planning purposes. Once the patient is prepped and draped in the usual sterile fashion, the skin and subcutaneous tissues overlying the gallbladder were generously infiltrated 1% lidocaine for local anesthesia. A coaxial needle was advanced under ultrasound guidance through the skin subcutaneous tissues and a small segment of liver into the gallbladder lumen. With removal of the stylet, spontaneous dark bile drainage occurred. Using modified Seldinger technique, a 10 French drain was placed into the gallbladder fossa, with aspiration of the sample for the lab. Contrast injection confirmed position of the tube within the gallbladder lumen. Drainage catheter was attached to gravity drain with a suture retention placed. Patient tolerated the procedure well and remained hemodynamically stable throughout. No complications were encountered and no significant blood loss encountered. IMPRESSION: Status post percutaneous cholecystostomy. Signed, Dulcy Fanny. Dellia Nims, RPVI [facility] Electronically Signed   By: Corrie Mckusick D.O.   On: 05/02/2020 11:20   ECHOCARDIOGRAM COMPLETE  Result Date: 05/02/2020    ECHOCARDIOGRAM REPORT   Patient Name:   Nathan [name] Date of Exam: 05/02/2020 Medical Rec #:  [id number]    Height:       72.0 in Accession #:    [id number]   Weight:       198.0 lb Date of Birth:  08/19/1933    BSA:          2.121 m Patient Age:    81 years     BP:           100/61 mmHg Patient Gender: M            HR:           79 bpm. Exam Location:  Inpatient Procedure: 2D Echo Indications:     NSTEMI  History:        Patient has prior history of Echocardiogram examinations, most                 recent 04/26/2018. CAD, chronic kidney disease; Risk                 Factors:Dyslipidemia.  Sonographer:    Johny Chess Referring Phys: [id number] [place] A CHANDRASEKHAR IMPRESSIONS  1. Left ventricular ejection fraction, by estimation, is 40 to 45%. The left ventricle has mildly decreased function. The left ventricle demonstrates regional wall motion abnormalities (see scoring diagram/findings for description). Left ventricular diastolic parameters are indeterminate.  2. Right ventricular systolic function is mildly reduced. The right ventricular size is mildly enlarged. There is mildly elevated pulmonary artery systolic pressure. The IVC is not well seen, but using an estimated RA pressure of 8, the estimated right ventricular systolic pressure is 11.9 mmHg.  3. Left atrial size was moderately dilated.  4. Mitral regurgitation is anteriorly directed and eccentric, and may be underestimated. There is a small flail segment best seen in parasternal long axis views. . The mitral valve is degenerative. Mild to moderate mitral valve regurgitation. No evidence of mitral stenosis.  5. Tricuspid valve regurgitation is severe.  6. The aortic valve is abnormal. There is moderate calcification of the aortic valve. Aortic valve  regurgitation is mild to moderate. No aortic stenosis is present. FINDINGS  Left Ventricle: Left ventricular ejection fraction, by estimation, is 40 to 45%. The left ventricle has mildly decreased function. The left ventricle demonstrates regional wall motion abnormalities. The left ventricular internal cavity size was normal in size. There is no left ventricular hypertrophy. Left ventricular diastolic parameters are indeterminate.  LV Wall Scoring: The mid and distal lateral wall and mid anterolateral segment are hypokinetic. Right Ventricle: The right ventricular size is mildly enlarged. Right  vetricular wall thickness was not well visualized. Right ventricular systolic function is mildly reduced. There is mildly elevated pulmonary artery systolic pressure. The tricuspid regurgitant velocity is 2.85 m/s, and with an assumed right atrial pressure of 8 mmHg, the estimated right ventricular systolic pressure is 88.4 mmHg. Left Atrium: Left atrial size was moderately dilated. Right Atrium: Right atrial size was normal in size. Pericardium: Trivial pericardial effusion is present. Mitral Valve: Mitral regurgitation is anteriorly directed and eccentric, and may be underestimated. There is a small flail segment best seen in parasternal long axis views. The mitral valve is degenerative in appearance. Mild to moderate mitral valve regurgitation. No evidence of mitral valve stenosis. Tricuspid Valve: The tricuspid valve is normal in structure. Tricuspid valve regurgitation is severe. Aortic Valve: Vena contracta 0.4 cm. The aortic valve is abnormal. There is moderate calcification of the aortic valve. Aortic valve regurgitation is mild to moderate. Aortic regurgitation PHT measures 231 msec. No aortic stenosis is present. Pulmonic Valve: The pulmonic valve was grossly normal. Pulmonic valve regurgitation is trivial. Aorta: The aortic root is normal in size and structure. Venous: The inferior vena cava was not well visualized. IAS/Shunts: The interatrial septum was not well visualized.  LEFT VENTRICLE PLAX 2D LVIDd:         5.20 cm     Diastology LVIDs:         4.10 cm     LV e' medial:    8.59 cm/s LV PW:         1.20 cm     LV E/e' medial:  8.6 LV IVS:        1.00 cm     LV e' lateral:   8.27 cm/s LVOT diam:     2.30 cm     LV E/e' lateral: 8.9 LV SV:         76 LV SV Index:   36 LVOT Area:     4.15 cm  LV Volumes (MOD) LV vol d, MOD A4C: 74.2 ml LV vol s, MOD A4C: 44.4 ml LV SV MOD A4C:     74.2 ml RIGHT VENTRICLE RV S prime:     10.80 cm/s TAPSE (M-mode): 1.3 cm LEFT ATRIUM             Index       RIGHT ATRIUM            Index LA diam:        4.60 cm 2.17 cm/m  RA Area:     16.50 cm LA Vol (A2C):   98.7 ml 46.52 ml/m RA Volume:   44.50 ml  20.98 ml/m LA Vol (A4C):   73.4 ml 34.60 ml/m LA Biplane Vol: 88.8 ml 41.86 ml/m  AORTIC VALVE LVOT Vmax:   76.70 cm/s LVOT Vmean:  49.300 cm/s LVOT VTI:    0.183 m AI PHT:      231 msec  AORTA Ao Root diam: 3.50 cm MITRAL VALVE  TRICUSPID VALVE MV Area (PHT): 5.13 cm    TR Peak grad:   32.5 mmHg MV Decel Time: 148 msec    TR Vmax:        285.00 cm/s MV E velocity: 73.80 cm/s MV A velocity: 58.00 cm/s  SHUNTS MV E/A ratio:  1.27        Systemic VTI:  0.18 m                            Systemic Diam: 2.30 cm Cherlynn Kaiser MD Electronically signed by Cherlynn Kaiser MD Signature Date/Time: 05/02/2020/4:19:00 PM    Final    US Abdomen Limited RUQ (LIVER/GB)  Result Date: 05/01/2020 CLINICAL DATA:  Right upper quadrant pain. EXAM: ULTRASOUND ABDOMEN LIMITED RIGHT UPPER QUADRANT COMPARISON:  Ultrasound renal 03/18/2020, CT abdomen pelvis 10/26/2017, ultrasound abdomen 09/28/2017 FINDINGS: Gallbladder: Gallbladder sludge. No gallstones. No gallbladder wall thickening or pericholecystic fluid. No sonographic Murphy sign noted by sonographer. Common bile duct: Diameter: 5 mm. Liver: Limited evaluation with no definite focal lesion identified. Slightly increased parenchymal echogenicity. Portal vein is patent on color Doppler imaging with normal direction of blood flow towards the liver. Other: None. IMPRESSION: 1. Gallbladder sludge with no findings of acute cholecystitis or choledocholithiasis. 2. Hepatic steatosis. Please note limited evaluation for focal hepatic masses in a patient with hepatic steatosis due to decreased penetration of the acoustic ultrasound waves. Electronically Signed   By: Iven Finn M.D.   On: 05/01/2020 04:24    Labs:  CBC: Recent Labs    05/01/20 0156 05/02/20 0112 05/03/20 0107 05/04/20 0137  WBC 10.8* 6.3 26.0* 16.1*  HGB  12.9* 12.5* 10.9* 10.8*  HCT 38.1* 37.3* 32.7* 31.0*  PLT 250 171 131* 130*    COAGS: Recent Labs    05/02/20 2028 05/03/20 1325 05/04/20 0137  APTT 67* 59* 59*    BMP: Recent Labs    06/26/19 0811 07/03/19 0821 05/01/20 0156 05/02/20 0112 05/03/20 0107 05/04/20 0137  NA 142 142 138 141 140 136  K 4.0 4.3 3.6 3.7 3.7 3.3*  CL 105 112* 108 108 107 103  CO2 20 18* 21* 18* 19* 23  GLUCOSE 100* 75 137* 154* 170* 250*  BUN 51* 37* 35* 45* 65* 70*  CALCIUM 9.2 8.6 8.8* 8.3* 7.5* 7.0*  CREATININE 2.93* 2.38* 2.24* 3.13* 4.11* 3.67*  GFRNONAA 19* 24* 28* 19* 14* 16*  GFRAA 22* 28*  --   --   --   --     LIVER FUNCTION TESTS: Recent Labs    05/01/20 0304 05/02/20 0112 05/03/20 0107 05/04/20 0137  BILITOT 2.0* 6.0* 3.2* 2.1*  AST 273* 364* 617* 310*  ALT 112* 191* 321* 256*  ALKPHOS 50 129* 15* 30*  PROT 6.6 6.1* 5.6* 5.3*  ALBUMIN 3.3* 2.7* 2.4* 2.1*    Assessment and Plan:  Cholelithiasis  S/p percutaneous cholecystostomy by Dr. Earleen Newport on 05/02/20.  Continue routine drain care with flushes q shift and empty and record output.  Will need contrast drain injection in 6 weeks to evaluate patency of cystic duct. If patent, drain can be capped at that time.  Per chart, the patient is high risk from a cardiac standpoint for elective cholecystectomy and will likely need to keep this cholecystostomy in place. If this is the case, he will need routine exchanges every 8-10 weeks or so.   Electronically Signed: Murrell Redden, PA-C 05/04/2020, 11:51 AM    I spent  a total of 15 Minutes at the the patient's bedside AND on the patient's hospital floor or unit, greater than 50% of which was counseling/coordinating care for f/u per chole.

## 2020-05-04 NOTE — Evaluation (Signed)
Physical Therapy Evaluation Patient Details Name: Nathan [name] MRN: [id number] DOB: 1933-05-06 Today's Date: 05/04/2020   History of Present Illness  Pt is an 87 y/o male admitted secondary to increased abdominal pain on 3/17. Found to have cholelithiasis and is s/p percutaneous cholecystostomy on 3/18. Pt also found to have NSTEMI. PMH includes DM, HTN, CHF, CKD, and CAD s/p CABG.  Clinical Impression  Pt admitted secondary to problem above with deficits below. Presenting with increased weakness and decreased activity tolerance. Pt requiring min A for bed mobility tasks. Pt reporting dizziness upon sitting which limited mobility; BP at 140/80 mmHg. Pt currently lives alone and reports family is not close by. Feel he would benefit from SNF at this time, however, if he progresses well, may be able to consider HHPT. Will continue to follow acutely.     Follow Up Recommendations SNF    Equipment Recommendations  3in1 (PT)    Recommendations for Other Services       Precautions / Restrictions Precautions Precautions: Fall Precaution Comments: percutaneous drain on R. Restrictions Weight Bearing Restrictions: No      Mobility  Bed Mobility Overal bed mobility: Needs Assistance Bed Mobility: Supine to Sit;Sit to Supine     Supine to sit: Min assist Sit to supine: Min assist   General bed mobility comments: Min A for trunk elevation and LE assist throughout bed mobility. Pt reporting increased dizziness in sitting which limited mobility. Requesting to lie back down. BP at 140/80 upon sitting.    Transfers                    Ambulation/Gait                Stairs            Wheelchair Mobility    Modified Rankin (Stroke Patients Only)       Balance Overall balance assessment: Needs assistance Sitting-balance support: No upper extremity supported;Feet supported Sitting balance-[name]: Fair                                        Pertinent Vitals/Pain Pain Assessment: Faces Faces Pain Scale: Hurts little more Pain Location: abdomen Pain Descriptors / Indicators: Aching;Operative site guarding Pain Intervention(s): Monitored during session;Limited activity within patient's tolerance;Repositioned    Home Living Family/patient expects to be discharged to:: Private residence Living Arrangements: Alone Available Help at Discharge: Family;Available PRN/intermittently Type of Home: Mobile home Home Access: Stairs to enter Entrance Stairs-Rails: None Entrance Stairs-Number of Steps: 2 Home Layout: One level Home Equipment: Walker - 2 wheels      Prior Function Level of Independence: Independent         Comments: Still driving. Independent with mobility and ADLs.     Hand Dominance        Extremity/Trunk Assessment   Upper Extremity Assessment Upper Extremity Assessment: Defer to OT evaluation    Lower Extremity Assessment Lower Extremity Assessment: Generalized weakness    Cervical / Trunk Assessment Cervical / Trunk Assessment: Kyphotic  Communication   Communication: No difficulties  Cognition Arousal/Alertness: Awake/alert Behavior During Therapy: WFL for tasks assessed/performed Overall Cognitive Status: Within Functional Limits for tasks assessed  General Comments      Exercises     Assessment/Plan    PT Assessment Patient needs continued PT services  PT Problem List Decreased strength;Decreased activity tolerance;Decreased balance;Decreased mobility       PT Treatment Interventions DME instruction;Gait training;Balance training;Functional mobility training;Therapeutic activities;Therapeutic exercise;Stair training;Patient/family education    PT Goals (Current goals can be found in the Care Plan section)  Acute Rehab PT Goals Patient Stated Goal: to feel better PT Goal Formulation: With patient Time For Goal  Achievement: 05/18/20 Potential to Achieve Goals: Fair    Frequency Min 2X/week   Barriers to discharge Decreased caregiver support      Co-evaluation               AM-PAC PT "6 Clicks" Mobility  Outcome Measure Help needed turning from your back to your side while in a flat bed without using bedrails?: A Little Help needed moving from lying on your back to sitting on the side of a flat bed without using bedrails?: A Little Help needed moving to and from a bed to a chair (including a wheelchair)?: A Lot Help needed standing up from a chair using your arms (e.g., wheelchair or bedside chair)?: A Lot Help needed to walk in hospital room?: A Lot Help needed climbing 3-5 steps with a railing? : A Lot 6 Click Score: 14    End of Session Equipment Utilized During Treatment: Oxygen Activity Tolerance: Treatment limited secondary to medical complications (Comment) (dizziness) Patient left: in bed;with call bell/phone within reach;with bed alarm set Nurse Communication: Mobility status PT Visit Diagnosis: Muscle weakness (generalized) (M62.81);Difficulty in walking, not elsewhere classified (R26.2)    Time: 2080-2233 PT Time Calculation (min) (ACUTE ONLY): 13 min   Charges:   PT Evaluation $PT Eval Moderate Complexity: 1 Mod          Reuel Derby, PT, DPT  Acute Rehabilitation Services  Pager: [phone] Office: [phone]   MAKSIM PEREGOY 05/04/2020, 9:26 AM

## 2020-05-04 NOTE — Progress Notes (Signed)
     [facility] Progress Note  CC:  Acute calculus cholecystitis  Assessment / Plan:  Acute calculus cholecystitis status post cholecystostomy tube 05/02/20.  On antibiotics.  Feeling better.  Leukocytosis and abnormal liver enzymes are improving.     PLAN: - Continue antibiotics - Continue to monitor drain output - Continue to follow serial liver enzymes, WBC - Surgery following - MRCP should be considered in the future if there are concerns for bile duct obstruction to evaluate for choledocholithiasis.   Subjective: Feeling better today. GI ROS is negative. No family present at the tbedside.   Objective:  Vital signs in last 24 hours: Temp:  [97.5 F (36.4 C)-98.6 F (37 C)] 98.6 F (37 C) (03/20 0315) Pulse Rate:  [61-80] 61 (03/20 0315) Resp:  [17-20] 20 (03/20 0315) BP: (100-144)/(55-88) 125/65 (03/20 0315) SpO2:  [91 %-94 %] 92 % (03/20 0315)   General:   Alert, in NAD, no scleral icterus today Heart:  Regular rate and rhythm; no murmurs Pulm: Clear anteriorly; no wheezing Abdomen:  Soft. Nontender. Nondistended. Drain in the RUQ that is mildly tender with manipulation. Bilious material in the drain. Normal bowel sounds. No rebound or guarding. LAD: No inguinal or umbilical LAD Extremities:  Without edema. Neurologic:  Alert and  oriented x4;  grossly normal neurologically. Psych:  Alert and cooperative. Normal mood and affect.   Lab Results: Recent Labs    05/02/20 0112 05/03/20 0107 05/04/20 0137  WBC 6.3 26.0* 16.1*  HGB 12.5* 10.9* 10.8*  HCT 37.3* 32.7* 31.0*  PLT 171 131* 130*   BMET Recent Labs    05/02/20 0112 05/03/20 0107 05/04/20 0137  NA 141 140 136  K 3.7 3.7 3.3*  CL 108 107 103  CO2 18* 19* 23  GLUCOSE 154* 170* 250*  BUN 45* 65* 70*  CREATININE 3.13* 4.11* 3.67*  CALCIUM 8.3* 7.5* 7.0*   LFT Recent Labs    05/04/20 0137  PROT 5.3*  ALBUMIN 2.1*  AST 310*  ALT 256*  ALKPHOS 30*  BILITOT 2.1*    Hepatitis Panel Recent Labs    05/01/20 1604  HEPBSAG NON REACTIVE  HCVAB NON REACTIVE  HEPAIGM NON REACTIVE  HEPBIGM NON REACTIVE         LOS: 3 days   Thornton [place]  05/04/2020, 7:35 AM

## 2020-05-04 NOTE — Progress Notes (Signed)
   Progress Note  Patient Name: Nathan [name] Date of Encounter: 05/04/2020  Primary Cardiologist: [name], MD  Please see rounding note from yesterday.  Interval chart reviewed.  Telemetry shows only rare PACs, no SVT.  Patient is afebrile, heart rate in the 60s to 70s in sinus rhythm, systolic blood pressure 847J.  Pertinent lab work includes potassium 3.3, creatinine 3.67, AST 310, ALT 256, WBC 16.1, hemoglobin 10.8, platelets 130.  Cardiac regimen now includes aspirin and heparin.  Low-dose bisoprolol was discontinued by Dr. Verlon Au.  Would hold off on statin therapy until LFTs improve.  Signed, Rozann Lesches, MD  05/04/2020, 11:25 AM

## 2020-05-04 NOTE — Plan of Care (Signed)
  Problem: Clinical Measurements: Goal: Will remain free from infection Outcome: Progressing Goal: Diagnostic test results will improve Outcome: Progressing Goal: Respiratory complications will improve Outcome: Progressing   Problem: Elimination: Goal: Will not experience complications related to urinary retention Outcome: Progressing   Problem: Pain Managment: Goal: General experience of comfort will improve Outcome: Progressing   Problem: Skin Integrity: Goal: Risk for impaired skin integrity will decrease Outcome: Progressing

## 2020-05-04 NOTE — Progress Notes (Signed)
ANTICOAGULATION CONSULT NOTE - Follow Up Consult  Pharmacy Consult for heparin Indication: NSTEMI and h/o DVT  Labs: Recent Labs    05/01/20 1019 05/01/20 1353 05/01/20 1604 05/02/20 0112 05/02/20 0112 05/02/20 2028 05/03/20 0107 05/03/20 1325 05/04/20 0137  HGB  --   --   --  12.5*   < >  --  10.9*  --  10.8*  HCT  --   --   --  37.3*  --   --  32.7*  --  31.0*  PLT  --   --   --  171  --   --  131*  --  130*  APTT  --   --   --   --   --  67*  --  59*  --   HEPARINUNFRC  --   --   --   --   --  0.39  --  0.27* 0.18*  CREATININE  --   --   --  3.13*  --   --  4.11*  --  3.67*  TROPONINIHS 305* 2,988* 5,012*  --   --   --   --   --   --    < > = values in this interval not displayed.    Assessment: 87yo male subtherapeutic on heparin despite rate increase, likely 2/2 Eliquis clearing; no gtt issues or signs of bleeding per RN.  Goal of Therapy:  Heparin level 0.3-0.7 units/ml   Plan:  Will increase heparin gtt by 2-3 units/kg/hr to 1500 units/hr and check level in 8 hours.    Wynona Neat, PharmD, BCPS  05/04/2020,2:59 AM

## 2020-05-04 NOTE — Progress Notes (Signed)
[place] for heparin Indication: hx DVT  No Known Allergies  Patient Measurements: Height: 6' (182.9 cm) Weight: 89.8 kg (198 lb) IBW/kg (Calculated) : 77.6 Heparin Dosing Weight: 89kg  Vital Signs: Temp: 98.1 F (36.7 C) (03/20 1949) Temp Source: Oral (03/20 1949) BP: 138/88 (03/20 1949) Pulse Rate: 71 (03/20 1949)  Labs: Recent Labs    05/02/20 0112 05/02/20 0112 05/02/20 2028 05/03/20 0107 05/03/20 1325 05/04/20 0137 05/04/20 1022 05/04/20 2246  HGB 12.5*  --   --  10.9*  --  10.8*  --   --   HCT 37.3*  --   --  32.7*  --  31.0*  --   --   PLT 171  --   --  131*  --  130*  --   --   APTT  --   --  67*  --  59* 59*  --   --   HEPARINUNFRC  --    < > 0.39  --  0.27* 0.18* 0.26* 0.22*  CREATININE 3.13*  --   --  4.11*  --  3.67*  --   --    < > = values in this interval not displayed.    Estimated Creatinine Clearance: 16.4 mL/min (A) (by C-G formula based on SCr of 3.67 mg/dL (H)).  Assessment: 21 yoM with hx DVT (2019) on apixaban at home admitted with epigastric pain now s/p perc chole. Pharmacy asked to begin IV heparin.  Heparin level subtherapeutic (0.22) on gtt at 1650 units/hr. Apixaban no longer affecting PTT so will not monitor anymore. No issues with line or bleeding reported per RN.  Goal of Therapy:  Heparin level 0.3-0.7 units/ml Monitor platelets by anticoagulation protocol: Yes   Plan:  Increase heparin to 1800 units/h  Heparin level in 8 hours  Sherlon Handing, PharmD, BCPS Please see amion for complete clinical pharmacist phone list 05/04/2020 11:38 PM

## 2020-05-04 NOTE — Progress Notes (Signed)
AQ:TMAUQ abdominal pain, n/v with cholelithiasis, elevated wbc and lft's   Subjective: Feels okay this morning.  Complains of sore throat but denies abdominal pain.  Objective: Vital signs in last 24 hours: Temp:  [97.5 F (36.4 C)-98.6 F (37 C)] 98.6 F (37 C) (03/20 0315) Pulse Rate:  [61-71] 61 (03/20 0315) Resp:  [17-20] 20 (03/20 0315) BP: (100-140)/(55-82) 125/65 (03/20 0315) SpO2:  [91 %-94 %] 92 % (03/20 0315)   Intake/Output from previous day: 03/19 0701 - 03/20 0700 In: 909.6 [I.V.:757; IV Piggyback:137.6] Out: 1250 [Urine:950; Drains:300] Intake/Output this shift: No intake/output data recorded.  General appearance: alert, cooperative, fatigued and no distress Resp: Slightly labored, on HFNC Cardio: regular rate and rhythm; denies any chest pain GI: soft, nondistended, minimally tender RUQ around drain. Drain output bilious Extremities: trace edema  Lab Results:  Recent Labs    05/03/20 0107 05/04/20 0137  WBC 26.0* 16.1*  HGB 10.9* 10.8*  HCT 32.7* 31.0*  PLT 131* 130*    BMET Recent Labs    05/03/20 0107 05/04/20 0137  NA 140 136  K 3.7 3.3*  CL 107 103  CO2 19* 23  GLUCOSE 170* 250*  BUN 65* 70*  CREATININE 4.11* 3.67*  CALCIUM 7.5* 7.0*   PT/INR No results for input(s): LABPROT, INR in the last 72 hours.  Recent Labs  Lab 05/01/20 0304 05/02/20 0112 05/03/20 0107 05/04/20 0137  AST 273* 364* 617* 310*  ALT 112* 191* 321* 256*  ALKPHOS 50 129* 15* 30*  BILITOT 2.0* 6.0* 3.2* 2.1*  PROT 6.6 6.1* 5.6* 5.3*  ALBUMIN 3.3* 2.7* 2.4* 2.1*     Lipase     Component Value Date/Time   LIPASE 43 05/01/2020 0304     Medications: . aspirin EC  81 mg Oral Daily  . Chlorhexidine Gluconate Cloth  6 each Topical Daily  . insulin aspart  0-6 Units Subcutaneous Q4H  . potassium chloride  40 mEq Oral Daily  . sodium chloride flush  5 mL Intracatheter Q8H    Assessment/Plan CAD s/p CABG x 5 in 2002 w/ last cath in 2020 w/  occluded SVG diagonal and SVG-marginal CHF (EF 40-45% in 2020) Hx of HTN Hx of HLD Hx of diabetes CKD acute on chronic  - creatinine Creatinine 3.67 Hx of DVT on Eliquis  -  currently on hold, heparin gtt  2 Syncopal episodes at home - reports he lives alone. Defer workup to TRH. May need CM to see Elevated Tn's with EKG changes and tachycardia - Cardiology following..    - Troponin 28>>24>>305>>2988>>5012 on 3/17   Epigastric abdominal pain, n/v Cholelithiasis/cholecystitis - Hx of perc chole drain that was placed on 01/21/2015 by IR and removed on 03/25/2015.    Right upper quadrant ultrasound with gallbladder sludge but no gallbladder wall thickening, pericholecystic fluid or Murphy sign.  CBD 5 mm.  He did have hepatic steatosis.  CT A/P showed cholelithiasis with no acute findings or specific cause of his symptoms.   - HIDA: Diffuse and persistent radiotracer activity identified throughout the liver with no signs of biliary activity after 1 hour of imaging. Differential consideration of these findings include hepatocellular dysfunction secondary to hepatitis versus high-grade bile duct obstruction. No gallbladder activity visualized. - Agree with IV Zosyn in the meantime to cover.   - s/p PCT 05/02/20. Tbili down, cr up and wbc downtrending.    - GI following also. W abnormal hida and elevated lfts would consider mrcp if labs stall  -  No plans for surgery at this time.  He will ultimately have outpatient surgery follow-up, but most likely the drain will be definitive treatment for his gallbladder      LOS: 3 days    Clovis Riley 05/04/2020 Please see Amion

## 2020-05-05 DIAGNOSIS — R7989 Other specified abnormal findings of blood chemistry: Secondary | ICD-10-CM | POA: Diagnosis not present

## 2020-05-05 DIAGNOSIS — I248 Other forms of acute ischemic heart disease: Secondary | ICD-10-CM

## 2020-05-05 DIAGNOSIS — R109 Unspecified abdominal pain: Secondary | ICD-10-CM | POA: Diagnosis not present

## 2020-05-05 DIAGNOSIS — K810 Acute cholecystitis: Secondary | ICD-10-CM

## 2020-05-05 LAB — COMPREHENSIVE METABOLIC PANEL
ALT: 221 U/L — ABNORMAL HIGH (ref 0–44)
AST: 178 U/L — ABNORMAL HIGH (ref 15–41)
Albumin: 2.3 g/dL — ABNORMAL LOW (ref 3.5–5.0)
Alkaline Phosphatase: 37 U/L — ABNORMAL LOW (ref 38–126)
Anion gap: 11 (ref 5–15)
BUN: 61 mg/dL — ABNORMAL HIGH (ref 8–23)
CO2: 22 mmol/L (ref 22–32)
Calcium: 7.3 mg/dL — ABNORMAL LOW (ref 8.9–10.3)
Chloride: 105 mmol/L (ref 98–111)
Creatinine, Ser: 3.09 mg/dL — ABNORMAL HIGH (ref 0.61–1.24)
GFR, Estimated: 19 mL/min — ABNORMAL LOW (ref >60)
Glucose, Bld: 177 mg/dL — ABNORMAL HIGH (ref 70–99)
Potassium: 3.7 mmol/L (ref 3.5–5.1)
Sodium: 138 mmol/L (ref 135–145)
Total Bilirubin: 2.4 mg/dL — ABNORMAL HIGH (ref 0.3–1.2)
Total Protein: 5.9 g/dL — ABNORMAL LOW (ref 6.5–8.1)

## 2020-05-05 LAB — CBC
HCT: 33.3 % — ABNORMAL LOW (ref 39.0–52.0)
Hemoglobin: 11.6 g/dL — ABNORMAL LOW (ref 13.0–17.0)
MCH: 30.8 pg (ref 26.0–34.0)
MCHC: 34.8 g/dL (ref 30.0–36.0)
MCV: 88.3 fL (ref 80.0–100.0)
Platelets: 135 10*3/uL — ABNORMAL LOW (ref 150–400)
RBC: 3.77 MIL/uL — ABNORMAL LOW (ref 4.22–5.81)
RDW: 14.4 % (ref 11.5–15.5)
WBC: 16.4 10*3/uL — ABNORMAL HIGH (ref 4.0–10.5)
nRBC: 0.2 % (ref 0.0–0.2)

## 2020-05-05 LAB — GLUCOSE, CAPILLARY
Glucose-Capillary: 139 mg/dL — ABNORMAL HIGH (ref 70–99)
Glucose-Capillary: 180 mg/dL — ABNORMAL HIGH (ref 70–99)
Glucose-Capillary: 163 mg/dL — ABNORMAL HIGH (ref 70–99)
Glucose-Capillary: 151 mg/dL — ABNORMAL HIGH (ref 70–99)

## 2020-05-05 LAB — MAGNESIUM: Magnesium: 1.9 mg/dL (ref 1.7–2.4)

## 2020-05-05 LAB — HEPARIN LEVEL (UNFRACTIONATED): Heparin Unfractionated: 0.41 IU/mL (ref 0.30–0.70)

## 2020-05-05 MED ORDER — APIXABAN 2.5 MG PO TABS
2.5000 mg | ORAL_TABLET | Freq: Two times a day (BID) | ORAL | Status: DC
  Administered 2020-05-05 – 2020-05-12 (×15): 2.5 mg via ORAL
  Filled 2020-05-05 (×15): qty 1

## 2020-05-05 NOTE — Care Management Important Message (Signed)
Important Message  Patient Details  Name: Nathan [name] MRN: [id number] Date of Birth: 04-02-1933   Medicare Important Message Given:  Yes     Major [place] Montine Circle 05/05/2020, 3:19 PM

## 2020-05-05 NOTE — Evaluation (Signed)
Occupational Therapy Evaluation Patient Details Name: Nathan [name] MRN: [id number] DOB: 1933/07/10 Today's Date: 05/05/2020    History of Present Illness Pt is an 87 y/o male admitted secondary to increased abdominal pain on 3/17. Found to have cholelithiasis and is s/p percutaneous cholecystostomy on 3/18. Pt also found to have NSTEMI. PMH includes DM, HTN, CHF, CKD, and CAD s/p CABG.   Clinical Impression   Pt admitted with the above diagnoses and presents with below problem list. Pt will benefit from continued acute OT to address the below listed deficits and maximize independence with basic ADLs prior to d/c to venue below. At baseline, pt is independent with ADLs, golfs, drives, lives alone. Pt currently needs up to min A with LB ADLs and functional transfers, min guard assist using rw to walk bathroom distances. Feel that by time of d/c pt will be able to go home with initial supervision; discussed this with daughter who was present throughout session.      Follow Up Recommendations  Home health OT;Other (comment) (Initial 24 hour supervision)    Equipment Recommendations  3 in 1 bedside commode    Recommendations for Other Services       Precautions / Restrictions Precautions Precautions: Fall Precaution Comments: percutaneous drain on R. Restrictions Weight Bearing Restrictions: No      Mobility Bed Mobility Overal bed mobility: Needs Assistance Bed Mobility: Supine to Sit;Sit to Supine     Supine to sit: Min assist Sit to supine: Min guard   General bed mobility comments: Pt reaching for therapist arm to pull on during trunk elevation. Able to scoot hips to EOB position with extra time/effort    Transfers Overall transfer level: Needs assistance Equipment used: Rolling walker (2 wheeled) Transfers: Sit to/from Stand Sit to Stand: Min guard;Min assist         General transfer comment: min A to fully boost on inital stand from EOB then min guard to stand from  3n1 over toilet.    Balance Overall balance assessment: Needs assistance Sitting-balance support: No upper extremity supported;Feet supported Sitting balance-[name]: Fair     Standing balance support: Bilateral upper extremity supported;During functional activity Standing balance-[name]: Fair                             ADL either performed or assessed with clinical judgement   ADL Overall ADL's : Needs assistance/impaired Eating/Feeding: Set up;Sitting   Grooming: Set up;Sitting   Upper Body Bathing: Set up;Sitting   Lower Body Bathing: Min guard;Minimal assistance;Sit to/from stand   Upper Body Dressing : Set up;Sitting   Lower Body Dressing: Min guard;Minimal assistance;Sit to/from stand   Toilet Transfer: Min Lobbyist Details (indicate cue type and reason): 3n1 over toilet. cues for technique with rw Toileting- Clothing Manipulation and Hygiene: Set up;Min guard;Sitting/lateral lean;Sit to/from stand       Functional mobility during ADLs: Min guard;Rolling walker General ADL Comments: Pt walked EOB<>bathroom to practice toilet transfers. Min A to boost from EOB on initial stand from EOB, min guard from 3n1.     Vision Baseline Vision/History: Wears glasses Wears Glasses: At all times       Perception     Praxis      Pertinent Vitals/Pain Pain Assessment: Faces Faces Pain Scale: Hurts a little bit Pain Location: abdomen Pain Descriptors / Indicators: Aching;Operative site guarding Pain Intervention(s): Monitored during session;Repositioned;Limited activity within patient's tolerance  Hand Dominance     Extremity/Trunk Assessment Upper Extremity Assessment Upper Extremity Assessment: Generalized weakness   Lower Extremity Assessment Lower Extremity Assessment: Defer to PT evaluation;Generalized weakness   Cervical / Trunk Assessment Cervical / Trunk Assessment: Kyphotic   Communication  Communication Communication: No difficulties   Cognition Arousal/Alertness: Awake/alert Behavior During Therapy: WFL for tasks assessed/performed Overall Cognitive Status: Within Functional Limits for tasks assessed                                     General Comments  On 4L O2NC throughout session with sats >94. Discussed with RN that he may be ready to beginning weaning down his O2 (baseline is RA)    Exercises     Shoulder Instructions      Home Living Family/patient expects to be discharged to:: Private residence Living Arrangements: Alone Available Help at Discharge: Family;Available PRN/intermittently Type of Home: Mobile home Home Access: Stairs to enter Entrance Stairs-Number of Steps: 2 Entrance Stairs-Rails: None Home Layout: One level     Bathroom Shower/Tub: Teacher, early years/pre: Standard     Home Equipment: Environmental consultant - 2 wheels          Prior Functioning/Environment Level of Independence: Independent        Comments: Still driving. Independent with mobility and ADLs. Golfs.        OT Problem List: Decreased strength;Decreased activity tolerance;Impaired balance (sitting and/or standing);Decreased knowledge of use of DME or AE;Decreased knowledge of precautions;Cardiopulmonary status limiting activity;Pain      OT Treatment/Interventions: Self-care/ADL training;Therapeutic exercise;Energy conservation;DME and/or AE instruction;Therapeutic activities;Patient/family education;Balance training    OT Goals(Current goals can be found in the care plan section) Acute Rehab OT Goals Patient Stated Goal: to feel better OT Goal Formulation: With patient/family Time For Goal Achievement: 05/19/20 Potential to Achieve Goals: Good ADL Goals Pt Will Perform Lower Body Bathing: with modified independence;sit to/from stand Pt Will Perform Lower Body Dressing: with modified independence;sit to/from stand Pt Will Transfer to Toilet: with  modified independence;ambulating Pt Will Perform Toileting - Clothing Manipulation and hygiene: with modified independence;sit to/from stand Pt Will Perform Tub/Shower Transfer: with supervision;ambulating;shower seat;rolling walker Additional ADL Goal #1: Pt will complete bed mobility at mod I level to prepare for OOB/EOB ADLs.  OT Frequency: Min 2X/week   Barriers to D/C: Decreased caregiver support  lives alone. Discussed with daughter recommendation to have someone with him the first couple of days at home.       Co-evaluation              AM-PAC OT "6 Clicks" Daily Activity     Outcome Measure Help from another person eating meals?: None Help from another person taking care of personal grooming?: None Help from another person toileting, which includes using toliet, bedpan, or urinal?: A Little Help from another person bathing (including washing, rinsing, drying)?: A Little Help from another person to put on and taking off regular upper body clothing?: None Help from another person to put on and taking off regular lower body clothing?: A Little 6 Click Score: 21   End of Session Equipment Utilized During Treatment: Rolling walker;Oxygen (4L) Nurse Communication: Other (comment);Mobility status (see general comments)  Activity Tolerance: Patient limited by fatigue;Patient tolerated treatment well Patient left: in bed;with call bell/phone within reach;with family/visitor present  OT Visit Diagnosis: Unsteadiness on feet (R26.81);Pain;Muscle weakness (generalized) (M62.81)  Time: 1041-1100 OT Time Calculation (min): 19 min Charges:  OT General Charges $OT Visit: 1 Visit OT Evaluation $OT Eval Low Complexity: [place], OT Acute Rehabilitation Services Pager: [phone] Office: [phone]   Hortencia Pilar 05/05/2020, 11:51 AM

## 2020-05-05 NOTE — Plan of Care (Signed)

## 2020-05-05 NOTE — Progress Notes (Signed)
PROGRESS NOTE   Nathan [name]  VZD:[id number] DOB: 1933-05-13 DOA: 05/01/2020 PCP: Leanna Battles, MD  Brief Narrative:  59 white male  CABG X5 2002 nuke stress 2017 and ICM EF 40-45% 04/06/2018 L LE DVT 09/19/2017 Prior cholecystitis (deferred lap chole)-Normal HIDA 2019 CKD 4 baseline HLD DM TY 2  Represent [facility] ED 3/16 abdominal pain S OB, nausea/vomit Transaminitis 200 range bili 2.0 CT = GB stone RUQ USG = sludge - cholecystitis choledocholithiasis  General surgery, GI, cardiology, critical care all consulted on admission Patient confirmed DNR at bedside  Hospital-Problem based course  Severe gram-negative sepsis (on cholecystotomy culture) on admission with multiorgan dysfunction syndrome secondary to acute cholecystitis Much improved-saline lock IV-diet as tolerated Patient confirmed to be no CODE BLUE Poor lap chole candidate-IR coordinating 8 weekly drain exchanges Expected transaminases to continue to drop slowly--alk phos is low indicative of nonobstructive pattern and I do not know if getting an MRCP would change planning as he is not a candidate for operative management per specialists NSTEMI on admission troponins now 5000-likely demand ischemia Underlying CAD CABG X5 EF 45% this admit mild decrease in function Heparin DC 3/21 (received for over 48 hours)-continue asa 81 Hydralazine changed to IV metoprolol every 2 as needed pressure over 180 Could not tolerate bisoprolol bradycardic, hypotensive therefore consolidated on 3/22 metoprolol 12.5 daily XL-sinus rhythm currently Keep volumes even today perioperative fluids discontinued-no Lasix for now DVT 2019 We will consider resumption of Eliquis in the next several days- He is postop so may be a high risk for DVT but other than that he should not necessarily require anticoagulation Worsening ATN 2/2 sepsis physiology most likely superimposed on CKD 4 fractional excretion of sodium = 0.9 i =  prerenal component US  kidney no hydro- Foley catheter clamped today in anticipation of discontinue the same +927 cc--weights are inaccurate Nursing requested to kindly get standing weights DM TY 2-diet controlled? Strict glycemic control sliding scale ordered-CBGs 139-196 Do not add long-acting at this time  DVT prophylaxis: IV heparin therapeutic dosing Code Status: DNR confirmed Family Communication: Long discussion at the bedside with daughter   Disposition:  Status is: Inpatient  Remains inpatient appropriate because:Ongoing active pain requiring inpatient pain management, Altered mental status, Ongoing diagnostic testing needed not appropriate for outpatient work up and Inpatient level of care appropriate due to severity of illness   Dispo: The patient is from: Home              Anticipated d/c is to: SNF              Patient currently is not medically stable to d/c.   Difficult to place patient No  Consultants:   Multiple as above  Procedures: Cholecystotomy 3/18  Antimicrobials: Vancomycin Zosyn   Subjective:  In good spirits-marginally eating-no chest pain-cardiology is seen this morning-daughter in room currently Had 2 stools today soft in consistency but no fevers Seems to be much more coherent and close to his usual baseline per daughter Still on oxygen 2 L  Objective: Vitals:   05/04/20 2345 05/05/20 0343 05/05/20 0900 05/05/20 0907  BP: 122/88 136/68  (!) 159/79  Pulse: (!) 55 63  (!) 53  Resp: 12 15  20   Temp: 98.3 F (36.8 C) 98.1 F (36.7 C) (!) 97 F (36.1 C) 97.8 F (36.6 C)  TempSrc: Oral Oral  Oral  SpO2: 93% 92%  96%  Weight:      Height:  Intake/Output Summary (Last 24 hours) at 05/05/2020 1028 Last data filed at 05/05/2020 0900 Gross per 24 hour  Intake 2778.83 ml  Output 3675 ml  Net -896.17 ml   Filed Weights   05/01/20 0228  Weight: 89.8 kg    Examination:  EOMI NCAT no focal deficit Neck soft supple no lymphadenopathy Mallampati  1 S1-S2 no murmur-sinus, sinus bradycardia with less than 2-second pauses on telemetry Abdomen soft with drain in right upper quadrant Foley catheter in place Abdomen slightly distended no rebound No lower extremity edema Chest clear no rales no rhonchi decreased air entry Neurologically grossly intact to power-rest of neuro exam deferred Psych euthymic   Data Reviewed: personally reviewed   Data BUNs/creatinine 65/4.11-->61/3.09 Magnesium 0.8-->1.9 AST/ALT 617/321-->178/221 WBC 16.4 hemoglobin 11.6 platelet 135   CBC    Component Value Date/Time   WBC 16.4 (H) 05/05/2020 0129   RBC 3.77 (L) 05/05/2020 0129   HGB 11.6 (L) 05/05/2020 0129   HGB 13.6 04/04/2018 1203   HCT 33.3 (L) 05/05/2020 0129   HCT 41.1 04/04/2018 1203   PLT 135 (L) 05/05/2020 0129   PLT 189 04/04/2018 1203   MCV 88.3 05/05/2020 0129   MCV 91 04/04/2018 1203   MCH 30.8 05/05/2020 0129   MCHC 34.8 05/05/2020 0129   RDW 14.4 05/05/2020 0129   RDW 13.3 04/04/2018 1203   LYMPHSABS 4.1 (H) 02/19/2015 0305   MONOABS 1.7 (H) 02/19/2015 0305   EOSABS 0.2 02/19/2015 0305   BASOSABS 0.1 02/19/2015 0305   CMP Latest Ref Rng & Units 05/05/2020 05/04/2020 05/03/2020  Glucose 70 - 99 mg/dL 177(H) 250(H) 170(H)  BUN 8 - 23 mg/dL 61(H) 70(H) 65(H)  Creatinine 0.61 - 1.24 mg/dL 3.09(H) 3.67(H) 4.11(H)  Sodium 135 - 145 mmol/L 138 136 140  Potassium 3.5 - 5.1 mmol/L 3.7 3.3(L) 3.7  Chloride 98 - 111 mmol/L 105 103 107  CO2 22 - 32 mmol/L 22 23 19(L)  Calcium 8.9 - 10.3 mg/dL 7.3(L) 7.0(L) 7.5(L)  Total Protein 6.5 - 8.1 g/dL 5.9(L) 5.3(L) 5.6(L)  Total Bilirubin 0.3 - 1.2 mg/dL 2.4(H) 2.1(H) 3.2(H)  Alkaline Phos 38 - 126 U/L 37(L) 30(L) 15(L)  AST 15 - 41 U/L 178(H) 310(H) 617(H)  ALT 0 - 44 U/L 221(H) 256(H) 321(H)     Radiology Studies: No results found.   Scheduled Meds: . aspirin EC  81 mg Oral Daily  . Chlorhexidine Gluconate Cloth  6 each Topical Daily  . furosemide  40 mg Oral Daily  . insulin  aspart  0-6 Units Subcutaneous Q4H  . metoprolol succinate  12.5 mg Oral Daily  . potassium chloride  40 mEq Oral Daily  . sodium chloride flush  5 mL Intracatheter Q8H   Continuous Infusions: . sodium chloride Stopped (05/03/20 0328)  . heparin 1,800 Units/hr (05/05/20 0900)  . piperacillin-tazobactam (ZOSYN)  IV 2.25 g (05/05/20 0551)     LOS: 4 days   Time spent: 40  Nita Sells, MD [facility] To contact the attending provider between 7A-7P or the covering provider during after hours 7P-7A, please log into the web site [email or web address] and access using universal [place] password for that web site. If you do not have the password, please call the hospital operator.  05/05/2020, 10:28 AM

## 2020-05-05 NOTE — Progress Notes (Signed)
[facility] Progress Note  CC:  Acute calculus cholecystitis  Subjective: Decreased appetite. He ate 1/2 sandwich and pudding for lunch. No N/V. No abdominal pan. He passed 2 loose nonbloody BMs earlier today. No other complaints at this time. No family at the bed side.    Objective:  Vital signs in last 24 hours: Temp:  [97 F (36.1 C)-98.3 F (36.8 C)] 97.6 F (36.4 C) (03/21 1036) Pulse Rate:  [53-71] 55 (03/21 1036) Resp:  [12-20] 18 (03/21 1036) BP: (122-159)/(68-88) 157/72 (03/21 1036) SpO2:  [92 %-96 %] 96 % (03/21 0907)   General:   Alert fatigued appearing 87 year old male in NAD.  Heart: RRR, systolic murmur.  Pulm:  Few crackles in the RLL otherwise clear.  Abdomen: Soft, nondistended. + BS x 4 quads. No HSM. RUQ perc drain site with mild tenderness, drain bag with approximately 100cc bilious fluid.  Extremities:  Without edema. Neurologic:  Alert and  oriented x4;  grossly normal neurologically. Psych:  Alert and cooperative. Normal mood and affect.  Intake/Output from previous day: 03/20 0701 - 03/21 0700 In: 2923.6 [P.O.:360; I.V.:2358.6; IV Piggyback:200] Out: 3675 [Urine:3550; Drains:125] Intake/Output this shift: Total I/O In: 100.2 [I.V.:100.2] Out: 350 [Urine:350]  Lab Results: Recent Labs    05/03/20 0107 05/04/20 0137 05/05/20 0129  WBC 26.0* 16.1* 16.4*  HGB 10.9* 10.8* 11.6*  HCT 32.7* 31.0* 33.3*  PLT 131* 130* 135*   BMET Recent Labs    05/03/20 0107 05/04/20 0137 05/05/20 0129  NA 140 136 138  K 3.7 3.3* 3.7  CL 107 103 105  CO2 19* 23 22  GLUCOSE 170* 250* 177*  BUN 65* 70* 61*  CREATININE 4.11* 3.67* 3.09*  CALCIUM 7.5* 7.0* 7.3*   LFT Recent Labs    05/05/20 0129  PROT 5.9*  ALBUMIN 2.3*  AST 178*  ALT 221*  ALKPHOS 37*  BILITOT 2.4*   PT/INR No results for input(s): LABPROT, INR in the last 72 hours. Hepatitis Panel No results for input(s): HEPBSAG, HCVAB, HEPAIGM, HEPBIGM in the last 72  hours.  No results found.  Assessment / Plan:  28.  87 year old male with acute calculus cholecystitis. Right upper quadrant ultrasound with gallbladder sludge but no gallbladder wall thickening, pericholecystic fluid or Murphy sign, CBD 5 mm and hepatic steatosis. CT A/P showed cholelithiasis. HIDA: Diffuse and persistent radiotracer activity identified throughout the liver with no signs of biliary activity after 1 hour of imaging.  S/P cholecystostomy tube placement 05/02/2020 with 125cc drainage past 24 hours. On Zosyn IV. Persistent leukocytosis 16.1 -> 16.4. LFTs drifting downward.  AST 319 -> 178.  ALT 256 - > 221.  Total bili 2.4 -> 2.1.  Alk phos 37. -No plans for MRCP as T. Bili and LFTs continue to drift downward  -CBC, hepatic panel in am -Diet as tolerated  -Ondansetron 49m po Q 6 hrs PRN -Further recommendations per Dr.Mansouraty.   2. Normocytic anemia.  Hg 11.6. No overt GI bleeding.   3. Thrombocytopenia. PLT 135.    4. AKI on CKD. Cr 3.67 -> 3.09.  5. CAD s/p CABG. Ischemic cardiomyopathy. LV EF 40 - 45%.   6. History of DVT. Heparin discontinued. Eliquis restarted.       Principal Problem:   Abdominal pain Active Problems:   Hypercholesteremia   Diabetes mellitus type 2 with complications (HCC)   Essential hypertension   Chronic combined systolic and diastolic CHF (congestive heart failure) (HCC)   Stage 4 chronic  kidney disease ([place])   Calculus of gallbladder with biliary obstruction but without cholecystitis   Elevated LFTs     LOS: 4 days   Noralyn Pick  05/05/2020, 2:54 PM

## 2020-05-05 NOTE — Progress Notes (Signed)
Pt bladder scan, 467, notified Dr. Verlon Au, he advised to place foley back.  I discussed with pt.  He advised that he did not want it right now.  I advised him that if he collected more urine, we would possibly need to replace the foley. He said that right now, he would like to wait and see what happens.

## 2020-05-05 NOTE — Progress Notes (Addendum)
CC: Abdominal pain  Subjective: Patient sitting up in bed eating breakfast.  He is not crazy about the food but has no abdominal pain.  Drain is working well.  Objective: Vital signs in last 24 hours: Temp:  [97.7 F (36.5 C)-98.3 F (36.8 C)] 98.1 F (36.7 C) (03/21 0343) Pulse Rate:  [55-71] 63 (03/21 0343) Resp:  [12-18] 15 (03/21 0343) BP: (122-138)/(68-88) 136/68 (03/21 0343) SpO2:  [92 %-93 %] 92 % (03/21 0343)  360 p.o.  2358 IV 3550 urine Drain 125 Afebrile vital signs are stable sats 90 to 93% on 4 L nasal cannula. T bilirubin 2.0>> 6.0>> 3.2>> 2.1>> 2.4 Alk phos 50>> 129>> 50>> 30> 37 AST 273>> 364>> 617>> 310>> 178 ALT 112>> 191>> 321>> 250>> 221 WBC 10.8>>6.3>>26.0>>16.0>>16.4(3/21) Creatinine 2.24>> 3.13>> 4.11>> 3.67>> 3.09  Intake/Output from previous day: 03/20 0701 - 03/21 0700 In: 2923.6 [P.O.:360; I.V.:2358.6; IV Piggyback:200] Out: 9774 [Urine:3550; Drains:125] Intake/Output this shift: No intake/output data recorded.  General appearance: alert, cooperative and no distress Resp: clear to auscultation bilaterally GI: Soft he is not really distended or tender.  No real abdominal complaints.  He did have a small bowel movement yesterday.  Lab Results:  Recent Labs    05/04/20 0137 05/05/20 0129  WBC 16.1* 16.4*  HGB 10.8* 11.6*  HCT 31.0* 33.3*  PLT 130* 135*    BMET Recent Labs    05/04/20 0137 05/05/20 0129  NA 136 138  K 3.3* 3.7  CL 103 105  CO2 23 22  GLUCOSE 250* 177*  BUN 70* 61*  CREATININE 3.67* 3.09*  CALCIUM 7.0* 7.3*   PT/INR No results for input(s): LABPROT, INR in the last 72 hours.  Recent Labs  Lab 05/01/20 0304 05/02/20 0112 05/03/20 0107 05/04/20 0137 05/05/20 0129  AST 273* 364* 617* 310* 178*  ALT 112* 191* 321* 256* 221*  ALKPHOS 50 129* 15* 30* 37*  BILITOT 2.0* 6.0* 3.2* 2.1* 2.4*  PROT 6.6 6.1* 5.6* 5.3* 5.9*  ALBUMIN 3.3* 2.7* 2.4* 2.1* 2.3*     Lipase     Component Value Date/Time    LIPASE 43 05/01/2020 0304   . sodium chloride Stopped (05/03/20 0328)  . heparin 1,800 Units/hr (05/04/20 2348)  . piperacillin-tazobactam (ZOSYN)  IV 2.25 g (05/05/20 0551)     Medications: . aspirin EC  81 mg Oral Daily  . Chlorhexidine Gluconate Cloth  6 each Topical Daily  . furosemide  40 mg Oral Daily  . insulin aspart  0-6 Units Subcutaneous Q4H  . metoprolol succinate  12.5 mg Oral Daily  . potassium chloride  40 mEq Oral Daily  . sodium chloride flush  5 mL Intracatheter Q8H    Assessment/Plan CAD s/p CABGx 5 in 2002 w/ last cath in 2020 w/ occluded SVG diagonal and SVG-marginal CHF (EF 40-45% in2020) Hx of HTN Hx of HLD Hx ofdiabetes CKD acute on chronic  - Creatinine 2.24>> 3.13>> 4.11>> 3.67>> 3.09(3/21)  Hx ofDVT on Eliquis - currently on hold, heparin gtt  2 Syncopal episodes at home - reports he lives alone. Defer workup to TR[facility]. May need CM to see Elevated Tn's with EKG changes and tachycardia - Cardiology following..    - Troponin 28>>24>>305>>2988>>5012 on 3/17   Epigastric abdominal pain, n/v Cholelithiasis/cholecystitis - Hx of perc choledrain that was placed on 01/21/2015 by IR and removed on 03/25/2015. Right upper quadrant ultrasound with gallbladder sludge but no gallbladder wall thickening, pericholecystic fluid or Murphy sign. CBD 5 mm. He did have  hepatic steatosis. CT A/P showed cholelithiasis with no acute findings or specific cause of his symptoms.  - HIDA: Diffuse and persistent radiotracer activity identified throughout the liver with no signs of biliary activity after 1 hour of imaging. Differential consideration of these findings include hepatocellular dysfunction secondary to hepatitis versus high-grade bile duct obstruction. No gallbladder activity visualized. - Agree with IV Zosyn in the meantime to cover.    - T bilirubin 2.0>> 6.0>> 3.2>> 2.1>> 2.4(3/21)     Alk phos 50>> 129>> 50>> 30> 37(3/21     AST 273>> 364>> 617>> 310>>  178(3/21)     ALT 112>> 191>> 321>> 250>> 221(3/21)  - WBC 10.8>>6.3>>26.0>>16.0>>16.4(3/21)  - s/p PCT 05/02/20. Tbili down, cr up and wbc downtrending.    - GI following also. W abnormal hida and elevated lfts would consider mrcp if labs stall  - No plans for surgery at this time.  He will ultimately have outpatient surgery follow-up, but most likely the drain will be definitive treatment for his gallbladder  FEN: Carb mod diet ID: Zosyn 3/17>> day 5 DVT: Heparin drip Follow-up: Dr. Maryann Conners radiology    Plan: He will need follow-up with IR and then Dr. Marlou Starks in about 6 weeks.  No further surgical indication at this time.  Defer to GI on possible MRCP/ERCP.  We will be available as needed.  I will get him an appointment follow up information in the AVS.  He should go back after IR does outpatient imaging.    LOS: 4 days    Maiana Hennigan 05/05/2020 Please see Amion

## 2020-05-05 NOTE — Progress Notes (Addendum)
[place] for heparin Indication: hx DVT  No Known Allergies  Patient Measurements: Height: 6' (182.9 cm) Weight: 89.8 kg (198 lb) IBW/kg (Calculated) : 77.6 Heparin Dosing Weight: 89kg  Vital Signs: Temp: 97.8 F (36.6 C) (03/21 0907) Temp Source: Oral (03/21 0907) BP: 159/79 (03/21 0907) Pulse Rate: 53 (03/21 0907)  Labs: Recent Labs    05/02/20 2028 05/02/20 2028 05/03/20 0107 05/03/20 1325 05/04/20 0137 05/04/20 1022 05/04/20 2246 05/05/20 0129 05/05/20 0808  HGB  --    < > 10.9*  --  10.8*  --   --  11.6*  --   HCT  --   --  32.7*  --  31.0*  --   --  33.3*  --   PLT  --   --  131*  --  130*  --   --  135*  --   APTT 67*  --   --  59* 59*  --   --   --   --   HEPARINUNFRC 0.39  --   --  0.27* 0.18* 0.26* 0.22*  --  0.41  CREATININE  --   --  4.11*  --  3.67*  --   --  3.09*  --    < > = values in this interval not displayed.    Estimated Creatinine Clearance: 19.5 mL/min (A) (by C-G formula based on SCr of 3.09 mg/dL (H)).  Assessment: 18 yoM with hx DVT (2019) on apixaban at home admitted with epigastric pain now s/p perc chole. Pharmacy asked to begin IV heparin.  Heparin level came back therapeutic at 0.41, on heparin at 1800 units/hr. CBC stable. No s/sx of bleeding or infusion issues- did have some red tinged urine with foley removal, will monitor.   Discussed apixaban restart with teams - okay per cardiology and surgery if no further procedures planned  Goal of Therapy:  Heparin level 0.3-0.7 units/ml Monitor platelets by anticoagulation protocol: Yes   Plan:  Discontinue heparin infusion Start PTA dose apixaban 2.5 mg BID  Monitor CBC and for s/sx of bleeding  Antonietta Jewel, PharmD, BCCCP Clinical Pharmacist  Phone: [phone] 05/05/2020 12:16 PM  Please check AMION for all [place] phone numbers After 10:00 PM, call [place] [phone]

## 2020-05-05 NOTE — Plan of Care (Signed)
  Problem: Education: Goal: Knowledge of General Education information will improve Description: Including pain rating scale, medication(s)/side effects and non-pharmacologic comfort measures Outcome: Progressing   Problem: Clinical Measurements: Goal: Ability to maintain clinical measurements within normal limits will improve Outcome: Progressing   Problem: Nutrition: Goal: Adequate nutrition will be maintained Outcome: Progressing   Problem: Elimination: Goal: Will not experience complications related to urinary retention Outcome: Progressing   Problem: Pain Managment: Goal: General experience of comfort will improve Outcome: Progressing

## 2020-05-05 NOTE — Progress Notes (Signed)
Per Dr. Verlon Au verbal order to clamp foley at 10:30 AM, Pt could feel he had to urinate at 1047, per Dr. Verlon Au, verbal order to remove the foley.  I removed the foley, and there was a blood clot in the catheter.  Pt then urintated approximately 150 ml of reddish urine.  Dr. Verlon Au notified. New orders to bladder scan every 4 hours, and reinsert foley if needed. Will keep Dr. Verlon Au advised as needed  Will continue to monitor Pt

## 2020-05-05 NOTE — Hospital Course (Addendum)
Data BUNs/creatinine 65/4.11--> 50/2.7 and continues to trend downwards Magnesium 0.8-->1.9 AST/ALT 617/321-->178/221-->108/175 WBC 16.4-->15.2 Hemoglobin 12.3 platelet 135 1722

## 2020-05-05 NOTE — Progress Notes (Addendum)
Progress Note  Patient Name: Nathan [name] Date of Encounter: 05/05/2020  Primary Cardiologist: [name], MD  Subjective   Feels pretty good. No abd pain or chest pain.   Inpatient Medications    Scheduled Meds: . aspirin EC  81 mg Oral Daily  . Chlorhexidine Gluconate Cloth  6 each Topical Daily  . furosemide  40 mg Oral Daily  . insulin aspart  0-6 Units Subcutaneous Q4H  . metoprolol succinate  12.5 mg Oral Daily  . potassium chloride  40 mEq Oral Daily  . sodium chloride flush  5 mL Intracatheter Q8H   Continuous Infusions: . sodium chloride Stopped (05/03/20 0328)  . heparin 1,800 Units/hr (05/04/20 2348)  . piperacillin-tazobactam (ZOSYN)  IV 2.25 g (05/05/20 0551)   PRN Meds: sodium chloride, acetaminophen **OR** acetaminophen, albuterol, hydrALAZINE, HYDROcodone-acetaminophen, menthol-cetylpyridinium, metoprolol tartrate, ondansetron **OR** [DISCONTINUED] ondansetron (ZOFRAN) IV, phenol   Vital Signs    Vitals:   05/04/20 1601 05/04/20 1949 05/04/20 2345 05/05/20 0343  BP: 122/72 138/88 122/88 136/68  Pulse: 66 71 (!) 55 63  Resp: 16 18 12 15   Temp: 97.7 F (36.5 C) 98.1 F (36.7 C) 98.3 F (36.8 C) 98.1 F (36.7 C)  TempSrc: Oral Oral Oral Oral  SpO2: 92% 92% 93% 92%  Weight:      Height:        Intake/Output Summary (Last 24 hours) at 05/05/2020 0821 Last data filed at 05/05/2020 0551 Gross per 24 hour  Intake 2683.63 ml  Output 3675 ml  Net -991.37 ml   Filed Weights   05/01/20 0228  Weight: 89.8 kg    Telemetry    Sinus rhythm, first degree AVB, no ectopy or arrhythmia.  Personally reviewed.  ECG    An ECG dated 05/04/20 was personally reviewed today and demonstrated:  Sinus rhythm with first deg AVB, right bundle branch block and left anterior fascicular block.  Physical Exam   GEN: No acute distress.   Neck: No JVD Cardiac: RRR, soft systolic murmur.  Respiratory: Clear to auscultation bilaterally. GI: Soft, nontender,  non-distended. Drain with yellow fluid. MS: No edema; No deformity. Neuro:  Nonfocal  Psych: Normal affect   Labs    Chemistry Recent Labs  Lab 05/03/20 0107 05/04/20 0137 05/05/20 0129  NA 140 136 138  K 3.7 3.3* 3.7  CL 107 103 105  CO2 19* 23 22  GLUCOSE 170* 250* 177*  BUN 65* 70* 61*  CREATININE 4.11* 3.67* 3.09*  CALCIUM 7.5* 7.0* 7.3*  PROT 5.6* 5.3* 5.9*  ALBUMIN 2.4* 2.1* 2.3*  AST 617* 310* 178*  ALT 321* 256* 221*  ALKPHOS 15* 30* 37*  BILITOT 3.2* 2.1* 2.4*  GFRNONAA 14* 16* 19*  ANIONGAP 14 10 11      Hematology Recent Labs  Lab 05/03/20 0107 05/04/20 0137 05/05/20 0129  WBC 26.0* 16.1* 16.4*  RBC 3.59* 3.53* 3.77*  HGB 10.9* 10.8* 11.6*  HCT 32.7* 31.0* 33.3*  MCV 91.1 87.8 88.3  MCH 30.4 30.6 30.8  MCHC 33.3 34.8 34.8  RDW 14.5 14.4 14.4  PLT 131* 130* 135*    Cardiac Enzymes Recent Labs  Lab 05/01/20 0156 05/01/20 0431 05/01/20 1019 05/01/20 1353 05/01/20 1604  TROPONINIHS 28* 24* 305* 2,988* 5,012*    Radiology    US RENAL  Result Date: 05/03/2020 CLINICAL DATA:  87 year old male with acute tubular necrosis. EXAM: RENAL / URINARY TRACT ULTRASOUND COMPLETE COMPARISON:  Noncontrast CT Abdomen and Pelvis 05/01/2020. FINDINGS: Right Kidney: Renal measurements: 9.5 x 5.3  x 5.1 cm = volume: 137 mL. Renal cortical thinning (image 4) but preserved cortical echogenicity. No right hydronephrosis or right renal mass. Left Kidney: Renal measurements: 10.6 x 6.0 x 5.6 cm = volume: 185 mL. Better preserved left renal cortex (image 24). Normal cortical echogenicity. No left hydronephrosis or renal mass. Bladder: Decompressed by Foley catheter. Other: None. IMPRESSION: No acute renal finding. Right greater than left chronic renal cortical volume loss. Electronically Signed   By: Genevie Ann M.D.   On: 05/03/2020 10:47    Patient Profile     87 y.o. male with a history of CAD status post CABG, hypertension, hyperlipidemia, type 2 diabetes mellitus, CKD  stage IV, and chronic combined heart failure now presenting with cholecystitis and demand ischemia.  Assessment & Plan   1.  Demand ischemia - in the setting of physiologic stressors.  High-sensitivity troponin I up to 5012.  No active chest pain.  ECG shows right bundle branch block and left anterior fascicular block.  Currently on aspirin and heparin.  2.  Ischemic cardiomyopathy with LVEF 40 to 45%.  Heart Failure Therapy ACE-I/ARB/ARNI: cannot use with renal function as is BB: bisoprolol started but stopped by primary team. Consider reinitiation.  OMA:YOKH currently due to renal function. SGLT2I: none currently due to renal function.  Diuretic plan: currently on lasix 40 mg daily po. He is 1.3 L positive for admission. 2.55 L UOP yesterday.   3.  Valvular heart disease including mild to moderate mitral regurgitation (may be underestimated due to small flail segment) and mild to moderate aortic regurgitation.  4.  Multivessel CAD status post CABG.  Graft disease documented in 2020 including an occluded SVG to the diagonal and SVG to the OM.  SVG to PDA and sequentially PLA was found to be patent as was the LIMA to LAD.  He has been managed medically. Continue aspirin. - continues on heparin - from a cardiac standpoint, he has completed 48 hours of heparin s/p demand ischemia/NSTEMI. When safe from surgical standpoint can transition back to eliquis (this is for DVT indication). - hold statin for now with LFT abnormalities, resume at follow up outpatient if normalized.  5.  CKD stage IV-V, current creatinine 3.09.  Cardiac catheterization not being pursued at this point in light of very high risk of contrast nephropathy and potential need for hemodialysis.  6.  PSVT, rhythm has been stable by telemetry, none in 24 hours.   Signed, Elouise Munroe, MD  05/05/2020, 8:21 AM

## 2020-05-05 NOTE — Progress Notes (Signed)
Referring Physician(s): Dr. Romana Juniper  Supervising Physician: Markus Daft  Patient Status:  [facility] - In-pt  Chief Complaint: Acute Cholecystitis  Subjective: Sitting up in bed.  Attempting lunch but states 'I can't eat this food." Daughter at bedside.  Allergies: Patient has no known allergies.  Medications: Prior to Admission medications   Medication Sig Start Date End Date Taking? Authorizing Provider  albuterol (VENTOLIN HFA) 108 (90 Base) MCG/ACT inhaler Inhale 1-2 puffs into the lungs every 6 (six) hours as needed for shortness of breath or wheezing. 11/13/19  Yes [provider]  apixaban (ELIQUIS) 2.5 MG TABS tablet Take 2.5 mg by mouth 2 (two) times daily.   Yes [provider]  atorvastatin (LIPITOR) 40 MG tablet Take 40 mg by mouth daily. 02/02/15  Yes [provider]  Blood Glucose Monitoring Suppl (ONETOUCH VERIO) w/Device KIT  09/06/17  Yes [provider]  carvedilol (COREG) 6.25 MG tablet TAKE 1 TABLET BY MOUTH EVERY DAY Patient taking differently: Take 6.25 mg by mouth daily. 04/01/20  Yes Nahser, Wonda Cheng, MD  fenofibrate (TRICOR) 145 MG tablet Take 1 tablet (145 mg total) by mouth daily. 03/21/20  Yes Nahser, Wonda Cheng, MD  guaifenesin (ROBITUSSIN) 100 MG/5ML syrup Take 200 mg by mouth 3 (three) times daily as needed for cough or congestion.   Yes [provider]  hydrALAZINE (APRESOLINE) 25 MG tablet Take 1 tablet (25 mg total) by mouth 2 (two) times daily. 03/21/20  Yes Nahser, Wonda Cheng, MD  insulin NPH-regular Human (NOVOLIN 70/30) (70-30) 100 UNIT/ML injection Inject 20-35 Units into the skin See admin instructions. 35 units in the morning, 20 units at dinner   Yes [provider]  isosorbide mononitrate (IMDUR) 30 MG 24 hr tablet Take 1 tablet (30 mg total) by mouth daily. 07/10/19  Yes Nahser, Wonda Cheng, MD  Lancets Glory Rosebush ULTRASOFT) lancets  09/06/17  Yes [provider]  Multiple Vitamins-Minerals  (CENTRUM SILVER PO) Take 1 tablet by mouth daily.   Yes [provider]  nitroGLYCERIN (NITROSTAT) 0.4 MG SL tablet Place 1 tablet (0.4 mg total) under the tongue every 5 (five) minutes as needed for chest pain. 04/04/18  Yes Weaver, Scott T, PA-C  Omega-3 Fatty Acids (FISH OIL) 1200 MG CAPS Take 1,200 mg by mouth 2 (two) times daily.   Yes [provider]  omeprazole (PRILOSEC) 20 MG capsule Take 20 mg by mouth 2 (two) times daily.  01/06/11  Yes [provider]  ONETOUCH VERIO test strip  09/06/17  Yes [provider]  Vivian 1ML/31G 31G X 5/16" 1 ML MISC USE 1 TWICE DAILY AS DIRECTED 09/29/17  Yes [provider]  tamsulosin (FLOMAX) 0.4 MG CAPS capsule Take 0.4 mg by mouth at bedtime. 03/11/20  Yes [provider]  TOLAK 4 % CREA Apply to areas qhs x 3weeks 01/25/20  Yes [provider]     Vital Signs: BP (!) 157/72 (BP Location: Right Arm)    Pulse (!) 55    Temp 97.6 F (36.4 C) (Oral)    Resp 18    Ht 6' (1.829 m)    Wt 198 lb (89.8 kg)    SpO2 96%    BMI 26.85 kg/m   Physical Exam Vitals reviewed.  Constitutional:      Appearance: Normal appearance.  Cardiovascular:     Rate and Rhythm: Normal rate.  Pulmonary:     Effort: Pulmonary effort is normal. No respiratory distress.  Abdominal:     Palpations: Abdomen is soft.     Comments: Perc chole in place. Brown drainage in bag. ~125 mL output recorded.  Neurological:     Mental Status: He is alert.     Imaging: US RENAL  Result Date: 05/03/2020 CLINICAL DATA:  87 year old male with acute tubular necrosis. EXAM: RENAL / URINARY TRACT ULTRASOUND COMPLETE COMPARISON:  Noncontrast CT Abdomen and Pelvis 05/01/2020. FINDINGS: Right Kidney: Renal measurements: 9.5 x 5.3 x 5.1 cm = volume: 137 mL. Renal cortical thinning (image 4) but preserved cortical echogenicity. No right hydronephrosis or right renal mass. Left Kidney: Renal measurements: 10.6 x 6.0 x  5.6 cm = volume: 185 mL. Better preserved left renal cortex (image 24). Normal cortical echogenicity. No left hydronephrosis or renal mass. Bladder: Decompressed by Foley catheter. Other: None. IMPRESSION: No acute renal finding. Right greater than left chronic renal cortical volume loss. Electronically Signed   By: Genevie Ann M.D.   On: 05/03/2020 10:47   IR Perc Cholecystostomy  Result Date: 05/02/2020 INDICATION: 87 year old male referred for percutaneous cholecystostomy EXAM: CHOLECYSTOSTOMY; IR ULTRASOUND GUIDANCE MEDICATIONS: None ANESTHESIA/SEDATION: Moderate (conscious) sedation was employed during this procedure. A total of Versed 1.0 mg and Fentanyl 25 mcg was administered intravenously. Moderate Sedation Time: 12 minutes. The patient's level of consciousness and vital signs were monitored continuously by radiology nursing throughout the procedure under my direct supervision. FLUOROSCOPY TIME:  Fluoroscopy Time: 0 minutes 6 seconds (7 mGy). COMPLICATIONS: None PROCEDURE: Informed written consent was obtained from the patient and the patient's family after a thorough discussion of the procedural risks, benefits and alternatives. All questions were addressed. Maximal Sterile Barrier Technique was utilized including caps, mask, sterile gowns, sterile gloves, sterile drape, hand hygiene and skin antiseptic. A timeout was performed prior to the initiation of the procedure. Ultrasound survey of the right upper quadrant was performed for planning purposes. Once the patient is prepped and draped in the usual sterile fashion, the skin and subcutaneous tissues overlying the gallbladder were generously infiltrated 1% lidocaine for local anesthesia. A coaxial needle was advanced under ultrasound guidance through the skin subcutaneous tissues and a small segment of liver into the gallbladder lumen. With removal of the stylet, spontaneous dark bile drainage occurred. Using modified Seldinger technique, a 10 French  drain was placed into the gallbladder fossa, with aspiration of the sample for the lab. Contrast injection confirmed position of the tube within the gallbladder lumen. Drainage catheter was attached to gravity drain with a suture retention placed. Patient tolerated the procedure well and remained hemodynamically stable throughout. No complications were encountered and no significant blood loss encountered. IMPRESSION: Status post percutaneous cholecystostomy. Signed, Dulcy Fanny. Dellia Nims, RPVI [facility] Radiology Electronically Signed   By: Corrie Mckusick D.O.   On: 05/02/2020 11:20   IR US Guidance  Result Date: 05/02/2020 INDICATION: 87 year old male referred for percutaneous cholecystostomy EXAM: CHOLECYSTOSTOMY; IR ULTRASOUND GUIDANCE MEDICATIONS: None ANESTHESIA/SEDATION: Moderate (conscious) sedation was employed during this procedure. A total of Versed 1.0 mg and Fentanyl 25 mcg was administered intravenously. Moderate Sedation Time: 12 minutes. The patient's level of consciousness and vital signs were monitored continuously by radiology nursing throughout the procedure under my direct supervision. FLUOROSCOPY TIME:  Fluoroscopy Time: 0 minutes 6 seconds (7 mGy). COMPLICATIONS: None PROCEDURE: Informed written consent was obtained from the patient and the patient's family after a thorough discussion of the procedural risks, benefits and alternatives. All questions were addressed. Maximal Sterile Barrier Technique was  utilized including caps, mask, sterile gowns, sterile gloves, sterile drape, hand hygiene and skin antiseptic. A timeout was performed prior to the initiation of the procedure. Ultrasound survey of the right upper quadrant was performed for planning purposes. Once the patient is prepped and draped in the usual sterile fashion, the skin and subcutaneous tissues overlying the gallbladder were generously infiltrated 1% lidocaine for local  anesthesia. A coaxial needle was advanced under ultrasound guidance through the skin subcutaneous tissues and a small segment of liver into the gallbladder lumen. With removal of the stylet, spontaneous dark bile drainage occurred. Using modified Seldinger technique, a 10 French drain was placed into the gallbladder fossa, with aspiration of the sample for the lab. Contrast injection confirmed position of the tube within the gallbladder lumen. Drainage catheter was attached to gravity drain with a suture retention placed. Patient tolerated the procedure well and remained hemodynamically stable throughout. No complications were encountered and no significant blood loss encountered. IMPRESSION: Status post percutaneous cholecystostomy. Signed, Yvone Neu. Reyne Dumas, RPVI [facility] Radiology Electronically Signed   By: Gilmer Mor D.O.   On: 05/02/2020 11:20   ECHOCARDIOGRAM COMPLETE  Result Date: 05/02/2020    ECHOCARDIOGRAM REPORT   Patient Name:   TRESTIN VENCES Date of Exam: 05/02/2020 Medical Rec #:  [id number]    Height:       72.0 in Accession #:    [id number]   Weight:       198.0 lb Date of Birth:  [birth year]    BSA:          2.121 m Patient Age:    86 years     BP:           100/61 mmHg Patient Gender: M            HR:           79 bpm. Exam Location:  Inpatient Procedure: 2D Echo Indications:    NSTEMI  History:        Patient has prior history of Echocardiogram examinations, most                 recent 04/26/2018. CAD, chronic kidney disease; Risk                 Factors:Dyslipidemia.  Sonographer:    Delcie Roch Referring Phys: [id number] MAHESH A CHANDRASEKHAR IMPRESSIONS  1. Left ventricular ejection fraction, by estimation, is 40 to 45%. The left ventricle has mildly decreased function. The left ventricle demonstrates regional wall motion abnormalities (see scoring diagram/findings for description). Left ventricular diastolic parameters are  indeterminate.  2. Right ventricular systolic function is mildly reduced. The right ventricular size is mildly enlarged. There is mildly elevated pulmonary artery systolic pressure. The IVC is not well seen, but using an estimated RA pressure of 8, the estimated right ventricular systolic pressure is 40.5 mmHg.  3. Left atrial size was moderately dilated.  4. Mitral regurgitation is anteriorly directed and eccentric, and may be underestimated. There is a small flail segment best seen in parasternal long axis views. . The mitral valve is degenerative. Mild to moderate mitral valve regurgitation. No evidence of mitral stenosis.  5. Tricuspid valve regurgitation is severe.  6. The aortic valve is abnormal. There is moderate calcification of the aortic valve. Aortic valve regurgitation is mild to moderate. No aortic stenosis is present. FINDINGS  Left Ventricle: Left ventricular ejection fraction, by estimation, is 40 to 45%. The left ventricle has mildly  decreased function. The left ventricle demonstrates regional wall motion abnormalities. The left ventricular internal cavity size was normal in size. There is no left ventricular hypertrophy. Left ventricular diastolic parameters are indeterminate.  LV Wall Scoring: The mid and distal lateral wall and mid anterolateral segment are hypokinetic. Right Ventricle: The right ventricular size is mildly enlarged. Right vetricular wall thickness was not well visualized. Right ventricular systolic function is mildly reduced. There is mildly elevated pulmonary artery systolic pressure. The tricuspid regurgitant velocity is 2.85 m/s, and with an assumed right atrial pressure of 8 mmHg, the estimated right ventricular systolic pressure is 56.8 mmHg. Left Atrium: Left atrial size was moderately dilated. Right Atrium: Right atrial size was normal in size. Pericardium: Trivial pericardial effusion is present. Mitral Valve: Mitral regurgitation is anteriorly directed and eccentric,  and may be underestimated. There is a small flail segment best seen in parasternal long axis views. The mitral valve is degenerative in appearance. Mild to moderate mitral valve regurgitation. No evidence of mitral valve stenosis. Tricuspid Valve: The tricuspid valve is normal in structure. Tricuspid valve regurgitation is severe. Aortic Valve: Vena contracta 0.4 cm. The aortic valve is abnormal. There is moderate calcification of the aortic valve. Aortic valve regurgitation is mild to moderate. Aortic regurgitation PHT measures 231 msec. No aortic stenosis is present. Pulmonic Valve: The pulmonic valve was grossly normal. Pulmonic valve regurgitation is trivial. Aorta: The aortic root is normal in size and structure. Venous: The inferior vena cava was not well visualized. IAS/Shunts: The interatrial septum was not well visualized.  LEFT VENTRICLE PLAX 2D LVIDd:         5.20 cm     Diastology LVIDs:         4.10 cm     LV e' medial:    8.59 cm/s LV PW:         1.20 cm     LV E/e' medial:  8.6 LV IVS:        1.00 cm     LV e' lateral:   8.27 cm/s LVOT diam:     2.30 cm     LV E/e' lateral: 8.9 LV SV:         76 LV SV Index:   36 LVOT Area:     4.15 cm  LV Volumes (MOD) LV vol d, MOD A4C: 74.2 ml LV vol s, MOD A4C: 44.4 ml LV SV MOD A4C:     74.2 ml RIGHT VENTRICLE RV S prime:     10.80 cm/s TAPSE (M-mode): 1.3 cm LEFT ATRIUM             Index       RIGHT ATRIUM           Index LA diam:        4.60 cm 2.17 cm/m  RA Area:     16.50 cm LA Vol (A2C):   98.7 ml 46.52 ml/m RA Volume:   44.50 ml  20.98 ml/m LA Vol (A4C):   73.4 ml 34.60 ml/m LA Biplane Vol: 88.8 ml 41.86 ml/m  AORTIC VALVE LVOT Vmax:   76.70 cm/s LVOT Vmean:  49.300 cm/s LVOT VTI:    0.183 m AI PHT:      231 msec  AORTA Ao Root diam: 3.50 cm MITRAL VALVE               TRICUSPID VALVE MV Area (PHT): 5.13 cm    TR Peak grad:   32.5 mmHg MV Decel Time:  148 msec    TR Vmax:        285.00 cm/s MV E velocity: 73.80 cm/s MV A velocity: 58.00 cm/s  SHUNTS  MV E/A ratio:  1.27        Systemic VTI:  0.18 m                            Systemic Diam: 2.30 cm Cherlynn Kaiser MD Electronically signed by Cherlynn Kaiser MD Signature Date/Time: 05/02/2020/4:19:00 PM    Final     Labs:  CBC: Recent Labs    05/02/20 0112 05/03/20 0107 05/04/20 0137 05/05/20 0129  WBC 6.3 26.0* 16.1* 16.4*  HGB 12.5* 10.9* 10.8* 11.6*  HCT 37.3* 32.7* 31.0* 33.3*  PLT 171 131* 130* 135*    COAGS: Recent Labs    05/02/20 2028 05/03/20 1325 05/04/20 0137  APTT 67* 59* 59*    BMP: Recent Labs    06/26/19 0811 07/03/19 0821 05/01/20 0156 05/02/20 0112 05/03/20 0107 05/04/20 0137 05/05/20 0129  NA 142 142   < > 141 140 136 138  K 4.0 4.3   < > 3.7 3.7 3.3* 3.7  CL 105 112*   < > 108 107 103 105  CO2 20 18*   < > 18* 19* 23 22  GLUCOSE 100* 75   < > 154* 170* 250* 177*  BUN 51* 37*   < > 45* 65* 70* 61*  CALCIUM 9.2 8.6   < > 8.3* 7.5* 7.0* 7.3*  CREATININE 2.93* 2.38*   < > 3.13* 4.11* 3.67* 3.09*  GFRNONAA 19* 24*   < > 19* 14* 16* 19*  GFRAA 22* 28*  --   --   --   --   --    < > = values in this interval not displayed.    LIVER FUNCTION TESTS: Recent Labs    05/02/20 0112 05/03/20 0107 05/04/20 0137 05/05/20 0129  BILITOT 6.0* 3.2* 2.1* 2.4*  AST 364* 617* 310* 178*  ALT 191* 321* 256* 221*  ALKPHOS 129* 15* 30* 37*  PROT 6.1* 5.6* 5.3* 5.9*  ALBUMIN 2.7* 2.4* 2.1* 2.3*    Assessment and Plan: Cholelithiasis S/p percutaneous cholecystostomy by Dr. Earleen Newport on 05/02/20. Second percutaneous cholecystostomy- also had drain placed in 01/2015.  Now with recurrence requiring tube replacement.  Per surgery note, patient not a candidate for surgery at this time.  Discussed with patient and family.  Will plan for routine exchange in 8-12 weeks. Outpatient orders placed.   Continue routine drain care with flushes q shift and empty and record output.  Electronically Signed: Docia Barrier, PA 05/05/2020, 2:11 PM   I spent a  total of 15 Minutes at the the patient's bedside AND on the patient's hospital floor or unit, greater than 50% of which was counseling/coordinating care for cholelithiasis.

## 2020-05-06 ENCOUNTER — Other Ambulatory Visit: Payer: Self-pay | Admitting: Physician Assistant

## 2020-05-06 DIAGNOSIS — N184 Chronic kidney disease, stage 4 (severe): Secondary | ICD-10-CM

## 2020-05-06 LAB — CBC
HCT: 36.5 % — ABNORMAL LOW (ref 39.0–52.0)
Hemoglobin: 12.3 g/dL — ABNORMAL LOW (ref 13.0–17.0)
MCH: 29.9 pg (ref 26.0–34.0)
MCHC: 33.7 g/dL (ref 30.0–36.0)
MCV: 88.6 fL (ref 80.0–100.0)
Platelets: 172 10*3/uL (ref 150–400)
RBC: 4.12 MIL/uL — ABNORMAL LOW (ref 4.22–5.81)
RDW: 14.2 % (ref 11.5–15.5)
WBC: 15.2 10*3/uL — ABNORMAL HIGH (ref 4.0–10.5)
nRBC: 0.5 % — ABNORMAL HIGH (ref 0.0–0.2)

## 2020-05-06 LAB — COMPREHENSIVE METABOLIC PANEL
ALT: 175 U/L — ABNORMAL HIGH (ref 0–44)
AST: 108 U/L — ABNORMAL HIGH (ref 15–41)
Albumin: 2.5 g/dL — ABNORMAL LOW (ref 3.5–5.0)
Alkaline Phosphatase: 48 U/L (ref 38–126)
Anion gap: 8 (ref 5–15)
BUN: 50 mg/dL — ABNORMAL HIGH (ref 8–23)
CO2: 24 mmol/L (ref 22–32)
Calcium: 8.1 mg/dL — ABNORMAL LOW (ref 8.9–10.3)
Chloride: 106 mmol/L (ref 98–111)
Creatinine, Ser: 2.78 mg/dL — ABNORMAL HIGH (ref 0.61–1.24)
GFR, Estimated: 22 mL/min — ABNORMAL LOW (ref >60)
Glucose, Bld: 136 mg/dL — ABNORMAL HIGH (ref 70–99)
Potassium: 4.1 mmol/L (ref 3.5–5.1)
Sodium: 138 mmol/L (ref 135–145)
Total Bilirubin: 2 mg/dL — ABNORMAL HIGH (ref 0.3–1.2)
Total Protein: 6.2 g/dL — ABNORMAL LOW (ref 6.5–8.1)

## 2020-05-06 LAB — GLUCOSE, CAPILLARY
Glucose-Capillary: 162 mg/dL — ABNORMAL HIGH (ref 70–99)
Glucose-Capillary: 228 mg/dL — ABNORMAL HIGH (ref 70–99)
Glucose-Capillary: 195 mg/dL — ABNORMAL HIGH (ref 70–99)
Glucose-Capillary: 162 mg/dL — ABNORMAL HIGH (ref 70–99)

## 2020-05-06 LAB — MAGNESIUM: Magnesium: 1.9 mg/dL (ref 1.7–2.4)

## 2020-05-06 MED ORDER — INSULIN ASPART 100 UNIT/ML ~~LOC~~ SOLN: 0.0000 [IU] | Freq: Three times a day (TID) | SUBCUTANEOUS | Status: DC

## 2020-05-06 MED ORDER — HYDRALAZINE HCL 25 MG PO TABS
25.0000 mg | ORAL_TABLET | Freq: Three times a day (TID) | ORAL | Status: DC
  Administered 2020-05-06 – 2020-05-10 (×14): 25 mg via ORAL
  Filled 2020-05-06 (×14): qty 1

## 2020-05-06 MED ORDER — INSULIN ASPART 100 UNIT/ML ~~LOC~~ SOLN
0.0000 [IU] | Freq: Three times a day (TID) | SUBCUTANEOUS | Status: DC
  Administered 2020-05-06: 1 [IU] via SUBCUTANEOUS
  Administered 2020-05-07: 4 [IU] via SUBCUTANEOUS
  Administered 2020-05-07: 1 [IU] via SUBCUTANEOUS

## 2020-05-06 MED ORDER — TAMSULOSIN HCL 0.4 MG PO CAPS
0.4000 mg | ORAL_CAPSULE | Freq: Every day | ORAL | Status: DC
Start: 2020-05-06 — End: 2020-05-12
  Administered 2020-05-06 – 2020-05-11 (×6): 0.4 mg via ORAL
  Filled 2020-05-06 (×6): qty 1

## 2020-05-06 MED ORDER — AMOXICILLIN-POT CLAVULANATE 500-125 MG PO TABS
500.0000 mg | ORAL_TABLET | Freq: Two times a day (BID) | ORAL | Status: DC
  Administered 2020-05-06 – 2020-05-12 (×12): 500 mg via ORAL
  Filled 2020-05-06 (×13): qty 1

## 2020-05-06 NOTE — Progress Notes (Signed)
Progress Note  Patient Name: Nathan [name] Date of Encounter: 05/06/2020  Primary Cardiologist: [name], MD  Subjective   Feeling better after breakfast.   Inpatient Medications    Scheduled Meds: . apixaban  2.5 mg Oral BID  . aspirin EC  81 mg Oral Daily  . furosemide  40 mg Oral Daily  . insulin aspart  0-6 Units Subcutaneous Q4H  . metoprolol succinate  12.5 mg Oral Daily  . potassium chloride  40 mEq Oral Daily  . sodium chloride flush  5 mL Intracatheter Q8H   Continuous Infusions: . sodium chloride Stopped (05/03/20 0328)  . piperacillin-tazobactam (ZOSYN)  IV 2.25 g (05/06/20 0535)   PRN Meds: sodium chloride, acetaminophen **OR** acetaminophen, albuterol, hydrALAZINE, HYDROcodone-acetaminophen, menthol-cetylpyridinium, metoprolol tartrate, ondansetron **OR** [DISCONTINUED] ondansetron (ZOFRAN) IV, phenol   Vital Signs    Vitals:   05/05/20 1925 05/05/20 2319 05/06/20 0432 05/06/20 0805  BP: (!) 155/71 (!) 161/87 (!) 175/68 (!) 173/88  Pulse: 63 64 63 72  Resp: 20 20 20 20   Temp: 97.9 F (36.6 C) 98.2 F (36.8 C) 97.7 F (36.5 C) 98.2 F (36.8 C)  TempSrc: Oral Oral Oral Oral  SpO2: 95% 94% 95% 94%  Weight:   86 kg   Height:        Intake/Output Summary (Last 24 hours) at 05/06/2020 0903 Last data filed at 05/06/2020 0630 Gross per 24 hour  Intake 410 ml  Output 3675 ml  Net -3265 ml   Filed Weights   05/01/20 0228 05/06/20 0432  Weight: 89.8 kg 86 kg    Telemetry    Sinus rhythm, first degree AVB, one ectopic beat.  Personally reviewed.  ECG    An ECG dated 05/04/20 was personally reviewed today and demonstrated:  Sinus rhythm with first deg AVB, right bundle branch block and left anterior fascicular block.  Physical Exam   GEN: No acute distress.   Neck: No JVD Cardiac: RRR, no murmurs, rubs, or gallops.  Respiratory: Clear to auscultation bilaterally. GI: Soft, nontender, non-distended MS: No edema; No deformity. Neuro:   Nonfocal  Psych: Normal affect   Labs    Chemistry Recent Labs  Lab 05/04/20 0137 05/05/20 0129 05/06/20 0126  NA 136 138 138  K 3.3* 3.7 4.1  CL 103 105 106  CO2 23 22 24   GLUCOSE 250* 177* 136*  BUN 70* 61* 50*  CREATININE 3.67* 3.09* 2.78*  CALCIUM 7.0* 7.3* 8.1*  PROT 5.3* 5.9* 6.2*  ALBUMIN 2.1* 2.3* 2.5*  AST 310* 178* 108*  ALT 256* 221* 175*  ALKPHOS 30* 37* 48  BILITOT 2.1* 2.4* 2.0*  GFRNONAA 16* 19* 22*  ANIONGAP 10 11 8      Hematology Recent Labs  Lab 05/04/20 0137 05/05/20 0129 05/06/20 0126  WBC 16.1* 16.4* 15.2*  RBC 3.53* 3.77* 4.12*  HGB 10.8* 11.6* 12.3*  HCT 31.0* 33.3* 36.5*  MCV 87.8 88.3 88.6  MCH 30.6 30.8 29.9  MCHC 34.8 34.8 33.7  RDW 14.4 14.4 14.2  PLT 130* 135* 172    Cardiac Enzymes Recent Labs  Lab 05/01/20 0156 05/01/20 0431 05/01/20 1019 05/01/20 1353 05/01/20 1604  TROPONINIHS 28* 24* 305* 2,988* 5,012*    Radiology    No results found.  Patient Profile     87 y.o. male with a history of CAD status post CABG, hypertension, hyperlipidemia, type 2 diabetes mellitus, CKD stage IV, and chronic combined heart failure now presenting with cholecystitis and demand ischemia.  Assessment & Plan  1.  Demand ischemia - in the setting of physiologic stressors.  High-sensitivity troponin I up to 5012.  No active chest pain.  ECG shows right bundle branch block and left anterior fascicular block.  Currently on aspirin, transitioned back to Eliquis.  2.  Ischemic cardiomyopathy with LVEF 40 to 45%.  Heart Failure Therapy ACE-I/ARB/ARNI: cannot use with renal function as is BB: started on metoprolol succinate 12.5 mg daily KDX:IPJA currently due to renal function. SGLT2I: none currently due to renal function.  Diuretic plan: currently on lasix 40 mg daily po. He is 1.9 L positive for admission. 2.6 L UOP yesterday. Continue lasix. Volume in is coming from infusions with abx.  3.  Valvular heart disease including mild to  moderate mitral regurgitation (may be underestimated due to small flail segment) and mild to moderate aortic regurgitation.  4.  Multivessel CAD status post CABG.  Graft disease documented in 2020 including an occluded SVG to the diagonal and SVG to the OM.  SVG to PDA and sequentially PLA was found to be patent as was the LIMA to LAD.  He has been managed medically. Continue aspirin. - continues on heparin - from a cardiac standpoint, he has completed 48 hours of heparin s/p demand ischemia/NSTEMI. When safe from surgical standpoint can transition back to eliquis (this is for DVT indication). - hold statin for now with LFT abnormalities, resume at follow up outpatient if normalized.  5.  CKD stage IV-V, current creatinine 2.78.  Cardiac catheterization not being pursued at this point in light of very high risk of contrast nephropathy and potential need for hemodialysis.  6.  PSVT, rhythm has been stable by telemetry, none in 24 hours.  7. htn - elevated BP today in 250N systolic. Would resume home hydralazine and imdur.    Signed, Elouise Munroe, MD  05/06/2020, 9:03 AM

## 2020-05-06 NOTE — Progress Notes (Signed)
PROGRESS NOTE   Green Quincy  PYK:[id number] DOB: 1934/01/14 DOA: 05/01/2020 PCP: Leanna Battles, MD  Brief Narrative:  82 white male  CABG X5 2002 nuke stress 2017 and ICM EF 40-45% 04/06/2018 L LE DVT 09/19/2017 Prior cholecystitis (deferred lap chole)-Normal HIDA 2019 CKD 4 baseline HLD DM TY 2  [facility] ED 3/16 abdominal pain S OB, nausea/vomit Transaminitis 200 range bili 2.0 CT = GB stone RUQ USG = sludge - cholecystitis choledocholithiasis  General surgery, GI, cardiology, critical care all consulted on admission Patient confirmed DNR at bedside  Patient has made slow gradual improvement Main issue right now is azotemia which is slowly resolving he has been counseled regarding getting a Foley catheter and I expect he can be discharged towards the end of the week   Hospital-Problem based course  Severe gram-negative sepsis (on cholecystotomy culture) on admission with multiorgan dysfunction syndrome secondary to acute cholecystitis Much improved-saline lock IV-sepsis physiology has resolved-he is now hypertensive Patient confirmed to be no CODE BLUE Poor lap chole candidate-IR coordinating 8 weekly drain exchanges  We will transition to Augmentin 3/22 Expected transaminases to continue to drop slowly--alk phos indicative  nonobstructive pattern-nonoperative candidate-MRCP low yield and can be discussed as outpatient NSTEMI on admission troponins now 5000-likely demand ischemia Underlying CAD CABG X5 EF 45% this admit mild decrease in function Heparin DC 3/21 (received for over 48 hours)-continue asa 81 Continue IV metoprolol every 2 as needed pressure over 180 Intolerant initially bisoprolol bradycardic, hypotensive Current meds = metoprolol 12.5 daily XL-sinus rhythm currently-adding hydralazine 25 every 8 Keep volumes even DVT 2019 Eliquis resumed 3/21-would keep on anticoagulation as he will be relatively immobile for the next several weeks to months Worsening  ATN 2/2 sepsis physiology most likely superimposed on CKD 4 fractional excretion of sodium = 0.9  =  prerenal component-resolving with treatment of sepsis US kidney no hydro Obstructive uropathy Start Flomax Patient had Foley catheter in until 3/21 but is still having retention I have discussed extensively with him and his daughter but patient refuses repeat catheterization I have put an indwelling Foley orders and will leave this up to patient to make that decision-he may not recover renal function if he develops acute obstruction DM TY 2-diet controlled? Strict glycemic control sliding scale ordered-CBGs 1 62-2 28 Do not add long-acting at this time as he is not really eating much  DVT prophylaxis: IV heparin therapeutic dosing Code Status: DNR confirmed Family Communication: Long discussion at the bedside with daughter on several days She understands the tenuous nature of his care   Disposition:  Status is: Inpatient  Remains inpatient appropriate because:Ongoing active pain requiring inpatient pain management, Altered mental status, Ongoing diagnostic testing needed not appropriate for outpatient work up and Inpatient level of care appropriate due to severity of illness   Dispo: The patient is from: Home              Anticipated d/c is to: Home with home health likely with 3 and 1 PT and OT as well as RN to help manage drain and Foley catheter-all ordered              Patient currently is not medically stable to d/c.   Difficult to place patient No  Consultants:   Multiple as above  Procedures: Cholecystotomy 3/18  Antimicrobials: Vancomycin Zosyn---> Augmentin 3/22   Subjective:  Sleepy has not been sleeping well Poor urinary stream witnessed at bedside-educated strongly about Foley catheter patient reluctant Discussed with daughter  who understands risk of worsening kidney function No chest pain no fever no chills no nausea no vomiting   Objective: Vitals:    05/05/20 2319 05/06/20 0432 05/06/20 0805 05/06/20 1108  BP: (!) 161/87 (!) 175/68 (!) 173/88 (!) 167/84  Pulse: 64 63 72 77  Resp: 20 20 20 20   Temp: 98.2 F (36.8 C) 97.7 F (36.5 C) 98.2 F (36.8 C) (!) 97.5 F (36.4 C)  TempSrc: Oral Oral Oral Tympanic  SpO2: 94% 95% 94% 91%  Weight:  86 kg    Height:        Intake/Output Summary (Last 24 hours) at 05/06/2020 1607 Last data filed at 05/06/2020 1436 Gross per 24 hour  Intake 403 ml  Output 3525 ml  Net -3122 ml   Filed Weights   05/01/20 0228 05/06/20 0432  Weight: 89.8 kg 86 kg    Examination:  EOMI NCAT awake alert coherent on nasal cannula Chest clear no rales no rhonchi Cholecystotomy drain still draining copious amounts of fluid Abdomen soft no rebound No lower extremity edema S1-S2 no murmur no rub no gallop Neurologically intact   Data Reviewed: personally reviewed   Data BUNs/creatinine 65/4.11--> 50/2.7 and continues to trend downwards Magnesium 0.8-->1.9 AST/ALT 617/321-->178/221-->108/175 WBC 16.4-->15.2 Hemoglobin 12.3 platelet 135 1722  Radiology Studies: No results found.   Scheduled Meds: . amoxicillin-clavulanate  500 mg Oral Q12H  . apixaban  2.5 mg Oral BID  . aspirin EC  81 mg Oral Daily  . furosemide  40 mg Oral Daily  . hydrALAZINE  25 mg Oral Q8H  . insulin aspart  0-6 Units Subcutaneous Q4H  . metoprolol succinate  12.5 mg Oral Daily  . potassium chloride  40 mEq Oral Daily  . sodium chloride flush  5 mL Intracatheter Q8H  . tamsulosin  0.4 mg Oral QPC supper   Continuous Infusions: . sodium chloride Stopped (05/03/20 0328)  . piperacillin-tazobactam (ZOSYN)  IV 2.25 g (05/06/20 1430)     LOS: 5 days   Time spent: 40  Nita Sells, MD [facility] To contact the attending provider between 7A-7P or the covering provider during after hours 7P-7A, please log into the web site [email or web address] and access using universal [place]ie password for that web site. If  you do not have the password, please call the hospital operator.  05/06/2020, 4:07 PM

## 2020-05-06 NOTE — Progress Notes (Signed)
SATURATION QUALIFICATIONS: (This note is used to comply with regulatory documentation for home oxygen)  Patient Saturations on Room Air at Rest = 91%  Patient Saturations on Room Air while Ambulating = 87%  Patient Saturations on 2 Liters of oxygen while Ambulating = 93%  Please briefly explain why patient needs home oxygen:Pt with desaturation to 87% on RA with gait and requires supplemental oxygen to maintain >90%.  [place] Pager: [phone] Office: [phone]

## 2020-05-06 NOTE — Plan of Care (Signed)

## 2020-05-06 NOTE — Discharge Instructions (Signed)
Information on my medicine - ELIQUIS (apixaban)  Why was Eliquis prescribed for you? Eliquis was prescribed to treat blood clots that may have been found in the veins of your legs (deep vein thrombosis) and to reduce the risk of them occurring again.  What do You need to know about Eliquis ? The dose is ONE 2.5 mg tablet taken TWICE daily.  Eliquis may be taken with or without food.   Try to take the dose about the same time in the morning and in the evening. If you have difficulty swallowing the tablet whole please discuss with your pharmacist how to take the medication safely.  Take Eliquis exactly as prescribed and DO NOT stop taking Eliquis without talking to the doctor who prescribed the medication.  Stopping may increase your risk of developing a new blood clot.  Refill your prescription before you run out.  After discharge, you should have regular check-up appointments with your healthcare provider that is prescribing your Eliquis.    What do you do if you miss a dose? If a dose of ELIQUIS is not taken at the scheduled time, take it as soon as possible on the same day and twice-daily administration should be resumed. The dose should not be doubled to make up for a missed dose.  Important Safety Information A possible side effect of Eliquis is bleeding. You should call your healthcare provider right away if you experience any of the following: ? Bleeding from an injury or your nose that does not stop. ? Unusual colored urine (red or dark brown) or unusual colored stools (red or black). ? Unusual bruising for unknown reasons. ? A serious fall or if you hit your head (even if there is no bleeding).  Some medicines may interact with Eliquis and might increase your risk of bleeding or clotting while on Eliquis. To help avoid this, consult your healthcare provider or pharmacist prior to using any new prescription or non-prescription medications, including herbals, vitamins,  non-steroidal anti-inflammatory drugs (NSAIDs) and supplements.  This website has more information on Eliquis (apixaban): http://[email or web address].com/eliquis/home

## 2020-05-06 NOTE — Progress Notes (Signed)
Physical Therapy Treatment Patient Details Name: Nathan [name] MRN: [id number] DOB: 1933/05/28 Today's Date: 05/06/2020    History of Present Illness Pt is an 87 y/o male admitted secondary to increased abdominal pain on 3/17. Found to have cholelithiasis and is s/p percutaneous cholecystostomy on 3/18. Pt also found to have NSTEMI. PMH includes DM, HTN, CHF, CKD, and CAD s/p CABG.    PT Comments    Pt very pleasant with significant improvement in mobility. Pt able to walk 400' with guarding for balance with decreased speed and need for supplemental O2. Pt educated for use of RW for safety and initial assist at home which daughter (present end of session ) reports she can provide. If pt goes to his house 2 steps to enter and ramp at daughter's house. Pt educated for HEP and continued progression of mobility.   SpO2 91% on RA at rest, drop to 87% on RA with gait 93% on 2L with gait 95% on 2L at rest    Follow Up Recommendations  Home health PT;Supervision for mobility/OOB     Equipment Recommendations  Rolling walker with 5" wheels;3in1 (PT)    Recommendations for Other Services       Precautions / Restrictions Precautions Precautions: Fall Precaution Comments: percutaneous drain on R. Restrictions Weight Bearing Restrictions: No    Mobility  Bed Mobility Overal bed mobility: Modified Independent                  Transfers Overall transfer level: Modified independent                  Ambulation/Gait Ambulation/Gait assistance: Min guard Gait Distance (Feet): 400 Feet Assistive device: None Gait Pattern/deviations: Step-through pattern;Decreased stride length   Gait velocity interpretation: 1.31 - 2.62 ft/sec, indicative of limited community ambulator General Gait Details: pt with several periods of unsteady gait with tactile assist to correct balance. pt refused RW with initial gait but educated for need to use with moving in room or with nursing  staff   Stairs             Wheelchair Mobility    Modified Rankin (Stroke Patients Only)       Balance Overall balance assessment: Needs assistance   Sitting balance-[name]: Fair Sitting balance - Comments: EOb without support     Standing balance-[name]: Good Standing balance comment: pt able to walk without support with noted imbalance                            Cognition Arousal/Alertness: Awake/alert Behavior During Therapy: WFL for tasks assessed/performed Overall Cognitive Status: Within Functional Limits for tasks assessed                                        Exercises General Exercises - Lower Extremity Long Arc Quad: AROM;Both;15 reps;Seated Hip Flexion/Marching: AROM;Both;15 reps;Seated    General Comments        Pertinent Vitals/Pain Pain Assessment: No/denies pain    Home Living                      Prior Function            PT Goals (current goals can now be found in the care plan section) Progress towards PT goals: Goals met and updated - see care plan  Frequency    Min 3X/week      PT Plan Discharge plan needs to be updated    Co-evaluation              AM-PAC PT "6 Clicks" Mobility   Outcome Measure  Help needed turning from your back to your side while in a flat bed without using bedrails?: None Help needed moving from lying on your back to sitting on the side of a flat bed without using bedrails?: None Help needed moving to and from a bed to a chair (including a wheelchair)?: A Little Help needed standing up from a chair using your arms (e.g., wheelchair or bedside chair)?: A Little Help needed to walk in hospital room?: A Little Help needed climbing 3-5 steps with a railing? : A Little 6 Click Score: 20    End of Session Equipment Utilized During Treatment: Oxygen Activity Tolerance: Patient tolerated treatment well Patient left: in chair;with call bell/phone  within reach;with family/visitor present Nurse Communication: Mobility status PT Visit Diagnosis: Muscle weakness (generalized) (M62.81);Difficulty in walking, not elsewhere classified (R26.2);Other abnormalities of gait and mobility (R26.89)     Time: 0921-0950 PT Time Calculation (min) (ACUTE ONLY): 29 min  Charges:  $Gait Training: 8-22 mins $Therapeutic Exercise: 8-22 mins                     Brandii Lakey P, PT Acute Rehabilitation Services Pager: [phone] Office: [place] 05/06/2020, 10:24 AM

## 2020-05-06 NOTE — Progress Notes (Signed)
Pt still has urine retention after urination and frequency.  Spoke to Pt again about placement of the Foley.  He is still reluctant of placement, and feels that it will not come out, and felt that MD told him it will be place forever.  Advised I did not think that was so, it was to help with right know to prevent and improve kidney function. And explained how the increase in bladder volume can put pressure on the kidney and cause damage.  Pt wants to think about placement of the foley and let us know if he wants it or not.

## 2020-05-07 ENCOUNTER — Inpatient Hospital Stay (HOSPITAL_COMMUNITY): Payer: PPO

## 2020-05-07 LAB — COMPREHENSIVE METABOLIC PANEL
ALT: 140 U/L — ABNORMAL HIGH (ref 0–44)
AST: 68 U/L — ABNORMAL HIGH (ref 15–41)
Albumin: 2.7 g/dL — ABNORMAL LOW (ref 3.5–5.0)
Alkaline Phosphatase: 63 U/L (ref 38–126)
Anion gap: 10 (ref 5–15)
BUN: 42 mg/dL — ABNORMAL HIGH (ref 8–23)
CO2: 20 mmol/L — ABNORMAL LOW (ref 22–32)
Calcium: 9 mg/dL (ref 8.9–10.3)
Chloride: 104 mmol/L (ref 98–111)
Creatinine, Ser: 2.53 mg/dL — ABNORMAL HIGH (ref 0.61–1.24)
GFR, Estimated: 24 mL/min — ABNORMAL LOW (ref >60)
Glucose, Bld: 221 mg/dL — ABNORMAL HIGH (ref 70–99)
Potassium: 4.9 mmol/L (ref 3.5–5.1)
Sodium: 134 mmol/L — ABNORMAL LOW (ref 135–145)
Total Bilirubin: 2 mg/dL — ABNORMAL HIGH (ref 0.3–1.2)
Total Protein: 6.9 g/dL (ref 6.5–8.1)

## 2020-05-07 LAB — CBC WITH DIFFERENTIAL/PLATELET
Abs Immature Granulocytes: 0.33 10*3/uL — ABNORMAL HIGH (ref 0.00–0.07)
Basophils Absolute: 0.1 10*3/uL (ref 0.0–0.1)
Basophils Relative: 1 %
Eosinophils Absolute: 0.3 10*3/uL (ref 0.0–0.5)
Eosinophils Relative: 2 %
HCT: 41 % (ref 39.0–52.0)
Hemoglobin: 14.4 g/dL (ref 13.0–17.0)
Immature Granulocytes: 2 %
Lymphocytes Relative: 17 %
Lymphs Abs: 2.6 10*3/uL (ref 0.7–4.0)
MCH: 30.6 pg (ref 26.0–34.0)
MCHC: 35.1 g/dL (ref 30.0–36.0)
MCV: 87.2 fL (ref 80.0–100.0)
Monocytes Absolute: 1.7 10*3/uL — ABNORMAL HIGH (ref 0.1–1.0)
Monocytes Relative: 11 %
Neutro Abs: 10.3 10*3/uL — ABNORMAL HIGH (ref 1.7–7.7)
Neutrophils Relative %: 67 %
Platelets: 181 10*3/uL (ref 150–400)
RBC: 4.7 MIL/uL (ref 4.22–5.81)
RDW: 13.8 % (ref 11.5–15.5)
WBC: 15.3 10*3/uL — ABNORMAL HIGH (ref 4.0–10.5)
nRBC: 0.3 % — ABNORMAL HIGH (ref 0.0–0.2)

## 2020-05-07 LAB — GLUCOSE, CAPILLARY
Glucose-Capillary: 189 mg/dL — ABNORMAL HIGH (ref 70–99)
Glucose-Capillary: 317 mg/dL — ABNORMAL HIGH (ref 70–99)
Glucose-Capillary: 269 mg/dL — ABNORMAL HIGH (ref 70–99)
Glucose-Capillary: 269 mg/dL — ABNORMAL HIGH (ref 70–99)

## 2020-05-07 MED ORDER — MELATONIN 3 MG PO TABS
3.0000 mg | ORAL_TABLET | Freq: Every day | ORAL | Status: DC
  Administered 2020-05-07 – 2020-05-11 (×5): 3 mg via ORAL
  Filled 2020-05-07 (×5): qty 1

## 2020-05-07 MED ORDER — MAGIC MOUTHWASH
5.0000 mL | Freq: Three times a day (TID) | ORAL | Status: DC
  Administered 2020-05-07 – 2020-05-08 (×6): 5 mL via ORAL
  Filled 2020-05-07 (×10): qty 5

## 2020-05-07 MED ORDER — PANTOPRAZOLE SODIUM 40 MG PO TBEC
40.0000 mg | DELAYED_RELEASE_TABLET | Freq: Every day | ORAL | Status: DC
  Administered 2020-05-07 – 2020-05-12 (×6): 40 mg via ORAL
  Filled 2020-05-07 (×6): qty 1

## 2020-05-07 MED ORDER — CAMPHOR-MENTHOL 0.5-0.5 % EX LOTN
TOPICAL_LOTION | CUTANEOUS | Status: DC | PRN
  Filled 2020-05-07: qty 222

## 2020-05-07 MED ORDER — INSULIN ASPART 100 UNIT/ML ~~LOC~~ SOLN
0.0000 [IU] | Freq: Three times a day (TID) | SUBCUTANEOUS | Status: DC
  Administered 2020-05-07: 8 [IU] via SUBCUTANEOUS
  Administered 2020-05-08: 11 [IU] via SUBCUTANEOUS
  Administered 2020-05-08 (×2): 5 [IU] via SUBCUTANEOUS
  Administered 2020-05-09: 11 [IU] via SUBCUTANEOUS
  Administered 2020-05-09: 8 [IU] via SUBCUTANEOUS
  Administered 2020-05-09: 5 [IU] via SUBCUTANEOUS
  Administered 2020-05-10: 8 [IU] via SUBCUTANEOUS
  Administered 2020-05-10 (×2): 5 [IU] via SUBCUTANEOUS
  Administered 2020-05-11 – 2020-05-12 (×4): 3 [IU] via SUBCUTANEOUS

## 2020-05-07 MED ORDER — INSULIN ASPART 100 UNIT/ML ~~LOC~~ SOLN
0.0000 [IU] | Freq: Every day | SUBCUTANEOUS | Status: DC
  Administered 2020-05-07: 3 [IU] via SUBCUTANEOUS
  Administered 2020-05-09: 2 [IU] via SUBCUTANEOUS
  Administered 2020-05-10: 3 [IU] via SUBCUTANEOUS

## 2020-05-07 MED ORDER — ISOSORBIDE MONONITRATE ER 30 MG PO TB24
30.0000 mg | ORAL_TABLET | Freq: Every day | ORAL | Status: DC
  Administered 2020-05-07 – 2020-05-12 (×6): 30 mg via ORAL
  Filled 2020-05-07 (×6): qty 1

## 2020-05-07 NOTE — Progress Notes (Signed)
Progress Note  Patient Name: Nathan [name] Date of Encounter: 05/07/2020  Primary Cardiologist: [name], MD  Subjective   No CP, SOB.  Inpatient Medications    Scheduled Meds: . amoxicillin-clavulanate  500 mg Oral Q12H  . apixaban  2.5 mg Oral BID  . aspirin EC  81 mg Oral Daily  . furosemide  40 mg Oral Daily  . hydrALAZINE  25 mg Oral Q8H  . insulin aspart  0-6 Units Subcutaneous TID PC & HS  . metoprolol succinate  12.5 mg Oral Daily  . potassium chloride  40 mEq Oral Daily  . sodium chloride flush  5 mL Intracatheter Q8H  . tamsulosin  0.4 mg Oral QPC supper   Continuous Infusions: . sodium chloride Stopped (05/03/20 0328)   PRN Meds: sodium chloride, acetaminophen **OR** acetaminophen, albuterol, hydrALAZINE, HYDROcodone-acetaminophen, menthol-cetylpyridinium, metoprolol tartrate, ondansetron **OR** [DISCONTINUED] ondansetron (ZOFRAN) IV, phenol   Vital Signs    Vitals:   05/06/20 1705 05/06/20 2002 05/06/20 2342 05/07/20 0420  BP: (!) 188/82 (!) 158/71 103/78 (!) 155/82  Pulse:  80 81 82  Resp:  20 18 20   Temp:  98.2 F (36.8 C) 98 F (36.7 C) 98.1 F (36.7 C)  TempSrc:  Oral Oral Oral  SpO2:  92% 91% 90%  Weight:    82.7 kg  Height:        Intake/Output Summary (Last 24 hours) at 05/07/2020 0852 Last data filed at 05/07/2020 0500 Gross per 24 hour  Intake 8 ml  Output 3150 ml  Net -3142 ml   Filed Weights   05/01/20 0228 05/06/20 0432 05/07/20 0420  Weight: 89.8 kg 86 kg 82.7 kg    Telemetry    Sinus rhythm, first degree AVB.  Personally reviewed.  ECG    An ECG dated 05/04/20 was personally reviewed today and demonstrated:  Sinus rhythm with first deg AVB, right bundle branch block and left anterior fascicular block.  Physical Exam   GEN: No acute distress.   Neck: No JVD Cardiac: RRR, no murmurs, rubs, or gallops.  Respiratory: Clear to auscultation bilaterally. GI: Soft, nontender, non-distended  MS: No edema; No  deformity. Neuro:  Nonfocal  Psych: Normal affect    Labs    Chemistry Recent Labs  Lab 05/05/20 0129 05/06/20 0126 05/07/20 0043  NA 138 138 134*  K 3.7 4.1 4.9  CL 105 106 104  CO2 22 24 20*  GLUCOSE 177* 136* 221*  BUN 61* 50* 42*  CREATININE 3.09* 2.78* 2.53*  CALCIUM 7.3* 8.1* 9.0  PROT 5.9* 6.2* 6.9  ALBUMIN 2.3* 2.5* 2.7*  AST 178* 108* 68*  ALT 221* 175* 140*  ALKPHOS 37* 48 63  BILITOT 2.4* 2.0* 2.0*  GFRNONAA 19* 22* 24*  ANIONGAP 11 8 10      Hematology Recent Labs  Lab 05/05/20 0129 05/06/20 0126 05/07/20 0043  WBC 16.4* 15.2* 15.3*  RBC 3.77* 4.12* 4.70  HGB 11.6* 12.3* 14.4  HCT 33.3* 36.5* 41.0  MCV 88.3 88.6 87.2  MCH 30.8 29.9 30.6  MCHC 34.8 33.7 35.1  RDW 14.4 14.2 13.8  PLT 135* 172 181    Cardiac Enzymes Recent Labs  Lab 05/01/20 0156 05/01/20 0431 05/01/20 1019 05/01/20 1353 05/01/20 1604  TROPONINIHS 28* 24* 305* 2,988* 5,012*    Radiology    No results found.  Patient Profile     87 y.o. male with a history of CAD status post CABG, hypertension, hyperlipidemia, type 2 diabetes mellitus, CKD stage IV, and chronic  combined heart failure now presenting with cholecystitis and demand ischemia.  Assessment & Plan   1.  Demand ischemia - in the setting of physiologic stressors.  High-sensitivity troponin I up to 5012.  No active chest pain.  ECG shows right bundle branch block and left anterior fascicular block.  Currently on aspirin and Eliquis. Would continue Aspirin 81 mg daily and Eliquis 2.5 mg BID in dual therapy given demand ischemia. Follow for signs of bleeding and follow CBCs as outpatient. Continue ASA for 1 year for medical management of demand ischemia and possible NSTEMI.  2.  Ischemic cardiomyopathy with LVEF 40 to 45%.  Heart Failure Therapy ACE-I/ARB/ARNI: cannot use with renal function as is BB: started on metoprolol succinate 12.5 mg daily GQQ:PYPP currently due to renal function. SGLT2I: none currently due  to renal function.  Diuretic plan: currently on lasix 40 mg daily po, renal function improving. He is -4L urine yesterday with net negative 5L.  3.  Valvular heart disease including mild to moderate mitral regurgitation (may be underestimated due to small flail segment) and mild to moderate aortic regurgitation.  4.  Multivessel CAD status post CABG.  Graft disease documented in 2020 including an occluded SVG to the diagonal and SVG to the OM.  SVG to PDA and sequentially PLA was found to be patent as was the LIMA to LAD.  He has been managed medically. Continue aspirin for at least 1 year if no bleeding. - on eliquis (this is for DVT indication). - hold statin for now with LFT abnormalities, resume at follow up outpatient if normalized.  5.  CKD stage IV-V, current creatinine 2.53, continues to improve.  Cardiac catheterization not being pursued at this point in light of very high risk of contrast nephropathy and potential need for hemodialysis.  6.  PSVT, rhythm has been stable by telemetry, none in 24 hours.  7. htn - elevated BP today in 509T systolic. Would continue home hydralazine and restart imdur.   Signed, Elouise Munroe, MD  05/07/2020, 8:52 AM

## 2020-05-07 NOTE — Progress Notes (Addendum)
Physical Therapy Treatment Patient Details Name: Nathan [name] MRN: [id number] DOB: 05-22-1933 Today's Date: 05/07/2020    History of Present Illness Pt is an 87 y/o male admitted secondary to increased abdominal pain on 3/17. Found to have cholelithiasis and is s/p percutaneous cholecystostomy on 3/18. Pt also found to have NSTEMI. PMH includes DM, HTN, CHF, CKD, and CAD s/p CABG.    PT Comments    Pt admitted with above diagnosis.Pt was able to ambulate with RW with need for Min guard for stability with one LOB needing steadying assist.  Daughter present and educated in how to assist pt and issued gait belt.  Also pt and daughter educated in pursed lip breathing techniques, energy conservation handout given and incentive spirometer practice.  Daughter feels comfortable with pt coming to her house if needed to care for him. Will continue to follow acutely. Pt currently with functional limitations due to balance and endurance deficits. Pt will benefit from skilled PT to increase their independence and safety with mobility to allow discharge to the venue listed below.   SATURATION QUALIFICATIONS: (This note is used to comply with regulatory documentation for home oxygen)  Patient Saturations on Room Air at Rest = 87% - needed 2L to keep sats >90%  Patient Saturations on Room Air while Ambulating = NT as desats on RA at rest  Desat to 84% on 2L of oxygen with ambulating  Patient Saturations on 4 Liters of oxygen while Ambulating = 88%  Please briefly explain why patient needs home oxygen:Pt requiring O2 at rest and incr O2 with activity to maintain sats >90%.   HR 98 bpm initially and up to 123 bpm with activity.  BP initially 146/78 and 147/83 sitting after walk.  See above for O2 sats with activity.   Follow Up Recommendations  Home health PT;Supervision for mobility/OOB     Equipment Recommendations  3in1 (PT);Wheelchair (18x16 lightweight wheelchair with foot rests, desk armrests and  anti tippers);Wheelchair cushion (18x16 pressure relieving cushion);Other (comment) (pulse oximeter, home O2) issued gait belt   Recommendations for Other Services       Precautions / Restrictions Precautions Precautions: Fall Precaution Comments: percutaneous drain on R. Restrictions Weight Bearing Restrictions: No    Mobility  Bed Mobility   Bed Mobility: Supine to Sit;Sit to Supine     Supine to sit: Min assist     General bed mobility comments: Pt reaching for therapist arm to pull on during trunk elevation. Able to scoot hips to EOB position with extra time/effort    Transfers   Equipment used: Rolling walker (2 wheeled) Transfers: Sit to/from Stand Sit to Stand: Min guard;Min assist         General transfer comment: min A to fully boost on initial stand from EOB  Ambulation/Gait Ambulation/Gait assistance: Min guard Gait Distance (Feet): 150 Feet Assistive device: Rolling walker (2 wheeled) Gait Pattern/deviations: Step-through pattern;Decreased stride length;Trunk flexed;Wide base of support;Staggering left   Gait velocity interpretation: 1.31 - 2.62 ft/sec, indicative of limited community ambulator General Gait Details: pt with at least 1 period of unsteady gait with tactile assist to correct balance. Pt moves more safely with  RW.  Cues for pursed lip breathing throughout.   Stairs             Wheelchair Mobility    Modified Rankin (Stroke Patients Only)       Balance Overall balance assessment: Needs assistance Sitting-balance support: No upper extremity supported;Feet supported Sitting balance-[name]: Fair Sitting balance -  Comments: EOb without support   Standing balance support: Bilateral upper extremity supported;During functional activity Standing balance-[name]: Good Standing balance comment: pt able to walk needing external support at least min guard and with RW  with noted imbalance                             Cognition Arousal/Alertness: Awake/alert Behavior During Therapy: WFL for tasks assessed/performed Overall Cognitive Status: Within Functional Limits for tasks assessed                                        Exercises General Exercises - Lower Extremity Long Arc Quad: AROM;Both;15 reps;Seated Hip Flexion/Marching: AROM;Both;15 reps;Seated  Incentive spirometer practice x 10    General Comments        Pertinent Vitals/Pain Pain Assessment: No/denies pain    Home Living                      Prior Function            PT Goals (current goals can now be found in the care plan section) Acute Rehab PT Goals Patient Stated Goal: to feel better Progress towards PT goals: Progressing toward goals    Frequency    Min 3X/week      PT Plan Current plan remains appropriate    Co-evaluation              AM-PAC PT "6 Clicks" Mobility   Outcome Measure  Help needed turning from your back to your side while in a flat bed without using bedrails?: None Help needed moving from lying on your back to sitting on the side of a flat bed without using bedrails?: None Help needed moving to and from a bed to a chair (including a wheelchair)?: A Little Help needed standing up from a chair using your arms (e.g., wheelchair or bedside chair)?: A Little Help needed to walk in hospital room?: A Little Help needed climbing 3-5 steps with a railing? : A Little 6 Click Score: 20    End of Session Equipment Utilized During Treatment: Oxygen;Gait belt Activity Tolerance: Patient tolerated treatment well Patient left: in chair;with call bell/phone within reach;with family/visitor present Nurse Communication: Mobility status PT Visit Diagnosis: Muscle weakness (generalized) (M62.81);Difficulty in walking, not elsewhere classified (R26.2);Other abnormalities of gait and mobility (R26.89)     Time: 6144-3154 PT Time Calculation (min) (ACUTE ONLY): 34  min  Charges:  $Gait Training: 8-22 mins $Self Care/Home Management: 8-22                     [facility] M,PT Acute Rehab Services [phone] (pager)   Alvira Philips 05/07/2020, 2:34 PM

## 2020-05-07 NOTE — Progress Notes (Signed)
Progress Note    Nathan [name]  HBZ:[id number] DOB: 18-Dec-1933  DOA: 05/01/2020 PCP: Leanna Battles, MD    Brief Narrative:    Medical records reviewed and are as summarized below:  Nathan [name] is an 87 y.o. male with a history of CAD status post CABG, hypertension, hyperlipidemia, type 2 diabetes mellitus, CKD stage IV, and chronic combined heart failure now presenting with cholecystitis and demand ischemia.  Assessment/Plan:   Principal Problem:   Abdominal pain Active Problems:   Hypercholesteremia   Diabetes mellitus type 2 with complications (HCC)   Essential hypertension   Chronic combined systolic and diastolic CHF (congestive heart failure) (HCC)   Stage 4 chronic kidney disease (HCC)   Calculus of gallbladder with biliary obstruction but without cholecystitis   Elevated LFTs   Severe gram-negative sepsis (E coli/Kleb pna)) on admission with multiorgan dysfunction syndrome secondary to acute cholecystitis -Much improved-saline lock IV-sepsis physiology has resolved-he is now hypertensive -Poor lap chole candidate-IR: Will plan for routine exchange in 8-12 weeks             -Augmentin 3/22  Acute respiratory failure  -not on at home  -2L at rest and 4L at home (<87% on room air and more work of breathing when O2 removed)  -DG chest  NSTEMI on admission  -likely demand ischemia -cards consult appreciated: ASA plus eliquis  AKI on CKD 4 -baseline appears to be 2-3 (2.4 in 5/21)  DM type 2 -SSI- increased to regular from very sensitive -Held home insulin as PO intake is low    Family Communication/Anticipated D/C date and plan/Code Status   DVT prophylaxis: eliquis Code Status: DNR Disposition Plan: Status is: Inpatient  Remains inpatient appropriate because:Inpatient level of care appropriate due to severity of illness   Dispo: The patient is from: Home              Anticipated d/c is to: Home              Patient currently is not medically  stable to d/c.   Difficult to place patient No     Medical Consultants:   Cards IR Palliative for GOC    Subjective:   C/o not being hungry Has told daughter he thinks this is the end and wants to go "home"   Objective:    Vitals:   05/06/20 2002 05/06/20 2342 05/07/20 0420 05/07/20 1053  BP: (!) 158/71 103/78 (!) 155/82 (!) 147/83  Pulse: 80 81 82 92  Resp: 20 18 20 19   Temp: 98.2 F (36.8 C) 98 F (36.7 C) 98.1 F (36.7 C) 97.6 F (36.4 C)  TempSrc: Oral Oral Oral Oral  SpO2: 92% 91% 90% 91%  Weight:   82.7 kg   Height:        Intake/Output Summary (Last 24 hours) at 05/07/2020 1113 Last data filed at 05/07/2020 1017 Gross per 24 hour  Intake 8 ml  Output 3300 ml  Net -3292 ml   Filed Weights   05/01/20 0228 05/06/20 0432 05/07/20 0420  Weight: 89.8 kg 86 kg 82.7 kg    Exam:  General: Appearance:    Elderly frail appearing  male in no acute distress     Lungs:     respirations unlabored, diminished  Heart:    Normal heart rate. Normal rhythm.    MS:   All extremities are intact.   Neurologic:   Awake, alert    Data Reviewed:   I have personally reviewed  following labs and imaging studies:  Labs: Labs show the following:   Basic Metabolic Panel: Recent Labs  Lab 05/03/20 0107 05/04/20 0137 05/05/20 0129 05/06/20 0126 05/07/20 0043  NA 140 136 138 138 134*  K 3.7 3.3* 3.7 4.1 4.9  CL 107 103 105 106 104  CO2 19* 23 22 24  20*  GLUCOSE 170* 250* 177* 136* 221*  BUN 65* 70* 61* 50* 42*  CREATININE 4.11* 3.67* 3.09* 2.78* 2.53*  CALCIUM 7.5* 7.0* 7.3* 8.1* 9.0  MG 0.8* 1.8 1.9 1.9  --    GFR Estimated Creatinine Clearance: 23.9 mL/min (A) (by C-G formula based on SCr of 2.53 mg/dL (H)). Liver Function Tests: Recent Labs  Lab 05/03/20 0107 05/04/20 0137 05/05/20 0129 05/06/20 0126 05/07/20 0043  AST 617* 310* 178* 108* 68*  ALT 321* 256* 221* 175* 140*  ALKPHOS 15* 30* 37* 48 63  BILITOT 3.2* 2.1* 2.4* 2.0* 2.0*  PROT 5.6*  5.3* 5.9* 6.2* 6.9  ALBUMIN 2.4* 2.1* 2.3* 2.5* 2.7*   Recent Labs  Lab 05/01/20 0304  LIPASE 43   No results for input(s): AMMONIA in the last 168 hours. Coagulation profile No results for input(s): INR, PROTIME in the last 168 hours.  CBC: Recent Labs  Lab 05/03/20 0107 05/04/20 0137 05/05/20 0129 05/06/20 0126 05/07/20 0043  WBC 26.0* 16.1* 16.4* 15.2* 15.3*  NEUTROABS  --   --   --   --  10.3*  HGB 10.9* 10.8* 11.6* 12.3* 14.4  HCT 32.7* 31.0* 33.3* 36.5* 41.0  MCV 91.1 87.8 88.3 88.6 87.2  PLT 131* 130* 135* 172 181   Cardiac Enzymes: No results for input(s): CKTOTAL, CKMB, CKMBINDEX, TROPONINI in the last 168 hours. BNP (last 3 results) No results for input(s): PROBNP in the last 8760 hours. CBG: Recent Labs  Lab 05/06/20 1109 05/06/20 1626 05/06/20 2102 05/07/20 0553 05/07/20 1059  GLUCAP 228* 195* 162* 189* 317*   D-Dimer: No results for input(s): DDIMER in the last 72 hours. Hgb A1c: No results for input(s): HGBA1C in the last 72 hours. Lipid Profile: No results for input(s): CHOL, HDL, LDLCALC, TRIG, CHOLHDL, LDLDIRECT in the last 72 hours. Thyroid function studies: No results for input(s): TSH, T4TOTAL, T3FREE, THYROIDAB in the last 72 hours.  Invalid input(s): FREET3 Anemia work up: No results for input(s): VITAMINB12, FOLATE, FERRITIN, TIBC, IRON, RETICCTPCT in the last 72 hours. Sepsis Labs: Recent Labs  Lab 05/02/20 0813 05/02/20 1136 05/03/20 0107 05/04/20 0137 05/05/20 0129 05/06/20 0126 05/07/20 0043  WBC  --   --    < > 16.1* 16.4* 15.2* 15.3*  LATICACIDVEN 4.8* 5.1*  --   --   --   --   --    < > = values in this interval not displayed.    Microbiology Recent Results (from the past 240 hour(s))  Resp Panel by RT-PCR (Flu A&B, Covid) Nasopharyngeal Swab     Status: None   Collection Time: 05/01/20  4:40 AM   Specimen: Nasopharyngeal Swab; Nasopharyngeal(NP) swabs in vial transport medium  Result Value Ref Range Status   SARS  Coronavirus 2 by RT PCR NEGATIVE NEGATIVE Final    Comment: (NOTE) SARS-CoV-2 target nucleic acids are NOT DETECTED.  The SARS-CoV-2 RNA is generally detectable in upper respiratory specimens during the acute phase of infection. The lowest concentration of SARS-CoV-2 viral copies this assay can detect is 138 copies/mL. A negative result does not preclude SARS-Cov-2 infection and should not be used as the sole basis for  treatment or other patient management decisions. A negative result may occur with  improper specimen collection/handling, submission of specimen other than nasopharyngeal swab, presence of viral mutation(s) within the areas targeted by this assay, and inadequate number of viral copies(<138 copies/mL). A negative result must be combined with clinical observations, patient history, and epidemiological information. The expected result is Negative.  Fact Sheet for Patients:  EntrepreneurPulse.com.au  Fact Sheet for Healthcare Providers:  IncredibleEmployment.be  This test is no t yet approved or cleared by the [place] FDA and  has been authorized for detection and/or diagnosis of SARS-CoV-2 by FDA under an Emergency Use Authorization (EUA). This EUA will remain  in effect (meaning this test can be used) for the duration of the COVID-19 declaration under Section 564(b)(1) of the Act, 21 U.S.C.section 360bbb-3(b)(1), unless the authorization is terminated  or revoked sooner.       Influenza A by PCR NEGATIVE NEGATIVE Final   Influenza B by PCR NEGATIVE NEGATIVE Final    Comment: (NOTE) The Xpert Xpress SARS-CoV-2/FLU/RSV plus assay is intended as an aid in the diagnosis of influenza from Nasopharyngeal swab specimens and should not be used as a sole basis for treatment. Nasal washings and aspirates are unacceptable for Xpert Xpress SARS-CoV-2/FLU/RSV testing.  Fact Sheet for  Patients: EntrepreneurPulse.com.au  Fact Sheet for Healthcare Providers: IncredibleEmployment.be  This test is not yet approved or cleared by the [place] FDA and has been authorized for detection and/or diagnosis of SARS-CoV-2 by FDA under an Emergency Use Authorization (EUA). This EUA will remain in effect (meaning this test can be used) for the duration of the COVID-19 declaration under Section 564(b)(1) of the Act, 21 U.S.C. section 360bbb-3(b)(1), unless the authorization is terminated or revoked.  Performed at [facility], [place] [address] [place], [place] [address]   MRSA PCR Screening     Status: None   Collection Time: 05/01/20  6:56 PM   Specimen: Nasopharyngeal  Result Value Ref Range Status   MRSA by PCR NEGATIVE NEGATIVE Final    Comment:        The GeneXpert MRSA Assay (FDA approved for NASAL specimens only), is one component of a comprehensive MRSA colonization surveillance program. It is not intended to diagnose MRSA infection nor to guide or monitor treatment for MRSA infections. Performed at [facility], [place] [address] [place], [place] [address]   Aerobic/Anaerobic Culture (surgical/deep wound)     Status: None (Preliminary result)   Collection Time: 05/02/20 11:13 AM   Specimen: BILE  Result Value Ref Range Status   Specimen Description BILE  Final   Special Requests NONE  Final   Gram Stain   Final    RARE WBC PRESENT, PREDOMINANTLY PMN ABUNDANT GRAM VARIABLE ROD MODERATE GRAM POSITIVE COCCI Performed at [facility], [place] [address], [place], [place] [address]    Culture   Final    ABUNDANT KLEBSIELLA PNEUMONIAE MODERATE ESCHERICHIA COLI NO ANAEROBES ISOLATED; CULTURE IN PROGRESS FOR 5 DAYS    Report Status PENDING  Incomplete   Organism ID, Bacteria KLEBSIELLA PNEUMONIAE  Final   Organism ID, Bacteria ESCHERICHIA COLI  Final      Susceptibility   Escherichia coli - MIC*     AMPICILLIN <=2 SENSITIVE Sensitive     CEFAZOLIN <=4 SENSITIVE Sensitive     CEFEPIME <=0.12 SENSITIVE Sensitive     CEFTAZIDIME <=1 SENSITIVE Sensitive     CEFTRIAXONE <=0.25 SENSITIVE Sensitive     CIPROFLOXACIN <=0.25  SENSITIVE Sensitive     GENTAMICIN <=1 SENSITIVE Sensitive     IMIPENEM <=0.25 SENSITIVE Sensitive     TRIMETH/SULFA <=20 SENSITIVE Sensitive     AMPICILLIN/SULBACTAM <=2 SENSITIVE Sensitive     PIP/TAZO <=4 SENSITIVE Sensitive     * MODERATE ESCHERICHIA COLI   Klebsiella pneumoniae - MIC*    AMPICILLIN >=32 RESISTANT Resistant     CEFAZOLIN <=4 SENSITIVE Sensitive     CEFEPIME <=0.12 SENSITIVE Sensitive     CEFTAZIDIME <=1 SENSITIVE Sensitive     CEFTRIAXONE <=0.25 SENSITIVE Sensitive     CIPROFLOXACIN <=0.25 SENSITIVE Sensitive     GENTAMICIN <=1 SENSITIVE Sensitive     IMIPENEM <=0.25 SENSITIVE Sensitive     TRIMETH/SULFA <=20 SENSITIVE Sensitive     AMPICILLIN/SULBACTAM 8 SENSITIVE Sensitive     PIP/TAZO <=4 SENSITIVE Sensitive     * ABUNDANT KLEBSIELLA PNEUMONIAE    Procedures and diagnostic studies:  No results found.  Medications:   . amoxicillin-clavulanate  500 mg Oral Q12H  . apixaban  2.5 mg Oral BID  . aspirin EC  81 mg Oral Daily  . furosemide  40 mg Oral Daily  . hydrALAZINE  25 mg Oral Q8H  . insulin aspart  0-6 Units Subcutaneous TID PC & HS  . isosorbide mononitrate  30 mg Oral Daily  . magic mouthwash  5 mL Oral TID  . melatonin  3 mg Oral QHS  . metoprolol succinate  12.5 mg Oral Daily  . potassium chloride  40 mEq Oral Daily  . sodium chloride flush  5 mL Intracatheter Q8H  . tamsulosin  0.4 mg Oral QPC supper   Continuous Infusions: . sodium chloride Stopped (05/03/20 0328)     LOS: 6 days   Geradine Girt  [facility]   How to contact the [facility] Attending or Consulting provider [place] or covering provider during after hours [place], for this patient?  1. Check the care team in [facility] and look for a) attending/consulting  TRH provider listed and b) the [facility] team listed 2. Log into [email or web address] and use [place]'s universal password to access. If you do not have the password, please contact the hospital operator. 3. Locate the [facility] provider you are looking for under [facility] and page to a number that you can be directly reached. 4. If you still have difficulty reaching the provider, please page the [facility] (Director on Call) for the Hospitalists listed on amion for assistance.  05/07/2020, 11:13 AM

## 2020-05-07 NOTE — Progress Notes (Signed)
SATURATION QUALIFICATIONS: (This note is used to comply with regulatory documentation for home oxygen)  Patient Saturations on Room Air at Rest = 87% - needed 2L to keep sats >90%  Patient Saturations on Room Air while Ambulating = NT as desats on RA at rest  Patient Saturations on 4 Liters of oxygen while Ambulating = 88%  Please briefly explain why patient needs home oxygen:Pt requiring O2 at rest and incr O2 with activity to maintain sats >90%.  Dawn M,PT Acute Rehab Services [phone] (pager)

## 2020-05-07 NOTE — Progress Notes (Signed)
OT Cancellation Note  Patient Details Name: Jacksen Isip MRN: [id number] DOB: 03-16-1933   Cancelled Treatment:    Reason Eval/Treat Not Completed: Other (comment). Pt reports he just got back in bed after sitting up in the recliner for awhile, requesting to rest. Plan to reattempt at a later time.  Tyrone Schimke, OT Acute Rehabilitation Services Pager: [phone] Office: [phone]  05/07/2020, 11:12 AM

## 2020-05-08 LAB — COMPREHENSIVE METABOLIC PANEL
ALT: 93 U/L — ABNORMAL HIGH (ref 0–44)
AST: 34 U/L (ref 15–41)
Albumin: 2.5 g/dL — ABNORMAL LOW (ref 3.5–5.0)
Alkaline Phosphatase: 61 U/L (ref 38–126)
Anion gap: 10 (ref 5–15)
BUN: 41 mg/dL — ABNORMAL HIGH (ref 8–23)
CO2: 19 mmol/L — ABNORMAL LOW (ref 22–32)
Calcium: 9.3 mg/dL (ref 8.9–10.3)
Chloride: 106 mmol/L (ref 98–111)
Creatinine, Ser: 2.72 mg/dL — ABNORMAL HIGH (ref 0.61–1.24)
GFR, Estimated: 22 mL/min — ABNORMAL LOW (ref >60)
Glucose, Bld: 217 mg/dL — ABNORMAL HIGH (ref 70–99)
Potassium: 5.1 mmol/L (ref 3.5–5.1)
Sodium: 135 mmol/L (ref 135–145)
Total Bilirubin: 1.8 mg/dL — ABNORMAL HIGH (ref 0.3–1.2)
Total Protein: 6.7 g/dL (ref 6.5–8.1)

## 2020-05-08 LAB — CBC WITH DIFFERENTIAL/PLATELET
Abs Immature Granulocytes: 0.03 10*3/uL (ref 0.00–0.07)
Basophils Absolute: 0 10*3/uL (ref 0.0–0.1)
Basophils Relative: 1 %
Eosinophils Absolute: 0.1 10*3/uL (ref 0.0–0.5)
Eosinophils Relative: 2 %
HCT: 36.2 % — ABNORMAL LOW (ref 39.0–52.0)
Hemoglobin: 12.3 g/dL — ABNORMAL LOW (ref 13.0–17.0)
Immature Granulocytes: 1 %
Lymphocytes Relative: 23 %
Lymphs Abs: 1.5 10*3/uL (ref 0.7–4.0)
MCH: 31.5 pg (ref 26.0–34.0)
MCHC: 34 g/dL (ref 30.0–36.0)
MCV: 92.6 fL (ref 80.0–100.0)
Monocytes Absolute: 0.8 10*3/uL (ref 0.1–1.0)
Monocytes Relative: 12 %
Neutro Abs: 4.1 10*3/uL (ref 1.7–7.7)
Neutrophils Relative %: 61 %
Platelets: 183 10*3/uL (ref 150–400)
RBC: 3.91 MIL/uL — ABNORMAL LOW (ref 4.22–5.81)
RDW: 12.8 % (ref 11.5–15.5)
WBC: 6.6 10*3/uL (ref 4.0–10.5)
nRBC: 0 % (ref 0.0–0.2)

## 2020-05-08 LAB — GLUCOSE, CAPILLARY
Glucose-Capillary: 217 mg/dL — ABNORMAL HIGH (ref 70–99)
Glucose-Capillary: 322 mg/dL — ABNORMAL HIGH (ref 70–99)
Glucose-Capillary: 174 mg/dL — ABNORMAL HIGH (ref 70–99)

## 2020-05-08 MED ORDER — INSULIN ASPART 100 UNIT/ML ~~LOC~~ SOLN: 4.0000 [IU] | Freq: Three times a day (TID) | SUBCUTANEOUS | Status: DC

## 2020-05-08 MED ORDER — INSULIN GLARGINE 100 UNIT/ML ~~LOC~~ SOLN
6.0000 [IU] | Freq: Every day | SUBCUTANEOUS | Status: DC
  Administered 2020-05-08: 6 [IU] via SUBCUTANEOUS
  Filled 2020-05-08 (×2): qty 0.06

## 2020-05-08 MED ORDER — FUROSEMIDE 20 MG PO TABS
20.0000 mg | ORAL_TABLET | Freq: Every day | ORAL | Status: DC
  Administered 2020-05-09: 20 mg via ORAL
  Filled 2020-05-08: qty 1

## 2020-05-08 NOTE — Progress Notes (Signed)
Occupational Therapy Treatment Patient Details Name: Nathan [name] MRN: [id number] DOB: 10-31-1933 Today's Date: 05/08/2020    History of present illness Pt is an 87 y/o male admitted secondary to increased abdominal pain on 3/17. Found to have cholelithiasis and is s/p percutaneous cholecystostomy on 3/18. Pt also found to have NSTEMI. PMH includes DM, HTN, CHF, CKD, and CAD s/p CABG.   OT comments  Pt progressing towards OT goals this session. Agreeable to OOB, toilet transfer/peri care - currently requiring min A for boost from EOB, able to perform LB bathing (peri area) SpO2 on 3L for activity and then back to 2L for seated in chair. Pt educated on how to use call bell as TV remote - Pt agreeable to sit up in chair for an hour. RN aware. POC remains appropriate at this time. Next session to focus on energy conservation and education (hopefully daughter present) for all the uses of 3 in 1.   Follow Up Recommendations  Home health OT;Other (comment);Supervision/Assistance - 24 hour (initially)    Equipment Recommendations  3 in 1 bedside commode    Recommendations for Other Services      Precautions / Restrictions Precautions Precautions: Fall Precaution Comments: percutaneous drain on R. Restrictions Weight Bearing Restrictions: No       Mobility Bed Mobility   Bed Mobility: Supine to Sit     Supine to sit: Min guard     General bed mobility comments: increased time and effort    Transfers Overall transfer level: Needs assistance Equipment used: Rolling walker (2 wheeled) Transfers: Sit to/from [facility] Sit to Stand: Min assist Stand pivot transfers: Min guard       General transfer comment: min A to fully boost on initial stand from EOB    Balance Overall balance assessment: Needs assistance Sitting-balance support: No upper extremity supported;Feet supported Sitting balance-[name]: Fair Sitting balance - Comments: EOb without  support   Standing balance support: Bilateral upper extremity supported;During functional activity Standing balance-[name]: Fair Standing balance comment: requires external support from RW for balance                           ADL either performed or assessed with clinical judgement   ADL Overall ADL's : Needs assistance/impaired Eating/Feeding: Set up;Sitting Eating/Feeding Details (indicate cue type and reason): in recliener, able to cut up food, open containers Grooming: Wash/dry hands;Wash/dry face;Set up;Sitting Grooming Details (indicate cue type and reason): declined sink level today     Lower Body Bathing: Min guard;Minimal assistance;Sit to/from stand Lower Body Bathing Details (indicate cue type and reason): for peri care post-urination         Toilet Transfer: Minimal assistance;RW Toilet Transfer Details (indicate cue type and reason): min A for boost from bed into standing Toileting- Clothing Manipulation and Hygiene: Set up;Sit to/from stand;Minimal assistance Toileting - Clothing Manipulation Details (indicate cue type and reason): min A to boost to standing, able to perform peri care without assist     Functional mobility during ADLs: Min guard;Rolling walker;Cueing for safety (safe hand placement)       Vision       Perception     Praxis      Cognition Arousal/Alertness: Awake/alert Behavior During Therapy: WFL for tasks assessed/performed Overall Cognitive Status: Within Functional Limits for tasks assessed  Exercises     Shoulder Instructions       General Comments Pt on [place] throughout session, SpO2 with poor pleth, replaced at end of session, purple (personal) pulse ox reading >90 with increased O2 during activity; educated Pt on how to use call bell for television    Pertinent Vitals/ Pain       Pain Assessment: Faces Faces Pain Scale: Hurts a little bit Pain  Location: abdomen Pain Descriptors / Indicators: Aching;Operative site guarding Pain Intervention(s): Limited activity within patient's tolerance;Monitored during session;Repositioned  Home Living                                          Prior Functioning/Environment              Frequency  Min 2X/week        Progress Toward Goals  OT Goals(current goals can now be found in the care plan section)  Progress towards OT goals: Progressing toward goals  Acute Rehab OT Goals Patient Stated Goal: to feel better OT Goal Formulation: With patient Time For Goal Achievement: 05/19/20 Potential to Achieve Goals: Good  Plan Discharge plan remains appropriate;Frequency remains appropriate    Co-evaluation                 AM-PAC OT "6 Clicks" Daily Activity     Outcome Measure   Help from another person eating meals?: None Help from another person taking care of personal grooming?: None Help from another person toileting, which includes using toliet, bedpan, or urinal?: A Little Help from another person bathing (including washing, rinsing, drying)?: A Little Help from another person to put on and taking off regular upper body clothing?: None Help from another person to put on and taking off regular lower body clothing?: A Little 6 Click Score: 21    End of Session Equipment Utilized During Treatment: Rolling walker;Oxygen (2-3L)  OT Visit Diagnosis: Unsteadiness on feet (R26.81);Pain;Muscle weakness (generalized) (M62.81) Pain - Right/Left: Right Pain - part of body:  (abdomen)   Activity Tolerance Patient tolerated treatment well   Patient Left in chair;with call bell/phone within reach   Nurse Communication Mobility status        Time: 7014-1030 OT Time Calculation (min): 24 min  Charges: OT General Charges $OT Visit: 1 Visit OT Treatments $Self Care/Home Management : 23-37 mins  Nathan [name] OTR/L Acute Rehabilitation Services Pager:  [phone] Office: [place] 05/08/2020, 1:16 PM

## 2020-05-08 NOTE — Progress Notes (Addendum)
Progress Note    Nathan [name]  QMG:[id number] DOB: 1933-09-03  DOA: 05/01/2020 PCP: Leanna Battles, MD    Brief Narrative:    Medical records reviewed and are as summarized below:  Nathan [name] is an 87 y.o. male with a history of CAD status post CABG, hypertension, hyperlipidemia, type 2 diabetes mellitus, CKD stage IV, and chronic combined heart failure now presenting with cholecystitis and demand ischemia.  Slow to improved, not eating much.    Assessment/Plan:   Principal Problem:   Abdominal pain Active Problems:   Hypercholesteremia   Diabetes mellitus type 2 with complications (HCC)   Essential hypertension   Chronic combined systolic and diastolic CHF (congestive heart failure) (HCC)   Stage 4 chronic kidney disease (HCC)   Calculus of gallbladder with biliary obstruction but without cholecystitis   Elevated LFTs   Severe gram-negative sepsis (E coli/Kleb pna)) on admission with multiorgan dysfunction syndrome secondary to acute cholecystitis -Much improved-saline lock IV-sepsis physiology has resolved-he is now hypertensive -as he was a Poor lap chole candidate, IR Placed perc drain-IR: Will plan for routine exchange in 8-12 weeks             -Augmentin 3/22  Acute respiratory failure  -not on at home  -2L at rest and 4L at home (<87% on room air and more work of breathing when O2 removed)  -DG chest unrevealing other than poor inspiratory status  NSTEMI on admission  -likely demand ischemia -cards consult appreciated: ASA plus eliquis  AKI on CKD 4 -baseline appears to be 2-3 (2.4 in 5/21)  DM type 2 -SSI- increased to regular from very sensitive -low dose lantus    Family Communication/Anticipated D/C date and plan/Code Status   DVT prophylaxis: eliquis Code Status: DNR Disposition Plan: Status is: Inpatient daughter 3/23 Remains inpatient appropriate because:Inpatient level of care appropriate due to severity of illness   Dispo: The patient  is from: Home              Anticipated d/c is to: Home              Patient currently is not medically stable to d/c.   Difficult to place patient No     Medical Consultants:   Cards IR Palliative for GOC    Subjective:   Still not hungry  Objective:    Vitals:   05/07/20 2329 05/08/20 0400 05/08/20 0656 05/08/20 0800  BP: 115/82 106/67 127/81 (!) 94/44  Pulse: 97   74  Resp: 20 18  20   Temp: 98.2 F (36.8 C) 97.7 F (36.5 C)  (!) 97.5 F (36.4 C)  TempSrc: Oral Oral  Oral  SpO2: 99% 97%  97%  Weight:  80.6 kg    Height:        Intake/Output Summary (Last 24 hours) at 05/08/2020 1039 Last data filed at 05/08/2020 1002 Gross per 24 hour  Intake 360 ml  Output 1225 ml  Net -865 ml   Filed Weights   05/06/20 0432 05/07/20 0420 05/08/20 0400  Weight: 86 kg 82.7 kg 80.6 kg    Exam:  General: Appearance:    Chronically ill appearing male in no acute distress     Lungs:     respirations unlabored  Heart:    Normal heart rate. Normal rhythm.    MS:   All extremities are intact.   Neurologic:   Awake, alert, oriented x 3     Data Reviewed:   I have personally  reviewed following labs and imaging studies:  Labs: Labs show the following:   Basic Metabolic Panel: Recent Labs  Lab 05/03/20 0107 05/04/20 0137 05/05/20 0129 05/06/20 0126 05/07/20 0043 05/08/20 0336  NA 140 136 138 138 134* 135  K 3.7 3.3* 3.7 4.1 4.9 5.1  CL 107 103 105 106 104 106  CO2 19* 23 22 24  20* 19*  GLUCOSE 170* 250* 177* 136* 221* 217*  BUN 65* 70* 61* 50* 42* 41*  CREATININE 4.11* 3.67* 3.09* 2.78* 2.53* 2.72*  CALCIUM 7.5* 7.0* 7.3* 8.1* 9.0 9.3  MG 0.8* 1.8 1.9 1.9  --   --    GFR Estimated Creatinine Clearance: 22.2 mL/min (A) (by C-G formula based on SCr of 2.72 mg/dL (H)). Liver Function Tests: Recent Labs  Lab 05/04/20 0137 05/05/20 0129 05/06/20 0126 05/07/20 0043 05/08/20 0336  AST 310* 178* 108* 68* 34  ALT 256* 221* 175* 140* 93*  ALKPHOS 30* 37*  48 63 61  BILITOT 2.1* 2.4* 2.0* 2.0* 1.8*  PROT 5.3* 5.9* 6.2* 6.9 6.7  ALBUMIN 2.1* 2.3* 2.5* 2.7* 2.5*   No results for input(s): LIPASE, AMYLASE in the last 168 hours. No results for input(s): AMMONIA in the last 168 hours. Coagulation profile No results for input(s): INR, PROTIME in the last 168 hours.  CBC: Recent Labs  Lab 05/04/20 0137 05/05/20 0129 05/06/20 0126 05/07/20 0043 05/08/20 0108  WBC 16.1* 16.4* 15.2* 15.3* 6.6  NEUTROABS  --   --   --  10.3* 4.1  HGB 10.8* 11.6* 12.3* 14.4 12.3*  HCT 31.0* 33.3* 36.5* 41.0 36.2*  MCV 87.8 88.3 88.6 87.2 92.6  PLT 130* 135* 172 181 183   Cardiac Enzymes: No results for input(s): CKTOTAL, CKMB, CKMBINDEX, TROPONINI in the last 168 hours. BNP (last 3 results) No results for input(s): PROBNP in the last 8760 hours. CBG: Recent Labs  Lab 05/07/20 0553 05/07/20 1059 05/07/20 1551 05/07/20 2154 05/08/20 0613  GLUCAP 189* 317* 269* 269* 217*   D-Dimer: No results for input(s): DDIMER in the last 72 hours. Hgb A1c: No results for input(s): HGBA1C in the last 72 hours. Lipid Profile: No results for input(s): CHOL, HDL, LDLCALC, TRIG, CHOLHDL, LDLDIRECT in the last 72 hours. Thyroid function studies: No results for input(s): TSH, T4TOTAL, T3FREE, THYROIDAB in the last 72 hours.  Invalid input(s): FREET3 Anemia work up: No results for input(s): VITAMINB12, FOLATE, FERRITIN, TIBC, IRON, RETICCTPCT in the last 72 hours. Sepsis Labs: Recent Labs  Lab 05/02/20 0813 05/02/20 1136 05/03/20 0107 05/05/20 0129 05/06/20 0126 05/07/20 0043 05/08/20 0108  WBC  --   --    < > 16.4* 15.2* 15.3* 6.6  LATICACIDVEN 4.8* 5.1*  --   --   --   --   --    < > = values in this interval not displayed.    Microbiology Recent Results (from the past 240 hour(s))  Resp Panel by RT-PCR (Flu A&B, Covid) Nasopharyngeal Swab     Status: None   Collection Time: 05/01/20  4:40 AM   Specimen: Nasopharyngeal Swab; Nasopharyngeal(NP) swabs  in vial transport medium  Result Value Ref Range Status   SARS Coronavirus 2 by RT PCR NEGATIVE NEGATIVE Final    Comment: (NOTE) SARS-CoV-2 target nucleic acids are NOT DETECTED.  The SARS-CoV-2 RNA is generally detectable in upper respiratory specimens during the acute phase of infection. The lowest concentration of SARS-CoV-2 viral copies this assay can detect is 138 copies/mL. A negative result does not preclude  SARS-Cov-2 infection and should not be used as the sole basis for treatment or other patient management decisions. A negative result may occur with  improper specimen collection/handling, submission of specimen other than nasopharyngeal swab, presence of viral mutation(s) within the areas targeted by this assay, and inadequate number of viral copies(<138 copies/mL). A negative result must be combined with clinical observations, patient history, and epidemiological information. The expected result is Negative.  Fact Sheet for Patients:  EntrepreneurPulse.com.au  Fact Sheet for Healthcare Providers:  IncredibleEmployment.be  This test is no t yet approved or cleared by the [place] FDA and  has been authorized for detection and/or diagnosis of SARS-CoV-2 by FDA under an Emergency Use Authorization (EUA). This EUA will remain  in effect (meaning this test can be used) for the duration of the COVID-19 declaration under Section 564(b)(1) of the Act, 21 U.S.C.section 360bbb-3(b)(1), unless the authorization is terminated  or revoked sooner.       Influenza A by PCR NEGATIVE NEGATIVE Final   Influenza B by PCR NEGATIVE NEGATIVE Final    Comment: (NOTE) The Xpert Xpress SARS-CoV-2/FLU/RSV plus assay is intended as an aid in the diagnosis of influenza from Nasopharyngeal swab specimens and should not be used as a sole basis for treatment. Nasal washings and aspirates are unacceptable for Xpert Xpress  SARS-CoV-2/FLU/RSV testing.  Fact Sheet for Patients: EntrepreneurPulse.com.au  Fact Sheet for Healthcare Providers: IncredibleEmployment.be  This test is not yet approved or cleared by the [place] FDA and has been authorized for detection and/or diagnosis of SARS-CoV-2 by FDA under an Emergency Use Authorization (EUA). This EUA will remain in effect (meaning this test can be used) for the duration of the COVID-19 declaration under Section 564(b)(1) of the Act, 21 U.S.C. section 360bbb-3(b)(1), unless the authorization is terminated or revoked.  Performed at [facility], [place] [address] [place], [place] [address]   MRSA PCR Screening     Status: None   Collection Time: 05/01/20  6:56 PM   Specimen: Nasopharyngeal  Result Value Ref Range Status   MRSA by PCR NEGATIVE NEGATIVE Final    Comment:        The GeneXpert MRSA Assay (FDA approved for NASAL specimens only), is one component of a comprehensive MRSA colonization surveillance program. It is not intended to diagnose MRSA infection nor to guide or monitor treatment for MRSA infections. Performed at [facility], [place] [address] [place], [place] [address]   Aerobic/Anaerobic Culture (surgical/deep wound)     Status: None (Preliminary result)   Collection Time: 05/02/20 11:13 AM   Specimen: BILE  Result Value Ref Range Status   Specimen Description BILE  Final   Special Requests NONE  Final   Gram Stain   Final    RARE WBC PRESENT, PREDOMINANTLY PMN ABUNDANT GRAM VARIABLE ROD MODERATE GRAM POSITIVE COCCI    Culture   Final    ABUNDANT KLEBSIELLA PNEUMONIAE MODERATE ESCHERICHIA COLI NO ANAEROBES ISOLATED CULTURE REINCUBATED FOR BETTER GROWTH Performed at [facility], [place] [address], [place],  [address]    Report Status PENDING  Incomplete   Organism ID, Bacteria KLEBSIELLA PNEUMONIAE  Final   Organism ID, Bacteria ESCHERICHIA COLI  Final       Susceptibility   Escherichia coli - MIC*    AMPICILLIN <=2 SENSITIVE Sensitive     CEFAZOLIN <=4 SENSITIVE Sensitive     CEFEPIME <=0.12 SENSITIVE Sensitive     CEFTAZIDIME <=1 SENSITIVE Sensitive  CEFTRIAXONE <=0.25 SENSITIVE Sensitive     CIPROFLOXACIN <=0.25 SENSITIVE Sensitive     GENTAMICIN <=1 SENSITIVE Sensitive     IMIPENEM <=0.25 SENSITIVE Sensitive     TRIMETH/SULFA <=20 SENSITIVE Sensitive     AMPICILLIN/SULBACTAM <=2 SENSITIVE Sensitive     PIP/TAZO <=4 SENSITIVE Sensitive     * MODERATE ESCHERICHIA COLI   Klebsiella pneumoniae - MIC*    AMPICILLIN >=32 RESISTANT Resistant     CEFAZOLIN <=4 SENSITIVE Sensitive     CEFEPIME <=0.12 SENSITIVE Sensitive     CEFTAZIDIME <=1 SENSITIVE Sensitive     CEFTRIAXONE <=0.25 SENSITIVE Sensitive     CIPROFLOXACIN <=0.25 SENSITIVE Sensitive     GENTAMICIN <=1 SENSITIVE Sensitive     IMIPENEM <=0.25 SENSITIVE Sensitive     TRIMETH/SULFA <=20 SENSITIVE Sensitive     AMPICILLIN/SULBACTAM 8 SENSITIVE Sensitive     PIP/TAZO <=4 SENSITIVE Sensitive     * ABUNDANT KLEBSIELLA PNEUMONIAE    Procedures and diagnostic studies:  DG CHEST PORT 1 VIEW  Result Date: 05/07/2020 CLINICAL DATA:  Hypoxia, dyspnea EXAM: PORTABLE CHEST 1 VIEW COMPARISON:  05/01/2020 FINDINGS: Lung volumes are small and there is discoid atelectasis at the right lung base. Pulmonary insufflation is stable, however, since prior examination. No pneumothorax or pleural effusion. Cardiac size is within normal limits. Median sternotomy has been performed. Pulmonary vascularity is normal. No acute bone abnormality. Pigtail catheter noted within the right upper quadrant, new since prior examination. IMPRESSION: Stable pulmonary hypoinflation. Interval right upper quadrant pigtail drainage catheter placement. Electronically Signed   By: Fidela Salisbury MD   On: 05/07/2020 13:51    Medications:   . amoxicillin-clavulanate  500 mg Oral Q12H  . apixaban  2.5 mg Oral BID  .  aspirin EC  81 mg Oral Daily  . [START ON 05/09/2020] furosemide  20 mg Oral Daily  . hydrALAZINE  25 mg Oral Q8H  . insulin aspart  0-15 Units Subcutaneous TID WC  . insulin aspart  0-5 Units Subcutaneous QHS  . insulin glargine  6 Units Subcutaneous Daily  . isosorbide mononitrate  30 mg Oral Daily  . magic mouthwash  5 mL Oral TID  . melatonin  3 mg Oral QHS  . metoprolol succinate  12.5 mg Oral Daily  . pantoprazole  40 mg Oral Daily  . sodium chloride flush  5 mL Intracatheter Q8H  . tamsulosin  0.4 mg Oral QPC supper   Continuous Infusions: . sodium chloride Stopped (05/03/20 0328)     LOS: 7 days   Geradine Girt  [facility]   How to contact the [facility] Attending or Consulting provider [place] or covering provider during after hours [place], for this patient?  1. Check the care team in [facility] and look for a) attending/consulting TR[facility] provider listed and b) the [facility] team listed 2. Log into [email or web address] and use [place]'s universal password to access. If you do not have the password, please contact the hospital operator. 3. Locate the [facility] provider you are looking for under [facility] and page to a number that you can be directly reached. 4. If you still have difficulty reaching the provider, please page the [facility] (Director on Call) for the Hospitalists listed on amion for assistance.  05/08/2020, 10:39 AM

## 2020-05-08 NOTE — Progress Notes (Addendum)
Inpatient Diabetes Program Recommendations  AACE/ADA: New Consensus Statement on Inpatient Glycemic Control (2015)  Target Ranges:  Prepandial:   less than 140 mg/dL      Peak postprandial:   less than 180 mg/dL (1-2 hours)      Critically ill patients:  140 - 180 mg/dL   Lab Results  Component Value Date   GLUCAP 217 (H) 05/08/2020   HGBA1C 7.9 (H) 05/01/2020    Review of Glycemic Control Results for Nathan [name], Nathan [name] (MRN [id number]) as of 05/08/2020 10:11  Ref. Range 05/07/2020 10:59 05/07/2020 15:51 05/07/2020 21:54 05/08/2020 06:13  Glucose-Capillary Latest Ref Range: 70 - 99 mg/dL 317 (H) 269 (H) 269 (H) 217 (H)   Diabetes history:  DM2 Outpatient Diabetes medications:  70/30 35 units with breakfast and 20 units with supper Current orders for Inpatient glycemic control:  Novolog 0-15 units TID & 0-5 units QHS, Novolog 4 units TID (starting this morning)  Inpatient Diabetes Program Recommendations:     Lantus 10 units BID.  Will continue to follow while inpatient.  Thank you, Reche Dixon, RN, BSN Diabetes Coordinator Inpatient Diabetes Program [phone] (team pager from 8a-5p)

## 2020-05-08 NOTE — TOC Progression Note (Signed)
Transition of Care [facility]) - Progression Note    Patient Details  Name: Nathan [name] MRN: [id number] Date of Birth: 1933/05/23  Transition of [facility]) CM/SW Contact  Zenon Mayo, RN Phone Number: 05/08/2020, 4:32 PM  Clinical Narrative:    NCM offered choice for [facility] services, patient  States he does not have a preference. He has a walker at home. He states he is ok with [employer] supplying the home oxygen.  His daughter will transport him home at dc.  3/25- today, patient is not sure if he wants to go to SNF or home with HHPT.  He said he will know in the am tomorrow.          Expected Discharge Plan and Services                                                 Social Determinants of Health (SDOH) Interventions    Readmission Risk Interventions No flowsheet data found.

## 2020-05-09 DIAGNOSIS — Z7189 Other specified counseling: Secondary | ICD-10-CM

## 2020-05-09 DIAGNOSIS — R638 Other symptoms and signs concerning food and fluid intake: Secondary | ICD-10-CM

## 2020-05-09 DIAGNOSIS — Z66 Do not resuscitate: Secondary | ICD-10-CM

## 2020-05-09 DIAGNOSIS — R531 Weakness: Secondary | ICD-10-CM

## 2020-05-09 DIAGNOSIS — Z515 Encounter for palliative care: Secondary | ICD-10-CM

## 2020-05-09 DIAGNOSIS — R5381 Other malaise: Secondary | ICD-10-CM

## 2020-05-09 DIAGNOSIS — Z789 Other specified health status: Secondary | ICD-10-CM

## 2020-05-09 DIAGNOSIS — N170 Acute kidney failure with tubular necrosis: Secondary | ICD-10-CM

## 2020-05-09 LAB — GLUCOSE, CAPILLARY
Glucose-Capillary: 219 mg/dL — ABNORMAL HIGH (ref 70–99)
Glucose-Capillary: 275 mg/dL — ABNORMAL HIGH (ref 70–99)
Glucose-Capillary: 312 mg/dL — ABNORMAL HIGH (ref 70–99)
Glucose-Capillary: 247 mg/dL — ABNORMAL HIGH (ref 70–99)

## 2020-05-09 LAB — AEROBIC/ANAEROBIC CULTURE W GRAM STAIN (SURGICAL/DEEP WOUND)

## 2020-05-09 LAB — CBC WITH DIFFERENTIAL/PLATELET
Abs Immature Granulocytes: 0 10*3/uL (ref 0.00–0.07)
Basophils Absolute: 0 10*3/uL (ref 0.0–0.1)
Basophils Relative: 0 %
Eosinophils Absolute: 0.6 10*3/uL — ABNORMAL HIGH (ref 0.0–0.5)
Eosinophils Relative: 4 %
HCT: 41.5 % (ref 39.0–52.0)
Hemoglobin: 13.8 g/dL (ref 13.0–17.0)
Lymphocytes Relative: 15 %
Lymphs Abs: 2.3 10*3/uL (ref 0.7–4.0)
MCH: 29.7 pg (ref 26.0–34.0)
MCHC: 33.3 g/dL (ref 30.0–36.0)
MCV: 89.4 fL (ref 80.0–100.0)
Monocytes Absolute: 0.9 10*3/uL (ref 0.1–1.0)
Monocytes Relative: 6 %
Neutro Abs: 11.3 10*3/uL — ABNORMAL HIGH (ref 1.7–7.7)
Neutrophils Relative %: 75 %
Platelets: 233 10*3/uL (ref 150–400)
RBC: 4.64 MIL/uL (ref 4.22–5.81)
RDW: 13.7 % (ref 11.5–15.5)
WBC: 15.1 10*3/uL — ABNORMAL HIGH (ref 4.0–10.5)
nRBC: 0 % (ref 0.0–0.2)
nRBC: 0 /100 WBC

## 2020-05-09 LAB — BASIC METABOLIC PANEL
Anion gap: 10 (ref 5–15)
BUN: 50 mg/dL — ABNORMAL HIGH (ref 8–23)
CO2: 21 mmol/L — ABNORMAL LOW (ref 22–32)
Calcium: 9.4 mg/dL (ref 8.9–10.3)
Chloride: 101 mmol/L (ref 98–111)
Creatinine, Ser: 2.96 mg/dL — ABNORMAL HIGH (ref 0.61–1.24)
GFR, Estimated: 20 mL/min — ABNORMAL LOW (ref >60)
Glucose, Bld: 248 mg/dL — ABNORMAL HIGH (ref 70–99)
Potassium: 4.7 mmol/L (ref 3.5–5.1)
Sodium: 132 mmol/L — ABNORMAL LOW (ref 135–145)

## 2020-05-09 LAB — VITAMIN B12: Vitamin B-12: 1416 pg/mL — ABNORMAL HIGH (ref 180–914)

## 2020-05-09 MED ORDER — INSULIN GLARGINE 100 UNIT/ML ~~LOC~~ SOLN
14.0000 [IU] | Freq: Every day | SUBCUTANEOUS | Status: DC
  Administered 2020-05-09: 14 [IU] via SUBCUTANEOUS
  Filled 2020-05-09 (×3): qty 0.14

## 2020-05-09 MED ORDER — ENSURE ENLIVE PO LIQD
237.0000 mL | Freq: Two times a day (BID) | ORAL | Status: DC
  Administered 2020-05-10 – 2020-05-12 (×5): 237 mL via ORAL

## 2020-05-09 NOTE — Progress Notes (Addendum)
Inpatient Diabetes Program Recommendations  AACE/ADA: New Consensus Statement on Inpatient Glycemic Control (2015)  Target Ranges:  Prepandial:   less than 140 mg/dL      Peak postprandial:   less than 180 mg/dL (1-2 hours)      Critically ill patients:  140 - 180 mg/dL   Lab Results  Component Value Date   GLUCAP 275 (H) 05/09/2020   HGBA1C 7.9 (H) 05/01/2020    Review of Glycemic Control Results for Nathan [name], Nathan [name] (MRN [id number]) as of 05/09/2020 11:55  Ref. Range 05/09/2020 06:16 05/09/2020 11:40  Glucose-Capillary Latest Ref Range: 70 - 99 mg/dL 219 (H) 275 (H)  Diabetes history:  DM2 Outpatient Diabetes medications:  70/30 35 units with breakfast and 20 units with supper Current orders for Inpatient glycemic control:  Novolog 0-15 units TID & 0-5 units QHS, Lantus 14 units daily (increased today from 6 units yesterday)   Inpatient Diabetes Program Recommendations:     Patient receives a total of 8 units BID with home dose of 70/30.  Please consider adding meal coverage:  Novolog 4 units TID with meals if eats at least 50%.  Will continue to follow while inpatient.  Thank you, Reche Dixon, RN, BSN Diabetes Coordinator Inpatient Diabetes Program [phone] (team pager from 8a-5p)

## 2020-05-09 NOTE — Progress Notes (Signed)
Progress Note    Nathan [name]  ZDG:[id number] DOB: 06/04/1933  DOA: 05/01/2020 PCP: Leanna Battles, MD    Brief Narrative:    Medical records reviewed and are as summarized below:  Nathan [name] is an 87 y.o. male with a history of CAD status post CABG, hypertension, hyperlipidemia, type 2 diabetes mellitus, CKD stage IV, and chronic combined heart failure now presenting with cholecystitis and demand ischemia.  Slow to improved, not eating much.    Assessment/Plan:   Principal Problem:   Abdominal pain Active Problems:   Hypercholesteremia   Diabetes mellitus type 2 with complications (HCC)   Essential hypertension   Chronic combined systolic and diastolic CHF (congestive heart failure) (HCC)   Stage 4 chronic kidney disease (HCC)   Calculus of gallbladder with biliary obstruction but without cholecystitis   Elevated LFTs   Severe gram-negative sepsis (E coli/Kleb pna)) on admission with multiorgan dysfunction syndrome secondary to acute cholecystitis -Much improved-saline lock IV-sepsis physiology has resolved-he is now hypertensive -as he was a Poor lap chole candidate, IR Placed perc drain-IR: Will plan for routine exchange in 8-12 weeks             -Augmentin 3/22  Acute respiratory failure  -not on at home  -2L at rest and 4L at home (<87% on room air and more work of breathing when O2 removed)  -DG chest unrevealing other than poor inspiratory status  NSTEMI on admission  -likely demand ischemia -cards consult appreciated: ASA plus eliquis  AKI on CKD 4 -baseline appears to be 2-3 (2.4 in 5/21)  DM type 2 -SSI- increased to regular from very sensitive -increased lantus  Orthostatic hypotension -? Volume vs DM neuropathy -TED hose/abdominal binder   Family Communication/Anticipated D/C date and plan/Code Status   DVT prophylaxis: eliquis Code Status: DNR Disposition Plan: Status is: Inpatient daughter 3/23 Remains inpatient appropriate  because:Inpatient level of care appropriate due to severity of illness   Dispo: The patient is from: Home              Anticipated d/c is to: Home              Patient currently is not medically stable to d/c.   Difficult to place patient No     Medical Consultants:   Cards IR Palliative for GOC    Subjective:   Still not eating much Was orthostatic with PT today  Objective:    Vitals:   05/09/20 0841 05/09/20 1100 05/09/20 1137 05/09/20 1155  BP: 129/68  (!) 131/54   Pulse:   (!) 106 (!) 107  Resp: 16  (!) 25 19  Temp: (!) 97.5 F (36.4 C)  97.9 F (36.6 C)   TempSrc: Oral  Axillary   SpO2: 91% 93% 92% 93%  Weight:      Height:        Intake/Output Summary (Last 24 hours) at 05/09/2020 1334 Last data filed at 05/09/2020 9518 Gross per 24 hour  Intake 10 ml  Output 1050 ml  Net -1040 ml   Filed Weights   05/07/20 0420 05/08/20 0400 05/09/20 0432  Weight: 82.7 kg 80.6 kg 80.3 kg    Exam:  General: Appearance:    elderly male in no acute distress     Lungs:     Clear to auscultation bilaterally, respirations unlabored  Heart:    Tachycardic. Normal rhythm.    MS:   All extremities are intact.   Neurologic:   Awake, alert  Data Reviewed:   I have personally reviewed following labs and imaging studies:  Labs: Labs show the following:   Basic Metabolic Panel: Recent Labs  Lab 05/03/20 0107 05/04/20 0137 05/05/20 0129 05/06/20 0126 05/07/20 0043 05/08/20 0336 05/09/20 0123  NA 140 136 138 138 134* 135 132*  K 3.7 3.3* 3.7 4.1 4.9 5.1 4.7  CL 107 103 105 106 104 106 101  CO2 19* 23 22 24  20* 19* 21*  GLUCOSE 170* 250* 177* 136* 221* 217* 248*  BUN 65* 70* 61* 50* 42* 41* 50*  CREATININE 4.11* 3.67* 3.09* 2.78* 2.53* 2.72* 2.96*  CALCIUM 7.5* 7.0* 7.3* 8.1* 9.0 9.3 9.4  MG 0.8* 1.8 1.9 1.9  --   --   --    GFR Estimated Creatinine Clearance: 20.4 mL/min (A) (by C-G formula based on SCr of 2.96 mg/dL (H)). Liver Function  Tests: Recent Labs  Lab 05/04/20 0137 05/05/20 0129 05/06/20 0126 05/07/20 0043 05/08/20 0336  AST 310* 178* 108* 68* 34  ALT 256* 221* 175* 140* 93*  ALKPHOS 30* 37* 48 63 61  BILITOT 2.1* 2.4* 2.0* 2.0* 1.8*  PROT 5.3* 5.9* 6.2* 6.9 6.7  ALBUMIN 2.1* 2.3* 2.5* 2.7* 2.5*   No results for input(s): LIPASE, AMYLASE in the last 168 hours. No results for input(s): AMMONIA in the last 168 hours. Coagulation profile No results for input(s): INR, PROTIME in the last 168 hours.  CBC: Recent Labs  Lab 05/05/20 0129 05/06/20 0126 05/07/20 0043 05/08/20 0108 05/09/20 0123  WBC 16.4* 15.2* 15.3* 6.6 15.1*  NEUTROABS  --   --  10.3* 4.1 11.3*  HGB 11.6* 12.3* 14.4 12.3* 13.8  HCT 33.3* 36.5* 41.0 36.2* 41.5  MCV 88.3 88.6 87.2 92.6 89.4  PLT 135* 172 181 183 233   Cardiac Enzymes: No results for input(s): CKTOTAL, CKMB, CKMBINDEX, TROPONINI in the last 168 hours. BNP (last 3 results) No results for input(s): PROBNP in the last 8760 hours. CBG: Recent Labs  Lab 05/08/20 0613 05/08/20 1551 05/08/20 2115 05/09/20 0616 05/09/20 1140  GLUCAP 217* 322* 174* 219* 275*   D-Dimer: No results for input(s): DDIMER in the last 72 hours. Hgb A1c: No results for input(s): HGBA1C in the last 72 hours. Lipid Profile: No results for input(s): CHOL, HDL, LDLCALC, TRIG, CHOLHDL, LDLDIRECT in the last 72 hours. Thyroid function studies: No results for input(s): TSH, T4TOTAL, T3FREE, THYROIDAB in the last 72 hours.  Invalid input(s): FREET3 Anemia work up: Recent Labs    05/09/20 0123  VITAMINB12 1,416*   Sepsis Labs: Recent Labs  Lab 05/06/20 0126 05/07/20 0043 05/08/20 0108 05/09/20 0123  WBC 15.2* 15.3* 6.6 15.1*    Microbiology Recent Results (from the past 240 hour(s))  Resp Panel by RT-PCR (Flu A&B, Covid) Nasopharyngeal Swab     Status: None   Collection Time: 05/01/20  4:40 AM   Specimen: Nasopharyngeal Swab; Nasopharyngeal(NP) swabs in vial transport medium   Result Value Ref Range Status   SARS Coronavirus 2 by RT PCR NEGATIVE NEGATIVE Final    Comment: (NOTE) SARS-CoV-2 target nucleic acids are NOT DETECTED.  The SARS-CoV-2 RNA is generally detectable in upper respiratory specimens during the acute phase of infection. The lowest concentration of SARS-CoV-2 viral copies this assay can detect is 138 copies/mL. A negative result does not preclude SARS-Cov-2 infection and should not be used as the sole basis for treatment or other patient management decisions. A negative result may occur with  improper specimen collection/handling, submission of specimen  other than nasopharyngeal swab, presence of viral mutation(s) within the areas targeted by this assay, and inadequate number of viral copies(<138 copies/mL). A negative result must be combined with clinical observations, patient history, and epidemiological information. The expected result is Negative.  Fact Sheet for Patients:  EntrepreneurPulse.com.au  Fact Sheet for Healthcare Providers:  IncredibleEmployment.be  This test is no t yet approved or cleared by the [place] FDA and  has been authorized for detection and/or diagnosis of SARS-CoV-2 by FDA under an Emergency Use Authorization (EUA). This EUA will remain  in effect (meaning this test can be used) for the duration of the COVID-19 declaration under Section 564(b)(1) of the Act, 21 U.S.C.section 360bbb-3(b)(1), unless the authorization is terminated  or revoked sooner.       Influenza A by PCR NEGATIVE NEGATIVE Final   Influenza B by PCR NEGATIVE NEGATIVE Final    Comment: (NOTE) The Xpert Xpress SARS-CoV-2/FLU/RSV plus assay is intended as an aid in the diagnosis of influenza from Nasopharyngeal swab specimens and should not be used as a sole basis for treatment. Nasal washings and aspirates are unacceptable for Xpert Xpress SARS-CoV-2/FLU/RSV testing.  Fact Sheet for  Patients: EntrepreneurPulse.com.au  Fact Sheet for Healthcare Providers: IncredibleEmployment.be  This test is not yet approved or cleared by the [place] FDA and has been authorized for detection and/or diagnosis of SARS-CoV-2 by FDA under an Emergency Use Authorization (EUA). This EUA will remain in effect (meaning this test can be used) for the duration of the COVID-19 declaration under Section 564(b)(1) of the Act, 21 U.S.C. section 360bbb-3(b)(1), unless the authorization is terminated or revoked.  Performed at [facility], [place] [address] [place], [place] [address]   MRSA PCR Screening     Status: None   Collection Time: 05/01/20  6:56 PM   Specimen: Nasopharyngeal  Result Value Ref Range Status   MRSA by PCR NEGATIVE NEGATIVE Final    Comment:        The GeneXpert MRSA Assay (FDA approved for NASAL specimens only), is one component of a comprehensive MRSA colonization surveillance program. It is not intended to diagnose MRSA infection nor to guide or monitor treatment for MRSA infections. Performed at [facility], [place] [address], [place], [place] [address]   Aerobic/Anaerobic Culture (surgical/deep wound)     Status: None   Collection Time: 05/02/20 11:13 AM   Specimen: BILE  Result Value Ref Range Status   Specimen Description BILE  Final   Special Requests NONE  Final   Gram Stain   Final    RARE WBC PRESENT, PREDOMINANTLY PMN ABUNDANT GRAM VARIABLE ROD MODERATE GRAM POSITIVE COCCI    Culture   Final    ABUNDANT KLEBSIELLA PNEUMONIAE MODERATE ESCHERICHIA COLI FEW ENTEROCOCCUS FAECALIS NO ANAEROBES ISOLATED Performed at [facility], [address] [place], [place] [address]    Report Status 05/09/2020 FINAL  Final   Organism ID, Bacteria KLEBSIELLA PNEUMONIAE  Final   Organism ID, Bacteria ESCHERICHIA COLI  Final   Organism ID, Bacteria ENTEROCOCCUS FAECALIS  Final      Susceptibility    Escherichia coli - MIC*    AMPICILLIN <=2 SENSITIVE Sensitive     CEFAZOLIN <=4 SENSITIVE Sensitive     CEFEPIME <=0.12 SENSITIVE Sensitive     CEFTAZIDIME <=1 SENSITIVE Sensitive     CEFTRIAXONE <=0.25 SENSITIVE Sensitive     CIPROFLOXACIN <=0.25 SENSITIVE Sensitive     GENTAMICIN <=1 SENSITIVE Sensitive  IMIPENEM <=0.25 SENSITIVE Sensitive     TRIMETH/SULFA <=20 SENSITIVE Sensitive     AMPICILLIN/SULBACTAM <=2 SENSITIVE Sensitive     PIP/TAZO <=4 SENSITIVE Sensitive     * MODERATE ESCHERICHIA COLI   Enterococcus faecalis - MIC*    AMPICILLIN <=2 SENSITIVE Sensitive     VANCOMYCIN <=0.5 SENSITIVE Sensitive     GENTAMICIN SYNERGY SENSITIVE Sensitive     * FEW ENTEROCOCCUS FAECALIS   Klebsiella pneumoniae - MIC*    AMPICILLIN >=32 RESISTANT Resistant     CEFAZOLIN <=4 SENSITIVE Sensitive     CEFEPIME <=0.12 SENSITIVE Sensitive     CEFTAZIDIME <=1 SENSITIVE Sensitive     CEFTRIAXONE <=0.25 SENSITIVE Sensitive     CIPROFLOXACIN <=0.25 SENSITIVE Sensitive     GENTAMICIN <=1 SENSITIVE Sensitive     IMIPENEM <=0.25 SENSITIVE Sensitive     TRIMETH/SULFA <=20 SENSITIVE Sensitive     AMPICILLIN/SULBACTAM 8 SENSITIVE Sensitive     PIP/TAZO <=4 SENSITIVE Sensitive     * ABUNDANT KLEBSIELLA PNEUMONIAE    Procedures and diagnostic studies:  No results found.  Medications:   . amoxicillin-clavulanate  500 mg Oral Q12H  . apixaban  2.5 mg Oral BID  . aspirin EC  81 mg Oral Daily  . hydrALAZINE  25 mg Oral Q8H  . insulin aspart  0-15 Units Subcutaneous TID WC  . insulin aspart  0-5 Units Subcutaneous QHS  . insulin glargine  14 Units Subcutaneous Daily  . isosorbide mononitrate  30 mg Oral Daily  . magic mouthwash  5 mL Oral TID  . melatonin  3 mg Oral QHS  . metoprolol succinate  12.5 mg Oral Daily  . pantoprazole  40 mg Oral Daily  . sodium chloride flush  5 mL Intracatheter Q8H  . tamsulosin  0.4 mg Oral QPC supper   Continuous Infusions: . sodium chloride Stopped  (05/03/20 0328)     LOS: 8 days   Geradine Girt  [facility]   How to contact the [facility] Attending or Consulting provider [place] or covering provider during after hours [place], for this patient?  1. Check the care team in [facility] and look for a) attending/consulting [facility] provider listed and b) the [facility] team listed 2. Log into [email or web address] and use [place]'s universal password to access. If you do not have the password, please contact the hospital operator. 3. Locate the [facility] provider you are looking for under [facility] and page to a number that you can be directly reached. 4. If you still have difficulty reaching the provider, please page the [facility] (Director on Call) for the Hospitalists listed on amion for assistance.  05/09/2020, 1:34 PM

## 2020-05-09 NOTE — Plan of Care (Signed)

## 2020-05-09 NOTE — Progress Notes (Signed)
Physical Therapy Treatment Patient Details Name: Nathan [name] MRN: [id number] DOB: April 05, 1933 Today's Date: 05/09/2020    History of Present Illness Pt is an 87 y/o male admitted secondary to increased abdominal pain on 3/17. Found to have cholelithiasis and is s/p percutaneous cholecystostomy on 3/18. Pt also found to have NSTEMI. PMH includes DM, HTN, CHF, CKD, and CAD s/p CABG.    PT Comments    Pt received in supine, agreeable to therapy session and with good participation, but limited due to symptomatic orthostatic hypotension in standing postures. Pt performed supine/seated UE/LE therapeutic exercises with good tolerance and his daughter given HEP handout, encouraged him to perform TID for improved hemodynamics and strengthening. Pt able to wean to 1L O2 [place] during bedside activity with SpO2 WNL and remained on 1L at end of session with bed in chair position. Encouraged OOB to chair once he is feeling better later in day. Orthostatic BP Supine 105/73 (85)  Sitting at 0 mins 111/74 (84)  Sitting after standing 89/73 (79)  Standing 62/36 (44)  After return to supine (pillow under legs) 131/54 (78)  Pt continues to benefit from PT services to progress toward functional mobility goals. Continue to recommend HHPT, pending progress with mobility once vitals more stable.    Follow Up Recommendations  Home health PT;Supervision for mobility/OOB     Equipment Recommendations  3in1 (PT);Wheelchair (measurements PT);Wheelchair cushion (measurements PT);Other (comment) (pulse oximeter, home O2)    Recommendations for Other Services       Precautions / Restrictions Precautions Precautions: Fall Precaution Comments: percutaneous drain on R. Restrictions Weight Bearing Restrictions: No    Mobility  Bed Mobility Overal bed mobility: Needs Assistance Bed Mobility: Supine to Sit     Supine to sit: Supervision Sit to supine: Min guard   General bed mobility comments: increased time  and effort, HOB partially elevated ~25 deg initially and flat when returning to bed    Transfers Overall transfer level: Needs assistance Equipment used: Rolling walker (2 wheeled) Transfers: Sit to/from [facility] Sit to Stand: Min guard         General transfer comment: from EOB, cues for hand placement needed; dizzy upon standing  Ambulation/Gait             General Gait Details: UTA, pt orthostatic see assessment comments   Stairs             Wheelchair Mobility    Modified Rankin (Stroke Patients Only)       Balance Overall balance assessment: Needs assistance Sitting-balance support: No upper extremity supported;Feet supported Sitting balance-[name]: Good Sitting balance - Comments: EOB without support   Standing balance support: Bilateral upper extremity supported;During functional activity Standing balance-[name]: Poor Standing balance comment: requires external support from RW for balance                            Cognition Arousal/Alertness: Awake/alert Behavior During Therapy: WFL for tasks assessed/performed Overall Cognitive Status: Within Functional Limits for tasks assessed                                 General Comments: cooperative      Exercises General Exercises - Lower Extremity Ankle Circles/Pumps: AROM;Both;10 reps;Supine Short Arc Quad: AROM;Both;10 reps;Supine Other Exercises Other Exercises: BUE AROM: elbow flex/ext with shoulders flexed to 90*, 2 sets x10 reps ea supine and  seated    General Comments General comments (skin integrity, edema, etc.): Pt received on 2L O2 [place] to wean per RN/MD. SpO2 90-94% on 1L O2 [place] seated EOB, also WNL standing at bedside however too dizzy to perform gait trial on 1L once standing; HR 107 bpm resting and up to 116 bpm static standing      Pertinent Vitals/Pain Pain Assessment: Faces Faces Pain Scale: Hurts a little bit Pain  Location: abdomen Pain Descriptors / Indicators: Aching;Operative site guarding Pain Intervention(s): Monitored during session;Repositioned    Home Living                      Prior Function            PT Goals (current goals can now be found in the care plan section) Acute Rehab PT Goals Patient Stated Goal: to feel better PT Goal Formulation: With patient Time For Goal Achievement: 05/18/20 Potential to Achieve Goals: Fair Progress towards PT goals: Progressing toward goals (slow progress today 2/2 vitals unstable)    Frequency    Min 3X/week      PT Plan Current plan remains appropriate    Co-evaluation              AM-PAC PT "6 Clicks" Mobility   Outcome Measure  Help needed turning from your back to your side while in a flat bed without using bedrails?: None Help needed moving from lying on your back to sitting on the side of a flat bed without using bedrails?: A Little Help needed moving to and from a bed to a chair (including a wheelchair)?: A Little Help needed standing up from a chair using your arms (e.g., wheelchair or bedside chair)?: A Little Help needed to walk in hospital room?: A Little Help needed climbing 3-5 steps with a railing? : A Lot 6 Click Score: 18    End of Session Equipment Utilized During Treatment: Oxygen;Gait belt Activity Tolerance: Other (comment) (symptoms orthostatic hypotension, unable to walk) Patient left: in bed;with call bell/phone within reach;with bed alarm set;with family/visitor present (bed in chair position) Nurse Communication: Mobility status;Other (comment) (orthostatic hypotension, vitals flowsheet updated) PT Visit Diagnosis: Muscle weakness (generalized) (M62.81);Difficulty in walking, not elsewhere classified (R26.2);Other abnormalities of gait and mobility (R26.89)     Time: 2440-1027 PT Time Calculation (min) (ACUTE ONLY): 45 min  Charges:  $Therapeutic Exercise: 8-22 mins $Therapeutic  Activity: 23-37 mins                     Shayana Hornstein P., PTA Acute Rehabilitation Services Pager: [phone] Office: [place] 05/09/2020, 11:36 AM

## 2020-05-09 NOTE — Progress Notes (Signed)
Initial Nutrition Assessment  DOCUMENTATION CODES:   Severe malnutrition in context of acute illness/injury  INTERVENTION:   Magic cup TID with meals, each supplement provides 290 kcal and 9 grams of protein  Liberalize diet to Regular; discussed with MD, MD plans to adjust insulin to address blood sugars  Ensure Enlive po BID, each supplement provides 350 kcal and 20 grams of protein  Encourage small, frequent meals including sipping on Ensure throughout the day   NUTRITION DIAGNOSIS:   Severe Malnutrition related to acute illness as evidenced by energy intake < or equal to 50% for > or equal to 5 days,moderate muscle depletion,moderate fat depletion.  GOAL:   Patient will meet greater than or equal to 90% of their needs  MONITOR:   PO intake,Supplement acceptance,Weight trends  REASON FOR ASSESSMENT:   Consult Assessment of nutrition requirement/status  ASSESSMENT:   87 yo male admitted with sepsis related to acute cholecystitis, NSTEMI likely demand ischemia, acute respiratory failure.  PMH includes CAD/CABG, HTN, HLD, DM, CKD IV, CHF   3/18 Acute calculus cholecystitis-Perc Chole drain placed   Recorded po intake 0-30% of meals. Pt endorses very poor appetite currently, nothing tastes good. Pt ate 2-3 bites of country ham biscuit brought in by family this AM. Per RN, pt did eat an entire Magic Cup this AM and drank 2 OJ containers. Observed lunch tray and pt ate part of the roll at that was it.   Prior to admission, pt reporting appetite was "normal." Pt ate 2 good meals per day (late breakfast and dinner meal) and snacks in between. Breakfast was usually 3 pancakes, sausage, cheese, eggs, applesauce. Dinner varies  Pt reports UBW around 197-199 pounds; reported weight loss of 20 pounds recently. 10% wt loss. Current wt 80 kg (177 pounds); weight of 86 kg (190) on 3/22. Net negative 7 L  Pt denies any N/V/abd pain. Perc chole drain in place with bilious output, 200  documented over night.   Pt meet clinical characteristics for severe malnutrition in acute illness but may likely have underlying component of chronic illness.   Labs: CBGs 174-322, sodium 132 (L) Meds: ss novolog with meals and at bedtime, lantus, magic mouthwash   NUTRITION - FOCUSED PHYSICAL EXAM:  Flowsheet Row Most Recent Value  Orbital Region Moderate depletion  Upper Arm Region Mild depletion  Thoracic and Lumbar Region Mild depletion  Buccal Region Moderate depletion  Temple Region Moderate depletion  Clavicle Bone Region Moderate depletion  Clavicle and Acromion Bone Region Moderate depletion  Scapular Bone Region Moderate depletion  Dorsal Hand Moderate depletion  Patellar Region Moderate depletion  Anterior Thigh Region Moderate depletion  Posterior Calf Region Moderate depletion  Edema (RD Assessment) Mild       Diet Order:  Carb Modified  EDUCATION NEEDS:   Not appropriate for education at this time  Skin:  Skin Assessment: Reviewed RN Assessment  Last BM:  3/22  Height:   Ht Readings from Last 1 Encounters:  05/01/20 6' (1.829 m)    Weight:   Wt Readings from Last 1 Encounters:  05/09/20 80.3 kg    BMI:  Body mass index is 24.01 kg/m.  Estimated Nutritional Needs:   Kcal:  1800-2000 kcals  Protein:  95-105 g  Fluid:  >/= 1.8 L    Kerman Passey MS, RDN, LDN, CNSC Registered Dietitian III Clinical Nutrition RD Pager and On-Call Pager Number Located in [place]

## 2020-05-09 NOTE — Consult Note (Signed)
Consultation Note Date: 05/09/2020   Patient Name: Nathan [name]  DOB: 20-Sep-1933  MRN: [id number]  Age / Sex: 87 y.o., male  PCP: Nathan Battles, MD Referring Physician: Geradine Girt, DO  Reason for Consultation: Establishing goals of care  HPI/Patient Profile: 87 y.o. male  with past medical history of skin cancer, CAD s/p CABG, HTN, DM, CKD stage IV, chronic systolic and diastolic heart failure (EF 40-45% in 2020), and cholecystitis presented to the ED on 05/01/20 with complaints of epigastric pain and shortness of breath. Ultrasound with gallbladder sludge and HIDA scan with diffuse and persistent radiotracer activity identified throughout the liver with no signs of biliary activity after 1 hour of imaging. Despite patient being high procedural risk for cardiac event, care team felt [facility] cholecystectomy drain was an absolute necessity due to impending sepsis. Patient had cholecystostomy tube placement 05/02/20. Patient has been slow to improve and is with minimal oral intake.  Clinical Assessment and Goals of Care: I have reviewed medical records including EPIC notes, labs, and imaging. Received report from primary RN - no acute concerns.    Went to visit patient at bedside - daughter/Nathan was present. Patient was lying in bed awake, alert, oriented, and able to participate in conversation. No signs or non-verbal gestures of pain or discomfort noted. No respiratory distress, increased work of breathing, or secretions noted. Patient denies pain. He does endorse a poor appetite and states that there is "no taste to food."  Met with patient and daughter/Nathan  to discuss diagnosis, prognosis, GOC, EOL wishes, disposition, and options.  I introduced Palliative Medicine as specialized medical care for people living with serious illness. It focuses on providing relief from the symptoms and stress of a serious  illness. The goal is to improve quality of life for both the patient and the family.  We discussed a brief life review of the patient as well as functional and nutritional status. 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 He is currently not married and has one Furniture conservator/restorer. Prior to hospitalization, Nathan [name] was living in a private residence alone; however, he is close friends with his neighbor who would "keep an eye on him." He was functionally independent - able to drive, ambulate, dress and bathe himself. Nathan [name] also tells me that he enjoyed golfing and playing cards with his friends - patient values his independence. The patient had a good appetite and was eating and drinking well before hospitalization. Nathan [name] and patient tell me that "things were fine" until last Thursday night, when the patient started to feel nauseated, which worsened throughout the night and subsequently lead to his hospitalization.  We discussed patient's current illness and what it means in the larger context of patient's on-going co-morbidities.  I reviewed that CKD and CHF are progressive, non-curable diseases underlying his current acute medical situation. Patient and Nathan [name] have a clear understanding of the patient's current medical situation. Natural disease trajectory and expectations at EOL were discussed. I attempted to elicit values  and goals of care important to the patient. The difference between aggressive medical intervention and comfort care was considered in light of the patient's goals of care. The patient tells me if he "has a chance" he is motivated and willing to work with PT; however, if his chronic illnesses are at end stages he doesn't see the point in pursing aggressive interventions. We discussed his lab work and progress thus far. Education provided that, currently, his poor PO intake is his biggest prognostic indicator. We discussed what is needed for a positive rehabilitation  experience, which includes adequate nutritional intake - patient expressed understanding. At this time, patient is willing to try PT. We discussed in detail SNF rehab vs home with HHPT - patient is struggling with this decision. Daughter/Nathan is supportive of whichever he chooses - she states she would be able to care for the patient at home. We did discuss additional support such as private duty caregivers - she was interested in gaining more information. I offered time and space for patient to consider options of rehab vs HHPT - answered all questions - patient is not able to decide during my visit. I informed them to call PMT if they reach a decision, otherwise myself or TOC would follow up tomorrow.  Palliative Care services outpatient were explained and offered - patient and Nathan [name] agreeable. We discussed if PO intake remains minimal and patient feels rehabilitation is not providing benefit, he could easily transition to hospice after being connected with outpatient PC. Nathan [name] states that patient has made it clear he does not want aggressive life-prolonging interventions and they would be supportive when that times comes.  Advance directives were considered and discussed. Patient does not have a living will or HCPOA, but states he would want his daughter/Nathan to be his surrogate medical decision maker if ever needed.   Discussed with patient/family the importance of continued conversation with each other and the medical providers regarding overall plan of care and treatment options, ensuring decisions are within the context of the patient's values and GOCs.    Questions and concerns were addressed. The patient/family was encouraged to call with questions and/or concerns. PMT card was provided.   Primary Decision Maker: PATIENT  Daughter/Nathan [name] to be healthcare proxy if needed    SUMMARY OF RECOMMENDATIONS:  Continue current medical treatment  Continue DNR/DNI as previously  documented  Patient is motivated to work with PT if his chronic illnesses are stable. He is struggling to decide between SNF rehab and home with HHPT. Daughter is able to care for him at home if he decides he would like discharge home.  TOC consulted for: outpatient Palliative Care referral and family's request for private duty caregiver information  PMT will continue to follow and support holistically  Code Status/Advance Care Planning:  DNR  Palliative Prophylaxis:   Aspiration, Bowel Regimen, Delirium Protocol, Frequent Pain Assessment, Oral Care and Turn Reposition  Additional Recommendations (Limitations, Scope, Preferences):  Full Scope Treatment and No Tracheostomy  Psycho-social/Spiritual:   Desire for further Chaplaincy support:no Created space and opportunity for patient and family to express thoughts and feelings regarding patient's current medical situation.   Emotional support and therapeutic listening provided.  Prognosis:   Unable to determine  Discharge Planning: To Be Determined      Primary Diagnoses: Present on Admission: . Abdominal pain . Elevated LFTs . Calculus of gallbladder with biliary obstruction but without cholecystitis . Hypercholesteremia . Diabetes mellitus type 2 with complications ([place]) . Essential hypertension .  Stage 4 chronic kidney disease ([place]) . Chronic combined systolic and diastolic CHF (congestive heart failure) ([place])   I have reviewed the medical record, interviewed the patient and family, and examined the patient. The following aspects are pertinent.  Past Medical History:  Diagnosis Date  . Acid reflux   . Aortic insufficiency   . Cholecystitis   . Combined systolic and diastolic heart failure ([place])    Echo 04/2018: inf-lat HK, EF 40-45, severe basal septal hypertrophy, mild conc LVH, Gr 1 DD, severe LAE, mild to mod TR, mod AI, asc Aorta 39 mm  . Coronary artery disease 2002   CABG x5  . Diabetes mellitus   . DVT  (deep venous thrombosis) ([facility])    previously on coumadin  . Hypercholesteremia   . Hyperlipidemia   . Mitral valve regurgitation   . Skin cancer    tumor removed off of his back   Social History   Socioeconomic History  . Marital status: Divorced    Spouse name: Not on file  . Number of children: Not on file  . Years of education: Not on file  . Highest education level: Not on file  Occupational History  . Not on file  Tobacco Use  . Smoking status: Never Smoker  . Smokeless tobacco: Never Used  Vaping Use  . Vaping Use: Never used  Substance and Sexual Activity  . Alcohol use: No  . Drug use: No  . Sexual activity: Not Currently  Other Topics Concern  . Not on file  Social History Narrative  . Not on file   Social Determinants of Health   Financial Resource Strain: Not on file  Food Insecurity: Not on file  Transportation Needs: Not on file  Physical Activity: Not on file  Stress: Not on file  Social Connections: Not on file   Family History  Problem Relation Age of Onset  . Heart attack Father   . Hypertension Mother   . Diabetes Brother   . Cancer Sister        breast  . Cancer Brother        throat & mouth   Scheduled Meds: . amoxicillin-clavulanate  500 mg Oral Q12H  . apixaban  2.5 mg Oral BID  . aspirin EC  81 mg Oral Daily  . furosemide  20 mg Oral Daily  . hydrALAZINE  25 mg Oral Q8H  . insulin aspart  0-15 Units Subcutaneous TID WC  . insulin aspart  0-5 Units Subcutaneous QHS  . insulin glargine  14 Units Subcutaneous Daily  . isosorbide mononitrate  30 mg Oral Daily  . magic mouthwash  5 mL Oral TID  . melatonin  3 mg Oral QHS  . metoprolol succinate  12.5 mg Oral Daily  . pantoprazole  40 mg Oral Daily  . sodium chloride flush  5 mL Intracatheter Q8H  . tamsulosin  0.4 mg Oral QPC supper   Continuous Infusions: . sodium chloride Stopped (05/03/20 0328)   PRN Meds:.sodium chloride, acetaminophen **OR** acetaminophen, albuterol,  camphor-menthol, hydrALAZINE, HYDROcodone-acetaminophen, menthol-cetylpyridinium, metoprolol tartrate, ondansetron **OR** [DISCONTINUED] ondansetron (ZOFRAN) IV, phenol Medications Prior to Admission:  Prior to Admission medications   Medication Sig Start Date End Date Taking? Authorizing Provider  albuterol (VENTOLIN HFA) 108 (90 Base) MCG/ACT inhaler Inhale 1-2 puffs into the lungs every 6 (six) hours as needed for shortness of breath or wheezing. 11/13/19  Yes [provider]  apixaban (ELIQUIS) 2.5 MG TABS tablet Take 2.5 mg by mouth 2 (  two) times daily.   Yes [provider]  atorvastatin (LIPITOR) 40 MG tablet Take 40 mg by mouth daily. 02/02/15  Yes [provider]  Blood Glucose Monitoring Suppl (ONETOUCH VERIO) w/Device KIT  09/06/17  Yes [provider]  carvedilol (COREG) 6.25 MG tablet TAKE 1 TABLET BY MOUTH EVERY DAY Patient taking differently: Take 6.25 mg by mouth daily. 04/01/20  Yes Nahser, Wonda Cheng, MD  fenofibrate (TRICOR) 145 MG tablet Take 1 tablet (145 mg total) by mouth daily. 03/21/20  Yes Nahser, Wonda Cheng, MD  guaifenesin (ROBITUSSIN) 100 MG/5ML syrup Take 200 mg by mouth 3 (three) times daily as needed for cough or congestion.   Yes [provider]  hydrALAZINE (APRESOLINE) 25 MG tablet Take 1 tablet (25 mg total) by mouth 2 (two) times daily. 03/21/20  Yes Nahser, Wonda Cheng, MD  insulin NPH-regular Human (NOVOLIN 70/30) (70-30) 100 UNIT/ML injection Inject 20-35 Units into the skin See admin instructions. 35 units in the morning, 20 units at dinner   Yes [provider]  isosorbide mononitrate (IMDUR) 30 MG 24 hr tablet Take 1 tablet (30 mg total) by mouth daily. 07/10/19  Yes Nahser, Wonda Cheng, MD  Lancets Glory Rosebush ULTRASOFT) lancets  09/06/17  Yes [provider]  Multiple Vitamins-Minerals (CENTRUM SILVER PO) Take 1 tablet by mouth daily.   Yes [provider]  nitroGLYCERIN (NITROSTAT) 0.4 MG SL tablet Place  1 tablet (0.4 mg total) under the tongue every 5 (five) minutes as needed for chest pain. 04/04/18  Yes Weaver, Scott T, PA-C  Omega-3 Fatty Acids (FISH OIL) 1200 MG CAPS Take 1,200 mg by mouth 2 (two) times daily.   Yes [provider]  omeprazole (PRILOSEC) 20 MG capsule Take 20 mg by mouth 2 (two) times daily.  01/06/11  Yes [provider]  ONETOUCH VERIO test strip  09/06/17  Yes [provider]  [place] 1ML/31G 31G X 5/16" 1 ML MISC USE 1 TWICE DAILY AS DIRECTED 09/29/17  Yes [provider]  tamsulosin (FLOMAX) 0.4 MG CAPS capsule Take 0.4 mg by mouth at bedtime. 03/11/20  Yes [provider]  TOLAK 4 % CREA Apply to areas qhs x 3weeks 01/25/20  Yes [provider]   No Known Allergies Review of Systems  Constitutional: Positive for activity change, appetite change and fatigue.  Respiratory: Negative for shortness of breath.   Gastrointestinal: Negative for nausea and vomiting.  Neurological: Positive for weakness.  All other systems reviewed and are negative.   Physical Exam Vitals and nursing note reviewed.  Constitutional:      General: He is not in acute distress.    Appearance: He is ill-appearing.     Comments: Frail appearing  Pulmonary:     Effort: No respiratory distress.  Skin:    General: Skin is warm and dry.  Neurological:     Mental Status: He is alert and oriented to person, place, and time.  Psychiatric:        Attention and Perception: Attention normal.        Behavior: Behavior is cooperative.        Cognition and Memory: Cognition and memory normal.     Vital Signs: BP 129/68   Pulse 100   Temp (!) 97.5 F (36.4 C) (Oral)   Resp 16   Ht 6' (1.829 m)   Wt 80.3 kg   SpO2 91%   BMI 24.01 kg/m  Pain Scale: 0-10 POSS *See Group Information*:  1-Acceptable,Awake and alert Pain Score: 0-No pain   SpO2: SpO2: 91 % O2 Device:SpO2: 91 % O2 Flow Rate: .O2 Flow Rate (L/min): 2  L/min  IO: Intake/output summary:   Intake/Output Summary (Last 24 hours) at 05/09/2020 1125 Last data filed at 05/09/2020 0488 Gross per 24 hour  Intake 250 ml  Output 1250 ml  Net -1000 ml    LBM: Last BM Date: 05/06/20 Baseline Weight: Weight: 89.8 kg Most recent weight: Weight: 80.3 kg     Palliative Assessment/Data: PPS 50%     Time In: 1400 Time Out: 1515 Time Total: 75 minutes  Greater than 50%  of this time was spent counseling and coordinating care related to the above assessment and plan.  Signed by: Lin Landsman, NP   Please contact Palliative Medicine Team phone at [phone] for questions and concerns.  For individual provider: See Shea Evans

## 2020-05-10 DIAGNOSIS — E43 Unspecified severe protein-calorie malnutrition: Secondary | ICD-10-CM

## 2020-05-10 DIAGNOSIS — G4700 Insomnia, unspecified: Secondary | ICD-10-CM

## 2020-05-10 LAB — GLUCOSE, CAPILLARY
Glucose-Capillary: 210 mg/dL — ABNORMAL HIGH (ref 70–99)
Glucose-Capillary: 202 mg/dL — ABNORMAL HIGH (ref 70–99)
Glucose-Capillary: 271 mg/dL — ABNORMAL HIGH (ref 70–99)
Glucose-Capillary: 267 mg/dL — ABNORMAL HIGH (ref 70–99)

## 2020-05-10 MED ORDER — SENNA 8.6 MG PO TABS
1.0000 | ORAL_TABLET | Freq: Every day | ORAL | Status: DC | PRN
  Administered 2020-05-10: 8.6 mg via ORAL
  Filled 2020-05-10: qty 1

## 2020-05-10 MED ORDER — METOPROLOL SUCCINATE ER 25 MG PO TB24
25.0000 mg | ORAL_TABLET | Freq: Every day | ORAL | Status: DC
  Administered 2020-05-11 – 2020-05-12 (×2): 25 mg via ORAL
  Filled 2020-05-10 (×2): qty 1

## 2020-05-10 MED ORDER — GERHARDT'S BUTT CREAM
TOPICAL_CREAM | Freq: Three times a day (TID) | CUTANEOUS | Status: DC | PRN
  Filled 2020-05-10: qty 1

## 2020-05-10 MED ORDER — BISACODYL 10 MG RE SUPP: 10.0000 mg | Freq: Every day | RECTAL | Status: DC | PRN

## 2020-05-10 MED ORDER — MIRTAZAPINE 7.5 MG PO TABS
7.5000 mg | ORAL_TABLET | Freq: Every day | ORAL | Status: DC
  Administered 2020-05-10 – 2020-05-11 (×2): 7.5 mg via ORAL
  Filled 2020-05-10 (×2): qty 1

## 2020-05-10 MED ORDER — INSULIN GLARGINE 100 UNIT/ML ~~LOC~~ SOLN
20.0000 [IU] | Freq: Every day | SUBCUTANEOUS | Status: DC
  Administered 2020-05-10 – 2020-05-12 (×3): 20 [IU] via SUBCUTANEOUS
  Filled 2020-05-10 (×3): qty 0.2

## 2020-05-10 NOTE — TOC Progression Note (Signed)
Transition of Care [facility]) - Progression Note    Patient Details  Name: Nathan [name] MRN: [id number] Date of Birth: Oct 23, 1933  [facility] CM/SW Contact  Zenon Mayo, RN Phone Number: 05/10/2020, 5:08 PM  Clinical Narrative:    NCM spoke with daughter, she states they are going to go with HHPT , no preference of agency and outpatient palliative services with [facility]. NCM please make referrals.         Expected Discharge Plan and Services                                                 Social Determinants of Health (SDOH) Interventions    Readmission Risk Interventions No flowsheet data found.

## 2020-05-10 NOTE — Progress Notes (Signed)
Progress Note    Nathan [name]  MPN:[id number] DOB: [birth year]  DOA: 05/01/2020 PCP: Leanna Battles, MD    Brief Narrative:    Medical records reviewed and are as summarized below:  Nathan [name] is an 87 y.o. male with a history of CAD status post CABG, hypertension, hyperlipidemia, type 2 diabetes mellitus, CKD stage IV, and chronic combined heart failure now presenting with cholecystitis and demand ischemia.  Slow to improved, not eating much.    Assessment/Plan:   Principal Problem:   Abdominal pain Active Problems:   Hypercholesteremia   Diabetes mellitus type 2 with complications (HCC)   Essential hypertension   Chronic combined systolic and diastolic CHF (congestive heart failure) (HCC)   Stage 4 chronic kidney disease (HCC)   Calculus of gallbladder with biliary obstruction but without cholecystitis   Elevated LFTs   Protein-calorie malnutrition, severe   Severe gram-negative sepsis (E coli/Kleb pna)) on admission with multiorgan dysfunction syndrome secondary to acute cholecystitis -Much improved-saline lock IV-sepsis physiology has resolved -as he was a Poor lap chole candidate, IR Placed perc drain-IR: Will plan for routine exchange in 8-12 weeks             -Augmentin 3/22  Acute hypoxic respiratory failure  -not on at home  -2L at rest and 4L at home (<87% on room air and more work of breathing when O2 removed)  -DG chest unrevealing other than poor inspiratory status  NSTEMI on admission  -likely demand ischemia -cards consult appreciated: ASA plus eliquis  AKI on CKD 4 -baseline appears to be 2-3 (2.4 in 5/21)  DM type 2 -SSI- increased to regular from very sensitive -increased lantus  Orthostatic hypotension -? Volume vs DM neuropathy -TED hose/abdominal binder  Nutrition Status: Nutrition Problem: Severe Malnutrition Etiology: acute illness Signs/Symptoms: energy intake < or equal to 50% for > or equal to 5 days,moderate muscle  depletion,moderate fat depletion Interventions: Ensure Enlive (each supplement provides 350kcal and 20 grams of protein),Magic cup,Liberalize Diet  FTT -not eating much -palliative care consult  Family Communication/Anticipated D/C date and plan/Code Status   DVT prophylaxis: eliquis Code Status: DNR Disposition Plan: Status is: Inpatient daughter 3/23 Remains inpatient appropriate because:Inpatient level of care appropriate due to severity of illness   Dispo: The patient is from: Home              Anticipated d/c is to: Home              Patient currently is not medically stable to d/c.   Difficult to place patient No     Medical Consultants:   Cards IR Palliative for GOC    Subjective:   Still not wanting to eat -reported episode of vomiting today  Objective:    Vitals:   05/10/20 0311 05/10/20 0532 05/10/20 0756 05/10/20 1040  BP: 122/83  117/73 128/81  Pulse: 100  (!) 105 100  Resp: (!) 24  16 20   Temp: 97.7 F (36.5 C)  97.9 F (36.6 C) (!) 97.4 F (36.3 C)  TempSrc: Oral  Oral Oral  SpO2: 94%  93% 92%  Weight:  80.1 kg    Height:        Intake/Output Summary (Last 24 hours) at 05/10/2020 1158 Last data filed at 05/10/2020 1100 Gross per 24 hour  Intake 245 ml  Output 549 ml  Net -304 ml   Filed Weights   05/08/20 0400 05/09/20 0432 05/10/20 0532  Weight: 80.6 kg 80.3 kg 80.1 kg  Exam:  General: Appearance:    elderly male in no acute distress     Lungs:     Crespirations unlabored  Heart:    Tachycardic. Normal rhythm.    MS:   All extremities are intact.   Neurologic:   Awake, alert, cooperative    Data Reviewed:   I have personally reviewed following labs and imaging studies:  Labs: Labs show the following:   Basic Metabolic Panel: Recent Labs  Lab 05/04/20 0137 05/05/20 0129 05/06/20 0126 05/07/20 0043 05/08/20 0336 05/09/20 0123  NA 136 138 138 134* 135 132*  K 3.3* 3.7 4.1 4.9 5.1 4.7  CL 103 105 106 104 106  101  CO2 23 22 24  20* 19* 21*  GLUCOSE 250* 177* 136* 221* 217* 248*  BUN 70* 61* 50* 42* 41* 50*  CREATININE 3.67* 3.09* 2.78* 2.53* 2.72* 2.96*  CALCIUM 7.0* 7.3* 8.1* 9.0 9.3 9.4  MG 1.8 1.9 1.9  --   --   --    GFR Estimated Creatinine Clearance: 20.4 mL/min (A) (by C-G formula based on SCr of 2.96 mg/dL (H)). Liver Function Tests: Recent Labs  Lab 05/04/20 0137 05/05/20 0129 05/06/20 0126 05/07/20 0043 05/08/20 0336  AST 310* 178* 108* 68* 34  ALT 256* 221* 175* 140* 93*  ALKPHOS 30* 37* 48 63 61  BILITOT 2.1* 2.4* 2.0* 2.0* 1.8*  PROT 5.3* 5.9* 6.2* 6.9 6.7  ALBUMIN 2.1* 2.3* 2.5* 2.7* 2.5*   No results for input(s): LIPASE, AMYLASE in the last 168 hours. No results for input(s): AMMONIA in the last 168 hours. Coagulation profile No results for input(s): INR, PROTIME in the last 168 hours.  CBC: Recent Labs  Lab 05/05/20 0129 05/06/20 0126 05/07/20 0043 05/08/20 0108 05/09/20 0123  WBC 16.4* 15.2* 15.3* 6.6 15.1*  NEUTROABS  --   --  10.3* 4.1 11.3*  HGB 11.6* 12.3* 14.4 12.3* 13.8  HCT 33.3* 36.5* 41.0 36.2* 41.5  MCV 88.3 88.6 87.2 92.6 89.4  PLT 135* 172 181 183 233   Cardiac Enzymes: No results for input(s): CKTOTAL, CKMB, CKMBINDEX, TROPONINI in the last 168 hours. BNP (last 3 results) No results for input(s): PROBNP in the last 8760 hours. CBG: Recent Labs  Lab 05/09/20 1140 05/09/20 1638 05/09/20 2107 05/10/20 0625 05/10/20 1038  GLUCAP 275* 312* 247* 210* 202*   D-Dimer: No results for input(s): DDIMER in the last 72 hours. Hgb A1c: No results for input(s): HGBA1C in the last 72 hours. Lipid Profile: No results for input(s): CHOL, HDL, LDLCALC, TRIG, CHOLHDL, LDLDIRECT in the last 72 hours. Thyroid function studies: No results for input(s): TSH, T4TOTAL, T3FREE, THYROIDAB in the last 72 hours.  Invalid input(s): FREET3 Anemia work up: Recent Labs    05/09/20 0123  VITAMINB12 1,416*   Sepsis Labs: Recent Labs  Lab  05/06/20 0126 05/07/20 0043 05/08/20 0108 05/09/20 0123  WBC 15.2* 15.3* 6.6 15.1*    Microbiology Recent Results (from the past 240 hour(s))  Resp Panel by RT-PCR (Flu A&B, Covid) Nasopharyngeal Swab     Status: None   Collection Time: 05/01/20  4:40 AM   Specimen: Nasopharyngeal Swab; Nasopharyngeal(NP) swabs in vial transport medium  Result Value Ref Range Status   SARS Coronavirus 2 by RT PCR NEGATIVE NEGATIVE Final    Comment: (NOTE) SARS-CoV-2 target nucleic acids are NOT DETECTED.  The SARS-CoV-2 RNA is generally detectable in upper respiratory specimens during the acute phase of infection. The lowest concentration of SARS-CoV-2 viral copies this assay  can detect is 138 copies/mL. A negative result does not preclude SARS-Cov-2 infection and should not be used as the sole basis for treatment or other patient management decisions. A negative result may occur with  improper specimen collection/handling, submission of specimen other than nasopharyngeal swab, presence of viral mutation(s) within the areas targeted by this assay, and inadequate number of viral copies(<138 copies/mL). A negative result must be combined with clinical observations, patient history, and epidemiological information. The expected result is Negative.  Fact Sheet for Patients:  EntrepreneurPulse.com.au  Fact Sheet for Healthcare Providers:  IncredibleEmployment.be  This test is no t yet approved or cleared by the [place] FDA and  has been authorized for detection and/or diagnosis of SARS-CoV-2 by FDA under an Emergency Use Authorization (EUA). This EUA will remain  in effect (meaning this test can be used) for the duration of the COVID-19 declaration under Section 564(b)(1) of the Act, 21 U.S.C.section 360bbb-3(b)(1), unless the authorization is terminated  or revoked sooner.       Influenza A by PCR NEGATIVE NEGATIVE Final   Influenza B by PCR  NEGATIVE NEGATIVE Final    Comment: (NOTE) The Xpert Xpress SARS-CoV-2/FLU/RSV plus assay is intended as an aid in the diagnosis of influenza from Nasopharyngeal swab specimens and should not be used as a sole basis for treatment. Nasal washings and aspirates are unacceptable for Xpert Xpress SARS-CoV-2/FLU/RSV testing.  Fact Sheet for Patients: EntrepreneurPulse.com.au  Fact Sheet for Healthcare Providers: IncredibleEmployment.be  This test is not yet approved or cleared by the [place] FDA and has been authorized for detection and/or diagnosis of SARS-CoV-2 by FDA under an Emergency Use Authorization (EUA). This EUA will remain in effect (meaning this test can be used) for the duration of the COVID-19 declaration under Section 564(b)(1) of the Act, 21 U.S.C. section 360bbb-3(b)(1), unless the authorization is terminated or revoked.  Performed at [facility], [place] [address] [place], [place] [address]   MRSA PCR Screening     Status: None   Collection Time: 05/01/20  6:56 PM   Specimen: Nasopharyngeal  Result Value Ref Range Status   MRSA by PCR NEGATIVE NEGATIVE Final    Comment:        The GeneXpert MRSA Assay (FDA approved for NASAL specimens only), is one component of a comprehensive MRSA colonization surveillance program. It is not intended to diagnose MRSA infection nor to guide or monitor treatment for MRSA infections. Performed at [facility], [place] [address] [place], [place] [address]   Aerobic/Anaerobic Culture (surgical/deep wound)     Status: None   Collection Time: 05/02/20 11:13 AM   Specimen: BILE  Result Value Ref Range Status   Specimen Description BILE  Final   Special Requests NONE  Final   Gram Stain   Final    RARE WBC PRESENT, PREDOMINANTLY PMN ABUNDANT GRAM VARIABLE ROD MODERATE GRAM POSITIVE COCCI    Culture   Final    ABUNDANT KLEBSIELLA PNEUMONIAE MODERATE ESCHERICHIA COLI FEW  ENTEROCOCCUS FAECALIS NO ANAEROBES ISOLATED Performed at [facility], [address] [place], [place] [address]    Report Status 05/09/2020 FINAL  Final   Organism ID, Bacteria KLEBSIELLA PNEUMONIAE  Final   Organism ID, Bacteria ESCHERICHIA COLI  Final   Organism ID, Bacteria ENTEROCOCCUS FAECALIS  Final      Susceptibility   Escherichia coli - MIC*    AMPICILLIN <=2 SENSITIVE Sensitive     CEFAZOLIN <=4 SENSITIVE Sensitive  CEFEPIME <=0.12 SENSITIVE Sensitive     CEFTAZIDIME <=1 SENSITIVE Sensitive     CEFTRIAXONE <=0.25 SENSITIVE Sensitive     CIPROFLOXACIN <=0.25 SENSITIVE Sensitive     GENTAMICIN <=1 SENSITIVE Sensitive     IMIPENEM <=0.25 SENSITIVE Sensitive     TRIMETH/SULFA <=20 SENSITIVE Sensitive     AMPICILLIN/SULBACTAM <=2 SENSITIVE Sensitive     PIP/TAZO <=4 SENSITIVE Sensitive     * MODERATE ESCHERICHIA COLI   Enterococcus faecalis - MIC*    AMPICILLIN <=2 SENSITIVE Sensitive     VANCOMYCIN <=0.5 SENSITIVE Sensitive     GENTAMICIN SYNERGY SENSITIVE Sensitive     * FEW ENTEROCOCCUS FAECALIS   Klebsiella pneumoniae - MIC*    AMPICILLIN >=32 RESISTANT Resistant     CEFAZOLIN <=4 SENSITIVE Sensitive     CEFEPIME <=0.12 SENSITIVE Sensitive     CEFTAZIDIME <=1 SENSITIVE Sensitive     CEFTRIAXONE <=0.25 SENSITIVE Sensitive     CIPROFLOXACIN <=0.25 SENSITIVE Sensitive     GENTAMICIN <=1 SENSITIVE Sensitive     IMIPENEM <=0.25 SENSITIVE Sensitive     TRIMETH/SULFA <=20 SENSITIVE Sensitive     AMPICILLIN/SULBACTAM 8 SENSITIVE Sensitive     PIP/TAZO <=4 SENSITIVE Sensitive     * ABUNDANT KLEBSIELLA PNEUMONIAE    Procedures and diagnostic studies:  No results found.  Medications:   . amoxicillin-clavulanate  500 mg Oral Q12H  . apixaban  2.5 mg Oral BID  . aspirin EC  81 mg Oral Daily  . feeding supplement  237 mL Oral BID BM  . hydrALAZINE  25 mg Oral Q8H  . insulin aspart  0-15 Units Subcutaneous TID WC  . insulin aspart  0-5 Units Subcutaneous QHS   . insulin glargine  20 Units Subcutaneous Daily  . isosorbide mononitrate  30 mg Oral Daily  . melatonin  3 mg Oral QHS  . metoprolol succinate  12.5 mg Oral Daily  . pantoprazole  40 mg Oral Daily  . sodium chloride flush  5 mL Intracatheter Q8H  . tamsulosin  0.4 mg Oral QPC supper   Continuous Infusions: . sodium chloride Stopped (05/03/20 0328)     LOS: 9 days   Geradine Girt  [facility]   How to contact the [facility] Attending or Consulting provider [place] or covering provider during after hours [place], for this patient?  1. Check the care team in [facility] and look for a) attending/consulting TR[facility] provider listed and b) the [facility] team listed 2. Log into [email or web address] and use 's universal password to access. If you do not have the password, please contact the hospital operator. 3. Locate the [facility] provider you are looking for under [facility] and page to a number that you can be directly reached. 4. If you still have difficulty reaching the provider, please page the [facility] (Director on Call) for the Hospitalists listed on amion for assistance.  05/10/2020, 11:58 AM

## 2020-05-10 NOTE — Progress Notes (Signed)
Daily Progress Note   Patient Name: Nathan [name]       Date: 05/10/2020 DOB: 25-Jan-1934  Age: 87 y.o. MRN#: [id number] Attending Physician: Geradine Girt, DO Primary Care Physician: Leanna Battles, MD Admit Date: 05/01/2020  Reason for Consultation/Follow-up: Establishing goals of care  Subjective: Chart review performed. Received report from primary RN - no acute concerns. RN reports patient had episode of N/V this morning that was improved with antiemetic.   Went to visit patient at bedside - no family/visitors present. Patient was lying in bed awake, alert, oriented, and able to participate in conversation. He was finishing lunch when I arrived - he had eaten about 90% of his PB&J sandwich with few bites taken from magic cup. He endorses drinking two cups of water today. He denies pain or shortness of breath. No signs or non-verbal gestures of pain or discomfort noted. No respiratory distress, increased work of breathing, or secretions noted. He is on acute 1L O2 [place].  Mr. Matera and I discussed again today PT vs hospice in context of his oral intake. Patient states that he still does not have much of an appetite, but he is "pushing it down." He states his nausea improved with medication this morning which has allowed him to eat lunch. We discussed scheduling antiemetic around meal times; however, he states he does not feel nauseated often, it's generally just lack of appetite. We discussed trialling an appetite stimulant - all questions answered - he was agreeable to start. Mr. Moody also endorses insomnia. We discussed starting mirtazapine which can possibly help with both appetite and insomnia - he is appreciative. Patient still states that if his chronic conditions are stable and his appetite  can improve, he would want to try PT at rehab vs [facility]. However, he understands that if his appetite does not improve, rehab will likely not provide much benefit as it is anticipated he will continue to decline. Mr. 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   All questions and concerns addressed. Encouraged to call with questions and/or concerns. PMT card previously provided.  Length of Stay: 9  Current Medications: Scheduled Meds:  amoxicillin-clavulanate  500 mg Oral Q12H   apixaban  2.5 mg Oral BID   aspirin EC  81 mg Oral Daily   feeding supplement  237 mL Oral BID BM   hydrALAZINE  25 mg Oral Q8H   insulin aspart  0-15 Units Subcutaneous TID WC   insulin aspart  0-5 Units Subcutaneous QHS   insulin glargine  20 Units Subcutaneous Daily   isosorbide mononitrate  30 mg Oral Daily   melatonin  3 mg Oral QHS   [START ON 05/11/2020] metoprolol succinate  25 mg Oral Daily   pantoprazole  40 mg Oral Daily   sodium chloride flush  5 mL Intracatheter Q8H   tamsulosin  0.4 mg Oral QPC supper    Continuous Infusions:  sodium chloride Stopped (05/03/20 0328)    PRN Meds: sodium chloride, acetaminophen **OR** acetaminophen, albuterol, bisacodyl, camphor-menthol, hydrALAZINE, HYDROcodone-acetaminophen, menthol-cetylpyridinium, ondansetron **OR** [DISCONTINUED] ondansetron (ZOFRAN) IV, phenol, senna  Physical Exam Vitals and nursing note reviewed.  Constitutional:      General: He is not in acute distress.    Appearance: He is ill-appearing.  Pulmonary:     Effort: No respiratory distress.  Skin:    General: Skin is warm and dry.  Neurological:     Mental Status: He is alert and oriented to person, place, and time.     Motor: Weakness present.  Psychiatric:        Attention and  Perception: Attention normal.        Behavior: Behavior is cooperative.        Cognition and Memory: Cognition and memory normal.             Vital Signs: BP 128/81 (BP Location: Right Arm)    Pulse 100    Temp (!) 97.4 F (36.3 C) (Oral)    Resp 20    Ht 6' (1.829 m)    Wt 80.1 kg    SpO2 92%    BMI 23.95 kg/m  SpO2: SpO2: 92 % O2 Device: O2 Device: Nasal Cannula O2 Flow Rate: O2 Flow Rate (L/min): 1 L/min  Intake/output summary:   Intake/Output Summary (Last 24 hours) at 05/10/2020 1208 Last data filed at 05/10/2020 1100 Gross per 24 hour  Intake 245 ml  Output 549 ml  Net -304 ml   LBM: Last BM Date: 05/08/20 Baseline Weight: Weight: 89.8 kg Most recent weight: Weight: 80.1 kg       Palliative Assessment/Data: PPS 50%      Patient Active Problem List   Diagnosis Date Noted   Protein-calorie malnutrition, severe 05/10/2020   Abdominal pain 05/01/2020   Calculus of gallbladder with biliary obstruction but without cholecystitis    Elevated LFTs    Aortic stenosis 03/26/2019   Chronic venous insufficiency 12/12/2018   Pain due to onychomycosis of toenails of both feet 08/11/2018   Coagulation disorder ([place]) 08/11/2018   Chronic combined systolic and diastolic CHF (congestive heart failure) ([place]) 05/11/2018   Stage 4 chronic kidney disease ([place]) 05/11/2018   Essential hypertension 11/11/2016   Pneumonia 01/23/2015   Hypoxia 01/22/2015   Elevated troponin 01/22/2015   Acute cholecystitis 01/21/2015   S/P CABG (coronary artery bypass graft) 01/21/2015   Diabetes mellitus type 2 with complications ([place]) 54/62/7035   Sepsis ([place])    Valvular heart disease 09/29/2011   Syncope 01/21/2011   Coronary artery disease    Aortic insufficiency    Hypercholesteremia    Mitral valve regurgitation     Palliative Care Assessment &  Plan   Patient Profile: 87 y.o. male  with past medical history of skin cancer, CAD s/p CABG, HTN, DM, CKD stage IV,  chronic systolic and diastolic heart failure (EF 40-45% in2020), and cholecystitis presented to the ED on 05/01/20 with complaints of epigastric pain and shortness of breath. Ultrasound with gallbladder sludge and HIDA scan with diffuse and persistent radiotracer activity identified throughout the liver with no signs of biliary activity after 1 hour of imaging. Despite patient being high procedural risk for cardiac event, care team felt [facility] cholecystectomy drain was an absolute necessity due to impending sepsis. Patient had cholecystostomy tube placement 05/02/20. Patient has been slow to improve and is with minimal oral intake.  Assessment: Severe sepsis Multiorgan dysfunction syndrome Acute cholecystitis Acute hypoxic respiratory failure NSTEMI AKI on CKD stage IV Orthostatic hypotension Severe malnutrition  Recommendations/Plan:  Continue current medical treatment with watchful waiting  Continue DNR/DNI as previously documented  Patient understands to have successful rehabilitation experience his nutritional/oral intake must improve; otherwise, we would anticipate continual decline and hospice would be an appropriate next step. Patient is trying to eat and is willing to work with PT at this time but may decide on hospice pending clinical course  Will start mirtazapine 7.5mg  daily at bedtime to see if helps with insomnia and appetite stimulation  Ongoing [place] conversations pending clinical course  PMT will continue to follow and support holistically  Goals of Care and Additional Recommendations:  Limitations on Scope of Treatment: Full Scope Treatment and No Tracheostomy  Code Status:    Code Status Orders  (From admission, onward)         Start     Ordered   05/01/20 1025  Do not attempt resuscitation (DNR)  Continuous       Question Answer Comment  In the event of cardiac or respiratory ARREST Do not call a code blue   In the event of cardiac or respiratory ARREST Do  not perform Intubation, CPR, defibrillation or ACLS   In the event of cardiac or respiratory ARREST Use medication by any route, position, wound care, and other measures to relive pain and suffering. May use oxygen, suction and manual treatment of airway obstruction as needed for comfort.      05/01/20 1028        Code Status History    Date Active Date Inactive Code Status Order ID Comments User Context   04/06/2018 1304 04/06/2018 1831 Full Code [id number]  Lorretta Harp, MD Inpatient   01/21/2015 0717 01/27/2015 1917 Full Code [id number]  Norval Morton, MD Inpatient   Advance Care Planning Activity       Prognosis:   Unable to determine  Discharge Planning:  To Be Determined  Care plan was discussed with primary RN, patient  Thank you for allowing the Palliative Medicine Team to assist in the care of this patient.   Total Time 35 minutes Prolonged Time Billed  no       Greater than 50%  of this time was spent counseling and coordinating care related to the above assessment and plan.  Lin Landsman, NP  Please contact Palliative Medicine Team phone at [phone] for questions and concerns.

## 2020-05-10 NOTE — Progress Notes (Signed)
Physical Therapy Treatment Patient Details Name: Nathan [name] MRN: [id number] DOB: 08-30-1933 Today's Date: 05/10/2020    History of Present Illness Pt is an 87 y/o male admitted secondary to increased abdominal pain on 3/17. Found to have cholelithiasis and is s/p percutaneous cholecystostomy on 3/18. Pt also found to have NSTEMI. PMH includes DM, HTN, CHF, CKD, and CAD s/p CABG.    PT Comments    Pt progressing slowly toward goals, mostly due to orthostatics.  Emphasis on taking orthostatics, completing transitions, sit to stands, sitting and standing exercise and finally when deemed pt's BP were maintained well enough, gait in the halls for stability and stamina.    Follow Up Recommendations  Home health PT;Supervision for mobility/OOB     Equipment Recommendations  3in1 (PT)    Recommendations for Other Services       Precautions / Restrictions Precautions Precautions: Fall Precaution Comments: percutaneous drain on R.    Mobility  Bed Mobility Overal bed mobility: Needs Assistance Bed Mobility: Supine to Sit;Sit to Supine     Supine to sit: Supervision Sit to supine: Supervision   General bed mobility comments: increased time    Transfers Overall transfer level: Needs assistance Equipment used: Rolling walker (2 wheeled) Transfers: Sit to/from Stand Sit to Stand: Min guard         General transfer comment: cues for hand placement  Ambulation/Gait Ambulation/Gait assistance: Min guard Gait Distance (Feet): 150 Feet Assistive device: Rolling walker (2 wheeled) Gait Pattern/deviations: Step-through pattern   Gait velocity interpretation: <1.8 ft/sec, indicate of risk for recurrent falls General Gait Details: overall guard due to soft/orthostatic BP's,  pt reporting light-headed, but not so bad as to refrain from going into the hall with the RW.  Flexed posture, but generally steady with the RW.  Sats on RA with activity  89-90%, 92-94% on 1L,  HR in the  90's with activity. see comments for BP/orthostatices.   Stairs             Wheelchair Mobility    Modified Rankin (Stroke Patients Only)       Balance     Sitting balance-[name]: Good       Standing balance-[name]: Fair Standing balance comment: prefers/safer with use of RW due to dizziness.                            Cognition Arousal/Alertness: Awake/alert Behavior During Therapy: WFL for tasks assessed/performed Overall Cognitive Status: Within Functional Limits for tasks assessed                                        Exercises General Exercises - Lower Extremity Hip ABduction/ADduction: AROM;Both;10 reps;Standing Hip Flexion/Marching: AROM;Both;10 reps;Seated Mini-Sqauts: 10 reps;Standing;AROM    General Comments General comments (skin integrity, edema, etc.): Orthostatics:  Supine  124/83,  sitting 105/72, sitting after activity  104/70,  standing  79/60, standing after abd exercise, 97/70 after mini squats, 94/61 after ambulation in the hall      Pertinent Vitals/Pain Pain Assessment: Faces Faces Pain Scale: Hurts a little bit Pain Location: general/abdomen Pain Descriptors / Indicators: Aching Pain Intervention(s): Monitored during session    Home Living                      Prior Function  PT Goals (current goals can now be found in the care plan section) Acute Rehab PT Goals Patient Stated Goal: to feel better PT Goal Formulation: With patient Time For Goal Achievement: 05/18/20 Potential to Achieve Goals: Fair Progress towards PT goals: Progressing toward goals    Frequency    Min 3X/week      PT Plan Current plan remains appropriate    Co-evaluation              AM-PAC PT "6 Clicks" Mobility   Outcome Measure  Help needed turning from your back to your side while in a flat bed without using bedrails?: None Help needed moving from lying on your back to sitting  on the side of a flat bed without using bedrails?: A Little Help needed moving to and from a bed to a chair (including a wheelchair)?: A Little Help needed standing up from a chair using your arms (e.g., wheelchair or bedside chair)?: A Little Help needed to walk in hospital room?: A Little Help needed climbing 3-5 steps with a railing? : A Little 6 Click Score: 19    End of Session Equipment Utilized During Treatment: Oxygen Activity Tolerance: Patient tolerated treatment well (varying levels of light-headedness) Patient left: in bed;with call bell/phone within reach;with bed alarm set;with family/visitor present Nurse Communication: Mobility status;Other (comment) (Orthostatics) PT Visit Diagnosis: Muscle weakness (generalized) (M62.81);Difficulty in walking, not elsewhere classified (R26.2);Other abnormalities of gait and mobility (R26.89)     Time: 1402-1430 PT Time Calculation (min) (ACUTE ONLY): 28 min  Charges:  $Gait Training: 8-22 mins $Therapeutic Activity: 8-22 mins                     05/10/2020  Ginger Carne., PT Acute Rehabilitation Services [phone]  (pager) [phone]  (office)   Tessie Fass Ananiah Maciolek 05/10/2020, 3:04 PM

## 2020-05-11 DIAGNOSIS — R0902 Hypoxemia: Secondary | ICD-10-CM

## 2020-05-11 LAB — BASIC METABOLIC PANEL
Anion gap: 10 (ref 5–15)
BUN: 57 mg/dL — ABNORMAL HIGH (ref 8–23)
CO2: 21 mmol/L — ABNORMAL LOW (ref 22–32)
Calcium: 9.4 mg/dL (ref 8.9–10.3)
Chloride: 103 mmol/L (ref 98–111)
Creatinine, Ser: 2.88 mg/dL — ABNORMAL HIGH (ref 0.61–1.24)
GFR, Estimated: 21 mL/min — ABNORMAL LOW (ref >60)
Glucose, Bld: 185 mg/dL — ABNORMAL HIGH (ref 70–99)
Potassium: 4.6 mmol/L (ref 3.5–5.1)
Sodium: 134 mmol/L — ABNORMAL LOW (ref 135–145)

## 2020-05-11 LAB — CBC
HCT: 39.4 % (ref 39.0–52.0)
Hemoglobin: 13.5 g/dL (ref 13.0–17.0)
MCH: 30.4 pg (ref 26.0–34.0)
MCHC: 34.3 g/dL (ref 30.0–36.0)
MCV: 88.7 fL (ref 80.0–100.0)
Platelets: 273 10*3/uL (ref 150–400)
RBC: 4.44 MIL/uL (ref 4.22–5.81)
RDW: 13.7 % (ref 11.5–15.5)
WBC: 15.3 10*3/uL — ABNORMAL HIGH (ref 4.0–10.5)
nRBC: 0 % (ref 0.0–0.2)

## 2020-05-11 LAB — GLUCOSE, CAPILLARY
Glucose-Capillary: 178 mg/dL — ABNORMAL HIGH (ref 70–99)
Glucose-Capillary: 180 mg/dL — ABNORMAL HIGH (ref 70–99)
Glucose-Capillary: 194 mg/dL — ABNORMAL HIGH (ref 70–99)
Glucose-Capillary: 149 mg/dL — ABNORMAL HIGH (ref 70–99)

## 2020-05-11 MED ORDER — HYDRALAZINE HCL 10 MG PO TABS
10.0000 mg | ORAL_TABLET | Freq: Three times a day (TID) | ORAL | Status: DC
  Administered 2020-05-11 – 2020-05-12 (×3): 10 mg via ORAL
  Filled 2020-05-11 (×4): qty 1

## 2020-05-11 NOTE — Progress Notes (Signed)
Daily Progress Note   Patient Name: Nathan [name]       Date: 05/11/2020 DOB: 12-29-1933  Age: 87 y.o. MRN#: [id number] Attending Physician: Geradine Girt, DO Primary Care Physician: Leanna Battles, MD Admit Date: 05/01/2020  Reason for Consultation/Follow-up: Establishing goals of care  Subjective: Chart review performed. Received report from primary RN - no acute concerns. RN reports patient is eating a little better today but remains lethargic.   Went to visit patient at bedside - no family/visitors present. Patient was asleep but does awake to voice/gentle touch - he is alert, oriented, and able to participate in conversation. No signs or non-verbal gestures of pain or discomfort noted. No respiratory distress, increased work of breathing, or secretions noted. Patient denies pain or shortness of breath; states he feels "fair."  Patient states he was able to sleep better last night but had "crazy dreams." Patient also states he has been able to eat today; however, still does not have much of an appetite. We discussed that mirtazapine can take several days to take effect. We also discussed his goal of rehab vs home with HHPT - patient states he and his daughter have decided for home with HHPT. The patient will go to live with his daughter. Patient understands that if HHPT is not beneficial, outpatient Palliative Care services following him can easily transition him to hospice if that is his desire. He is clear that his goal is to not be rehospitalized if he declines, but to be kept comfortable at home with hospice.  Introduced, reviewed, and completed (but not finalized) MOST form. Patient would like to wait and sign when his daughter is present tomorrow. MOST form completed (but not finalized)  as outlined under recommendation section below.   All questions and concerns addressed. Encouraged to call with questions and/or concerns. PMT card previously provided.  After meeting with patient, called his daughter/Nathan [name] to provide updates and discuss [place]. Nathan [name] is agreeable patient will discharge to her house with HHPT and outpatient Palliative Care. Nathan [name] feels physically patient would do better at rehab facility, but mentally would do better at her house, which ultimately is why they decided for HHPT. I discussed starting mirtazapine for patient yesterday and educated on indications of insomnia/appetite stimulant/and depression - we discussed that it can take several days to see full  effects, but if none were seen, dose could be increased. Nathan [name] expressed understanding and appreciation. I updated Nathan [name] on discussion with patient around MOST form and choices he's made thus far - she is supportive and understands his goal is to get completed tomorrow with her before discharge.  All questions and concerns addressed. Encouraged to call with questions and/or concerns. PMT card previously provided.   Length of Stay: 10  Current Medications: Scheduled Meds:  . amoxicillin-clavulanate  500 mg Oral Q12H  . apixaban  2.5 mg Oral BID  . aspirin EC  81 mg Oral Daily  . feeding supplement  237 mL Oral BID BM  . hydrALAZINE  10 mg Oral Q8H  . insulin aspart  0-15 Units Subcutaneous TID WC  . insulin aspart  0-5 Units Subcutaneous QHS  . insulin glargine  20 Units Subcutaneous Daily  . isosorbide mononitrate  30 mg Oral Daily  . melatonin  3 mg Oral QHS  . metoprolol succinate  25 mg Oral Daily  . mirtazapine  7.5 mg Oral QHS  . pantoprazole  40 mg Oral Daily  . sodium chloride flush  5 mL Intracatheter Q8H  . tamsulosin  0.4 mg Oral QPC supper    Continuous Infusions: . sodium chloride Stopped (05/03/20 0328)    PRN Meds: sodium chloride, acetaminophen **OR** acetaminophen, albuterol,  bisacodyl, camphor-menthol, Gerhardt's butt cream, hydrALAZINE, HYDROcodone-acetaminophen, menthol-cetylpyridinium, ondansetron **OR** [DISCONTINUED] ondansetron (ZOFRAN) IV, phenol, senna  Physical Exam Vitals and nursing note reviewed.  Constitutional:      General: He is not in acute distress.    Appearance: He is ill-appearing.  Pulmonary:     Effort: No respiratory distress.  Skin:    General: Skin is warm and dry.  Neurological:     Mental Status: He is alert and oriented to person, place, and time.     Motor: Weakness present.  Psychiatric:        Attention and Perception: Attention normal.        Behavior: Behavior is cooperative.        Cognition and Memory: Cognition and memory normal.             Vital Signs: BP 132/75 (BP Location: Right Arm)   Pulse 100   Temp 98 F (36.7 C) (Oral)   Resp 16   Ht 6' (1.829 m)   Wt 80.1 kg   SpO2 92%   BMI 23.95 kg/m  SpO2: SpO2: 92 % O2 Device: O2 Device: Nasal Cannula O2 Flow Rate: O2 Flow Rate (L/min): 1 L/min  Intake/output summary:   Intake/Output Summary (Last 24 hours) at 05/11/2020 1419 Last data filed at 05/11/2020 1400 Gross per 24 hour  Intake 365 ml  Output 675 ml  Net -310 ml   LBM: Last BM Date: 05/08/20 Baseline Weight: Weight: 89.8 kg Most recent weight: Weight: 80.1 kg       Palliative Assessment/Data: PPS 50%      Patient Active Problem List   Diagnosis Date Noted  . Protein-calorie malnutrition, severe 05/10/2020  . Abdominal pain 05/01/2020  . Calculus of gallbladder with biliary obstruction but without cholecystitis   . Elevated LFTs   . Aortic stenosis 03/26/2019  . Chronic venous insufficiency 12/12/2018  . Pain due to onychomycosis of toenails of both feet 08/11/2018  . Coagulation disorder ([place]) 08/11/2018  . Chronic combined systolic and diastolic CHF (congestive heart failure) ([place]) 05/11/2018  . Stage 4 chronic kidney disease ([place]) 05/11/2018  . Essential hypertension 11/11/2016   .  Pneumonia 01/23/2015  . Hypoxia 01/22/2015  . Elevated troponin 01/22/2015  . Acute cholecystitis 01/21/2015  . S/P CABG (coronary artery bypass graft) 01/21/2015  . Diabetes mellitus type 2 with complications ([place]) 90/24/0973  . Sepsis ([place])   . Valvular heart disease 09/29/2011  . Syncope 01/21/2011  . Coronary artery disease   . Aortic insufficiency   . Hypercholesteremia   . Mitral valve regurgitation     Palliative Care Assessment & Plan   Patient Profile: 87 y.o.malewith past medical history of skin cancer, CAD s/p CABG, HTN, DM, CKD stage IV, chronic systolic and diastolic heart failure(EF 40-45% in2020), and cholecystitis presented to the ED on 05/01/20 with complaints of epigastric pain and shortness of breath. Ultrasound with gallbladder sludge and HIDA scan with diffuse and persistent radiotracer activity identified throughout the liver with no signs of biliary activity after 1 hour of imaging. Despite patient being high procedural risk for cardiac event, care team felt PERCcholecystectomy drainwas an absolute necessity due to impending sepsis. Patient had cholecystostomy tube placement 05/02/20. Patient has been slow to improve and is with minimal oral intake.  Assessment: Severe sepsis Multiorgan dysfunction syndrome Acute cholecystitis Acute hypoxic respiratory failure NSTEMI AKI on CKD stage IV Orthostatic hypotension Severe malnutrition  Recommendations/Plan:  Continue current medical treatment  Continue DNR/DNI as previously documented  Patient's goal is for discharge to his daughter's home with HHPT and outpatient Palliative Care to follow  Patient understands to have successful rehabilitation experience his nutritional/oral intake must improve; otherwise, we would anticipate continual decline and hospice would be an appropriate next step. Patient's goal is to not be rehospitalized after discharge if he declines, but for transition to hospice  services  Continue mirtazapine 7.5mg  daily at bedtime for appetite stimulation/insomnia - if no improvement in appetite noted in next day or two, could consider increasing dose to 15mg  daily  MOST form completed (but not finalized) as follows: DNR , Comfort Measures (Do Not Intubate), Determine use or limitations of antibiotics when infection occurs, No IVF, No Feeding Tube. Incomplete form placed in shadow chart. PMT will attempt to finalize form with patient and daughter before his discharge tomorrow 3/28 per his request  PMT will continue to follow and support holistically  Goals of Care and Additional Recommendations:  Limitations on Scope of Treatment: Full Scope Treatment, No Artificial Feeding and No Tracheostomy  Code Status:    Code Status Orders  (From admission, onward)         Start     Ordered   05/01/20 1025  Do not attempt resuscitation (DNR)  Continuous       Question Answer Comment  In the event of cardiac or respiratory ARREST Do not call a "code blue"   In the event of cardiac or respiratory ARREST Do not perform Intubation, CPR, defibrillation or ACLS   In the event of cardiac or respiratory ARREST Use medication by any route, position, wound care, and other measures to relive pain and suffering. May use oxygen, suction and manual treatment of airway obstruction as needed for comfort.      05/01/20 1028        Code Status History    Date Active Date Inactive Code Status Order ID Comments User Context   04/06/2018 1304 04/06/2018 1831 Full Code [id number]  Lorretta Harp, MD Inpatient   01/21/2015 0717 01/27/2015 1917 Full Code [id number]  Norval Morton, MD Inpatient   Advance Care Planning Activity       Prognosis:  Unable to determine; <6 months if continued minimal PO intake  Discharge Planning:  Home with [place] was discussed with primary RN, patient, patient's daughter  Thank you for allowing the Palliative Medicine  Team to assist in the care of this patient.   Total Time 37 minutes Prolonged Time Billed  no       Greater than 50%  of this time was spent counseling and coordinating care related to the above assessment and plan.  Lin Landsman, NP  Please contact Palliative Medicine Team phone at [phone] for questions and concerns.

## 2020-05-11 NOTE — Progress Notes (Signed)
Progress Note    Nathan [name]  NTZ:[id number] DOB: May 25, 1933  DOA: 05/01/2020 PCP: Leanna Battles, MD    Brief Narrative:    Medical records reviewed and are as summarized below:  Nathan [name] is an 87 y.o. male with a history of CAD status post CABG, hypertension, hyperlipidemia, type 2 diabetes mellitus, CKD stage IV, and chronic combined heart failure now presenting with cholecystitis and demand ischemia.  Slow to improved, not eating much.    Assessment/Plan:   Principal Problem:   Abdominal pain Active Problems:   Hypercholesteremia   Diabetes mellitus type 2 with complications (HCC)   Essential hypertension   Chronic combined systolic and diastolic CHF (congestive heart failure) (HCC)   Stage 4 chronic kidney disease (HCC)   Calculus of gallbladder with biliary obstruction but without cholecystitis   Elevated LFTs   Protein-calorie malnutrition, severe   Severe gram-negative sepsis (E coli/Kleb pna)) on admission with multiorgan dysfunction syndrome secondary to acute cholecystitis -Much improved-saline lock IV-sepsis physiology has resolved -as he was a Poor lap chole candidate, IR Placed perc drain-IR: Will plan for routine exchange in 8-12 weeks             -Augmentin 3/22  Acute hypoxic respiratory failure  -not on at home  -2L at rest and 4L at home (<87% on room air and more work of breathing when O2 removed)  -DG chest unrevealing other than poor inspiratory status  NSTEMI on admission  -likely demand ischemia -cards consult appreciated: ASA plus eliquis  AKI on CKD 4 -baseline appears to be 2-3 (2.4 in 5/21)  DM type 2 -SSI- increased to regular from very sensitive -increased lantus  Orthostatic hypotension -? Volume vs DM neuropathy -TED hose/abdominal binder  Nutrition Status: Nutrition Problem: Severe Malnutrition Etiology: acute illness Signs/Symptoms: energy intake < or equal to 50% for > or equal to 5 days,moderate muscle  depletion,moderate fat depletion Interventions: Ensure Enlive (each supplement provides 350kcal and 20 grams of protein),Magic cup,Liberalize Diet  FTT -not eating much -palliative care consult  Family Communication/Anticipated D/C date and plan/Code Status   DVT prophylaxis: eliquis Code Status: DNR Disposition Plan: Status is: Inpatient daughter 3/23 Remains inpatient appropriate because:Inpatient level of care appropriate due to severity of illness   Dispo: The patient is from: Home              Anticipated d/c is to: Home              Patient currently is not medically stable to d/c. plan to d/c home in the AM   Difficult to place patient No     Medical Consultants:   Cards IR Palliative for GOC    Subjective:   Still not wanting to eat -reported episode of vomiting today  Objective:    Vitals:   05/10/20 2330 05/11/20 0257 05/11/20 0715 05/11/20 1109  BP: 110/76 125/64 133/77 132/75  Pulse: 97 99 100   Resp: 20 20 20 16   Temp: 97.9 F (36.6 C) 97.8 F (36.6 C) (!) 97.3 F (36.3 C) 98 F (36.7 C)  TempSrc: Oral Oral Oral Oral  SpO2: 93% 91% 92% 92%  Weight:      Height:        Intake/Output Summary (Last 24 hours) at 05/11/2020 1208 Last data filed at 05/11/2020 1100 Gross per 24 hour  Intake 125 ml  Output 675 ml  Net -550 ml   Filed Weights   05/08/20 0400 05/09/20 0432 05/10/20 0532  Weight: 80.6 kg 80.3 kg 80.1 kg    Exam:   General: Appearance:    elderlyd male in no acute distress     Lungs:     respirations unlabored     MS:   All extremities are intact.   Neurologic:  sleeping     Data Reviewed:   I have personally reviewed following labs and imaging studies:  Labs: Labs show the following:   Basic Metabolic Panel: Recent Labs  Lab 05/05/20 0129 05/06/20 0126 05/07/20 0043 05/08/20 0336 05/09/20 0123 05/11/20 0320  NA 138 138 134* 135 132* 134*  K 3.7 4.1 4.9 5.1 4.7 4.6  CL 105 106 104 106 101 103  CO2 22 24  20* 19* 21* 21*  GLUCOSE 177* 136* 221* 217* 248* 185*  BUN 61* 50* 42* 41* 50* 57*  CREATININE 3.09* 2.78* 2.53* 2.72* 2.96* 2.88*  CALCIUM 7.3* 8.1* 9.0 9.3 9.4 9.4  MG 1.9 1.9  --   --   --   --    GFR Estimated Creatinine Clearance: 21 mL/min (A) (by C-G formula based on SCr of 2.88 mg/dL (H)). Liver Function Tests: Recent Labs  Lab 05/05/20 0129 05/06/20 0126 05/07/20 0043 05/08/20 0336  AST 178* 108* 68* 34  ALT 221* 175* 140* 93*  ALKPHOS 37* 48 63 61  BILITOT 2.4* 2.0* 2.0* 1.8*  PROT 5.9* 6.2* 6.9 6.7  ALBUMIN 2.3* 2.5* 2.7* 2.5*   No results for input(s): LIPASE, AMYLASE in the last 168 hours. No results for input(s): AMMONIA in the last 168 hours. Coagulation profile No results for input(s): INR, PROTIME in the last 168 hours.  CBC: Recent Labs  Lab 05/06/20 0126 05/07/20 0043 05/08/20 0108 05/09/20 0123 05/11/20 0320  WBC 15.2* 15.3* 6.6 15.1* 15.3*  NEUTROABS  --  10.3* 4.1 11.3*  --   HGB 12.3* 14.4 12.3* 13.8 13.5  HCT 36.5* 41.0 36.2* 41.5 39.4  MCV 88.6 87.2 92.6 89.4 88.7  PLT 172 181 183 233 273   Cardiac Enzymes: No results for input(s): CKTOTAL, CKMB, CKMBINDEX, TROPONINI in the last 168 hours. BNP (last 3 results) No results for input(s): PROBNP in the last 8760 hours. CBG: Recent Labs  Lab 05/10/20 1038 05/10/20 1557 05/10/20 2100 05/11/20 0632 05/11/20 1058  GLUCAP 202* 271* 267* 178* 180*   D-Dimer: No results for input(s): DDIMER in the last 72 hours. Hgb A1c: No results for input(s): HGBA1C in the last 72 hours. Lipid Profile: No results for input(s): CHOL, HDL, LDLCALC, TRIG, CHOLHDL, LDLDIRECT in the last 72 hours. Thyroid function studies: No results for input(s): TSH, T4TOTAL, T3FREE, THYROIDAB in the last 72 hours.  Invalid input(s): FREET3 Anemia work up: Recent Labs    05/09/20 0123  VITAMINB12 1,416*   Sepsis Labs: Recent Labs  Lab 05/07/20 0043 05/08/20 0108 05/09/20 0123 05/11/20 0320  WBC 15.3* 6.6  15.1* 15.3*    Microbiology Recent Results (from the past 240 hour(s))  MRSA PCR Screening     Status: None   Collection Time: 05/01/20  6:56 PM   Specimen: Nasopharyngeal  Result Value Ref Range Status   MRSA by PCR NEGATIVE NEGATIVE Final    Comment:        The GeneXpert MRSA Assay (FDA approved for NASAL specimens only), is one component of a comprehensive MRSA colonization surveillance program. It is not intended to diagnose MRSA infection nor to guide or monitor treatment for MRSA infections. Performed at [facility], [place] [address],  [place], [place] [address]   Aerobic/Anaerobic Culture (surgical/deep wound)     Status: None   Collection Time: 05/02/20 11:13 AM   Specimen: BILE  Result Value Ref Range Status   Specimen Description BILE  Final   Special Requests NONE  Final   Gram Stain   Final    RARE WBC PRESENT, PREDOMINANTLY PMN ABUNDANT GRAM VARIABLE ROD MODERATE GRAM POSITIVE COCCI    Culture   Final    ABUNDANT KLEBSIELLA PNEUMONIAE MODERATE ESCHERICHIA COLI FEW ENTEROCOCCUS FAECALIS NO ANAEROBES ISOLATED Performed at [facility], [address], [place], [place] [address]    Report Status 05/09/2020 FINAL  Final   Organism ID, Bacteria KLEBSIELLA PNEUMONIAE  Final   Organism ID, Bacteria ESCHERICHIA COLI  Final   Organism ID, Bacteria ENTEROCOCCUS FAECALIS  Final      Susceptibility   Escherichia coli - MIC*    AMPICILLIN <=2 SENSITIVE Sensitive     CEFAZOLIN <=4 SENSITIVE Sensitive     CEFEPIME <=0.12 SENSITIVE Sensitive     CEFTAZIDIME <=1 SENSITIVE Sensitive     CEFTRIAXONE <=0.25 SENSITIVE Sensitive     CIPROFLOXACIN <=0.25 SENSITIVE Sensitive     GENTAMICIN <=1 SENSITIVE Sensitive     IMIPENEM <=0.25 SENSITIVE Sensitive     TRIMETH/SULFA <=20 SENSITIVE Sensitive     AMPICILLIN/SULBACTAM <=2 SENSITIVE Sensitive     PIP/TAZO <=4 SENSITIVE Sensitive     * MODERATE ESCHERICHIA COLI   Enterococcus faecalis - MIC*    AMPICILLIN <=2  SENSITIVE Sensitive     VANCOMYCIN <=0.5 SENSITIVE Sensitive     GENTAMICIN SYNERGY SENSITIVE Sensitive     * FEW ENTEROCOCCUS FAECALIS   Klebsiella pneumoniae - MIC*    AMPICILLIN >=32 RESISTANT Resistant     CEFAZOLIN <=4 SENSITIVE Sensitive     CEFEPIME <=0.12 SENSITIVE Sensitive     CEFTAZIDIME <=1 SENSITIVE Sensitive     CEFTRIAXONE <=0.25 SENSITIVE Sensitive     CIPROFLOXACIN <=0.25 SENSITIVE Sensitive     GENTAMICIN <=1 SENSITIVE Sensitive     IMIPENEM <=0.25 SENSITIVE Sensitive     TRIMETH/SULFA <=20 SENSITIVE Sensitive     AMPICILLIN/SULBACTAM 8 SENSITIVE Sensitive     PIP/TAZO <=4 SENSITIVE Sensitive     * ABUNDANT KLEBSIELLA PNEUMONIAE    Procedures and diagnostic studies:  No results found.  Medications:   . amoxicillin-clavulanate  500 mg Oral Q12H  . apixaban  2.5 mg Oral BID  . aspirin EC  81 mg Oral Daily  . feeding supplement  237 mL Oral BID BM  . hydrALAZINE  10 mg Oral Q8H  . insulin aspart  0-15 Units Subcutaneous TID WC  . insulin aspart  0-5 Units Subcutaneous QHS  . insulin glargine  20 Units Subcutaneous Daily  . isosorbide mononitrate  30 mg Oral Daily  . melatonin  3 mg Oral QHS  . metoprolol succinate  25 mg Oral Daily  . mirtazapine  7.5 mg Oral QHS  . pantoprazole  40 mg Oral Daily  . sodium chloride flush  5 mL Intracatheter Q8H  . tamsulosin  0.4 mg Oral QPC supper   Continuous Infusions: . sodium chloride Stopped (05/03/20 0328)     LOS: 10 days   Geradine Girt  [facility]   How to contact the [facility] Attending or Consulting provider [place] or covering provider during after hours [name], for this patient?  1. Check the care team in [facility] and look for a) attending/consulting [place] provider listed and b)  the [facility] team listed 2. Log into [email or web address] and use [place]'s universal password to access. If you do not have the password, please contact the hospital operator. 3. Locate the [facility] provider you are looking for under [facility] and page to a number that you can be directly reached. 4. If you still have difficulty reaching the provider, please page the [facility] (Director on Call) for the Hospitalists listed on amion for assistance.  05/11/2020, 12:08 PM

## 2020-05-12 ENCOUNTER — Telehealth: Payer: Self-pay | Admitting: Nurse Practitioner

## 2020-05-12 ENCOUNTER — Encounter: Payer: Self-pay | Admitting: Cardiovascular Disease

## 2020-05-12 ENCOUNTER — Other Ambulatory Visit: Payer: Self-pay | Admitting: Internal Medicine

## 2020-05-12 ENCOUNTER — Other Ambulatory Visit: Payer: PPO

## 2020-05-12 DIAGNOSIS — R7989 Other specified abnormal findings of blood chemistry: Secondary | ICD-10-CM

## 2020-05-12 LAB — CBC
HCT: 40.4 % (ref 39.0–52.0)
Hemoglobin: 13.5 g/dL (ref 13.0–17.0)
MCH: 30 pg (ref 26.0–34.0)
MCHC: 33.4 g/dL (ref 30.0–36.0)
MCV: 89.8 fL (ref 80.0–100.0)
Platelets: 310 10*3/uL (ref 150–400)
RBC: 4.5 MIL/uL (ref 4.22–5.81)
RDW: 13.5 % (ref 11.5–15.5)
WBC: 13.6 10*3/uL — ABNORMAL HIGH (ref 4.0–10.5)
nRBC: 0 % (ref 0.0–0.2)

## 2020-05-12 LAB — GLUCOSE, CAPILLARY
Glucose-Capillary: 176 mg/dL — ABNORMAL HIGH (ref 70–99)
Glucose-Capillary: 203 mg/dL — ABNORMAL HIGH (ref 70–99)

## 2020-05-12 LAB — BASIC METABOLIC PANEL
Anion gap: 7 (ref 5–15)
BUN: 53 mg/dL — ABNORMAL HIGH (ref 8–23)
CO2: 22 mmol/L (ref 22–32)
Calcium: 9.1 mg/dL (ref 8.9–10.3)
Chloride: 105 mmol/L (ref 98–111)
Creatinine, Ser: 2.83 mg/dL — ABNORMAL HIGH (ref 0.61–1.24)
GFR, Estimated: 21 mL/min — ABNORMAL LOW (ref >60)
Glucose, Bld: 172 mg/dL — ABNORMAL HIGH (ref 70–99)
Potassium: 4.8 mmol/L (ref 3.5–5.1)
Sodium: 134 mmol/L — ABNORMAL LOW (ref 135–145)

## 2020-05-12 MED ORDER — MIRTAZAPINE 7.5 MG PO TABS: 7.5000 mg | ORAL_TABLET | Freq: Every day | ORAL | 0 refills | Status: DC

## 2020-05-12 MED ORDER — ENSURE ENLIVE PO LIQD: 237.0000 mL | Freq: Two times a day (BID) | ORAL | 12 refills | Status: DC

## 2020-05-12 MED ORDER — METOPROLOL SUCCINATE ER 25 MG PO TB24: 25.0000 mg | ORAL_TABLET | Freq: Every day | ORAL | 0 refills | Status: DC

## 2020-05-12 MED ORDER — HYDRALAZINE HCL 10 MG PO TABS: 10.0000 mg | ORAL_TABLET | Freq: Three times a day (TID) | ORAL | 0 refills | Status: DC

## 2020-05-12 MED ORDER — MELATONIN 3 MG PO TABS: 3.0000 mg | ORAL_TABLET | Freq: Every day | ORAL | 0 refills | Status: DC

## 2020-05-12 MED ORDER — ONDANSETRON 4 MG PO TBDP: 4.0000 mg | ORAL_TABLET | Freq: Four times a day (QID) | ORAL | 1 refills | Status: DC | PRN

## 2020-05-12 MED FILL — MIRTAZAPINE 7.5 MG TABLET: 7.5 | 30 days supply | Qty: 30 | Fill #0

## 2020-05-12 MED FILL — METOPROLOL SUCCINATE ER 25: 25 | 30 days supply | Qty: 30 | Fill #0

## 2020-05-12 MED FILL — ONDANSETRON ODT 4 MG TABLET: 4 | 7 days supply | Qty: 20 | Fill #0

## 2020-05-12 MED FILL — hydrALAZINE HCL 10 MG TABS: 10 | 30 days supply | Qty: 90 | Fill #0

## 2020-05-12 NOTE — Progress Notes (Signed)
Nathan [name] Date of Birth  1933-12-19       [place], [place]    [place], [place]  [address]     [phone]       Fax  [phone]       Problem List: 1. Coronary artery disease-status post CABG 2. Aortic insufficiency 3. Hypercholesterolemia 4. Diabetes mellitus 5. Mitral regurgitation     Nathan [name] is doing very well. He's not had any episodes of chest pain or shortness of breath. 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  Dec. 2, 2014:  Nathan [name] is doing well.  Not as much energy.   No CP or dyspnea.    Dec. 3,2015:  Doing well from a cardiac standpoint.  started taking insulin recently.   Worked again for [name].   Dec. 2, 2016:  Doing well from a cardiac standpoint.   BP is high today , was normal yesterday   Feb. 14, 2017:   Nathan [name] is seen back for a visit BP is a bit elevated today .   Eats lots of salty foods ( soup for dinner) He had acute cholecystitis and had a perc drain placed.  The drain has been removed.  He never had his GB removed.  Was placed on home O2 during the hospitalization  And has been on home O2 since He does not think he needs it.   He occasionally will forget to turn it on and cannot tell the difference.  Will check his O2 sate on RA. ( time off oxygen  1440)   During his hospitalization, he had an echocardiogram that reveals normal left ventricle systolic function. He did have mild pulmonary hypertension. He had a stress Myoview study which revealed normal left ventricular systolic function and revealed no ischemia.  Aug. 17, 2017:  Doing well. Had the gall bladder percutaneous drain removed. Did not have GB removed.  Stands on his feet all day , has some leg edema   Feb. 13, 2018:  Doing well .   No further gall bladder issues  Sept.  26, 2018:  Nathan [name] is seen today for follow-up of his coronary artery  disease. Also  has a history of essential hypertension.  May 31, 2017 Doing well . Strawberries  Are ready - hes still working  No CP  No further gall bladder issues  August 29, 2017:  Nathan [name] is seen today  Has been having lots of orthostasis  Lightheadness. Has not been working - works at [name] - in [employer] building  Has been having lots of GI issues ( belching and GERD after eating ) , thinks he is having gall bladder issues.  Appetite has not been good  Labs from [name] office show a creatinine of 2.3  Had 4 of his pills stopped last week  Taking fish oil, amlodipine, ASA and potassium ( by mistake, was supposed to stop)  , MVI  Still eating salty meats   Oct. 21, 2019:  Nathan [name] is seen today  Has not had a good appetite for the past several months  Has a left leg DVT - Is now on Eliquis  orthostasis has improved    September 22, 2018:  Doing well.   No CP , no dyspnea.   BP is a little elevated.   March 26, 2019:  Nathan [name] is seen today for follow-up of his coronary artery disease and aortic insufficiency.  He also has a history of hyperlipidemia. Has retired from [employer].  No CP, Has some dyspnea if he works too quickly .   Feb. 4, 2022: Nathan [name] is seen today for follow up of his CAD, AI, HLD Has had some dizziness , especially when standing  He has occasional episodes of chest heaviness./ dyspnea He may have worseining CAD but we are limited in our ability to do a cath because of his CKD - stage 4.   May 13, 2020: Nathan [name] is seen today for follow up of his CAD, AI,HLD He was in the hospital recently for gram-negative sepsis with acute hypoxic respiratory failure He had + troponins likely due to demand ischemia Peak troponin was 5012  Has CKD  Baseline creatinine is between 2-3.  Just got out of hospital yesterday  WBC is still elevated.   Echocardiogram from May 02, 2020 shows mildly reduced left ventricular systolic function with an  ejection fraction of 40 to 45%.  We were unable to determine diastolic function.  There is mildly elevated pulmonary artery pressures. Mitral valve is degenerative with mild to moderate mitral stenosis. Mild AI  He has severe tricuspid regurgitation.  Was found to have gallstones.  Has a drainage tube in gall bladder   He was told that he was not an operative candidate due to excessive risk.  BP is low today.  He is on Toprol-XL 25 mg a day, isosorbide 30 mg a day, hydralazine 10 mg 3 times a day.  Will DC hydranlazine   Current Outpatient Medications on File Prior to Visit  Medication Sig Dispense Refill  . albuterol (VENTOLIN HFA) 108 (90 Base) MCG/ACT inhaler Inhale 1-2 puffs into the lungs every 6 (six) hours as needed for shortness of breath or wheezing.    [name] apixaban (ELIQUIS) 2.5 MG TABS tablet Take 2.5 mg by mouth 2 (two) times daily.    [name] atorvastatin (LIPITOR) 40 MG tablet Take 40 mg by mouth daily.  3  . Blood Glucose Monitoring Suppl (ONETOUCH VERIO) w/Device KIT     . feeding supplement (ENSURE ENLIVE / ENSURE PLUS) LIQD Take 237 mLs by mouth 2 (two) times daily between meals. 237 mL 12  . fenofibrate (TRICOR) 145 MG tablet Take 1 tablet (145 mg total) by mouth daily. 90 tablet 3  . guaifenesin (ROBITUSSIN) 100 MG/5ML syrup Take 200 mg by mouth 3 (three) times daily as needed for cough or congestion.    . hydrALAZINE (APRESOLINE) 10 MG tablet Take 1 tablet (10 mg total) by mouth every 8 (eight) hours. 90 tablet 0  . insulin NPH-regular Human (NOVOLIN 70/30) (70-30) 100 UNIT/ML injection Inject 20-35 Units into the skin See admin instructions. 35 units in the morning, 20 units at dinner    . isosorbide mononitrate (IMDUR) 30 MG 24 hr tablet Take 1 tablet (30 mg total) by mouth daily. 90 tablet 3  . Lancets (ONETOUCH ULTRASOFT) lancets     . melatonin 3 MG TABS tablet Take 1 tablet (3 mg total) by mouth at bedtime.  0  . metoprolol succinate (TOPROL-XL) 25 MG 24 hr tablet Take 1  tablet (25 mg total) by mouth daily. 30 tablet 0  . mirtazapine (REMERON) 7.5 MG tablet Take 1 tablet (7.5 mg total) by mouth at bedtime. 30 tablet 0  . Multiple Vitamins-Minerals (CENTRUM SILVER PO) Take 1 tablet by mouth daily.    [name]  nitroGLYCERIN (NITROSTAT) 0.4 MG SL tablet Place 1 tablet (0.4 mg total) under the tongue every 5 (five) minutes as needed for chest pain. 25 tablet 5  . Omega-3 Fatty Acids (FISH OIL) 1200 MG CAPS Take 1,200 mg by mouth 2 (two) times daily.    [name] omeprazole (PRILOSEC) 20 MG capsule Take 20 mg by mouth 2 (two) times daily.     . ondansetron (ZOFRAN-ODT) 4 MG disintegrating tablet Take 1 tablet (4 mg total) by mouth every 6 (six) hours as needed for nausea. 20 tablet 1  . ONETOUCH VERIO test strip     . RELION INSULIN SYRINGE 1ML/31G 31G X 5/16" 1 ML MISC USE 1 TWICE DAILY AS DIRECTED  6  . tamsulosin (FLOMAX) 0.4 MG CAPS capsule Take 0.4 mg by mouth at bedtime.    . TOLAK 4 % CREA Apply to areas qhs x 3weeks     No current facility-administered medications on file prior to visit.    No Known Allergies  Past Medical History:  Diagnosis Date  . Acid reflux   . Aortic insufficiency   . Cholecystitis   . Combined systolic and diastolic heart failure ([place])    Echo 04/2018: inf-lat HK, EF 40-45, severe basal septal hypertrophy, mild conc LVH, Gr 1 DD, severe LAE, mild to mod TR, mod AI, asc Aorta 39 mm  . Coronary artery disease 2002   CABG x5  . Diabetes mellitus   . DVT (deep venous thrombosis) (HCC)    previously on coumadin  . Hypercholesteremia   . Hyperlipidemia   . Mitral valve regurgitation   . Skin cancer    tumor removed off of his back    Past Surgical History:  Procedure Laterality Date  . BILIARY DRAINAGE CATHETER PLACEMENT W/ BILE DUCT TUBE CHANGE  01/2015  . CORONARY ARTERY BYPASS GRAFT  12/21/2000   x5  . IR PERC CHOLECYSTOSTOMY  05/02/2020  . IR US GUIDANCE  05/02/2020  . LEFT HEART CATH AND CORS/GRAFTS ANGIOGRAPHY N/A 04/06/2018    Procedure: LEFT HEART CATH AND CORS/GRAFTS ANGIOGRAPHY;  Surgeon: Lorretta Harp, MD;  Location: [facility];  Service: Cardiovascular;  Laterality: N/A;  . SKIN CANCER EXCISION    . TUMOR REMOVAL     off of back, skin cancer    Social History   Tobacco Use  Smoking Status Never Smoker  Smokeless Tobacco Never Used    Social History   Substance and Sexual Activity  Alcohol Use No    Family History  Problem Relation Age of Onset  . Heart attack Father   . Hypertension Mother   . Diabetes Brother   . Cancer Sister        breast  . Cancer Brother        throat & mouth    Reviw of Systems:  Noted in current history, otherwise review of systems is negative.  Physical Exam: Blood pressure 104/68, pulse (!) 110, height 6' (1.829 m), weight 174 lb (78.9 kg), SpO2 99 %.  GEN:    Frail, elderly man  HEENT: Normal NECK: No JVD; No carotid bruits LYMPHATICS: No lymphadenopathy CARDIAC: RRR , RESPIRATORY:  Clear to auscultation without rales, wheezing or rhonchi  ABDOMEN: Soft, non-tender, non-distended,   Drainage tube to gall bladder in place  MUSCULOSKELETAL:  No edema; No deformity  SKIN: Warm and dry NEUROLOGIC:  Alert and oriented x 3     ECG:       Assessment / Plan:  1. Coronary artery disease-    he was recently hospitalized with gram-negative sepsis and bacteremia.  The source seems to been his gallbladder.  He had gallstones and has had a cholecystostomy tube placed.  Apparently there is no option for surgical removal of his gallbladder.  He had a non-STEMI.  His troponin level was 5000.  I suspect this was all demand ischemia.  In addition, he has moderate CKD with a creatinine of 2.8 and this also added to his troponin level.  He is not having any angina.  At this point he is too weak and too frail to even consider heart catheterization.  The good news is that he is not having any angina or signs of ongoing ischemia. I think her best option is  to continue with supportive care to get him over this episode of cholecystitis and gram-negative sepsis. We will continue very conservative therapy for his CAD.     2.  Chronic combined systolic / diastolic CHF:   He is on isosorbide 100 and hydralazine.  Unfortunately, his blood pressure so low that we will have to discontinue the hydralazine.   3.  Left leg DVT :    4.  Aortic insufficiency -he has mild aortic insufficiency by echo.  Continue to follow.  5. Hypercholesterolemia-        I will see him in 4 to 6 weeks for follow-up visit.  [name], MD  05/13/2020 2:36 PM    [place],  [place], [place]  [address] Pager [phone] Phone: [phone]; Fax: [phone]

## 2020-05-12 NOTE — Progress Notes (Signed)
Referring Physician(s): Dr. Romana Juniper  Supervising Physician: Mir, Sharen Heck  Patient Status:  [facility] - In-pt  Chief Complaint: Acute Cholecystitis  Subjective: Lying in bed.  States he is discharging today.   Allergies: Patient has no known allergies.  Medications: Prior to Admission medications   Medication Sig Start Date End Date Taking? Authorizing Provider  albuterol (VENTOLIN HFA) 108 (90 Base) MCG/ACT inhaler Inhale 1-2 puffs into the lungs every 6 (six) hours as needed for shortness of breath or wheezing. 11/13/19  Yes [provider]  apixaban (ELIQUIS) 2.5 MG TABS tablet Take 2.5 mg by mouth 2 (two) times daily.   Yes [provider]  atorvastatin (LIPITOR) 40 MG tablet Take 40 mg by mouth daily. 02/02/15  Yes [provider]  Blood Glucose Monitoring Suppl (ONETOUCH VERIO) w/Device KIT  09/06/17  Yes [provider]  carvedilol (COREG) 6.25 MG tablet TAKE 1 TABLET BY MOUTH EVERY DAY Patient taking differently: Take 6.25 mg by mouth daily. 04/01/20  Yes Nahser, Wonda Cheng, MD  fenofibrate (TRICOR) 145 MG tablet Take 1 tablet (145 mg total) by mouth daily. 03/21/20  Yes Nahser, Wonda Cheng, MD  guaifenesin (ROBITUSSIN) 100 MG/5ML syrup Take 200 mg by mouth 3 (three) times daily as needed for cough or congestion.   Yes [provider]  hydrALAZINE (APRESOLINE) 25 MG tablet Take 1 tablet (25 mg total) by mouth 2 (two) times daily. 03/21/20  Yes Nahser, Wonda Cheng, MD  insulin NPH-regular Human (NOVOLIN 70/30) (70-30) 100 UNIT/ML injection Inject 20-35 Units into the skin See admin instructions. 35 units in the morning, 20 units at dinner   Yes [provider]  isosorbide mononitrate (IMDUR) 30 MG 24 hr tablet Take 1 tablet (30 mg total) by mouth daily. 07/10/19  Yes Nahser, Wonda Cheng, MD  Lancets Glory Rosebush ULTRASOFT) lancets  09/06/17  Yes [provider]  Multiple Vitamins-Minerals (CENTRUM SILVER PO) Take 1 tablet by mouth  daily.   Yes [provider]  nitroGLYCERIN (NITROSTAT) 0.4 MG SL tablet Place 1 tablet (0.4 mg total) under the tongue every 5 (five) minutes as needed for chest pain. 04/04/18  Yes Weaver, Scott T, PA-C  Omega-3 Fatty Acids (FISH OIL) 1200 MG CAPS Take 1,200 mg by mouth 2 (two) times daily.   Yes [provider]  omeprazole (PRILOSEC) 20 MG capsule Take 20 mg by mouth 2 (two) times daily.  01/06/11  Yes [provider]  ONETOUCH VERIO test strip  09/06/17  Yes [provider]  Heidelberg 1ML/31G 31G X 5/16" 1 ML MISC USE 1 TWICE DAILY AS DIRECTED 09/29/17  Yes [provider]  tamsulosin (FLOMAX) 0.4 MG CAPS capsule Take 0.4 mg by mouth at bedtime. 03/11/20  Yes [provider]  TOLAK 4 % CREA Apply to areas qhs x 3weeks 01/25/20  Yes [provider]     Vital Signs: BP 110/70 (BP Location: Right Arm)   Pulse 98   Temp 97.6 F (36.4 C) (Oral)   Resp (!) 22   Ht 6' (1.829 m)   Wt 174 lb 9.7 oz (79.2 kg)   SpO2 92%   BMI 23.68 kg/m   Physical Exam Vitals reviewed.  Constitutional:      Appearance: Normal appearance.  Cardiovascular:     Rate and Rhythm: Normal rate.  Pulmonary:     Effort: Pulmonary effort is normal. No respiratory distress.  Abdominal:     Palpations: Abdomen is soft.  Comments: Perc chole in place. Brown drainage in bag. ~200 mL output recorded.  Neurological:     Mental Status: He is alert.     Imaging: No results found.  Labs:  CBC: Recent Labs    05/08/20 0108 05/09/20 0123 05/11/20 0320 05/12/20 0046  WBC 6.6 15.1* 15.3* 13.6*  HGB 12.3* 13.8 13.5 13.5  HCT 36.2* 41.5 39.4 40.4  PLT 183 233 273 310    COAGS: Recent Labs    05/02/20 2028 05/03/20 1325 05/04/20 0137  APTT 67* 59* 59*    BMP: Recent Labs    06/26/19 0811 07/03/19 0821 05/01/20 0156 05/08/20 0336 05/09/20 0123 05/11/20 0320 05/12/20 0046  NA 142 142   < > 135 132* 134* 134*  K 4.0  4.3   < > 5.1 4.7 4.6 4.8  CL 105 112*   < > 106 101 103 105  CO2 20 18*   < > 19* 21* 21* 22  GLUCOSE 100* 75   < > 217* 248* 185* 172*  BUN 51* 37*   < > 41* 50* 57* 53*  CALCIUM 9.2 8.6   < > 9.3 9.4 9.4 9.1  CREATININE 2.93* 2.38*   < > 2.72* 2.96* 2.88* 2.83*  GFRNONAA 19* 24*   < > 22* 20* 21* 21*  GFRAA 22* 28*  --   --   --   --   --    < > = values in this interval not displayed.    LIVER FUNCTION TESTS: Recent Labs    05/05/20 0129 05/06/20 0126 05/07/20 0043 05/08/20 0336  BILITOT 2.4* 2.0* 2.0* 1.8*  AST 178* 108* 68* 34  ALT 221* 175* 140* 93*  ALKPHOS 37* 48 63 61  PROT 5.9* 6.2* 6.9 6.7  ALBUMIN 2.3* 2.5* 2.7* 2.5*    Assessment and Plan: Cholelithiasis S/p percutaneous cholecystostomy by Dr. Earleen Newport on 05/02/20. This is patients second percutaneous cholecystostomy- also had drain placed in 01/2015.  Now with recurrence requiring tube replacement.  Per surgery note, patient not a candidate for surgery at this time.  Discussed with patient and family.  Will plan for routine exchange in 8-12 weeks. Outpatient orders placed.  Patient last assessed by radiology 1 week ago.  Remains in-house although he states is to be discharged home today.  His percutaneous cholecystostomy remains in place with dark brown output.  Appears to be functioning well without issue.  Discussed drain care at home.   Continue routine drain care with flushes q shift and empty and record output.  Electronically Signed: Docia Barrier, PA 05/12/2020, 2:32 PM   I spent a total of 15 Minutes at the the patient's bedside AND on the patient's hospital floor or unit, greater than 50% of which was counseling/coordinating care for cholelithiasis.

## 2020-05-12 NOTE — Telephone Encounter (Signed)
-----   Message from Irving Copas., MD sent at 05/06/2020  2:28 AM EDT ----- Regarding: Follow-up Sheri, this patient remains an inpatient for now but later in the week should be discharged.  Please set up a follow-up with Jaclyn Shaggy or Norberto Sorenson in 4 to 6 weeks.  If possible, LFTs should be drawn prior to the clinic visit.  Thanks. GM

## 2020-05-12 NOTE — TOC Transition Note (Addendum)
Transition of Care [facility]) - CM/SW Discharge Note   Patient Details  Name: Crew Goren MRN: [id number] Date of Birth: 03/06/1933  Transition [facility] Care [facility]) CM/SW Contact:  Zenon Mayo, RN Phone Number: 05/12/2020, 11:58 AM   Clinical Narrative:    Patient is for dc today, NCM made referral to [facility] with [facility] and he is able to accept referral for [place], [place], [place] will have outpatient palliative services with [facility] also and home oxygen thru [employer].  Daughter being education on his drain per Staff RN.  Patient will go home with 2 week supply of saline flushes. Alvis Lemmings does not have a Nurse available til after 2 weeks , daughter will do the flushes.   Final next level of care: [place] Barriers to Discharge: No Barriers Identified   Patient Goals and CMS Choice Patient states their goals for this hospitalization and ongoing recovery are:: return home CMS Medicare.gov Compare Post Acute Care list provided to:: Patient Choice offered to / list presented to : Patient  Discharge Placement                       Discharge Plan and Services                  DME Agency: NA       HH Arranged: PT, OT, SW HH Agency: [place] Date [facility] Agency Contacted: 05/12/20 Time [place]: 8916 Representative spoke with at [place]: [place] (Agency) Interventions     Readmission Risk Interventions No flowsheet data found.

## 2020-05-12 NOTE — Telephone Encounter (Signed)
Sheri, pls contact patient to schedule a follow up appointment with Dr. Fuller Plan or me in 4 weeks and he needs hepatic panel prior to follow up appt. THX

## 2020-05-12 NOTE — Progress Notes (Signed)
Daily Progress Note   Patient Name: Nathan [name]       Date: 05/12/2020 DOB: [birth year]  Age: 87 y.o. MRN#: [id number] Attending Physician: Geradine Girt, DO Primary Care Physician: Leanna Battles, MD Admit Date: 05/01/2020  Reason for Consultation/Follow-up: Establishing goals of care  Subjective: Patient is sitting up in bed, getting dressed, eager to go home. Daughter/Nathan [name] is at bedside. We went over the MOST form patient completed yesterday with PMT. [name] verbalizes understanding of the form. She shares that she doesn't necessarily agree with his choices, but she will respect his wishes. Discussed that he especially wants to avoid hospitalization if possible.  Patient signed the MOST form. He outlined his wishes for the following treatment decisions:  Cardiopulmonary Resuscitation: Do Not Attempt Resuscitation (DNR/No CPR)  Medical Interventions: Comfort Measures: Keep clean, warm, and dry. Use medication by any route, positioning, wound care, and other measures to relieve pain and suffering. Use oxygen, suction and manual treatment of airway obstruction as needed for comfort. Do not transfer to the hospital unless comfort needs cannot be met in current location.  Antibiotics: Determine use of limitation of antibiotics when infection occurs  IV Fluids: No IV fluids (provide other measures to ensure comfort)  Feeding Tube: No feeding tube     Length of Stay: 11  Current Medications: Scheduled Meds:  . amoxicillin-clavulanate  500 mg Oral Q12H  . apixaban  2.5 mg Oral BID  . aspirin EC  81 mg Oral Daily  . feeding supplement  237 mL Oral BID BM  . hydrALAZINE  10 mg Oral Q8H  . insulin aspart  0-15 Units Subcutaneous TID WC  . insulin aspart  0-5 Units Subcutaneous QHS  .  insulin glargine  20 Units Subcutaneous Daily  . isosorbide mononitrate  30 mg Oral Daily  . melatonin  3 mg Oral QHS  . metoprolol succinate  25 mg Oral Daily  . mirtazapine  7.5 mg Oral QHS  . pantoprazole  40 mg Oral Daily  . sodium chloride flush  5 mL Intracatheter Q8H  . tamsulosin  0.4 mg Oral QPC supper    Continuous Infusions: . sodium chloride Stopped (05/03/20 0328)    PRN Meds: sodium chloride, acetaminophen **OR** acetaminophen, albuterol, bisacodyl, camphor-menthol, Gerhardt's butt cream, hydrALAZINE, HYDROcodone-acetaminophen, menthol-cetylpyridinium, ondansetron **OR** [DISCONTINUED] ondansetron (ZOFRAN) IV, phenol,  senna  Physical Exam Constitutional:      General: He is not in acute distress. Pulmonary:     Effort: Pulmonary effort is normal.  Abdominal:     Comments: cholecystostomy tube  Neurological:     Mental Status: He is alert and oriented to person, place, and time.             Vital Signs: BP 110/70 (BP Location: Right Arm)   Pulse 98   Temp 97.6 F (36.4 C) (Oral)   Resp (!) 22   Ht 6' (1.829 m)   Wt 79.2 kg   SpO2 92%   BMI 23.68 kg/m  SpO2: SpO2: 92 % O2 Device: O2 Device: Nasal Cannula O2 Flow Rate: O2 Flow Rate (L/min): 1 L/min  Intake/output summary:   Intake/Output Summary (Last 24 hours) at 05/12/2020 1259 Last data filed at 05/12/2020 0403 Gross per 24 hour  Intake 5 ml  Output 675 ml  Net -670 ml   LBM: Last BM Date: 05/08/20 Baseline Weight: Weight: 89.8 kg Most recent weight: Weight: 79.2 kg       Palliative Assessment/Data: PPS 50%         Palliative Care Assessment & Plan   Patient Profile: 87 y.o. male  with past medical history of skin cancer, CAD s/p CABG, HTN, DM, CKD stage IV, chronic systolic and diastolic heart failure (EF 40-45% in 2020), and cholecystitis presented to the ED on 05/01/20 with complaints of epigastric pain and shortness of breath. Ultrasound with gallbladder sludge and HIDA scan with  diffuse and persistent radiotracer activity identified throughout the liver with no signs of biliary activity after 1 hour of imaging. Despite patient being high procedural risk for cardiac event, care team felt [facility] cholecystectomy drain was an absolute necessity due to impending sepsis. Patient had cholecystostomy tube placement 05/02/20. Patient has been slow to improve and is with minimal oral intake.   Assessment: Severe sepsis Multiorgan dysfunction syndrome Acute cholecystitis Acute hypoxic respiratory failure NSTEMI AKI on CKD stage IV Orthostatic hypotension Severe malnutrition   Recommendations/Plan:  DNR/DNI as previously documented (gold form completed and placed on shadow chart)  MOST form completed/signed (copy made to scan into EMR and original placed on shadow chart)  DNR and MOST form to be sent with patient at discharge  Pending discharge home today to his daughter's home with HH/PT/OT   Outpatient palliative to follow ([facility])  Goals of Care and Additional Recommendations:  Limitations on Scope of Treatment: No Artificial Feeding, No IV Fluids and No Tracheostomy  Code Status: DNR/DNI  Prognosis:   Unable to determine, less than 6 months if continued minimal po intake  Discharge Planning:  Home with Home Health and outpatient palliative  Care plan was discussed with primary RN  Thank you for allowing the Palliative Medicine Team to assist in the care of this patient.   Total Time 25 minutes Prolonged Time Billed  no       Greater than 50%  of this time was spent counseling and coordinating care related to the above assessment and plan.  Lavena Bullion, NP  Please contact Palliative Medicine Team phone at [phone] for questions and concerns.

## 2020-05-12 NOTE — Progress Notes (Signed)
Patient Saturations on Room Air at Rest = 88% - needed 2L to keep sats >90%  Patient Saturations on Room Air while Ambulating = Not done as desats on RA at rest  Patient Saturations on 4 Liters of oxygen while Ambulating = 88%  Please briefly explain why patient needs home oxygen:Pt requiring O2 at rest and incr O2 with activity to maintain sats >90%.

## 2020-05-12 NOTE — Progress Notes (Signed)
[facility])  Outpatient Palliative Care  Request received for outpatient palliative care services once Nathan [name] d/c's home today.  [facility] will follow up with him in the home to begin services.  Thank you for this referral.  Venia Carbon RN, BSN, [facility] Liaison

## 2020-05-12 NOTE — TOC Progression Note (Signed)
Transition of Care [facility]) - Progression Note    Patient Details  Name: Akia Desroches MRN: [id number] Date of Birth: 04/24/1933  [facility] CM/SW Contact  Zenon Mayo, RN Phone Number: 05/12/2020, 9:29 AM  Clinical Narrative:    Patient is for dc today, NCM made referral to [place] for outpatient palliative services and made referral for HHPT. Awaiting call back. Also will need home oxygen, Staff RN will get the updated ambulatory sats,and the oxygen order is already in.        Expected Discharge Plan and Services           Expected Discharge Date: 05/12/20                                     Social Determinants of Health (SDOH) Interventions    Readmission Risk Interventions No flowsheet data found.

## 2020-05-12 NOTE — Progress Notes (Signed)
Physical Therapy Treatment Patient Details Name: Nathan [name] MRN: [id number] DOB: 11/03/1933 Today's Date: 05/12/2020    History of Present Illness Pt is an 87 y/o male admitted secondary to increased abdominal pain on 3/17. Found to have cholelithiasis and is s/p percutaneous cholecystostomy on 3/18. Pt also found to have NSTEMI. PMH includes DM, HTN, CHF, CKD, and CAD s/p CABG.    PT Comments    Pt progressing slowly toward goals. Emphasis on helping pt/dtr determine when safe to ambulate/mobilize given soft BP's, progression of gait, education of expected progression of mobility/safety at home.    Follow Up Recommendations  Home health PT;Supervision for mobility/OOB     Equipment Recommendations  3in1 (PT)    Recommendations for Other Services       Precautions / Restrictions Precautions Precautions: Fall Precaution Comments: percutaneous drain on R.    Mobility  Bed Mobility Overal bed mobility: Needs Assistance       Supine to sit: Supervision Sit to supine: Supervision   General bed mobility comments: increased time  flat bed, no rail    Transfers Overall transfer level: Needs assistance Equipment used: Rolling walker (2 wheeled) Transfers: Sit to/from Stand Sit to Stand: Min guard         General transfer comment: cues for hand placement  Ambulation/Gait Ambulation/Gait assistance: Min guard Gait Distance (Feet): 160 Feet Assistive device: Rolling walker (2 wheeled) Gait Pattern/deviations: Step-through pattern   Gait velocity interpretation: <1.8 ft/sec, indicate of risk for recurrent falls General Gait Details: Still needing min guard due to soft BP's and reports of light-headedness.  BP after standing 97/71 (79), taking just after sitting with dizziness.   Stairs             Wheelchair Mobility    Modified Rankin (Stroke Patients Only)       Balance     Sitting balance-[name]: Good Sitting balance - Comments: EOB without  support     Standing balance-[name]: Fair                              Cognition Arousal/Alertness: Awake/alert Behavior During Therapy: WFL for tasks assessed/performed Overall Cognitive Status: Within Functional Limits for tasks assessed                                        Exercises      General Comments General comments (skin integrity, edema, etc.): SpO2 on 2L in the lower 90's during gait.  HR 90's bpm      Pertinent Vitals/Pain Pain Assessment: Faces Faces Pain Scale: No hurt Pain Intervention(s): Monitored during session    Home Living                      Prior Function            PT Goals (current goals can now be found in the care plan section) Acute Rehab PT Goals Patient Stated Goal: to feel better PT Goal Formulation: With patient Time For Goal Achievement: 05/18/20 Potential to Achieve Goals: Fair Progress towards PT goals: Progressing toward goals    Frequency    Min 3X/week      PT Plan Current plan remains appropriate    Co-evaluation              AM-PAC PT "6 Clicks" Mobility  Outcome Measure  Help needed turning from your back to your side while in a flat bed without using bedrails?: None Help needed moving from lying on your back to sitting on the side of a flat bed without using bedrails?: A Little Help needed moving to and from a bed to a chair (including a wheelchair)?: A Little Help needed standing up from a chair using your arms (e.g., wheelchair or bedside chair)?: A Little Help needed to walk in hospital room?: A Little Help needed climbing 3-5 steps with a railing? : A Little 6 Click Score: 19    End of Session Equipment Utilized During Treatment: Oxygen Activity Tolerance: Patient tolerated treatment well;Other (comment) (mildly limited by lightheadedness and soft BP's) Patient left: in bed;with call bell/phone within reach;with bed alarm set;with family/visitor  present Nurse Communication: Mobility status PT Visit Diagnosis: Muscle weakness (generalized) (M62.81);Difficulty in walking, not elsewhere classified (R26.2);Other abnormalities of gait and mobility (R26.89)     Time: 1015-1040 PT Time Calculation (min) (ACUTE ONLY): 25 min  Charges:  $Gait Training: 8-22 mins $Therapeutic Activity: 8-22 mins                     05/12/2020  Ginger Carne., PT Acute Rehabilitation Services [phone]  (pager) [phone]  (office)   Tessie Fass Locklan Canoy 05/12/2020, 12:26 PM

## 2020-05-12 NOTE — Discharge Summary (Signed)
Physician Discharge Summary  Nathan [name] YOV:[id number] DOB: 05-21-1933 DOA: 05/01/2020  PCP: Leanna Battles, MD  Admit date: 05/01/2020 Discharge date: 05/12/2020  Admitted From: home Discharge disposition: home   Recommendations for Outpatient Follow-Up:   Home health Transition to hospice   Discharge Diagnosis:   Principal Problem:   Abdominal pain Active Problems:   Hypercholesteremia   Diabetes mellitus type 2 with complications [facility])   Essential hypertension   Chronic combined systolic and diastolic CHF (congestive heart failure) (HCC)   Stage 4 chronic kidney disease ([place])   Calculus of gallbladder with biliary obstruction but without cholecystitis   Elevated LFTs   Protein-calorie malnutrition, severe    Discharge Condition: Improved.  Diet recommendation:Carbohydrate-modified.    Wound care: None.  Code status: Full.   History of Present Illness:   Nathan [name] is a 87 y.o. male with medical history significant of CAD; HTN; HLD; DM; cholecystitis; and chronic combined CHF presenting with abdominal pain.  He started last night with epigastric pain and SOB.  He developed n/v while in the ER.  He specifically denies CP.  He feels like this is similar to prior gallbladder attacks and was previously supposed to have a cholecystectomy but never did.  He is completely miserable and reports that he would rather die than to continue to feel this way.    He was previously admitted for acute cholecystitis in 2016.  He developed sepsis and had elevated troponin and creatinine and so percutaneous drainage was pursued.  It is not clear why he never had subsequent cholecystectomy.   Hospital Course by Problem:   Severe gram-negative sepsis (E coli/Kleb pna)) on admission with multiorgan dysfunction syndrome secondary to acute cholecystitis -Much improved-saline lock IV-sepsis physiology has resolved -as he was a Poor lap chole candidate, IR Placed perc  drain-IR: Will plan for routine exchange in 8-12 weeks -Augmentin 3/22- 3/28  Acute hypoxic respiratory failure             -not on at home             -2L at rest and 4L at home (<87% on room air and more work of breathing when O2 removed)             -DG chest unrevealing other than poor inspiratory status  NSTEMI on admission             -likely demand ischemia -cards consult appreciated: ASA plus eliquis  AKI on CKD 4 -baseline appears to be 2-3 (2.4 in 5/21)  DM type 2 -SSI- increased to regular from very sensitive -increased lantus  Orthostatic hypotension -? Volume vs DM neuropathy -TED hose/abdominal binder  Nutrition Status: Nutrition Problem: Severe Malnutrition Etiology: acute illness Signs/Symptoms: energy intake < or equal to 50% for > or equal to 5 days,moderate muscle depletion,moderate fat depletion Interventions: Ensure Enlive (each supplement provides 350kcal and 20 grams of protein),Magic cup,Liberalize Diet  FTT -not eating much -palliative care to follow outpt    Medical Consultants:   IR GS   Discharge Exam:   Vitals:   05/12/20 0356 05/12/20 0815  BP: 100/74 114/75  Pulse: 94 99  Resp: 20 (!) 21  Temp: 97.7 F (36.5 C) (!) 97.5 F (36.4 C)  SpO2: 99% 93%   Vitals:   05/11/20 1932 05/12/20 0001 05/12/20 0356 05/12/20 0815  BP: 129/83 119/70 100/74 114/75  Pulse: 100 99 94 99  Resp: 17 20 20  (!) 21  Temp: 98 F (  36.7 C) 98.1 F (36.7 C) 97.7 F (36.5 C) (!) 97.5 F (36.4 C)  TempSrc: Oral Oral Oral Oral  SpO2: 90% 93% 99% 93%  Weight:   79.2 kg   Height:        General exam: Appears calm and comfortable.     The results of significant diagnostics from this hospitalization (including imaging, microbiology, ancillary and laboratory) are listed below for reference.     Procedures and Diagnostic Studies:   CT ABDOMEN PELVIS WO CONTRAST  Result Date: 05/01/2020 CLINICAL DATA:  Acute nonlocalized  abdominal pain. Epigastric pain and shortness of breath that began tonight EXAM: CT ABDOMEN AND PELVIS WITHOUT CONTRAST TECHNIQUE: Multidetector CT imaging of the abdomen and pelvis was performed following the standard protocol without IV contrast. COMPARISON:  10/26/2017 FINDINGS: Lower chest: Coronary atherosclerosis. Prior CABG. Fatty hiatal hernia. Calcified right hilar lymph nodes. Hepatobiliary: No focal liver abnormality.Cholelithiasis. No evidence of acute cholecystitis. Pancreas: Unremarkable. Spleen: Unremarkable. Adrenals/Urinary Tract: Negative adrenals. Symmetric renal atrophy. Renal sinus cysts on both sides. No hydronephrosis. Moderate distension of the urinary bladder with generalized wall thickening. Stomach/Bowel: No obstruction. No visible bowel inflammation. Mild distal colonic diverticulosis. Vascular/Lymphatic: No acute vascular abnormality. Atheromatous calcification of the aorta and branch vessels. No mass or adenopathy. Reproductive:Symmetric enlargement of the prostate which uplifts the bladder base. Other: No ascites or pneumoperitoneum. Shallow fatty right inguinal hernia suggested on coronal reformats. There could be some fat herniation along the upper right femoral ring. Musculoskeletal: No acute abnormalities. L5 chronic pars defects with L5-S1 advanced disc degeneration, anterolisthesis, and severe biforaminal impingement. Spinal degeneration is generalized in the lower thoracic and lumbar spine. IMPRESSION: 1. No acute finding or specific cause of symptoms. 2. Cholelithiasis. 3. Enlarged prostate with findings of chronic outlet obstruction affecting the bladder. 4.  Aortic Atherosclerosis (ICD10-I70.0). Electronically Signed   By: Monte Fantasia M.D.   On: 05/01/2020 05:30   DG Chest 2 View  Result Date: 05/01/2020 CLINICAL DATA:  Epigastric pain, short of breath EXAM: CHEST - 2 VIEW COMPARISON:  02/18/2015, 11/04/2017 FINDINGS: Frontal and lateral views of the chest  demonstrates stable postsurgical changes from median sternotomy. The cardiac silhouette is unremarkable. Lung volumes are diminished, with bibasilar consolidation, left greater than right, likely representing atelectasis. No effusion or pneumothorax. No acute bony abnormalities. IMPRESSION: 1. Low lung volumes, with bibasilar consolidation likely reflecting atelectasis. Electronically Signed   By: Randa Ngo M.D.   On: 05/01/2020 02:16   NM Hepato W/EF  Result Date: 05/01/2020 CLINICAL DATA:  Acute abdominal pain. Elevated LFTs and bilirubin level. EXAM: NUCLEAR MEDICINE HEPATOBILIARY IMAGING TECHNIQUE: Sequential images of the abdomen were obtained out to 60 minutes following intravenous administration of radiopharmaceutical. RADIOPHARMACEUTICALS:  7.6 mCi Tc-27m  Choletec IV COMPARISON:  None. FINDINGS: Prompt uptake of activity by the liver is seen. After 1 hour of imaging there is persistent diffuse increased tracer uptake throughout the liver with no biliary activity identified. No gallbladder activity is visualized. IMPRESSION: Diffuse and persistent radiotracer activity identified throughout the liver with no signs of biliary activity after 1 hour of imaging. Differential consideration of these findings include hepatocellular dysfunction secondary to hepatitis versus high-grade bile duct obstruction. No gallbladder activity visualized. Electronically Signed   By: Kerby Moors M.D.   On: 05/01/2020 13:17   IR Perc Cholecystostomy  Result Date: 05/02/2020 INDICATION: 87 year old male referred for percutaneous cholecystostomy EXAM: CHOLECYSTOSTOMY; IR ULTRASOUND GUIDANCE MEDICATIONS: None ANESTHESIA/SEDATION: Moderate (conscious) sedation was employed during this procedure. A total of Versed  1.0 mg and Fentanyl 25 mcg was administered intravenously. Moderate Sedation Time: 12 minutes. The patient's level of consciousness and vital signs were monitored continuously by radiology nursing throughout  the procedure under my direct supervision. FLUOROSCOPY TIME:  Fluoroscopy Time: 0 minutes 6 seconds (7 mGy). COMPLICATIONS: None PROCEDURE: Informed written consent was obtained from the patient and the patient's family after a thorough discussion of the procedural risks, benefits and alternatives. All questions were addressed. Maximal Sterile Barrier Technique was utilized including caps, mask, sterile gowns, sterile gloves, sterile drape, hand hygiene and skin antiseptic. A timeout was performed prior to the initiation of the procedure. Ultrasound survey of the right upper quadrant was performed for planning purposes. Once the patient is prepped and draped in the usual sterile fashion, the skin and subcutaneous tissues overlying the gallbladder were generously infiltrated 1% lidocaine for local anesthesia. A coaxial needle was advanced under ultrasound guidance through the skin subcutaneous tissues and a small segment of liver into the gallbladder lumen. With removal of the stylet, spontaneous dark bile drainage occurred. Using modified Seldinger technique, a 10 French drain was placed into the gallbladder fossa, with aspiration of the sample for the lab. Contrast injection confirmed position of the tube within the gallbladder lumen. Drainage catheter was attached to gravity drain with a suture retention placed. Patient tolerated the procedure well and remained hemodynamically stable throughout. No complications were encountered and no significant blood loss encountered. IMPRESSION: Status post percutaneous cholecystostomy. Signed, Dulcy Fanny. Dellia Nims, RPVI [facility] Radiology Electronically Signed   By: Corrie Mckusick D.O.   On: 05/02/2020 11:20   IR US Guidance  Result Date: 05/02/2020 INDICATION: 87 year old male referred for percutaneous cholecystostomy EXAM: CHOLECYSTOSTOMY; IR ULTRASOUND GUIDANCE MEDICATIONS: None ANESTHESIA/SEDATION: Moderate (conscious)  sedation was employed during this procedure. A total of Versed 1.0 mg and Fentanyl 25 mcg was administered intravenously. Moderate Sedation Time: 12 minutes. The patient's level of consciousness and vital signs were monitored continuously by radiology nursing throughout the procedure under my direct supervision. FLUOROSCOPY TIME:  Fluoroscopy Time: 0 minutes 6 seconds (7 mGy). COMPLICATIONS: None PROCEDURE: Informed written consent was obtained from the patient and the patient's family after a thorough discussion of the procedural risks, benefits and alternatives. All questions were addressed. Maximal Sterile Barrier Technique was utilized including caps, mask, sterile gowns, sterile gloves, sterile drape, hand hygiene and skin antiseptic. A timeout was performed prior to the initiation of the procedure. Ultrasound survey of the right upper quadrant was performed for planning purposes. Once the patient is prepped and draped in the usual sterile fashion, the skin and subcutaneous tissues overlying the gallbladder were generously infiltrated 1% lidocaine for local anesthesia. A coaxial needle was advanced under ultrasound guidance through the skin subcutaneous tissues and a small segment of liver into the gallbladder lumen. With removal of the stylet, spontaneous dark bile drainage occurred. Using modified Seldinger technique, a 10 French drain was placed into the gallbladder fossa, with aspiration of the sample for the lab. Contrast injection confirmed position of the tube within the gallbladder lumen. Drainage catheter was attached to gravity drain with a suture retention placed. Patient tolerated the procedure well and remained hemodynamically stable throughout. No complications were encountered and no significant blood loss encountered. IMPRESSION: Status post percutaneous cholecystostomy. Signed, Dulcy Fanny. Dellia Nims, RPVI [facility] Radiology Electronically Signed    By: Corrie Mckusick D.O.   On: 05/02/2020 11:20   ECHOCARDIOGRAM COMPLETE  Result Date:  05/02/2020    ECHOCARDIOGRAM REPORT   Patient Name:   KADAR CHANCE Date of Exam: 05/02/2020 Medical Rec #:  [id number]    Height:       72.0 in Accession #:    [id number]   Weight:       198.0 lb Date of Birth:  [birth year]    BSA:          2.121 m Patient Age:    62 years     BP:           100/61 mmHg Patient Gender: M            HR:           79 bpm. Exam Location:  Inpatient Procedure: 2D Echo Indications:    NSTEMI  History:        Patient has prior history of Echocardiogram examinations, most                 recent 04/26/2018. CAD, chronic kidney disease; Risk                 Factors:Dyslipidemia.  Sonographer:    Johny Chess Referring Phys: [id number] [place] A CHANDRASEKHAR IMPRESSIONS  1. Left ventricular ejection fraction, by estimation, is 40 to 45%. The left ventricle has mildly decreased function. The left ventricle demonstrates regional wall motion abnormalities (see scoring diagram/findings for description). Left ventricular diastolic parameters are indeterminate.  2. Right ventricular systolic function is mildly reduced. The right ventricular size is mildly enlarged. There is mildly elevated pulmonary artery systolic pressure. The IVC is not well seen, but using an estimated RA pressure of 8, the estimated right ventricular systolic pressure is 76.8 mmHg.  3. Left atrial size was moderately dilated.  4. Mitral regurgitation is anteriorly directed and eccentric, and may be underestimated. There is a small flail segment best seen in parasternal long axis views. . The mitral valve is degenerative. Mild to moderate mitral valve regurgitation. No evidence of mitral stenosis.  5. Tricuspid valve regurgitation is severe.  6. The aortic valve is abnormal. There is moderate calcification of the aortic valve. Aortic valve regurgitation is mild to moderate. No aortic stenosis is present. FINDINGS  Left Ventricle: Left  ventricular ejection fraction, by estimation, is 40 to 45%. The left ventricle has mildly decreased function. The left ventricle demonstrates regional wall motion abnormalities. The left ventricular internal cavity size was normal in size. There is no left ventricular hypertrophy. Left ventricular diastolic parameters are indeterminate.  LV Wall Scoring: The mid and distal lateral wall and mid anterolateral segment are hypokinetic. Right Ventricle: The right ventricular size is mildly enlarged. Right vetricular wall thickness was not well visualized. Right ventricular systolic function is mildly reduced. There is mildly elevated pulmonary artery systolic pressure. The tricuspid regurgitant velocity is 2.85 m/s, and with an assumed right atrial pressure of 8 mmHg, the estimated right ventricular systolic pressure is 11.5 mmHg. Left Atrium: Left atrial size was moderately dilated. Right Atrium: Right atrial size was normal in size. Pericardium: Trivial pericardial effusion is present. Mitral Valve: Mitral regurgitation is anteriorly directed and eccentric, and may be underestimated. There is a small flail segment best seen in parasternal long axis views. The mitral valve is degenerative in appearance. Mild to moderate mitral valve regurgitation. No evidence of mitral valve stenosis. Tricuspid Valve: The tricuspid valve is normal in structure. Tricuspid valve regurgitation is severe. Aortic Valve: Vena contracta 0.4 cm. The aortic valve is abnormal. There is moderate calcification of  the aortic valve. Aortic valve regurgitation is mild to moderate. Aortic regurgitation PHT measures 231 msec. No aortic stenosis is present. Pulmonic Valve: The pulmonic valve was grossly normal. Pulmonic valve regurgitation is trivial. Aorta: The aortic root is normal in size and structure. Venous: The inferior vena cava was not well visualized. IAS/Shunts: The interatrial septum was not well visualized.  LEFT VENTRICLE PLAX 2D LVIDd:          5.20 cm     Diastology LVIDs:         4.10 cm     LV e' medial:    8.59 cm/s LV PW:         1.20 cm     LV E/e' medial:  8.6 LV IVS:        1.00 cm     LV e' lateral:   8.27 cm/s LVOT diam:     2.30 cm     LV E/e' lateral: 8.9 LV SV:         76 LV SV Index:   36 LVOT Area:     4.15 cm  LV Volumes (MOD) LV vol d, MOD A4C: 74.2 ml LV vol s, MOD A4C: 44.4 ml LV SV MOD A4C:     74.2 ml RIGHT VENTRICLE RV S prime:     10.80 cm/s TAPSE (M-mode): 1.3 cm LEFT ATRIUM             Index       RIGHT ATRIUM           Index LA diam:        4.60 cm 2.17 cm/m  RA Area:     16.50 cm LA Vol (A2C):   98.7 ml 46.52 ml/m RA Volume:   44.50 ml  20.98 ml/m LA Vol (A4C):   73.4 ml 34.60 ml/m LA Biplane Vol: 88.8 ml 41.86 ml/m  AORTIC VALVE LVOT Vmax:   76.70 cm/s LVOT Vmean:  49.300 cm/s LVOT VTI:    0.183 m AI PHT:      231 msec  AORTA Ao Root diam: 3.50 cm MITRAL VALVE               TRICUSPID VALVE MV Area (PHT): 5.13 cm    TR Peak grad:   32.5 mmHg MV Decel Time: 148 msec    TR Vmax:        285.00 cm/s MV E velocity: 73.80 cm/s MV A velocity: 58.00 cm/s  SHUNTS MV E/A ratio:  1.27        Systemic VTI:  0.18 m                            Systemic Diam: 2.30 cm Cherlynn Kaiser MD Electronically signed by Cherlynn Kaiser MD Signature Date/Time: 05/02/2020/4:19:00 PM    Final    US Abdomen Limited RUQ (LIVER/GB)  Result Date: 05/01/2020 CLINICAL DATA:  Right upper quadrant pain. EXAM: ULTRASOUND ABDOMEN LIMITED RIGHT UPPER QUADRANT COMPARISON:  Ultrasound renal 03/18/2020, CT abdomen pelvis 10/26/2017, ultrasound abdomen 09/28/2017 FINDINGS: Gallbladder: Gallbladder sludge. No gallstones. No gallbladder wall thickening or pericholecystic fluid. No sonographic Murphy sign noted by sonographer. Common bile duct: Diameter: 5 mm. Liver: Limited evaluation with no definite focal lesion identified. Slightly increased parenchymal echogenicity. Portal vein is patent on color Doppler imaging with normal direction of blood flow towards  the liver. Other: None. IMPRESSION: 1. Gallbladder sludge with no findings of acute cholecystitis or choledocholithiasis. 2. Hepatic  steatosis. Please note limited evaluation for focal hepatic masses in a patient with hepatic steatosis due to decreased penetration of the acoustic ultrasound waves. Electronically Signed   By: Iven Finn M.D.   On: 05/01/2020 04:24     Labs:   Basic Metabolic Panel: Recent Labs  Lab 05/06/20 0126 05/07/20 0043 05/08/20 0336 05/09/20 0123 05/11/20 0320 05/12/20 0046  NA 138 134* 135 132* 134* 134*  K 4.1 4.9 5.1 4.7 4.6 4.8  CL 106 104 106 101 103 105  CO2 24 20* 19* 21* 21* 22  GLUCOSE 136* 221* 217* 248* 185* 172*  BUN 50* 42* 41* 50* 57* 53*  CREATININE 2.78* 2.53* 2.72* 2.96* 2.88* 2.83*  CALCIUM 8.1* 9.0 9.3 9.4 9.4 9.1  MG 1.9  --   --   --   --   --    GFR Estimated Creatinine Clearance: 21.3 mL/min (A) (by C-G formula based on SCr of 2.83 mg/dL (H)). Liver Function Tests: Recent Labs  Lab 05/06/20 0126 05/07/20 0043 05/08/20 0336  AST 108* 68* 34  ALT 175* 140* 93*  ALKPHOS 48 63 61  BILITOT 2.0* 2.0* 1.8*  PROT 6.2* 6.9 6.7  ALBUMIN 2.5* 2.7* 2.5*   No results for input(s): LIPASE, AMYLASE in the last 168 hours. No results for input(s): AMMONIA in the last 168 hours. Coagulation profile No results for input(s): INR, PROTIME in the last 168 hours.  CBC: Recent Labs  Lab 05/07/20 0043 05/08/20 0108 05/09/20 0123 05/11/20 0320 05/12/20 0046  WBC 15.3* 6.6 15.1* 15.3* 13.6*  NEUTROABS 10.3* 4.1 11.3*  --   --   HGB 14.4 12.3* 13.8 13.5 13.5  HCT 41.0 36.2* 41.5 39.4 40.4  MCV 87.2 92.6 89.4 88.7 89.8  PLT 181 183 233 273 310   Cardiac Enzymes: No results for input(s): CKTOTAL, CKMB, CKMBINDEX, TROPONINI in the last 168 hours. BNP: Invalid input(s): POCBNP CBG: Recent Labs  Lab 05/11/20 0632 05/11/20 1058 05/11/20 1511 05/11/20 2128 05/12/20 0545  GLUCAP 178* 180* 194* 149* 176*   D-Dimer No results for  input(s): DDIMER in the last 72 hours. Hgb A1c No results for input(s): HGBA1C in the last 72 hours. Lipid Profile No results for input(s): CHOL, HDL, LDLCALC, TRIG, CHOLHDL, LDLDIRECT in the last 72 hours. Thyroid function studies No results for input(s): TSH, T4TOTAL, T3FREE, THYROIDAB in the last 72 hours.  Invalid input(s): FREET3 Anemia work up No results for input(s): VITAMINB12, FOLATE, FERRITIN, TIBC, IRON, RETICCTPCT in the last 72 hours. Microbiology Recent Results (from the past 240 hour(s))  Aerobic/Anaerobic Culture (surgical/deep wound)     Status: None   Collection Time: 05/02/20 11:13 AM   Specimen: BILE  Result Value Ref Range Status   Specimen Description BILE  Final   Special Requests NONE  Final   Gram Stain   Final    RARE WBC PRESENT, PREDOMINANTLY PMN ABUNDANT GRAM VARIABLE ROD MODERATE GRAM POSITIVE COCCI    Culture   Final    ABUNDANT KLEBSIELLA PNEUMONIAE MODERATE ESCHERICHIA COLI FEW ENTEROCOCCUS FAECALIS NO ANAEROBES ISOLATED Performed at [facility], [address], [place], [place] [address]    Report Status 05/09/2020 FINAL  Final   Organism ID, Bacteria KLEBSIELLA PNEUMONIAE  Final   Organism ID, Bacteria ESCHERICHIA COLI  Final   Organism ID, Bacteria ENTEROCOCCUS FAECALIS  Final      Susceptibility   Escherichia coli - MIC*    AMPICILLIN <=2 SENSITIVE Sensitive     CEFAZOLIN <=4 SENSITIVE  Sensitive     CEFEPIME <=0.12 SENSITIVE Sensitive     CEFTAZIDIME <=1 SENSITIVE Sensitive     CEFTRIAXONE <=0.25 SENSITIVE Sensitive     CIPROFLOXACIN <=0.25 SENSITIVE Sensitive     GENTAMICIN <=1 SENSITIVE Sensitive     IMIPENEM <=0.25 SENSITIVE Sensitive     TRIMETH/SULFA <=20 SENSITIVE Sensitive     AMPICILLIN/SULBACTAM <=2 SENSITIVE Sensitive     PIP/TAZO <=4 SENSITIVE Sensitive     * MODERATE ESCHERICHIA COLI   Enterococcus faecalis - MIC*    AMPICILLIN <=2 SENSITIVE Sensitive     VANCOMYCIN <=0.5 SENSITIVE Sensitive     GENTAMICIN  SYNERGY SENSITIVE Sensitive     * FEW ENTEROCOCCUS FAECALIS   Klebsiella pneumoniae - MIC*    AMPICILLIN >=32 RESISTANT Resistant     CEFAZOLIN <=4 SENSITIVE Sensitive     CEFEPIME <=0.12 SENSITIVE Sensitive     CEFTAZIDIME <=1 SENSITIVE Sensitive     CEFTRIAXONE <=0.25 SENSITIVE Sensitive     CIPROFLOXACIN <=0.25 SENSITIVE Sensitive     GENTAMICIN <=1 SENSITIVE Sensitive     IMIPENEM <=0.25 SENSITIVE Sensitive     TRIMETH/SULFA <=20 SENSITIVE Sensitive     AMPICILLIN/SULBACTAM 8 SENSITIVE Sensitive     PIP/TAZO <=4 SENSITIVE Sensitive     * ABUNDANT KLEBSIELLA PNEUMONIAE     Discharge Instructions:   Discharge Instructions    Diet Carb Modified   Complete by: As directed    Discharge instructions   Complete by: As directed    Adjust blood sugar medications based on how much you are eating- check 2x/day   Discharge wound care:   Complete by: As directed    Routine drain care   Increase activity slowly   Complete by: As directed      Allergies as of 05/12/2020   No Known Allergies     Medication List    STOP taking these medications   carvedilol 6.25 MG tablet Commonly known as: COREG     TAKE these medications   albuterol 108 (90 Base) MCG/ACT inhaler Commonly known as: VENTOLIN HFA Inhale 1-2 puffs into the lungs every 6 (six) hours as needed for shortness of breath or wheezing.   apixaban 2.5 MG Tabs tablet Commonly known as: ELIQUIS Take 2.5 mg by mouth 2 (two) times daily.   atorvastatin 40 MG tablet Commonly known as: LIPITOR Take 40 mg by mouth daily.   CENTRUM SILVER PO Take 1 tablet by mouth daily.   feeding supplement Liqd Take 237 mLs by mouth 2 (two) times daily between meals.   fenofibrate 145 MG tablet Commonly known as: TRICOR Take 1 tablet (145 mg total) by mouth daily.   Fish Oil 1200 MG Caps Take 1,200 mg by mouth 2 (two) times daily.   guaifenesin 100 MG/5ML syrup Commonly known as: ROBITUSSIN Take 200 mg by mouth 3 (three)  times daily as needed for cough or congestion.   hydrALAZINE 10 MG tablet Commonly known as: APRESOLINE Take 1 tablet (10 mg total) by mouth every 8 (eight) hours. What changed:   medication strength  how much to take  when to take this   insulin NPH-regular Human (70-30) 100 UNIT/ML injection Inject 20-35 Units into the skin See admin instructions. 35 units in the morning, 20 units at dinner   isosorbide mononitrate 30 MG 24 hr tablet Commonly known as: IMDUR Take 1 tablet (30 mg total) by mouth daily.   melatonin 3 MG Tabs tablet Take 1 tablet (3 mg total) by mouth at bedtime.  metoprolol succinate 25 MG 24 hr tablet Commonly known as: TOPROL-XL Take 1 tablet (25 mg total) by mouth daily. Start taking on: May 13, 2020   mirtazapine 7.5 MG tablet Commonly known as: REMERON Take 1 tablet (7.5 mg total) by mouth at bedtime.   nitroGLYCERIN 0.4 MG SL tablet Commonly known as: NITROSTAT Place 1 tablet (0.4 mg total) under the tongue every 5 (five) minutes as needed for chest pain.   omeprazole 20 MG capsule Commonly known as: PRILOSEC Take 20 mg by mouth 2 (two) times daily.   ondansetron 4 MG disintegrating tablet Commonly known as: ZOFRAN-ODT Take 1 tablet (4 mg total) by mouth every 6 (six) hours as needed for nausea.   onetouch ultrasoft lancets   OneTouch Verio test strip Generic drug: glucose blood   OneTouch Verio w/Device Kit   RELION INSULIN SYRINGE 1ML/31G 31G X 5/16" 1 ML Misc Generic drug: Insulin Syringe-Needle U-100 USE 1 TWICE DAILY AS DIRECTED   tamsulosin 0.4 MG Caps capsule Commonly known as: FLOMAX Take 0.4 mg by mouth at bedtime.   [place] 4 % Crea Generic drug: Fluorouracil Apply to areas qhs x 3weeks            Durable Medical Equipment  (From admission, onward)         Start     Ordered   05/06/20 1617  For home use only DME oxygen  Once       Question Answer Comment  Length of Need Lifetime   Mode or (Route) Nasal  cannula   Liters per Minute 2   Frequency Continuous (stationary and portable oxygen unit needed)   Oxygen conserving device No   Oxygen delivery system Gas      05/06/20 1616           Discharge Care Instructions  (From admission, onward)         Start     Ordered   05/12/20 0000  Discharge wound care:       Comments: Routine drain care   05/12/20 0856          Follow-up Information    Autumn Messing III, MD Follow up.   Specialty: General Surgery Why: Call for an appointment in about 6 weeks; after radiology studies your drain as an outpatient.   Contact information: [place] [address] [phone]        [place] [facility] Follow up on 05/12/2020.   Specialty: Cardiology Why: obtain BMET lab work to follow up on the kidney function and electrolyte. No fasting needed.  Contact information: [address] [place]       Nahser, Wonda Cheng, MD Follow up on 05/13/2020.   Specialty: Cardiology Why: @2PM . Cardiology follow up  Contact information: [place] [address] [place] [address] [phone]                Time coordinating discharge: 35 min  Signed:  Geradine Girt DO  [facility] 05/12/2020, 8:56 AM

## 2020-05-13 ENCOUNTER — Other Ambulatory Visit: Payer: Self-pay

## 2020-05-13 ENCOUNTER — Encounter: Payer: Self-pay | Admitting: Cardiovascular Disease

## 2020-05-13 ENCOUNTER — Ambulatory Visit: Payer: PPO | Admitting: Cardiovascular Disease

## 2020-05-13 VITALS — BP 104/68 | HR 110 | Ht 72.0 in | Wt 174.0 lb

## 2020-05-13 DIAGNOSIS — I214 Non-ST elevation (NSTEMI) myocardial infarction: Secondary | ICD-10-CM

## 2020-05-13 DIAGNOSIS — I351 Nonrheumatic aortic (valve) insufficiency: Secondary | ICD-10-CM

## 2020-05-13 DIAGNOSIS — I5042 Chronic combined systolic (congestive) and diastolic (congestive) heart failure: Secondary | ICD-10-CM

## 2020-05-13 DIAGNOSIS — I25119 Atherosclerotic heart disease of native coronary artery with unspecified angina pectoris: Secondary | ICD-10-CM

## 2020-05-13 NOTE — Telephone Encounter (Signed)
Follow up arranged for 4/28 10:50 with Dr. Fuller Plan.   New lab orders placed. Left message for patient to call back

## 2020-05-13 NOTE — Patient Instructions (Addendum)
Medication Instructions:  Your physician has recommended you make the following change in your medication:   STOP hydralazine   *If you need a refill on your cardiac medications before your next appointment, please call your pharmacy*   Lab Work: BMET    06/25/2020 anytime between 7:30-4:30pm Your physician recommends that you return for lab work in: 4-6 weeks     Testing/Procedures: none   Follow-Up: At [facility], you and your health needs are our priority.  As part of our continuing mission to provide you with exceptional heart care, we have created designated Provider Care Teams.  These Care Teams include your primary Cardiologist (physician) and Advanced Practice Providers (APPs -  Physician Assistants and Nurse Practitioners) who all work together to provide you with the care you need, when you need it.   Your next appointment:    06/25/2020 at 2:00pm  The format for your next appointment:   In Person  Provider:   [name], MD

## 2020-05-14 NOTE — Telephone Encounter (Signed)
Left message for patient to call back  

## 2020-05-14 NOTE — Telephone Encounter (Signed)
Patient's daughter called back and spoke with [facility]. She will bring him for labs and confirmed appointment for 4/28

## 2020-05-19 DIAGNOSIS — R109 Unspecified abdominal pain: Secondary | ICD-10-CM | POA: Diagnosis not present

## 2020-05-19 DIAGNOSIS — I509 Heart failure, unspecified: Secondary | ICD-10-CM | POA: Diagnosis not present

## 2020-05-19 DIAGNOSIS — R7989 Other specified abnormal findings of blood chemistry: Secondary | ICD-10-CM | POA: Diagnosis not present

## 2020-05-21 DIAGNOSIS — E785 Hyperlipidemia, unspecified: Secondary | ICD-10-CM | POA: Diagnosis not present

## 2020-05-21 DIAGNOSIS — I2510 Atherosclerotic heart disease of native coronary artery without angina pectoris: Secondary | ICD-10-CM | POA: Diagnosis not present

## 2020-05-21 DIAGNOSIS — I951 Orthostatic hypotension: Secondary | ICD-10-CM | POA: Diagnosis not present

## 2020-05-21 DIAGNOSIS — Z9981 Dependence on supplemental oxygen: Secondary | ICD-10-CM | POA: Diagnosis not present

## 2020-05-21 DIAGNOSIS — N179 Acute kidney failure, unspecified: Secondary | ICD-10-CM | POA: Diagnosis not present

## 2020-05-21 DIAGNOSIS — I088 Other rheumatic multiple valve diseases: Secondary | ICD-10-CM | POA: Diagnosis not present

## 2020-05-21 DIAGNOSIS — N320 Bladder-neck obstruction: Secondary | ICD-10-CM | POA: Diagnosis not present

## 2020-05-21 DIAGNOSIS — Z794 Long term (current) use of insulin: Secondary | ICD-10-CM | POA: Diagnosis not present

## 2020-05-21 DIAGNOSIS — M5137 Other intervertebral disc degeneration, lumbosacral region: Secondary | ICD-10-CM | POA: Diagnosis not present

## 2020-05-21 DIAGNOSIS — N184 Chronic kidney disease, stage 4 (severe): Secondary | ICD-10-CM | POA: Diagnosis not present

## 2020-05-21 DIAGNOSIS — I214 Non-ST elevation (NSTEMI) myocardial infarction: Secondary | ICD-10-CM | POA: Diagnosis not present

## 2020-05-21 DIAGNOSIS — Z9049 Acquired absence of other specified parts of digestive tract: Secondary | ICD-10-CM | POA: Diagnosis not present

## 2020-05-21 DIAGNOSIS — E1122 Type 2 diabetes mellitus with diabetic chronic kidney disease: Secondary | ICD-10-CM | POA: Diagnosis not present

## 2020-05-21 DIAGNOSIS — M4317 Spondylolisthesis, lumbosacral region: Secondary | ICD-10-CM | POA: Diagnosis not present

## 2020-05-21 DIAGNOSIS — I5042 Chronic combined systolic (congestive) and diastolic (congestive) heart failure: Secondary | ICD-10-CM | POA: Diagnosis not present

## 2020-05-21 DIAGNOSIS — E46 Unspecified protein-calorie malnutrition: Secondary | ICD-10-CM | POA: Diagnosis not present

## 2020-05-21 DIAGNOSIS — Z48815 Encounter for surgical aftercare following surgery on the digestive system: Secondary | ICD-10-CM | POA: Diagnosis not present

## 2020-05-21 DIAGNOSIS — N400 Enlarged prostate without lower urinary tract symptoms: Secondary | ICD-10-CM | POA: Diagnosis not present

## 2020-05-21 DIAGNOSIS — E7800 Pure hypercholesterolemia, unspecified: Secondary | ICD-10-CM | POA: Diagnosis not present

## 2020-05-21 DIAGNOSIS — J9601 Acute respiratory failure with hypoxia: Secondary | ICD-10-CM | POA: Diagnosis not present

## 2020-05-21 DIAGNOSIS — I130 Hypertensive heart and chronic kidney disease with heart failure and stage 1 through stage 4 chronic kidney disease, or unspecified chronic kidney disease: Secondary | ICD-10-CM | POA: Diagnosis not present

## 2020-05-21 DIAGNOSIS — K760 Fatty (change of) liver, not elsewhere classified: Secondary | ICD-10-CM | POA: Diagnosis not present

## 2020-05-21 DIAGNOSIS — I313 Pericardial effusion (noninflammatory): Secondary | ICD-10-CM | POA: Diagnosis not present

## 2020-05-21 DIAGNOSIS — I700 Atherosclerosis of aorta: Secondary | ICD-10-CM | POA: Diagnosis not present

## 2020-05-21 DIAGNOSIS — K449 Diaphragmatic hernia without obstruction or gangrene: Secondary | ICD-10-CM | POA: Diagnosis not present

## 2020-05-22 ENCOUNTER — Telehealth: Payer: Self-pay

## 2020-05-22 NOTE — Telephone Encounter (Signed)
Attempted to contact patient's daughter [name] to schedule a Palliative Care consult appointment. No answer left a message to return call.

## 2020-05-26 ENCOUNTER — Telehealth: Payer: Self-pay

## 2020-05-26 NOTE — Telephone Encounter (Signed)
Spoke with patient's daughter [name]. She states patient has a lot of upcoming MD appointments and he is doing okay since hospital discharge. Will cancel referral and notify PCP.

## 2020-06-02 DIAGNOSIS — I088 Other rheumatic multiple valve diseases: Secondary | ICD-10-CM | POA: Diagnosis not present

## 2020-06-02 DIAGNOSIS — Z9049 Acquired absence of other specified parts of digestive tract: Secondary | ICD-10-CM | POA: Diagnosis not present

## 2020-06-02 DIAGNOSIS — N184 Chronic kidney disease, stage 4 (severe): Secondary | ICD-10-CM | POA: Diagnosis not present

## 2020-06-02 DIAGNOSIS — Z794 Long term (current) use of insulin: Secondary | ICD-10-CM | POA: Diagnosis not present

## 2020-06-02 DIAGNOSIS — K760 Fatty (change of) liver, not elsewhere classified: Secondary | ICD-10-CM | POA: Diagnosis not present

## 2020-06-02 DIAGNOSIS — I2510 Atherosclerotic heart disease of native coronary artery without angina pectoris: Secondary | ICD-10-CM | POA: Diagnosis not present

## 2020-06-02 DIAGNOSIS — E785 Hyperlipidemia, unspecified: Secondary | ICD-10-CM | POA: Diagnosis not present

## 2020-06-02 DIAGNOSIS — M4317 Spondylolisthesis, lumbosacral region: Secondary | ICD-10-CM | POA: Diagnosis not present

## 2020-06-02 DIAGNOSIS — M5137 Other intervertebral disc degeneration, lumbosacral region: Secondary | ICD-10-CM | POA: Diagnosis not present

## 2020-06-02 DIAGNOSIS — Z48815 Encounter for surgical aftercare following surgery on the digestive system: Secondary | ICD-10-CM | POA: Diagnosis not present

## 2020-06-02 DIAGNOSIS — J9601 Acute respiratory failure with hypoxia: Secondary | ICD-10-CM | POA: Diagnosis not present

## 2020-06-02 DIAGNOSIS — I214 Non-ST elevation (NSTEMI) myocardial infarction: Secondary | ICD-10-CM | POA: Diagnosis not present

## 2020-06-02 DIAGNOSIS — I700 Atherosclerosis of aorta: Secondary | ICD-10-CM | POA: Diagnosis not present

## 2020-06-02 DIAGNOSIS — I951 Orthostatic hypotension: Secondary | ICD-10-CM | POA: Diagnosis not present

## 2020-06-02 DIAGNOSIS — I313 Pericardial effusion (noninflammatory): Secondary | ICD-10-CM | POA: Diagnosis not present

## 2020-06-02 DIAGNOSIS — N320 Bladder-neck obstruction: Secondary | ICD-10-CM | POA: Diagnosis not present

## 2020-06-02 DIAGNOSIS — I5042 Chronic combined systolic (congestive) and diastolic (congestive) heart failure: Secondary | ICD-10-CM | POA: Diagnosis not present

## 2020-06-02 DIAGNOSIS — E46 Unspecified protein-calorie malnutrition: Secondary | ICD-10-CM | POA: Diagnosis not present

## 2020-06-02 DIAGNOSIS — K449 Diaphragmatic hernia without obstruction or gangrene: Secondary | ICD-10-CM | POA: Diagnosis not present

## 2020-06-02 DIAGNOSIS — E7800 Pure hypercholesterolemia, unspecified: Secondary | ICD-10-CM | POA: Diagnosis not present

## 2020-06-02 DIAGNOSIS — E1122 Type 2 diabetes mellitus with diabetic chronic kidney disease: Secondary | ICD-10-CM | POA: Diagnosis not present

## 2020-06-02 DIAGNOSIS — I130 Hypertensive heart and chronic kidney disease with heart failure and stage 1 through stage 4 chronic kidney disease, or unspecified chronic kidney disease: Secondary | ICD-10-CM | POA: Diagnosis not present

## 2020-06-02 DIAGNOSIS — N400 Enlarged prostate without lower urinary tract symptoms: Secondary | ICD-10-CM | POA: Diagnosis not present

## 2020-06-02 DIAGNOSIS — Z9981 Dependence on supplemental oxygen: Secondary | ICD-10-CM | POA: Diagnosis not present

## 2020-06-02 DIAGNOSIS — N179 Acute kidney failure, unspecified: Secondary | ICD-10-CM | POA: Diagnosis not present

## 2020-06-10 DIAGNOSIS — I25709 Atherosclerosis of coronary artery bypass graft(s), unspecified, with unspecified angina pectoris: Secondary | ICD-10-CM | POA: Diagnosis not present

## 2020-06-10 DIAGNOSIS — N184 Chronic kidney disease, stage 4 (severe): Secondary | ICD-10-CM | POA: Diagnosis not present

## 2020-06-10 DIAGNOSIS — E1121 Type 2 diabetes mellitus with diabetic nephropathy: Secondary | ICD-10-CM | POA: Diagnosis not present

## 2020-06-10 DIAGNOSIS — I700 Atherosclerosis of aorta: Secondary | ICD-10-CM | POA: Diagnosis not present

## 2020-06-10 DIAGNOSIS — I10 Essential (primary) hypertension: Secondary | ICD-10-CM | POA: Diagnosis not present

## 2020-06-10 DIAGNOSIS — R0600 Dyspnea, unspecified: Secondary | ICD-10-CM | POA: Diagnosis not present

## 2020-06-10 DIAGNOSIS — I5042 Chronic combined systolic (congestive) and diastolic (congestive) heart failure: Secondary | ICD-10-CM | POA: Diagnosis not present

## 2020-06-10 DIAGNOSIS — E43 Unspecified severe protein-calorie malnutrition: Secondary | ICD-10-CM | POA: Diagnosis not present

## 2020-06-10 DIAGNOSIS — K5900 Constipation, unspecified: Secondary | ICD-10-CM | POA: Diagnosis not present

## 2020-06-10 DIAGNOSIS — Z794 Long term (current) use of insulin: Secondary | ICD-10-CM | POA: Diagnosis not present

## 2020-06-12 ENCOUNTER — Other Ambulatory Visit (INDEPENDENT_AMBULATORY_CARE_PROVIDER_SITE_OTHER): Payer: PPO

## 2020-06-12 ENCOUNTER — Ambulatory Visit: Payer: PPO | Admitting: Gastroenterology

## 2020-06-12 ENCOUNTER — Encounter: Payer: Self-pay | Admitting: Gastroenterology

## 2020-06-12 VITALS — BP 124/68 | HR 96 | Ht 69.0 in | Wt 175.2 lb

## 2020-06-12 DIAGNOSIS — K810 Acute cholecystitis: Secondary | ICD-10-CM

## 2020-06-12 DIAGNOSIS — R109 Unspecified abdominal pain: Secondary | ICD-10-CM | POA: Diagnosis not present

## 2020-06-12 DIAGNOSIS — R7989 Other specified abnormal findings of blood chemistry: Secondary | ICD-10-CM

## 2020-06-12 DIAGNOSIS — I509 Heart failure, unspecified: Secondary | ICD-10-CM | POA: Diagnosis not present

## 2020-06-12 DIAGNOSIS — R630 Anorexia: Secondary | ICD-10-CM

## 2020-06-12 LAB — HEPATIC FUNCTION PANEL
ALT: 44 U/L (ref 0–53)
AST: 72 U/L — ABNORMAL HIGH (ref 0–37)
Albumin: 3.4 g/dL — ABNORMAL LOW (ref 3.5–5.2)
Alkaline Phosphatase: 48 U/L (ref 39–117)
Bilirubin, Direct: 0.4 mg/dL — ABNORMAL HIGH (ref 0.0–0.3)
Total Bilirubin: 1 mg/dL (ref 0.2–1.2)
Total Protein: 7.4 g/dL (ref 6.0–8.3)

## 2020-06-12 NOTE — Patient Instructions (Signed)
Your provider has requested that you go to the basement level for lab work before leaving today. Press "B" on the elevator. The lab is located at the first door on the left as you exit the elevator.  If you have not heard from our office or Interventional Radiology about an appointment in a week, then please contact interventional radiology at [phone].  You have been scheduled for an appointment with Dr. Marlou Starks at [facility]. Your appointment is on 07/10/20 at 10:10am. Please arrive at 9:40am for registration. Make certain to bring a list of current medications, including any over the counter medications or vitamins. Also bring your co-pay if you have one as well as your insurance cards. [facility] is located at [address], [address]. Should you need to reschedule your appointment, please contact them at [phone].  Thank you for choosing me and [facility].  Pricilla Riffle. Dagoberto Ligas., MD., Marval Regal

## 2020-06-12 NOTE — Progress Notes (Signed)
    History of Present Illness: This is an 87 year old male returning for follow up of elevated LFTs.  He is accompanied by his daughter.  He was hospitalized in March 2022 with cholecystitis, cholelithiasis treated with a cholecystostomy tube and followed by [facility], Dr. Marlou Starks. GI followed in hospital for elevated LFTs felt secondary to cholecystitis. No biliary dilation on [place] or CT. MRI/MRCP was deferred. LFTs steadily improved during his hospital stay with last set showing t bili=1.8, ALT=93, otherwise normal.  He has had a decreased appetite with a loss of taste for many foods since his hospital stay.  His daughter relates he lost 30 pounds during his hospital stay.  His weight appears to be stable at home but his intake is not normal.  He denies abdominal pain, nausea, vomiting.  He had a follow-up appointment with Dr. Leanna Battles, his PCP, this week.  He has not yet had follow-up with IR or [facility].   Current Medications, Allergies, Past Medical History, Past Surgical History, Family History and Social History were reviewed in [employer] record.   Physical Exam: General: Well developed, well nourished, elderly, no acute distress Head: Normocephalic and atraumatic Eyes: Sclerae anicteric, EOMI Ears: Normal auditory acuity Mouth: Not examined, mask on during Covid-19 pandemic Lungs: Clear throughout to auscultation Heart: Regular rate and rhythm; no murmurs, rubs or bruits Abdomen: Soft, non tender and non distended. RUQ drain in place with bilious fluid in the bag. No masses, hepatosplenomegaly or hernias noted. Normal Bowel sounds Rectal: Not done Musculoskeletal: Symmetrical with no gross deformities  Pulses:  Normal pulses noted Extremities: No clubbing, cyanosis, edema or deformities noted Neurological: Alert oriented x 4, grossly nonfocal Psychological:  Alert and cooperative. Normal mood and affect   Assessment and Recommendations:  1. Cholecystitis,  cholelithiasis treated with a cholecystostomy tube. Symptoms resolving however his appetite and taste for food have not returned to normal. Ensure, Boost or similar product 2-3 times daily until food intake improves. Follow up and mgmt per Dr. Marlou Starks, Dr. Philip Aspen and IR. GI follow up prn.   2. Elevated LFTs felt secondary to above. No bilary dilation on [place] or CT. Recheck LFT however choledocholithiasis appears unlikely. GI follow up prn.

## 2020-06-13 ENCOUNTER — Other Ambulatory Visit: Payer: Self-pay

## 2020-06-13 ENCOUNTER — Encounter: Payer: Self-pay | Admitting: Podiatry

## 2020-06-13 ENCOUNTER — Ambulatory Visit: Payer: PPO | Admitting: Podiatry

## 2020-06-13 ENCOUNTER — Other Ambulatory Visit (HOSPITAL_COMMUNITY): Payer: Self-pay

## 2020-06-13 DIAGNOSIS — M79674 Pain in right toe(s): Secondary | ICD-10-CM

## 2020-06-13 DIAGNOSIS — M79675 Pain in left toe(s): Secondary | ICD-10-CM

## 2020-06-13 DIAGNOSIS — B351 Tinea unguium: Secondary | ICD-10-CM | POA: Diagnosis not present

## 2020-06-13 DIAGNOSIS — D689 Coagulation defect, unspecified: Secondary | ICD-10-CM | POA: Diagnosis not present

## 2020-06-13 DIAGNOSIS — E118 Type 2 diabetes mellitus with unspecified complications: Secondary | ICD-10-CM | POA: Diagnosis not present

## 2020-06-13 DIAGNOSIS — I872 Venous insufficiency (chronic) (peripheral): Secondary | ICD-10-CM

## 2020-06-13 DIAGNOSIS — N184 Chronic kidney disease, stage 4 (severe): Secondary | ICD-10-CM

## 2020-06-13 DIAGNOSIS — R7989 Other specified abnormal findings of blood chemistry: Secondary | ICD-10-CM

## 2020-06-13 MED ORDER — NORMAL SALINE FLUSH 0.9 % IV SOLN
INTRAVENOUS | 1 refills | Status: DC
  Filled 2020-06-13: qty 450, 45d supply, fill #0
  Filled 2020-08-20: qty 450, 45d supply, fill #1

## 2020-06-13 NOTE — Progress Notes (Signed)
This patient returns to my office for at risk foot care.  This patient requires this care by a professional since this patient will be at risk due to having diabetes type 2, CKD and chronic insufficiensy.  Patient is taking eliquiss.  This patient is unable to cut nails himself since the patient cannot reach his nails.These nails are painful walking and wearing shoes.  This patient presents for at risk foot care today.  General Appearance  Alert, conversant and in no acute stress.  Vascular  Dorsalis pedis and posterior tibial  pulses are palpable  bilaterally.  Capillary return is within normal limits  bilaterally. Temperature is within normal limits  bilaterally.  Neurologic  Senn-Weinstein monofilament wire test diminished   bilaterally. Muscle power within normal limits bilaterally.  Nails Thick disfigured discolored nails with subungual debris  from hallux to fifth toes bilaterally. No evidence of bacterial infection or drainage bilaterally.  Orthopedic  No limitations of motion  feet .  No crepitus or effusions noted.  Hammer toes second  B/L.  MCJ DJD  B/L. HAV  B/L.  Skin  normotropic skin with no porokeratosis noted bilaterally.  No signs of infections or ulcers noted.     Onychomycosis  Pain in right toes  Pain in left toes  Consent was obtained for treatment procedures.   Mechanical debridement of nails 1-5  bilaterally performed with a nail nipper.  Filed with dremel without incident. Patient qualifies for diabetic shoes due to DPN ,HAV,  Hammer toes and DJD.     Return office visit  3 months                   Told patient to return for periodic foot care and evaluation due to potential at risk complications.   Gardiner Barefoot DPM

## 2020-06-18 ENCOUNTER — Other Ambulatory Visit (HOSPITAL_COMMUNITY): Payer: Self-pay | Admitting: Student

## 2020-06-18 ENCOUNTER — Ambulatory Visit (HOSPITAL_COMMUNITY)
Admission: RE | Admit: 2020-06-18 | Discharge: 2020-06-18 | Disposition: A | Discharge status: Home / Self Care | Payer: PPO | Source: Ambulatory Visit | Attending: Student | Admitting: Student

## 2020-06-18 ENCOUNTER — Other Ambulatory Visit: Payer: Self-pay

## 2020-06-18 DIAGNOSIS — K8070 Calculus of gallbladder and bile duct without cholecystitis without obstruction: Secondary | ICD-10-CM | POA: Diagnosis not present

## 2020-06-18 DIAGNOSIS — Z434 Encounter for attention to other artificial openings of digestive tract: Secondary | ICD-10-CM | POA: Diagnosis not present

## 2020-06-18 DIAGNOSIS — K810 Acute cholecystitis: Secondary | ICD-10-CM

## 2020-06-18 DIAGNOSIS — I509 Heart failure, unspecified: Secondary | ICD-10-CM | POA: Diagnosis not present

## 2020-06-18 DIAGNOSIS — K8062 Calculus of gallbladder and bile duct with acute cholecystitis without obstruction: Secondary | ICD-10-CM | POA: Diagnosis not present

## 2020-06-18 DIAGNOSIS — R109 Unspecified abdominal pain: Secondary | ICD-10-CM | POA: Diagnosis not present

## 2020-06-18 DIAGNOSIS — R7989 Other specified abnormal findings of blood chemistry: Secondary | ICD-10-CM | POA: Diagnosis not present

## 2020-06-18 HISTORY — PX: IR CATHETER TUBE CHANGE: IMG717

## 2020-06-18 HISTORY — PX: IR EXCHANGE BILIARY DRAIN: IMG6046

## 2020-06-18 MED ORDER — LIDOCAINE HCL 1 % IJ SOLN
INTRAMUSCULAR | Status: AC
  Filled 2020-06-18: qty 20

## 2020-06-18 MED ORDER — LIDOCAINE HCL (PF) 1 % IJ SOLN
INTRAMUSCULAR | Status: AC | PRN
  Administered 2020-06-18: 10 mL

## 2020-06-18 NOTE — Procedures (Signed)
Pre procedural Dx: Cholecystomy tube  Post procedural Dx: Same  Successful fluoro guided exchange of 10 Fr drainage catheter placement into the gallbladder lumen. Chole tube connected to gravity bag.  EBL: None Complications: None immediate  Ronny Bacon, MD Pager #: [phone]

## 2020-06-19 ENCOUNTER — Ambulatory Visit (HOSPITAL_COMMUNITY): Payer: PPO

## 2020-06-24 ENCOUNTER — Encounter: Payer: Self-pay | Admitting: Cardiovascular Disease

## 2020-06-24 NOTE — Progress Notes (Signed)
Nathan [name] Date of Birth  1933-12-19       [place]. [address], [place]    [place], [name]  [address]     [phone]       Fax  [phone]       Problem List: 1. Coronary artery disease-status post CABG 2. Aortic insufficiency 3. Hypercholesterolemia 4. Diabetes mellitus 5. Mitral regurgitation     Nathan [name] is doing very well. He's not had any episodes of chest pain or shortness of breath. 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  Dec. 2, 2014:  Nathan [name] is doing well.  Not as much energy.   No CP or dyspnea.    Dec. 3,2015:  Doing well from a cardiac standpoint.  started taking insulin recently.   Worked again for [name].   Dec. 2, 2016:  Doing well from a cardiac standpoint.   BP is high today , was normal yesterday   Feb. 14, 2017:   Nathan [name] is seen back for a visit BP is a bit elevated today .   Eats lots of salty foods ( soup for dinner) He had acute cholecystitis and had a perc drain placed.  The drain has been removed.  He never had his GB removed.  Was placed on home O2 during the hospitalization  And has been on home O2 since He does not think he needs it.   He occasionally will forget to turn it on and cannot tell the difference.  Will check his O2 sate on RA. ( time off oxygen  1440)   During his hospitalization, he had an echocardiogram that reveals normal left ventricle systolic function. He did have mild pulmonary hypertension. He had a stress Myoview study which revealed normal left ventricular systolic function and revealed no ischemia.  Aug. 17, 2017:  Doing well. Had the gall bladder percutaneous drain removed. Did not have GB removed.  Stands on his feet all day , has some leg edema   Feb. 13, 2018:  Doing well .   No further gall bladder issues  Sept.  26, 2018:  Nathan [name] is seen today for follow-up of his coronary artery  disease. Also  has a history of essential hypertension.  May 31, 2017 Doing well . Strawberries  Are ready - hes still working  No CP  No further gall bladder issues  August 29, 2017:  Nathan [name] is seen today  Has been having lots of orthostasis  Lightheadness. Has not been working - works at [name] - in [employer] building  Has been having lots of GI issues ( belching and GERD after eating ) , thinks he is having gall bladder issues.  Appetite has not been good  Labs from [name] office show a creatinine of 2.3  Had 4 of his pills stopped last week  Taking fish oil, amlodipine, ASA and potassium ( by mistake, was supposed to stop)  , MVI  Still eating salty meats   Oct. 21, 2019:  Nathan [name] is seen today  Has not had a good appetite for the past several months  Has a left leg DVT - Is now on Eliquis  orthostasis has improved    September 22, 2018:  Doing well.   No CP , no dyspnea.   BP is a little elevated.   March 26, 2019:  Nathan [name] is seen today for follow-up of his coronary artery disease and aortic insufficiency.  He also has a history of hyperlipidemia. Has retired from [employer].  No CP, Has some dyspnea if he works too quickly .   Feb. 4, 2022: Nathan [name] is seen today for follow up of his CAD, AI, HLD Has had some dizziness , especially when standing  He has occasional episodes of chest heaviness./ dyspnea He may have worseining CAD but we are limited in our ability to do a cath because of his CKD - stage 4.   May 13, 2020: Nathan [name] is seen today for follow up of his CAD, AI,HLD He was in the hospital recently for gram-negative sepsis with acute hypoxic respiratory failure He had + troponins likely due to demand ischemia Peak troponin was 5012  Has CKD  Baseline creatinine is between 2-3.  Just got out of hospital yesterday  WBC is still elevated.   Echocardiogram from May 02, 2020 shows mildly reduced left ventricular systolic function with an  ejection fraction of 40 to 45%.  We were unable to determine diastolic function.  There is mildly elevated pulmonary artery pressures. Mitral valve is degenerative with mild to moderate mitral stenosis. Mild AI  He has severe tricuspid regurgitation.  Was found to have gallstones.  Has a drainage tube in gall bladder   He was told that he was not an operative candidate due to excessive risk.  BP is low today.  He is on Toprol-XL 25 mg a day, isosorbide 30 mg a day, hydralazine 10 mg 3 times a day.  Will DC hydranlazine   Jun 25, 2020: Nathan [name] is seen for follow up of hospitalization for gram negative bacteremia, NSTEMI,  No further CP ,  Breathing is ok .  Still has a poor appetitie    His O2 sat has remained > 90 %.      Current Outpatient Medications on File Prior to Visit  Medication Sig Dispense Refill  . albuterol (VENTOLIN HFA) 108 (90 Base) MCG/ACT inhaler Inhale 1-2 puffs into the lungs every 6 (six) hours as needed for shortness of breath or wheezing.    [name] apixaban (ELIQUIS) 2.5 MG TABS tablet Take 2.5 mg by mouth 2 (two) times daily.    . Blood Glucose Monitoring Suppl (ONETOUCH VERIO) w/Device KIT     . feeding supplement (ENSURE ENLIVE / ENSURE PLUS) LIQD Take 237 mLs by mouth 2 (two) times daily between meals. 237 mL 12  . fenofibrate (TRICOR) 145 MG tablet Take 1 tablet (145 mg total) by mouth daily. 90 tablet 3  . insulin NPH-regular Human (NOVOLIN 70/30) (70-30) 100 UNIT/ML injection Inject 20-35 Units into the skin See admin instructions. 35 units in the morning, 20 units at dinner    . isosorbide mononitrate (IMDUR) 30 MG 24 hr tablet Take 1 tablet (30 mg total) by mouth daily. 90 tablet 3  . Lancets (ONETOUCH ULTRASOFT) lancets     . metoprolol succinate (TOPROL-XL) 25 MG 24 hr tablet Take 1 tablet (25 mg total) by mouth daily. 30 tablet 0  . mirtazapine (REMERON) 7.5 MG tablet Take 1 tablet (7.5 mg total) by mouth at bedtime. 30 tablet 0  . Multiple  Vitamins-Minerals (CENTRUM SILVER PO) Take 1 tablet by mouth daily.    . nitroGLYCERIN (NITROSTAT) 0.4 MG SL tablet Place 1 tablet (0.4 mg total) under the tongue every 5 (five) minutes as needed for chest pain. 25 tablet 5  . omeprazole (PRILOSEC) 20  MG capsule Take 20 mg by mouth 2 (two) times daily.     Letta Pate VERIO test strip     . RELION INSULIN SYRINGE 1ML/31G 31G X 5/16" 1 ML MISC USE 1 TWICE DAILY AS DIRECTED  6  . Sodium Chloride Flush (NORMAL SALINE FLUSH) 0.9 % SOLN Flush once daily with 10 mls as directed 450 mL 1   No current facility-administered medications on file prior to visit.    No Known Allergies  Past Medical History:  Diagnosis Date  . Acid reflux   . Aortic insufficiency   . Cholecystitis   . Combined systolic and diastolic heart failure (HCC)    Echo 04/2018: inf-lat HK, EF 40-45, severe basal septal hypertrophy, mild conc LVH, Gr 1 DD, severe LAE, mild to mod TR, mod AI, asc Aorta 39 mm  . Coronary artery disease 2002   CABG x5  . Diabetes mellitus   . DVT (deep venous thrombosis) (HCC)    previously on coumadin  . Hypercholesteremia   . Hyperlipidemia   . Mitral valve regurgitation   . Skin cancer    tumor removed off of his back    Past Surgical History:  Procedure Laterality Date  . BILIARY DRAINAGE CATHETER PLACEMENT W/ BILE DUCT TUBE CHANGE  01/2015  . CORONARY ARTERY BYPASS GRAFT  12/21/2000   x5  . IR CATHETER TUBE CHANGE  06/18/2020  . IR EXCHANGE BILIARY DRAIN  06/18/2020  . IR PERC CHOLECYSTOSTOMY  05/02/2020  . IR US GUIDANCE  05/02/2020  . LEFT HEART CATH AND CORS/GRAFTS ANGIOGRAPHY N/A 04/06/2018   Procedure: LEFT HEART CATH AND CORS/GRAFTS ANGIOGRAPHY;  Surgeon: Runell Gess, MD;  Location: [facility] INVASIVE CV LAB;  Service: Cardiovascular;  Laterality: N/A;  . SKIN CANCER EXCISION    . TUMOR REMOVAL     off of back, skin cancer    Social History   Tobacco Use  Smoking Status Never Smoker  Smokeless Tobacco Never Used    Social  History   Substance and Sexual Activity  Alcohol Use No    Family History  Problem Relation Age of Onset  . Heart attack Father   . Hypertension Mother   . Diabetes Brother   . Cancer Sister        breast  . Cancer Brother        throat & mouth    Reviw of Systems:  Noted in current history, otherwise review of systems is negative.  Physical Exam: Blood pressure 100/62, pulse (!) 101, height 5\' 9"  (1.753 m), weight 167 lb 9.6 oz (76 kg), SpO2 97 %.  GEN:   Chronically ill  HEENT: Normal NECK: No JVD; No carotid bruits LYMPHATICS: No lymphadenopathy CARDIAC: RRR , no murmurs, rubs, gallops RESPIRATORY:  Clear to auscultation without rales, wheezing or rhonchi  ABDOMEN: Soft, non-tender, non-distended MUSCULOSKELETAL:  No edema; No deformity  SKIN: Warm and dry NEUROLOGIC:  Alert and oriented x 3     ECG:      Jun 25, 2020: Sinus tach,  RBBB   Assessment / Plan:   1. Coronary artery disease-      No angina .   Cont conservative therapy  2.  Chronic combined systolic / diastolic CHF:    Seems to be stable  His o2 sats have been above 90%. He has asked Jun 27, 2020 to DC his home O2.  3.  Left leg DVT :  - cont eliquis      [place], MD  06/25/2020 2:34 PM    [facility] [address]  [place], [place]  [address] Pager [phone] Phone: [phone]; Fax: [phone]

## 2020-06-25 ENCOUNTER — Other Ambulatory Visit: Payer: Self-pay

## 2020-06-25 ENCOUNTER — Ambulatory Visit: Payer: PPO | Admitting: Cardiovascular Disease

## 2020-06-25 ENCOUNTER — Encounter: Payer: Self-pay | Admitting: Cardiovascular Disease

## 2020-06-25 ENCOUNTER — Other Ambulatory Visit: Payer: PPO | Admitting: *Deleted

## 2020-06-25 VITALS — BP 100/62 | HR 101 | Ht 69.0 in | Wt 167.6 lb

## 2020-06-25 DIAGNOSIS — I5042 Chronic combined systolic (congestive) and diastolic (congestive) heart failure: Secondary | ICD-10-CM

## 2020-06-25 DIAGNOSIS — I25119 Atherosclerotic heart disease of native coronary artery with unspecified angina pectoris: Secondary | ICD-10-CM | POA: Diagnosis not present

## 2020-06-25 NOTE — Patient Instructions (Signed)
Medication Instructions:  Your physician recommends that you continue on your current medications as directed. Please refer to the Current Medication list given to you today.  *If you need a refill on your cardiac medications before your next appointment, please call your pharmacy*   Lab Work: none If you have labs (blood work) drawn today and your tests are completely normal, you will receive your results only by: . MyChart Message (if you have MyChart) OR . A paper copy in the mail If you have any lab test that is abnormal or we need to change your treatment, we will call you to review the results.   Testing/Procedures: none   Follow-Up: At [facility], you and your health needs are our priority.  As part of our continuing mission to provide you with exceptional heart care, we have created designated Provider Care Teams.  These Care Teams include your primary Cardiologist (physician) and Advanced Practice Providers (APPs -  Physician Assistants and Nurse Practitioners) who all work together to provide you with the care you need, when you need it.   Your next appointment:   6 month(s)  The format for your next appointment:   In Person  Provider:   You may see Philip Nahser, MD or one of the following Advanced Practice Providers on your designated Care Team:    Scott Weaver, PA-C  Vin Bhagat, PA-C   

## 2020-06-26 LAB — BASIC METABOLIC PANEL
BUN/Creatinine Ratio: 16 (ref 10–24)
BUN: 39 mg/dL — ABNORMAL HIGH (ref 8–27)
CO2: 17 mmol/L — ABNORMAL LOW (ref 20–29)
Calcium: 9.1 mg/dL (ref 8.6–10.2)
Chloride: 103 mmol/L (ref 96–106)
Creatinine, Ser: 2.4 mg/dL — ABNORMAL HIGH (ref 0.76–1.27)
Glucose: 283 mg/dL — ABNORMAL HIGH (ref 65–99)
Potassium: 4.8 mmol/L (ref 3.5–5.2)
Sodium: 136 mmol/L (ref 134–144)
eGFR: 26 mL/min/{1.73_m2} — ABNORMAL LOW (ref >59)

## 2020-06-27 ENCOUNTER — Other Ambulatory Visit: Payer: Self-pay | Admitting: Cardiovascular Disease

## 2020-07-10 DIAGNOSIS — K8010 Calculus of gallbladder with chronic cholecystitis without obstruction: Secondary | ICD-10-CM | POA: Diagnosis not present

## 2020-07-16 ENCOUNTER — Telehealth (HOSPITAL_COMMUNITY): Payer: Self-pay

## 2020-07-16 NOTE — Telephone Encounter (Signed)
-----   Message from Sandi Mariscal, MD sent at 07/13/2020  5:08 AM EDT ----- Yes, we do have a longer drain that we can utilize.  Anderson Malta & Caryl Pina - please coordinate for this pt to come in to [facility] this week for chole tube exchange and upsize.    I would like him to get a 12 Fr, 45 cm length chole tube.  Cathren Harsh   ----- Message ----- From: Jovita Kussmaul, MD Sent: 07/10/2020  11:26 AM EDT To: Sandi Mariscal, MD  Hey this is PJ. I just had a quick question on this guy. He is old and struggles to flush the perc drain on his own. Is there a longer tube that might help him do it on his own? If not that is fine- I told him I would ask. Thanks. PJ

## 2020-07-23 ENCOUNTER — Other Ambulatory Visit (HOSPITAL_COMMUNITY): Payer: Self-pay

## 2020-07-25 ENCOUNTER — Ambulatory Visit (HOSPITAL_COMMUNITY): Admission: RE | Admit: 2020-07-25 | Payer: PPO | Source: Ambulatory Visit

## 2020-07-30 ENCOUNTER — Emergency Department (HOSPITAL_BASED_OUTPATIENT_CLINIC_OR_DEPARTMENT_OTHER)
Admission: EM | Admit: 2020-07-30 | Discharge: 2020-07-31 | Disposition: A | Discharge status: Home / Self Care | Payer: PPO | Attending: Emergency Medicine | Admitting: Emergency Medicine

## 2020-07-30 ENCOUNTER — Emergency Department (HOSPITAL_BASED_OUTPATIENT_CLINIC_OR_DEPARTMENT_OTHER): Payer: PPO

## 2020-07-30 ENCOUNTER — Encounter (HOSPITAL_BASED_OUTPATIENT_CLINIC_OR_DEPARTMENT_OTHER): Payer: Self-pay

## 2020-07-30 ENCOUNTER — Other Ambulatory Visit: Payer: Self-pay

## 2020-07-30 DIAGNOSIS — R531 Weakness: Secondary | ICD-10-CM | POA: Insufficient documentation

## 2020-07-30 DIAGNOSIS — E860 Dehydration: Secondary | ICD-10-CM | POA: Insufficient documentation

## 2020-07-30 DIAGNOSIS — E119 Type 2 diabetes mellitus without complications: Secondary | ICD-10-CM | POA: Diagnosis not present

## 2020-07-30 DIAGNOSIS — Z955 Presence of coronary angioplasty implant and graft: Secondary | ICD-10-CM | POA: Diagnosis not present

## 2020-07-30 DIAGNOSIS — Z79899 Other long term (current) drug therapy: Secondary | ICD-10-CM | POA: Insufficient documentation

## 2020-07-30 DIAGNOSIS — Z7901 Long term (current) use of anticoagulants: Secondary | ICD-10-CM | POA: Diagnosis not present

## 2020-07-30 DIAGNOSIS — R630 Anorexia: Secondary | ICD-10-CM | POA: Diagnosis present

## 2020-07-30 DIAGNOSIS — Z794 Long term (current) use of insulin: Secondary | ICD-10-CM | POA: Insufficient documentation

## 2020-07-30 DIAGNOSIS — Z85828 Personal history of other malignant neoplasm of skin: Secondary | ICD-10-CM | POA: Diagnosis not present

## 2020-07-30 DIAGNOSIS — I130 Hypertensive heart and chronic kidney disease with heart failure and stage 1 through stage 4 chronic kidney disease, or unspecified chronic kidney disease: Secondary | ICD-10-CM | POA: Insufficient documentation

## 2020-07-30 DIAGNOSIS — N3000 Acute cystitis without hematuria: Secondary | ICD-10-CM | POA: Diagnosis not present

## 2020-07-30 DIAGNOSIS — N401 Enlarged prostate with lower urinary tract symptoms: Secondary | ICD-10-CM | POA: Insufficient documentation

## 2020-07-30 DIAGNOSIS — N400 Enlarged prostate without lower urinary tract symptoms: Secondary | ICD-10-CM

## 2020-07-30 DIAGNOSIS — I5042 Chronic combined systolic (congestive) and diastolic (congestive) heart failure: Secondary | ICD-10-CM | POA: Insufficient documentation

## 2020-07-30 DIAGNOSIS — I2510 Atherosclerotic heart disease of native coronary artery without angina pectoris: Secondary | ICD-10-CM | POA: Insufficient documentation

## 2020-07-30 DIAGNOSIS — Z86718 Personal history of other venous thrombosis and embolism: Secondary | ICD-10-CM | POA: Insufficient documentation

## 2020-07-30 DIAGNOSIS — R7989 Other specified abnormal findings of blood chemistry: Secondary | ICD-10-CM | POA: Diagnosis not present

## 2020-07-30 DIAGNOSIS — K5900 Constipation, unspecified: Secondary | ICD-10-CM | POA: Diagnosis not present

## 2020-07-30 DIAGNOSIS — N184 Chronic kidney disease, stage 4 (severe): Secondary | ICD-10-CM | POA: Insufficient documentation

## 2020-07-30 LAB — COMPREHENSIVE METABOLIC PANEL
ALT: 43 U/L (ref 0–44)
AST: 51 U/L — ABNORMAL HIGH (ref 15–41)
Albumin: 3.7 g/dL (ref 3.5–5.0)
Alkaline Phosphatase: 47 U/L (ref 38–126)
Anion gap: 12 (ref 5–15)
BUN: 60 mg/dL — ABNORMAL HIGH (ref 8–23)
CO2: 19 mmol/L — ABNORMAL LOW (ref 22–32)
Calcium: 10 mg/dL (ref 8.9–10.3)
Chloride: 105 mmol/L (ref 98–111)
Creatinine, Ser: 3.5 mg/dL — ABNORMAL HIGH (ref 0.61–1.24)
GFR, Estimated: 17 mL/min — ABNORMAL LOW (ref >60)
Glucose, Bld: 154 mg/dL — ABNORMAL HIGH (ref 70–99)
Potassium: 4.4 mmol/L (ref 3.5–5.1)
Sodium: 136 mmol/L (ref 135–145)
Total Bilirubin: 1 mg/dL (ref 0.3–1.2)
Total Protein: 7.6 g/dL (ref 6.5–8.1)

## 2020-07-30 LAB — URINALYSIS, ROUTINE W REFLEX MICROSCOPIC
Bilirubin Urine: NEGATIVE
Glucose, UA: NEGATIVE mg/dL
Ketones, ur: NEGATIVE mg/dL
Nitrite: NEGATIVE
Protein, ur: 100 mg/dL — AB
Specific Gravity, Urine: 1.014 (ref 1.005–1.030)
WBC, UA: >50 WBC/hpf — ABNORMAL HIGH (ref 0–5)
pH: 5.5 (ref 5.0–8.0)

## 2020-07-30 LAB — TROPONIN I (HIGH SENSITIVITY)
Troponin I (High Sensitivity): 27 ng/L — ABNORMAL HIGH (ref <18)
Troponin I (High Sensitivity): 24 ng/L — ABNORMAL HIGH (ref <18)

## 2020-07-30 LAB — CBC
HCT: 43.4 % (ref 39.0–52.0)
Hemoglobin: 14.3 g/dL (ref 13.0–17.0)
MCH: 29.9 pg (ref 26.0–34.0)
MCHC: 32.9 g/dL (ref 30.0–36.0)
MCV: 90.6 fL (ref 80.0–100.0)
Platelets: 243 10*3/uL (ref 150–400)
RBC: 4.79 MIL/uL (ref 4.22–5.81)
RDW: 14.5 % (ref 11.5–15.5)
WBC: 12.4 10*3/uL — ABNORMAL HIGH (ref 4.0–10.5)
nRBC: 0 % (ref 0.0–0.2)

## 2020-07-30 MED ORDER — SODIUM CHLORIDE 0.9 % IV BOLUS
1000.0000 mL | Freq: Once | INTRAVENOUS | Status: AC
  Administered 2020-07-30: 1000 mL via INTRAVENOUS

## 2020-07-30 MED ORDER — TAMSULOSIN HCL 0.4 MG PO CAPS: 0.4000 mg | ORAL_CAPSULE | Freq: Every day | ORAL | 2 refills | Status: AC

## 2020-07-30 MED ORDER — CEPHALEXIN 500 MG PO CAPS: 500.0000 mg | ORAL_CAPSULE | Freq: Two times a day (BID) | ORAL | 0 refills | Status: DC

## 2020-07-30 MED ORDER — ONDANSETRON 4 MG PO TBDP: 4.0000 mg | ORAL_TABLET | Freq: Three times a day (TID) | ORAL | 0 refills | Status: DC | PRN

## 2020-07-30 MED ORDER — SODIUM CHLORIDE 0.9 % IV SOLN
1.0000 g | Freq: Once | INTRAVENOUS | Status: AC
  Administered 2020-07-30: 1 g via INTRAVENOUS
  Filled 2020-07-30: qty 10

## 2020-07-30 NOTE — Discharge Instructions (Addendum)
Nathan [name] was diagnosed with a urine infection today.  He will need to complete a course of antibiotics as prescribed for this.  Nathan [name] needs to have his kidney function (Creatinine) rechecked in 1 week by his doctor.  He had signs of dehydration today.  He needs to drink a lot more water at home.  I prescribed him Flomax because he does have an enlarged prostate.  If he does not yet have a urologist (I do not find records in our system), he should call to make an appointment with [facility] for this prostate.

## 2020-07-30 NOTE — ED Notes (Signed)
RT placed PIV in left [facility] without difficulty. Flushed, saline locked and secured.

## 2020-07-30 NOTE — ED Triage Notes (Signed)
Pt presents with his daughter and reports several month history of rectal bleeding.  Pt's daughter reports he has become progressively weaker over the last few months and becomes short of breath with activity.

## 2020-07-30 NOTE — ED Notes (Signed)
Present with a variety of complaints.  States has productive cough, Abdominal pain off and on with rectal bleeding.  States been having weakness and feeling tired.

## 2020-07-30 NOTE — ED Provider Notes (Signed)
[facility] EMERGENC[facility] DEPT Provider Note   CSN: [id number] Arrival date & time: 07/30/20  1610     History CC: Weakness   Nathan [name] is a 87 y.o. male with a history of severe multivessel coronary disease, cholecystitis status post perc-chole drain, HLD, HTN, on Eliquis presenting to emergency department generalized weakness.  Supplemental history provided by the patient's daughter.  Patient reports that he has not a lot of fatigue for the past several months since being in the hospital for acute cholecystitis and sepsis.  He had a drain placed in his gallbladder at that time which he has been maintaining at home.  He was told that he cannot have an operation because "my heart is too weak".  He reports he has had very poor appetite since then.  He describes the food tastes awful and has a weird taste.  His daughter reports that she has been helping him drink at least 1 Ensure a day but she feels he is otherwise not eating enough, that he has been losing a lot of weight, does not drink enough fluids.  Her PCP advised her to come to the ED for evaluation.  He reports he has not had a regular bowel movement in several days.  The patient has had 3 doses of COVID-vaccine and was positive for COVID in September.  He had no loss of taste or persistent nausea at that time.  He reports a small amount of bright red blood in stool when wiping.  This has been ongoing intermittently for "a long time."  HPI     Past Medical History:  Diagnosis Date   Acid reflux    Aortic insufficiency    Cholecystitis    Combined systolic and diastolic heart failure ([place])    Echo 04/2018: inf-lat HK, EF 40-45, severe basal septal hypertrophy, mild conc LVH, Gr 1 DD, severe LAE, mild to mod TR, mod AI, asc Aorta 39 mm   Coronary artery disease 2002   CABG x5   Diabetes mellitus    DVT (deep venous thrombosis) (HCC)    previously on coumadin   Hypercholesteremia    Hyperlipidemia    Mitral valve  regurgitation    Skin cancer    tumor removed off of his back    Patient Active Problem List   Diagnosis Date Noted   Non-ST elevation (NSTEMI) myocardial infarction ([place]) 05/13/2020   Protein-calorie malnutrition, severe 05/10/2020   Abdominal pain 05/01/2020   Calculus of gallbladder with biliary obstruction but without cholecystitis    Elevated LFTs    Aortic stenosis 03/26/2019   Chronic venous insufficiency 12/12/2018   Pain due to onychomycosis of toenails of both feet 08/11/2018   Coagulation disorder ([place]) 08/11/2018   Chronic combined systolic and diastolic CHF (congestive heart failure) ([place]) 05/11/2018   Stage 4 chronic kidney disease ([place]) 05/11/2018   Essential hypertension 11/11/2016   Pneumonia 01/23/2015   Hypoxia 01/22/2015   Elevated troponin 01/22/2015   Acute cholecystitis 01/21/2015   S/P CABG (coronary artery bypass graft) 01/21/2015   Diabetes mellitus type 2 with complications ([place]) 40/98/1191   Sepsis ([place])    Valvular heart disease 09/29/2011   Syncope 01/21/2011   Coronary artery disease    Aortic insufficiency    Hypercholesteremia    Mitral valve regurgitation     Past Surgical History:  Procedure Laterality Date   BILIARY DRAINAGE CATHETER PLACEMENT W/ BILE DUCT TUBE CHANGE  01/2015   CORONARY ARTERY BYPASS GRAFT  12/21/2000  x5   IR CATHETER TUBE CHANGE  06/18/2020   IR EXCHANGE BILIARY DRAIN  06/18/2020   IR PERC CHOLECYSTOSTOMY  05/02/2020   IR US GUIDANCE  05/02/2020   LEFT HEART CATH AND CORS/GRAFTS ANGIOGRAPHY N/A 04/06/2018   Procedure: LEFT HEART CATH AND CORS/GRAFTS ANGIOGRAPHY;  Surgeon: Lorretta Harp, MD;  Location: [facility];  Service: Cardiovascular;  Laterality: N/A;   SKIN CANCER EXCISION     TUMOR REMOVAL     off of back, skin cancer       Family History  Problem Relation Age of Onset   Heart attack Father    Hypertension Mother    Diabetes Brother    Cancer Sister        breast   Cancer Brother         throat & mouth    Social History   Tobacco Use   Smoking status: Never   Smokeless tobacco: Never  Vaping Use   Vaping Use: Never used  Substance Use Topics   Alcohol use: No   Drug use: No    Home Medications Prior to Admission medications   Medication Sig Start Date End Date Taking? Authorizing Provider  cephALEXin (KEFLEX) 500 MG capsule Take 1 capsule (500 mg total) by mouth 2 (two) times daily for 6 days. 07/31/20 08/06/20 Yes Wyvonnia Dusky, MD  ondansetron (ZOFRAN ODT) 4 MG disintegrating tablet Take 1 tablet (4 mg total) by mouth every 8 (eight) hours as needed for up to 15 doses for nausea or vomiting. 07/30/20  Yes Neya Creegan, Carola Rhine, MD  tamsulosin (FLOMAX) 0.4 MG CAPS capsule Take 1 capsule (0.4 mg total) by mouth daily. 07/30/20 08/29/20 Yes Damarius Karnes, Carola Rhine, MD  albuterol (VENTOLIN HFA) 108 (90 Base) MCG/ACT inhaler Inhale 1-2 puffs into the lungs every 6 (six) hours as needed for shortness of breath or wheezing. 11/13/19   [provider]  apixaban (ELIQUIS) 2.5 MG TABS tablet Take 2.5 mg by mouth 2 (two) times daily.    [provider]  Blood Glucose Monitoring Suppl (ONETOUCH VERIO) w/Device KIT  09/06/17   [provider]  feeding supplement (ENSURE ENLIVE / ENSURE PLUS) LIQD Take 237 mLs by mouth 2 (two) times daily between meals. 05/12/20   Geradine Girt, DO  fenofibrate (TRICOR) 145 MG tablet Take 1 tablet (145 mg total) by mouth daily. 03/21/20   Nahser, Wonda Cheng, MD  insulin NPH-regular Human (NOVOLIN 70/30) (70-30) 100 UNIT/ML injection Inject 20-35 Units into the skin See admin instructions. 35 units in the morning, 20 units at dinner    [provider]  isosorbide mononitrate (IMDUR) 30 MG 24 hr tablet TAKE 1 TABLET BY MOUTH EVERY DAY 06/27/20   Nahser, Wonda Cheng, MD  Lancets [facility] ULTRASOFT) lancets  09/06/17   [provider]  metoprolol succinate (TOPROL-XL) 25 MG 24 hr tablet Take 1 tablet (25 mg total) by mouth  daily. 05/13/20   Geradine Girt, DO  mirtazapine (REMERON) 7.5 MG tablet Take 1 tablet (7.5 mg total) by mouth at bedtime. 05/12/20   Geradine Girt, DO  Multiple Vitamins-Minerals (CENTRUM SILVER PO) Take 1 tablet by mouth daily.    [provider]  nitroGLYCERIN (NITROSTAT) 0.4 MG SL tablet Place 1 tablet (0.4 mg total) under the tongue every 5 (five) minutes as needed for chest pain. 04/04/18   Richardson Dopp T, PA-C  omeprazole (PRILOSEC) 20 MG capsule Take 20 mg by mouth 2 (two) times daily.  01/06/11   [provider]  ONETOUCH VERIO test strip  09/06/17   [provider]  [place] 1ML/31G 31G X 5/16" 1 ML MISC USE 1 TWICE DAILY AS DIRECTED 09/29/17   [provider]  Sodium Chloride Flush (NORMAL SALINE FLUSH) 0.9 % SOLN Flush once daily with 10 mls as directed 06/13/20   Corrie Mckusick, DO    Allergies    Patient has no known allergies.  Review of Systems   Review of Systems  Constitutional:  Positive for appetite change, fatigue and unexpected weight change. Negative for chills and fever.  HENT:  Negative for ear pain and sore throat.   Eyes:  Negative for pain and visual disturbance.  Respiratory:  Negative for cough and shortness of breath.   Cardiovascular:  Negative for chest pain and palpitations.  Gastrointestinal:  Positive for constipation. Negative for abdominal pain and vomiting.  Genitourinary:  Negative for dysuria and hematuria.  Musculoskeletal:  Negative for arthralgias and back pain.  Skin:  Negative for color change and rash.  Neurological:  Negative for syncope and headaches.  All other systems reviewed and are negative.  Physical Exam Updated Vital Signs BP 111/74   Pulse 78   Temp 97.8 F (36.6 C) (Oral)   Resp (!) 27   Ht 5' 11"  (1.803 m)   Wt 66.7 kg   SpO2 95%   BMI 20.50 kg/m   Physical Exam Constitutional:      General: He is not in acute distress. HENT:     Head: Normocephalic and atraumatic.   Eyes:     Conjunctiva/sclera: Conjunctivae normal.     Pupils: Pupils are equal, round, and reactive to light.  Cardiovascular:     Rate and Rhythm: Normal rate and regular rhythm.  Pulmonary:     Effort: Pulmonary effort is normal. No respiratory distress.  Abdominal:     General: There is no distension.     Palpations: Abdomen is soft.     Tenderness: There is no abdominal tenderness.     Comments: Perc chole drain in place with dark brown fluid draining  Skin:    General: Skin is warm and dry.  Neurological:     General: No focal deficit present.     Mental Status: He is alert. Mental status is at baseline.  Psychiatric:        Mood and Affect: Mood normal.        Behavior: Behavior normal.    ED Results / Procedures / Treatments   Labs (all labs ordered are listed, but only abnormal results are displayed) Labs Reviewed  COMPREHENSIVE METABOLIC PANEL - Abnormal; Notable for the following components:      Result Value   CO2 19 (*)    Glucose, Bld 154 (*)    BUN 60 (*)    Creatinine, Ser 3.50 (*)    AST 51 (*)    GFR, Estimated 17 (*)    All other components within normal limits  CBC - Abnormal; Notable for the following components:   WBC 12.4 (*)    All other components within normal limits  URINALYSIS, ROUTINE W REFLEX MICROSCOPIC - Abnormal; Notable for the following components:   APPearance CLOUDY (*)    Hgb urine dipstick MODERATE (*)    Protein, ur 100 (*)    Leukocytes,Ua LARGE (*)    WBC, UA >50 (*)    All other components within normal limits  TROPONIN I (HIGH SENSITIVITY) - Abnormal; Notable  for the following components:   Troponin I (High Sensitivity) 27 (*)    All other components within normal limits  TROPONIN I (HIGH SENSITIVITY) - Abnormal; Notable for the following components:   Troponin I (High Sensitivity) 24 (*)    All other components within normal limits  URINE CULTURE    EKG None  Radiology CT ABDOMEN PELVIS WO CONTRAST  Result  Date: 07/30/2020 CLINICAL DATA:  Poor appetite constipation EXAM: CT ABDOMEN AND PELVIS WITHOUT CONTRAST TECHNIQUE: Multidetector CT imaging of the abdomen and pelvis was performed following the standard protocol without IV contrast. COMPARISON:  CT 05/01/2020 FINDINGS: Lower chest: Lung bases demonstrate no acute consolidation or effusion. Fatty hiatal hernia. Coronary vascular calcification Hepatobiliary: Interval placement of percutaneous cholecystostomy tube with decompressed gallbladder and no right upper quadrant inflammatory change. No biliary dilatation Pancreas: Unremarkable. No pancreatic ductal dilatation or surrounding inflammatory changes. Spleen: Normal in size without focal abnormality. Adrenals/Urinary Tract: Adrenal glands are normal. Mild bilateral renal atrophy. No hydronephrosis. Bilateral renal sinus cysts. Lobulated slightly thick-walled urinary bladder Stomach/Bowel: Stomach is within normal limits. Appendix appears normal. No evidence of bowel wall thickening, distention, or inflammatory changes. Mild diverticular disease of the colon without acute inflammatory changes. Vascular/Lymphatic: Moderate aortic atherosclerosis. No aneurysm. No suspicious nodes Reproductive: Markedly enlarged prostate Other: Negative for pelvic effusion or free air Musculoskeletal: Chronic bilateral pars defect at L5 with anterolisthesis L5 on S1 and advanced degenerative changes. IMPRESSION: 1. No CT evidence for acute intra-abdominal or pelvic abnormality. 2. Interval placement of percutaneous cholecystostomy tube with decompressed gallbladder and no right upper quadrant inflammatory changes. 3. Enlarged prostate with slightly thick-walled lobulated distended bladder suggesting outlet obstruction Electronically Signed   By: Donavan Foil M.D.   On: 07/30/2020 21:40    Procedures Procedures   Medications Ordered in ED Medications  sodium chloride 0.9 % bolus 1,000 mL (0 mLs Intravenous Stopped 07/30/20 2333)   cefTRIAXone (ROCEPHIN) 1 g in sodium chloride 0.9 % 100 mL IVPB (0 g Intravenous Stopped 07/31/20 0023)    ED Course  I have reviewed the triage vital signs and the nursing notes.  Pertinent labs & imaging results that were available during my care of the patient were reviewed by me and considered in my medical decision making (see chart for details).  This patient complains of fatigue, poor appetite, weight loss.  his involves an extensive number of treatment options, and is a complaint that carries with it a high risk of complications and morbidity.  The differential diagnosis includes gastritis vs SBO vs constipation vs medication side effect vs dehydration vs anemia vs other   I ordered, reviewed, and interpreted labs.  His BUN is elevated at 60 and Cr is 3.5, suggestive of prerenal etiology/dehydration.  Cr was 2.8 two months ago, this does not constitute an AKI, remain clear to the patient and his daughter the need close attention of this.  I gave him IV fluids help with hydration. His troponin is 27.  It is not consistent with an NSTEMI.  His hemoglobin is normal.  He is a very mild leukocytosis which I suspect is related to some hemoconcentration at WBC 12.4.  I ordered imaging studies which included CT abdomen pelvis I independently visualized and interpreted imaging which showed no life-threatening abnormalities, and the monitor tracing which showed enlarged prostrate and bladder retention, no other focal findings to explain his poor appetite.  No evidence of SBO or colitis or significant constipation Additional history was obtained from patient's daughter at  bedside  After the interventions stated above, I reevaluated the patient and found remain hemodynamically stable.  We discussed his enlarged prostate.  He was on Flomax in the past but has not been recently.  We will need to restart him on this.  He will try to provide a urine sample so we can ensure that there is not a urine  infection.  I did discuss with him the possibility of a Foley catheter to relieve the obstruction, but he does not want this time, and I do suspect that this is a chronic outlet obstruction.  Bladder scan showed approximately 300 cc of urine per nursing.  Overall, discussed with the patient and his daughter the work-up in the ED.  Have not found an evident explanation for his poor appetite.  However he does feel like he can make more of an effort to drink water at home and that he is keeping calories down.  He will need to follow-up with his PCP for this.  At this time there is no emergent indication for hospitalization, but if his symptoms worsen he should return to the ED  *  Update - UA indicative of possible UTI.  Will give rocephin here and discharge on 6 more days of Keflex.      Final Clinical Impression(s) / ED Diagnoses Final diagnoses:  Dehydration  Elevated serum creatinine  Enlarged prostate  Acute cystitis without hematuria    Rx / DC Orders ED Discharge Orders          Ordered    cephALEXin (KEFLEX) 500 MG capsule  2 times daily        07/30/20 2329    tamsulosin (FLOMAX) 0.4 MG CAPS capsule  Daily        07/30/20 2310    ondansetron (ZOFRAN ODT) 4 MG disintegrating tablet  Every 8 hours PRN        07/30/20 2313             Wyvonnia Dusky, MD 07/31/20 1251

## 2020-08-01 DIAGNOSIS — I25810 Atherosclerosis of coronary artery bypass graft(s) without angina pectoris: Secondary | ICD-10-CM | POA: Diagnosis not present

## 2020-08-01 DIAGNOSIS — I130 Hypertensive heart and chronic kidney disease with heart failure and stage 1 through stage 4 chronic kidney disease, or unspecified chronic kidney disease: Secondary | ICD-10-CM | POA: Diagnosis not present

## 2020-08-01 DIAGNOSIS — I351 Nonrheumatic aortic (valve) insufficiency: Secondary | ICD-10-CM | POA: Diagnosis not present

## 2020-08-01 DIAGNOSIS — R634 Abnormal weight loss: Secondary | ICD-10-CM | POA: Diagnosis not present

## 2020-08-01 DIAGNOSIS — R627 Adult failure to thrive: Secondary | ICD-10-CM | POA: Diagnosis not present

## 2020-08-01 DIAGNOSIS — I5042 Chronic combined systolic (congestive) and diastolic (congestive) heart failure: Secondary | ICD-10-CM | POA: Diagnosis not present

## 2020-08-01 DIAGNOSIS — E1121 Type 2 diabetes mellitus with diabetic nephropathy: Secondary | ICD-10-CM | POA: Diagnosis not present

## 2020-08-01 DIAGNOSIS — N184 Chronic kidney disease, stage 4 (severe): Secondary | ICD-10-CM | POA: Diagnosis not present

## 2020-08-01 DIAGNOSIS — N401 Enlarged prostate with lower urinary tract symptoms: Secondary | ICD-10-CM | POA: Diagnosis not present

## 2020-08-01 LAB — URINE CULTURE

## 2020-08-12 ENCOUNTER — Other Ambulatory Visit: Payer: Self-pay | Admitting: Student

## 2020-08-12 ENCOUNTER — Telehealth (HOSPITAL_COMMUNITY): Payer: Self-pay

## 2020-08-12 NOTE — Telephone Encounter (Signed)
-----   Message from Annye Asa, MD sent at 08/12/2020  8:03 AM EDT ----- Regarding: RE: Sedation? Please keep him with sedation.  Thanks  Mir ----- Message ----- From: Danielle Dess Sent: 08/12/2020   7:54 AM EDT To: Paula Libra Mir, MD Subject: Sedation?                                      Dr. Dwaine Gale,   Mr. Kemppainen is coming tomorrow for his exchange and upsize. I have him coming to short stay for sedation or should I have him come directly to radiology without sedation. Wanted to check with you since you are at [facility] tomorrow.   Thanks,  Lia Foyer ----- Message ----- From: Sandi Mariscal, MD Sent: 07/13/2020   5:12 AM EDT To: Jovita Kussmaul, MD, Joanell Rising, #  Yes, we do have a longer drain that we can utilize.  Anderson Malta & Caryl Pina - please coordinate for this pt to come in to [facility] this week for chole tube exchange and upsize.    I would like him to get a 12 Fr, 45 cm length chole tube.  Cathren Harsh   ----- Message ----- From: Jovita Kussmaul, MD Sent: 07/10/2020  11:26 AM EDT To: Sandi Mariscal, MD  Hey this is PJ. I just had a quick question on this guy. He is old and struggles to flush the perc drain on his own. Is there a longer tube that might help him do it on his own? If not that is fine- I told him I would ask. Thanks. PJ

## 2020-08-13 ENCOUNTER — Other Ambulatory Visit (HOSPITAL_COMMUNITY): Payer: Self-pay | Admitting: Student

## 2020-08-13 ENCOUNTER — Ambulatory Visit (HOSPITAL_COMMUNITY)
Admission: RE | Admit: 2020-08-13 | Discharge: 2020-08-13 | Disposition: A | Discharge status: Home / Self Care | Payer: PPO | Source: Ambulatory Visit | Attending: Student | Admitting: Student

## 2020-08-13 ENCOUNTER — Other Ambulatory Visit (HOSPITAL_COMMUNITY): Payer: PPO

## 2020-08-13 ENCOUNTER — Encounter (HOSPITAL_COMMUNITY): Payer: Self-pay

## 2020-08-13 ENCOUNTER — Other Ambulatory Visit: Payer: Self-pay

## 2020-08-13 DIAGNOSIS — Z794 Long term (current) use of insulin: Secondary | ICD-10-CM | POA: Insufficient documentation

## 2020-08-13 DIAGNOSIS — Z79899 Other long term (current) drug therapy: Secondary | ICD-10-CM | POA: Diagnosis not present

## 2020-08-13 DIAGNOSIS — Z7901 Long term (current) use of anticoagulants: Secondary | ICD-10-CM | POA: Insufficient documentation

## 2020-08-13 DIAGNOSIS — Z888 Allergy status to other drugs, medicaments and biological substances status: Secondary | ICD-10-CM | POA: Diagnosis not present

## 2020-08-13 DIAGNOSIS — Z434 Encounter for attention to other artificial openings of digestive tract: Secondary | ICD-10-CM | POA: Diagnosis not present

## 2020-08-13 DIAGNOSIS — K810 Acute cholecystitis: Secondary | ICD-10-CM

## 2020-08-13 HISTORY — PX: IR EXCHANGE BILIARY DRAIN: IMG6046

## 2020-08-13 LAB — GLUCOSE, CAPILLARY: Glucose-Capillary: 127 mg/dL — ABNORMAL HIGH (ref 70–99)

## 2020-08-13 MED ORDER — IOHEXOL 300 MG/ML  SOLN
50.0000 mL | Freq: Once | INTRAMUSCULAR | Status: AC | PRN
  Administered 2020-08-13: 20 mL

## 2020-08-13 MED ORDER — MIDAZOLAM HCL 2 MG/2ML IJ SOLN
INTRAMUSCULAR | Status: AC
  Filled 2020-08-13: qty 4

## 2020-08-13 MED ORDER — FENTANYL CITRATE (PF) 100 MCG/2ML IJ SOLN
INTRAMUSCULAR | Status: AC | PRN
  Administered 2020-08-13 (×2): 25 ug via INTRAVENOUS

## 2020-08-13 MED ORDER — MIDAZOLAM HCL 2 MG/2ML IJ SOLN
INTRAMUSCULAR | Status: AC | PRN
  Administered 2020-08-13 (×2): 1 mg via INTRAVENOUS

## 2020-08-13 MED ORDER — LIDOCAINE HCL (PF) 1 % IJ SOLN
INTRAMUSCULAR | Status: AC | PRN
  Administered 2020-08-13: 10 mL

## 2020-08-13 MED ORDER — SODIUM CHLORIDE 0.9 % IV SOLN: INTRAVENOUS | Status: DC

## 2020-08-13 MED ORDER — LIDOCAINE HCL 1 % IJ SOLN
INTRAMUSCULAR | Status: AC
  Filled 2020-08-13: qty 20

## 2020-08-13 MED ORDER — FENTANYL CITRATE (PF) 100 MCG/2ML IJ SOLN
INTRAMUSCULAR | Status: AC
  Filled 2020-08-13: qty 4

## 2020-08-13 MED ORDER — SODIUM CHLORIDE 0.9 % IV SOLN
2.0000 g | INTRAVENOUS | Status: AC
  Administered 2020-08-13: 2 g via INTRAVENOUS
  Filled 2020-08-13: qty 2

## 2020-08-13 NOTE — Procedures (Signed)
Interventional Radiology Procedure Note  Procedure: Cholecystostomy drain upsize  Indication: Cholecystitis  Findings: Please refer to procedural dictation for full description.  Complications: None  EBL: < 10 mL  Miachel Roux, MD [phone]

## 2020-08-13 NOTE — H&P (Signed)
Chief Complaint: Patient was seen in consultation today for routine exchange with upsize of perc cholecystostomy tube   Supervising Physician: Nathan [name]  Patient Status: [name] - Out-pt  History of Present Illness: Nathan [name] is a 87 y.o. male with past medical history of recurrent cholecystitis with prior tube placement, now with second episode of severe acute cholecystitis with tube replacement.  He has undergone routine exchange in IR 06/19/20, however now with difficulty flushing and participating in care of the drain on his own.  He presents for upsize and lengthening of his drainage catheter.  Nathan [name] presents in his usual state of health today.  He denies concern or issues with his current drain, only that since exchange it is too short for him to flush independently.  He does report falls x2 at home, but recently stopped Flomax which was making him dizzy.  Otherwise he is in his usual state of health. He has been NPO today.   Past Medical History:  Diagnosis Date   Acid reflux    Aortic insufficiency    Cholecystitis    Combined systolic and diastolic heart failure ([place])    Echo 04/2018: inf-lat HK, EF 40-45, severe basal septal hypertrophy, mild conc LVH, Gr 1 DD, severe LAE, mild to mod TR, mod AI, asc Aorta 39 mm   Coronary artery disease 2002   CABG x5   Diabetes mellitus    DVT (deep venous thrombosis) (HCC)    previously on coumadin   Hypercholesteremia    Hyperlipidemia    Mitral valve regurgitation    Skin cancer    tumor removed off of his back    Past Surgical History:  Procedure Laterality Date   BILIARY DRAINAGE CATHETER PLACEMENT W/ BILE DUCT TUBE CHANGE  01/2015   CORONARY ARTERY BYPASS GRAFT  12/21/2000   x5   IR CATHETER TUBE CHANGE  06/18/2020   IR EXCHANGE BILIARY DRAIN  06/18/2020   IR PERC CHOLECYSTOSTOMY  05/02/2020   IR US GUIDANCE  05/02/2020   LEFT HEART CATH AND CORS/GRAFTS ANGIOGRAPHY N/A 04/06/2018   Procedure: LEFT HEART CATH AND  CORS/GRAFTS ANGIOGRAPHY;  Surgeon: Lorretta Harp, MD;  Location: [facility];  Service: Cardiovascular;  Laterality: N/A;   SKIN CANCER EXCISION     TUMOR REMOVAL     off of back, skin cancer    Allergies: Flomax [tamsulosin hcl]  Medications: Prior to Admission medications   Medication Sig Start Date End Date Taking? Authorizing Provider  albuterol (VENTOLIN HFA) 108 (90 Base) MCG/ACT inhaler Inhale 2 puffs into the lungs 2 (two) times daily. 11/13/19  Yes [provider]  apixaban (ELIQUIS) 2.5 MG TABS tablet Take 2.5 mg by mouth 2 (two) times daily.   Yes [provider]  feeding supplement (ENSURE ENLIVE / ENSURE PLUS) LIQD Take 237 mLs by mouth 2 (two) times daily between meals. Patient taking differently: Take 237 mLs by mouth daily. 05/12/20  Yes Geradine Girt, DO  fenofibrate (TRICOR) 145 MG tablet Take 1 tablet (145 mg total) by mouth daily. 03/21/20  Yes Nahser, Wonda Cheng, MD  insulin NPH-regular Human (NOVOLIN 70/30) (70-30) 100 UNIT/ML injection Inject 20-30 Units into the skin See admin instructions. 30 units in the morning, 20 units at dinner Sliding scale   Yes [provider]  isosorbide mononitrate (IMDUR) 30 MG 24 hr tablet TAKE 1 TABLET BY MOUTH EVERY DAY Patient taking differently: Take 30 mg by mouth daily. 06/27/20  Yes Nahser, Wonda Cheng,  MD  megestrol (MEGACE) 40 MG/ML suspension Take 10 mLs by mouth daily. 08/01/20  Yes [provider]  metoprolol succinate (TOPROL-XL) 25 MG 24 hr tablet Take 1 tablet (25 mg total) by mouth daily. 05/13/20  Yes Vann, Jessica U, DO  mirtazapine (REMERON) 7.5 MG tablet Take 1 tablet (7.5 mg total) by mouth at bedtime. 05/12/20  Yes Geradine Girt, DO  Multiple Vitamins-Minerals (CENTRUM SILVER PO) Take 1 tablet by mouth daily.   Yes [provider]  nitroGLYCERIN (NITROSTAT) 0.4 MG SL tablet Place 1 tablet (0.4 mg total) under the tongue every 5 (five) minutes as needed for chest pain.  04/04/18  Yes Weaver, Scott T, PA-C  omeprazole (PRILOSEC) 20 MG capsule Take 20 mg by mouth 2 (two) times daily.  01/06/11  Yes [provider]  Sodium Chloride Flush (NORMAL SALINE FLUSH) 0.9 % SOLN Flush once daily with 10 mls as directed 06/13/20  Yes Corrie Mckusick, DO  Blood Glucose Monitoring Suppl (ONETOUCH VERIO) w/Device KIT  09/06/17   [provider]  Lancets (ONETOUCH ULTRASOFT) lancets  09/06/17   [provider]  ondansetron (ZOFRAN ODT) 4 MG disintegrating tablet Take 1 tablet (4 mg total) by mouth every 8 (eight) hours as needed for up to 15 doses for nausea or vomiting. Patient not taking: Reported on 08/07/2020 07/30/20   Wyvonnia Dusky, MD  [facility] VERIO test strip  09/06/17   [provider]  [place] 1ML/31G 31G X 5/16" 1 ML MISC USE 1 TWICE DAILY AS DIRECTED 09/29/17   [provider]  tamsulosin (FLOMAX) 0.4 MG CAPS capsule Take 1 capsule (0.4 mg total) by mouth daily. Patient not taking: Reported on 08/07/2020 07/30/20 08/29/20  Wyvonnia Dusky, MD     Family History  Problem Relation Age of Onset   Heart attack Father    Hypertension Mother    Diabetes Brother    Cancer Sister        breast   Cancer Brother        throat & mouth    Social History   Socioeconomic History   Marital status: Divorced    Spouse name: Not on file   Number of children: Not on file   Years of education: Not on file   Highest education level: Not on file  Occupational History   Not on file  Tobacco Use   Smoking status: Never   Smokeless tobacco: Never  Vaping Use   Vaping Use: Never used  Substance and Sexual Activity   Alcohol use: No   Drug use: No   Sexual activity: Not Currently  Other Topics Concern   Not on file  Social History Narrative   Not on file   Social Determinants of Health   Financial Resource Strain: Not on file  Food Insecurity: Not on file  Transportation Needs: Not on file  Physical  Activity: Not on file  Stress: Not on file  Social Connections: Not on file     Review of Systems: A 12 point ROS discussed and pertinent positives are indicated in the HPI above.  All other systems are negative.  Review of Systems  Constitutional:  Negative for fatigue and fever.  Respiratory:  Negative for cough and shortness of breath.   Cardiovascular:  Negative for chest pain.  Gastrointestinal:  Negative for abdominal pain, nausea and vomiting.  Musculoskeletal:  Negative for back pain.  Psychiatric/Behavioral:  Negative for behavioral problems.    Vital Signs: BP [name])  147/71   Pulse 77   Temp 97.8 F (36.6 C) (Oral)   Resp 20   Ht _0  (1.803 m)   Wt 158 lb (71.7 kg)   SpO2 98%   BMI 22.04 kg/m   Physical Exam Vitals and nursing note reviewed.  Constitutional:      General: He is not in acute distress.    Appearance: Normal appearance. He is not ill-appearing.  HENT:     Mouth/Throat:     Mouth: Mucous membranes are moist.     Pharynx: Oropharynx is clear.  Cardiovascular:     Rate and Rhythm: Normal rate and regular rhythm.  Pulmonary:     Effort: Pulmonary effort is normal.     Breath sounds: Normal breath sounds.  Skin:    General: Skin is warm and dry.  Neurological:     General: No focal deficit present.     Mental Status: He is alert and oriented to person, place, and time. Mental status is at baseline.  Psychiatric:        Mood and Affect: Mood normal.        Behavior: Behavior normal.        Thought Content: Thought content normal.        Judgment: Judgment normal.     MD Evaluation Airway: WNL Heart: WNL Abdomen: WNL Chest/ Lungs: WNL ASA  Classification: 3 Mallampati/Airway Score: One   Imaging: CT ABDOMEN PELVIS WO CONTRAST  Result Date: 07/30/2020 CLINICAL DATA:  Poor appetite constipation EXAM: CT ABDOMEN AND PELVIS WITHOUT CONTRAST TECHNIQUE: Multidetector CT imaging of the abdomen and pelvis was performed following the standard  protocol without IV contrast. COMPARISON:  CT 05/01/2020 FINDINGS: Lower chest: Lung bases demonstrate no acute consolidation or effusion. Fatty hiatal hernia. Coronary vascular calcification Hepatobiliary: Interval placement of percutaneous cholecystostomy tube with decompressed gallbladder and no right upper quadrant inflammatory change. No biliary dilatation Pancreas: Unremarkable. No pancreatic ductal dilatation or surrounding inflammatory changes. Spleen: Normal in size without focal abnormality. Adrenals/Urinary Tract: Adrenal glands are normal. Mild bilateral renal atrophy. No hydronephrosis. Bilateral renal sinus cysts. Lobulated slightly thick-walled urinary bladder Stomach/Bowel: Stomach is within normal limits. Appendix appears normal. No evidence of bowel wall thickening, distention, or inflammatory changes. Mild diverticular disease of the colon without acute inflammatory changes. Vascular/Lymphatic: Moderate aortic atherosclerosis. No aneurysm. No suspicious nodes Reproductive: Markedly enlarged prostate Other: Negative for pelvic effusion or free air Musculoskeletal: Chronic bilateral pars defect at L5 with anterolisthesis L5 on S1 and advanced degenerative changes. IMPRESSION: 1. No CT evidence for acute intra-abdominal or pelvic abnormality. 2. Interval placement of percutaneous cholecystostomy tube with decompressed gallbladder and no right upper quadrant inflammatory changes. 3. Enlarged prostate with slightly thick-walled lobulated distended bladder suggesting outlet obstruction Electronically Signed   By: Donavan Foil M.D.   On: 07/30/2020 21:40    Labs:  CBC: Recent Labs    05/09/20 0123 05/11/20 0320 05/12/20 0046 07/30/20 1941  WBC 15.1* 15.3* 13.6* 12.4*  HGB 13.8 13.5 13.5 14.3  HCT 41.5 39.4 40.4 43.4  PLT 233 273 310 243    COAGS: Recent Labs    05/02/20 2028 05/03/20 1325 05/04/20 0137  APTT 67* 59* 59*    BMP: Recent Labs    05/09/20 0123 05/11/20 0320  05/12/20 0046 06/25/20 1337 07/30/20 1941  NA 132* 134* 134* 136 136  K 4.7 4.6 4.8 4.8 4.4  CL 101 103 105 103 105  CO2 21* 21* 22 17* 19*  GLUCOSE 248* 185*  172* 283* 154*  BUN 50* 57* 53* 39* 60*  CALCIUM 9.4 9.4 9.1 9.1 10.0  CREATININE 2.96* 2.88* 2.83* 2.40* 3.50*  GFRNONAA 20* 21* 21*  --  17*    LIVER FUNCTION TESTS: Recent Labs    05/07/20 0043 05/08/20 0336 06/12/20 1114 07/30/20 1941  BILITOT 2.0* 1.8* 1.0 1.0  AST 68* 34 72* 51*  ALT 140* 93* 44 43  ALKPHOS 63 61 48 47  PROT 6.9 6.7 7.4 7.6  ALBUMIN 2.7* 2.5* 3.4* 3.7    TUMOR MARKERS: No results for input(s): AFPTM, CEA, CA199, CHROMGRNA in the last 8760 hours.  Assessment and Plan: Patient with past medical history of acute cholecystitis presents for drain exchange and upsize with sedation.   Case reviewed by Dr. Dwaine Gale who approves patient for procedure.  Patient presents today in their usual state of health.  He has been NPO.   Risks and benefits discussed with the patient including, but not limited to bleeding, infection, gallbladder perforation, bile leak, sepsis or even death.  All of the patient's questions were answered, patient is agreeable to proceed. Consent signed and in chart.  Thank you for this interesting consult.  I greatly enjoyed meeting Nathan [name] and look forward to participating in their care.  A copy of this report was sent to the requesting provider on this date.  Electronically Signed: Docia Barrier, PA 08/13/2020, 9:03 AM   I spent a total of  30 Minutes   in face to face in clinical consultation, greater than 50% of which was counseling/coordinating care for cholecystitis.

## 2020-08-20 ENCOUNTER — Other Ambulatory Visit (HOSPITAL_COMMUNITY): Payer: Self-pay

## 2020-08-20 DIAGNOSIS — R3914 Feeling of incomplete bladder emptying: Secondary | ICD-10-CM | POA: Diagnosis not present

## 2020-08-20 DIAGNOSIS — R8271 Bacteriuria: Secondary | ICD-10-CM | POA: Diagnosis not present

## 2020-08-20 DIAGNOSIS — R3912 Poor urinary stream: Secondary | ICD-10-CM | POA: Diagnosis not present

## 2020-08-20 DIAGNOSIS — N401 Enlarged prostate with lower urinary tract symptoms: Secondary | ICD-10-CM | POA: Diagnosis not present

## 2020-09-12 ENCOUNTER — Encounter: Payer: Self-pay | Admitting: Podiatry

## 2020-09-12 ENCOUNTER — Other Ambulatory Visit: Payer: Self-pay

## 2020-09-12 ENCOUNTER — Ambulatory Visit: Payer: PPO | Admitting: Podiatry

## 2020-09-12 DIAGNOSIS — M79675 Pain in left toe(s): Secondary | ICD-10-CM

## 2020-09-12 DIAGNOSIS — E1121 Type 2 diabetes mellitus with diabetic nephropathy: Secondary | ICD-10-CM | POA: Diagnosis not present

## 2020-09-12 DIAGNOSIS — I872 Venous insufficiency (chronic) (peripheral): Secondary | ICD-10-CM

## 2020-09-12 DIAGNOSIS — Z794 Long term (current) use of insulin: Secondary | ICD-10-CM | POA: Diagnosis not present

## 2020-09-12 DIAGNOSIS — E118 Type 2 diabetes mellitus with unspecified complications: Secondary | ICD-10-CM | POA: Diagnosis not present

## 2020-09-12 DIAGNOSIS — M79674 Pain in right toe(s): Secondary | ICD-10-CM | POA: Diagnosis not present

## 2020-09-12 DIAGNOSIS — I25810 Atherosclerosis of coronary artery bypass graft(s) without angina pectoris: Secondary | ICD-10-CM | POA: Diagnosis not present

## 2020-09-12 DIAGNOSIS — I351 Nonrheumatic aortic (valve) insufficiency: Secondary | ICD-10-CM | POA: Diagnosis not present

## 2020-09-12 DIAGNOSIS — I130 Hypertensive heart and chronic kidney disease with heart failure and stage 1 through stage 4 chronic kidney disease, or unspecified chronic kidney disease: Secondary | ICD-10-CM | POA: Diagnosis not present

## 2020-09-12 DIAGNOSIS — N184 Chronic kidney disease, stage 4 (severe): Secondary | ICD-10-CM | POA: Diagnosis not present

## 2020-09-12 DIAGNOSIS — I5042 Chronic combined systolic (congestive) and diastolic (congestive) heart failure: Secondary | ICD-10-CM | POA: Diagnosis not present

## 2020-09-12 DIAGNOSIS — B351 Tinea unguium: Secondary | ICD-10-CM | POA: Diagnosis not present

## 2020-09-12 DIAGNOSIS — I700 Atherosclerosis of aorta: Secondary | ICD-10-CM | POA: Diagnosis not present

## 2020-09-25 DIAGNOSIS — D229 Melanocytic nevi, unspecified: Secondary | ICD-10-CM | POA: Diagnosis not present

## 2020-09-25 DIAGNOSIS — Z85828 Personal history of other malignant neoplasm of skin: Secondary | ICD-10-CM | POA: Diagnosis not present

## 2020-09-25 DIAGNOSIS — L821 Other seborrheic keratosis: Secondary | ICD-10-CM | POA: Diagnosis not present

## 2020-09-25 DIAGNOSIS — L570 Actinic keratosis: Secondary | ICD-10-CM | POA: Diagnosis not present

## 2020-09-25 DIAGNOSIS — L905 Scar conditions and fibrosis of skin: Secondary | ICD-10-CM | POA: Diagnosis not present

## 2020-10-07 ENCOUNTER — Other Ambulatory Visit (HOSPITAL_COMMUNITY): Payer: Self-pay | Admitting: Interventional Radiology

## 2020-10-07 DIAGNOSIS — K810 Acute cholecystitis: Secondary | ICD-10-CM

## 2020-10-09 ENCOUNTER — Other Ambulatory Visit: Payer: Self-pay | Admitting: Radiology

## 2020-10-13 ENCOUNTER — Ambulatory Visit (HOSPITAL_COMMUNITY)
Admission: RE | Admit: 2020-10-13 | Discharge: 2020-10-13 | Disposition: A | Discharge status: Home / Self Care | Payer: PPO | Source: Ambulatory Visit | Attending: Interventional Radiology | Admitting: Interventional Radiology

## 2020-10-13 ENCOUNTER — Encounter (HOSPITAL_COMMUNITY): Payer: Self-pay

## 2020-10-13 ENCOUNTER — Other Ambulatory Visit: Payer: Self-pay

## 2020-10-13 DIAGNOSIS — I129 Hypertensive chronic kidney disease with stage 1 through stage 4 chronic kidney disease, or unspecified chronic kidney disease: Secondary | ICD-10-CM | POA: Diagnosis not present

## 2020-10-13 DIAGNOSIS — K810 Acute cholecystitis: Secondary | ICD-10-CM

## 2020-10-13 DIAGNOSIS — N400 Enlarged prostate without lower urinary tract symptoms: Secondary | ICD-10-CM | POA: Diagnosis not present

## 2020-10-13 DIAGNOSIS — N1832 Chronic kidney disease, stage 3b: Secondary | ICD-10-CM | POA: Diagnosis not present

## 2020-10-13 DIAGNOSIS — I5042 Chronic combined systolic (congestive) and diastolic (congestive) heart failure: Secondary | ICD-10-CM | POA: Diagnosis not present

## 2020-10-13 DIAGNOSIS — E1122 Type 2 diabetes mellitus with diabetic chronic kidney disease: Secondary | ICD-10-CM | POA: Diagnosis not present

## 2020-10-13 DIAGNOSIS — R809 Proteinuria, unspecified: Secondary | ICD-10-CM | POA: Diagnosis not present

## 2020-10-16 ENCOUNTER — Other Ambulatory Visit: Payer: Self-pay | Admitting: Radiology

## 2020-10-21 ENCOUNTER — Ambulatory Visit (HOSPITAL_COMMUNITY)
Admission: RE | Admit: 2020-10-21 | Discharge: 2020-10-21 | Disposition: A | Discharge status: Home / Self Care | Payer: PPO | Source: Ambulatory Visit | Attending: Interventional Radiology | Admitting: Interventional Radiology

## 2020-10-21 ENCOUNTER — Other Ambulatory Visit: Payer: Self-pay

## 2020-10-21 DIAGNOSIS — K810 Acute cholecystitis: Secondary | ICD-10-CM | POA: Insufficient documentation

## 2020-10-21 DIAGNOSIS — Z434 Encounter for attention to other artificial openings of digestive tract: Secondary | ICD-10-CM | POA: Diagnosis not present

## 2020-10-21 HISTORY — PX: IR EXCHANGE BILIARY DRAIN: IMG6046

## 2020-10-21 MED ORDER — IOHEXOL 240 MG/ML SOLN
50.0000 mL | Freq: Once | INTRAMUSCULAR | Status: AC | PRN
  Administered 2020-10-21: 10 mL

## 2020-10-21 MED ORDER — LIDOCAINE HCL (PF) 1 % IJ SOLN
INTRAMUSCULAR | Status: DC | PRN
  Administered 2020-10-21: 5 mL

## 2020-10-21 MED ORDER — LIDOCAINE HCL 1 % IJ SOLN
INTRAMUSCULAR | Status: AC
  Filled 2020-10-21: qty 20

## 2020-10-21 NOTE — Procedures (Signed)
Interventional Radiology Procedure Note  Procedure: Image guided exchange of 90F perc chole.  Standard pigtail placed per patient preference.   Complications: None Recommendations: - Routine drain care,    Signed,  Dulcy Fanny. Earleen Newport, DO

## 2020-10-22 ENCOUNTER — Other Ambulatory Visit: Payer: Self-pay | Admitting: Interventional Radiology

## 2020-10-22 DIAGNOSIS — K810 Acute cholecystitis: Secondary | ICD-10-CM

## 2020-10-24 ENCOUNTER — Ambulatory Visit
Admission: RE | Admit: 2020-10-24 | Discharge: 2020-10-24 | Disposition: A | Discharge status: Home / Self Care | Payer: PPO | Source: Ambulatory Visit | Attending: Interventional Radiology | Admitting: Interventional Radiology

## 2020-10-24 ENCOUNTER — Encounter: Payer: Self-pay | Admitting: *Deleted

## 2020-10-24 ENCOUNTER — Other Ambulatory Visit: Payer: Self-pay

## 2020-10-24 DIAGNOSIS — K8063 Calculus of gallbladder and bile duct with acute cholecystitis with obstruction: Secondary | ICD-10-CM | POA: Diagnosis not present

## 2020-10-24 DIAGNOSIS — K810 Acute cholecystitis: Secondary | ICD-10-CM

## 2020-10-24 HISTORY — PX: IR RADIOLOGIST EVAL & MGMT: IMG5224

## 2020-10-24 NOTE — Consult Note (Signed)
Chief Complaint: Cholecystitis  Referring Physician(s): Dr. Damita Dunnings  History of Present Illness: Nathan [name] is a 87 y.o. male With extensive cardiac history, had a cholecystostomy drain placed on 05/02/2020 for treatment of acute cholecystitis.  His symptoms have resolved since that time, and he denies any current right upper quadrant pain, nausea, or vomiting.  His appetite has remained poor with decreased oral intake.    He is not a surgical candidate due to his cardiac comorbidities.  Today we discussed the possibility of percutaneous stone extraction with Spyglass.   Past Medical History:  Diagnosis Date   Acid reflux    Aortic insufficiency    Cholecystitis    Combined systolic and diastolic heart failure ([place])    Echo 04/2018: inf-lat HK, EF 40-45, severe basal septal hypertrophy, mild conc LVH, Gr 1 DD, severe LAE, mild to mod TR, mod AI, asc Aorta 39 mm   Coronary artery disease 2002   CABG x5   Diabetes mellitus    DVT (deep venous thrombosis) ([place])    previously on coumadin   Hypercholesteremia    Hyperlipidemia    Mitral valve regurgitation    Skin cancer    tumor removed off of his back    Past Surgical History:  Procedure Laterality Date   BILIARY DRAINAGE CATHETER PLACEMENT W/ BILE DUCT TUBE CHANGE  01/2015   CORONARY ARTERY BYPASS GRAFT  12/21/2000   x5   IR CATHETER TUBE CHANGE  06/18/2020   IR EXCHANGE BILIARY DRAIN  06/18/2020   IR EXCHANGE BILIARY DRAIN  08/13/2020   IR EXCHANGE BILIARY DRAIN  10/21/2020   IR PERC CHOLECYSTOSTOMY  05/02/2020   IR RADIOLOGIST EVAL & MGMT  10/24/2020   IR US GUIDANCE  05/02/2020   LEFT HEART CATH AND CORS/GRAFTS ANGIOGRAPHY N/A 04/06/2018   Procedure: LEFT HEART CATH AND CORS/GRAFTS ANGIOGRAPHY;  Surgeon: Lorretta Harp, MD;  Location: [facility];  Service: Cardiovascular;  Laterality: N/A;   SKIN CANCER EXCISION     TUMOR REMOVAL     off of back, skin cancer    Allergies: Flomax [tamsulosin  hcl]  Medications: Prior to Admission medications   Medication Sig Start Date End Date Taking? Authorizing Provider  albuterol (VENTOLIN HFA) 108 (90 Base) MCG/ACT inhaler Inhale 2 puffs into the lungs 2 (two) times daily. 11/13/19   [provider]  apixaban (ELIQUIS) 2.5 MG TABS tablet Take 2.5 mg by mouth 2 (two) times daily.    [provider]  Blood Glucose Monitoring Suppl (ONETOUCH VERIO) w/Device KIT  09/06/17   [provider]  feeding supplement (ENSURE ENLIVE / ENSURE PLUS) LIQD Take 237 mLs by mouth 2 (two) times daily between meals. Patient not taking: Reported on 10/08/2020 05/12/20   Geradine Girt, DO  fenofibrate (TRICOR) 145 MG tablet Take 1 tablet (145 mg total) by mouth daily. 03/21/20   Nahser, Wonda Cheng, MD  finasteride (PROSCAR) 5 MG tablet Take 5 mg by mouth daily.    [provider]  insulin NPH-regular Human (NOVOLIN 70/30) (70-30) 100 UNIT/ML injection Inject 10-25 Units into the skin See admin instructions. 25 units in the morning, 10 units at dinner Sliding scale    [provider]  isosorbide mononitrate (IMDUR) 30 MG 24 hr tablet TAKE 1 TABLET BY MOUTH EVERY DAY Patient taking differently: Take 30 mg by mouth daily. 06/27/20   Nahser, Wonda Cheng, MD  Lancets Glory Rosebush ULTRASOFT) lancets  09/06/17   [provider]  megestrol (MEGACE) 20 MG tablet Take 20 mg by mouth daily. 08/01/20   [provider]  metoprolol succinate (TOPROL-XL) 25 MG 24 hr tablet Take 1 tablet (25 mg total) by mouth daily. 05/13/20   Geradine Girt, DO  mirtazapine (REMERON) 7.5 MG tablet Take 1 tablet (7.5 mg total) by mouth at bedtime. Patient not taking: Reported on 10/08/2020 05/12/20   Geradine Girt, DO  Multiple Vitamins-Minerals (CENTRUM SILVER PO) Take 1 tablet by mouth daily.    [provider]  nitroGLYCERIN (NITROSTAT) 0.4 MG SL tablet Place 1 tablet (0.4 mg total) under the tongue every 5 (five) minutes as needed for  chest pain. 04/04/18   Richardson Dopp T, PA-C  omeprazole (PRILOSEC) 20 MG capsule Take 20 mg by mouth 2 (two) times daily.  01/06/11   [provider]  ondansetron (ZOFRAN ODT) 4 MG disintegrating tablet Take 1 tablet (4 mg total) by mouth every 8 (eight) hours as needed for up to 15 doses for nausea or vomiting. Patient not taking: Reported on 10/08/2020 07/30/20   Wyvonnia Dusky, MD  [facility] VERIO test strip  09/06/17   [provider]  [place] 1ML/31G 31G X 5/16" 1 ML MISC USE 1 TWICE DAILY AS DIRECTED 09/29/17   [provider]  Sodium Chloride Flush (NORMAL SALINE FLUSH) 0.9 % SOLN Flush once daily with 10 mls as directed 06/13/20   Corrie Mckusick, DO     Family History  Problem Relation Age of Onset   Heart attack Father    Hypertension Mother    Diabetes Brother    Cancer Sister        breast   Cancer Brother        throat & mouth   Review of Systems  Review of Systems: A 12 point ROS discussed and pertinent positives are indicated in the HPI above.  All other systems are negative.  Physical Exam No direct physical exam was performed (except for noted visual exam findings with Video Visits).    Vital Signs: There were no vitals taken for this visit.  Imaging: IR EXCHANGE BILIARY DRAIN  Result Date: 10/21/2020 INDICATION: 87 year old male with a history of cholecystostomy, presents for routine exchange EXAM: IMAGE GUIDED DRAIN EXCHANGE MEDICATIONS: NONE ANESTHESIA/SEDATION: None FLUOROSCOPY TIME:  Fluoroscopy Time: 0 minutes 6 seconds (0.5 mGy). COMPLICATIONS: None PROCEDURE: Informed written consent was obtained from the patient after a thorough discussion of the procedural risks, benefits and alternatives. All questions were addressed. Maximal Sterile Barrier Technique was utilized including caps, mask, sterile gowns, sterile gloves, sterile drape, hand hygiene and skin antiseptic. A timeout was performed prior to the initiation of the  procedure. Patient positioned supine position under the image intensifier. Right upper quadrant including the indwelling drain were prepped and draped in the usual sterile fashion. 1% lidocaine was used for local anesthesia. Contrast was injected confirming location. The catheter was ligated and modified Seldinger technique was used to exchange the drain for a 12 French pigtail drainage catheter. Contrast injection confirmed position of the tube within the gallbladder lumen. Drainage catheter was attached to gravity drain with a suture retention placed. Patient tolerated the procedure well and remained hemodynamically stable throughout. No complications were encountered and no significant blood loss encountered. IMPRESSION: Status post routine exchange of percutaneous cholecystostomy. Signed, Dulcy Fanny. Dellia Nims, RPVI [name] Electronically Signed   By: Corrie Mckusick D.O.   On: 10/21/2020 17:10   IR Radiologist  Eval & Mgmt  Result Date: 10/24/2020 Please refer to notes tab for details about interventional procedure. (Op Note)   Labs:  CBC: Recent Labs    05/09/20 0123 05/11/20 0320 05/12/20 0046 07/30/20 1941  WBC 15.1* 15.3* 13.6* 12.4*  HGB 13.8 13.5 13.5 14.3  HCT 41.5 39.4 40.4 43.4  PLT 233 273 310 243    COAGS: Recent Labs    05/02/20 2028 05/03/20 1325 05/04/20 0137  APTT 67* 59* 59*    BMP: Recent Labs    05/09/20 0123 05/11/20 0320 05/12/20 0046 06/25/20 1337 07/30/20 1941  NA 132* 134* 134* 136 136  K 4.7 4.6 4.8 4.8 4.4  CL 101 103 105 103 105  CO2 21* 21* 22 17* 19*  GLUCOSE 248* 185* 172* 283* 154*  BUN 50* 57* 53* 39* 60*  CALCIUM 9.4 9.4 9.1 9.1 10.0  CREATININE 2.96* 2.88* 2.83* 2.40* 3.50*  GFRNONAA 20* 21* 21*  --  17*    LIVER FUNCTION TESTS: Recent Labs    05/07/20 0043 05/08/20 0336 06/12/20 1114 07/30/20 1941  BILITOT 2.0* 1.8* 1.0 1.0  AST 68* 34 72* 51*  ALT 140* 93* 44 43   ALKPHOS 63 61 48 47  PROT 6.9 6.7 7.4 7.6  ALBUMIN 2.7* 2.5* 3.4* 3.7    TUMOR MARKERS: No results for input(s): AFPTM, CEA, CA199, CHROMGRNA in the last 8760 hours.  Assessment and Plan:  87 year old gentleman with history of calculus cholecystitis, treated by placement of cholecystostomy drain on 05/02/2020.  Patient has had cholecystostomy drain since that time.  Most recent cholangiogram demonstrated obstructing calculi at the level of the common bile duct.  He is a poor surgical candidate due to is cardiac comorbidities.    I believe he is a good candidate for percutaneous stone extraction with Spyglass.  I explained that the procedure would involve [place] using the existing percutaneous access tract to insert a camera and other equipment into the gallbladder and bile ducts in order to extract the stones.  I explained that this may require multiple sessions.  If all stones could be successfully removed, the drain would still have to stay in place for multiple weeks to allow for inflammation to decrease and a capping trial to take place.  I explained to him that we may be unsuccessful in removing all of the stones and he may still need the drain after the procedure.  We also discussed the risks of the procedure including bleeding, infection, and damage to gallbladder, bile ducts, and nearby structures.    He understands the risk and benefits of the procedure and wanted to proceed with biliary stone extraction.   Thank you for this interesting consult.  I greatly enjoyed meeting Nathan [name] and look forward to participating in their care.  A copy of this report was sent to the requesting provider on this date.  Electronically Signed: Paula Libra Lenvil Swaim 10/24/2020, 3:41 PM   I spent a total of  30 Minutes  in remote  clinical consultation, greater than 50% of which was counseling/coordinating care for cholecystitis.    Visit type: Audio only (telephone). Audio (no video) only due to patient's lack  of internet/smartphone capability. Alternative for in-person consultation at [facility], [address], [place], [place]. This visit type was conducted due to national recommendations for restrictions regarding the COVID-19 Pandemic (e.g. social distancing).  This format is felt to be most appropriate for this patient at this time.  All issues noted in this document  were discussed and addressed.

## 2020-11-04 ENCOUNTER — Telehealth: Payer: Self-pay | Admitting: Cardiovascular Disease

## 2020-11-04 NOTE — Telephone Encounter (Signed)
Radiology reaching out to see if pt is able to hold his Eliquis 48 hours prior to procedure... please advise

## 2020-11-05 NOTE — Telephone Encounter (Signed)
   Name: Nathan [name]  DOB: 06/11/1933  MRN: [id number]   Primary Cardiologist: [name], MD  Chart reviewed as part of pre-operative protocol coverage. Patient was contacted 11/05/2020 in reference to pre-operative risk assessment for pending surgery as outlined below.  [name] was last seen on 06/2020 by Dr. Acie Fredrickson, chart reviewed. Very medically complex patient including CAD s/p CABG, valvular heart disease, advanced kidney disease, amongst other issues - had NSTEMI in the hospital 04/2020 managed medically. Historically deconditioned per chart. He was previously not felt to be a surgical candidate for other gallbladder procedure in the past. I spoke with the patient who reports he is doing OK - about the same - chronic dyspnea but no chest pain. Patient is hopeful this procedure will help with his recurrent gallbladder issues. Given comorbidities and that he cannot perform 4 METS without getting SOB, will route to Dr. Acie Fredrickson for final input on clearance. Dr. Acie Fredrickson - - Please route response to P CV DIV PREOP (the pre-op pool). Thank you.  Surgical team: We do not manage this patient's Eliquis. He is on Eliquis due to hx of DVT, not due to cardiac reasons. Therefore would recommend reaching out to PCP for clearance on holding. Patient states it is Dr. Sharlett Iles who manages this. I will route this update to requesting surgeon but we will send a final clearance note once we hear from Dr. Acie Fredrickson.  Charlie Pitter, PA-C 11/05/2020, 1:39 PM

## 2020-11-05 NOTE — Telephone Encounter (Signed)
Returned the call to [facility] @ Radiology.  Left her a detailed message that we have no record of pt having a procedure and that we will need a formal surgical clearance form and she could fax it (# provided) or she could call it in (# provided).

## 2020-11-05 NOTE — Telephone Encounter (Signed)
   [facility] Pre-operative Risk Assessment    Patient Name: Nathan [name]  DOB: 1934-01-26 MRN: [id number]  HEARTCARE STAFF:  - IMPORTANT!!!!!! Under Visit Info/Reason for Call, type in Other and utilize the format Clearance MM/DD/YY or Clearance TBD. Do not use dashes or single digits. - Please review there is not already an duplicate clearance open for this procedure. - If request is for dental extraction, please clarify the # of teeth to be extracted. - If the patient is currently at the dentist's office, call Pre-Op Callback Staff (MA/nurse) to input urgent request.  - If the patient is not currently in the dentist office, please route to the Pre-Op pool.  Request for surgical clearance:  What type of surgery is being performed?  GALLSTONE LITHOTRIPSY   When is this surgery scheduled?  11/14/20  What type of clearance is required (medical clearance vs. Pharmacy clearance to hold med vs. Both)?   PHARMACY  Are there any medications that need to be held prior to surgery and how long?  ELIQUIS X'S 63 HOURS  Practice name and name of physician performing surgery?  [facility] / DR. SUTTLE  What is the office phone number?  [phone]   7.   What is the office fax number?  [phone]  8.   Anesthesia type (None, local, MAC, general) ?     Nathan [name] 11/05/2020, 1:22 PM  _________________________________________________________________   (provider comments below)

## 2020-11-06 ENCOUNTER — Telehealth (HOSPITAL_COMMUNITY): Payer: Self-pay

## 2020-11-06 NOTE — Telephone Encounter (Signed)
Spoke to pt's daughter about scheduling spyglass/gallstone retrieval. Received clearance from Dr. Philip Aspen to hold Eliquis 48 hrs prior to procedure. Will wait to get final clearance from Dr. Acie Fredrickson to proceed. Daughter is in agreement with this plan. AW

## 2020-11-10 ENCOUNTER — Other Ambulatory Visit (HOSPITAL_COMMUNITY): Payer: Self-pay | Admitting: Interventional Radiology

## 2020-11-10 DIAGNOSIS — K810 Acute cholecystitis: Secondary | ICD-10-CM

## 2020-11-10 NOTE — Telephone Encounter (Signed)
   Patient Name: Nathan [name]  DOB: 1934/01/06 MRN: [id number]  Primary Cardiologist: [name], MD  Chart reviewed as part of pre-operative protocol coverage.   Per Dr. Elmarie Shiley reply, "Pt is at some increased risk for his gall stone lithotripsy procedure but appears to be stable enough to have the proecdure. I've talked with him by phone and he is doing about the same as he was when I saw him in May.  Ok to hold eliquis for 48 hours as noted previously ."  Please note he is on Eliquis due to hx of DVT, not due to cardiac reasons. In our note faxed last week we suggested reaching out to PCP for clearance on holding as well. Patient previously stated it is Dr. Sharlett Iles who manages this.   Will route this bundled recommendation to requesting provider via Epic fax function. Please call with questions.   Charlie Pitter, PA-C 11/10/2020, 9:50 AM

## 2020-11-17 DIAGNOSIS — R8271 Bacteriuria: Secondary | ICD-10-CM | POA: Diagnosis not present

## 2020-11-17 DIAGNOSIS — N401 Enlarged prostate with lower urinary tract symptoms: Secondary | ICD-10-CM | POA: Diagnosis not present

## 2020-11-17 DIAGNOSIS — R8279 Other abnormal findings on microbiological examination of urine: Secondary | ICD-10-CM | POA: Diagnosis not present

## 2020-11-17 DIAGNOSIS — R3914 Feeling of incomplete bladder emptying: Secondary | ICD-10-CM | POA: Diagnosis not present

## 2020-11-17 DIAGNOSIS — R3912 Poor urinary stream: Secondary | ICD-10-CM | POA: Diagnosis not present

## 2020-11-18 ENCOUNTER — Other Ambulatory Visit (HOSPITAL_COMMUNITY): Payer: Self-pay | Admitting: Physician Assistant

## 2020-11-19 ENCOUNTER — Other Ambulatory Visit (HOSPITAL_COMMUNITY): Payer: Self-pay | Admitting: Physician Assistant

## 2020-11-19 ENCOUNTER — Other Ambulatory Visit: Payer: Self-pay | Admitting: Internal Medicine

## 2020-11-20 ENCOUNTER — Ambulatory Visit (HOSPITAL_COMMUNITY)
Admission: RE | Admit: 2020-11-20 | Discharge: 2020-11-20 | Disposition: A | Discharge status: Home / Self Care | Payer: PPO | Source: Ambulatory Visit | Attending: Interventional Radiology | Admitting: Interventional Radiology

## 2020-11-20 ENCOUNTER — Other Ambulatory Visit: Payer: Self-pay

## 2020-11-20 DIAGNOSIS — Z951 Presence of aortocoronary bypass graft: Secondary | ICD-10-CM | POA: Diagnosis not present

## 2020-11-20 DIAGNOSIS — I5042 Chronic combined systolic (congestive) and diastolic (congestive) heart failure: Secondary | ICD-10-CM | POA: Insufficient documentation

## 2020-11-20 DIAGNOSIS — I080 Rheumatic disorders of both mitral and aortic valves: Secondary | ICD-10-CM | POA: Insufficient documentation

## 2020-11-20 DIAGNOSIS — E785 Hyperlipidemia, unspecified: Secondary | ICD-10-CM | POA: Diagnosis not present

## 2020-11-20 DIAGNOSIS — K810 Acute cholecystitis: Secondary | ICD-10-CM

## 2020-11-20 DIAGNOSIS — K219 Gastro-esophageal reflux disease without esophagitis: Secondary | ICD-10-CM | POA: Insufficient documentation

## 2020-11-20 DIAGNOSIS — Z4803 Encounter for change or removal of drains: Secondary | ICD-10-CM | POA: Insufficient documentation

## 2020-11-20 DIAGNOSIS — Z79899 Other long term (current) drug therapy: Secondary | ICD-10-CM | POA: Diagnosis not present

## 2020-11-20 DIAGNOSIS — Z7901 Long term (current) use of anticoagulants: Secondary | ICD-10-CM | POA: Insufficient documentation

## 2020-11-20 DIAGNOSIS — I2510 Atherosclerotic heart disease of native coronary artery without angina pectoris: Secondary | ICD-10-CM | POA: Diagnosis not present

## 2020-11-20 DIAGNOSIS — Z794 Long term (current) use of insulin: Secondary | ICD-10-CM | POA: Diagnosis not present

## 2020-11-20 DIAGNOSIS — E119 Type 2 diabetes mellitus without complications: Secondary | ICD-10-CM | POA: Insufficient documentation

## 2020-11-20 DIAGNOSIS — K8064 Calculus of gallbladder and bile duct with chronic cholecystitis without obstruction: Secondary | ICD-10-CM | POA: Insufficient documentation

## 2020-11-20 DIAGNOSIS — K8050 Calculus of bile duct without cholangitis or cholecystitis without obstruction: Secondary | ICD-10-CM | POA: Diagnosis not present

## 2020-11-20 DIAGNOSIS — Z888 Allergy status to other drugs, medicaments and biological substances status: Secondary | ICD-10-CM | POA: Insufficient documentation

## 2020-11-20 HISTORY — PX: IR EXCHANGE BILIARY DRAIN: IMG6046

## 2020-11-20 HISTORY — PX: IR REMOVAL OF CALCULI/DEBRIS BILIARY DUCT/GB: IMG6054

## 2020-11-20 LAB — COMPREHENSIVE METABOLIC PANEL
ALT: 25 U/L (ref 0–44)
AST: 35 U/L (ref 15–41)
Albumin: 2.7 g/dL — ABNORMAL LOW (ref 3.5–5.0)
Alkaline Phosphatase: 52 U/L (ref 38–126)
Anion gap: 8 (ref 5–15)
BUN: 29 mg/dL — ABNORMAL HIGH (ref 8–23)
CO2: 20 mmol/L — ABNORMAL LOW (ref 22–32)
Calcium: 9.2 mg/dL (ref 8.9–10.3)
Chloride: 110 mmol/L (ref 98–111)
Creatinine, Ser: 2.04 mg/dL — ABNORMAL HIGH (ref 0.61–1.24)
GFR, Estimated: 31 mL/min — ABNORMAL LOW (ref >60)
Glucose, Bld: 178 mg/dL — ABNORMAL HIGH (ref 70–99)
Potassium: 4.2 mmol/L (ref 3.5–5.1)
Sodium: 138 mmol/L (ref 135–145)
Total Bilirubin: 1 mg/dL (ref 0.3–1.2)
Total Protein: 6.1 g/dL — ABNORMAL LOW (ref 6.5–8.1)

## 2020-11-20 LAB — CBC WITH DIFFERENTIAL/PLATELET
Abs Immature Granulocytes: 0.02 10*3/uL (ref 0.00–0.07)
Basophils Absolute: 0 10*3/uL (ref 0.0–0.1)
Basophils Relative: 0 %
Eosinophils Absolute: 0.2 10*3/uL (ref 0.0–0.5)
Eosinophils Relative: 2 %
HCT: 36.4 % — ABNORMAL LOW (ref 39.0–52.0)
Hemoglobin: 11.7 g/dL — ABNORMAL LOW (ref 13.0–17.0)
Immature Granulocytes: 0 %
Lymphocytes Relative: 29 %
Lymphs Abs: 2.6 10*3/uL (ref 0.7–4.0)
MCH: 31 pg (ref 26.0–34.0)
MCHC: 32.1 g/dL (ref 30.0–36.0)
MCV: 96.6 fL (ref 80.0–100.0)
Monocytes Absolute: 0.7 10*3/uL (ref 0.1–1.0)
Monocytes Relative: 8 %
Neutro Abs: 5.3 10*3/uL (ref 1.7–7.7)
Neutrophils Relative %: 61 %
Platelets: 228 10*3/uL (ref 150–400)
RBC: 3.77 MIL/uL — ABNORMAL LOW (ref 4.22–5.81)
RDW: 13.8 % (ref 11.5–15.5)
WBC: 8.9 10*3/uL (ref 4.0–10.5)
nRBC: 0 % (ref 0.0–0.2)

## 2020-11-20 LAB — PROTIME-INR
INR: 1.3 — ABNORMAL HIGH (ref 0.8–1.2)
Prothrombin Time: 16.1 seconds — ABNORMAL HIGH (ref 11.4–15.2)

## 2020-11-20 LAB — GLUCOSE, CAPILLARY: Glucose-Capillary: 132 mg/dL — ABNORMAL HIGH (ref 70–99)

## 2020-11-20 MED ORDER — MIDAZOLAM HCL 2 MG/2ML IJ SOLN
INTRAMUSCULAR | Status: DC | PRN
  Administered 2020-11-20 (×2): .5 mg via INTRAVENOUS
  Administered 2020-11-20: 1 mg via INTRAVENOUS
  Administered 2020-11-20 (×3): .5 mg via INTRAVENOUS

## 2020-11-20 MED ORDER — LIDOCAINE HCL 1 % IJ SOLN
INTRAMUSCULAR | Status: AC
  Filled 2020-11-20: qty 20

## 2020-11-20 MED ORDER — MIDAZOLAM HCL 2 MG/2ML IJ SOLN
INTRAMUSCULAR | Status: AC
  Filled 2020-11-20: qty 2

## 2020-11-20 MED ORDER — SODIUM CHLORIDE 0.9 % IV SOLN: INTRAVENOUS | Status: DC

## 2020-11-20 MED ORDER — FENTANYL CITRATE (PF) 100 MCG/2ML IJ SOLN
INTRAMUSCULAR | Status: AC
  Filled 2020-11-20: qty 2

## 2020-11-20 MED ORDER — FENTANYL CITRATE (PF) 100 MCG/2ML IJ SOLN
INTRAMUSCULAR | Status: DC | PRN
  Administered 2020-11-20 (×5): 25 ug via INTRAVENOUS

## 2020-11-20 MED ORDER — IOHEXOL 350 MG/ML SOLN
100.0000 mL | Freq: Once | INTRAVENOUS | Status: AC | PRN
  Administered 2020-11-20: 25 mL

## 2020-11-20 MED ORDER — SODIUM CHLORIDE 0.9 % IV SOLN
2.0000 g | INTRAVENOUS | Status: AC
  Administered 2020-11-20: 2 g via INTRAVENOUS
  Filled 2020-11-20 (×2): qty 20

## 2020-11-20 NOTE — Progress Notes (Signed)
Discharge instructions reviewed with pt and his daughter. Both voice understanding.  

## 2020-11-20 NOTE — Procedures (Signed)
Interventional Radiology Procedure Note  Procedure: CBD and Gallbladder stone extraction  Indication: Cholelithiasis and Choledocholithiasis   Findings: Please refer to procedural dictation for full description.  Complications: None  EBL: < 10 mL  Miachel Roux, MD [phone]

## 2020-11-20 NOTE — H&P (Signed)
Referring Physician(s): Paterson,D/Toth,P  Supervising Physician: Mir, Biochemist, clinical  Patient Status:  [facility] OP  Chief Complaint:  Calculous cholecystitis  Subjective: Pt known to IR service from [place] drain placement in 2016 followed by removal in 2017, new GB drain placement on 05/02/20 and latest exchange on 10/21/20. He has a hx of calculous cholecystitis, GERD, CHF, aortic insufficiency, CAD with prior CABG, LE DVT, DM, HLD, and MVR. He underwent consultation with Dr. Dwaine Gale on 10/24/20 to discuss image guided perc gallstone retrieval with Spyglass visualization and was deemed an appropriate candidate for the procedure. He is a poor surgical candidate due to cardiac comorbidities. He presents today for the procedure. He denies fever, CP, N/V; he  does have occ HA, some difficulty urinating, constipation, and occ hemorrhoidal bleeding.   Past Medical History:  Diagnosis Date   Acid reflux    Aortic insufficiency    Cholecystitis    Combined systolic and diastolic heart failure ([place])    Echo 04/2018: inf-lat HK, EF 40-45, severe basal septal hypertrophy, mild conc LVH, Gr 1 DD, severe LAE, mild to mod TR, mod AI, asc Aorta 39 mm   Coronary artery disease 2002   CABG x5   Diabetes mellitus    DVT (deep venous thrombosis) ([facility])    previously on coumadin   Hypercholesteremia    Hyperlipidemia    Mitral valve regurgitation    Skin cancer    tumor removed off of his back      Allergies: Flomax [tamsulosin hcl] and Lasix [furosemide]  Medications: Prior to Admission medications   Medication Sig Start Date End Date Taking? Authorizing Provider  albuterol (VENTOLIN HFA) 108 (90 Base) MCG/ACT inhaler Inhale 2 puffs into the lungs 2 (two) times daily. 11/13/19  Yes [provider]  apixaban (ELIQUIS) 2.5 MG TABS tablet Take 2.5 mg by mouth 2 (two) times daily.   Yes [provider]  fenofibrate (TRICOR) 145 MG tablet Take 1 tablet (145 mg total) by mouth daily. 03/21/20  Yes  Nahser, Wonda Cheng, MD  finasteride (PROSCAR) 5 MG tablet Take 5 mg by mouth daily.   Yes [provider]  insulin NPH-regular Human (NOVOLIN 70/30) (70-30) 100 UNIT/ML injection Inject 10-25 Units into the skin See admin instructions. 25 units in the morning, 10 units at dinner Sliding scale   Yes [provider]  isosorbide mononitrate (IMDUR) 30 MG 24 hr tablet TAKE 1 TABLET BY MOUTH EVERY DAY Patient taking differently: Take 30 mg by mouth daily. 06/27/20  Yes Nahser, Wonda Cheng, MD  megestrol (MEGACE) 20 MG tablet Take 20 mg by mouth daily. 08/01/20  Yes [provider]  metoprolol succinate (TOPROL-XL) 25 MG 24 hr tablet Take 1 tablet (25 mg total) by mouth daily. 05/13/20  Yes Geradine Girt, DO  Multiple Vitamins-Minerals (CENTRUM SILVER PO) Take 1 tablet by mouth daily.   Yes [provider]  Blood Glucose Monitoring Suppl (ONETOUCH VERIO) w/Device KIT  09/06/17   [provider]  feeding supplement (ENSURE ENLIVE / ENSURE PLUS) LIQD Take 237 mLs by mouth 2 (two) times daily between meals. Patient not taking: Reported on 10/08/2020 05/12/20   Geradine Girt, DO  Lancets [facility] ULTRASOFT) lancets  09/06/17   [provider]  mirtazapine (REMERON) 7.5 MG tablet Take 1 tablet (7.5 mg total) by mouth at bedtime. Patient not taking: Reported on 10/08/2020 05/12/20   Geradine Girt, DO  nitroGLYCERIN (NITROSTAT) 0.4 MG SL tablet Place 1 tablet (0.4 mg total)  under the tongue every 5 (five) minutes as needed for chest pain. 04/04/18   Richardson Dopp T, PA-C  omeprazole (PRILOSEC) 20 MG capsule Take 20 mg by mouth 2 (two) times daily.  01/06/11   [provider]  ondansetron (ZOFRAN ODT) 4 MG disintegrating tablet Take 1 tablet (4 mg total) by mouth every 8 (eight) hours as needed for up to 15 doses for nausea or vomiting. Patient not taking: Reported on 10/08/2020 07/30/20   Wyvonnia Dusky, MD  [facility] VERIO test strip  09/06/17   [provider]  [place] 1ML/31G 31G X 5/16" 1 ML MISC USE 1 TWICE DAILY AS DIRECTED 09/29/17   [provider]  Sodium Chloride Flush (NORMAL SALINE FLUSH) 0.9 % SOLN Flush once daily with 10 mls as directed 06/13/20   Corrie Mckusick, DO     Vital Signs: BP (!) 151/79   Pulse 90   Temp 98.9 F (37.2 C) (Oral)   Resp (!) 21   Ht 5' 11"  (1.803 m)   Wt 165 lb (74.8 kg)   SpO2 100%   BMI 23.01 kg/m   Physical Exam awake/alert; chest- CTA bilat; heart- RRR; abd- soft,+BS, GB drain intact draining gold colored bile, bilat pretibial edema noted  Imaging: No results found.  Labs:  CBC: Recent Labs    05/09/20 0123 05/11/20 0320 05/12/20 0046 07/30/20 1941  WBC 15.1* 15.3* 13.6* 12.4*  HGB 13.8 13.5 13.5 14.3  HCT 41.5 39.4 40.4 43.4  PLT 233 273 310 243    COAGS: Recent Labs    05/02/20 2028 05/03/20 1325 05/04/20 0137  APTT 67* 59* 59*    BMP: Recent Labs    05/09/20 0123 05/11/20 0320 05/12/20 0046 06/25/20 1337 07/30/20 1941  NA 132* 134* 134* 136 136  K 4.7 4.6 4.8 4.8 4.4  CL 101 103 105 103 105  CO2 21* 21* 22 17* 19*  GLUCOSE 248* 185* 172* 283* 154*  BUN 50* 57* 53* 39* 60*  CALCIUM 9.4 9.4 9.1 9.1 10.0  CREATININE 2.96* 2.88* 2.83* 2.40* 3.50*  GFRNONAA 20* 21* 21*  --  17*    LIVER FUNCTION TESTS: Recent Labs    05/07/20 0043 05/08/20 0336 06/12/20 1114 07/30/20 1941  BILITOT 2.0* 1.8* 1.0 1.0  AST 68* 34 72* 51*  ALT 140* 93* 44 43  ALKPHOS 63 61 48 47  PROT 6.9 6.7 7.4 7.6  ALBUMIN 2.7* 2.5* 3.4* 3.7    Assessment and Plan: Pt known to IR service from GB drain placement in 2016 followed by removal in 2017, new GB drain placement on 05/02/20 and latest exchange on 10/21/20. He has a hx of calculous cholecystitis, GERD, CHF, aortic insufficiency, CAD with prior CABG, LE DVT, DM, HLD, and MVR. He underwent consultation with Dr. Dwaine Gale on 10/24/20 to discuss image guided perc gallstone retrieval with Spyglass visualization  and was deemed an appropriate candidate for the procedure. He is a poor surgical candidate due to cardiac comorbidities. He presents today for the procedure. Details/risks of procedure, incl but not limited to internal bleeding, infection, injury to adjacent structures d/w pt with his understanding and consent.    Electronically Signed: D. Rowe Robert, PA-C 11/20/2020, 9:36 AM   I spent a total of 25 minutes at the the patient's bedside AND on the patient's hospital floor or unit, greater than 50% of which was counseling/coordinating care for image guided percutaneous gallstone retrieval with Spyglass visualization/gall bladder drain exchange

## 2020-11-21 ENCOUNTER — Other Ambulatory Visit (HOSPITAL_COMMUNITY): Payer: Self-pay | Admitting: Interventional Radiology

## 2020-11-21 DIAGNOSIS — K810 Acute cholecystitis: Secondary | ICD-10-CM

## 2020-11-22 ENCOUNTER — Encounter (HOSPITAL_COMMUNITY): Payer: Self-pay

## 2020-11-25 DIAGNOSIS — E119 Type 2 diabetes mellitus without complications: Secondary | ICD-10-CM | POA: Diagnosis not present

## 2020-11-25 DIAGNOSIS — H35033 Hypertensive retinopathy, bilateral: Secondary | ICD-10-CM | POA: Diagnosis not present

## 2020-11-25 DIAGNOSIS — R55 Syncope and collapse: Secondary | ICD-10-CM | POA: Diagnosis not present

## 2020-11-25 DIAGNOSIS — H43392 Other vitreous opacities, left eye: Secondary | ICD-10-CM | POA: Diagnosis not present

## 2020-11-25 DIAGNOSIS — E7800 Pure hypercholesterolemia, unspecified: Secondary | ICD-10-CM | POA: Diagnosis not present

## 2020-11-25 DIAGNOSIS — H5203 Hypermetropia, bilateral: Secondary | ICD-10-CM | POA: Diagnosis not present

## 2020-11-25 DIAGNOSIS — H01003 Unspecified blepharitis right eye, unspecified eyelid: Secondary | ICD-10-CM | POA: Diagnosis not present

## 2020-11-25 DIAGNOSIS — H52223 Regular astigmatism, bilateral: Secondary | ICD-10-CM | POA: Diagnosis not present

## 2020-11-25 DIAGNOSIS — H81399 Other peripheral vertigo, unspecified ear: Secondary | ICD-10-CM | POA: Diagnosis not present

## 2020-11-25 DIAGNOSIS — I10 Essential (primary) hypertension: Secondary | ICD-10-CM | POA: Diagnosis not present

## 2020-11-25 DIAGNOSIS — H43812 Vitreous degeneration, left eye: Secondary | ICD-10-CM | POA: Diagnosis not present

## 2020-11-25 DIAGNOSIS — H524 Presbyopia: Secondary | ICD-10-CM | POA: Diagnosis not present

## 2020-11-27 ENCOUNTER — Telehealth: Payer: Self-pay | Admitting: Cardiovascular Disease

## 2020-11-27 NOTE — Telephone Encounter (Signed)
Pt was advised by Nahser to call to make a 2 week f/u appt following pt's recent surgery. Please advise pt further with a f/u  appt

## 2020-11-27 NOTE — Telephone Encounter (Signed)
Called pt in regards to 2 week f/u appointment.  I informed him that Dr. Acie Fredrickson is not in the office and I will route message to him to review and advise where to schedule him.  He denies CP no urgent needs at this time.  Will route to Dr. Acie Fredrickson.

## 2020-12-01 NOTE — Telephone Encounter (Signed)
Dr. Acie Fredrickson and the patient okay with January appointment. Will call if he has problems in the meantime.

## 2020-12-01 NOTE — Telephone Encounter (Signed)
Patient states everything is okay and he is feeling about the same as far as his heart goes. He was calling in because he thought Dr. Acie [name] wanted to see him after his gall stone lithotripsy. Noted his 6 month follow up is due ~12/26/20.

## 2020-12-02 ENCOUNTER — Other Ambulatory Visit (HOSPITAL_COMMUNITY): Payer: Self-pay | Admitting: Interventional Radiology

## 2020-12-02 ENCOUNTER — Ambulatory Visit
Admission: RE | Admit: 2020-12-02 | Discharge: 2020-12-02 | Disposition: A | Discharge status: Home / Self Care | Payer: PPO | Source: Ambulatory Visit | Attending: Interventional Radiology | Admitting: Interventional Radiology

## 2020-12-02 ENCOUNTER — Other Ambulatory Visit: Payer: Self-pay | Admitting: Interventional Radiology

## 2020-12-02 DIAGNOSIS — K810 Acute cholecystitis: Secondary | ICD-10-CM

## 2020-12-02 DIAGNOSIS — K8063 Calculus of gallbladder and bile duct with acute cholecystitis with obstruction: Secondary | ICD-10-CM | POA: Diagnosis not present

## 2020-12-02 HISTORY — PX: IR RADIOLOGIST EVAL & MGMT: IMG5224

## 2020-12-02 NOTE — Progress Notes (Signed)
Chief Complaint: Cholelithiasis and Cholecystitis  History of Present Illness: Nathan [name] is a 87 y.o. male With extensive cardiac history, had a cholecystostomy drain placed on 05/02/2020 for treatment of acute cholecystitis.  His symptoms have resolved since that time, and he denies any current right upper quadrant pain, nausea, or vomiting.  His appetite has remained poor with decreased oral intake.    He is not a surgical candidate due to his cardiac comorbidities.  He underwent Spyglass lithotripsy and stone extraction on 11/20/2020.  He is doing well today and does not have any right upper quadrant pain, fever, or chills.    Past Medical History:  Diagnosis Date   Acid reflux    Aortic insufficiency    Cholecystitis    Combined systolic and diastolic heart failure ([place])    Echo 04/2018: inf-lat HK, EF 40-45, severe basal septal hypertrophy, mild conc LVH, Gr 1 DD, severe LAE, mild to mod TR, mod AI, asc Aorta 39 mm   Coronary artery disease 2002   CABG x5   Diabetes mellitus    DVT (deep venous thrombosis) ([place])    previously on coumadin   Hypercholesteremia    Hyperlipidemia    Mitral valve regurgitation    Skin cancer    tumor removed off of his back    Past Surgical History:  Procedure Laterality Date   BILIARY DRAINAGE CATHETER PLACEMENT W/ BILE DUCT TUBE CHANGE  01/2015   CORONARY ARTERY BYPASS GRAFT  12/21/2000   x5   IR CATHETER TUBE CHANGE  06/18/2020   IR EXCHANGE BILIARY DRAIN  06/18/2020   IR EXCHANGE BILIARY DRAIN  08/13/2020   IR EXCHANGE BILIARY DRAIN  10/21/2020   IR EXCHANGE BILIARY DRAIN  11/20/2020   IR PERC CHOLECYSTOSTOMY  05/02/2020   IR RADIOLOGIST EVAL & MGMT  10/24/2020   IR REMOVAL OF CALCULI/DEBRIS BILIARY DUCT/GB  11/20/2020   IR US GUIDANCE  05/02/2020   LEFT HEART CATH AND CORS/GRAFTS ANGIOGRAPHY N/A 04/06/2018   Procedure: LEFT HEART CATH AND CORS/GRAFTS ANGIOGRAPHY;  Surgeon: Lorretta Harp, MD;  Location: [facility];  Service:  Cardiovascular;  Laterality: N/A;   SKIN CANCER EXCISION     TUMOR REMOVAL     off of back, skin cancer    Allergies: Flomax [tamsulosin hcl] and Lasix [furosemide]  Medications: Prior to Admission medications   Medication Sig Start Date End Date Taking? Authorizing Provider  albuterol (VENTOLIN HFA) 108 (90 Base) MCG/ACT inhaler Inhale 2 puffs into the lungs 2 (two) times daily. 11/13/19   [provider]  apixaban (ELIQUIS) 2.5 MG TABS tablet Take 2.5 mg by mouth 2 (two) times daily.    [provider]  Blood Glucose Monitoring Suppl (ONETOUCH VERIO) w/Device KIT  09/06/17   [provider]  feeding supplement (ENSURE ENLIVE / ENSURE PLUS) LIQD Take 237 mLs by mouth 2 (two) times daily between meals. Patient not taking: Reported on 10/08/2020 05/12/20   Geradine Girt, DO  fenofibrate (TRICOR) 145 MG tablet Take 1 tablet (145 mg total) by mouth daily. 03/21/20   Nahser, Wonda Cheng, MD  finasteride (PROSCAR) 5 MG tablet Take 5 mg by mouth daily.    [provider]  insulin NPH-regular Human (NOVOLIN 70/30) (70-30) 100 UNIT/ML injection Inject 10-25 Units into the skin See admin instructions. 25 units in the morning, 10 units at dinner Sliding scale    [provider]  isosorbide mononitrate (IMDUR) 30 MG 24 hr tablet TAKE 1  TABLET BY MOUTH EVERY DAY Patient taking differently: Take 30 mg by mouth daily. 06/27/20   Nahser, Wonda Cheng, MD  Lancets Glory Rosebush ULTRASOFT) lancets  09/06/17   [provider]  megestrol (MEGACE) 20 MG tablet Take 20 mg by mouth daily. 08/01/20   [provider]  metoprolol succinate (TOPROL-XL) 25 MG 24 hr tablet Take 1 tablet (25 mg total) by mouth daily. 05/13/20   Geradine Girt, DO  mirtazapine (REMERON) 7.5 MG tablet Take 1 tablet (7.5 mg total) by mouth at bedtime. Patient not taking: Reported on 10/08/2020 05/12/20   Geradine Girt, DO  Multiple Vitamins-Minerals (CENTRUM SILVER PO) Take 1 tablet by mouth  daily.    [provider]  nitroGLYCERIN (NITROSTAT) 0.4 MG SL tablet Place 1 tablet (0.4 mg total) under the tongue every 5 (five) minutes as needed for chest pain. 04/04/18   Richardson Dopp T, PA-C  omeprazole (PRILOSEC) 20 MG capsule Take 20 mg by mouth 2 (two) times daily.  01/06/11   [provider]  ondansetron (ZOFRAN ODT) 4 MG disintegrating tablet Take 1 tablet (4 mg total) by mouth every 8 (eight) hours as needed for up to 15 doses for nausea or vomiting. Patient not taking: Reported on 10/08/2020 07/30/20   Wyvonnia Dusky, MD  [facility] VERIO test strip  09/06/17   [provider]  [place] 1ML/31G 31G X 5/16" 1 ML MISC USE 1 TWICE DAILY AS DIRECTED 09/29/17   [provider]  Sodium Chloride Flush (NORMAL SALINE FLUSH) 0.9 % SOLN Flush once daily with 10 mls as directed 06/13/20   Corrie Mckusick, DO     Family History  Problem Relation Age of Onset   Heart attack Father    Hypertension Mother    Diabetes Brother    Cancer Sister        breast   Cancer Brother        throat & mouth    Social History   Socioeconomic History   Marital status: Divorced    Spouse name: Not on file   Number of children: Not on file   Years of education: Not on file   Highest education level: Not on file  Occupational History   Not on file  Tobacco Use   Smoking status: Never   Smokeless tobacco: Never  Vaping Use   Vaping Use: Never used  Substance and Sexual Activity   Alcohol use: No   Drug use: No   Sexual activity: Not Currently  Other Topics Concern   Not on file  Social History Narrative   Not on file   Social Determinants of Health   Financial Resource Strain: Not on file  Food Insecurity: Not on file  Transportation Needs: Not on file  Physical Activity: Not on file  Stress: Not on file  Social Connections: Not on file   Review of Systems  Review of Systems: A 12 point ROS discussed and pertinent positives are indicated  in the HPI above.  All other systems are negative.  Physical Exam No direct physical exam was performed    Vital Signs: There were no vitals taken for this visit.  Imaging: IR EXCHANGE BILIARY DRAIN  Result Date: 11/24/2020 INDICATION: 87 year old gentleman with cholelithiasis and choledocholithiasis status post cholecystostomy drain placement for treatment of acute cholecystitis on 05/02/2020 presents to IR for Spyglass scope assisted lithotripsy and stone extraction. Patient is not a surgical candidate for cholecystectomy. EXAM: 1. Cholangiogram and Spyglass procedure. 2.  Balloon plasty of sphincter of Oddi. 3. Choleliths and choledocholith extraction. 4. Cholecystostomy drain exchange and upsize. MEDICATIONS: Rocephin 2 g IV; The antibiotic was administered within an appropriate time frame prior to the initiation of the procedure. ANESTHESIA/SEDATION: Moderate (conscious) sedation was employed during this procedure. A total of Versed 4 mg and Fentanyl 125 mcg was administered intravenously. Moderate Sedation Time: 1 hour in 22 minutes. The patient's level of consciousness and vital signs were monitored continuously by radiology nursing throughout the procedure under my direct supervision. FLUOROSCOPY TIME:  Fluoroscopy Time: 20 minutes 48 seconds (90 mGy). COMPLICATIONS: None immediate. PROCEDURE: Informed written consent was obtained from the patient after a thorough discussion of the procedural risks, benefits and alternatives. All questions were addressed. Maximal Sterile Barrier Technique was utilized including caps, mask, sterile gowns, sterile gloves, sterile drape, hand hygiene and skin antiseptic. A timeout was performed prior to the initiation of the procedure. Patient positioned supine on the procedure table. The external segment of the existing cholecystostomy drain and surrounding skin prepped and draped in usual fashion. Cholangiogram performed through the existing drain under fluoroscopy  again showed multiple small gallbladder stones and 1 large single stone in the distal common bile duct. The drain was exchanged for a 12 French sheath over 0.035 inch guidewire. Kumpe the catheter and Glidewire advanced across the cystic duct into the CBD and eventually into the distal duodenum. Intraluminal positioning of the Kumpe the catheter was confirmed by administering contrast under fluoroscopy. The distal common bile duct stone and sphincter was plasty with 8 mm balloon. The mm balloon was used to migrate the stone fragments from the common bile duct to the duodenum. Post plasty cholangiogram show significant reduction of stones fragments in the common bile duct. The sheath was advanced over the guidewire into the common bile duct. Spyglass scope was inserted which showed some small remaining stone fragments in the common bile duct. Some of the larger fragments was captured with the retrieval basket and removed. Sheath was retracted to the gallbladder. Multiple small calculi were seen within the gallbladder lumen. They were captured with the wire basket removed. Cholangiogram demonstrated significant reduction in amount of choledocholith and choleliths. The 12 French sheath was removed and 14 [place] multipurpose pigtail drain was inserted in the gallbladder lumen. Final position confirmed by administering contrast under fluoroscopy. Drain secured to skin with suture and connected to bag. IMPRESSION: Successful fragmentation and extraction of choledocholith and choleliths utilizing Spyglass scope and wire basket. Electronically Signed   By: Miachel Roux M.D.   On: 11/24/2020 10:24   IR REMOVAL OF CALCULI/DEBRIS BILIARY DUCT/GB  Result Date: 11/21/2020 INDICATION: 87 year old gentleman with cholelithiasis and choledocholithiasis status post cholecystostomy drain placement for treatment of acute cholecystitis on 05/02/2020 presents to IR for Spyglass scope assisted lithotripsy and stone extraction. Patient  is not a surgical candidate for cholecystectomy. EXAM: 1. Cholangiogram and Spyglass procedure. 2. Balloon plasty of sphincter of Oddi. 3. Choleliths and choledocholith extraction. 4. Cholecystostomy drain exchange and upsize. MEDICATIONS: Rocephin 2 g IV; The antibiotic was administered within an appropriate time frame prior to the initiation of the procedure. ANESTHESIA/SEDATION: Moderate (conscious) sedation was employed during this procedure. A total of Versed 4 mg and Fentanyl 125 mcg was administered intravenously. Moderate Sedation Time: 1 hour in 22 minutes. The patient's level of consciousness and vital signs were monitored continuously by radiology nursing throughout the procedure under my direct supervision. FLUOROSCOPY TIME:  Fluoroscopy Time: 20 minutes 48 seconds (90 mGy). COMPLICATIONS: None immediate.  PROCEDURE: Informed written consent was obtained from the patient after a thorough discussion of the procedural risks, benefits and alternatives. All questions were addressed. Maximal Sterile Barrier Technique was utilized including caps, mask, sterile gowns, sterile gloves, sterile drape, hand hygiene and skin antiseptic. A timeout was performed prior to the initiation of the procedure. Patient positioned supine on the procedure table. The external segment of the existing cholecystostomy drain and surrounding skin prepped and draped in usual fashion. Cholangiogram performed through the existing drain under fluoroscopy again showed multiple small gallbladder stones and 1 large single stone in the distal common bile duct. The drain was exchanged for a 12 French sheath over 0.035 inch guidewire. Kumpe the catheter and Glidewire advanced across the cystic duct into the CBD and eventually into the distal duodenum. Intraluminal positioning of the Kumpe the catheter was confirmed by administering contrast under fluoroscopy. The distal common bile duct stone and sphincter was plasty with 8 mm balloon. The mm  balloon was used to migrate the stone fragments from the common bile duct to the duodenum. Post plasty cholangiogram show significant reduction of stones fragments in the common bile duct. The sheath was advanced over the guidewire into the common bile duct. Spyglass scope was inserted which showed some small remaining stone fragments in the common bile duct. Some of the larger fragments was captured with the retrieval basket and removed. Sheath was retracted to the gallbladder. Multiple small calculi were seen within the gallbladder lumen. They were captured with the wire basket removed. Cholangiogram demonstrated significant reduction in amount of choledocholith and choleliths. The 12 French sheath was removed and 14 [place] multipurpose pigtail drain was inserted in the gallbladder lumen. Final position confirmed by administering contrast under fluoroscopy. Drain secured to skin with suture and connected to bag. IMPRESSION: Successful fragmentation and extraction of choledocholith and choleliths utilizing Spyglass scope and wire basket. Electronically Signed   By: Miachel Roux M.D.   On: 11/21/2020 12:21    Labs:  CBC: Recent Labs    05/11/20 0320 05/12/20 0046 07/30/20 1941 11/20/20 1006  WBC 15.3* 13.6* 12.4* 8.9  HGB 13.5 13.5 14.3 11.7*  HCT 39.4 40.4 43.4 36.4*  PLT 273 310 243 228    COAGS: Recent Labs    05/02/20 2028 05/03/20 1325 05/04/20 0137 11/20/20 1006  INR  --   --   --  1.3*  APTT 67* 59* 59*  --     BMP: Recent Labs    05/11/20 0320 05/12/20 0046 06/25/20 1337 07/30/20 1941 11/20/20 1006  NA 134* 134* 136 136 138  K 4.6 4.8 4.8 4.4 4.2  CL 103 105 103 105 110  CO2 21* 22 17* 19* 20*  GLUCOSE 185* 172* 283* 154* 178*  BUN 57* 53* 39* 60* 29*  CALCIUM 9.4 9.1 9.1 10.0 9.2  CREATININE 2.88* 2.83* 2.40* 3.50* 2.04*  GFRNONAA 21* 21*  --  17* 31*    LIVER FUNCTION TESTS: Recent Labs    05/08/20 0336 06/12/20 1114 07/30/20 1941 11/20/20 1006  BILITOT  1.8* 1.0 1.0 1.0  AST 34 72* 51* 35  ALT 93* 44 43 25  ALKPHOS 61 48 47 52  PROT 6.7 7.4 7.6 6.1*  ALBUMIN 2.5* 3.4* 3.7 2.7*    TUMOR MARKERS: No results for input(s): AFPTM, CEA, CA199, CHROMGRNA in the last 8760 hours.  Assessment and Plan:  87 year old gentleman with history of calculus cholecystitis, treated by placement of cholecystostomy drain on 05/02/2020.  Patient has had cholecystostomy drain  since that time.  He is a poor surgical candidate due to is cardiac comorbidities.   I performed lithotripsy and gall stone extraction with SpyGlass on 11/20/2020.  He is doing well today and has no complaints.  We will bring him in for a cholangiogram next week.  If the cystic duct is patent, we will initiate a capping trial.   If he tolerates the capping trial for 7-10 days, we will remove the drain.  Thank you for this interesting consult.  I greatly enjoyed meeting [facility] and look forward to participating in their care.  A copy of this report was sent to the requesting provider on this date.  Electronically Signed: Paula Libra Dulcinea Kinser 12/02/2020, 3:55 PM   I spent a total of    10 Minutes in remote  clinical consultation, greater than 50% of which was counseling/coordinating care for cholelithiasis.    Visit type: Audio only (telephone). Audio (no video) only due to patient's lack of internet/smartphone capability.  Alternative for in-person consultation at [facility], [address] [place], [place], [place]. This visit type was conducted due to national recommendations for restrictions regarding the COVID-19 Pandemic (e.g. social distancing).  This format is felt to be most appropriate for this patient at this time.  All issues noted in this document were discussed and addressed.

## 2020-12-03 ENCOUNTER — Encounter: Payer: Self-pay | Admitting: *Deleted

## 2020-12-08 ENCOUNTER — Ambulatory Visit (HOSPITAL_COMMUNITY)
Admission: RE | Admit: 2020-12-08 | Discharge: 2020-12-08 | Disposition: A | Discharge status: Home / Self Care | Payer: PPO | Source: Ambulatory Visit | Attending: Interventional Radiology | Admitting: Interventional Radiology

## 2020-12-08 ENCOUNTER — Other Ambulatory Visit: Payer: Self-pay

## 2020-12-08 DIAGNOSIS — K810 Acute cholecystitis: Secondary | ICD-10-CM | POA: Diagnosis not present

## 2020-12-08 DIAGNOSIS — Z434 Encounter for attention to other artificial openings of digestive tract: Secondary | ICD-10-CM | POA: Diagnosis not present

## 2020-12-08 HISTORY — PX: IR CHOLANGIOGRAM EXISTING TUBE: IMG6040

## 2020-12-08 MED ORDER — IOHEXOL 300 MG/ML  SOLN
100.0000 mL | Freq: Once | INTRAMUSCULAR | Status: AC | PRN
  Administered 2020-12-08: 30 mL

## 2020-12-09 ENCOUNTER — Other Ambulatory Visit (HOSPITAL_COMMUNITY): Payer: Self-pay | Admitting: Interventional Radiology

## 2020-12-09 DIAGNOSIS — K810 Acute cholecystitis: Secondary | ICD-10-CM

## 2020-12-15 DIAGNOSIS — K8050 Calculus of bile duct without cholangitis or cholecystitis without obstruction: Secondary | ICD-10-CM | POA: Diagnosis not present

## 2020-12-15 DIAGNOSIS — N401 Enlarged prostate with lower urinary tract symptoms: Secondary | ICD-10-CM | POA: Diagnosis not present

## 2020-12-15 DIAGNOSIS — I5042 Chronic combined systolic (congestive) and diastolic (congestive) heart failure: Secondary | ICD-10-CM | POA: Diagnosis not present

## 2020-12-15 DIAGNOSIS — R42 Dizziness and giddiness: Secondary | ICD-10-CM | POA: Diagnosis not present

## 2020-12-15 DIAGNOSIS — I129 Hypertensive chronic kidney disease with stage 1 through stage 4 chronic kidney disease, or unspecified chronic kidney disease: Secondary | ICD-10-CM | POA: Diagnosis not present

## 2020-12-15 DIAGNOSIS — E1121 Type 2 diabetes mellitus with diabetic nephropathy: Secondary | ICD-10-CM | POA: Diagnosis not present

## 2020-12-15 DIAGNOSIS — N184 Chronic kidney disease, stage 4 (severe): Secondary | ICD-10-CM | POA: Diagnosis not present

## 2020-12-15 DIAGNOSIS — I25810 Atherosclerosis of coronary artery bypass graft(s) without angina pectoris: Secondary | ICD-10-CM | POA: Diagnosis not present

## 2020-12-15 DIAGNOSIS — I351 Nonrheumatic aortic (valve) insufficiency: Secondary | ICD-10-CM | POA: Diagnosis not present

## 2020-12-15 DIAGNOSIS — R627 Adult failure to thrive: Secondary | ICD-10-CM | POA: Diagnosis not present

## 2020-12-16 ENCOUNTER — Other Ambulatory Visit: Payer: Self-pay

## 2020-12-16 ENCOUNTER — Ambulatory Visit (HOSPITAL_COMMUNITY)
Admission: RE | Admit: 2020-12-16 | Discharge: 2020-12-16 | Disposition: A | Discharge status: Home / Self Care | Payer: PPO | Source: Ambulatory Visit | Attending: Interventional Radiology | Admitting: Interventional Radiology

## 2020-12-16 DIAGNOSIS — K810 Acute cholecystitis: Secondary | ICD-10-CM | POA: Insufficient documentation

## 2020-12-16 DIAGNOSIS — Z434 Encounter for attention to other artificial openings of digestive tract: Secondary | ICD-10-CM | POA: Diagnosis not present

## 2020-12-16 DIAGNOSIS — K8020 Calculus of gallbladder without cholecystitis without obstruction: Secondary | ICD-10-CM | POA: Diagnosis not present

## 2020-12-16 HISTORY — PX: IR CHOLANGIOGRAM EXISTING TUBE: IMG6040

## 2020-12-16 MED ORDER — IOHEXOL 300 MG/ML  SOLN
50.0000 mL | Freq: Once | INTRAMUSCULAR | Status: AC | PRN
  Administered 2020-12-16: 10 mL

## 2020-12-19 ENCOUNTER — Other Ambulatory Visit: Payer: Self-pay

## 2020-12-19 ENCOUNTER — Ambulatory Visit: Payer: PPO | Admitting: Podiatry

## 2020-12-19 ENCOUNTER — Encounter: Payer: Self-pay | Admitting: Podiatry

## 2020-12-19 DIAGNOSIS — B351 Tinea unguium: Secondary | ICD-10-CM | POA: Diagnosis not present

## 2020-12-19 DIAGNOSIS — N184 Chronic kidney disease, stage 4 (severe): Secondary | ICD-10-CM

## 2020-12-19 DIAGNOSIS — M79675 Pain in left toe(s): Secondary | ICD-10-CM

## 2020-12-19 DIAGNOSIS — E118 Type 2 diabetes mellitus with unspecified complications: Secondary | ICD-10-CM | POA: Diagnosis not present

## 2020-12-19 DIAGNOSIS — M79674 Pain in right toe(s): Secondary | ICD-10-CM | POA: Diagnosis not present

## 2020-12-19 DIAGNOSIS — I872 Venous insufficiency (chronic) (peripheral): Secondary | ICD-10-CM

## 2020-12-19 DIAGNOSIS — D689 Coagulation defect, unspecified: Secondary | ICD-10-CM

## 2020-12-19 NOTE — Progress Notes (Signed)
This patient returns to my office for at risk foot care.  This patient requires this care by a professional since this patient will be at risk due to having diabetes type 2, CKD and chronic insufficiensy.  Patient is taking eliquiss.  This patient is unable to cut nails himself since the patient cannot reach his nails.These nails are painful walking and wearing shoes.  This patient presents for at risk foot care today.  General Appearance  Alert, conversant and in no acute stress.  Vascular  Dorsalis pedis and posterior tibial  pulses are palpable  bilaterally.  Capillary return is within normal limits  bilaterally. Temperature is within normal limits  bilaterally.  Neurologic  Senn-Weinstein monofilament wire test diminished   bilaterally. Muscle power within normal limits bilaterally.  Nails Thick disfigured discolored nails with subungual debris  from hallux to fifth toes bilaterally. No evidence of bacterial infection or drainage bilaterally.  Orthopedic  No limitations of motion  feet .  No crepitus or effusions noted.  Hammer toes second  B/L.  MCJ DJD  B/L. HAV  B/L.  Skin  normotropic skin with no porokeratosis noted bilaterally.  No signs of infections or ulcers noted.     Onychomycosis  Pain in right toes  Pain in left toes  Consent was obtained for treatment procedures.   Mechanical debridement of nails 1-5  bilaterally performed with a nail nipper.  Filed with dremel without incident.    Return office visit  3 months                   Told patient to return for periodic foot care and evaluation due to potential at risk complications.   Gardiner Barefoot DPM

## 2020-12-30 ENCOUNTER — Other Ambulatory Visit: Payer: Self-pay

## 2020-12-30 ENCOUNTER — Encounter: Payer: Self-pay | Admitting: Cardiovascular Disease

## 2020-12-30 ENCOUNTER — Telehealth: Payer: Self-pay | Admitting: Cardiovascular Disease

## 2020-12-30 ENCOUNTER — Ambulatory Visit: Payer: PPO | Admitting: Cardiovascular Disease

## 2020-12-30 ENCOUNTER — Ambulatory Visit (INDEPENDENT_AMBULATORY_CARE_PROVIDER_SITE_OTHER): Payer: PPO

## 2020-12-30 VITALS — BP 128/72 | HR 78 | Ht 71.0 in | Wt 161.6 lb

## 2020-12-30 DIAGNOSIS — R55 Syncope and collapse: Secondary | ICD-10-CM

## 2020-12-30 MED ORDER — METOPROLOL TARTRATE 25 MG PO TABS: 25.0000 mg | ORAL_TABLET | Freq: Two times a day (BID) | ORAL | 3 refills | Status: DC

## 2020-12-30 NOTE — Progress Notes (Signed)
Nathan [name] Date of Birth  1933-12-19       [place]. [address], [place]    [place], [place]  [address]     [phone]       Fax  [phone]       Problem List: 1. Coronary artery disease-status post CABG 2. Aortic insufficiency 3. Hypercholesterolemia 4. Diabetes mellitus 5. Mitral regurgitation     Nathan [name] is doing very well. He's not had any episodes of chest pain or shortness of breath. 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  Dec. 2, 2014:  Nathan [name] is doing well.  Not as much energy.   No CP or dyspnea.    Dec. 3,2015:  Doing well from a cardiac standpoint.  started taking insulin recently.   Worked again for [name].   Dec. 2, 2016:  Doing well from a cardiac standpoint.   BP is high today , was normal yesterday   Feb. 14, 2017:   Nathan [name] is seen back for a visit BP is a bit elevated today .   Eats lots of salty foods ( soup for dinner) He had acute cholecystitis and had a perc drain placed.  The drain has been removed.  He never had his GB removed.  Was placed on home O2 during the hospitalization  And has been on home O2 since He does not think he needs it.   He occasionally will forget to turn it on and cannot tell the difference.  Will check his O2 sate on RA. ( time off oxygen  1440)   During his hospitalization, he had an echocardiogram that reveals normal left ventricle systolic function. He did have mild pulmonary hypertension. He had a stress Myoview study which revealed normal left ventricular systolic function and revealed no ischemia.  Aug. 17, 2017:  Doing well. Had the gall bladder percutaneous drain removed. Did not have GB removed.  Stands on his feet all day , has some leg edema   Feb. 13, 2018:  Doing well .   No further gall bladder issues  Sept.  26, 2018:  Nathan [name] is seen today for follow-up of his coronary artery  disease. Also  has a history of essential hypertension.  May 31, 2017 Doing well . Strawberries  Are ready - hes still working  No CP  No further gall bladder issues  August 29, 2017:  Nathan [name] is seen today  Has been having lots of orthostasis  Lightheadness. Has not been working - works at [name] - in [employer] building  Has been having lots of GI issues ( belching and GERD after eating ) , thinks he is having gall bladder issues.  Appetite has not been good  Labs from Dr. Tomie [name] office show a creatinine of 2.3  Had 4 of his pills stopped last week  Taking fish oil, amlodipine, ASA and potassium ( by mistake, was supposed to stop)  , MVI  Still eating salty meats   Oct. 21, 2019:  Nathan [name] is seen today  Has not had a good appetite for the past several months  Has a left leg DVT - Is now on Eliquis  orthostasis has improved    September 22, 2018:  Doing well.   No CP , no dyspnea.   BP is a little elevated.   March 26, 2019:  Nathan [name] is seen today for follow-up of his coronary artery disease and aortic insufficiency.  He also has a history of hyperlipidemia. Has retired from [employer].  No CP, Has some dyspnea if he works too quickly .   Feb. 4, 2022: Nathan [name] is seen today for follow up of his CAD, AI, HLD Has had some dizziness , especially when standing  He has occasional episodes of chest heaviness./ dyspnea He may have worseining CAD but we are limited in our ability to do a cath because of his CKD - stage 4.   May 13, 2020: Nathan [name] is seen today for follow up of his CAD, AI,HLD He was in the hospital recently for gram-negative sepsis with acute hypoxic respiratory failure He had + troponins likely due to demand ischemia Peak troponin was 5012  Has CKD  Baseline creatinine is between 2-3.  Just got out of hospital yesterday  WBC is still elevated.   Echocardiogram from May 02, 2020 shows mildly reduced left ventricular systolic function with an  ejection fraction of 40 to 45%.  We were unable to determine diastolic function.  There is mildly elevated pulmonary artery pressures. Mitral valve is degenerative with mild to moderate mitral stenosis. Mild AI  He has severe tricuspid regurgitation.  Was found to have gallstones.  Has a drainage tube in gall bladder   He was told that he was not an operative candidate due to excessive risk.  BP is low today.  He is on Toprol-XL 25 mg a day, isosorbide 30 mg a day, hydralazine 10 mg 3 times a day.  Will DC hydranlazine   Jun 25, 2020: Nathan [name] is seen for follow up of hospitalization for gram negative bacteremia, NSTEMI,  No further CP ,  Breathing is ok .  Still has a poor appetitie    His O2 sat has remained > 90 %.     Nov. 15, 2022 Is having some dizziness Has some dizziness when he pulls his compression hose up  ? Orthostasis Will DC Imdur and see if helps  He stopped the Tamulosin and furosemide last week after he fell .   His dizziness did not improve much   We discussed electrolyte replacement in his water.  Nuun Liquid IV  Protein shakes.   Echo from March, 2022 shows EF 40-45%.  Mild - mod Pulmonary HTN - estimated PA pressure of 40     Current Outpatient Medications on File Prior to Visit  Medication Sig Dispense Refill   albuterol (VENTOLIN HFA) 108 (90 Base) MCG/ACT inhaler Inhale 2 puffs into the lungs 2 (two) times daily.     apixaban (ELIQUIS) 2.5 MG TABS tablet Take 2.5 mg by mouth 2 (two) times daily.     Blood Glucose Monitoring Suppl (ONETOUCH VERIO) w/Device KIT      feeding supplement (ENSURE ENLIVE / ENSURE PLUS) LIQD Take 237 mLs by mouth 2 (two) times daily between meals. 237 mL 12   fenofibrate (TRICOR) 145 MG tablet Take 1 tablet (145 mg total) by mouth daily. 90 tablet 3   finasteride (PROSCAR) 5 MG tablet Take 5 mg by mouth daily.     insulin NPH-regular Human (NOVOLIN 70/30) (70-30) 100 UNIT/ML injection Inject 10-25 Units into the skin See  admin instructions. 25 units in the morning, 10 units at dinner Sliding scale     isosorbide mononitrate (IMDUR) 30 MG 24 hr tablet TAKE 1 TABLET BY MOUTH EVERY DAY 90 tablet 3   Lancets (ONETOUCH ULTRASOFT)  lancets      megestrol (MEGACE) 20 MG tablet Take 20 mg by mouth daily.     Multiple Vitamins-Minerals (CENTRUM SILVER PO) Take 1 tablet by mouth daily.     nitroGLYCERIN (NITROSTAT) 0.4 MG SL tablet Place 1 tablet (0.4 mg total) under the tongue every 5 (five) minutes as needed for chest pain. 25 tablet 5   omeprazole (PRILOSEC) 20 MG capsule Take 20 mg by mouth 2 (two) times daily.      ondansetron (ZOFRAN ODT) 4 MG disintegrating tablet Take 1 tablet (4 mg total) by mouth every 8 (eight) hours as needed for up to 15 doses for nausea or vomiting. 15 tablet 0   ONETOUCH VERIO test strip      RELION INSULIN SYRINGE 1ML/31G 31G X 5/16" 1 ML MISC USE 1 TWICE DAILY AS DIRECTED  6   Sodium Chloride Flush (NORMAL SALINE FLUSH) 0.9 % SOLN Flush once daily with 10 mls as directed 450 mL 1   metoprolol succinate (TOPROL-XL) 25 MG 24 hr tablet Take 1 tablet (25 mg total) by mouth daily. (Patient not taking: Reported on 12/30/2020) 30 tablet 0   mirtazapine (REMERON) 7.5 MG tablet Take 1 tablet (7.5 mg total) by mouth at bedtime. (Patient not taking: Reported on 12/30/2020) 30 tablet 0   No current facility-administered medications on file prior to visit.    Allergies  Allergen Reactions   Flomax [Tamsulosin Hcl] Other (See Comments)    Dizzy   Lasix [Furosemide]     Dizziness    Past Medical History:  Diagnosis Date   Acid reflux    Aortic insufficiency    Cholecystitis    Combined systolic and diastolic heart failure (HCC)    Echo 04/2018: inf-lat HK, EF 40-45, severe basal septal hypertrophy, mild conc LVH, Gr 1 DD, severe LAE, mild to mod TR, mod AI, asc Aorta 39 mm   Coronary artery disease 2002   CABG x5   Diabetes mellitus    DVT (deep venous thrombosis) ([facility])    previously on  coumadin   Hypercholesteremia    Hyperlipidemia    Mitral valve regurgitation    Skin cancer    tumor removed off of his back    Past Surgical History:  Procedure Laterality Date   BILIARY DRAINAGE CATHETER PLACEMENT W/ BILE DUCT TUBE CHANGE  01/2015   CORONARY ARTERY BYPASS GRAFT  12/21/2000   x5   IR CATHETER TUBE CHANGE  06/18/2020   IR CHOLANGIOGRAM EXISTING TUBE  12/08/2020   IR CHOLANGIOGRAM EXISTING TUBE  12/16/2020   IR EXCHANGE BILIARY DRAIN  06/18/2020   IR EXCHANGE BILIARY DRAIN  08/13/2020   IR EXCHANGE BILIARY DRAIN  10/21/2020   IR EXCHANGE BILIARY DRAIN  11/20/2020   IR PERC CHOLECYSTOSTOMY  05/02/2020   IR RADIOLOGIST EVAL & MGMT  10/24/2020   IR RADIOLOGIST EVAL & MGMT  12/02/2020   IR REMOVAL OF CALCULI/DEBRIS BILIARY DUCT/GB  11/20/2020   IR US GUIDANCE  05/02/2020   LEFT HEART CATH AND CORS/GRAFTS ANGIOGRAPHY N/A 04/06/2018   Procedure: LEFT HEART CATH AND CORS/GRAFTS ANGIOGRAPHY;  Surgeon: Lorretta Harp, MD;  Location: [facility];  Service: Cardiovascular;  Laterality: N/A;   SKIN CANCER EXCISION     TUMOR REMOVAL     off of back, skin cancer    Social History   Tobacco Use  Smoking Status Never  Smokeless Tobacco Never    Social History   Substance and Sexual Activity  Alcohol Use  No    Family History  Problem Relation Age of Onset   Heart attack Father    Hypertension Mother    Diabetes Brother    Cancer Sister        breast   Cancer Brother        throat & mouth    Reviw of Systems:  Noted in current history, otherwise review of systems is negative.   Physical Exam: Blood pressure 128/72, pulse 78, height $RemoveBe'5\' 11"'[id number]$  (1.803 m), weight 161 lb 9.6 oz (73.3 kg), SpO2 98 %.  GEN:  Well nourished, well developed in no acute distress HEENT: Normal NECK: No JVD; No carotid bruits LYMPHATICS: No lymphadenopathy CARDIAC: RRR  RESPIRATORY:  Clear to auscultation without rales, wheezing or rhonchi  ABDOMEN: Soft, non-tender,  non-distended MUSCULOSKELETAL:  No edema; No deformity  SKIN: Warm and dry NEUROLOGIC:  Alert and oriented x 3     ECG:        Assessment / Plan:   1. Coronary artery disease-     no angina    2.  Chronic combined systolic / diastolic CHF:      Symptoms are stable   3.  Left leg DVT :  -   4.  Syncope:   ? May be orthostasis. Will DC Imdur  He has already stopped his furosemide and tamulosin Advised electrolyte replacement in his water  Nuun or liquid IV   We placed a 14-day monitor on him.  Today at 2:05 PM he had an episode of rapid atrial flutter at 160 beats a minute.  It is possible that this was an SVT.  He is already on Eliquis 2.5 twice a day.  I have sent in a prescription for metoprolol 25 mg twice a day.  We will continue to monitor him.  He was riding in his truck today at 205 and so he does not know if he would have been dizzy if he was up walking around.  We will continue to monitor.  Will see him in 6 months    [name], MD  12/30/2020 11:29 AM    [facility],  [place], [place]  [address] Pager [phone] Phone: [phone]; Fax: [phone]

## 2020-12-30 NOTE — Patient Instructions (Signed)
Medication Instructions:  Your physician has recommended you make the following change in your medication: 1-STOP Isoserbide Mononitrate (Imdur)  *If you need a refill on your cardiac medications before your next appointment, please call your pharmacy*  Lab Work: If you have labs (blood work) drawn today and your tests are completely normal, you will receive your results only by: [facility] (if you have MyChart) OR A paper copy in the mail If you have any lab test that is abnormal or we need to change your treatment, we will call you to review the results.  Testing/Procedures: Your physician has recommended that you wear an event monitor. Event monitors are medical devices that record the heart's electrical activity. Doctors most often [place] these monitors to diagnose arrhythmias. Arrhythmias are problems with the speed or rhythm of the heartbeat. The monitor is a small, portable device. You can wear one while you do your normal daily activities. This is usually used to diagnose what is causing palpitations/syncope (passing out).  Follow-Up: At [facility], you and your health needs are our priority.  As part of our continuing mission to provide you with exceptional heart care, we have created designated Provider Care Teams.  These Care Teams include your primary Cardiologist (physician) and Advanced Practice Providers (APPs -  Physician Assistants and Nurse Practitioners) who all work together to provide you with the care you need, when you need it.  We recommend signing up for the patient portal called "MyChart".  Sign up information is provided on this After Visit Summary.  MyChart is used to connect with patients for Virtual Visits (Telemedicine).  Patients are able to view lab/test results, encounter notes, upcoming appointments, etc.  Non-urgent messages can be sent to your provider as well.   To learn more about what you can do with MyChart, go to [email or web address].    Your  next appointment:   6 month(s)  The format for your next appointment:   In Person  Provider:   [name], MD

## 2020-12-30 NOTE — Telephone Encounter (Signed)
   [employer] calling for serious EKG report

## 2020-12-30 NOTE — Telephone Encounter (Signed)
Katy with monitor support is looking into this. Never received fax and she can not pull report in system. She placed monitor on patient today.

## 2020-12-30 NOTE — Telephone Encounter (Signed)
Merleen Nicely calling with [employer]. SVT auto trigger at 160. Sending fax now

## 2020-12-31 ENCOUNTER — Telehealth: Payer: Self-pay | Admitting: Cardiovascular Disease

## 2020-12-31 ENCOUNTER — Other Ambulatory Visit: Payer: Self-pay

## 2020-12-31 NOTE — Telephone Encounter (Signed)
Dr. Acie Fredrickson has prescribed Lopressor 25 mg po bid for SVT on his monitor from 12/30/20.

## 2020-12-31 NOTE — Telephone Encounter (Signed)
Daughter of the patient called to clarify the medication change that Dr. Acie Fredrickson made for the patient yesterday. She was preoccupied when Dr. Acie Fredrickson called and wants to make sure she has his instructions correct.   Please call back

## 2020-12-31 NOTE — Telephone Encounter (Signed)
Dr. Acie Fredrickson had been notified of the SVT on the pts monitor and has started him on Lopressor 25 mg po bid. Will file strip in the chart.

## 2020-12-31 NOTE — Progress Notes (Signed)
Per Dr Acie Fredrickson d/c Metoprolol Succinate 25mg  - 1 tablet by mouth daily.  Pt is aware.

## 2021-01-06 DIAGNOSIS — N1832 Chronic kidney disease, stage 3b: Secondary | ICD-10-CM | POA: Diagnosis not present

## 2021-01-12 DIAGNOSIS — I5042 Chronic combined systolic (congestive) and diastolic (congestive) heart failure: Secondary | ICD-10-CM | POA: Diagnosis not present

## 2021-01-12 DIAGNOSIS — E1122 Type 2 diabetes mellitus with diabetic chronic kidney disease: Secondary | ICD-10-CM | POA: Diagnosis not present

## 2021-01-12 DIAGNOSIS — N184 Chronic kidney disease, stage 4 (severe): Secondary | ICD-10-CM | POA: Diagnosis not present

## 2021-01-12 DIAGNOSIS — R809 Proteinuria, unspecified: Secondary | ICD-10-CM | POA: Diagnosis not present

## 2021-01-12 DIAGNOSIS — R55 Syncope and collapse: Secondary | ICD-10-CM | POA: Diagnosis not present

## 2021-01-12 DIAGNOSIS — I129 Hypertensive chronic kidney disease with stage 1 through stage 4 chronic kidney disease, or unspecified chronic kidney disease: Secondary | ICD-10-CM | POA: Diagnosis not present

## 2021-01-12 DIAGNOSIS — N400 Enlarged prostate without lower urinary tract symptoms: Secondary | ICD-10-CM | POA: Diagnosis not present

## 2021-01-15 DIAGNOSIS — Z794 Long term (current) use of insulin: Secondary | ICD-10-CM | POA: Diagnosis not present

## 2021-01-15 DIAGNOSIS — E119 Type 2 diabetes mellitus without complications: Secondary | ICD-10-CM | POA: Diagnosis not present

## 2021-01-27 DIAGNOSIS — D225 Melanocytic nevi of trunk: Secondary | ICD-10-CM | POA: Diagnosis not present

## 2021-01-27 DIAGNOSIS — D485 Neoplasm of uncertain behavior of skin: Secondary | ICD-10-CM | POA: Diagnosis not present

## 2021-01-27 DIAGNOSIS — L570 Actinic keratosis: Secondary | ICD-10-CM | POA: Diagnosis not present

## 2021-01-27 DIAGNOSIS — C44319 Basal cell carcinoma of skin of other parts of face: Secondary | ICD-10-CM | POA: Diagnosis not present

## 2021-01-27 DIAGNOSIS — C4441 Basal cell carcinoma of skin of scalp and neck: Secondary | ICD-10-CM | POA: Diagnosis not present

## 2021-02-23 ENCOUNTER — Ambulatory Visit: Payer: PPO | Admitting: Cardiovascular Disease

## 2021-02-23 DIAGNOSIS — N401 Enlarged prostate with lower urinary tract symptoms: Secondary | ICD-10-CM | POA: Diagnosis not present

## 2021-02-23 DIAGNOSIS — R3914 Feeling of incomplete bladder emptying: Secondary | ICD-10-CM | POA: Diagnosis not present

## 2021-02-23 DIAGNOSIS — R3912 Poor urinary stream: Secondary | ICD-10-CM | POA: Diagnosis not present

## 2021-02-27 DIAGNOSIS — Z794 Long term (current) use of insulin: Secondary | ICD-10-CM | POA: Diagnosis not present

## 2021-02-27 DIAGNOSIS — D692 Other nonthrombocytopenic purpura: Secondary | ICD-10-CM | POA: Diagnosis not present

## 2021-02-27 DIAGNOSIS — I4819 Other persistent atrial fibrillation: Secondary | ICD-10-CM | POA: Diagnosis not present

## 2021-02-27 DIAGNOSIS — N184 Chronic kidney disease, stage 4 (severe): Secondary | ICD-10-CM | POA: Diagnosis not present

## 2021-02-27 DIAGNOSIS — E1151 Type 2 diabetes mellitus with diabetic peripheral angiopathy without gangrene: Secondary | ICD-10-CM | POA: Diagnosis not present

## 2021-02-27 DIAGNOSIS — Z7901 Long term (current) use of anticoagulants: Secondary | ICD-10-CM | POA: Diagnosis not present

## 2021-02-27 DIAGNOSIS — E1122 Type 2 diabetes mellitus with diabetic chronic kidney disease: Secondary | ICD-10-CM | POA: Diagnosis not present

## 2021-03-12 ENCOUNTER — Emergency Department (HOSPITAL_BASED_OUTPATIENT_CLINIC_OR_DEPARTMENT_OTHER)
Admission: EM | Admit: 2021-03-12 | Discharge: 2021-03-12 | Disposition: A | Discharge status: Home / Self Care | Payer: PPO | Attending: Emergency Medicine | Admitting: Emergency Medicine

## 2021-03-12 ENCOUNTER — Other Ambulatory Visit: Payer: Self-pay

## 2021-03-12 ENCOUNTER — Encounter (HOSPITAL_BASED_OUTPATIENT_CLINIC_OR_DEPARTMENT_OTHER): Payer: Self-pay | Admitting: Emergency Medicine

## 2021-03-12 ENCOUNTER — Emergency Department (HOSPITAL_BASED_OUTPATIENT_CLINIC_OR_DEPARTMENT_OTHER): Payer: PPO

## 2021-03-12 DIAGNOSIS — R112 Nausea with vomiting, unspecified: Secondary | ICD-10-CM | POA: Diagnosis not present

## 2021-03-12 DIAGNOSIS — I10 Essential (primary) hypertension: Secondary | ICD-10-CM | POA: Diagnosis not present

## 2021-03-12 DIAGNOSIS — Z794 Long term (current) use of insulin: Secondary | ICD-10-CM | POA: Diagnosis not present

## 2021-03-12 DIAGNOSIS — E119 Type 2 diabetes mellitus without complications: Secondary | ICD-10-CM | POA: Diagnosis not present

## 2021-03-12 DIAGNOSIS — N400 Enlarged prostate without lower urinary tract symptoms: Secondary | ICD-10-CM | POA: Diagnosis not present

## 2021-03-12 DIAGNOSIS — N3289 Other specified disorders of bladder: Secondary | ICD-10-CM | POA: Diagnosis not present

## 2021-03-12 DIAGNOSIS — I110 Hypertensive heart disease with heart failure: Secondary | ICD-10-CM | POA: Insufficient documentation

## 2021-03-12 DIAGNOSIS — Z79899 Other long term (current) drug therapy: Secondary | ICD-10-CM | POA: Diagnosis not present

## 2021-03-12 DIAGNOSIS — R339 Retention of urine, unspecified: Secondary | ICD-10-CM | POA: Insufficient documentation

## 2021-03-12 DIAGNOSIS — Z7901 Long term (current) use of anticoagulants: Secondary | ICD-10-CM | POA: Insufficient documentation

## 2021-03-12 DIAGNOSIS — M5459 Other low back pain: Secondary | ICD-10-CM | POA: Diagnosis not present

## 2021-03-12 DIAGNOSIS — K5730 Diverticulosis of large intestine without perforation or abscess without bleeding: Secondary | ICD-10-CM | POA: Diagnosis not present

## 2021-03-12 DIAGNOSIS — K5900 Constipation, unspecified: Secondary | ICD-10-CM | POA: Insufficient documentation

## 2021-03-12 DIAGNOSIS — I5040 Unspecified combined systolic (congestive) and diastolic (congestive) heart failure: Secondary | ICD-10-CM | POA: Insufficient documentation

## 2021-03-12 DIAGNOSIS — N1330 Unspecified hydronephrosis: Secondary | ICD-10-CM | POA: Diagnosis not present

## 2021-03-12 DIAGNOSIS — M545 Low back pain, unspecified: Secondary | ICD-10-CM | POA: Insufficient documentation

## 2021-03-12 LAB — URINALYSIS, ROUTINE W REFLEX MICROSCOPIC
Bilirubin Urine: NEGATIVE
Glucose, UA: 250 mg/dL — AB
Ketones, ur: NEGATIVE mg/dL
Nitrite: NEGATIVE
Protein, ur: 100 mg/dL — AB
Specific Gravity, Urine: 1.02 (ref 1.005–1.030)
pH: 7 (ref 5.0–8.0)

## 2021-03-12 LAB — CBC WITH DIFFERENTIAL/PLATELET
Abs Immature Granulocytes: 0.03 10*3/uL (ref 0.00–0.07)
Basophils Absolute: 0 10*3/uL (ref 0.0–0.1)
Basophils Relative: 0 %
Eosinophils Absolute: 0 10*3/uL (ref 0.0–0.5)
Eosinophils Relative: 0 %
HCT: 41.1 % (ref 39.0–52.0)
Hemoglobin: 13.9 g/dL (ref 13.0–17.0)
Immature Granulocytes: 0 %
Lymphocytes Relative: 22 %
Lymphs Abs: 2.3 10*3/uL (ref 0.7–4.0)
MCH: 30.6 pg (ref 26.0–34.0)
MCHC: 33.8 g/dL (ref 30.0–36.0)
MCV: 90.5 fL (ref 80.0–100.0)
Monocytes Absolute: 0.5 10*3/uL (ref 0.1–1.0)
Monocytes Relative: 5 %
Neutro Abs: 7.5 10*3/uL (ref 1.7–7.7)
Neutrophils Relative %: 73 %
Platelets: 305 10*3/uL (ref 150–400)
RBC: 4.54 MIL/uL (ref 4.22–5.81)
RDW: 13.4 % (ref 11.5–15.5)
WBC: 10.3 10*3/uL (ref 4.0–10.5)
nRBC: 0 % (ref 0.0–0.2)

## 2021-03-12 LAB — COMPREHENSIVE METABOLIC PANEL
ALT: 22 U/L (ref 0–44)
AST: 35 U/L (ref 15–41)
Albumin: 3.4 g/dL — ABNORMAL LOW (ref 3.5–5.0)
Alkaline Phosphatase: 42 U/L (ref 38–126)
Anion gap: 12 (ref 5–15)
BUN: 27 mg/dL — ABNORMAL HIGH (ref 8–23)
CO2: 20 mmol/L — ABNORMAL LOW (ref 22–32)
Calcium: 9.3 mg/dL (ref 8.9–10.3)
Chloride: 103 mmol/L (ref 98–111)
Creatinine, Ser: 2.37 mg/dL — ABNORMAL HIGH (ref 0.61–1.24)
GFR, Estimated: 26 mL/min — ABNORMAL LOW (ref >60)
Glucose, Bld: 268 mg/dL — ABNORMAL HIGH (ref 70–99)
Potassium: 4.2 mmol/L (ref 3.5–5.1)
Sodium: 135 mmol/L (ref 135–145)
Total Bilirubin: 1.3 mg/dL — ABNORMAL HIGH (ref 0.3–1.2)
Total Protein: 7.3 g/dL (ref 6.5–8.1)

## 2021-03-12 LAB — LIPASE, BLOOD: Lipase: 30 U/L (ref 11–51)

## 2021-03-12 LAB — URINALYSIS, MICROSCOPIC (REFLEX): WBC, UA: >50 WBC/hpf (ref 0–5)

## 2021-03-12 MED ORDER — HYDROCODONE-ACETAMINOPHEN 5-325 MG PO TABS
1.0000 | ORAL_TABLET | Freq: Once | ORAL | Status: DC
  Filled 2021-03-12: qty 1

## 2021-03-12 MED ORDER — MORPHINE SULFATE (PF) 4 MG/ML IV SOLN
4.0000 mg | Freq: Once | INTRAVENOUS | Status: AC
  Administered 2021-03-12: 4 mg via INTRAVENOUS
  Filled 2021-03-12: qty 1

## 2021-03-12 MED ORDER — ONDANSETRON HCL 4 MG/2ML IJ SOLN
4.0000 mg | Freq: Once | INTRAMUSCULAR | Status: AC
  Administered 2021-03-12: 4 mg via INTRAVENOUS
  Filled 2021-03-12: qty 2

## 2021-03-12 NOTE — ED Provider Notes (Signed)
[facility] EMERGENCY DEPARTMENT Provider Note   CSN: [id number] Arrival date & time: 03/12/21  3159     History  Chief Complaint  Patient presents with   Constipation   Vomiting    Nathan [name] is a 88 y.o. male.  Pt is a 88 yo male with pmh of htn, hyperlipidemia, dm, chf systolic and diastolic, and hx of renal dysfunction presenting for constipation. Pt admits to no bowel movement x weeks. Admits to new lower back bilaterally x 1 days with associated nausea and vomiting yesterday. Denies fevers or chills. Denies sob or chest pain.   The history is provided by the patient. No language interpreter was used.  Constipation Associated symptoms: nausea and vomiting   Associated symptoms: no abdominal pain, no back pain, no dysuria and no fever       Home Medications Prior to Admission medications   Medication Sig Start Date End Date Taking? Authorizing Provider  albuterol (VENTOLIN HFA) 108 (90 Base) MCG/ACT inhaler Inhale 2 puffs into the lungs 2 (two) times daily. 11/13/19   [provider]  apixaban (ELIQUIS) 2.5 MG TABS tablet Take 2.5 mg by mouth 2 (two) times daily.    [provider]  Blood Glucose Monitoring Suppl (ONETOUCH VERIO) w/Device KIT  09/06/17   [provider]  feeding supplement (ENSURE ENLIVE / ENSURE PLUS) LIQD Take 237 mLs by mouth 2 (two) times daily between meals. 05/12/20   Geradine Girt, DO  fenofibrate (TRICOR) 145 MG tablet Take 1 tablet (145 mg total) by mouth daily. 03/21/20   Nahser, Wonda Cheng, MD  finasteride (PROSCAR) 5 MG tablet Take 5 mg by mouth daily.    [provider]  insulin NPH-regular Human (NOVOLIN 70/30) (70-30) 100 UNIT/ML injection Inject 10-25 Units into the skin See admin instructions. 25 units in the morning, 10 units at dinner Sliding scale    [provider]  Lancets (ONETOUCH ULTRASOFT) lancets  09/06/17   [provider]  megestrol (MEGACE) 20 MG tablet Take 20 mg by  mouth daily. 08/01/20   [provider]  metoprolol tartrate (LOPRESSOR) 25 MG tablet Take 1 tablet (25 mg total) by mouth 2 (two) times daily. 12/30/20 03/30/21  Nahser, Wonda Cheng, MD  mirtazapine (REMERON) 7.5 MG tablet Take 1 tablet (7.5 mg total) by mouth at bedtime. Patient not taking: Reported on 12/30/2020 05/12/20   Geradine Girt, DO  Multiple Vitamins-Minerals (CENTRUM SILVER PO) Take 1 tablet by mouth daily.    [provider]  nitroGLYCERIN (NITROSTAT) 0.4 MG SL tablet Place 1 tablet (0.4 mg total) under the tongue every 5 (five) minutes as needed for chest pain. 04/04/18   Richardson Dopp T, PA-C  omeprazole (PRILOSEC) 20 MG capsule Take 20 mg by mouth 2 (two) times daily.  01/06/11   [provider]  ondansetron (ZOFRAN ODT) 4 MG disintegrating tablet Take 1 tablet (4 mg total) by mouth every 8 (eight) hours as needed for up to 15 doses for nausea or vomiting. 07/30/20   Trifan, Carola Rhine, MD  [facility] VERIO test strip  09/06/17   [provider]  Boaz 1ML/31G 31G X 5/16" 1 ML MISC USE 1 TWICE DAILY AS DIRECTED 09/29/17   [provider]  Sodium Chloride Flush (NORMAL SALINE FLUSH) 0.9 % SOLN Flush once daily with 10 mls as directed 06/13/20   Corrie Mckusick, DO      Allergies    Flomax [tamsulosin hcl] and Lasix [furosemide]  Review of Systems   Review of Systems  Constitutional:  Negative for chills and fever.  HENT:  Negative for ear pain and sore throat.   Eyes:  Negative for pain and visual disturbance.  Respiratory:  Negative for cough and shortness of breath.   Cardiovascular:  Negative for chest pain and palpitations.  Gastrointestinal:  Positive for constipation, nausea and vomiting. Negative for abdominal pain.  Genitourinary:  Negative for dysuria and hematuria.  Musculoskeletal:  Negative for arthralgias and back pain.  Skin:  Negative for color change and rash.  Neurological:  Negative for seizures and  syncope.  All other systems reviewed and are negative.  Physical Exam Updated Vital Signs BP (!) 156/82    Pulse 81    Temp 97.7 F (36.5 C) (Oral)    Resp 18    Ht 5' 11"  (1.803 m)    Wt 75.3 kg    SpO2 98%    BMI 23.15 kg/m  Physical Exam Vitals and nursing note reviewed.  Constitutional:      General: He is not in acute distress.    Appearance: He is well-developed.  HENT:     Head: Normocephalic and atraumatic.  Eyes:     Conjunctiva/sclera: Conjunctivae normal.  Cardiovascular:     Rate and Rhythm: Normal rate and regular rhythm.     Heart sounds: No murmur heard. Pulmonary:     Effort: Pulmonary effort is normal. No respiratory distress.     Breath sounds: Normal breath sounds.  Abdominal:     Palpations: Abdomen is soft.     Tenderness: There is no abdominal tenderness.  Musculoskeletal:        General: No swelling.     Cervical back: Neck supple.  Skin:    General: Skin is warm and dry.     Capillary Refill: Capillary refill takes less than 2 seconds.  Neurological:     Mental Status: He is alert.  Psychiatric:        Mood and Affect: Mood normal.    ED Results / Procedures / Treatments   Labs (all labs ordered are listed, but only abnormal results are displayed) Labs Reviewed  URINALYSIS, ROUTINE W REFLEX MICROSCOPIC  COMPREHENSIVE METABOLIC PANEL  CBC WITH DIFFERENTIAL/PLATELET  LIPASE, BLOOD    EKG None  Radiology No results found.  Procedures Procedures    Medications Ordered in ED Medications - No data to display  ED Course/ Medical Decision Making/ A&P                           Medical Decision Making Amount and/or Complexity of Data Reviewed Labs: ordered. Radiology: ordered.  Risk Prescription drug management.   7:36 AM 88 yo male with pmh of htn, hyperlipidemia, dm, chf systolic and diastolic, and hx of renal dysfunction presenting for abdominal pain, nausea, vomting, and constipation.   Stable laboratory studies. Images  reviewed, my evaluation of CT abdomen is no bowel obstruction. Significant urinary retention. Pt encouraged to urinate with post void residual demonstrating > 900cc. Foley cath placed without difficulty. UA demonstrates no UTI.  CT results: 1. Mild left-sided hydroureteronephrosis and left perinephric and periureteric soft tissue stranding. No obstructing stone is identified. The possibility of a recently passed stone, a left mid ureteral stricture, or left-sided upper tract urinary tract infection (i.e., pyelonephritis) Should be considered and further clinical evaluation is recommended. 2. Severe prostatomegaly with moderate distension of the urinary bladder which demonstrates a diffusely  thickened and trabeculated bladder wall, indicating chronic bladder outlet obstruction.  I called patient's urologist Dr. Jeffie Pollock who is able to see him in office on Feb 3rd at 9:30 AM.   Patient in no distress and overall condition improved here in the ED. Detailed discussions were had with the patient regarding current findings, and need for close f/u with PCP or on call doctor. The patient has been instructed to return immediately if the symptoms worsen in any way for re-evaluation. Patient verbalized understanding and is in agreement with current care plan. All questions answered prior to discharge.         Final Clinical Impression(s) / ED Diagnoses Final diagnoses:  Acute bilateral low back pain without sciatica  Urinary retention    Rx / DC Orders ED Discharge Orders     None         Lianne Cure, DO 90/24/09 1127

## 2021-03-12 NOTE — ED Triage Notes (Signed)
Pt c/o left flank pain with nausea and difficulty voiding.

## 2021-03-12 NOTE — ED Notes (Signed)
Pt voided 75 mL, second bladder scan showed >999 mL in bladder.

## 2021-03-12 NOTE — ED Notes (Signed)
Bladder scanned Pt. 759 mL on scan.

## 2021-03-17 DIAGNOSIS — R3914 Feeling of incomplete bladder emptying: Secondary | ICD-10-CM | POA: Diagnosis not present

## 2021-03-17 DIAGNOSIS — R310 Gross hematuria: Secondary | ICD-10-CM | POA: Diagnosis not present

## 2021-03-19 DIAGNOSIS — Z9181 History of falling: Secondary | ICD-10-CM | POA: Diagnosis not present

## 2021-03-19 DIAGNOSIS — Z466 Encounter for fitting and adjustment of urinary device: Secondary | ICD-10-CM | POA: Diagnosis not present

## 2021-03-20 DIAGNOSIS — R338 Other retention of urine: Secondary | ICD-10-CM | POA: Diagnosis not present

## 2021-03-20 DIAGNOSIS — R310 Gross hematuria: Secondary | ICD-10-CM | POA: Diagnosis not present

## 2021-03-21 ENCOUNTER — Other Ambulatory Visit: Payer: Self-pay | Admitting: Cardiovascular Disease

## 2021-03-27 ENCOUNTER — Inpatient Hospital Stay (HOSPITAL_COMMUNITY)
Admission: EM | Admit: 2021-03-27 | Discharge: 2021-03-30 | DRG: 699 | Disposition: A | Discharge status: Home / Self Care | Payer: PPO | Attending: Internal Medicine | Admitting: Internal Medicine

## 2021-03-27 ENCOUNTER — Emergency Department (HOSPITAL_COMMUNITY): Payer: PPO

## 2021-03-27 ENCOUNTER — Ambulatory Visit (INDEPENDENT_AMBULATORY_CARE_PROVIDER_SITE_OTHER): Payer: PPO | Admitting: Podiatry

## 2021-03-27 ENCOUNTER — Encounter (HOSPITAL_COMMUNITY): Payer: Self-pay

## 2021-03-27 ENCOUNTER — Encounter: Payer: Self-pay | Admitting: Podiatry

## 2021-03-27 ENCOUNTER — Other Ambulatory Visit: Payer: Self-pay

## 2021-03-27 VITALS — BP 124/72 | HR 56 | Resp 20

## 2021-03-27 DIAGNOSIS — S41111A Laceration without foreign body of right upper arm, initial encounter: Secondary | ICD-10-CM | POA: Diagnosis present

## 2021-03-27 DIAGNOSIS — W19XXXA Unspecified fall, initial encounter: Secondary | ICD-10-CM | POA: Diagnosis present

## 2021-03-27 DIAGNOSIS — Z833 Family history of diabetes mellitus: Secondary | ICD-10-CM | POA: Diagnosis not present

## 2021-03-27 DIAGNOSIS — Z951 Presence of aortocoronary bypass graft: Secondary | ICD-10-CM | POA: Diagnosis not present

## 2021-03-27 DIAGNOSIS — N136 Pyonephrosis: Secondary | ICD-10-CM | POA: Diagnosis present

## 2021-03-27 DIAGNOSIS — M79674 Pain in right toe(s): Secondary | ICD-10-CM

## 2021-03-27 DIAGNOSIS — Z20822 Contact with and (suspected) exposure to covid-19: Secondary | ICD-10-CM | POA: Diagnosis not present

## 2021-03-27 DIAGNOSIS — I083 Combined rheumatic disorders of mitral, aortic and tricuspid valves: Secondary | ICD-10-CM | POA: Diagnosis not present

## 2021-03-27 DIAGNOSIS — Z66 Do not resuscitate: Secondary | ICD-10-CM | POA: Diagnosis not present

## 2021-03-27 DIAGNOSIS — Z79899 Other long term (current) drug therapy: Secondary | ICD-10-CM

## 2021-03-27 DIAGNOSIS — E860 Dehydration: Secondary | ICD-10-CM | POA: Diagnosis not present

## 2021-03-27 DIAGNOSIS — Y846 Urinary catheterization as the cause of abnormal reaction of the patient, or of later complication, without mention of misadventure at the time of the procedure: Secondary | ICD-10-CM | POA: Diagnosis present

## 2021-03-27 DIAGNOSIS — K219 Gastro-esophageal reflux disease without esophagitis: Secondary | ICD-10-CM | POA: Diagnosis not present

## 2021-03-27 DIAGNOSIS — I4891 Unspecified atrial fibrillation: Secondary | ICD-10-CM | POA: Diagnosis present

## 2021-03-27 DIAGNOSIS — Y92531 Health care provider office as the place of occurrence of the external cause: Secondary | ICD-10-CM | POA: Diagnosis not present

## 2021-03-27 DIAGNOSIS — I454 Nonspecific intraventricular block: Secondary | ICD-10-CM | POA: Diagnosis present

## 2021-03-27 DIAGNOSIS — I10 Essential (primary) hypertension: Secondary | ICD-10-CM | POA: Diagnosis present

## 2021-03-27 DIAGNOSIS — I6782 Cerebral ischemia: Secondary | ICD-10-CM | POA: Diagnosis not present

## 2021-03-27 DIAGNOSIS — Z8249 Family history of ischemic heart disease and other diseases of the circulatory system: Secondary | ICD-10-CM | POA: Diagnosis not present

## 2021-03-27 DIAGNOSIS — I130 Hypertensive heart and chronic kidney disease with heart failure and stage 1 through stage 4 chronic kidney disease, or unspecified chronic kidney disease: Secondary | ICD-10-CM | POA: Diagnosis present

## 2021-03-27 DIAGNOSIS — E118 Type 2 diabetes mellitus with unspecified complications: Secondary | ICD-10-CM | POA: Diagnosis not present

## 2021-03-27 DIAGNOSIS — Z85828 Personal history of other malignant neoplasm of skin: Secondary | ICD-10-CM | POA: Diagnosis not present

## 2021-03-27 DIAGNOSIS — N390 Urinary tract infection, site not specified: Secondary | ICD-10-CM | POA: Diagnosis present

## 2021-03-27 DIAGNOSIS — N401 Enlarged prostate with lower urinary tract symptoms: Secondary | ICD-10-CM | POA: Diagnosis present

## 2021-03-27 DIAGNOSIS — I2510 Atherosclerotic heart disease of native coronary artery without angina pectoris: Secondary | ICD-10-CM | POA: Diagnosis not present

## 2021-03-27 DIAGNOSIS — E1122 Type 2 diabetes mellitus with diabetic chronic kidney disease: Secondary | ICD-10-CM | POA: Diagnosis present

## 2021-03-27 DIAGNOSIS — R55 Syncope and collapse: Secondary | ICD-10-CM | POA: Diagnosis present

## 2021-03-27 DIAGNOSIS — R338 Other retention of urine: Secondary | ICD-10-CM | POA: Diagnosis not present

## 2021-03-27 DIAGNOSIS — B351 Tinea unguium: Secondary | ICD-10-CM | POA: Diagnosis not present

## 2021-03-27 DIAGNOSIS — E7800 Pure hypercholesterolemia, unspecified: Secondary | ICD-10-CM | POA: Diagnosis present

## 2021-03-27 DIAGNOSIS — N184 Chronic kidney disease, stage 4 (severe): Secondary | ICD-10-CM

## 2021-03-27 DIAGNOSIS — I4892 Unspecified atrial flutter: Secondary | ICD-10-CM | POA: Diagnosis not present

## 2021-03-27 DIAGNOSIS — D689 Coagulation defect, unspecified: Secondary | ICD-10-CM

## 2021-03-27 DIAGNOSIS — Z7901 Long term (current) use of anticoagulants: Secondary | ICD-10-CM

## 2021-03-27 DIAGNOSIS — M79675 Pain in left toe(s): Secondary | ICD-10-CM

## 2021-03-27 DIAGNOSIS — Z79818 Long term (current) use of other agents affecting estrogen receptors and estrogen levels: Secondary | ICD-10-CM

## 2021-03-27 DIAGNOSIS — I5042 Chronic combined systolic (congestive) and diastolic (congestive) heart failure: Secondary | ICD-10-CM | POA: Diagnosis not present

## 2021-03-27 DIAGNOSIS — I872 Venous insufficiency (chronic) (peripheral): Secondary | ICD-10-CM

## 2021-03-27 DIAGNOSIS — Z794 Long term (current) use of insulin: Secondary | ICD-10-CM

## 2021-03-27 DIAGNOSIS — B9620 Unspecified Escherichia coli [E. coli] as the cause of diseases classified elsewhere: Secondary | ICD-10-CM | POA: Diagnosis not present

## 2021-03-27 DIAGNOSIS — Z888 Allergy status to other drugs, medicaments and biological substances status: Secondary | ICD-10-CM

## 2021-03-27 DIAGNOSIS — T83511A Infection and inflammatory reaction due to indwelling urethral catheter, initial encounter: Principal | ICD-10-CM | POA: Diagnosis present

## 2021-03-27 DIAGNOSIS — R42 Dizziness and giddiness: Secondary | ICD-10-CM | POA: Diagnosis not present

## 2021-03-27 DIAGNOSIS — G319 Degenerative disease of nervous system, unspecified: Secondary | ICD-10-CM | POA: Diagnosis not present

## 2021-03-27 DIAGNOSIS — S0990XA Unspecified injury of head, initial encounter: Secondary | ICD-10-CM | POA: Diagnosis not present

## 2021-03-27 LAB — CBC WITH DIFFERENTIAL/PLATELET
Abs Immature Granulocytes: 0.05 10*3/uL (ref 0.00–0.07)
Basophils Absolute: 0.1 10*3/uL (ref 0.0–0.1)
Basophils Relative: 1 %
Eosinophils Absolute: 0.2 10*3/uL (ref 0.0–0.5)
Eosinophils Relative: 2 %
HCT: 40 % (ref 39.0–52.0)
Hemoglobin: 13.2 g/dL (ref 13.0–17.0)
Immature Granulocytes: 0 %
Lymphocytes Relative: 35 %
Lymphs Abs: 3.9 10*3/uL (ref 0.7–4.0)
MCH: 31 pg (ref 26.0–34.0)
MCHC: 33 g/dL (ref 30.0–36.0)
MCV: 93.9 fL (ref 80.0–100.0)
Monocytes Absolute: 0.8 10*3/uL (ref 0.1–1.0)
Monocytes Relative: 7 %
Neutro Abs: 6.2 10*3/uL (ref 1.7–7.7)
Neutrophils Relative %: 55 %
Platelets: 316 10*3/uL (ref 150–400)
RBC: 4.26 MIL/uL (ref 4.22–5.81)
RDW: 13.2 % (ref 11.5–15.5)
WBC: 11.2 10*3/uL — ABNORMAL HIGH (ref 4.0–10.5)
nRBC: 0 % (ref 0.0–0.2)

## 2021-03-27 LAB — URINALYSIS, ROUTINE W REFLEX MICROSCOPIC
Bilirubin Urine: NEGATIVE
Glucose, UA: >=500 mg/dL — AB
Ketones, ur: NEGATIVE mg/dL
Nitrite: NEGATIVE
Protein, ur: 100 mg/dL — AB
RBC / HPF: >50 RBC/hpf — ABNORMAL HIGH (ref 0–5)
Specific Gravity, Urine: 1.017 (ref 1.005–1.030)
pH: 5 (ref 5.0–8.0)

## 2021-03-27 LAB — COMPREHENSIVE METABOLIC PANEL
ALT: 22 U/L (ref 0–44)
AST: 35 U/L (ref 15–41)
Albumin: 3.2 g/dL — ABNORMAL LOW (ref 3.5–5.0)
Alkaline Phosphatase: 54 U/L (ref 38–126)
Anion gap: 7 (ref 5–15)
BUN: 37 mg/dL — ABNORMAL HIGH (ref 8–23)
CO2: 22 mmol/L (ref 22–32)
Calcium: 9.5 mg/dL (ref 8.9–10.3)
Chloride: 104 mmol/L (ref 98–111)
Creatinine, Ser: 2.34 mg/dL — ABNORMAL HIGH (ref 0.61–1.24)
GFR, Estimated: 27 mL/min — ABNORMAL LOW (ref >60)
Glucose, Bld: 300 mg/dL — ABNORMAL HIGH (ref 70–99)
Potassium: 4.6 mmol/L (ref 3.5–5.1)
Sodium: 133 mmol/L — ABNORMAL LOW (ref 135–145)
Total Bilirubin: 0.4 mg/dL (ref 0.3–1.2)
Total Protein: 7 g/dL (ref 6.5–8.1)

## 2021-03-27 LAB — MAGNESIUM: Magnesium: 2 mg/dL (ref 1.7–2.4)

## 2021-03-27 LAB — RESP PANEL BY RT-PCR (FLU A&B, COVID) ARPGX2
Influenza A by PCR: NEGATIVE
Influenza B by PCR: NEGATIVE
SARS Coronavirus 2 by RT PCR: NEGATIVE

## 2021-03-27 MED ORDER — SODIUM CHLORIDE 0.9 % IV SOLN
1.0000 g | Freq: Once | INTRAVENOUS | Status: AC
  Administered 2021-03-27: 1 g via INTRAVENOUS
  Filled 2021-03-27: qty 10

## 2021-03-27 MED ORDER — SODIUM CHLORIDE 0.9% FLUSH: 3.0000 mL | INTRAVENOUS | Status: DC | PRN

## 2021-03-27 MED ORDER — METOPROLOL TARTRATE 25 MG PO TABS
25.0000 mg | ORAL_TABLET | Freq: Two times a day (BID) | ORAL | Status: DC
  Administered 2021-03-27 – 2021-03-28 (×2): 25 mg via ORAL
  Filled 2021-03-27 (×2): qty 1

## 2021-03-27 MED ORDER — SODIUM CHLORIDE 0.9% FLUSH
3.0000 mL | Freq: Two times a day (BID) | INTRAVENOUS | Status: DC
  Administered 2021-03-27 – 2021-03-28 (×3): 3 mL via INTRAVENOUS

## 2021-03-27 MED ORDER — SODIUM CHLORIDE 0.9 % IV SOLN
1.0000 g | INTRAVENOUS | Status: DC
  Administered 2021-03-28 – 2021-03-29 (×2): 1 g via INTRAVENOUS
  Filled 2021-03-27 (×3): qty 10

## 2021-03-27 MED ORDER — SODIUM CHLORIDE 0.9 % IV SOLN: 250.0000 mL | INTRAVENOUS | Status: DC | PRN

## 2021-03-27 MED ORDER — APIXABAN 2.5 MG PO TABS
2.5000 mg | ORAL_TABLET | Freq: Two times a day (BID) | ORAL | Status: DC
  Administered 2021-03-27 – 2021-03-30 (×6): 2.5 mg via ORAL
  Filled 2021-03-27 (×6): qty 1

## 2021-03-27 MED ORDER — LACTATED RINGERS IV BOLUS
1000.0000 mL | Freq: Once | INTRAVENOUS | Status: AC
  Administered 2021-03-27: 1000 mL via INTRAVENOUS

## 2021-03-27 MED ORDER — ALBUTEROL SULFATE (2.5 MG/3ML) 0.083% IN NEBU
3.0000 mL | INHALATION_SOLUTION | Freq: Two times a day (BID) | RESPIRATORY_TRACT | Status: DC
  Administered 2021-03-27 – 2021-03-30 (×5): 3 mL via RESPIRATORY_TRACT
  Filled 2021-03-27 (×7): qty 3

## 2021-03-27 NOTE — ED Triage Notes (Signed)
Patient hd a syncopal episode while at a Podiatrist appointment. Patient c/o having dizziness x 10 months and worse in the past few days. Patient states he did not hit his head. Patient started back on Eliquis 3 days ago.   Patient has a skin tear to the right forearm.

## 2021-03-27 NOTE — H&P (Signed)
History and Physical    Arlie Riker ZRA:[id number] DOB: Jun 24, 1933 DOA: 03/27/2021  PCP: Donnajean Lopes, MD  Patient coming from: Home  Chief Complaint: Passing out  HPI: Deklan Minar is a 88 y.o. male with medical history significant of CABG, coronary artery disease, aortic insufficiency, mitral valve regurg comes in with recurrent syncope that is increasing in frequency for over the past year.  Patient reports that he is a patient of Dr. Sheralyn Boatman and was evaluated and told he was not a surgical candidate for valvular repair.  He also had a 2-week Holter monitor placed recently at which point he had no syncopal episodes during the time of the 2-week period.  He denies any chest pain.  He reports that the episodes most frequently happen when he gets up to walk.  2 weeks ago he had some urinary retention went to urgent care had a Foley catheter placed and since that time he has not been wearing his TED hose as frequently and has coincided with the increase in his syncopal episodes.  He denies any fevers.  He denies any nausea vomiting or seizure-like activity.  He denies any shortness of breath.  Patient is being referred for admission for his recurrent syncopal episodes.  2 weeks ago when he was evaluated imaging showed changes consistent with pyelonephritis but was not treated with antibiotics at that time.   Review of Systems: As per HPI otherwise 10 point review of systems negative.   Past Medical History:  Diagnosis Date   Acid reflux    Aortic insufficiency    Cholecystitis    Combined systolic and diastolic heart failure ([place])    Echo 04/2018: inf-lat HK, EF 40-45, severe basal septal hypertrophy, mild conc LVH, Gr 1 DD, severe LAE, mild to mod TR, mod AI, asc Aorta 39 mm   Coronary artery disease 2002   CABG x5   Diabetes mellitus    DVT (deep venous thrombosis) ([place])    previously on coumadin   Hypercholesteremia    Hyperlipidemia    Mitral valve regurgitation    Skin cancer     tumor removed off of his back    Past Surgical History:  Procedure Laterality Date   BILIARY DRAINAGE CATHETER PLACEMENT W/ BILE DUCT TUBE CHANGE  01/2015   CORONARY ARTERY BYPASS GRAFT  12/21/2000   x5   IR CATHETER TUBE CHANGE  06/18/2020   IR CHOLANGIOGRAM EXISTING TUBE  12/08/2020   IR CHOLANGIOGRAM EXISTING TUBE  12/16/2020   IR EXCHANGE BILIARY DRAIN  06/18/2020   IR EXCHANGE BILIARY DRAIN  08/13/2020   IR EXCHANGE BILIARY DRAIN  10/21/2020   IR EXCHANGE BILIARY DRAIN  11/20/2020   IR PERC CHOLECYSTOSTOMY  05/02/2020   IR RADIOLOGIST EVAL & MGMT  10/24/2020   IR RADIOLOGIST EVAL & MGMT  12/02/2020   IR REMOVAL OF CALCULI/DEBRIS BILIARY DUCT/GB  11/20/2020   IR US GUIDANCE  05/02/2020   LEFT HEART CATH AND CORS/GRAFTS ANGIOGRAPHY N/A 04/06/2018   Procedure: LEFT HEART CATH AND CORS/GRAFTS ANGIOGRAPHY;  Surgeon: Lorretta Harp, MD;  Location: [facility];  Service: Cardiovascular;  Laterality: N/A;   SKIN CANCER EXCISION     TUMOR REMOVAL     off of back, skin cancer     reports that he has never smoked. He has never used smokeless tobacco. He reports that he does not drink alcohol and does not use drugs.  Allergies  Allergen Reactions   Flomax [Tamsulosin Hcl] Other (See Comments)  Dizzy   Lasix [Furosemide]     Dizziness    Family History  Problem Relation Age of Onset   Heart attack Father    Hypertension Mother    Diabetes Brother    Cancer Sister        breast   Cancer Brother        throat & mouth    Prior to Admission medications   Medication Sig Start Date End Date Taking? Authorizing Provider  albuterol (VENTOLIN HFA) 108 (90 Base) MCG/ACT inhaler Inhale 2 puffs into the lungs 2 (two) times daily. 11/13/19   [provider]  apixaban (ELIQUIS) 2.5 MG TABS tablet Take 2.5 mg by mouth 2 (two) times daily.    [provider]  Blood Glucose Monitoring Suppl (ONETOUCH VERIO) w/Device KIT  09/06/17   [provider]  feeding  supplement (ENSURE ENLIVE / ENSURE PLUS) LIQD Take 237 mLs by mouth 2 (two) times daily between meals. 05/12/20   Geradine Girt, DO  fenofibrate (TRICOR) 145 MG tablet TAKE 1 TABLET BY MOUTH EVERY DAY 03/23/21   Nahser, Wonda Cheng, MD  finasteride (PROSCAR) 5 MG tablet Take 5 mg by mouth daily.    [provider]  insulin NPH-regular Human (NOVOLIN 70/30) (70-30) 100 UNIT/ML injection Inject 10-25 Units into the skin See admin instructions. 25 units in the morning, 10 units at dinner Sliding scale    [provider]  Lancets (ONETOUCH ULTRASOFT) lancets  09/06/17   [provider]  megestrol (MEGACE) 20 MG tablet Take 20 mg by mouth daily. 08/01/20   [provider]  metoprolol tartrate (LOPRESSOR) 25 MG tablet Take 1 tablet (25 mg total) by mouth 2 (two) times daily. 12/30/20 03/30/21  Nahser, Wonda Cheng, MD  mirtazapine (REMERON) 7.5 MG tablet Take 1 tablet (7.5 mg total) by mouth at bedtime. Patient not taking: Reported on 12/30/2020 05/12/20   Geradine Girt, DO  Multiple Vitamins-Minerals (CENTRUM SILVER PO) Take 1 tablet by mouth daily.    [provider]  nitroGLYCERIN (NITROSTAT) 0.4 MG SL tablet Place 1 tablet (0.4 mg total) under the tongue every 5 (five) minutes as needed for chest pain. 04/04/18   Richardson Dopp T, PA-C  omeprazole (PRILOSEC) 20 MG capsule Take 20 mg by mouth 2 (two) times daily.  01/06/11   [provider]  ondansetron (ZOFRAN ODT) 4 MG disintegrating tablet Take 1 tablet (4 mg total) by mouth every 8 (eight) hours as needed for up to 15 doses for nausea or vomiting. 07/30/20   Trifan, Carola Rhine, MD  [facility] VERIO test strip  09/06/17   [provider]  [place] 1ML/31G 31G X 5/16" 1 ML MISC USE 1 TWICE DAILY AS DIRECTED 09/29/17   [provider]  Sodium Chloride Flush (NORMAL SALINE FLUSH) 0.9 % SOLN Flush once daily with 10 mls as directed 06/13/20   Corrie Mckusick, DO    Physical  Exam: Vitals:   03/27/21 1600 03/27/21 1709 03/27/21 1730 03/27/21 1733  BP: (!) 164/80 (!) 156/71 (!) 159/79   Pulse: 77 75 72 73  Resp: (!) 23 15 (!) 23 17  Temp:      TempSrc:      SpO2: 96% 96% 94% 95%  Weight:      Height:          Constitutional: NAD, calm, comfortable Vitals:   03/27/21 1600 03/27/21 1709 03/27/21 1730 03/27/21 1733  BP: (!) 164/80 (!) 156/71 (!) 159/79  Pulse: 77 75 72 73  Resp: (!) 23 15 (!) 23 17  Temp:      TempSrc:      SpO2: 96% 96% 94% 95%  Weight:      Height:       Eyes: PERRL, lids and conjunctivae normal ENMT: Mucous membranes are moist. Posterior pharynx clear of any exudate or lesions.Normal dentition.  Neck: normal, supple, no masses, no thyromegaly Respiratory: clear to auscultation bilaterally, no wheezing, no crackles. Normal respiratory effort. No accessory muscle use.  Cardiovascular: Regular rate and rhythm, no murmurs / rubs / gallops. No extremity edema. 2+ pedal pulses. No carotid bruits.  Abdomen: no tenderness, no masses palpated. No hepatosplenomegaly. Bowel sounds positive.  Musculoskeletal: no clubbing / cyanosis. No joint deformity upper and lower extremities. Good ROM, no contractures. Normal muscle tone.  Skin: no rashes, lesions, ulcers. No induration Neurologic: CN 2-12 grossly intact. Sensation intact, DTR normal. Strength 5/5 in all 4.  Psychiatric: Normal judgment and insight. Alert and oriented x 3. Normal mood.    Labs on Admission: I have personally reviewed following labs and imaging studies  CBC: Recent Labs  Lab 03/27/21 1253  WBC 11.2*  NEUTROABS 6.2  HGB 13.2  HCT 40.0  MCV 93.9  PLT 960   Basic Metabolic Panel: Recent Labs  Lab 03/27/21 1253  NA 133*  K 4.6  CL 104  CO2 22  GLUCOSE 300*  BUN 37*  CREATININE 2.34*  CALCIUM 9.5  MG 2.0   GFR: Estimated Creatinine Clearance: 24.6 mL/min (A) (by C-G formula based on SCr of 2.34 mg/dL (H)). Liver Function Tests: Recent Labs  Lab  03/27/21 1253  AST 35  ALT 22  ALKPHOS 54  BILITOT 0.4  PROT 7.0  ALBUMIN 3.2*   No results for input(s): LIPASE, AMYLASE in the last 168 hours. No results for input(s): AMMONIA in the last 168 hours. Coagulation Profile: No results for input(s): INR, PROTIME in the last 168 hours. Cardiac Enzymes: No results for input(s): CKTOTAL, CKMB, CKMBINDEX, TROPONINI in the last 168 hours. BNP (last 3 results) No results for input(s): PROBNP in the last 8760 hours. HbA1C: No results for input(s): HGBA1C in the last 72 hours. CBG: No results for input(s): GLUCAP in the last 168 hours. Lipid Profile: No results for input(s): CHOL, HDL, LDLCALC, TRIG, CHOLHDL, LDLDIRECT in the last 72 hours. Thyroid Function Tests: No results for input(s): TSH, T4TOTAL, FREET4, T3FREE, THYROIDAB in the last 72 hours. Anemia Panel: No results for input(s): VITAMINB12, FOLATE, FERRITIN, TIBC, IRON, RETICCTPCT in the last 72 hours. Urine analysis:    Component Value Date/Time   COLORURINE AMBER (A) 03/27/2021 1422   APPEARANCEUR CLOUDY (A) 03/27/2021 1422   LABSPEC 1.017 03/27/2021 1422   PHURINE 5.0 03/27/2021 1422   GLUCOSEU >=500 (A) 03/27/2021 1422   HGBUR LARGE (A) 03/27/2021 1422   BILIRUBINUR NEGATIVE 03/27/2021 1422   KETONESUR NEGATIVE 03/27/2021 1422   PROTEINUR 100 (A) 03/27/2021 1422   NITRITE NEGATIVE 03/27/2021 1422   LEUKOCYTESUR LARGE (A) 03/27/2021 1422   Sepsis Labs: !!!!!!!!!!!!!!!!!!!!!!!!!!!!!!!!!!!!!!!!!!!! _0 (procalcitonin:4,lacticidven:4) )No results found for this or any previous visit (from the past 240 hour(s)).   Radiological Exams on Admission: CT Head Wo Contrast  Result Date: 03/27/2021 CLINICAL DATA:  Head trauma, minor (Age >= 65y) EXAM: CT HEAD WITHOUT CONTRAST TECHNIQUE: Contiguous axial images were obtained from the base of the skull through the vertex without intravenous contrast. RADIATION DOSE REDUCTION: This exam was performed according to the  departmental dose-optimization  program which includes automated exposure control, adjustment of the mA and/or kV according to patient size and/or use of iterative reconstruction technique. COMPARISON:  None. FINDINGS: Brain: No evidence of acute intracranial hemorrhage. Prominent symmetric CSF space overlying the bilateral hemispheres likely related to cerebral atrophy.No evidence of mass or concerning mass effect.The ventricular system is in proportion to degree of cerebral atrophy.Scattered subcortical and periventricular white matter hypodensities, nonspecific but likely sequela of chronic small vessel ischemic disease.Moderate cerebral atrophy Vascular: No hyperdense vessel or unexpected calcification. Skull: Prior right mastoidectomy.  Otherwise negative. Sinuses/Orbits: Minimal ethmoid air cell mucosal thickening. Orbits are unremarkable. Other: None. IMPRESSION: No acute intracranial abnormality. Chronic small vessel ischemic disease and cerebral atrophy. Electronically Signed   By: Maurine Simmering M.D.   On: 03/27/2021 16:39    EKG: Independently reviewed.  A-fib right bundle branch block compared to old unchanged Old chart reviewed Case discussed with EDP Cardiology consult placed via secure chat with Dr. Katharina Caper and [place] PA  Assessment/Plan  88 year old male with persistent recurrent syncope  Principal Problem:    Syncope-sounds like patient has been fully evaluated by his cardiology team as an outpatient and deemed not to be a surgical candidate.  No arrhythmias picked up here yet.  I have consulted cardiology to perhaps repeat a [place] for further monitoring for any arrhythmias.  Do not see the utility of repeating echo as he is known aortic insufficiency and mitral regurg which is nonoperative.  Will defer that decision to cardiology team.  Patient has no focal neurological deficits.  Will check orthostatic vital signs.  Cardiology will see in the morning.  Active Problems:    S/P  CABG (coronary artery bypass graft)-noted stable    Essential hypertension-noted    Chronic combined systolic and diastolic CHF (congestive heart failure) (HCC)-currently compensated at this time    Stage 4 chronic kidney disease (HCC)-stable at baseline     Recent urinary retention-patient was seen 2 weeks ago had a Foley placed at that time urinalysis did look infected with evidence of pyelonephritis.  He does have some lower back pain still.  He was not treated with antibiotics at that time.  Will place on Rocephin.  Follow-up on urine culture.  Patient not septic.    Further recommendations being over hospital course   DVT prophylaxis: On Eliquis Code Status: DNR Family Communication: None Disposition Plan: 1 to 2 days Consults called: Cardiology Admission status: Observation   Iyan Flett A MD [facility]  If 7PM-7AM, please contact night-coverage [email or web address] Password [facility]  03/27/2021, 5:35 PM

## 2021-03-27 NOTE — ED Provider Triage Note (Signed)
Emergency Medicine Provider Triage Evaluation Note  Nathan [name] , a 88 y.o. male  was evaluated in triage.  Pt complains of syncope and right arm skin tear.  Patient has been having dizzy spells for some time.  He has had some medication adjustments which helped, but he has been having difficulty with lightheadedness with standing over the past couple of weeks.  Cardiac history including CABG, heart failure, atrial/mitral/tricuspid regurgitation on echo March 2022.  Patient was at podiatry office this morning.  He had a syncopal episode and cut his left arm.  He then went to urology today.  He has an indwelling Foley catheter.  They did not remove it today.  They recommended that he come to the emergency department.  No head or neck injury.  He is anticoagulated.  Review of Systems  Positive: Dizziness, syncope, skin tear Negative: Nausea, vomiting, diarrhea  Physical Exam  BP 140/69 (BP Location: Left Arm)    Pulse 83    Temp 98.7 F (37.1 C) (Oral)    Resp 16    SpO2 96%  Gen:   Awake, no distress   Resp:  Normal effort  MSK:   Moves extremities without difficulty  Other:  R forearm bandaged, no bleed thru  Medical Decision Making  Medically screening exam initiated at 12:43 PM.  Appropriate orders placed.  Nathan [name] was informed that the remainder of the evaluation will be completed by another provider, this initial triage assessment does not replace that evaluation, and the importance of remaining in the ED until their evaluation is complete.     Nathan Cater, PA-C 03/27/21 1245

## 2021-03-27 NOTE — Progress Notes (Signed)
This patient returns to my office for at risk foot care.  This patient requires this care by a professional since this patient will be at risk due to having diabetes type 2, CKD and chronic insufficiensy.  Patient is taking eliquiss.  This patient is unable to cut nails himself since the patient cannot reach his nails.These nails are painful walking and wearing shoes.  This patient presents for at risk foot care today.  General Appearance  Alert, conversant and in no acute stress.  Vascular  Dorsalis pedis and posterior tibial  pulses are palpable  bilaterally.  Capillary return is within normal limits  bilaterally. Temperature is within normal limits  bilaterally.  Neurologic  Senn-Weinstein monofilament wire test diminished   bilaterally. Muscle power within normal limits bilaterally.  Nails Thick disfigured discolored nails with subungual debris  from hallux to fifth toes bilaterally. No evidence of bacterial infection or drainage bilaterally.  Orthopedic  No limitations of motion  feet .  No crepitus or effusions noted.  Hammer toes second  B/L.  MCJ DJD  B/L. HAV  B/L.  Skin  normotropic skin with no porokeratosis noted bilaterally.  No signs of infections or ulcers noted.  Asymptomatic callus 1st MPJ left foot.   Onychomycosis  Pain in right toes  Pain in left toes  Consent was obtained for treatment procedures.   Mechanical debridement of nails 1-5  bilaterally performed with a nail nipper.  Filed with dremel without incident.    Return office visit  3 months                   Told patient to return for periodic foot care and evaluation due to potential at risk complications.   Gardiner Barefoot DPM

## 2021-03-27 NOTE — ED Provider Notes (Signed)
[facility] DEPT Provider Note  CSN: [id number] Arrival date & time: 03/27/21 1225  Chief Complaint(s) Loss of Consciousness  HPI Nathan [name] is a 88 y.o. male with PMH CAD status post CABG, CHF with EF 40 to 45%, mitral valve regurg, recurrent syncope, urinary retention secondary to BPH and urinary catheter placement who presents emergency department for evaluation of syncope.  Patient was at the podiatry office today when he had a syncopal episode in the lobby suffering a skin tear to his right arm.  He was evaluated by both the podiatrist and the urologist as they share the same building and the patient was sent to the emergency department for evaluation of his syncope and fall on blood thinners.  Patient states that his syncope is preceded by a prodrome, denies chest pain, shortness of breath, abdominal pain, nausea, vomiting or other systemic symptoms.   Loss of Consciousness  Past Medical History Past Medical History:  Diagnosis Date   Acid reflux    Aortic insufficiency    Cholecystitis    Combined systolic and diastolic heart failure ([place])    Echo 04/2018: inf-lat HK, EF 40-45, severe basal septal hypertrophy, mild conc LVH, Gr 1 DD, severe LAE, mild to mod TR, mod AI, asc Aorta 39 mm   Coronary artery disease 2002   CABG x5   Diabetes mellitus    DVT (deep venous thrombosis) ([facility])    previously on coumadin   Hypercholesteremia    Hyperlipidemia    Mitral valve regurgitation    Skin cancer    tumor removed off of his back   Patient Active Problem List   Diagnosis Date Noted   Non-ST elevation (NSTEMI) myocardial infarction ([place]) 05/13/2020   Protein-calorie malnutrition, severe 05/10/2020   Abdominal pain 05/01/2020   Calculus of gallbladder with biliary obstruction but without cholecystitis    Elevated LFTs    Aortic stenosis 03/26/2019   Chronic venous insufficiency 12/12/2018   Pain due to onychomycosis of toenails of both feet  08/11/2018   Coagulation disorder ([place]) 08/11/2018   Chronic combined systolic and diastolic CHF (congestive heart failure) ([place]) 05/11/2018   Stage 4 chronic kidney disease ([place]) 05/11/2018   Essential hypertension 11/11/2016   Pneumonia 01/23/2015   Hypoxia 01/22/2015   Elevated troponin 01/22/2015   Acute cholecystitis 01/21/2015   S/P CABG (coronary artery bypass graft) 01/21/2015   Diabetes mellitus type 2 with complications ([place]) 24/26/8341   Sepsis ([place])    Valvular heart disease 09/29/2011   Syncope 01/21/2011   Coronary artery disease    Aortic insufficiency    Hypercholesteremia    Mitral valve regurgitation    Home Medication(s) Prior to Admission medications   Medication Sig Start Date End Date Taking? Authorizing Provider  albuterol (VENTOLIN HFA) 108 (90 Base) MCG/ACT inhaler Inhale 2 puffs into the lungs 2 (two) times daily. 11/13/19   [provider]  apixaban (ELIQUIS) 2.5 MG TABS tablet Take 2.5 mg by mouth 2 (two) times daily.    [provider]  Blood Glucose Monitoring Suppl (ONETOUCH VERIO) w/Device KIT  09/06/17   [provider]  feeding supplement (ENSURE ENLIVE / ENSURE PLUS) LIQD Take 237 mLs by mouth 2 (two) times daily between meals. 05/12/20   Geradine Girt, DO  fenofibrate (TRICOR) 145 MG tablet TAKE 1 TABLET BY MOUTH EVERY DAY 03/23/21   Nahser, Wonda Cheng, MD  finasteride (PROSCAR) 5 MG tablet Take 5 mg by mouth daily.    [provider]  insulin NPH-regular Human (NOVOLIN 70/30) (70-30) 100 UNIT/ML injection Inject 10-25 Units into the skin See admin instructions. 25 units in the morning, 10 units at dinner Sliding scale    [provider]  Lancets (ONETOUCH ULTRASOFT) lancets  09/06/17   [provider]  megestrol (MEGACE) 20 MG tablet Take 20 mg by mouth daily. 08/01/20   [provider]  metoprolol tartrate (LOPRESSOR) 25 MG tablet Take 1 tablet (25 mg total) by mouth 2 (two) times daily.  12/30/20 03/30/21  Nahser, Wonda Cheng, MD  mirtazapine (REMERON) 7.5 MG tablet Take 1 tablet (7.5 mg total) by mouth at bedtime. Patient not taking: Reported on 12/30/2020 05/12/20   Geradine Girt, DO  Multiple Vitamins-Minerals (CENTRUM SILVER PO) Take 1 tablet by mouth daily.    [provider]  nitroGLYCERIN (NITROSTAT) 0.4 MG SL tablet Place 1 tablet (0.4 mg total) under the tongue every 5 (five) minutes as needed for chest pain. 04/04/18   Richardson Dopp T, PA-C  omeprazole (PRILOSEC) 20 MG capsule Take 20 mg by mouth 2 (two) times daily.  01/06/11   [provider]  ondansetron (ZOFRAN ODT) 4 MG disintegrating tablet Take 1 tablet (4 mg total) by mouth every 8 (eight) hours as needed for up to 15 doses for nausea or vomiting. 07/30/20   Trifan, Carola Rhine, MD  [facility] VERIO test strip  09/06/17   [provider]  [place] 1ML/31G 31G X 5/16" 1 ML MISC USE 1 TWICE DAILY AS DIRECTED 09/29/17   [provider]  Sodium Chloride Flush (NORMAL SALINE FLUSH) 0.9 % SOLN Flush once daily with 10 mls as directed 06/13/20   Corrie Mckusick, DO                                                                                                                                    Past Surgical History Past Surgical History:  Procedure Laterality Date   BILIARY DRAINAGE CATHETER PLACEMENT W/ BILE DUCT TUBE CHANGE  01/2015   CORONARY ARTERY BYPASS GRAFT  12/21/2000   x5   IR CATHETER TUBE CHANGE  06/18/2020   IR CHOLANGIOGRAM EXISTING TUBE  12/08/2020   IR CHOLANGIOGRAM EXISTING TUBE  12/16/2020   IR EXCHANGE BILIARY DRAIN  06/18/2020   IR EXCHANGE BILIARY DRAIN  08/13/2020   IR EXCHANGE BILIARY DRAIN  10/21/2020   IR EXCHANGE BILIARY DRAIN  11/20/2020   IR PERC CHOLECYSTOSTOMY  05/02/2020   IR RADIOLOGIST EVAL & MGMT  10/24/2020   IR RADIOLOGIST EVAL & MGMT  12/02/2020   IR REMOVAL OF CALCULI/DEBRIS BILIARY DUCT/GB  11/20/2020   IR US GUIDANCE  05/02/2020   LEFT HEART CATH  AND CORS/GRAFTS ANGIOGRAPHY N/A 04/06/2018   Procedure: LEFT HEART CATH AND CORS/GRAFTS ANGIOGRAPHY;  Surgeon: Lorretta Harp, MD;  Location: [facility];  Service: Cardiovascular;  Laterality: N/A;   SKIN CANCER EXCISION  TUMOR REMOVAL     off of back, skin cancer   Family History Family History  Problem Relation Age of Onset   Heart attack Father    Hypertension Mother    Diabetes Brother    Cancer Sister        breast   Cancer Brother        throat & mouth    Social History Social History   Tobacco Use   Smoking status: Never   Smokeless tobacco: Never  Vaping Use   Vaping Use: Never used  Substance Use Topics   Alcohol use: No   Drug use: No   Allergies Flomax [tamsulosin hcl] and Lasix [furosemide]  Review of Systems Review of Systems  Cardiovascular:  Positive for syncope.  Neurological:  Positive for syncope.   Physical Exam Vital Signs  I have reviewed the triage vital signs BP (!) 164/80    Pulse 77    Temp 98.7 F (37.1 C) (Oral)    Resp (!) 23    Ht 5' 11"  (1.803 m)    Wt 75.3 kg    SpO2 96%    BMI 23.15 kg/m   Physical Exam Vitals and nursing note reviewed.  Constitutional:      General: He is not in acute distress.    Appearance: He is well-developed.  HENT:     Head: Normocephalic and atraumatic.  Eyes:     Conjunctiva/sclera: Conjunctivae normal.  Cardiovascular:     Rate and Rhythm: Normal rate and regular rhythm.     Heart sounds: Murmur heard.  Pulmonary:     Effort: Pulmonary effort is normal. No respiratory distress.     Breath sounds: Normal breath sounds.  Abdominal:     Palpations: Abdomen is soft.     Tenderness: There is no abdominal tenderness.  Musculoskeletal:        General: No swelling.     Cervical back: Neck supple.  Skin:    General: Skin is warm and dry.     Capillary Refill: Capillary refill takes less than 2 seconds.  Neurological:     Mental Status: He is alert.  Psychiatric:        Mood and  Affect: Mood normal.    ED Results and Treatments Labs (all labs ordered are listed, but only abnormal results are displayed) Labs Reviewed  CBC WITH DIFFERENTIAL/PLATELET - Abnormal; Notable for the following components:      Result Value   WBC 11.2 (*)    All other components within normal limits  COMPREHENSIVE METABOLIC PANEL - Abnormal; Notable for the following components:   Sodium 133 (*)    Glucose, Bld 300 (*)    BUN 37 (*)    Creatinine, Ser 2.34 (*)    Albumin 3.2 (*)    GFR, Estimated 27 (*)    All other components within normal limits  URINALYSIS, ROUTINE W REFLEX MICROSCOPIC - Abnormal; Notable for the following components:   Color, Urine AMBER (*)    APPearance CLOUDY (*)    Glucose, UA >=500 (*)    Hgb urine dipstick LARGE (*)    Protein, ur 100 (*)    Leukocytes,Ua LARGE (*)    RBC / HPF >50 (*)    Bacteria, UA RARE (*)    All other components within normal limits  MAGNESIUM  Radiology CT Head Wo Contrast  Result Date: 03/27/2021 CLINICAL DATA:  Head trauma, minor (Age >= 65y) EXAM: CT HEAD WITHOUT CONTRAST TECHNIQUE: Contiguous axial images were obtained from the base of the skull through the vertex without intravenous contrast. RADIATION DOSE REDUCTION: This exam was performed according to the departmental dose-optimization program which includes automated exposure control, adjustment of the mA and/or kV according to patient size and/or use of iterative reconstruction technique. COMPARISON:  None. FINDINGS: Brain: No evidence of acute intracranial hemorrhage. Prominent symmetric CSF space overlying the bilateral hemispheres likely related to cerebral atrophy.No evidence of mass or concerning mass effect.The ventricular system is in proportion to degree of cerebral atrophy.Scattered subcortical and periventricular white matter hypodensities,  nonspecific but likely sequela of chronic small vessel ischemic disease.Moderate cerebral atrophy Vascular: No hyperdense vessel or unexpected calcification. Skull: Prior right mastoidectomy.  Otherwise negative. Sinuses/Orbits: Minimal ethmoid air cell mucosal thickening. Orbits are unremarkable. Other: None. IMPRESSION: No acute intracranial abnormality. Chronic small vessel ischemic disease and cerebral atrophy. Electronically Signed   By: Maurine Simmering M.D.   On: 03/27/2021 16:39    Pertinent labs & imaging results that were available during my care of the patient were reviewed by me and considered in my medical decision making (see MDM for details).  Medications Ordered in ED Medications  lactated ringers bolus 1,000 mL (0 mLs Intravenous Stopped 03/27/21 1703)  cefTRIAXone (ROCEPHIN) 1 g in sodium chloride 0.9 % 100 mL IVPB (0 g Intravenous Stopped 03/27/21 1703)                                                                                                                                     Procedures Procedures  (including critical care time)  Medical Decision Making / ED Course   This patient presents to the ED for concern of syncope, this involves an extensive number of treatment options, and is a complaint that carries with it a high risk of complications and morbidity.  The differential diagnosis includes cardiogenic syncope, orthostatic syncope, UTI, electrolyte abnormality  MDM: Patient seen emergency department for evaluation of syncope.  Physical exam reveals a murmur but is otherwise unremarkable.  Patient states this murmur is known.  Laboratory valuation with a glucose of 300, creatinine 2.34 which is the patient's baseline, urinalysis with greater than 50 red blood cells, 21-50 white blood cells, large leuk esterase and rare bacteria, leukocytosis to 11.2.  On chart review, it appears that the patient had a CAT scan 2 weeks ago that did show evidence of pyelonephritis but the  patient was not treated with antibiotics at that time.  No culture was sent.  Patient started on Rocephin and patient will be admitted for recurrent syncope in the setting of a catheter associated UTI.  CT head also obtained that is reassuringly negative.   Additional history obtained: -Additional history obtained from daughter -External records from outside source obtained and reviewed  including: Chart review including previous notes, labs, imaging, consultation notes   Lab Tests: -I ordered, reviewed, and interpreted labs.   The pertinent results include:   Labs Reviewed  CBC WITH DIFFERENTIAL/PLATELET - Abnormal; Notable for the following components:      Result Value   WBC 11.2 (*)    All other components within normal limits  COMPREHENSIVE METABOLIC PANEL - Abnormal; Notable for the following components:   Sodium 133 (*)    Glucose, Bld 300 (*)    BUN 37 (*)    Creatinine, Ser 2.34 (*)    Albumin 3.2 (*)    GFR, Estimated 27 (*)    All other components within normal limits  URINALYSIS, ROUTINE W REFLEX MICROSCOPIC - Abnormal; Notable for the following components:   Color, Urine AMBER (*)    APPearance CLOUDY (*)    Glucose, UA >=500 (*)    Hgb urine dipstick LARGE (*)    Protein, ur 100 (*)    Leukocytes,Ua LARGE (*)    RBC / HPF >50 (*)    Bacteria, UA RARE (*)    All other components within normal limits  MAGNESIUM      EKG \  EKG Interpretation  Date/Time:  Friday March 27 2021 12:41:22 EST Ventricular Rate:  82 PR Interval:    QRS Duration: 133 QT Interval:  374 QTC Calculation: 437 R Axis:   -83 Text Interpretation: Normal sinus rhythm RBBB and LAFB Inferior infarct, old Confirmed by [place] ([id number]) on 03/27/2021 5:20:14 PM         Imaging Studies ordered: I ordered imaging studies including CT head I independently visualized and interpreted imaging. I agree with the radiologist interpretation   Medicines ordered and prescription drug  management: Meds ordered this encounter  Medications   lactated ringers bolus 1,000 mL   cefTRIAXone (ROCEPHIN) 1 g in sodium chloride 0.9 % 100 mL IVPB    Order Specific Question:   Antibiotic Indication:    Answer:   UTI    -I have reviewed the patients home medicines and have made adjustments as needed  Critical interventions None    Cardiac Monitoring: The patient was maintained on a cardiac monitor.  I personally viewed and interpreted the cardiac monitored which showed an underlying rhythm of: NSR  Social Determinants of Health:  Factors impacting patients care include: none   Reevaluation: After the interventions noted above, I reevaluated the patient and found that they have :stayed the same  Co morbidities that complicate the patient evaluation  Past Medical History:  Diagnosis Date   Acid reflux    Aortic insufficiency    Cholecystitis    Combined systolic and diastolic heart failure ([place])    Echo 04/2018: inf-lat HK, EF 40-45, severe basal septal hypertrophy, mild conc LVH, Gr 1 DD, severe LAE, mild to mod TR, mod AI, asc Aorta 39 mm   Coronary artery disease 2002   CABG x5   Diabetes mellitus    DVT (deep venous thrombosis) ([place])    previously on coumadin   Hypercholesteremia    Hyperlipidemia    Mitral valve regurgitation    Skin cancer    tumor removed off of his back      Dispostion: I considered admission for this patient, and due to syncope in the setting of a catheter associated UTI patient will be admitted     Final Clinical Impression(s) / ED Diagnoses Final diagnoses:  Urinary tract infection associated with indwelling urethral catheter, initial  encounter [facility])     @PCDICTATION @    Teressa Lower, MD 03/27/21 1721

## 2021-03-28 DIAGNOSIS — N184 Chronic kidney disease, stage 4 (severe): Secondary | ICD-10-CM

## 2021-03-28 DIAGNOSIS — I5042 Chronic combined systolic (congestive) and diastolic (congestive) heart failure: Secondary | ICD-10-CM

## 2021-03-28 DIAGNOSIS — Z951 Presence of aortocoronary bypass graft: Secondary | ICD-10-CM | POA: Diagnosis not present

## 2021-03-28 DIAGNOSIS — R55 Syncope and collapse: Secondary | ICD-10-CM | POA: Diagnosis not present

## 2021-03-28 DIAGNOSIS — I10 Essential (primary) hypertension: Secondary | ICD-10-CM

## 2021-03-28 LAB — BASIC METABOLIC PANEL
Anion gap: 6 (ref 5–15)
BUN: 34 mg/dL — ABNORMAL HIGH (ref 8–23)
CO2: 21 mmol/L — ABNORMAL LOW (ref 22–32)
Calcium: 9.3 mg/dL (ref 8.9–10.3)
Chloride: 108 mmol/L (ref 98–111)
Creatinine, Ser: 1.89 mg/dL — ABNORMAL HIGH (ref 0.61–1.24)
GFR, Estimated: 34 mL/min — ABNORMAL LOW (ref >60)
Glucose, Bld: 181 mg/dL — ABNORMAL HIGH (ref 70–99)
Potassium: 4 mmol/L (ref 3.5–5.1)
Sodium: 135 mmol/L (ref 135–145)

## 2021-03-28 LAB — CBC
HCT: 37 % — ABNORMAL LOW (ref 39.0–52.0)
Hemoglobin: 12.4 g/dL — ABNORMAL LOW (ref 13.0–17.0)
MCH: 30.8 pg (ref 26.0–34.0)
MCHC: 33.5 g/dL (ref 30.0–36.0)
MCV: 91.8 fL (ref 80.0–100.0)
Platelets: 278 10*3/uL (ref 150–400)
RBC: 4.03 MIL/uL — ABNORMAL LOW (ref 4.22–5.81)
RDW: 12.9 % (ref 11.5–15.5)
WBC: 8.4 10*3/uL (ref 4.0–10.5)
nRBC: 0 % (ref 0.0–0.2)

## 2021-03-28 MED ORDER — ROSUVASTATIN CALCIUM 10 MG PO TABS
10.0000 mg | ORAL_TABLET | Freq: Every day | ORAL | Status: DC
  Administered 2021-03-28 – 2021-03-30 (×3): 10 mg via ORAL
  Filled 2021-03-28 (×4): qty 1

## 2021-03-28 MED ORDER — METOPROLOL TARTRATE 25 MG PO TABS
12.5000 mg | ORAL_TABLET | Freq: Two times a day (BID) | ORAL | Status: DC
  Administered 2021-03-28 – 2021-03-30 (×4): 12.5 mg via ORAL
  Filled 2021-03-28 (×4): qty 1

## 2021-03-28 MED ORDER — CHLORHEXIDINE GLUCONATE CLOTH 2 % EX PADS
6.0000 | MEDICATED_PAD | Freq: Every day | CUTANEOUS | Status: DC
  Administered 2021-03-28 – 2021-03-30 (×3): 6 via TOPICAL

## 2021-03-28 MED ORDER — SODIUM CHLORIDE 0.9 % IV SOLN: INTRAVENOUS | Status: DC

## 2021-03-28 NOTE — Consult Note (Signed)
Cardiology Consultation:   Patient ID: Nathan [name] MRN: [id number]; DOB: 09-06-1933  Admit date: 03/27/2021 Date of Consult: 03/28/2021  PCP:  Donnajean Lopes, MD   [facility] Cardiologist:  [name], MD        Patient Profile:   Nathan [name] is a 88 y.o. male with a hx of CABG x 5 2002, NSTEMI 04/2020 in setting of sepsis >> med rx w/ EF 40-45% & mod MS w/ severe TR (not a surgical candidate), cholecystitis, DM, HLD, AI, DVT 2019, HFprEF, who is being seen 03/28/2021 for the evaluation of syncope at the request of Dr Reesa Chew.  History of Present Illness:   Nathan [name] is a 88 year old male with the history as detailed above who is followed by Dr. Acie Fredrickson as an out-patient. He has been having several episodes of syncope over the past year with concern for orthostasis. His antihypertensives were held and his lasix and tamsulosin were stopped with initial improvement. Cardiac monitor 12/30/20 showed he was having episodes of atrial flutter with a bundle branch block, and was started on metoprolol. He is also Eliquis at a reduced dose.  He had an ER visit 1/26 for constipation and vomiting.  Significant urinary retention greater than 900 cc.  Foley cath placed. He was noted to have severe prostatomegaly and was to follow-up with urology  He then represented to the ER on this admission for syncope for which Cardiology has been consulted.   The patient states that over the several weeks, he has been having more episodes of dizziness. This acutely worsened after having his foley placed for urinary retention as he has not been using his compression socks. Notably, this has usually been when getting up to walk with very few episodes while sitting. All of his episodes of syncope occurred while standing with none while sitting. He denies any preceding chest pain, SOB, nausea, diaphoresis, or palpitations. His symptoms initially improved after his visit with Dr. Acie Fredrickson with holding his  blood pressure medications, diuretics and with using compressions socks.  Currently, the patient feels okay. No dizziness. He is frustrated that this keeps happening.     Past Medical History:  Diagnosis Date   Acid reflux    Aortic insufficiency    Cholecystitis    Combined systolic and diastolic heart failure ([facility])    Echo 04/2018: inf-lat HK, EF 40-45, severe basal septal hypertrophy, mild conc LVH, Gr 1 DD, severe LAE, mild to mod TR, mod AI, asc Aorta 39 mm   Coronary artery disease 2002   CABG x5   Diabetes mellitus    DVT (deep venous thrombosis) (HCC)    previously on coumadin   Hypercholesteremia    Hyperlipidemia    Mitral valve regurgitation    Skin cancer    tumor removed off of his back    Past Surgical History:  Procedure Laterality Date   BILIARY DRAINAGE CATHETER PLACEMENT W/ BILE DUCT TUBE CHANGE  01/2015   CORONARY ARTERY BYPASS GRAFT  12/21/2000   x5   IR CATHETER TUBE CHANGE  06/18/2020   IR CHOLANGIOGRAM EXISTING TUBE  12/08/2020   IR CHOLANGIOGRAM EXISTING TUBE  12/16/2020   IR EXCHANGE BILIARY DRAIN  06/18/2020   IR EXCHANGE BILIARY DRAIN  08/13/2020   IR EXCHANGE BILIARY DRAIN  10/21/2020   IR EXCHANGE BILIARY DRAIN  11/20/2020   IR PERC CHOLECYSTOSTOMY  05/02/2020   IR RADIOLOGIST EVAL & MGMT  10/24/2020   IR RADIOLOGIST EVAL & MGMT  12/02/2020   IR REMOVAL OF CALCULI/DEBRIS BILIARY DUCT/GB  11/20/2020   IR US GUIDANCE  05/02/2020   LEFT HEART CATH AND CORS/GRAFTS ANGIOGRAPHY N/A 04/06/2018   Procedure: LEFT HEART CATH AND CORS/GRAFTS ANGIOGRAPHY;  Surgeon: Lorretta Harp, MD;  Location: [facility];  Service: Cardiovascular;  Laterality: N/A;   SKIN CANCER EXCISION     TUMOR REMOVAL     off of back, skin cancer     Home Medications:  Prior to Admission medications   Medication Sig Start Date End Date Taking? Authorizing Provider  albuterol (VENTOLIN HFA) 108 (90 Base) MCG/ACT inhaler Inhale 2 puffs into the lungs 2 (two) times daily. 11/13/19   Yes [provider]  apixaban (ELIQUIS) 2.5 MG TABS tablet Take 2.5 mg by mouth 2 (two) times daily.   Yes [provider]  cholecalciferol (VITAMIN D3) 25 MCG (1000 UNIT) tablet Take 1,000 Units by mouth daily.   Yes [provider]  fenofibrate (TRICOR) 145 MG tablet TAKE 1 TABLET BY MOUTH EVERY DAY 03/23/21  Yes Nahser, Wonda Cheng, MD  finasteride (PROSCAR) 5 MG tablet Take 5 mg by mouth daily.   Yes [provider]  insulin NPH-regular Human (NOVOLIN 70/30) (70-30) 100 UNIT/ML injection Inject 10-25 Units into the skin See admin instructions. 25 units in the morning & 10 units at dinner Sliding Scale if BS>150   Yes [provider]  megestrol (MEGACE) 20 MG tablet Take 20 mg by mouth daily. 08/01/20  Yes [provider]  metoprolol tartrate (LOPRESSOR) 25 MG tablet Take 1 tablet (25 mg total) by mouth 2 (two) times daily. 12/30/20 03/30/21 Yes Nahser, Wonda Cheng, MD  Multiple Vitamins-Minerals (CENTRUM SILVER PO) Take 1 tablet by mouth daily.   Yes [provider]  omeprazole (PRILOSEC) 20 MG capsule Take 20 mg by mouth 2 (two) times daily.  01/06/11  Yes [provider]  OVER THE COUNTER MEDICATION Take 400 mg by mouth in the morning and at bedtime. [place] Super Magnesium   Yes [provider]  Blood Glucose Monitoring Suppl (ONETOUCH VERIO) w/Device KIT  09/06/17   [provider]  feeding supplement (ENSURE ENLIVE / ENSURE PLUS) LIQD Take 237 mLs by mouth 2 (two) times daily between meals. Patient not taking: Reported on 03/27/2021 05/12/20   Geradine Girt, DO  Lancets [facility] ULTRASOFT) lancets  09/06/17   [provider]  mirtazapine (REMERON) 7.5 MG tablet Take 1 tablet (7.5 mg total) by mouth at bedtime. Patient not taking: Reported on 12/30/2020 05/12/20   Geradine Girt, DO  nitroGLYCERIN (NITROSTAT) 0.4 MG SL tablet Place 1 tablet (0.4 mg total) under the tongue every 5 (five) minutes as needed for  chest pain. Patient not taking: Reported on 03/27/2021 04/04/18   Richardson Dopp T, PA-C  ondansetron (ZOFRAN ODT) 4 MG disintegrating tablet Take 1 tablet (4 mg total) by mouth every 8 (eight) hours as needed for up to 15 doses for nausea or vomiting. Patient not taking: Reported on 03/27/2021 07/30/20   Wyvonnia Dusky, MD  [facility] VERIO test strip  09/06/17   [provider]  [place] 1ML/31G 31G X 5/16" 1 ML MISC USE 1 TWICE DAILY AS DIRECTED 09/29/17   [provider]  Sodium Chloride Flush (NORMAL SALINE FLUSH) 0.9 % SOLN Flush once daily with 10 mls as directed Patient not taking: Reported on 03/27/2021 06/13/20   Corrie Mckusick, DO    Inpatient Medications: Scheduled Meds:  albuterol  3 mL Inhalation BID   apixaban  2.5 mg Oral BID   metoprolol tartrate  25 mg Oral BID   sodium chloride flush  3 mL Intravenous Q12H   Continuous Infusions:  sodium chloride     cefTRIAXone (ROCEPHIN)  IV     PRN Meds: sodium chloride, sodium chloride flush  Allergies:    Allergies  Allergen Reactions   Flomax [Tamsulosin Hcl] Other (See Comments)    Dizzy   Lasix [Furosemide]     Dizziness    Social History:   Social History   Socioeconomic History   Marital status: Divorced    Spouse name: Not on file   Number of children: Not on file   Years of education: Not on file   Highest education level: Not on file  Occupational History   Not on file  Tobacco Use   Smoking status: Never   Smokeless tobacco: Never  Vaping Use   Vaping Use: Never used  Substance and Sexual Activity   Alcohol use: No   Drug use: No   Sexual activity: Not Currently  Other Topics Concern   Not on file  Social History Narrative   Not on file   Social Determinants of Health   Financial Resource Strain: Not on file  Food Insecurity: Not on file  Transportation Needs: Not on file  Physical Activity: Not on file  Stress: Not on file  Social Connections: Not on file   Intimate Partner Violence: Not on file    Family History:   Family History  Problem Relation Age of Onset   Heart attack Father    Hypertension Mother    Diabetes Brother    Cancer Sister        breast   Cancer Brother        throat & mouth     ROS:  Please see the history of present illness.  All other ROS reviewed and negative.     Physical Exam/Data:   Vitals:   03/27/21 2330 03/28/21 0230 03/28/21 0500 03/28/21 0730  BP: 132/77 (!) 160/128 (!) 145/71 (!) 168/78  Pulse: 79 72 62 66  Resp: 18 19 15 20   Temp:      TempSrc:      SpO2: 93% 96% 100% 95%  Weight:      Height:        Intake/Output Summary (Last 24 hours) at 03/28/2021 0742 Last data filed at 03/28/2021 0739 Gross per 24 hour  Intake --  Output 500 ml  Net -500 ml   Last 3 Weights 03/27/2021 03/12/2021 12/30/2020  Weight (lbs) 166 lb 166 lb 161 lb 9.6 oz  Weight (kg) 75.297 kg 75.297 kg 73.301 kg     Body mass index is 23.15 kg/m.  General:  Elderly male, comfortable HEENT: normal Neck: no JVD Vascular: No carotid bruits; Distal pulses 2+ bilaterally Cardiac:  RR, 2/6 systolic murmur Lungs:  clear to auscultation bilaterally, no wheezing, rhonchi or rales  Abd: soft, nontender, no hepatomegaly  Ext: no edema Musculoskeletal:  No deformities, BUE and BLE strength normal and equal Skin: warm and dry  Neuro:  CNs 2-12 intact, no focal abnormalities noted Psych:  Normal affect   EKG:  The EKG was personally reviewed and demonstrates:  Aflutter with RBBB, LAFB Telemetry:  Telemetry was personally reviewed and demonstrates:  Aflutter with controlled rates  Relevant CV Studies:  14 Day Monitor 01/2021 Addendum by Thayer Headings, MD on Mon Feb 02, 2021  5:33  PM Sinus rhythm Episodes of non-sustained atrial flutter with BBB .   Finalized by Thayer Headings, MD on Mon Feb 02, 2021  5:17 PM Sinus rhythm Episodes of non sustained SVT with abberency  ECHO: 05/02/2020  1. Left ventricular  ejection fraction, by estimation, is 40 to 45%. The  left ventricle has mildly decreased function. The left ventricle  demonstrates regional wall motion abnormalities (see scoring  diagram/findings for description). Left ventricular  diastolic parameters are indeterminate.   2. Right ventricular systolic function is mildly reduced. The right  ventricular size is mildly enlarged. There is mildly elevated pulmonary artery systolic pressure. The IVC is not well seen, but using an estimated RA pressure of 8, the estimated right ventricular systolic pressure is 35.0 mmHg.   3. Left atrial size was moderately dilated.   4. Mitral regurgitation is anteriorly directed and eccentric, and may be underestimated. There is a small flail segment best seen in parasternal long axis views. . The mitral valve is degenerative. Mild to moderate mitral valve regurgitation. No evidence of mitral stenosis.   5. Tricuspid valve regurgitation is severe.   6. The aortic valve is abnormal. There is moderate calcification of the aortic valve. Aortic valve regurgitation is mild to moderate. No aortic stenosis is present.   CARDIAC CATH: 04/06/2018 Ost LM to Dist LM lesion is 99% stenosed. Prox RCA lesion is 99% stenosed. Mid RCA lesion is 100% stenosed. Origin to Prox Graft lesion is 100% stenosed. Origin to Prox Graft lesion is 100% stenosed. Ost Cx to Prox Cx lesion is 99% stenosed. Prox Cx lesion is 100% stenosed. Ost LAD to Prox LAD lesion is 100% stenosed. Diagnostic Dominance: Right   Laboratory Data:  High Sensitivity Troponin:  No results for input(s): TROPONINIHS in the last 720 hours.   Chemistry Recent Labs  Lab 03/27/21 1253 03/28/21 0435  NA 133* 135  K 4.6 4.0  CL 104 108  CO2 22 21*  GLUCOSE 300* 181*  BUN 37* 34*  CREATININE 2.34* 1.89*  CALCIUM 9.5 9.3  MG 2.0  --   GFRNONAA 27* 34*  ANIONGAP 7 6    Recent Labs  Lab 03/27/21 1253  PROT 7.0  ALBUMIN 3.2*  AST 35  ALT 22  ALKPHOS  54  BILITOT 0.4   Lipids No results for input(s): CHOL, TRIG, HDL, LABVLDL, LDLCALC, CHOLHDL in the last 168 hours.  Hematology Recent Labs  Lab 03/27/21 1253 03/28/21 0435  WBC 11.2* 8.4  RBC 4.26 4.03*  HGB 13.2 12.4*  HCT 40.0 37.0*  MCV 93.9 91.8  MCH 31.0 30.8  MCHC 33.0 33.5  RDW 13.2 12.9  PLT 316 278   Thyroid No results for input(s): TSH, FREET4 in the last 168 hours.  BNPNo results for input(s): BNP, PROBNP in the last 168 hours.  DDimer No results for input(s): DDIMER in the last 168 hours.   Radiology/Studies:  CT Head Wo Contrast  Result Date: 03/27/2021 CLINICAL DATA:  Head trauma, minor (Age >= 65y) EXAM: CT HEAD WITHOUT CONTRAST TECHNIQUE: Contiguous axial images were obtained from the base of the skull through the vertex without intravenous contrast. RADIATION DOSE REDUCTION: This exam was performed according to the departmental dose-optimization program which includes automated exposure control, adjustment of the mA and/or kV according to patient size and/or use of iterative reconstruction technique. COMPARISON:  None. FINDINGS: Brain: No evidence of acute intracranial hemorrhage. Prominent symmetric CSF space overlying the bilateral hemispheres likely related to cerebral atrophy.No evidence of mass  or concerning mass effect.The ventricular system is in proportion to degree of cerebral atrophy.Scattered subcortical and periventricular white matter hypodensities, nonspecific but likely sequela of chronic small vessel ischemic disease.Moderate cerebral atrophy Vascular: No hyperdense vessel or unexpected calcification. Skull: Prior right mastoidectomy.  Otherwise negative. Sinuses/Orbits: Minimal ethmoid air cell mucosal thickening. Orbits are unremarkable. Other: None. IMPRESSION: No acute intracranial abnormality. Chronic small vessel ischemic disease and cerebral atrophy. Electronically Signed   By: Maurine Simmering M.D.   On: 03/27/2021 16:39     Assessment and Plan:    #Syncope:  Patient with frequent episodes of syncope over the past year thought to be due to orthostasis. Anti-hypertensives have been held and tamsulosin and lasix have been held with initial improvement. Symptoms have acutely worsened over the past 2 weeks in the setting of urinary retention requiring foley placement, UTI and discontinuing his compression socks. Overall, suspect he continues to have significant orthostatic symptoms with lower suspicion of underlying arrhythmogenic cause.  -Management of UTI and urinary retention per primary -Check orthostatics -Will decrease metoprolol further to 12.40m BID -Holding diuretics and tamsulosin -Recommended resuming compression socks -Encourage hydration and regular meals -Slow position changes -Will place 30d monitor following discharge as episodes of syncope not captured on last monitor  #CAD s/p CABG: Trop flat. ECG nonischemic. No chest pain.  -Not on ASA given need for apiaxaban -Continue fenofibrates -Will start crestor 179mdaily  #Afib/Flutter: Rate controlled. -Continue apixaban 2.97m42mID -Decrease metop to 12.97mg38mD given orthostasis as above  #Chronic Combined Systolic and Diastolic HF: EF 40-408-14%ld RV dysfunction, mild PHTN, moderate MR, severe TR, mild-to-moderate AI. Euvolemic.  -Holding diuretics and antihypertensives due to concern for orthstasis as above -Monitor I/Os and daily weights  #Multivalvular Disease: Severe TR, mild-to-moderate AI, moderate MR. Not a surgical candidate. On medical management. -Continue out-patient follow-up  #UTI: #Urinary Retention: S/p foley placement. -Management per primary -Follow-up with Urology as scheduled  #CKD IV: -Stable  Okay to discharge home from CV perspective. Will arrange for 30d monitor at discharge.    Risk Assessment/Risk Scores:        New York Heart Association (NYHA) Functional Class NYHA Class II  CHA2DS2-VASc Score = 5   This indicates a 7.2%  annual risk of stroke. The patient's score is based upon: CHF History: 1 HTN History: 0 Diabetes History: 1 Stroke History: 0 Vascular Disease History: 1 Age Score: 2 Gender Score: 0   For questions or updates, please contact [email or web address] Please consult [email or web address] for contact info under    Signed, RhonRosaria Ferries-C  03/28/2021 7:42 AM

## 2021-03-28 NOTE — Progress Notes (Signed)
PROGRESS NOTE    Nathan [name]  NOB:[id number] DOB: 04-Apr-1933 DOA: 03/27/2021 PCP: Donnajean Lopes, MD   Brief Narrative:   88 yo CAD CABG, AI, MVR, for recurrent syncope. He has had 2 week Holter monitor without an events.    Assessment and Plan: No notes have been filed under this hospital service. Service: Hospitalist  Recurrent Syncope -gently hydration, BB dose reduced, monitor urine output. PT/OT Will need output monitor.  Hold diuretics and Flomax.   CAD s/p CABG with valvular disease.  Chronic systolic CHF ef 94% -Cont home Eliquis, and statin. No chest pain.   Essential HTN -Cont metoprolol- dose reduced 12.5mg  BID  CKD Stage IV -Cr around baseline of 2.0  Recurrent Urinary retention with UTI -On Empiric Rocephin. Gentle IVF as needed.     DVT prophylaxis: On Eliquis  Code Status: DNR Family Communication:    Status is: Observation The patient remains OBS appropriate and will d/c before 2 midnights.    Subjective: Feeling ok, afraid of dizziness when he gets up. Overall feels weak.   Examination:  General exam: Appears calm and comfortable  Respiratory system: Clear to auscultation. Respiratory effort normal. Cardiovascular system: S1 & S2 heard, RRR. No JVD, murmurs, rubs, gallops or clicks. No pedal edema. Gastrointestinal system: Abdomen is nondistended, soft and nontender. No organomegaly or masses felt. Normal bowel sounds heard. Central nervous system: Alert and oriented. No focal neurological deficits. Extremities: Symmetric 5 x 5 power. Skin: No rashes, lesions or ulcers Psychiatry: Judgement and insight appear normal. Mood & affect appropriate.   Objective: Vitals:   03/28/21 0500 03/28/21 0730 03/28/21 0830 03/28/21 1130  BP: (!) 145/71 (!) 168/78 (!) 142/60 (!) 166/75  Pulse: 62 66 75 76  Resp: 15 20 18  (!) 26  Temp:      TempSrc:      SpO2: 100% 95% 97% 98%  Weight:      Height:        Intake/Output Summary (Last 24 hours)  at 03/28/2021 1411 Last data filed at 03/28/2021 0739 Gross per 24 hour  Intake --  Output 500 ml  Net -500 ml   Filed Weights   03/27/21 1244  Weight: 75.3 kg     Data Reviewed:   CBC: Recent Labs  Lab 03/27/21 1253 03/28/21 0435  WBC 11.2* 8.4  NEUTROABS 6.2  --   HGB 13.2 12.4*  HCT 40.0 37.0*  MCV 93.9 91.8  PLT 316 765   Basic Metabolic Panel: Recent Labs  Lab 03/27/21 1253 03/28/21 0435  NA 133* 135  K 4.6 4.0  CL 104 108  CO2 22 21*  GLUCOSE 300* 181*  BUN 37* 34*  CREATININE 2.34* 1.89*  CALCIUM 9.5 9.3  MG 2.0  --    GFR: Estimated Creatinine Clearance: 30.4 mL/min (A) (by C-G formula based on SCr of 1.89 mg/dL (H)). Liver Function Tests: Recent Labs  Lab 03/27/21 1253  AST 35  ALT 22  ALKPHOS 54  BILITOT 0.4  PROT 7.0  ALBUMIN 3.2*   No results for input(s): LIPASE, AMYLASE in the last 168 hours. No results for input(s): AMMONIA in the last 168 hours. Coagulation Profile: No results for input(s): INR, PROTIME in the last 168 hours. Cardiac Enzymes: No results for input(s): CKTOTAL, CKMB, CKMBINDEX, TROPONINI in the last 168 hours. BNP (last 3 results) No results for input(s): PROBNP in the last 8760 hours. HbA1C: No results for input(s): HGBA1C in the last 72 hours. CBG: No results for input(s):  GLUCAP in the last 168 hours. Lipid Profile: No results for input(s): CHOL, HDL, LDLCALC, TRIG, CHOLHDL, LDLDIRECT in the last 72 hours. Thyroid Function Tests: No results for input(s): TSH, T4TOTAL, FREET4, T3FREE, THYROIDAB in the last 72 hours. Anemia Panel: No results for input(s): VITAMINB12, FOLATE, FERRITIN, TIBC, IRON, RETICCTPCT in the last 72 hours. Sepsis Labs: No results for input(s): PROCALCITON, LATICACIDVEN in the last 168 hours.  Recent Results (from the past 240 hour(s))  Resp Panel by RT-PCR (Flu A&B, Covid) Nasopharyngeal Swab     Status: None   Collection Time: 03/27/21  7:44 PM   Specimen: Nasopharyngeal Swab;  Nasopharyngeal(NP) swabs in vial transport medium  Result Value Ref Range Status   SARS Coronavirus 2 by RT PCR NEGATIVE NEGATIVE Final    Comment: (NOTE) SARS-CoV-2 target nucleic acids are NOT DETECTED.  The SARS-CoV-2 RNA is generally detectable in upper respiratory specimens during the acute phase of infection. The lowest concentration of SARS-CoV-2 viral copies this assay can detect is 138 copies/mL. A negative result does not preclude SARS-Cov-2 infection and should not be used as the sole basis for treatment or other patient management decisions. A negative result may occur with  improper specimen collection/handling, submission of specimen other than nasopharyngeal swab, presence of viral mutation(s) within the areas targeted by this assay, and inadequate number of viral copies(<138 copies/mL). A negative result must be combined with clinical observations, patient history, and epidemiological information. The expected result is Negative.  Fact Sheet for Patients:  EntrepreneurPulse.com.au  Fact Sheet for Healthcare Providers:  IncredibleEmployment.be  This test is no t yet approved or cleared by the [place] FDA and  has been authorized for detection and/or diagnosis of SARS-CoV-2 by FDA under an Emergency Use Authorization (EUA). This EUA will remain  in effect (meaning this test can be used) for the duration of the COVID-19 declaration under Section 564(b)(1) of the Act, 21 U.S.C.section 360bbb-3(b)(1), unless the authorization is terminated  or revoked sooner.       Influenza A by PCR NEGATIVE NEGATIVE Final   Influenza B by PCR NEGATIVE NEGATIVE Final    Comment: (NOTE) The Xpert Xpress SARS-CoV-2/FLU/RSV plus assay is intended as an aid in the diagnosis of influenza from Nasopharyngeal swab specimens and should not be used as a sole basis for treatment. Nasal washings and aspirates are unacceptable for Xpert Xpress  SARS-CoV-2/FLU/RSV testing.  Fact Sheet for Patients: EntrepreneurPulse.com.au  Fact Sheet for Healthcare Providers: IncredibleEmployment.be  This test is not yet approved or cleared by the [place] FDA and has been authorized for detection and/or diagnosis of SARS-CoV-2 by FDA under an Emergency Use Authorization (EUA). This EUA will remain in effect (meaning this test can be used) for the duration of the COVID-19 declaration under Section 564(b)(1) of the Act, 21 U.S.C. section 360bbb-3(b)(1), unless the authorization is terminated or revoked.  Performed at [facility], [place] [address], [place], [place] [address]          Radiology Studies: CT Head Wo Contrast  Result Date: 03/27/2021 CLINICAL DATA:  Head trauma, minor (Age >= 65y) EXAM: CT HEAD WITHOUT CONTRAST TECHNIQUE: Contiguous axial images were obtained from the base of the skull through the vertex without intravenous contrast. RADIATION DOSE REDUCTION: This exam was performed according to the departmental dose-optimization program which includes automated exposure control, adjustment of the mA and/or kV according to patient size and/or use of iterative reconstruction technique. COMPARISON:  None. FINDINGS: Brain: No evidence of acute intracranial hemorrhage.  Prominent symmetric CSF space overlying the bilateral hemispheres likely related to cerebral atrophy.No evidence of mass or concerning mass effect.The ventricular system is in proportion to degree of cerebral atrophy.Scattered subcortical and periventricular white matter hypodensities, nonspecific but likely sequela of chronic small vessel ischemic disease.Moderate cerebral atrophy Vascular: No hyperdense vessel or unexpected calcification. Skull: Prior right mastoidectomy.  Otherwise negative. Sinuses/Orbits: Minimal ethmoid air cell mucosal thickening. Orbits are unremarkable. Other: None. IMPRESSION: No acute  intracranial abnormality. Chronic small vessel ischemic disease and cerebral atrophy. Electronically Signed   By: Maurine Simmering M.D.   On: 03/27/2021 16:39        Scheduled Meds:  albuterol  3 mL Inhalation BID   apixaban  2.5 mg Oral BID   metoprolol tartrate  12.5 mg Oral BID   rosuvastatin  10 mg Oral Daily   sodium chloride flush  3 mL Intravenous Q12H   Continuous Infusions:  sodium chloride     cefTRIAXone (ROCEPHIN)  IV Stopped (03/28/21 1042)     LOS: 0 days   Time spent= 35 mins    Dione Mccombie Arsenio Loader, MD [facility]  If 7PM-7AM, please contact night-coverage  03/28/2021, 2:11 PM

## 2021-03-28 NOTE — ED Notes (Signed)
Pt sitting fowlers in bed, NAD, a/ox4, asking when he is going upstairs to his bed. Pt denies dizziness, headache, blurry vision, CP, SOB, ABD pain, n/v/ds at this time. VSS

## 2021-03-29 ENCOUNTER — Other Ambulatory Visit: Payer: Self-pay | Admitting: Cardiology

## 2021-03-29 DIAGNOSIS — I454 Nonspecific intraventricular block: Secondary | ICD-10-CM | POA: Diagnosis present

## 2021-03-29 DIAGNOSIS — T83511A Infection and inflammatory reaction due to indwelling urethral catheter, initial encounter: Secondary | ICD-10-CM | POA: Diagnosis present

## 2021-03-29 DIAGNOSIS — R55 Syncope and collapse: Secondary | ICD-10-CM

## 2021-03-29 DIAGNOSIS — I130 Hypertensive heart and chronic kidney disease with heart failure and stage 1 through stage 4 chronic kidney disease, or unspecified chronic kidney disease: Secondary | ICD-10-CM | POA: Diagnosis present

## 2021-03-29 DIAGNOSIS — N184 Chronic kidney disease, stage 4 (severe): Secondary | ICD-10-CM | POA: Diagnosis present

## 2021-03-29 DIAGNOSIS — I4892 Unspecified atrial flutter: Secondary | ICD-10-CM | POA: Diagnosis present

## 2021-03-29 DIAGNOSIS — Y92531 Health care provider office as the place of occurrence of the external cause: Secondary | ICD-10-CM | POA: Diagnosis not present

## 2021-03-29 DIAGNOSIS — N136 Pyonephrosis: Secondary | ICD-10-CM | POA: Diagnosis present

## 2021-03-29 DIAGNOSIS — Z951 Presence of aortocoronary bypass graft: Secondary | ICD-10-CM | POA: Diagnosis not present

## 2021-03-29 DIAGNOSIS — K219 Gastro-esophageal reflux disease without esophagitis: Secondary | ICD-10-CM | POA: Diagnosis present

## 2021-03-29 DIAGNOSIS — Z66 Do not resuscitate: Secondary | ICD-10-CM | POA: Diagnosis present

## 2021-03-29 DIAGNOSIS — S41111A Laceration without foreign body of right upper arm, initial encounter: Secondary | ICD-10-CM | POA: Diagnosis present

## 2021-03-29 DIAGNOSIS — I5042 Chronic combined systolic (congestive) and diastolic (congestive) heart failure: Secondary | ICD-10-CM | POA: Diagnosis present

## 2021-03-29 DIAGNOSIS — I10 Essential (primary) hypertension: Secondary | ICD-10-CM | POA: Diagnosis not present

## 2021-03-29 DIAGNOSIS — R338 Other retention of urine: Secondary | ICD-10-CM | POA: Diagnosis present

## 2021-03-29 DIAGNOSIS — Y846 Urinary catheterization as the cause of abnormal reaction of the patient, or of later complication, without mention of misadventure at the time of the procedure: Secondary | ICD-10-CM | POA: Diagnosis present

## 2021-03-29 DIAGNOSIS — W19XXXA Unspecified fall, initial encounter: Secondary | ICD-10-CM | POA: Diagnosis present

## 2021-03-29 DIAGNOSIS — Z8249 Family history of ischemic heart disease and other diseases of the circulatory system: Secondary | ICD-10-CM | POA: Diagnosis not present

## 2021-03-29 DIAGNOSIS — I083 Combined rheumatic disorders of mitral, aortic and tricuspid valves: Secondary | ICD-10-CM | POA: Diagnosis present

## 2021-03-29 DIAGNOSIS — N401 Enlarged prostate with lower urinary tract symptoms: Secondary | ICD-10-CM | POA: Diagnosis present

## 2021-03-29 DIAGNOSIS — Z833 Family history of diabetes mellitus: Secondary | ICD-10-CM | POA: Diagnosis not present

## 2021-03-29 DIAGNOSIS — I4891 Unspecified atrial fibrillation: Secondary | ICD-10-CM | POA: Diagnosis present

## 2021-03-29 DIAGNOSIS — Z85828 Personal history of other malignant neoplasm of skin: Secondary | ICD-10-CM | POA: Diagnosis not present

## 2021-03-29 DIAGNOSIS — Z20822 Contact with and (suspected) exposure to covid-19: Secondary | ICD-10-CM | POA: Diagnosis present

## 2021-03-29 DIAGNOSIS — E7800 Pure hypercholesterolemia, unspecified: Secondary | ICD-10-CM | POA: Diagnosis present

## 2021-03-29 DIAGNOSIS — B9620 Unspecified Escherichia coli [E. coli] as the cause of diseases classified elsewhere: Secondary | ICD-10-CM | POA: Diagnosis present

## 2021-03-29 DIAGNOSIS — N390 Urinary tract infection, site not specified: Secondary | ICD-10-CM | POA: Diagnosis present

## 2021-03-29 DIAGNOSIS — I2510 Atherosclerotic heart disease of native coronary artery without angina pectoris: Secondary | ICD-10-CM | POA: Diagnosis present

## 2021-03-29 DIAGNOSIS — E860 Dehydration: Secondary | ICD-10-CM | POA: Diagnosis present

## 2021-03-29 DIAGNOSIS — E1122 Type 2 diabetes mellitus with diabetic chronic kidney disease: Secondary | ICD-10-CM | POA: Diagnosis present

## 2021-03-29 LAB — GLUCOSE, CAPILLARY
Glucose-Capillary: 147 mg/dL — ABNORMAL HIGH (ref 70–99)
Glucose-Capillary: 209 mg/dL — ABNORMAL HIGH (ref 70–99)

## 2021-03-29 MED ORDER — HYDRALAZINE HCL 20 MG/ML IJ SOLN: 10.0000 mg | INTRAMUSCULAR | Status: DC | PRN

## 2021-03-29 MED ORDER — ACETAMINOPHEN 325 MG PO TABS
650.0000 mg | ORAL_TABLET | Freq: Four times a day (QID) | ORAL | Status: DC | PRN
  Administered 2021-03-30: 650 mg via ORAL
  Filled 2021-03-29: qty 2

## 2021-03-29 MED ORDER — GUAIFENESIN 100 MG/5ML PO LIQD: 5.0000 mL | ORAL | Status: DC | PRN

## 2021-03-29 MED ORDER — METOPROLOL TARTRATE 5 MG/5ML IV SOLN: 5.0000 mg | INTRAVENOUS | Status: DC | PRN

## 2021-03-29 MED ORDER — SODIUM CHLORIDE 0.9 % IV SOLN: INTRAVENOUS | Status: AC

## 2021-03-29 MED ORDER — SENNOSIDES-DOCUSATE SODIUM 8.6-50 MG PO TABS: 1.0000 | ORAL_TABLET | Freq: Every evening | ORAL | Status: DC | PRN

## 2021-03-29 NOTE — Progress Notes (Signed)
Writer notified by central telemetry Pt had 13 beat run of Vtach. Pt asymptomatic and denies any complaints. BP:147/72, HR:76, SpO2:98% via RA. Provider notified. No new orders at this time.

## 2021-03-29 NOTE — Evaluation (Signed)
Physical Therapy Evaluation Patient Details Name: Nathan [name] MRN: [id number] DOB: April 03, 1933 Today's Date: 03/29/2021  History of Present Illness  88 yo male admitted with syncope, fall weakness. Hx of recurrent/chronic dizziness/syncope for ~1 year per pt, DM, CHF, CKD, CABG, CAD,DVT, MVR, NSTEMI  Clinical Impression  On eval, pt was Supv-Min guard assist for mobility. He was wearing TED hose and receiving IV fluids this session. He walked x 4 (short distances for safety on today). Some unsteadiness and general weakness observed. He only had 1 brief instance of dizziness but it resolved fairly quickly. BP: 148/81-sitting; 120/78-standing; 115/76 after walking. Will plan to follow and progress activity as tolerated. Pt is agreeable to HHPT f/u.        Recommendations for follow up therapy are one component of a multi-disciplinary discharge planning process, led by the attending physician.  Recommendations may be updated based on patient status, additional functional criteria and insurance authorization.  Follow Up Recommendations Home health PT    Assistance Recommended at Discharge Frequent or constant Supervision/Assistance  Patient can return home with the following  Assistance with cooking/housework;Assist for transportation    Equipment Recommendations None recommended by PT  Recommendations for Other Services       Functional Status Assessment Patient has had a recent decline in their functional status and demonstrates the ability to make significant improvements in function in a reasonable and predictable amount of time.     Precautions / Restrictions Precautions Precautions: Fall Precaution Comments: syncopal episodes Restrictions Weight Bearing Restrictions: No      Mobility  Bed Mobility               General bed mobility comments: oob in recliner    Transfers Overall transfer level: Needs assistance Equipment used: None Transfers: Sit to/from Stand Sit  to Stand: Supervision           General transfer comment: Supv for safety.    Ambulation/Gait Ambulation/Gait assistance: Min guard Gait Distance (Feet): 35 Feet (35'x2, 15'x2) Assistive device: None Gait Pattern/deviations: Step-through pattern, Decreased stride length       General Gait Details: Unsteady intermittently with wobbly knees. Took several short walks (first two around room, last two in hallway). Pt denied dizziness.  Stairs            Wheelchair Mobility    Modified Rankin (Stroke Patients Only)       Balance Overall balance assessment: Needs assistance, History of Falls         Standing balance support: During functional activity Standing balance-[name]: Fair                               Pertinent Vitals/Pain Pain Assessment Pain Assessment: Faces Faces Pain Scale: Hurts a little bit Pain Location: headache Pain Intervention(s): Limited activity within patient's tolerance, Monitored during session    [place] expects to be discharged to:: Private residence Living Arrangements: Children   Type of Home: House Home Access: Ramped entrance       Home Layout: One level Home Equipment: Conservation officer, nature (2 wheels)      Prior Function Prior Level of Function : History of Falls (last six months)             Mobility Comments: ambulatory without a device. ADLs Comments: assistance for meals; was driving until dizzy spells began     Hand Dominance        Extremity/Trunk Assessment  Upper Extremity Assessment Upper Extremity Assessment: Defer to OT evaluation    Lower Extremity Assessment Lower Extremity Assessment: Generalized weakness    Cervical / Trunk Assessment Cervical / Trunk Assessment: Kyphotic  Communication   Communication: No difficulties  Cognition Arousal/Alertness: Awake/alert Behavior During Therapy: WFL for tasks assessed/performed Overall Cognitive Status: Within  Functional Limits for tasks assessed                                          General Comments      Exercises     Assessment/Plan    PT Assessment Patient needs continued PT services  PT Problem List Decreased strength;Decreased mobility;Decreased activity tolerance;Decreased balance;Decreased knowledge of use of DME       PT Treatment Interventions DME instruction;Therapeutic activities;Gait training;Therapeutic exercise;Patient/family education;Balance training;Functional mobility training    PT Goals (Current goals can be found in the Care Plan section)  Acute Rehab PT Goals Patient Stated Goal: find out what's causing dizziness episodes PT Goal Formulation: With patient/family Time For Goal Achievement: 04/12/21 Potential to Achieve Goals: Good    Frequency Min 3X/week     Co-evaluation               AM-PAC PT "6 Clicks" Mobility  Outcome Measure Help needed turning from your back to your side while in a flat bed without using bedrails?: None Help needed moving from lying on your back to sitting on the side of a flat bed without using bedrails?: None Help needed moving to and from a bed to a chair (including a wheelchair)?: A Little Help needed standing up from a chair using your arms (e.g., wheelchair or bedside chair)?: A Little Help needed to walk in hospital room?: A Little Help needed climbing 3-5 steps with a railing? : A Little 6 Click Score: 20    End of Session Equipment Utilized During Treatment: Gait belt Activity Tolerance: Patient tolerated treatment well Patient left: in chair;with call bell/phone within reach;with chair alarm set   PT Visit Diagnosis: Muscle weakness (generalized) (M62.81);Difficulty in walking, not elsewhere classified (R26.2)    Time: 5573-2202 PT Time Calculation (min) (ACUTE ONLY): 28 min   Charges:   PT Evaluation $PT Eval Moderate Complexity: 1 Mod PT Treatments $Gait Training: 8-22 mins            Doreatha Massed, PT Acute Rehabilitation  Office: [phone] Pager: [phone]

## 2021-03-29 NOTE — Plan of Care (Signed)
  Problem: Clinical Measurements: Goal: Respiratory complications will improve Outcome: Progressing   Problem: Safety: Goal: Ability to remain free from injury will improve Outcome: Progressing   

## 2021-03-29 NOTE — Evaluation (Signed)
Occupational Therapy Evaluation Patient Details Name: Nathan [name] MRN: [id number] DOB: 10/16/1933 Today's Date: 03/29/2021   History of Present Illness 88 yo male admitted with syncope, fall weakness. Hx of recurrent/chronic dizziness/syncope for ~1 year per pt, DM, CHF, CKD, CABG, CAD,DVT, MVR, NSTEMI   Clinical Impression   Mr. Corry Ihnen is an 88 year old man who presents with impaired balance and decreased activity tolerance resulting in a recent decline in safety and functional abilities. He is overall min guard for ambulation and all ADLs for safety. No overt loss of balance with use of walker and he is able to use bilateral hands for ADL tasks. He reports feeling weaker than he was a few weeks ago. Patient will benefit from skilled OT services while in hospital to improve deficits and learn compensatory strategies as needed in order to return to PLOF.        Recommendations for follow up therapy are one component of a multi-disciplinary discharge planning process, led by the attending physician.  Recommendations may be updated based on patient status, additional functional criteria and insurance authorization.   Follow Up Recommendations  No OT follow up    Assistance Recommended at Discharge Intermittent Supervision/Assistance  Patient can return home with the following Assistance with cooking/housework;Help with stairs or ramp for entrance    Functional Status Assessment  Patient has had a recent decline in their functional status and demonstrates the ability to make significant improvements in function in a reasonable and predictable amount of time.  Equipment Recommendations  None recommended by OT    Recommendations for Other Services       Precautions / Restrictions Precautions Precautions: Fall Precaution Comments: syncopal episodes Restrictions Weight Bearing Restrictions: No      Mobility Bed Mobility                    Transfers                           Balance Overall balance assessment: History of Falls                                         ADL either performed or assessed with clinical judgement   ADL Overall ADL's : Needs assistance/impaired Eating/Feeding: Independent   Grooming: Min guard;Standing   Upper Body Bathing: Set up;Sitting   Lower Body Bathing: Min guard;Sit to/from stand   Upper Body Dressing : Sitting   Lower Body Dressing: Min guard;Sit to/from stand   Toilet Transfer: Min guard;Regular Toilet;Grab bars;Rolling walker (2 wheels)   Toileting- Water quality scientist and Hygiene: Min guard;Sit to/from stand       Functional mobility during ADLs: Min guard;Rolling walker (2 wheels) General ADL Comments: Use of rolling walker for safety - he doesn't typically use one. Min guard for all tasks for safety. He is able to take his hands off of the walker for ADLs. NO overt loff of balance.     Vision Patient Visual Report: No change from baseline       Perception     Praxis      Pertinent Vitals/Pain Pain Assessment Pain Assessment: No/denies pain     Hand Dominance Right   Extremity/Trunk Assessment Upper Extremity Assessment Upper Extremity Assessment: Overall WFL for tasks assessed   Lower Extremity Assessment Lower Extremity Assessment: Defer to PT evaluation  Cervical / Trunk Assessment Cervical / Trunk Assessment: Kyphotic   Communication Communication Communication: No difficulties   Cognition Arousal/Alertness: Awake/alert Behavior During Therapy: WFL for tasks assessed/performed Overall Cognitive Status: Within Functional Limits for tasks assessed                                       General Comments       Exercises     Shoulder Instructions      Home Living Family/patient expects to be discharged to:: Private residence Living Arrangements: Children Available Help at Discharge: Family;Other (Comment) (near 24/7  assistance) Type of Home: House Home Access: Ramped entrance     Home Layout: One level     Bathroom Shower/Tub: Teacher, early years/pre: Standard     Home Equipment: Conservation officer, nature (2 wheels)          Prior Functioning/Environment Prior Level of Function : History of Falls (last six months)             Mobility Comments: ambulatory without a device. ADLs Comments: assistance for meals; was driving until dizzy spells began, has been taking sink baths        OT Problem List: Impaired balance (sitting and/or standing);Decreased activity tolerance      OT Treatment/Interventions: Self-care/ADL training;DME and/or AE instruction;Therapeutic activities;Balance training;Patient/family education    OT Goals(Current goals can be found in the care plan section) Acute Rehab OT Goals Patient Stated Goal: Get stronger OT Goal Formulation: With patient Time For Goal Achievement: 04/12/21 Potential to Achieve Goals: Good  OT Frequency: Min 2X/week    Co-evaluation              AM-PAC OT "6 Clicks" Daily Activity     Outcome Measure Help from another person eating meals?: None Help from another person taking care of personal grooming?: A Little Help from another person toileting, which includes using toliet, bedpan, or urinal?: A Little Help from another person bathing (including washing, rinsing, drying)?: A Little Help from another person to put on and taking off regular upper body clothing?: A Little Help from another person to put on and taking off regular lower body clothing?: A Little 6 Click Score: 19   End of Session Equipment Utilized During Treatment: Rolling walker (2 wheels) Nurse Communication: Mobility status  Activity Tolerance: Patient tolerated treatment well Patient left: in bed;with call bell/phone within reach;with bed alarm set  OT Visit Diagnosis: Dizziness and giddiness (R42)                Time: 9476-5465 OT Time Calculation  (min): 28 min Charges:  OT General Charges $OT Visit: 1 Visit OT Evaluation $OT Eval Low Complexity: 1 Low OT Treatments $Self Care/Home Management : 8-22 mins  Everlene Cunning, OTR/L [place]  Office [phone] Pager: [phone]   Lenward Chancellor 03/29/2021, 2:15 PM

## 2021-03-29 NOTE — Discharge Instructions (Signed)
Dr. Elmarie Shiley office will arrange for you to wear a monitor again  they will contact you.

## 2021-03-29 NOTE — Progress Notes (Signed)
PROGRESS NOTE    Nathan [name]  NWG:[id number] DOB: Jul 04, 1933 DOA: 03/27/2021 PCP: Donnajean Lopes, MD   Brief Narrative:   88 yo CAD CABG, AI, MVR, for recurrent syncope. He has had 2 week Holter monitor without an events.  Patient appeared clinically dehydrated getting IV fluids during the hospitalization, urine cultures grew gram-negative rods.  Sensitivities are pending.  Cleared by cardiology with outpatient 30-day monitor.     Assessment and Plan: No notes have been filed under this hospital service. Service: Hospitalist  Recurrent Syncope -gently hydration, BB dose reduced, monitor urine output. Currently holding diuretics and Flomax.  Continue gentle hydration.  PT/OT is pending.   CAD s/p CABG with valvular disease.  Chronic systolic CHF ef 57% -Cont home Eliquis, and statin. No chest pain.   Essential HTN -Cont metoprolol- dose reduced 12.5mg  BID.  Plans for outpatient 30-day monitor per cardiology  CKD Stage IV -Cr around baseline of 2.0  Recurrent Urinary retention with UTI -On Empiric Rocephin. Gentle IVF as needed.  Urine cultures growing gram-negative rods, susceptibility pending.    DVT prophylaxis: On Eliquis  Code Status: DNR Family Communication: Daughter was present at bedside  Maintain hospital stay on IV antibiotics.  Patient still feeling dizzy requiring IV fluids.  PT/OT is pending.  In the meantime awaiting urine culture data.  Subjective: Still overall feels weak and dizzy.  Remains afebrile overnight.  Examination:  Constitutional: Not in acute distress Respiratory: Clear to auscultation bilaterally Cardiovascular: Normal sinus rhythm, no rubs Abdomen: Nontender nondistended good bowel sounds Musculoskeletal: No edema noted Skin: No rashes seen Neurologic: CN 2-12 grossly intact.  And nonfocal Psychiatric: Normal judgment and insight. Alert and oriented x 3. Normal mood.  Objective: Vitals:   03/29/21 0643 03/29/21 0730 03/29/21  0816 03/29/21 1025  BP: (!) 184/63 (!) 163/79  (!) 153/76  Pulse: 73 77  88  Resp: 20     Temp: 97.7 F (36.5 C)     TempSrc: Oral     SpO2: 96% 96% 96%   Weight:      Height:        Intake/Output Summary (Last 24 hours) at 03/29/2021 1055 Last data filed at 03/29/2021 1003 Gross per 24 hour  Intake 1827.25 ml  Output 1800 ml  Net 27.25 ml   Filed Weights   03/27/21 1244 03/28/21 1640  Weight: 75.3 kg 70.8 kg     Data Reviewed:   CBC: Recent Labs  Lab 03/27/21 1253 03/28/21 0435  WBC 11.2* 8.4  NEUTROABS 6.2  --   HGB 13.2 12.4*  HCT 40.0 37.0*  MCV 93.9 91.8  PLT 316 846   Basic Metabolic Panel: Recent Labs  Lab 03/27/21 1253 03/28/21 0435  NA 133* 135  K 4.6 4.0  CL 104 108  CO2 22 21*  GLUCOSE 300* 181*  BUN 37* 34*  CREATININE 2.34* 1.89*  CALCIUM 9.5 9.3  MG 2.0  --    GFR: Estimated Creatinine Clearance: 28.6 mL/min (A) (by C-G formula based on SCr of 1.89 mg/dL (H)). Liver Function Tests: Recent Labs  Lab 03/27/21 1253  AST 35  ALT 22  ALKPHOS 54  BILITOT 0.4  PROT 7.0  ALBUMIN 3.2*   No results for input(s): LIPASE, AMYLASE in the last 168 hours. No results for input(s): AMMONIA in the last 168 hours. Coagulation Profile: No results for input(s): INR, PROTIME in the last 168 hours. Cardiac Enzymes: No results for input(s): CKTOTAL, CKMB, CKMBINDEX, TROPONINI in the last 168  hours. BNP (last 3 results) No results for input(s): PROBNP in the last 8760 hours. HbA1C: No results for input(s): HGBA1C in the last 72 hours. CBG: Recent Labs  Lab 03/29/21 0802  GLUCAP 147*   Lipid Profile: No results for input(s): CHOL, HDL, LDLCALC, TRIG, CHOLHDL, LDLDIRECT in the last 72 hours. Thyroid Function Tests: No results for input(s): TSH, T4TOTAL, FREET4, T3FREE, THYROIDAB in the last 72 hours. Anemia Panel: No results for input(s): VITAMINB12, FOLATE, FERRITIN, TIBC, IRON, RETICCTPCT in the last 72 hours. Sepsis Labs: No results for  input(s): PROCALCITON, LATICACIDVEN in the last 168 hours.  Recent Results (from the past 240 hour(s))  Urine Culture     Status: Abnormal (Preliminary result)   Collection Time: 03/27/21  2:22 PM   Specimen: Urine, Catheterized  Result Value Ref Range Status   Specimen Description   Final    URINE, CATHETERIZED Performed at [facility] [address], [place], [place] [address]    Special Requests   Final    NONE Performed at [facility], [place] [address] [place], [place] [address]    Culture (A)  Final    >=100,000 COLONIES/mL GRAM NEGATIVE RODS CULTURE REINCUBATED FOR BETTER GROWTH SUSCEPTIBILITIES TO FOLLOW Performed at [facility], [place] [address] [place], [place] [address]    Report Status PENDING  Incomplete  Resp Panel by RT-PCR (Flu A&B, Covid) Nasopharyngeal Swab     Status: None   Collection Time: 03/27/21  7:44 PM   Specimen: Nasopharyngeal Swab; Nasopharyngeal(NP) swabs in vial transport medium  Result Value Ref Range Status   SARS Coronavirus 2 by RT PCR NEGATIVE NEGATIVE Final    Comment: (NOTE) SARS-CoV-2 target nucleic acids are NOT DETECTED.  The SARS-CoV-2 RNA is generally detectable in upper respiratory specimens during the acute phase of infection. The lowest concentration of SARS-CoV-2 viral copies this assay can detect is 138 copies/mL. A negative result does not preclude SARS-Cov-2 infection and should not be used as the sole basis for treatment or other patient management decisions. A negative result may occur with  improper specimen collection/handling, submission of specimen other than nasopharyngeal swab, presence of viral mutation(s) within the areas targeted by this assay, and inadequate number of viral copies(<138 copies/mL). A negative result must be combined with clinical observations, patient history, and epidemiological information. The expected result is Negative.  Fact Sheet for Patients:   EntrepreneurPulse.com.au  Fact Sheet for Healthcare Providers:  IncredibleEmployment.be  This test is no t yet approved or cleared by the [place] FDA and  has been authorized for detection and/or diagnosis of SARS-CoV-2 by FDA under an Emergency Use Authorization (EUA). This EUA will remain  in effect (meaning this test can be used) for the duration of the COVID-19 declaration under Section 564(b)(1) of the Act, 21 U.S.C.section 360bbb-3(b)(1), unless the authorization is terminated  or revoked sooner.       Influenza A by PCR NEGATIVE NEGATIVE Final   Influenza B by PCR NEGATIVE NEGATIVE Final    Comment: (NOTE) The Xpert Xpress SARS-CoV-2/FLU/RSV plus assay is intended as an aid in the diagnosis of influenza from Nasopharyngeal swab specimens and should not be used as a sole basis for treatment. Nasal washings and aspirates are unacceptable for Xpert Xpress SARS-CoV-2/FLU/RSV testing.  Fact Sheet for Patients: EntrepreneurPulse.com.au  Fact Sheet for Healthcare Providers: IncredibleEmployment.be  This test is not yet approved or cleared by the [place] FDA and has been authorized for detection and/or diagnosis  of SARS-CoV-2 by FDA under an Emergency Use Authorization (EUA). This EUA will remain in effect (meaning this test can be used) for the duration of the COVID-19 declaration under Section 564(b)(1) of the Act, 21 U.S.C. section 360bbb-3(b)(1), unless the authorization is terminated or revoked.  Performed at [facility], [place] [address] [place], [place] [address]          Radiology Studies: CT Head Wo Contrast  Result Date: 03/27/2021 CLINICAL DATA:  Head trauma, minor (Age >= 65y) EXAM: CT HEAD WITHOUT CONTRAST TECHNIQUE: Contiguous axial images were obtained from the base of the skull through the vertex without intravenous contrast. RADIATION DOSE REDUCTION:  This exam was performed according to the departmental dose-optimization program which includes automated exposure control, adjustment of the mA and/or kV according to patient size and/or use of iterative reconstruction technique. COMPARISON:  None. FINDINGS: Brain: No evidence of acute intracranial hemorrhage. Prominent symmetric CSF space overlying the bilateral hemispheres likely related to cerebral atrophy.No evidence of mass or concerning mass effect.The ventricular system is in proportion to degree of cerebral atrophy.Scattered subcortical and periventricular white matter hypodensities, nonspecific but likely sequela of chronic small vessel ischemic disease.Moderate cerebral atrophy Vascular: No hyperdense vessel or unexpected calcification. Skull: Prior right mastoidectomy.  Otherwise negative. Sinuses/Orbits: Minimal ethmoid air cell mucosal thickening. Orbits are unremarkable. Other: None. IMPRESSION: No acute intracranial abnormality. Chronic small vessel ischemic disease and cerebral atrophy. Electronically Signed   By: Maurine Simmering M.D.   On: 03/27/2021 16:39        Scheduled Meds:  albuterol  3 mL Inhalation BID   apixaban  2.5 mg Oral BID   Chlorhexidine Gluconate Cloth  6 each Topical Daily   metoprolol tartrate  12.5 mg Oral BID   rosuvastatin  10 mg Oral Daily   sodium chloride flush  3 mL Intravenous Q12H   Continuous Infusions:  sodium chloride     sodium chloride 50 mL/hr at 03/29/21 1032   cefTRIAXone (ROCEPHIN)  IV 1 g (03/29/21 1034)     LOS: 0 days   Time spent= 35 mins    Marilea Gwynne Arsenio Loader, MD [facility]  If 7PM-7AM, please contact night-coverage  03/29/2021, 10:55 AM

## 2021-03-30 LAB — URINE CULTURE: Culture: >=100000 — AB

## 2021-03-30 LAB — BASIC METABOLIC PANEL
Anion gap: 7 (ref 5–15)
BUN: 34 mg/dL — ABNORMAL HIGH (ref 8–23)
CO2: 19 mmol/L — ABNORMAL LOW (ref 22–32)
Calcium: 8.7 mg/dL — ABNORMAL LOW (ref 8.9–10.3)
Chloride: 108 mmol/L (ref 98–111)
Creatinine, Ser: 2.2 mg/dL — ABNORMAL HIGH (ref 0.61–1.24)
GFR, Estimated: 29 mL/min — ABNORMAL LOW (ref >60)
Glucose, Bld: 298 mg/dL — ABNORMAL HIGH (ref 70–99)
Potassium: 3.9 mmol/L (ref 3.5–5.1)
Sodium: 134 mmol/L — ABNORMAL LOW (ref 135–145)

## 2021-03-30 LAB — CBC
HCT: 34.5 % — ABNORMAL LOW (ref 39.0–52.0)
Hemoglobin: 11.6 g/dL — ABNORMAL LOW (ref 13.0–17.0)
MCH: 31.1 pg (ref 26.0–34.0)
MCHC: 33.6 g/dL (ref 30.0–36.0)
MCV: 92.5 fL (ref 80.0–100.0)
Platelets: 264 10*3/uL (ref 150–400)
RBC: 3.73 MIL/uL — ABNORMAL LOW (ref 4.22–5.81)
RDW: 13 % (ref 11.5–15.5)
WBC: 8.9 10*3/uL (ref 4.0–10.5)
nRBC: 0 % (ref 0.0–0.2)

## 2021-03-30 LAB — MAGNESIUM: Magnesium: 1.7 mg/dL (ref 1.7–2.4)

## 2021-03-30 MED ORDER — METOPROLOL TARTRATE 25 MG PO TABS: 12.5000 mg | ORAL_TABLET | Freq: Two times a day (BID) | ORAL | 0 refills | Status: DC

## 2021-03-30 MED ORDER — CEPHALEXIN 500 MG PO CAPS: 500.0000 mg | ORAL_CAPSULE | Freq: Three times a day (TID) | ORAL | 0 refills | Status: AC

## 2021-03-30 NOTE — Progress Notes (Signed)
Occupational Therapy Treatment Patient Details Name: Nathan [name] MRN: [id number] DOB: Apr 16, 1933 Today's Date: 03/30/2021   History of present illness 88 yo male admitted with syncope, fall weakness. Hx of recurrent/chronic dizziness/syncope for ~1 year per pt, DM, CHF, CKD, CABG, CAD,DVT, MVR, NSTEMI   OT comments  Patient was able to participate in standing bathing tasks with min guard for LB tasks at sink in room with RW. Patient required education for safety and cues for when to take breaks. Patient would continue to benefit from skilled OT services at this time while admitted  to address noted deficits in order to improve overall safety and independence in ADLs.     Recommendations for follow up therapy are one component of a multi-disciplinary discharge planning process, led by the attending physician.  Recommendations may be updated based on patient status, additional functional criteria and insurance authorization.    Follow Up Recommendations  No OT follow up    Assistance Recommended at Discharge Intermittent Supervision/Assistance  Patient can return home with the following  Assistance with cooking/housework;Help with stairs or ramp for entrance;Direct supervision/assist for medications management   Equipment Recommendations  None recommended by OT    Recommendations for Other Services      Precautions / Restrictions Precautions Precautions: Fall Precaution Comments: syncopal episodes Restrictions Weight Bearing Restrictions: No       Mobility Bed Mobility Overal bed mobility: Needs Assistance Bed Mobility: Sit to Supine       Sit to supine: Supervision   General bed mobility comments: with increased time    Transfers                         Balance                                           ADL either performed or assessed with clinical judgement   ADL Overall ADL's : Needs assistance/impaired     Grooming: Min  guard;Standing   Upper Body Bathing: Set up;Standing Upper Body Bathing Details (indicate cue type and reason): at sink in room Lower Body Bathing: Min guard;Sit to/from stand Lower Body Bathing Details (indicate cue type and reason): at sink in room Upper Body Dressing : Standing;Supervision/safety   Lower Body Dressing: Min guard;Sit to/from stand                      Extremity/Trunk Assessment              Vision       Perception     Praxis      Cognition Arousal/Alertness: Awake/alert Behavior During Therapy: WFL for tasks assessed/performed Overall Cognitive Status: Within Functional Limits for tasks assessed                                          Exercises      Shoulder Instructions       General Comments      Pertinent Vitals/ Pain       Pain Assessment Pain Assessment: No/denies pain  Home Living  Prior Functioning/Environment              Frequency  Min 2X/week        Progress Toward Goals  OT Goals(current goals can now be found in the care plan section)  Progress towards OT goals: Progressing toward goals     Plan Discharge plan remains appropriate    Co-evaluation                 AM-PAC OT "6 Clicks" Daily Activity     Outcome Measure   Help from another person eating meals?: None Help from another person taking care of personal grooming?: A Little Help from another person toileting, which includes using toliet, bedpan, or urinal?: A Little Help from another person bathing (including washing, rinsing, drying)?: A Little Help from another person to put on and taking off regular upper body clothing?: A Little Help from another person to put on and taking off regular lower body clothing?: A Little 6 Click Score: 19    End of Session Equipment Utilized During Treatment: Rolling walker (2 wheels);Gait belt  OT Visit Diagnosis:  Dizziness and giddiness (R42)   Activity Tolerance Patient tolerated treatment well   Patient Left in chair;with call bell/phone within reach;with chair alarm set   Nurse Communication Mobility status        Time: 8588-5027 OT Time Calculation (min): 33 min  Charges: OT General Charges $OT Visit: 1 Visit OT Treatments $Self Care/Home Management : 23-37 mins  Jackelyn Poling OTR/L, MS Acute Rehabilitation Department Office# [phone] Pager# [phone]   Marcellina Millin 03/30/2021, 12:20 PM

## 2021-03-30 NOTE — Discharge Summary (Signed)
Physician Discharge Summary  Nathan [name] OIT:[id number] DOB: 02-20-1933 DOA: 03/27/2021  PCP: Donnajean Lopes, MD  Admit date: 03/27/2021 Discharge date: 03/30/2021  Admitted From: Home Disposition: Home  Recommendations for Outpatient Follow-up:  He has upcoming appointment with his PCP and urology later this week and nephrology next week recommend obtaining BMP/CBC and magnesium level by the end of the week Oral Keflex for 7 days Reduced metoprolol to 12.5 mg orally twice daily Cardiology to arrange for outpatient 30-day monitor  Home Health: PT/OT Equipment/Devices: None Discharge Condition: Stable CODE STATUS: Full code Diet recommendation: Heart healthy  Brief/Interim Summary:  88 yo CAD CABG, AI, MVR, for recurrent syncope. He has had 2 week Holter monitor without an events.  Patient appeared clinically dehydrated getting IV fluids during the hospitalization, urine cultures grew gram-negative rods.  Sensitivities are pending.  Cleared by cardiology with outpatient 30-day monitor.  Patient's metoprolol dose has been reduced to 12.5 mg.  Urine cultures eventually grew E. coli therefore transition to oral Keflex as it was pansensitive.  He was seen by PT who recommended home health.  Today patient is medically stable for discharge     Assessment and Plan:   Recurrent Syncope, improved -gently hydration, BB dose reduced, monitor urine output. Resume home medication.  Cardiology to arrange for outpatient 30-day monitor.  Continue gentle hydration.  PT/OT = HH PT   CAD s/p CABG with valvular disease.  Chronic systolic CHF ef 58% -Cont home Eliquis, and statin. No chest pain.    Essential HTN -Cont metoprolol- dose reduced 86.14m BID.  Plans for outpatient 30-day monitor per cardiology   CKD Stage IV -Cr around baseline of 2.0.  Has follow-up appointment in nephrology next week.   Recurrent Urinary retention with UTI -Urine cultures are growing gram-negative rods-E. coli  which is pansensitive.  Transition IV Rocephin to oral Keflex for 7 more days.  He does have outpatient follow-up with PCP and urology later this week and nephrology next week.    Body mass index is 21.77 kg/m.         Discharge Diagnoses:  Principal Problem:   Syncope Active Problems:   S/P CABG (coronary artery bypass graft)   Essential hypertension   Chronic combined systolic and diastolic CHF (congestive heart failure) (HCC)   Stage 4 chronic kidney disease (HCC)   UTI (urinary tract infection)      Consultations: None  Subjective: Feels great no complaints  Discharge Exam: Vitals:   03/29/21 2132 03/30/21 0640  BP: 134/76 (!) 175/76  Pulse: 100 79  Resp: 20 20  Temp: 98.8 F (37.1 C) 98 F (36.7 C)  SpO2: 93% 93%   Vitals:   03/29/21 1415 03/29/21 2025 03/29/21 2132 03/30/21 0640  BP: 129/73  134/76 (!) 175/76  Pulse: 81  100 79  Resp: _0 Temp: 98 F (36.7 C)  98.8 F (37.1 C) 98 F (36.7 C)  TempSrc: Oral  Oral Oral  SpO2: 94% 97% 93% 93%  Weight:      Height:        General: Pt is alert, awake, not in acute distress Cardiovascular: RRR, S1/S2 +, no rubs, no gallops Respiratory: CTA bilaterally, no wheezing, no rhonchi Abdominal: Soft, NT, ND, bowel sounds + Extremities: no edema, no cyanosis  Discharge Instructions   Allergies as of 03/30/2021       Reactions   Flomax [tamsulosin Hcl] Other (See Comments)   Dizzy   Lasix [furosemide]  Dizziness        Medication List     TAKE these medications    albuterol 108 (90 Base) MCG/ACT inhaler Commonly known as: VENTOLIN HFA Inhale 2 puffs into the lungs 2 (two) times daily.   apixaban 2.5 MG Tabs tablet Commonly known as: ELIQUIS Take 2.5 mg by mouth 2 (two) times daily.   CENTRUM SILVER PO Take 1 tablet by mouth daily.   cephALEXin 500 MG capsule Commonly known as: KEFLEX Take 1 capsule (500 mg total) by mouth 3 (three) times daily for 7 days.    cholecalciferol 25 MCG (1000 UNIT) tablet Commonly known as: VITAMIN D3 Take 1,000 Units by mouth daily.   feeding supplement Liqd Take 237 mLs by mouth 2 (two) times daily between meals.   fenofibrate 145 MG tablet Commonly known as: TRICOR TAKE 1 TABLET BY MOUTH EVERY DAY   finasteride 5 MG tablet Commonly known as: PROSCAR Take 5 mg by mouth daily.   insulin NPH-regular Human (70-30) 100 UNIT/ML injection Inject 10-25 Units into the skin See admin instructions. 25 units in the morning & 10 units at dinner Sliding Scale if BS>150   megestrol 20 MG tablet Commonly known as: MEGACE Take 20 mg by mouth daily.   metoprolol tartrate 25 MG tablet Commonly known as: LOPRESSOR Take 0.5 tablets (12.5 mg total) by mouth 2 (two) times daily. What changed: how much to take   mirtazapine 7.5 MG tablet Commonly known as: REMERON Take 1 tablet (7.5 mg total) by mouth at bedtime.   nitroGLYCERIN 0.4 MG SL tablet Commonly known as: NITROSTAT Place 1 tablet (0.4 mg total) under the tongue every 5 (five) minutes as needed for chest pain.   Normal Saline Flush 0.9 % Soln Flush once daily with 10 mls as directed   omeprazole 20 MG capsule Commonly known as: PRILOSEC Take 20 mg by mouth 2 (two) times daily.   ondansetron 4 MG disintegrating tablet Commonly known as: Zofran ODT Take 1 tablet (4 mg total) by mouth every 8 (eight) hours as needed for up to 15 doses for nausea or vomiting.   onetouch ultrasoft lancets   OneTouch Verio test strip Generic drug: glucose blood   OneTouch Verio w/Device Kit   OVER THE COUNTER MEDICATION Take 400 mg by mouth in the morning and at bedtime. [employer] Super Magnesium   RELION INSULIN SYRINGE 1ML/31G 31G X 5/16" 1 ML Misc Generic drug: Insulin Syringe-Needle U-100 USE 1 TWICE DAILY AS DIRECTED        Follow-up Information     Nahser, Wonda Cheng, MD Follow up.   Specialty: Cardiology Why: the office will call with appt date and time for  follow up,  if you have not heard in a week please call the office Contact information: [place] [address] [phone]         Donnajean Lopes, MD Follow up in 1 week(s).   Specialty: Internal Medicine Contact information: [place] [address] [phone]         Nahser, Wonda Cheng, MD .   Specialty: Cardiology Contact information: [place] [address] [place] [address] [phone]                Allergies  Allergen Reactions   Flomax [Tamsulosin Hcl] Other (See Comments)    Dizzy   Lasix [Furosemide]     Dizziness    You were cared for by a hospitalist during your  hospital stay. If you have any questions about your discharge medications or the care you received while you were in the hospital after you are discharged, you can call the unit and asked to speak with the hospitalist on call if the hospitalist that took care of you is not available. Once you are discharged, your primary care physician will handle any further medical issues. Please note that no refills for any discharge medications will be authorized once you are discharged, as it is imperative that you return to your primary care physician (or establish a relationship with a primary care physician if you do not have one) for your aftercare needs so that they can reassess your need for medications and monitor your lab values.   Procedures/Studies: CT ABDOMEN PELVIS WO CONTRAST  Result Date: 03/12/2021 CLINICAL DATA:  88 year old male with history of left-sided flank pain, nausea and difficulty voiding. Suspected bowel obstruction. EXAM: CT ABDOMEN AND PELVIS WITHOUT CONTRAST TECHNIQUE: Multidetector CT imaging of the abdomen and pelvis was performed following the standard protocol without IV contrast. RADIATION DOSE REDUCTION: This exam was performed according to the departmental dose-optimization program which includes automated exposure  control, adjustment of the mA and/or kV according to patient size and/or use of iterative reconstruction technique. COMPARISON:  CT the abdomen and pelvis 07/30/2020. FINDINGS: Lower chest: Atherosclerotic calcifications in the descending thoracic aorta as well as the left anterior descending, left circumflex and right coronary arteries. Calcifications of the aortic valve. Small hiatal hernia. Hepatobiliary: No definite suspicious cystic or solid hepatic lesions are confidently identified on today's noncontrast CT examination. Gallbladder is unremarkable in appearance. Pancreas: No definite pancreatic mass or peripancreatic fluid collections or inflammatory changes are noted on today's noncontrast CT examination. Spleen: Unremarkable. Adrenals/Urinary Tract: Mild bilateral renal atrophy. Mild left-sided hydroureteronephrosis and left perinephric and periureteric soft tissue stranding. The left hydroureteronephrosis appears to terminate in the mid left ureter. No calcified stone identified within the collecting system of either kidney, along the course of either ureter, or within the lumen of the urinary bladder. No right hydroureteronephrosis. Urinary bladder is moderately distended. Despite the urinary bladder distension, the wall appears mildly thickened with trabeculations. Unenhanced appearance of the adrenal glands is normal. Stomach/Bowel: Unenhanced appearance of the stomach is normal. No pathologic dilatation of small bowel or colon. Numerous colonic diverticulae are noted, without surrounding inflammatory changes to suggest an acute diverticulitis at this time. Normal appendix. Vascular/Lymphatic: Aortic atherosclerosis, without evidence of aneurysm in the abdominal vasculature. No lymphadenopathy noted in the abdomen or pelvis. Reproductive: Prostate gland is severely enlarged with median lobe hypertrophy, measuring 6.1 x 5.2 x 7.9 cm. Seminal vesicles are unremarkable in appearance. Other: No significant  volume of ascites.  No pneumoperitoneum. Musculoskeletal: There are no aggressive appearing lytic or blastic lesions noted in the visualized portions of the skeleton. Bilateral pars defects are noted at L5 with 8 mm of anterolisthesis of L5 upon S1. IMPRESSION: 1. Mild left-sided hydroureteronephrosis and left perinephric and periureteric soft tissue stranding. No obstructing stone is identified. The possibility of a recently passed stone, a left mid ureteral stricture, or left-sided upper tract urinary tract infection (i.e., pyelonephritis) Should be considered and further clinical evaluation is recommended. 2. Severe prostatomegaly with moderate distension of the urinary bladder which demonstrates a diffusely thickened and trabeculated bladder wall, indicating chronic bladder outlet obstruction. 3. Colonic diverticulosis without evidence of acute diverticulitis at this time. 4. Aortic atherosclerosis, in addition to at least 3 vessel coronary artery disease. 5. There are calcifications  of the aortic valve. Echocardiographic correlation for evaluation of potential valvular dysfunction may be warranted if clinically indicated. 6. Additional incidental findings, as above. Electronically Signed   By: Vinnie Langton M.D.   On: 03/12/2021 08:17   CT Head Wo Contrast  Result Date: 03/27/2021 CLINICAL DATA:  Head trauma, minor (Age >= 65y) EXAM: CT HEAD WITHOUT CONTRAST TECHNIQUE: Contiguous axial images were obtained from the base of the skull through the vertex without intravenous contrast. RADIATION DOSE REDUCTION: This exam was performed according to the departmental dose-optimization program which includes automated exposure control, adjustment of the mA and/or kV according to patient size and/or use of iterative reconstruction technique. COMPARISON:  None. FINDINGS: Brain: No evidence of acute intracranial hemorrhage. Prominent symmetric CSF space overlying the bilateral hemispheres likely related to cerebral  atrophy.No evidence of mass or concerning mass effect.The ventricular system is in proportion to degree of cerebral atrophy.Scattered subcortical and periventricular white matter hypodensities, nonspecific but likely sequela of chronic small vessel ischemic disease.Moderate cerebral atrophy Vascular: No hyperdense vessel or unexpected calcification. Skull: Prior right mastoidectomy.  Otherwise negative. Sinuses/Orbits: Minimal ethmoid air cell mucosal thickening. Orbits are unremarkable. Other: None. IMPRESSION: No acute intracranial abnormality. Chronic small vessel ischemic disease and cerebral atrophy. Electronically Signed   By: Maurine Simmering M.D.   On: 03/27/2021 16:39     The results of significant diagnostics from this hospitalization (including imaging, microbiology, ancillary and laboratory) are listed below for reference.     Microbiology: Recent Results (from the past 240 hour(s))  Urine Culture     Status: Abnormal   Collection Time: 03/27/21  2:22 PM   Specimen: Urine, Catheterized  Result Value Ref Range Status   Specimen Description   Final    URINE, CATHETERIZED Performed at [facility] [address], [place], [place] [address]    Special Requests   Final    NONE Performed at [facility], [place] [address], [place], [place] [address]    Culture (A)  Final    >=100,000 COLONIES/mL ESCHERICHIA COLI >=100,000 COLONIES/mL STAPHYLOCOCCUS AUREUS    Report Status 03/30/2021 FINAL  Final   Organism ID, Bacteria ESCHERICHIA COLI (A)  Final   Organism ID, Bacteria STAPHYLOCOCCUS AUREUS (A)  Final      Susceptibility   Escherichia coli - MIC*    AMPICILLIN >=32 RESISTANT Resistant     CEFAZOLIN <=4 SENSITIVE Sensitive     CEFEPIME <=0.12 SENSITIVE Sensitive     CEFTRIAXONE <=0.25 SENSITIVE Sensitive     CIPROFLOXACIN >=4 RESISTANT Resistant     GENTAMICIN <=1 SENSITIVE Sensitive     IMIPENEM <=0.25 SENSITIVE Sensitive     NITROFURANTOIN 32  SENSITIVE Sensitive     TRIMETH/SULFA >=320 RESISTANT Resistant     AMPICILLIN/SULBACTAM 16 INTERMEDIATE Intermediate     PIP/TAZO <=4 SENSITIVE Sensitive     * >=100,000 COLONIES/mL ESCHERICHIA COLI   Staphylococcus aureus - MIC*    CIPROFLOXACIN <=0.5 SENSITIVE Sensitive     GENTAMICIN <=0.5 SENSITIVE Sensitive     NITROFURANTOIN <=16 SENSITIVE Sensitive     OXACILLIN 0.5 SENSITIVE Sensitive     TETRACYCLINE >=16 RESISTANT Resistant     VANCOMYCIN <=0.5 SENSITIVE Sensitive     TRIMETH/SULFA <=10 SENSITIVE Sensitive     CLINDAMYCIN <=0.25 SENSITIVE Sensitive     RIFAMPIN <=0.5 SENSITIVE Sensitive     Inducible Clindamycin NEGATIVE Sensitive     * >=100,000 COLONIES/mL STAPHYLOCOCCUS AUREUS  Resp Panel by RT-PCR (Flu A&B, Covid) Nasopharyngeal Swab  Status: None   Collection Time: 03/27/21  7:44 PM   Specimen: Nasopharyngeal Swab; Nasopharyngeal(NP) swabs in vial transport medium  Result Value Ref Range Status   SARS Coronavirus 2 by RT PCR NEGATIVE NEGATIVE Final    Comment: (NOTE) SARS-CoV-2 target nucleic acids are NOT DETECTED.  The SARS-CoV-2 RNA is generally detectable in upper respiratory specimens during the acute phase of infection. The lowest concentration of SARS-CoV-2 viral copies this assay can detect is 138 copies/mL. A negative result does not preclude SARS-Cov-2 infection and should not be used as the sole basis for treatment or other patient management decisions. A negative result may occur with  improper specimen collection/handling, submission of specimen other than nasopharyngeal swab, presence of viral mutation(s) within the areas targeted by this assay, and inadequate number of viral copies(<138 copies/mL). A negative result must be combined with clinical observations, patient history, and epidemiological information. The expected result is Negative.  Fact Sheet for Patients:  EntrepreneurPulse.com.au  Fact Sheet for Healthcare  Providers:  IncredibleEmployment.be  This test is no t yet approved or cleared by the [place] FDA and  has been authorized for detection and/or diagnosis of SARS-CoV-2 by FDA under an Emergency Use Authorization (EUA). This EUA will remain  in effect (meaning this test can be used) for the duration of the COVID-19 declaration under Section 564(b)(1) of the Act, 21 U.S.C.section 360bbb-3(b)(1), unless the authorization is terminated  or revoked sooner.       Influenza A by PCR NEGATIVE NEGATIVE Final   Influenza B by PCR NEGATIVE NEGATIVE Final    Comment: (NOTE) The Xpert Xpress SARS-CoV-2/FLU/RSV plus assay is intended as an aid in the diagnosis of influenza from Nasopharyngeal swab specimens and should not be used as a sole basis for treatment. Nasal washings and aspirates are unacceptable for Xpert Xpress SARS-CoV-2/FLU/RSV testing.  Fact Sheet for Patients: EntrepreneurPulse.com.au  Fact Sheet for Healthcare Providers: IncredibleEmployment.be  This test is not yet approved or cleared by the [place] FDA and has been authorized for detection and/or diagnosis of SARS-CoV-2 by FDA under an Emergency Use Authorization (EUA). This EUA will remain in effect (meaning this test can be used) for the duration of the COVID-19 declaration under Section 564(b)(1) of the Act, 21 U.S.C. section 360bbb-3(b)(1), unless the authorization is terminated or revoked.  Performed at [facility], [place] [address] [place], [place] [address]      Labs: BNP (last 3 results) No results for input(s): BNP in the last 8760 hours. Basic Metabolic Panel: Recent Labs  Lab 03/27/21 1253 03/28/21 0435 03/30/21 0430  NA 133* 135 134*  K 4.6 4.0 3.9  CL 104 108 108  CO2 22 21* 19*  GLUCOSE 300* 181* 298*  BUN 37* 34* 34*  CREATININE 2.34* 1.89* 2.20*  CALCIUM 9.5 9.3 8.7*  MG 2.0  --  1.7   Liver Function  Tests: Recent Labs  Lab 03/27/21 1253  AST 35  ALT 22  ALKPHOS 54  BILITOT 0.4  PROT 7.0  ALBUMIN 3.2*   No results for input(s): LIPASE, AMYLASE in the last 168 hours. No results for input(s): AMMONIA in the last 168 hours. CBC: Recent Labs  Lab 03/27/21 1253 03/28/21 0435 03/30/21 0430  WBC 11.2* 8.4 8.9  NEUTROABS 6.2  --   --   HGB 13.2 12.4* 11.6*  HCT 40.0 37.0* 34.5*  MCV 93.9 91.8 92.5  PLT 316 278 264   Cardiac Enzymes: No results for input(s): CKTOTAL, CKMB, CKMBINDEX,  TROPONINI in the last 168 hours. BNP: Invalid input(s): POCBNP CBG: Recent Labs  Lab 03/29/21 0802 03/29/21 1202  GLUCAP 147* 209*   D-Dimer No results for input(s): DDIMER in the last 72 hours. Hgb A1c No results for input(s): HGBA1C in the last 72 hours. Lipid Profile No results for input(s): CHOL, HDL, LDLCALC, TRIG, CHOLHDL, LDLDIRECT in the last 72 hours. Thyroid function studies No results for input(s): TSH, T4TOTAL, T3FREE, THYROIDAB in the last 72 hours.  Invalid input(s): FREET3 Anemia work up No results for input(s): VITAMINB12, FOLATE, FERRITIN, TIBC, IRON, RETICCTPCT in the last 72 hours. Urinalysis    Component Value Date/Time   COLORURINE AMBER (A) 03/27/2021 1422   APPEARANCEUR CLOUDY (A) 03/27/2021 1422   LABSPEC 1.017 03/27/2021 1422   PHURINE 5.0 03/27/2021 1422   GLUCOSEU >=500 (A) 03/27/2021 1422   HGBUR LARGE (A) 03/27/2021 1422   BILIRUBINUR NEGATIVE 03/27/2021 1422   KETONESUR NEGATIVE 03/27/2021 1422   PROTEINUR 100 (A) 03/27/2021 1422   NITRITE NEGATIVE 03/27/2021 1422   LEUKOCYTESUR LARGE (A) 03/27/2021 1422   Sepsis Labs Invalid input(s): PROCALCITONIN,  WBC,  LACTICIDVEN Microbiology Recent Results (from the past 240 hour(s))  Urine Culture     Status: Abnormal   Collection Time: 03/27/21  2:22 PM   Specimen: Urine, Catheterized  Result Value Ref Range Status   Specimen Description   Final    URINE, CATHETERIZED Performed at [facility], [place] [address], [place], [place] [address]    Special Requests   Final    NONE Performed at [facility], [place] [address] [place], [place] [address]    Culture (A)  Final    >=100,000 COLONIES/mL ESCHERICHIA COLI >=100,000 COLONIES/mL STAPHYLOCOCCUS AUREUS    Report Status 03/30/2021 FINAL  Final   Organism ID, Bacteria ESCHERICHIA COLI (A)  Final   Organism ID, Bacteria STAPHYLOCOCCUS AUREUS (A)  Final      Susceptibility   Escherichia coli - MIC*    AMPICILLIN >=32 RESISTANT Resistant     CEFAZOLIN <=4 SENSITIVE Sensitive     CEFEPIME <=0.12 SENSITIVE Sensitive     CEFTRIAXONE <=0.25 SENSITIVE Sensitive     CIPROFLOXACIN >=4 RESISTANT Resistant     GENTAMICIN <=1 SENSITIVE Sensitive     IMIPENEM <=0.25 SENSITIVE Sensitive     NITROFURANTOIN 32 SENSITIVE Sensitive     TRIMETH/SULFA >=320 RESISTANT Resistant     AMPICILLIN/SULBACTAM 16 INTERMEDIATE Intermediate     PIP/TAZO <=4 SENSITIVE Sensitive     * >=100,000 COLONIES/mL ESCHERICHIA COLI   Staphylococcus aureus - MIC*    CIPROFLOXACIN <=0.5 SENSITIVE Sensitive     GENTAMICIN <=0.5 SENSITIVE Sensitive     NITROFURANTOIN <=16 SENSITIVE Sensitive     OXACILLIN 0.5 SENSITIVE Sensitive     TETRACYCLINE >=16 RESISTANT Resistant     VANCOMYCIN <=0.5 SENSITIVE Sensitive     TRIMETH/SULFA <=10 SENSITIVE Sensitive     CLINDAMYCIN <=0.25 SENSITIVE Sensitive     RIFAMPIN <=0.5 SENSITIVE Sensitive     Inducible Clindamycin NEGATIVE Sensitive     * >=100,000 COLONIES/mL STAPHYLOCOCCUS AUREUS  Resp Panel by RT-PCR (Flu A&B, Covid) Nasopharyngeal Swab     Status: None   Collection Time: 03/27/21  7:44 PM   Specimen: Nasopharyngeal Swab; Nasopharyngeal(NP) swabs in vial transport medium  Result Value Ref Range Status   SARS Coronavirus 2 by RT PCR NEGATIVE NEGATIVE Final    Comment: (NOTE) SARS-CoV-2 target nucleic acids are NOT DETECTED.  The SARS-CoV-2 RNA is generally detectable  in upper  respiratory specimens during the acute phase of infection. The lowest concentration of SARS-CoV-2 viral copies this assay can detect is 138 copies/mL. A negative result does not preclude SARS-Cov-2 infection and should not be used as the sole basis for treatment or other patient management decisions. A negative result may occur with  improper specimen collection/handling, submission of specimen other than nasopharyngeal swab, presence of viral mutation(s) within the areas targeted by this assay, and inadequate number of viral copies(<138 copies/mL). A negative result must be combined with clinical observations, patient history, and epidemiological information. The expected result is Negative.  Fact Sheet for Patients:  EntrepreneurPulse.com.au  Fact Sheet for Healthcare Providers:  IncredibleEmployment.be  This test is no t yet approved or cleared by the [place] FDA and  has been authorized for detection and/or diagnosis of SARS-CoV-2 by FDA under an Emergency Use Authorization (EUA). This EUA will remain  in effect (meaning this test can be used) for the duration of the COVID-19 declaration under Section 564(b)(1) of the Act, 21 U.S.C.section 360bbb-3(b)(1), unless the authorization is terminated  or revoked sooner.       Influenza A by PCR NEGATIVE NEGATIVE Final   Influenza B by PCR NEGATIVE NEGATIVE Final    Comment: (NOTE) The Xpert Xpress SARS-CoV-2/FLU/RSV plus assay is intended as an aid in the diagnosis of influenza from Nasopharyngeal swab specimens and should not be used as a sole basis for treatment. Nasal washings and aspirates are unacceptable for Xpert Xpress SARS-CoV-2/FLU/RSV testing.  Fact Sheet for Patients: EntrepreneurPulse.com.au  Fact Sheet for Healthcare Providers: IncredibleEmployment.be  This test is not yet approved or cleared by the [place] FDA and has been  authorized for detection and/or diagnosis of SARS-CoV-2 by FDA under an Emergency Use Authorization (EUA). This EUA will remain in effect (meaning this test can be used) for the duration of the COVID-19 declaration under Section 564(b)(1) of the Act, 21 U.S.C. section 360bbb-3(b)(1), unless the authorization is terminated or revoked.  Performed at [facility], [place] [address], [place], [place] [address]      Time coordinating discharge:  I have spent 35 minutes face to face with the patient and on the ward discussing the patients care, assessment, plan and disposition with other care givers. >50% of the time was devoted counseling the patient about the risks and benefits of treatment/Discharge disposition and coordinating care.   SIGNED:   Damita Lack, MD  [facility] 03/30/2021, 11:34 AM   If 7PM-7AM, please contact night-coverage

## 2021-03-30 NOTE — TOC Initial Note (Signed)
Transition of Care [facility]) - Initial/Assessment Note    Patient Details  Name: Nathan [name] MRN: [id number] Date of Birth: [birth year]  Transition of Care [facility]) CM/SW Contact:    Leeroy Cha, RN Phone Number: 03/30/2021, 8:35 AM  Clinical Narrative:                  Transition of Care [facility]) Screening Note   Patient Details  Name: Nathan [name] Date of Birth: 01/10/1934   Transition of Care [facility]) CM/SW Contact:    Leeroy Cha, RN Phone Number: 03/30/2021, 8:35 AM    Transition of Care Department [facility]) has reviewed patient and no TOC needs have been identified at this time. We will continue to monitor patient advancement through interdisciplinary progression rounds. If new patient transition needs arise, please place a TOC consult.    Expected Discharge Plan: Home/Self Care Barriers to Discharge: Continued Medical Work up   Patient Goals and CMS Choice Patient states their goals for this hospitalization and ongoing recovery are:: to go home CMS Medicare.gov Compare Post Acute Care list provided to:: Patient Choice offered to / list presented to : Patient  Expected Discharge Plan and Services Expected Discharge Plan: Home/Self Care   Discharge Planning Services: CM Consult   Living arrangements for the past 2 months: Single Family Home                                      Prior Living Arrangements/Services Living arrangements for the past 2 months: Single Family Home Lives with:: Self Patient language and need for interpreter reviewed:: Yes Do you feel safe going back to the place where you live?: Yes            Criminal Activity/Legal Involvement Pertinent to Current Situation/Hospitalization: No - Comment as needed  Activities of Daily Living Home Assistive Devices/Equipment: Dentures (specify type), Walker (specify type), Eyeglasses ADL Screening (condition at time of admission) Patient's cognitive ability adequate to safely complete  daily activities?: Yes Is the patient deaf or have difficulty hearing?: No Does the patient have difficulty seeing, even when wearing glasses/contacts?: No Does the patient have difficulty concentrating, remembering, or making decisions?: No Patient able to express need for assistance with ADLs?: Yes Does the patient have difficulty dressing or bathing?: Yes Independently performs ADLs?: Yes (appropriate for developmental age) Does the patient have difficulty walking or climbing stairs?: Yes Weakness of Legs: Both Weakness of Arms/Hands: None  Permission Sought/Granted                  Emotional Assessment Appearance:: Appears stated age     Orientation: : Oriented to Self, Oriented to Place, Oriented to  Time, Oriented to Situation Alcohol / Substance Use: Not Applicable Psych Involvement: No (comment)  Admission diagnosis:  Syncope [R55] Urinary tract infection associated with indwelling urethral catheter, initial encounter ([place]) [E56.314H, N39.0] UTI (urinary tract infection) [N39.0] Patient Active Problem List   Diagnosis Date Noted   UTI (urinary tract infection) 03/29/2021   Non-ST elevation (NSTEMI) myocardial infarction ([place]) 05/13/2020   Protein-calorie malnutrition, severe 05/10/2020   Abdominal pain 05/01/2020   Calculus of gallbladder with biliary obstruction but without cholecystitis    Elevated LFTs    Aortic stenosis 03/26/2019   Chronic venous insufficiency 12/12/2018   Pain due to onychomycosis of toenails of both feet 08/11/2018   Coagulation disorder ([place]) 08/11/2018   Chronic combined systolic and  diastolic CHF (congestive heart failure) ([place]) 05/11/2018   Stage 4 chronic kidney disease ([place]) 05/11/2018   Essential hypertension 11/11/2016   Pneumonia 01/23/2015   Hypoxia 01/22/2015   Elevated troponin 01/22/2015   Acute cholecystitis 01/21/2015   S/P CABG (coronary artery bypass graft) 01/21/2015   Diabetes mellitus type 2 with complications ([place])  36/14/4315   Sepsis ([place])    Valvular heart disease 09/29/2011   Syncope 01/21/2011   Coronary artery disease    Aortic insufficiency    Hypercholesteremia    Mitral valve regurgitation    PCP:  Donnajean Lopes, MD Pharmacy:   [place], [place] [address] [place] [address] Phone: [phone] Fax: [phone]  [id number] - [place], [place] - [address] [place] [address] [place] [address] Phone: [phone] Fax: [phone]  [id number] - [place], [place] - [address] [place] [address] Phone: [phone] Fax: [phone]     Social Determinants of Health ([place]) Interventions    Readmission Risk Interventions No flowsheet data found.

## 2021-03-30 NOTE — Plan of Care (Signed)
  Problem: Clinical Measurements: Goal: Ability to maintain clinical measurements within normal limits will improve Outcome: Progressing Goal: Respiratory complications will improve Outcome: Progressing   

## 2021-03-31 DIAGNOSIS — W19XXXD Unspecified fall, subsequent encounter: Secondary | ICD-10-CM | POA: Diagnosis not present

## 2021-03-31 DIAGNOSIS — R42 Dizziness and giddiness: Secondary | ICD-10-CM | POA: Diagnosis not present

## 2021-03-31 DIAGNOSIS — S51811D Laceration without foreign body of right forearm, subsequent encounter: Secondary | ICD-10-CM | POA: Diagnosis not present

## 2021-03-31 DIAGNOSIS — N390 Urinary tract infection, site not specified: Secondary | ICD-10-CM | POA: Diagnosis not present

## 2021-03-31 DIAGNOSIS — R55 Syncope and collapse: Secondary | ICD-10-CM | POA: Diagnosis not present

## 2021-04-01 DIAGNOSIS — N184 Chronic kidney disease, stage 4 (severe): Secondary | ICD-10-CM | POA: Diagnosis not present

## 2021-04-01 DIAGNOSIS — C44319 Basal cell carcinoma of skin of other parts of face: Secondary | ICD-10-CM | POA: Diagnosis not present

## 2021-04-01 DIAGNOSIS — S51811A Laceration without foreign body of right forearm, initial encounter: Secondary | ICD-10-CM | POA: Diagnosis not present

## 2021-04-02 DIAGNOSIS — I350 Nonrheumatic aortic (valve) stenosis: Secondary | ICD-10-CM | POA: Diagnosis not present

## 2021-04-02 DIAGNOSIS — I5042 Chronic combined systolic (congestive) and diastolic (congestive) heart failure: Secondary | ICD-10-CM | POA: Diagnosis not present

## 2021-04-02 DIAGNOSIS — I351 Nonrheumatic aortic (valve) insufficiency: Secondary | ICD-10-CM | POA: Diagnosis not present

## 2021-04-02 DIAGNOSIS — I25709 Atherosclerosis of coronary artery bypass graft(s), unspecified, with unspecified angina pectoris: Secondary | ICD-10-CM | POA: Diagnosis not present

## 2021-04-02 DIAGNOSIS — N184 Chronic kidney disease, stage 4 (severe): Secondary | ICD-10-CM | POA: Diagnosis not present

## 2021-04-02 DIAGNOSIS — E1121 Type 2 diabetes mellitus with diabetic nephropathy: Secondary | ICD-10-CM | POA: Diagnosis not present

## 2021-04-02 DIAGNOSIS — E440 Moderate protein-calorie malnutrition: Secondary | ICD-10-CM | POA: Diagnosis not present

## 2021-04-02 DIAGNOSIS — I951 Orthostatic hypotension: Secondary | ICD-10-CM | POA: Diagnosis not present

## 2021-04-02 DIAGNOSIS — Z794 Long term (current) use of insulin: Secondary | ICD-10-CM | POA: Diagnosis not present

## 2021-04-02 DIAGNOSIS — I700 Atherosclerosis of aorta: Secondary | ICD-10-CM | POA: Diagnosis not present

## 2021-04-02 DIAGNOSIS — R338 Other retention of urine: Secondary | ICD-10-CM | POA: Diagnosis not present

## 2021-04-06 DIAGNOSIS — N400 Enlarged prostate without lower urinary tract symptoms: Secondary | ICD-10-CM | POA: Diagnosis not present

## 2021-04-06 DIAGNOSIS — E872 Acidosis, unspecified: Secondary | ICD-10-CM | POA: Diagnosis not present

## 2021-04-06 DIAGNOSIS — I5042 Chronic combined systolic (congestive) and diastolic (congestive) heart failure: Secondary | ICD-10-CM | POA: Diagnosis not present

## 2021-04-06 DIAGNOSIS — R809 Proteinuria, unspecified: Secondary | ICD-10-CM | POA: Diagnosis not present

## 2021-04-06 DIAGNOSIS — E1122 Type 2 diabetes mellitus with diabetic chronic kidney disease: Secondary | ICD-10-CM | POA: Diagnosis not present

## 2021-04-06 DIAGNOSIS — I129 Hypertensive chronic kidney disease with stage 1 through stage 4 chronic kidney disease, or unspecified chronic kidney disease: Secondary | ICD-10-CM | POA: Diagnosis not present

## 2021-04-06 DIAGNOSIS — N184 Chronic kidney disease, stage 4 (severe): Secondary | ICD-10-CM | POA: Diagnosis not present

## 2021-04-06 DIAGNOSIS — R55 Syncope and collapse: Secondary | ICD-10-CM | POA: Diagnosis not present

## 2021-04-07 ENCOUNTER — Ambulatory Visit (INDEPENDENT_AMBULATORY_CARE_PROVIDER_SITE_OTHER): Payer: PPO

## 2021-04-07 ENCOUNTER — Ambulatory Visit: Payer: PPO

## 2021-04-07 DIAGNOSIS — R55 Syncope and collapse: Secondary | ICD-10-CM | POA: Diagnosis not present

## 2021-04-07 DIAGNOSIS — R42 Dizziness and giddiness: Secondary | ICD-10-CM | POA: Diagnosis not present

## 2021-04-08 ENCOUNTER — Telehealth: Payer: Self-pay | Admitting: Cardiovascular Disease

## 2021-04-08 NOTE — Telephone Encounter (Signed)
Talked [name] through removing monitor and strip and applying a new monitor and strip. Monitor was turned on and phone reading good skin contact. Contacted [employer] via email and requested 4 more strips and saline wipes to be sent to the patient.

## 2021-04-08 NOTE — Telephone Encounter (Signed)
° °  Pt's daughter calling, she said pt wearing his heart monitor but it keep saying it has poor skin contact, she already changed the adhesive and still having same error

## 2021-04-09 ENCOUNTER — Telehealth: Payer: Self-pay | Admitting: *Deleted

## 2021-04-09 ENCOUNTER — Other Ambulatory Visit: Payer: Self-pay

## 2021-04-09 NOTE — Progress Notes (Unsigned)
[id number] applied 04/09/21 from office inventory replacing [id number] which was applied by patients nurse 04/07/21 and kept malfunctioning. [id number] shipped back to [employer] 04/09/21.

## 2021-04-09 NOTE — Telephone Encounter (Signed)
Patients [employer] monitor serial # [id number] keeps malfunctioning (continuously reading poor skin contact).  Monitor and electrode strip has been changed 3 times since 04/07/21.  Patient coming into office to switch monitors to serial # [id number].  [employer] representative, Intake, Inventory and customer service notified.

## 2021-04-19 ENCOUNTER — Telehealth: Payer: Self-pay | Admitting: Physician Assistant

## 2021-04-19 NOTE — Telephone Encounter (Signed)
Paged by [employer] regarding possible syncopal episode that was a manual trigger event.  [employer] apparently tried to reach out to the patient and he did not pick up the phone.  I have called the patient, he denies any passing out spells this morning.  He did have a little bit dizziness earlier.  The manual trigger event was a false alarm.  We will continue to monitor.  He has a history of orthostatic hypotension.  I have informed the [employer] regarding the false alarm. ?

## 2021-04-21 ENCOUNTER — Telehealth: Payer: Self-pay

## 2021-04-21 NOTE — Telephone Encounter (Signed)
? ?  Cardiac Monitor Alert ? ?Date of alert:  04/21/2021  ? ?Patient Name: Nathan [name]  ?DOB: 1933-06-16  ?MRN: [id number]  ? ?[facility] Cardiologist: [name], MD  ?[facility] EP:  None   ? ?Monitor Information: ?Cardiac Event Monitor [[employer]]  ?Reason:  Syncope and collapse ?Ordering provider:  Nahser ?  ?Alert ?Atrial Fibrillation/Flutter ?This is the 1st alert for this rhythm.  ?The patient has no hx of Atrial Fibrillation/Flutter.  ?The patient is currently on anticoagulation. ?Anticoagulation medication as of 04/21/2021   ? ?    ?  ? apixaban (ELIQUIS) 2.5 MG TABS tablet Take 2.5 mg by mouth 2 (two) times daily.  ? ?  ? ? ?Next Cardiology Appointment   ?Date: 05/04/2021 Provider:  Christen Bame ? ?The patient was contacted today.  He was not symptomatic at time of event yesterday 04/20/2021.  He reports the following symptoms:  today he is having episodes of dizziness  - no monitor alerts received yet for today.[name] ?Arrhythmia, symptoms and history reviewed with Dr Acie Fredrickson.  Plan:  Continue monitoring as pt is anticoagulated   ? ? ? ?Thora Lance, RN  ?04/21/2021 10:55 AM   ?

## 2021-04-25 ENCOUNTER — Telehealth: Payer: Self-pay | Admitting: Medical

## 2021-04-25 NOTE — Telephone Encounter (Signed)
? ?  Cardiac Monitor Alert ? ?Date of alert:  04/25/2021  ? ?Patient Name: Nathan [name]  ?DOB: 06-01-1933  ?MRN: [id number]  ? ?[facility] Cardiologist: [name], MD  ?[facility] EP:  None   ? ?Monitor Information: ?Cardiac Event Monitor [[employer]]  ?Reason:  Syncope ?Ordering provider:  Dr. Acie Fredrickson ? ?Other: ?Notified by [employer] about a possible syncopal episode. Patient has pressed the alert button several times in the past 2 weeks for dizzy spells but has not had frank syncope while wearing the monitor that I can tell. He was reported to have been in sinus tachycardia with rate 116 bpm with PACs at the time of the alert. [employer] attempted to contact the patient but was unsuccessful. I have called several times with no answer. VM left on patients phone and [name], his emergency contact on file, in hopes that she is able to reach him. I have left a second voicemail with the patient instructing him to reach out. If no response will have EMS do a courtesy check.  ? ?Abigail Butts, PA-C  ?04/25/2021 1:56 PM  ? ? ? ?

## 2021-04-25 NOTE — Telephone Encounter (Signed)
? ?  Addendum: ? ?Was able to get in touch with the patient.  He reports having episodes of dizziness this morning but no syncopal episodes.  He states he has not had any syncope in the last 3 weeks.  He had no chest pain, shortness of breath, palpitations at the time.  Educated him on importance of answering his phone whenever he pushes the alert button.  May benefit from further education on how to track symptoms.  Will route to the monitor team to see if they can talk with him on Monday given several false alarms in the past week. ? ?Abigail Butts, PA-C ?04/25/21; 5:26 PM ? ?

## 2021-04-27 ENCOUNTER — Telehealth: Payer: Self-pay

## 2021-04-27 MED ORDER — METOPROLOL TARTRATE 25 MG PO TABS: 25.0000 mg | ORAL_TABLET | Freq: Two times a day (BID) | ORAL | 1 refills | Status: DC

## 2021-04-27 NOTE — Telephone Encounter (Signed)
? ?  Cardiac Monitor Alert ? ?Date of alert:  04/27/2021  ? ?Patient Name: Nathan [name]  ?DOB: 11-29-1933  ?MRN: [id number]  ? ?[facility] Cardiologist: [name], MD  ?[facility] EP:  None   ? ?Monitor Information: ?Cardiac Event Monitor [[employer]]  ?Reason:  Syncope and Collapse ?Ordering provider:  Cecilie Kicks, NP ?  ?Alert ?Supraventricular Tachycardia - fastest HR:  187 bpm ?This is the 1st alert for this rhythm.  ? ?Next Cardiology Appointment   ?Date:  05/04/21  Provider:  Christen Bame, NP ? ?The patient was contacted today.  He is symptomatic.  He reports the following symptoms:  Dizziness. ?Arrhythmia, symptoms and history reviewed with Dr. Acie Fredrickson.   ?Plan:  Increase metoprolol to 25 mg BID. Postpone appt with Christen Bame until after monitor complete.   ? ?Other: ?Called and spoke to patient regarding monitor alert of SVT (35.5 sec) on 04/25/21 at 10:20 AM (CST). Patient states that he had some dizziness. Denies syncope. Patient taking metoprolol 12.5 mg BID. Per Dr. Acie Fredrickson we will increase metoprolol to 25 mg BID and postpone appt with Sharyn Lull until after monitor complete. Patient made aware of recommendations. Rx updated at preferred pharmacy. Appt with Christen Bame, NP changed to 4/3. ? ?Cleon Gustin, RN  ?04/27/2021 10:10 AM  ? ?

## 2021-05-01 DIAGNOSIS — R338 Other retention of urine: Secondary | ICD-10-CM | POA: Diagnosis not present

## 2021-05-04 ENCOUNTER — Ambulatory Visit: Payer: PPO | Admitting: Nurse Practitioner

## 2021-05-06 DIAGNOSIS — Z515 Encounter for palliative care: Secondary | ICD-10-CM | POA: Diagnosis not present

## 2021-05-06 DIAGNOSIS — R42 Dizziness and giddiness: Secondary | ICD-10-CM | POA: Diagnosis not present

## 2021-05-06 DIAGNOSIS — Z960 Presence of urogenital implants: Secondary | ICD-10-CM | POA: Diagnosis not present

## 2021-05-06 DIAGNOSIS — N401 Enlarged prostate with lower urinary tract symptoms: Secondary | ICD-10-CM | POA: Diagnosis not present

## 2021-05-06 DIAGNOSIS — Z87828 Personal history of other (healed) physical injury and trauma: Secondary | ICD-10-CM | POA: Diagnosis not present

## 2021-05-06 DIAGNOSIS — Z9181 History of falling: Secondary | ICD-10-CM | POA: Diagnosis not present

## 2021-05-14 NOTE — Progress Notes (Signed)
?Cardiology Office Note:   ? ?Date:  05/18/2021  ? ?ID:  Nathan [name], DOB 06-Jul-1933, MRN [id number] ? ?PCP:  Donnajean Lopes, MD ?  ?[facility] Providers ?Cardiologist:  [name], MD    ? ?Referring MD: Donnajean Lopes, MD  ? ?Chief Complaint: hospital follow-up syncope, cardiac monitor ? ?History of Present Illness:   ? ?Nathan [name] is a 88 y.o. male with a hx of CAD,s/p CABG x 5 2002, aortic valve insufficiency and aortic stenosis, atrial fibrillation on chronic anticoagulation, severe TR, chronic combined CHF, chronic venous insufficiency, mitral valve regurgitation, and hypertension. ? ?Admission March 2022 for gram-negative sepsis with acute hypoxic respiratory failure, peak hs troponin 5012 likely due to demand ischemia, echo 05/02/20 which revealed mildly reduced LVEF 40-45%, unable to determine diastolic function, mildly elevated pulmonary artery pressures, severe TR, degenerative MV with mild to moderate mitral stenosis and mild AI. Found to have gallstones, told he was not operative candidate. Dr. Acie Fredrickson advised conservative management of CAD and CHF at office visit on 05/13/20.  ? ?Cardiac monitor 12/30/20 revealed episodes of atrial flutter with a bundle branch block and he was started on metoprolol. He is also on Eliquis at a reduced dose. ED visit 03/12/21 for constipation and vomiting. Significant urinary retention > 900 cc. Foley cath was placed and he was noted to have severe prostatomegaly and advised to follow-up with urology. He then presented to ED on 2/10.  ? ?Admission 2/10-2/13/23 for cardiology consult for syncope. He reported multiple episodes of syncope over the prior year with concern for orthostasis. Antihypertensives were held and Lasix and tamsulosin were stopped with initial improvement. Cardiology was consulted. He described worsening of symptoms of dizziness after having Foley placed for urinary retention.  He had not been wearing his compression socks.  All episodes of  syncope occurred while standing, none while sitting and episodes were not preceded by chest pain, shortness of breath, nausea, diaphoresis, or palpitations.  Symptoms initially improved after BP meds were held and when wearing compression socks. 30 day outpatient monitor was ordered. Metoprolol dose was reduced ? ?Today, he is here with his daughter for follow-up. He reports he is feeling well most days.  After hospital discharge, had 3 weeks of feeling very well until 2 days ago when he experienced dizziness, no presyncope.  Felt a little dizzy this morning as well . He does not monitor home BP, admits he may not drink enough water (2-4 cups per day). Lives at home alone, remains mostly independent. His daughter helps him frequently. Says he does more sitting than previously and he does not feel comfortable doing yard work.  Beta-blocker dosing has varied in the setting of A-fib/flutter and orthostasis.  He was advised to be on metoprolol tartrate 12.5 mg twice daily at hospital discharge 03/28/21.  Dose increased to 25 mg twice daily on 04/27/2021 due to SVT seen on monitor. Does not feel that increased dose of metoprolol has contributed to dizziness. Overall, he is starting to feel more fatigued and has occasional shortness of breath. He denies chest pain, lower extremity edema, palpitations, melena, hematuria, hemoptysis, diaphoresis, presyncope, syncope, orthopnea, and PND. ? ? ?Past Medical History:  ?Diagnosis Date  ? Acid reflux   ? Aortic insufficiency   ? Cholecystitis   ? Combined systolic and diastolic heart failure ([place])   ? Echo 04/2018: inf-lat HK, EF 40-45, severe basal septal hypertrophy, mild conc LVH, Gr 1 DD, severe LAE, mild to mod TR, mod AI, asc  Aorta 39 mm  ? Coronary artery disease 2002  ? CABG x5  ? Diabetes mellitus   ? DVT (deep venous thrombosis) ([place])   ? previously on coumadin  ? Hypercholesteremia   ? Hyperlipidemia   ? Mitral valve regurgitation   ? Skin cancer   ? tumor removed off of  his back  ? ? ?Past Surgical History:  ?Procedure Laterality Date  ? BILIARY DRAINAGE CATHETER PLACEMENT W/ BILE DUCT TUBE CHANGE  01/2015  ? CORONARY ARTERY BYPASS GRAFT  12/21/2000  ? x5  ? IR CATHETER TUBE CHANGE  06/18/2020  ? IR CHOLANGIOGRAM EXISTING TUBE  12/08/2020  ? IR CHOLANGIOGRAM EXISTING TUBE  12/16/2020  ? IR EXCHANGE BILIARY DRAIN  06/18/2020  ? IR EXCHANGE BILIARY DRAIN  08/13/2020  ? IR EXCHANGE BILIARY DRAIN  10/21/2020  ? IR EXCHANGE BILIARY DRAIN  11/20/2020  ? IR PERC CHOLECYSTOSTOMY  05/02/2020  ? IR RADIOLOGIST EVAL & MGMT  10/24/2020  ? IR RADIOLOGIST EVAL & MGMT  12/02/2020  ? IR REMOVAL OF CALCULI/DEBRIS BILIARY DUCT/GB  11/20/2020  ? IR US GUIDANCE  05/02/2020  ? LEFT HEART CATH AND CORS/GRAFTS ANGIOGRAPHY N/A 04/06/2018  ? Procedure: LEFT HEART CATH AND CORS/GRAFTS ANGIOGRAPHY;  Surgeon: Lorretta Harp, MD;  Location: [facility];  Service: Cardiovascular;  Laterality: N/A;  ? SKIN CANCER EXCISION    ? TUMOR REMOVAL    ? off of back, skin cancer  ? ? ?Current Medications: ?Current Meds  ?Medication Sig  ? albuterol (VENTOLIN HFA) 108 (90 Base) MCG/ACT inhaler Inhale 2 puffs into the lungs 2 (two) times daily.  ? apixaban (ELIQUIS) 2.5 MG TABS tablet Take 2.5 mg by mouth 2 (two) times daily.  ? Blood Glucose Monitoring Suppl (ONETOUCH VERIO) w/Device KIT   ? cholecalciferol (VITAMIN D3) 25 MCG (1000 UNIT) tablet Take 1,000 Units by mouth daily.  ? feeding supplement (ENSURE ENLIVE / ENSURE PLUS) LIQD Take 237 mLs by mouth 2 (two) times daily between meals.  ? fenofibrate (TRICOR) 145 MG tablet TAKE 1 TABLET BY MOUTH EVERY DAY  ? finasteride (PROSCAR) 5 MG tablet Take 5 mg by mouth daily.  ? insulin NPH-regular Human (NOVOLIN 70/30) (70-30) 100 UNIT/ML injection Inject 10-25 Units into the skin See admin instructions. 25 units in the morning & 10 units at dinner Sliding Scale if BS>150  ? Lancets (ONETOUCH ULTRASOFT) lancets   ? magnesium oxide (MAG-OX) 400 MG tablet Take 1 tablet by mouth  daily.  ? megestrol (MEGACE) 20 MG tablet Take 20 mg by mouth daily.  ? metoprolol tartrate (LOPRESSOR) 25 MG tablet Take 1 tablet (25 mg total) by mouth 2 (two) times daily.  ? Multiple Vitamins-Minerals (CENTRUM SILVER PO) Take 1 tablet by mouth daily.  ? nitroGLYCERIN (NITROSTAT) 0.4 MG SL tablet Place 1 tablet (0.4 mg total) under the tongue every 5 (five) minutes as needed for chest pain.  ? omeprazole (PRILOSEC) 20 MG capsule Take 20 mg by mouth 2 (two) times daily.   ? ONETOUCH VERIO test strip   ? OVER THE COUNTER MEDICATION Take 400 mg by mouth in the morning and at bedtime. [employer] Super Magnesium  ? RELION INSULIN SYRINGE 1ML/31G 31G X 5/16" 1 ML MISC USE 1 TWICE DAILY AS DIRECTED  ? sodium bicarbonate 650 MG tablet Take 650 mg by mouth 2 (two) times daily.  ?  ? ?Allergies:   Flomax [tamsulosin hcl] and Lasix [furosemide]  ? ?Social History  ? ?Socioeconomic History  ? Marital  status: Divorced  ?  Spouse name: Not on file  ? Number of children: Not on file  ? Years of education: Not on file  ? Highest education level: Not on file  ?Occupational History  ? Not on file  ?Tobacco Use  ? Smoking status: Never  ? Smokeless tobacco: Never  ?Vaping Use  ? Vaping Use: Never used  ?Substance and Sexual Activity  ? Alcohol use: No  ? Drug use: No  ? Sexual activity: Not Currently  ?Other Topics Concern  ? Not on file  ?Social History Narrative  ? Not on file  ? ?Social Determinants of Health  ? ?Financial Resource Strain: Not on file  ?Food Insecurity: Not on file  ?Transportation Needs: Not on file  ?Physical Activity: Not on file  ?Stress: Not on file  ?Social Connections: Not on file  ?  ? ?Family History: ?The patient's family history includes Cancer in his brother and sister; Diabetes in his brother; Heart attack in his father; Hypertension in his mother. ? ?ROS:   ?Please see the history of present illness.    ?+ dizziness ?All other systems reviewed and are negative. ? ?Labs/Other Studies Reviewed:   ? ?The  following studies were reviewed today: ? ?CT Head 03/27/21 ? ?No acute intracranial abnormality. Chronic small vessel ischemic ?disease and cerebral atrophy. ?  ? ?Echo 05/02/20 ? ?Left Ventricle: Left ventric

## 2021-05-18 ENCOUNTER — Ambulatory Visit: Payer: PPO | Admitting: Nurse Practitioner

## 2021-05-18 ENCOUNTER — Encounter: Payer: Self-pay | Admitting: Nurse Practitioner

## 2021-05-18 VITALS — BP 118/72 | HR 77 | Ht 71.0 in | Wt 165.0 lb

## 2021-05-18 DIAGNOSIS — I2510 Atherosclerotic heart disease of native coronary artery without angina pectoris: Secondary | ICD-10-CM

## 2021-05-18 DIAGNOSIS — R55 Syncope and collapse: Secondary | ICD-10-CM | POA: Diagnosis not present

## 2021-05-18 DIAGNOSIS — N184 Chronic kidney disease, stage 4 (severe): Secondary | ICD-10-CM

## 2021-05-18 NOTE — Patient Instructions (Signed)
Medication Instructions:  ? ?Your physician recommends that you continue on your current medications as directed. Please refer to the Current Medication list given to you today. ? ? ?*If you need a refill on your cardiac medications before your next appointment, please call your pharmacy* ? ?Follow-Up: ?At [facility], you and your health needs are our priority.  As part of our continuing mission to provide you with exceptional heart care, we have created designated Provider Care Teams.  These Care Teams include your primary Cardiologist (physician) and Advanced Practice Providers (APPs -  Physician Assistants and Nurse Practitioners) who all work together to provide you with the care you need, when you need it. ? ?We recommend signing up for the patient portal called "MyChart".  Sign up information is provided on this After Visit Summary.  MyChart is used to connect with patients for Virtual Visits (Telemedicine).  Patients are able to view lab/test results, encounter notes, upcoming appointments, etc.  Non-urgent messages can be sent to your provider as well.   ?To learn more about what you can do with MyChart, go to [email or web address].   ? ?Your next appointment:   ?2 month(s) ? ?The format for your next appointment:   ?In Person ? ?Provider:   ?[name], MD   ? ? ? ?

## 2021-05-20 ENCOUNTER — Other Ambulatory Visit: Payer: Self-pay | Admitting: *Deleted

## 2021-05-20 DIAGNOSIS — R55 Syncope and collapse: Secondary | ICD-10-CM

## 2021-05-21 ENCOUNTER — Other Ambulatory Visit (HOSPITAL_COMMUNITY): Payer: Self-pay | Admitting: Cardiovascular Disease

## 2021-06-08 DIAGNOSIS — N401 Enlarged prostate with lower urinary tract symptoms: Secondary | ICD-10-CM | POA: Diagnosis not present

## 2021-06-08 DIAGNOSIS — C61 Malignant neoplasm of prostate: Secondary | ICD-10-CM | POA: Diagnosis not present

## 2021-06-08 DIAGNOSIS — Z87440 Personal history of urinary (tract) infections: Secondary | ICD-10-CM | POA: Diagnosis not present

## 2021-06-08 DIAGNOSIS — R338 Other retention of urine: Secondary | ICD-10-CM | POA: Diagnosis not present

## 2021-06-19 ENCOUNTER — Ambulatory Visit: Payer: PPO | Admitting: Podiatry

## 2021-06-19 ENCOUNTER — Encounter: Payer: Self-pay | Admitting: Podiatry

## 2021-06-19 DIAGNOSIS — M2041 Other hammer toe(s) (acquired), right foot: Secondary | ICD-10-CM | POA: Diagnosis not present

## 2021-06-19 DIAGNOSIS — D689 Coagulation defect, unspecified: Secondary | ICD-10-CM

## 2021-06-19 DIAGNOSIS — N184 Chronic kidney disease, stage 4 (severe): Secondary | ICD-10-CM | POA: Diagnosis not present

## 2021-06-19 DIAGNOSIS — I872 Venous insufficiency (chronic) (peripheral): Secondary | ICD-10-CM | POA: Diagnosis not present

## 2021-06-19 DIAGNOSIS — M79675 Pain in left toe(s): Secondary | ICD-10-CM | POA: Diagnosis not present

## 2021-06-19 DIAGNOSIS — B351 Tinea unguium: Secondary | ICD-10-CM | POA: Diagnosis not present

## 2021-06-19 DIAGNOSIS — M2010 Hallux valgus (acquired), unspecified foot: Secondary | ICD-10-CM

## 2021-06-19 DIAGNOSIS — M2042 Other hammer toe(s) (acquired), left foot: Secondary | ICD-10-CM

## 2021-06-19 DIAGNOSIS — E118 Type 2 diabetes mellitus with unspecified complications: Secondary | ICD-10-CM

## 2021-06-19 DIAGNOSIS — M79674 Pain in right toe(s): Secondary | ICD-10-CM | POA: Diagnosis not present

## 2021-06-19 NOTE — Progress Notes (Signed)
This patient returns to my office for at risk foot care.  This patient requires this care by a professional since this patient will be at risk due to having diabetes type 2, CKD and chronic insufficiensy.  Patient is taking eliquiss.  This patient is unable to cut nails himself since the patient cannot reach his nails.These nails are painful walking and wearing shoes.  This patient presents for at risk foot care today. ? ?General Appearance  Alert, conversant and in no acute stress. ? ?Vascular  Dorsalis pedis and posterior tibial  pulses are palpable  bilaterally.  Capillary return is within normal limits  bilaterally. Temperature is within normal limits  bilaterally. ? ?Neurologic  Senn-Weinstein monofilament wire test diminished   bilaterally. Muscle power within normal limits bilaterally. ? ?Nails Thick disfigured discolored nails with subungual debris  from hallux to fifth toes bilaterally. No evidence of bacterial infection or drainage bilaterally. ? ?Orthopedic  No limitations of motion  feet .  No crepitus or effusions noted.  Hammer toes second  B/L.  MCJ DJD  B/L. HAV  B/L. ? ?Skin  normotropic skin with no porokeratosis noted bilaterally.  No signs of infections or ulcers noted.  Asymptomatic callus 1st MPJ left foot.  ? ?Onychomycosis  Pain in right toes  Pain in left toes ? ?Consent was obtained for treatment procedures.   Mechanical debridement of nails 1-5  bilaterally performed with a nail nipper.  Filed with dremel without incident.  Patient request diabetic shoes.  Patient qualifies for diabetic shoes due to DPN ,  HAV  and hammer toes. ? ? ?Return office visit  3 months                   Told patient to return for periodic foot care and evaluation due to potential at risk complications. ? ? ?Gardiner Barefoot DPM  ?

## 2021-06-23 ENCOUNTER — Ambulatory Visit: Payer: PPO

## 2021-06-23 DIAGNOSIS — E118 Type 2 diabetes mellitus with unspecified complications: Secondary | ICD-10-CM

## 2021-06-23 DIAGNOSIS — M2010 Hallux valgus (acquired), unspecified foot: Secondary | ICD-10-CM

## 2021-06-23 DIAGNOSIS — M2041 Other hammer toe(s) (acquired), right foot: Secondary | ICD-10-CM

## 2021-06-23 NOTE — Progress Notes (Signed)
SITUATION ?Reason for Consult: Evaluation for Prefabricated Diabetic Shoes and Custom Diabetic Inserts. ?Patient / Caregiver Report: Patient would like well fitting shoes ? ?OBJECTIVE DATA: ?Patient History / Diagnosis:  ?  ICD-10-CM   ?1. Diabetes mellitus type 2 with complications (HCC)  X64.6   ?  ?2. Hammer toes of both feet  M20.41   ? M20.42   ?  ?3. Hav (hallux abducto valgus), unspecified laterality  M20.10   ?  ? ? ?Physician Treating Diabetes:  Juliette Mangle ? ?Current or Previous Devices:   Current user ? ?In-Person Foot Examination: ?Ulcers & Callousing:   Historical ?Deformities:    Hammertoes, hallux valgus ?Sensation:    Compromised  ?Shoe Size:     11.79M ? ?ORTHOTIC RECOMMENDATION ?Recommended Devices: ?- 1x pair prefabricated PDAC approved diabetic shoes; Patient Selected Apex X821M Size 11.79M ?- 3x pair custom-to-patient PDAC approved vacuum formed diabetic insoles. ? ?GOALS OF SHOES AND INSOLES ?- Reduce shear and pressure ?- Reduce / Prevent callus formation ?- Reduce / Prevent ulceration ?- Protect the fragile healing compromised diabetic foot. ? ?Patient would benefit from diabetic shoes and inserts as patient has diabetes mellitus and the patient has one or more of the following conditions: ?- History of partial or complete amputation of the foot ?- History of previous foot ulceration. ?- History of pre-ulcerative callus ?- Peripheral neuropathy with evidence of callus formation ?- Foot deformity ?- Poor circulation ? ?ACTIONS PERFORMED ?Potential out of pocket cost was communicated to patient. Patient understood and consented to measurement and casting. Patient was casted for insoles via crush box and measured for shoes via brannock device. Procedure was explained and patient tolerated procedure well. All questions were answered and concerns addressed. Casts were shipped to central fabrication for HOLD until Certificate of Medical Necessity or otherwise necessary authorization from  insurance is obtained. ? ?PLAN ?Shoes are to be ordered and casts released from hold once all appropriate paperwork is complete. Patient is to be contacted and scheduled for fitting once shoes and insoles have been fabricated and received. ? ?

## 2021-06-24 ENCOUNTER — Other Ambulatory Visit: Payer: PPO

## 2021-06-26 ENCOUNTER — Ambulatory Visit: Payer: PPO | Admitting: Podiatry

## 2021-06-26 DIAGNOSIS — R3914 Feeling of incomplete bladder emptying: Secondary | ICD-10-CM | POA: Diagnosis not present

## 2021-06-30 DIAGNOSIS — N184 Chronic kidney disease, stage 4 (severe): Secondary | ICD-10-CM | POA: Diagnosis not present

## 2021-06-30 DIAGNOSIS — C44622 Squamous cell carcinoma of skin of right upper limb, including shoulder: Secondary | ICD-10-CM | POA: Diagnosis not present

## 2021-06-30 DIAGNOSIS — C4441 Basal cell carcinoma of skin of scalp and neck: Secondary | ICD-10-CM | POA: Diagnosis not present

## 2021-06-30 DIAGNOSIS — C44319 Basal cell carcinoma of skin of other parts of face: Secondary | ICD-10-CM | POA: Diagnosis not present

## 2021-06-30 DIAGNOSIS — C44619 Basal cell carcinoma of skin of left upper limb, including shoulder: Secondary | ICD-10-CM | POA: Diagnosis not present

## 2021-06-30 DIAGNOSIS — D485 Neoplasm of uncertain behavior of skin: Secondary | ICD-10-CM | POA: Diagnosis not present

## 2021-07-02 DIAGNOSIS — Z515 Encounter for palliative care: Secondary | ICD-10-CM | POA: Diagnosis not present

## 2021-07-02 DIAGNOSIS — E119 Type 2 diabetes mellitus without complications: Secondary | ICD-10-CM | POA: Diagnosis not present

## 2021-07-02 DIAGNOSIS — Z794 Long term (current) use of insulin: Secondary | ICD-10-CM | POA: Diagnosis not present

## 2021-07-06 DIAGNOSIS — E440 Moderate protein-calorie malnutrition: Secondary | ICD-10-CM | POA: Diagnosis not present

## 2021-07-06 DIAGNOSIS — I5042 Chronic combined systolic (congestive) and diastolic (congestive) heart failure: Secondary | ICD-10-CM | POA: Diagnosis not present

## 2021-07-06 DIAGNOSIS — R55 Syncope and collapse: Secondary | ICD-10-CM | POA: Diagnosis not present

## 2021-07-06 DIAGNOSIS — E1121 Type 2 diabetes mellitus with diabetic nephropathy: Secondary | ICD-10-CM | POA: Diagnosis not present

## 2021-07-06 DIAGNOSIS — I25810 Atherosclerosis of coronary artery bypass graft(s) without angina pectoris: Secondary | ICD-10-CM | POA: Diagnosis not present

## 2021-07-06 DIAGNOSIS — N320 Bladder-neck obstruction: Secondary | ICD-10-CM | POA: Diagnosis not present

## 2021-07-06 DIAGNOSIS — I700 Atherosclerosis of aorta: Secondary | ICD-10-CM | POA: Diagnosis not present

## 2021-07-06 DIAGNOSIS — I130 Hypertensive heart and chronic kidney disease with heart failure and stage 1 through stage 4 chronic kidney disease, or unspecified chronic kidney disease: Secondary | ICD-10-CM | POA: Diagnosis not present

## 2021-07-06 DIAGNOSIS — N184 Chronic kidney disease, stage 4 (severe): Secondary | ICD-10-CM | POA: Diagnosis not present

## 2021-07-06 DIAGNOSIS — Z794 Long term (current) use of insulin: Secondary | ICD-10-CM | POA: Diagnosis not present

## 2021-07-07 DIAGNOSIS — N184 Chronic kidney disease, stage 4 (severe): Secondary | ICD-10-CM | POA: Diagnosis not present

## 2021-07-07 DIAGNOSIS — E559 Vitamin D deficiency, unspecified: Secondary | ICD-10-CM | POA: Diagnosis not present

## 2021-07-07 DIAGNOSIS — N400 Enlarged prostate without lower urinary tract symptoms: Secondary | ICD-10-CM | POA: Diagnosis not present

## 2021-07-07 DIAGNOSIS — I5042 Chronic combined systolic (congestive) and diastolic (congestive) heart failure: Secondary | ICD-10-CM | POA: Diagnosis not present

## 2021-07-07 DIAGNOSIS — R809 Proteinuria, unspecified: Secondary | ICD-10-CM | POA: Diagnosis not present

## 2021-07-07 DIAGNOSIS — E1122 Type 2 diabetes mellitus with diabetic chronic kidney disease: Secondary | ICD-10-CM | POA: Diagnosis not present

## 2021-07-07 DIAGNOSIS — I129 Hypertensive chronic kidney disease with stage 1 through stage 4 chronic kidney disease, or unspecified chronic kidney disease: Secondary | ICD-10-CM | POA: Diagnosis not present

## 2021-07-07 DIAGNOSIS — R55 Syncope and collapse: Secondary | ICD-10-CM | POA: Diagnosis not present

## 2021-07-07 DIAGNOSIS — E872 Acidosis, unspecified: Secondary | ICD-10-CM | POA: Diagnosis not present

## 2021-07-30 DIAGNOSIS — R338 Other retention of urine: Secondary | ICD-10-CM | POA: Diagnosis not present

## 2021-08-02 ENCOUNTER — Encounter: Payer: Self-pay | Admitting: Cardiovascular Disease

## 2021-08-02 NOTE — Progress Notes (Unsigned)
Nathan [name] Date of Birth  1933-12-19       [place]. [address], [place]    [place], [place]  [address]     [phone]       Fax  [phone]       Problem List: 1. Coronary artery disease-status post CABG 2. Aortic insufficiency 3. Hypercholesterolemia 4. Diabetes mellitus 5. Mitral regurgitation     Nathan [name] is doing very well. He's not had any episodes of chest pain or shortness of breath. 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  Dec. 2, 2014:  Nathan [name] is doing well.  Not as much energy.   No CP or dyspnea.    Dec. 3,2015:  Doing well from a cardiac standpoint.  started taking insulin recently.   Worked again for [name].   Dec. 2, 2016:  Doing well from a cardiac standpoint.   BP is high today , was normal yesterday   Feb. 14, 2017:   Nathan [name] is seen back for a visit BP is a bit elevated today .   Eats lots of salty foods ( soup for dinner) He had acute cholecystitis and had a perc drain placed.  The drain has been removed.  He never had his GB removed.  Was placed on home O2 during the hospitalization  And has been on home O2 since He does not think he needs it.   He occasionally will forget to turn it on and cannot tell the difference.  Will check his O2 sate on RA. ( time off oxygen  1440)   During his hospitalization, he had an echocardiogram that reveals normal left ventricle systolic function. He did have mild pulmonary hypertension. He had a stress Myoview study which revealed normal left ventricular systolic function and revealed no ischemia.  Aug. 17, 2017:  Doing well. Had the gall bladder percutaneous drain removed. Did not have GB removed.  Stands on his feet all day , has some leg edema   Feb. 13, 2018:  Doing well .   No further gall bladder issues  Sept.  26, 2018:  Nathan [name] is seen today for follow-up of his coronary artery  disease. Also  has a history of essential hypertension.  May 31, 2017 Doing well . Strawberries  Are ready - hes still working  No CP  No further gall bladder issues  August 29, 2017:  Nathan [name] is seen today  Has been having lots of orthostasis  Lightheadness. Has not been working - works at [name] - in [employer] building  Has been having lots of GI issues ( belching and GERD after eating ) , thinks he is having gall bladder issues.  Appetite has not been good  Labs from [name] office show a creatinine of 2.3  Had 4 of his pills stopped last week  Taking fish oil, amlodipine, ASA and potassium ( by mistake, was supposed to stop)  , MVI  Still eating salty meats   Oct. 21, 2019:  Nathan [name] is seen today  Has not had a good appetite for the past several months  Has a left leg DVT - Is now on Eliquis  orthostasis has improved    September 22, 2018:  Doing well.   No CP , no dyspnea.   BP is a little elevated.   March 26, 2019:  Nathan [name] is seen today for follow-up of his coronary artery disease and aortic insufficiency.  He also has a history of hyperlipidemia. Has retired from [employer].  No CP, Has some dyspnea if he works too quickly .   Feb. 4, 2022: Nathan [name] is seen today for follow up of his CAD, AI, HLD Has had some dizziness , especially when standing  He has occasional episodes of chest heaviness./ dyspnea He may have worseining CAD but we are limited in our ability to do a cath because of his CKD - stage 4.   May 13, 2020: Nathan [name] is seen today for follow up of his CAD, AI,HLD He was in the hospital recently for gram-negative sepsis with acute hypoxic respiratory failure He had + troponins likely due to demand ischemia Peak troponin was 5012  Has CKD  Baseline creatinine is between 2-3.  Just got out of hospital yesterday  WBC is still elevated.   Echocardiogram from May 02, 2020 shows mildly reduced left ventricular systolic function with an  ejection fraction of 40 to 45%.  We were unable to determine diastolic function.  There is mildly elevated pulmonary artery pressures. Mitral valve is degenerative with mild to moderate mitral stenosis. Mild AI  He has severe tricuspid regurgitation.  Was found to have gallstones.  Has a drainage tube in gall bladder   He was told that he was not an operative candidate due to excessive risk.  BP is low today.  He is on Toprol-XL 25 mg a day, isosorbide 30 mg a day, hydralazine 10 mg 3 times a day.  Will DC hydranlazine   Jun 25, 2020: Nathan [name] is seen for follow up of hospitalization for gram negative bacteremia, NSTEMI,  No further CP ,  Breathing is ok .  Still has a poor appetitie    His O2 sat has remained > 90 %.     Nov. 15, 2022 Is having some dizziness Has some dizziness when he pulls his compression hose up  ? Orthostasis Will DC Imdur and see if helps  He stopped the Tamulosin and furosemide last week after he fell .   His dizziness did not improve much   We discussed electrolyte replacement in his water.  Nuun Liquid IV  Protein shakes.   Echo from March, 2022 shows EF 40-45%.  Mild - mod Pulmonary HTN - estimated PA pressure of 40   August 03, 2021 Nathan [name] is seen today for follow up of his CAD, atrial fib  Has had syncope - thought to be due to orthostasis Event monitor showed sinus rhythm ( including tachy and brady) Frequent episodes of SVT up to 179. Occasional episodes of atrial fib with RVR Rare episodes of nonsustanied VT     Current Outpatient Medications on File Prior to Visit  Medication Sig Dispense Refill   albuterol (VENTOLIN HFA) 108 (90 Base) MCG/ACT inhaler Inhale 2 puffs into the lungs 2 (two) times daily.     apixaban (ELIQUIS) 2.5 MG TABS tablet Take 2.5 mg by mouth 2 (two) times daily.     Blood Glucose Monitoring Suppl (ONETOUCH VERIO) w/Device KIT      cholecalciferol (VITAMIN D3) 25 MCG (1000 UNIT) tablet Take 1,000 Units by mouth  daily.     feeding supplement (ENSURE ENLIVE / ENSURE PLUS) LIQD Take 237 mLs by mouth 2 (two) times daily between meals. 237 mL 12   fenofibrate (TRICOR) 145 MG tablet TAKE 1 TABLET BY MOUTH EVERY DAY 90 tablet 3  finasteride (PROSCAR) 5 MG tablet Take 5 mg by mouth daily.     insulin NPH-regular Human (NOVOLIN 70/30) (70-30) 100 UNIT/ML injection Inject 10-25 Units into the skin See admin instructions. 25 units in the morning & 10 units at dinner Sliding Scale if BS>150     Lancets (ONETOUCH ULTRASOFT) lancets      magnesium oxide (MAG-OX) 400 MG tablet Take 1 tablet by mouth daily.     megestrol (MEGACE) 20 MG tablet Take 20 mg by mouth daily.     metoprolol tartrate (LOPRESSOR) 25 MG tablet Take 1 tablet (25 mg total) by mouth 2 (two) times daily. 60 tablet 1   Multiple Vitamins-Minerals (CENTRUM SILVER PO) Take 1 tablet by mouth daily.     nitroGLYCERIN (NITROSTAT) 0.4 MG SL tablet Place 1 tablet (0.4 mg total) under the tongue every 5 (five) minutes as needed for chest pain. 25 tablet 5   omeprazole (PRILOSEC) 20 MG capsule Take 20 mg by mouth 2 (two) times daily.      ONETOUCH VERIO test strip      OVER THE COUNTER MEDICATION Take 400 mg by mouth in the morning and at bedtime. [employer] Super Magnesium     RELION INSULIN SYRINGE 1ML/31G 31G X 5/16" 1 ML MISC USE 1 TWICE DAILY AS DIRECTED  6   sodium bicarbonate 650 MG tablet Take 650 mg by mouth 2 (two) times daily.     No current facility-administered medications on file prior to visit.    Allergies  Allergen Reactions   Flomax [Tamsulosin Hcl] Other (See Comments)    Dizzy   Lasix [Furosemide]     Dizziness    Past Medical History:  Diagnosis Date   Acid reflux    Aortic insufficiency    Cholecystitis    Combined systolic and diastolic heart failure (HCC)    Echo 04/2018: inf-lat HK, EF 40-45, severe basal septal hypertrophy, mild conc LVH, Gr 1 DD, severe LAE, mild to mod TR, mod AI, asc Aorta 39 mm   Coronary artery disease  2002   CABG x5   Diabetes mellitus    DVT (deep venous thrombosis) ([place])    previously on coumadin   Hypercholesteremia    Hyperlipidemia    Mitral valve regurgitation    Skin cancer    tumor removed off of his back    Past Surgical History:  Procedure Laterality Date   BILIARY DRAINAGE CATHETER PLACEMENT W/ BILE DUCT TUBE CHANGE  01/2015   CORONARY ARTERY BYPASS GRAFT  12/21/2000   x5   IR CATHETER TUBE CHANGE  06/18/2020   IR CHOLANGIOGRAM EXISTING TUBE  12/08/2020   IR CHOLANGIOGRAM EXISTING TUBE  12/16/2020   IR EXCHANGE BILIARY DRAIN  06/18/2020   IR EXCHANGE BILIARY DRAIN  08/13/2020   IR EXCHANGE BILIARY DRAIN  10/21/2020   IR EXCHANGE BILIARY DRAIN  11/20/2020   IR PERC CHOLECYSTOSTOMY  05/02/2020   IR RADIOLOGIST EVAL & MGMT  10/24/2020   IR RADIOLOGIST EVAL & MGMT  12/02/2020   IR REMOVAL OF CALCULI/DEBRIS BILIARY DUCT/GB  11/20/2020   IR US GUIDANCE  05/02/2020   LEFT HEART CATH AND CORS/GRAFTS ANGIOGRAPHY N/A 04/06/2018   Procedure: LEFT HEART CATH AND CORS/GRAFTS ANGIOGRAPHY;  Surgeon: Lorretta Harp, MD;  Location: [facility];  Service: Cardiovascular;  Laterality: N/A;   SKIN CANCER EXCISION     TUMOR REMOVAL     off of back, skin cancer    Social History   Tobacco Use  Smoking Status Never  Smokeless Tobacco Never    Social History   Substance and Sexual Activity  Alcohol Use No    Family History  Problem Relation Age of Onset   Heart attack Father    Hypertension Mother    Diabetes Brother    Cancer Sister        breast   Cancer Brother        throat & mouth    Reviw of Systems:  Noted in current history, otherwise review of systems is negative.  Physical Exam: There were no vitals taken for this visit.  GEN:  Well nourished, well developed in no acute distress HEENT: Normal NECK: No JVD; No carotid bruits LYMPHATICS: No lymphadenopathy CARDIAC: RRR ***, no murmurs, rubs, gallops RESPIRATORY:  Clear to auscultation without rales,  wheezing or rhonchi  ABDOMEN: Soft, non-tender, non-distended MUSCULOSKELETAL:  No edema; No deformity  SKIN: Warm and dry NEUROLOGIC:  Alert and oriented x 3     ECG:        Assessment / Plan:   1. Coronary artery disease-     no angina    2.  Chronic combined systolic / diastolic CHF:        3.  Left leg DVT :  -   4.  Syncope:   ? May be orthostasis.    [name], MD  08/02/2021 5:33 PM    [place],  [place],   [phone] Pager [phone] Phone: [phone]; Fax: [phone]

## 2021-08-03 ENCOUNTER — Ambulatory Visit: Payer: PPO | Admitting: Cardiovascular Disease

## 2021-08-03 ENCOUNTER — Encounter: Payer: Self-pay | Admitting: Cardiovascular Disease

## 2021-08-03 VITALS — BP 132/70 | HR 88 | Ht 71.0 in | Wt 165.4 lb

## 2021-08-03 DIAGNOSIS — I5042 Chronic combined systolic (congestive) and diastolic (congestive) heart failure: Secondary | ICD-10-CM

## 2021-08-03 DIAGNOSIS — I2510 Atherosclerotic heart disease of native coronary artery without angina pectoris: Secondary | ICD-10-CM | POA: Diagnosis not present

## 2021-08-03 DIAGNOSIS — I480 Paroxysmal atrial fibrillation: Secondary | ICD-10-CM | POA: Diagnosis not present

## 2021-08-03 NOTE — Patient Instructions (Signed)
Medication Instructions:  Your physician recommends that you continue on your current medications as directed. Please refer to the Current Medication list given to you today.  *If you need a refill on your cardiac medications before your next appointment, please call your pharmacy*   Lab Work: NONE If you have labs (blood work) drawn today and your tests are completely normal, you will receive your results only by: [place] (if you have MyChart) OR A paper copy in the mail If you have any lab test that is abnormal or we need to change your treatment, we will call you to review the results.   Testing/Procedures: NONE   Follow-Up: At [facility], you and your health needs are our priority.  As part of our continuing mission to provide you with exceptional heart care, we have created designated Provider Care Teams.  These Care Teams include your primary Cardiologist (physician) and Advanced Practice Providers (APPs -  Physician Assistants and Nurse Practitioners) who all work together to provide you with the care you need, when you need it.  Your next appointment:   6 month(s)  The format for your next appointment:   In Person  Provider:   [name], MD {   Important Information About Sugar

## 2021-08-13 ENCOUNTER — Encounter: Payer: Self-pay | Admitting: Podiatry

## 2021-08-13 DIAGNOSIS — Z08 Encounter for follow-up examination after completed treatment for malignant neoplasm: Secondary | ICD-10-CM | POA: Diagnosis not present

## 2021-08-13 DIAGNOSIS — C44619 Basal cell carcinoma of skin of left upper limb, including shoulder: Secondary | ICD-10-CM | POA: Diagnosis not present

## 2021-08-13 DIAGNOSIS — Z85828 Personal history of other malignant neoplasm of skin: Secondary | ICD-10-CM | POA: Diagnosis not present

## 2021-08-13 DIAGNOSIS — D0461 Carcinoma in situ of skin of right upper limb, including shoulder: Secondary | ICD-10-CM | POA: Diagnosis not present

## 2021-08-20 DIAGNOSIS — J40 Bronchitis, not specified as acute or chronic: Secondary | ICD-10-CM | POA: Diagnosis not present

## 2021-08-20 DIAGNOSIS — R058 Other specified cough: Secondary | ICD-10-CM | POA: Diagnosis not present

## 2021-09-10 DIAGNOSIS — I8390 Asymptomatic varicose veins of unspecified lower extremity: Secondary | ICD-10-CM | POA: Diagnosis not present

## 2021-09-10 DIAGNOSIS — D692 Other nonthrombocytopenic purpura: Secondary | ICD-10-CM | POA: Diagnosis not present

## 2021-09-10 DIAGNOSIS — G250 Essential tremor: Secondary | ICD-10-CM | POA: Diagnosis not present

## 2021-09-10 DIAGNOSIS — I739 Peripheral vascular disease, unspecified: Secondary | ICD-10-CM | POA: Diagnosis not present

## 2021-09-18 ENCOUNTER — Encounter: Payer: Self-pay | Admitting: Podiatry

## 2021-09-18 ENCOUNTER — Ambulatory Visit: Payer: PPO | Admitting: Podiatry

## 2021-09-18 DIAGNOSIS — B351 Tinea unguium: Secondary | ICD-10-CM | POA: Diagnosis not present

## 2021-09-18 DIAGNOSIS — M79674 Pain in right toe(s): Secondary | ICD-10-CM

## 2021-09-18 DIAGNOSIS — D689 Coagulation defect, unspecified: Secondary | ICD-10-CM | POA: Diagnosis not present

## 2021-09-18 DIAGNOSIS — N184 Chronic kidney disease, stage 4 (severe): Secondary | ICD-10-CM

## 2021-09-18 DIAGNOSIS — E118 Type 2 diabetes mellitus with unspecified complications: Secondary | ICD-10-CM

## 2021-09-18 DIAGNOSIS — M79675 Pain in left toe(s): Secondary | ICD-10-CM

## 2021-09-18 NOTE — Progress Notes (Signed)
This patient returns to my office for at risk foot care.  This patient requires this care by a professional since this patient will be at risk due to having diabetes type 2, CKD and chronic insufficiensy.  Patient is taking eliquiss.  This patient is unable to cut nails himself since the patient cannot reach his nails.These nails are painful walking and wearing shoes.  This patient presents for at risk foot care today.  General Appearance  Alert, conversant and in no acute stress.  Vascular  Dorsalis pedis and posterior tibial  pulses are palpable  bilaterally.  Capillary return is within normal limits  bilaterally. Temperature is within normal limits  bilaterally.  Neurologic  Senn-Weinstein monofilament wire test diminished   bilaterally. Muscle power within normal limits bilaterally.  Nails Thick disfigured discolored nails with subungual debris  from hallux to fifth toes bilaterally. No evidence of bacterial infection or drainage bilaterally.  Orthopedic  No limitations of motion  feet .  No crepitus or effusions noted.  Hammer toes second  B/L.  MCJ DJD  B/L. HAV  B/L.  Skin  normotropic skin with no porokeratosis noted bilaterally.  No signs of infections or ulcers noted.  Asymptomatic callus 1st MPJ left foot.   Onychomycosis  Pain in right toes  Pain in left toes  Consent was obtained for treatment procedures.   Mechanical debridement of nails 1-5  bilaterally performed with a nail nipper.  Filed with dremel without incident.  Patient waiting on his diabetic shoes.   Return office visit  3 months                   Told patient to return for periodic foot care and evaluation due to potential at risk complications.   Remmy Crass DPM  

## 2021-09-25 DIAGNOSIS — R338 Other retention of urine: Secondary | ICD-10-CM | POA: Diagnosis not present

## 2021-09-25 DIAGNOSIS — R310 Gross hematuria: Secondary | ICD-10-CM | POA: Diagnosis not present

## 2021-09-25 DIAGNOSIS — N401 Enlarged prostate with lower urinary tract symptoms: Secondary | ICD-10-CM | POA: Diagnosis not present

## 2021-09-28 ENCOUNTER — Emergency Department (HOSPITAL_BASED_OUTPATIENT_CLINIC_OR_DEPARTMENT_OTHER): Payer: PPO

## 2021-09-28 ENCOUNTER — Other Ambulatory Visit: Payer: Self-pay

## 2021-09-28 ENCOUNTER — Encounter (HOSPITAL_COMMUNITY): Payer: Self-pay

## 2021-09-28 ENCOUNTER — Inpatient Hospital Stay (HOSPITAL_BASED_OUTPATIENT_CLINIC_OR_DEPARTMENT_OTHER)
Admission: EM | Admit: 2021-09-28 | Discharge: 2021-10-05 | DRG: 246 | Disposition: A | Discharge status: Home / Self Care | Payer: PPO | Attending: Internal Medicine | Admitting: Internal Medicine

## 2021-09-28 ENCOUNTER — Encounter (HOSPITAL_BASED_OUTPATIENT_CLINIC_OR_DEPARTMENT_OTHER): Payer: Self-pay

## 2021-09-28 DIAGNOSIS — I509 Heart failure, unspecified: Secondary | ICD-10-CM

## 2021-09-28 DIAGNOSIS — L7632 Postprocedural hematoma of skin and subcutaneous tissue following other procedure: Secondary | ICD-10-CM | POA: Diagnosis not present

## 2021-09-28 DIAGNOSIS — E1165 Type 2 diabetes mellitus with hyperglycemia: Secondary | ICD-10-CM | POA: Diagnosis present

## 2021-09-28 DIAGNOSIS — I472 Ventricular tachycardia, unspecified: Secondary | ICD-10-CM | POA: Diagnosis not present

## 2021-09-28 DIAGNOSIS — R338 Other retention of urine: Secondary | ICD-10-CM | POA: Diagnosis present

## 2021-09-28 DIAGNOSIS — Z79899 Other long term (current) drug therapy: Secondary | ICD-10-CM

## 2021-09-28 DIAGNOSIS — N401 Enlarged prostate with lower urinary tract symptoms: Secondary | ICD-10-CM | POA: Diagnosis present

## 2021-09-28 DIAGNOSIS — B9620 Unspecified Escherichia coli [E. coli] as the cause of diseases classified elsewhere: Secondary | ICD-10-CM | POA: Diagnosis present

## 2021-09-28 DIAGNOSIS — Z79818 Long term (current) use of other agents affecting estrogen receptors and estrogen levels: Secondary | ICD-10-CM

## 2021-09-28 DIAGNOSIS — R8271 Bacteriuria: Secondary | ICD-10-CM | POA: Diagnosis present

## 2021-09-28 DIAGNOSIS — I452 Bifascicular block: Secondary | ICD-10-CM | POA: Diagnosis present

## 2021-09-28 DIAGNOSIS — I130 Hypertensive heart and chronic kidney disease with heart failure and stage 1 through stage 4 chronic kidney disease, or unspecified chronic kidney disease: Secondary | ICD-10-CM | POA: Diagnosis present

## 2021-09-28 DIAGNOSIS — Z85828 Personal history of other malignant neoplasm of skin: Secondary | ICD-10-CM | POA: Diagnosis not present

## 2021-09-28 DIAGNOSIS — I25700 Atherosclerosis of coronary artery bypass graft(s), unspecified, with unstable angina pectoris: Secondary | ICD-10-CM | POA: Diagnosis present

## 2021-09-28 DIAGNOSIS — Z888 Allergy status to other drugs, medicaments and biological substances status: Secondary | ICD-10-CM

## 2021-09-28 DIAGNOSIS — E7800 Pure hypercholesterolemia, unspecified: Secondary | ICD-10-CM | POA: Diagnosis present

## 2021-09-28 DIAGNOSIS — Z936 Other artificial openings of urinary tract status: Secondary | ICD-10-CM

## 2021-09-28 DIAGNOSIS — Z978 Presence of other specified devices: Secondary | ICD-10-CM | POA: Diagnosis not present

## 2021-09-28 DIAGNOSIS — K5730 Diverticulosis of large intestine without perforation or abscess without bleeding: Secondary | ICD-10-CM | POA: Diagnosis not present

## 2021-09-28 DIAGNOSIS — I209 Angina pectoris, unspecified: Secondary | ICD-10-CM | POA: Diagnosis not present

## 2021-09-28 DIAGNOSIS — I25119 Atherosclerotic heart disease of native coronary artery with unspecified angina pectoris: Secondary | ICD-10-CM | POA: Diagnosis not present

## 2021-09-28 DIAGNOSIS — Z794 Long term (current) use of insulin: Secondary | ICD-10-CM | POA: Diagnosis not present

## 2021-09-28 DIAGNOSIS — D72829 Elevated white blood cell count, unspecified: Secondary | ICD-10-CM | POA: Diagnosis present

## 2021-09-28 DIAGNOSIS — N184 Chronic kidney disease, stage 4 (severe): Secondary | ICD-10-CM

## 2021-09-28 DIAGNOSIS — J811 Chronic pulmonary edema: Secondary | ICD-10-CM | POA: Diagnosis not present

## 2021-09-28 DIAGNOSIS — I083 Combined rheumatic disorders of mitral, aortic and tricuspid valves: Secondary | ICD-10-CM | POA: Diagnosis present

## 2021-09-28 DIAGNOSIS — Z833 Family history of diabetes mellitus: Secondary | ICD-10-CM

## 2021-09-28 DIAGNOSIS — N261 Atrophy of kidney (terminal): Secondary | ICD-10-CM | POA: Diagnosis not present

## 2021-09-28 DIAGNOSIS — I25110 Atherosclerotic heart disease of native coronary artery with unstable angina pectoris: Secondary | ICD-10-CM | POA: Diagnosis present

## 2021-09-28 DIAGNOSIS — I480 Paroxysmal atrial fibrillation: Secondary | ICD-10-CM | POA: Diagnosis present

## 2021-09-28 DIAGNOSIS — R1084 Generalized abdominal pain: Secondary | ICD-10-CM | POA: Diagnosis present

## 2021-09-28 DIAGNOSIS — I872 Venous insufficiency (chronic) (peripheral): Secondary | ICD-10-CM | POA: Diagnosis present

## 2021-09-28 DIAGNOSIS — R2681 Unsteadiness on feet: Secondary | ICD-10-CM | POA: Diagnosis present

## 2021-09-28 DIAGNOSIS — R8290 Unspecified abnormal findings in urine: Secondary | ICD-10-CM | POA: Diagnosis present

## 2021-09-28 DIAGNOSIS — I214 Non-ST elevation (NSTEMI) myocardial infarction: Principal | ICD-10-CM | POA: Diagnosis present

## 2021-09-28 DIAGNOSIS — R109 Unspecified abdominal pain: Secondary | ICD-10-CM | POA: Diagnosis present

## 2021-09-28 DIAGNOSIS — Z955 Presence of coronary angioplasty implant and graft: Secondary | ICD-10-CM

## 2021-09-28 DIAGNOSIS — I5021 Acute systolic (congestive) heart failure: Secondary | ICD-10-CM | POA: Diagnosis not present

## 2021-09-28 DIAGNOSIS — E1122 Type 2 diabetes mellitus with diabetic chronic kidney disease: Secondary | ICD-10-CM | POA: Diagnosis present

## 2021-09-28 DIAGNOSIS — N3289 Other specified disorders of bladder: Secondary | ICD-10-CM | POA: Diagnosis not present

## 2021-09-28 DIAGNOSIS — I2510 Atherosclerotic heart disease of native coronary artery without angina pectoris: Secondary | ICD-10-CM | POA: Diagnosis present

## 2021-09-28 DIAGNOSIS — I4892 Unspecified atrial flutter: Secondary | ICD-10-CM | POA: Diagnosis present

## 2021-09-28 DIAGNOSIS — I110 Hypertensive heart disease with heart failure: Secondary | ICD-10-CM | POA: Diagnosis not present

## 2021-09-28 DIAGNOSIS — Z7901 Long term (current) use of anticoagulants: Secondary | ICD-10-CM

## 2021-09-28 DIAGNOSIS — Z8249 Family history of ischemic heart disease and other diseases of the circulatory system: Secondary | ICD-10-CM

## 2021-09-28 DIAGNOSIS — I10 Essential (primary) hypertension: Secondary | ICD-10-CM | POA: Diagnosis present

## 2021-09-28 DIAGNOSIS — Y838 Other surgical procedures as the cause of abnormal reaction of the patient, or of later complication, without mention of misadventure at the time of the procedure: Secondary | ICD-10-CM | POA: Diagnosis not present

## 2021-09-28 DIAGNOSIS — K219 Gastro-esophageal reflux disease without esophagitis: Secondary | ICD-10-CM | POA: Diagnosis present

## 2021-09-28 DIAGNOSIS — E118 Type 2 diabetes mellitus with unspecified complications: Secondary | ICD-10-CM | POA: Diagnosis not present

## 2021-09-28 DIAGNOSIS — K449 Diaphragmatic hernia without obstruction or gangrene: Secondary | ICD-10-CM | POA: Diagnosis present

## 2021-09-28 DIAGNOSIS — I5043 Acute on chronic combined systolic (congestive) and diastolic (congestive) heart failure: Secondary | ICD-10-CM | POA: Diagnosis present

## 2021-09-28 DIAGNOSIS — I5020 Unspecified systolic (congestive) heart failure: Secondary | ICD-10-CM | POA: Diagnosis present

## 2021-09-28 DIAGNOSIS — I25810 Atherosclerosis of coronary artery bypass graft(s) without angina pectoris: Secondary | ICD-10-CM | POA: Diagnosis not present

## 2021-09-28 LAB — COMPREHENSIVE METABOLIC PANEL
ALT: 28 U/L (ref 0–44)
AST: 51 U/L — ABNORMAL HIGH (ref 15–41)
Albumin: 3.2 g/dL — ABNORMAL LOW (ref 3.5–5.0)
Alkaline Phosphatase: 42 U/L (ref 38–126)
Anion gap: 8 (ref 5–15)
BUN: 34 mg/dL — ABNORMAL HIGH (ref 8–23)
CO2: 21 mmol/L — ABNORMAL LOW (ref 22–32)
Calcium: 9.3 mg/dL (ref 8.9–10.3)
Chloride: 110 mmol/L (ref 98–111)
Creatinine, Ser: 2.18 mg/dL — ABNORMAL HIGH (ref 0.61–1.24)
GFR, Estimated: 29 mL/min — ABNORMAL LOW (ref >60)
Glucose, Bld: 281 mg/dL — ABNORMAL HIGH (ref 70–99)
Potassium: 3.8 mmol/L (ref 3.5–5.1)
Sodium: 139 mmol/L (ref 135–145)
Total Bilirubin: 0.9 mg/dL (ref 0.3–1.2)
Total Protein: 6.8 g/dL (ref 6.5–8.1)

## 2021-09-28 LAB — CBC
HCT: 37.1 % — ABNORMAL LOW (ref 39.0–52.0)
Hemoglobin: 12.2 g/dL — ABNORMAL LOW (ref 13.0–17.0)
MCH: 30.2 pg (ref 26.0–34.0)
MCHC: 32.9 g/dL (ref 30.0–36.0)
MCV: 91.8 fL (ref 80.0–100.0)
Platelets: 236 10*3/uL (ref 150–400)
RBC: 4.04 MIL/uL — ABNORMAL LOW (ref 4.22–5.81)
RDW: 13.5 % (ref 11.5–15.5)
WBC: 8.2 10*3/uL (ref 4.0–10.5)
nRBC: 0 % (ref 0.0–0.2)

## 2021-09-28 LAB — URINALYSIS, ROUTINE W REFLEX MICROSCOPIC
Bilirubin Urine: NEGATIVE
Glucose, UA: >=500 mg/dL — AB
Ketones, ur: NEGATIVE mg/dL
Nitrite: NEGATIVE
Protein, ur: >=300 mg/dL — AB
Specific Gravity, Urine: 1.025 (ref 1.005–1.030)
pH: 5 (ref 5.0–8.0)

## 2021-09-28 LAB — BRAIN NATRIURETIC PEPTIDE: B Natriuretic Peptide: 1791.6 pg/mL — ABNORMAL HIGH (ref 0.0–100.0)

## 2021-09-28 LAB — URINALYSIS, MICROSCOPIC (REFLEX)

## 2021-09-28 LAB — TROPONIN I (HIGH SENSITIVITY)
Troponin I (High Sensitivity): 2318 ng/L (ref <18)
Troponin I (High Sensitivity): 2635 ng/L (ref <18)

## 2021-09-28 LAB — LIPASE, BLOOD: Lipase: 40 U/L (ref 11–51)

## 2021-09-28 MED ORDER — ASPIRIN 325 MG PO TABS: 325.0000 mg | ORAL_TABLET | Freq: Every day | ORAL | Status: DC

## 2021-09-28 MED ORDER — HEPARIN (PORCINE) 25000 UT/250ML-% IV SOLN
1100.0000 [IU]/h | INTRAVENOUS | Status: DC
  Administered 2021-09-28: 900 [IU]/h via INTRAVENOUS
  Filled 2021-09-28: qty 250

## 2021-09-28 MED ORDER — ASPIRIN 325 MG PO TABS
325.0000 mg | ORAL_TABLET | Freq: Once | ORAL | Status: AC
  Administered 2021-09-28: 325 mg via ORAL
  Filled 2021-09-28: qty 1

## 2021-09-28 MED ORDER — FUROSEMIDE 10 MG/ML IJ SOLN
80.0000 mg | Freq: Once | INTRAMUSCULAR | Status: AC
  Administered 2021-09-28: 80 mg via INTRAVENOUS
  Filled 2021-09-28: qty 8

## 2021-09-28 NOTE — ED Triage Notes (Signed)
Patient here POV from Home.  Endorses ABD Pain that is Mid ABD and began 2-3 Weeks ago. Worsened Today. Intermittent in [place].   Also Endorses Bilateral Arm Soreness. No N/V/D. Possible Constipation.   NAD Noted during Triage. A&Ox4. GCS 15. Ambulatory.

## 2021-09-28 NOTE — ED Provider Notes (Signed)
[facility] EMERGENCY DEPARTMENT Provider Note   CSN: [id number] Arrival date & time: 09/28/21  1616     History  Chief Complaint  Patient presents with   Abdominal Pain    Nathan [name] is a 88 y.o. male.  Patient is a 88 year old male who presents with abdominal pain.  He has a history of paroxysmal atrial fibrillation on Eliquis, coronary artery disease, aortic insufficiency, mitral regurg, diabetes mellitus.  He reports that for about last 3 weeks he has had some intermittent abdominal pain.  It started in the lower part of his abdomen but now its more in the mid abdomen.  He denies any associated nausea or vomiting.  He is having bowel movements but says are smaller than normal.  No urinary symptoms.  He does say that at times when he is exerting himself, he feels a little bit short of breath but he does not feel like that is changed from what his baseline is.  He does also state that he has some pain in his arms that comes and goes.  It seems to be worse with exertion.  He says it lasts about 2 to 3 minutes and goes away when he rests.  He denies any associated chest pain.       Home Medications Prior to Admission medications   Medication Sig Start Date End Date Taking? Authorizing Provider  albuterol (VENTOLIN HFA) 108 (90 Base) MCG/ACT inhaler Inhale 2 puffs into the lungs 2 (two) times daily. 11/13/19   [provider]  apixaban (ELIQUIS) 2.5 MG TABS tablet Take 2.5 mg by mouth 2 (two) times daily.    [provider]  Blood Glucose Monitoring Suppl (ONETOUCH VERIO) w/Device KIT  09/06/17   [provider]  cholecalciferol (VITAMIN D3) 25 MCG (1000 UNIT) tablet Take 1,000 Units by mouth daily.    [provider]  feeding supplement (ENSURE ENLIVE / ENSURE PLUS) LIQD Take 237 mLs by mouth 2 (two) times daily between meals. 05/12/20   Geradine Girt, DO  fenofibrate (TRICOR) 145 MG tablet TAKE 1 TABLET BY MOUTH EVERY DAY 03/23/21   Nahser,  Wonda Cheng, MD  finasteride (PROSCAR) 5 MG tablet Take 5 mg by mouth daily.    [provider]  insulin NPH-regular Human (NOVOLIN 70/30) (70-30) 100 UNIT/ML injection Inject 10-25 Units into the skin See admin instructions. 25 units in the morning & 10 units at dinner Sliding Scale if BS>150    [provider]  Lancets (ONETOUCH ULTRASOFT) lancets  09/06/17   [provider]  magnesium oxide (MAG-OX) 400 MG tablet Take 1 tablet by mouth daily. 04/06/21   [provider]  megestrol (MEGACE) 20 MG tablet Take 20 mg by mouth daily. 08/01/20   [provider]  metoprolol tartrate (LOPRESSOR) 25 MG tablet Take 1 tablet (25 mg total) by mouth 2 (two) times daily. 04/27/21   Nahser, Wonda Cheng, MD  Multiple Vitamins-Minerals (CENTRUM SILVER PO) Take 1 tablet by mouth daily.    [provider]  nitroGLYCERIN (NITROSTAT) 0.4 MG SL tablet Place 1 tablet (0.4 mg total) under the tongue every 5 (five) minutes as needed for chest pain. 04/04/18   Richardson Dopp T, PA-C  omeprazole (PRILOSEC) 20 MG capsule Take 20 mg by mouth 2 (two) times daily.  01/06/11   [provider]  ONETOUCH VERIO test strip  09/06/17   [provider]  Iron 1ML/31G 31G X 5/16" 1 ML MISC USE 1 TWICE  DAILY AS DIRECTED 09/29/17   [provider]  sodium bicarbonate 650 MG tablet Take 650 mg by mouth 2 (two) times daily. 04/06/21   [provider]      Allergies    Flomax [tamsulosin hcl] and Lasix [furosemide]    Review of Systems   Review of Systems  Constitutional:  Negative for chills, diaphoresis, fatigue and fever.  HENT:  Negative for congestion, rhinorrhea and sneezing.   Eyes: Negative.   Respiratory:  Positive for shortness of breath. Negative for cough and chest tightness.   Cardiovascular:  Negative for chest pain and leg swelling.  Gastrointestinal:  Positive for abdominal pain and constipation. Negative for blood in stool,  diarrhea, nausea and vomiting.  Genitourinary:  Negative for difficulty urinating, flank pain, frequency and hematuria.  Musculoskeletal:  Negative for arthralgias and back pain.  Skin:  Negative for rash.  Neurological:  Negative for dizziness, speech difficulty, weakness, numbness and headaches.    Physical Exam Updated Vital Signs BP (!) 161/100 (BP Location: Left Arm)   Pulse 75   Temp 98.1 F (36.7 C) (Oral)   Resp (!) 25   Ht 5' 11"  (1.803 m)   Wt 75 kg   SpO2 93%   BMI 23.06 kg/m  Physical Exam Constitutional:      Appearance: He is well-developed.  HENT:     Head: Normocephalic and atraumatic.  Eyes:     Pupils: Pupils are equal, round, and reactive to light.  Cardiovascular:     Rate and Rhythm: Normal rate and regular rhythm.     Heart sounds: Murmur heard.  Pulmonary:     Effort: Pulmonary effort is normal. No respiratory distress.     Breath sounds: Normal breath sounds. No wheezing or rales.  Chest:     Chest wall: No tenderness.  Abdominal:     General: Bowel sounds are normal.     Palpations: Abdomen is soft.     Tenderness: There is no abdominal tenderness. There is no guarding or rebound.  Musculoskeletal:        General: Normal range of motion.     Cervical back: Normal range of motion and neck supple.  Lymphadenopathy:     Cervical: No cervical adenopathy.  Skin:    General: Skin is warm and dry.     Findings: No rash.  Neurological:     Mental Status: He is alert and oriented to person, place, and time.     ED Results / Procedures / Treatments   Labs (all labs ordered are listed, but only abnormal results are displayed) Labs Reviewed  COMPREHENSIVE METABOLIC PANEL - Abnormal; Notable for the following components:      Result Value   CO2 21 (*)    Glucose, Bld 281 (*)    BUN 34 (*)    Creatinine, Ser 2.18 (*)    Albumin 3.2 (*)    AST 51 (*)    GFR, Estimated 29 (*)    All other components within normal limits  CBC - Abnormal;  Notable for the following components:   RBC 4.04 (*)    Hemoglobin 12.2 (*)    HCT 37.1 (*)    All other components within normal limits  URINALYSIS, ROUTINE W REFLEX MICROSCOPIC - Abnormal; Notable for the following components:   APPearance CLOUDY (*)    Glucose, UA >=500 (*)    Hgb urine dipstick MODERATE (*)    Protein, ur >=300 (*)    Leukocytes,Ua SMALL (*)  All other components within normal limits  BRAIN NATRIURETIC PEPTIDE - Abnormal; Notable for the following components:   B Natriuretic Peptide 1,791.6 (*)    All other components within normal limits  URINALYSIS, MICROSCOPIC (REFLEX) - Abnormal; Notable for the following components:   Bacteria, UA MANY (*)    All other components within normal limits  TROPONIN I (HIGH SENSITIVITY) - Abnormal; Notable for the following components:   Troponin I (High Sensitivity) 2,318 (*)    All other components within normal limits  TROPONIN I (HIGH SENSITIVITY) - Abnormal; Notable for the following components:   Troponin I (High Sensitivity) 2,635 (*)    All other components within normal limits  URINE CULTURE  LIPASE, BLOOD  HEPARIN LEVEL (UNFRACTIONATED)  APTT    EKG EKG Interpretation  Date/Time:  Monday September 28 2021 21:09:03 EDT Ventricular Rate:  80 PR Interval:  269 QRS Duration: 140 QT Interval:  412 QTC Calculation: 476 R Axis:   -59 Text Interpretation: Sinus rhythm Prolonged PR interval RBBB and LAFB similar to prior EKGs Confirmed by Malvin Johns [phone]) on 09/28/2021 9:11:47 PM  Radiology CT ABDOMEN PELVIS WO CONTRAST  Result Date: 09/28/2021 CLINICAL DATA:  Abdominal pain, acute, nonlocalized. 88 yo male. Endorses ABD Pain that is Mid ABD and began 2-3 Weeks ago. Worsened Today. Intermittent in [place]. Possible contstipation GFR=29 EXAM: CT ABDOMEN AND PELVIS WITHOUT CONTRAST TECHNIQUE: Multidetector CT imaging of the abdomen and pelvis was performed following the standard protocol without IV contrast.  RADIATION DOSE REDUCTION: This exam was performed according to the departmental dose-optimization program which includes automated exposure control, adjustment of the mA and/or kV according to patient size and/or use of iterative reconstruction technique. COMPARISON:  None Available. FINDINGS: Lower chest: Mild bronchial wall thickening. No acute abnormality. Coronary artery calcifications. Hiatal hernia containing mesenteric fat. Hepatobiliary: No focal liver abnormality. No gallstones, gallbladder wall thickening, or pericholecystic fluid. No biliary dilatation. Pancreas: No focal lesion. Normal pancreatic contour. No surrounding inflammatory changes. No main pancreatic ductal dilatation. Spleen: Normal in size without focal abnormality. Adrenals/Urinary Tract: No adrenal nodule bilaterally. No nephrolithiasis and no hydronephrosis. Query left parapelvic cyst (5:35). No ureterolithiasis or hydroureter. Foley catheter tip and balloon terminate within the urinary bladder lumen. The urinary bladder is decompressed and grossly unremarkable. Nonspecific foci of gas associated with the urinary bladder lumen. Stomach/Bowel: Stomach is within normal limits. No evidence of bowel wall thickening or dilatation. Diffuse colonic diverticulosis. Appendix appears normal. Vascular/Lymphatic: No abdominal aorta or iliac aneurysm. Severe atherosclerotic plaque of the aorta and its branches. No abdominal, pelvic, or inguinal lymphadenopathy. Reproductive: The prostate is enlarged measuring up to 5.5 cm. Other: No intraperitoneal free fluid. No intraperitoneal free gas. No organized fluid collection. Musculoskeletal: No abdominal wall hernia or abnormality. No suspicious lytic or blastic osseous lesions. No acute displaced fracture. Multilevel degenerative changes of the spine. Grade 2 anterolisthesis of L5 on S1 with associated bilateral L5 pars interarticularis defects. Mild retrolisthesis of L4 on L5. IMPRESSION: 1. Small hiatal  hernia with associated mesenteric fat. 2. Colonic diverticulosis with no acute diverticulitis. 3. Grade 2 anterolisthesis of L5 on S1 with associated bilateral L5 pars interarticularis defects. 4. Prostatomegaly. 5.  Aortic Atherosclerosis (ICD10-I70.0). Electronically Signed   By: Iven Finn M.D.   On: 09/28/2021 21:19   DG Chest 2 View  Result Date: 09/28/2021 CLINICAL DATA:  Angina EXAM: CHEST - 2 VIEW COMPARISON:  Radiographs 05/07/2020 FINDINGS: No focal consolidation, pleural effusion, or pneumothorax. Borderline cardiomegaly. Sternotomy and CABG. Pulmonary  vascular congestion. No acute osseous abnormality. IMPRESSION: Borderline cardiomegaly and pulmonary vascular congestion. Electronically Signed   By: Placido Sou M.D.   On: 09/28/2021 20:45    Procedures Procedures    Medications Ordered in ED Medications  heparin ADULT infusion 100 units/mL (25000 units/257m) (900 Units/hr Intravenous New Bag/Given 09/28/21 2254)  furosemide (LASIX) injection 80 mg (80 mg Intravenous Given 09/28/21 2248)  aspirin tablet 325 mg (325 mg Oral Given 09/28/21 2247)    ED Course/ Medical Decision Making/ A&P                           Medical Decision Making Amount and/or Complexity of Data Reviewed Labs: ordered. Radiology: ordered.  Risk OTC drugs. Prescription drug management.   Patient is a 88year old male who presents with complaints of abdominal pain.  His abdominal pain is vague and intermittent.  He describes he also is having some intermittent episodes of pain in his arms and some shortness of breath when he has any exertion, even minimal.  He does not have any specific chest pain.  His EKG shows some ST depression anteriorly and a little bit of elevation in aVR.  However these findings were also noted on prior EKGs per chart review.  His troponin is markedly elevated in the 2000's.  Second troponin was slightly higher.  Chest x-ray was performed which was interpreted by me and  confirmed by radiology to show evidence of vascular congestion.  His BNP is markedly elevated.  CT scan of his abdomen pelvis show no acute abnormality.  He currently does not have any anginal type symptoms.  He was given aspirin and started on heparin.  His last Eliquis dose was this morning.  I spoke with Dr. RKalman Shanwith cardiology.  He recommends also giving Lasix and [facility]  He recommends admission to the hospitalist service.  He also recommends if the patient has any recurrent anginal symptoms to load him with 300 mg of Plavix.  His urinalysis has some suggestions of infection but it looks more like a dirty specimen.  It was sent for culture.  He is currently pain-free.  We are waiting hospitalist callback for admission.  Dr. SKarle Starchdid take that call.  CRITICAL CARE Performed by: MMalvin JohnsTotal critical care time: 75 minutes Critical care time was exclusive of separately billable procedures and treating other patients. Critical care was necessary to treat or prevent imminent or life-threatening deterioration. Critical care was time spent personally by me on the following activities: development of treatment plan with patient and/or surrogate as well as nursing, discussions with consultants, evaluation of patient's response to treatment, examination of patient, obtaining history from patient or surrogate, ordering and performing treatments and interventions, ordering and review of laboratory studies, ordering and review of radiographic studies, pulse oximetry and re-evaluation of patient's condition.   Final Clinical Impression(s) / ED Diagnoses Final diagnoses:  Generalized abdominal pain  NSTEMI (non-ST elevated myocardial infarction) ([facility]  Acute on chronic congestive heart failure, unspecified heart failure type ([facility]    Rx / DC Orders ED Discharge Orders     None         BMalvin Johns MD 09/28/21 2338

## 2021-09-28 NOTE — ED Notes (Signed)
Client reminded that MD has ordered a urine spec as part of his examination, will let staff know when he can void and provide a sample

## 2021-09-28 NOTE — Progress Notes (Signed)
ANTICOAGULATION CONSULT NOTE - Initial Consult  Pharmacy Consult for heparin Indication: chest pain/ACS  Allergies  Allergen Reactions   Flomax [Tamsulosin Hcl] Other (See Comments)    Dizzy   Lasix [Furosemide]     Dizziness    Patient Measurements: Height: '5\' 11"'$  (180.3 cm) Weight: 75 kg (165 lb 5.5 oz) IBW/kg (Calculated) : 75.3 Heparin Dosing Weight: TBW  Vital Signs: Temp: 98.6 F (37 C) (08/14 1838) Temp Source: Oral (08/14 1838) BP: 139/95 (08/14 2200) Pulse Rate: 73 (08/14 2200)  Labs: Recent Labs    09/28/21 1627 09/28/21 1940 09/28/21 2121  HGB 12.2*  --   --   HCT 37.1*  --   --   PLT 236  --   --   CREATININE 2.18*  --   --   TROPONINIHS  --  2,318* 2,635*    Estimated Creatinine Clearance: 26.3 mL/min (A) (by C-G formula based on SCr of 2.18 mg/dL (H)).   Medical History: Past Medical History:  Diagnosis Date   Acid reflux    Aortic insufficiency    Cholecystitis    Combined systolic and diastolic heart failure ([place])    Echo 04/2018: inf-lat HK, EF 40-45, severe basal septal hypertrophy, mild conc LVH, Gr 1 DD, severe LAE, mild to mod TR, mod AI, asc Aorta 39 mm   Coronary artery disease 2002   CABG x5   Diabetes mellitus    DVT (deep venous thrombosis) (HCC)    previously on coumadin   Hypercholesteremia    Hyperlipidemia    Mitral valve regurgitation    Skin cancer    tumor removed off of his back    Assessment: 43 YOM presenting with abdominal pain, elevated troponin, hx of afib on Eliquis with last dose this AM  Goal of Therapy:  Heparin level 0.3-0.7 units/ml aPTT 66-102 seconds Monitor platelets by anticoagulation protocol: Yes   Plan:  Heparin gtt at 900 units/hr, no bolus F/u 8 hour aPTT/HL F/u cards eval and recs  [name], PharmD Clinical Pharmacist ED Pharmacist Phone # [phone] 09/28/2021 10:34 PM

## 2021-09-28 NOTE — ED Notes (Signed)
States has been having some abd pain with some constipation for the past 2 weeks. Denies nausea and vomiting, able to tolerate PO fluids. States he has been taking Mira lax frequently, last dose approx 1 week ago.

## 2021-09-29 ENCOUNTER — Inpatient Hospital Stay (HOSPITAL_COMMUNITY): Payer: PPO

## 2021-09-29 DIAGNOSIS — I5020 Unspecified systolic (congestive) heart failure: Secondary | ICD-10-CM | POA: Diagnosis present

## 2021-09-29 DIAGNOSIS — I5021 Acute systolic (congestive) heart failure: Secondary | ICD-10-CM | POA: Diagnosis not present

## 2021-09-29 DIAGNOSIS — D72829 Elevated white blood cell count, unspecified: Secondary | ICD-10-CM | POA: Diagnosis present

## 2021-09-29 DIAGNOSIS — I25810 Atherosclerosis of coronary artery bypass graft(s) without angina pectoris: Secondary | ICD-10-CM | POA: Diagnosis not present

## 2021-09-29 DIAGNOSIS — B9620 Unspecified Escherichia coli [E. coli] as the cause of diseases classified elsewhere: Secondary | ICD-10-CM | POA: Diagnosis present

## 2021-09-29 DIAGNOSIS — E1122 Type 2 diabetes mellitus with diabetic chronic kidney disease: Secondary | ICD-10-CM | POA: Diagnosis present

## 2021-09-29 DIAGNOSIS — E118 Type 2 diabetes mellitus with unspecified complications: Secondary | ICD-10-CM | POA: Diagnosis not present

## 2021-09-29 DIAGNOSIS — I472 Ventricular tachycardia, unspecified: Secondary | ICD-10-CM | POA: Diagnosis not present

## 2021-09-29 DIAGNOSIS — I214 Non-ST elevation (NSTEMI) myocardial infarction: Secondary | ICD-10-CM | POA: Diagnosis present

## 2021-09-29 DIAGNOSIS — Z978 Presence of other specified devices: Secondary | ICD-10-CM

## 2021-09-29 DIAGNOSIS — N184 Chronic kidney disease, stage 4 (severe): Secondary | ICD-10-CM

## 2021-09-29 DIAGNOSIS — I25119 Atherosclerotic heart disease of native coronary artery with unspecified angina pectoris: Secondary | ICD-10-CM

## 2021-09-29 DIAGNOSIS — I130 Hypertensive heart and chronic kidney disease with heart failure and stage 1 through stage 4 chronic kidney disease, or unspecified chronic kidney disease: Secondary | ICD-10-CM | POA: Diagnosis present

## 2021-09-29 DIAGNOSIS — I5043 Acute on chronic combined systolic (congestive) and diastolic (congestive) heart failure: Secondary | ICD-10-CM | POA: Diagnosis present

## 2021-09-29 DIAGNOSIS — Z85828 Personal history of other malignant neoplasm of skin: Secondary | ICD-10-CM | POA: Diagnosis not present

## 2021-09-29 DIAGNOSIS — E1165 Type 2 diabetes mellitus with hyperglycemia: Secondary | ICD-10-CM | POA: Diagnosis present

## 2021-09-29 DIAGNOSIS — R109 Unspecified abdominal pain: Secondary | ICD-10-CM | POA: Diagnosis not present

## 2021-09-29 DIAGNOSIS — R8271 Bacteriuria: Secondary | ICD-10-CM | POA: Diagnosis present

## 2021-09-29 DIAGNOSIS — Z936 Other artificial openings of urinary tract status: Secondary | ICD-10-CM | POA: Diagnosis not present

## 2021-09-29 DIAGNOSIS — I10 Essential (primary) hypertension: Secondary | ICD-10-CM

## 2021-09-29 DIAGNOSIS — R338 Other retention of urine: Secondary | ICD-10-CM | POA: Diagnosis present

## 2021-09-29 DIAGNOSIS — I2510 Atherosclerotic heart disease of native coronary artery without angina pectoris: Secondary | ICD-10-CM | POA: Diagnosis not present

## 2021-09-29 DIAGNOSIS — I25110 Atherosclerotic heart disease of native coronary artery with unstable angina pectoris: Secondary | ICD-10-CM | POA: Diagnosis present

## 2021-09-29 DIAGNOSIS — I4892 Unspecified atrial flutter: Secondary | ICD-10-CM | POA: Diagnosis present

## 2021-09-29 DIAGNOSIS — I25700 Atherosclerosis of coronary artery bypass graft(s), unspecified, with unstable angina pectoris: Secondary | ICD-10-CM | POA: Diagnosis present

## 2021-09-29 DIAGNOSIS — L7632 Postprocedural hematoma of skin and subcutaneous tissue following other procedure: Secondary | ICD-10-CM | POA: Diagnosis not present

## 2021-09-29 DIAGNOSIS — Y838 Other surgical procedures as the cause of abnormal reaction of the patient, or of later complication, without mention of misadventure at the time of the procedure: Secondary | ICD-10-CM | POA: Diagnosis not present

## 2021-09-29 DIAGNOSIS — I480 Paroxysmal atrial fibrillation: Secondary | ICD-10-CM | POA: Diagnosis present

## 2021-09-29 DIAGNOSIS — Z794 Long term (current) use of insulin: Secondary | ICD-10-CM | POA: Diagnosis not present

## 2021-09-29 DIAGNOSIS — I083 Combined rheumatic disorders of mitral, aortic and tricuspid valves: Secondary | ICD-10-CM | POA: Diagnosis present

## 2021-09-29 DIAGNOSIS — I452 Bifascicular block: Secondary | ICD-10-CM | POA: Diagnosis present

## 2021-09-29 DIAGNOSIS — R8290 Unspecified abnormal findings in urine: Secondary | ICD-10-CM | POA: Diagnosis present

## 2021-09-29 DIAGNOSIS — R1084 Generalized abdominal pain: Secondary | ICD-10-CM | POA: Diagnosis present

## 2021-09-29 DIAGNOSIS — N401 Enlarged prostate with lower urinary tract symptoms: Secondary | ICD-10-CM | POA: Diagnosis present

## 2021-09-29 LAB — CBC
HCT: 39.9 % (ref 39.0–52.0)
Hemoglobin: 13.4 g/dL (ref 13.0–17.0)
MCH: 30.8 pg (ref 26.0–34.0)
MCHC: 33.6 g/dL (ref 30.0–36.0)
MCV: 91.7 fL (ref 80.0–100.0)
Platelets: 242 10*3/uL (ref 150–400)
RBC: 4.35 MIL/uL (ref 4.22–5.81)
RDW: 13.5 % (ref 11.5–15.5)
WBC: 10.7 10*3/uL — ABNORMAL HIGH (ref 4.0–10.5)
nRBC: 0 % (ref 0.0–0.2)

## 2021-09-29 LAB — GLUCOSE, CAPILLARY: Glucose-Capillary: 210 mg/dL — ABNORMAL HIGH (ref 70–99)

## 2021-09-29 LAB — ECHOCARDIOGRAM COMPLETE
AR max vel: 2.75 cm2
AV Area VTI: 2.79 cm2
AV Area mean vel: 2.66 cm2
AV Mean grad: 2 mmHg
AV Peak grad: 3.2 mmHg
Ao pk vel: 0.9 m/s
Height: 71 in
P 1/2 time: 333 msec
S' Lateral: 4.5 cm
Single Plane A4C EF: 22 %
Weight: 2500.9 oz

## 2021-09-29 LAB — BASIC METABOLIC PANEL
Anion gap: 8 (ref 5–15)
BUN: 29 mg/dL — ABNORMAL HIGH (ref 8–23)
CO2: 23 mmol/L (ref 22–32)
Calcium: 9.6 mg/dL (ref 8.9–10.3)
Chloride: 110 mmol/L (ref 98–111)
Creatinine, Ser: 2.1 mg/dL — ABNORMAL HIGH (ref 0.61–1.24)
GFR, Estimated: 30 mL/min — ABNORMAL LOW (ref >60)
Glucose, Bld: 233 mg/dL — ABNORMAL HIGH (ref 70–99)
Potassium: 3.9 mmol/L (ref 3.5–5.1)
Sodium: 141 mmol/L (ref 135–145)

## 2021-09-29 LAB — HEPARIN LEVEL (UNFRACTIONATED): Heparin Unfractionated: 0.63 IU/mL (ref 0.30–0.70)

## 2021-09-29 LAB — HEMOGLOBIN A1C
Hgb A1c MFr Bld: 7.3 % — ABNORMAL HIGH (ref 4.8–5.6)
Mean Plasma Glucose: 162.81 mg/dL

## 2021-09-29 LAB — MAGNESIUM: Magnesium: 1.6 mg/dL — ABNORMAL LOW (ref 1.7–2.4)

## 2021-09-29 LAB — APTT
aPTT: 51 seconds — ABNORMAL HIGH (ref 24–36)
aPTT: 60 seconds — ABNORMAL HIGH (ref 24–36)

## 2021-09-29 LAB — MRSA NEXT GEN BY PCR, NASAL: MRSA by PCR Next Gen: DETECTED — AB

## 2021-09-29 MED ORDER — FUROSEMIDE 10 MG/ML IJ SOLN
40.0000 mg | Freq: Two times a day (BID) | INTRAMUSCULAR | Status: AC
  Administered 2021-09-29: 40 mg via INTRAVENOUS
  Filled 2021-09-29: qty 4

## 2021-09-29 MED ORDER — SODIUM CHLORIDE 0.9 % WEIGHT BASED INFUSION
3.0000 mL/kg/h | INTRAVENOUS | Status: DC
  Administered 2021-09-30: 3 mL/kg/h via INTRAVENOUS

## 2021-09-29 MED ORDER — FENOFIBRATE 160 MG PO TABS: 160.0000 mg | ORAL_TABLET | Freq: Every day | ORAL | Status: DC

## 2021-09-29 MED ORDER — ROSUVASTATIN CALCIUM 5 MG PO TABS
10.0000 mg | ORAL_TABLET | Freq: Every day | ORAL | Status: DC
  Administered 2021-09-29: 10 mg via ORAL
  Filled 2021-09-29: qty 2

## 2021-09-29 MED ORDER — SODIUM CHLORIDE 0.9 % WEIGHT BASED INFUSION
1.0000 mL/kg/h | INTRAVENOUS | Status: DC
  Administered 2021-09-30: 1 mL/kg/h via INTRAVENOUS

## 2021-09-29 MED ORDER — POTASSIUM CHLORIDE CRYS ER 20 MEQ PO TBCR
40.0000 meq | EXTENDED_RELEASE_TABLET | Freq: Once | ORAL | Status: AC
Start: 2021-09-29 — End: 2021-09-29
  Administered 2021-09-29: 40 meq via ORAL
  Filled 2021-09-29: qty 2

## 2021-09-29 MED ORDER — SODIUM CHLORIDE 0.9% FLUSH
3.0000 mL | Freq: Two times a day (BID) | INTRAVENOUS | Status: DC
  Administered 2021-09-29 – 2021-10-02 (×4): 3 mL via INTRAVENOUS

## 2021-09-29 MED ORDER — SODIUM CHLORIDE 0.9% FLUSH: 3.0000 mL | INTRAVENOUS | Status: DC | PRN

## 2021-09-29 MED ORDER — PERFLUTREN LIPID MICROSPHERE
1.0000 mL | INTRAVENOUS | Status: AC | PRN
  Administered 2021-09-29: 3 mL via INTRAVENOUS

## 2021-09-29 MED ORDER — METOPROLOL TARTRATE 25 MG PO TABS
25.0000 mg | ORAL_TABLET | Freq: Two times a day (BID) | ORAL | Status: DC
  Administered 2021-09-29 – 2021-10-03 (×9): 25 mg via ORAL
  Filled 2021-09-29 (×8): qty 1

## 2021-09-29 MED ORDER — ALBUTEROL SULFATE (2.5 MG/3ML) 0.083% IN NEBU: 2.5000 mg | INHALATION_SOLUTION | Freq: Four times a day (QID) | RESPIRATORY_TRACT | Status: DC | PRN

## 2021-09-29 MED ORDER — MEGESTROL ACETATE 20 MG PO TABS
20.0000 mg | ORAL_TABLET | Freq: Every day | ORAL | Status: DC
  Administered 2021-09-30 – 2021-10-05 (×6): 20 mg via ORAL
  Filled 2021-09-29 (×6): qty 1

## 2021-09-29 MED ORDER — ASPIRIN 81 MG PO CHEW
81.0000 mg | CHEWABLE_TABLET | ORAL | Status: AC
  Administered 2021-09-30: 81 mg via ORAL
  Filled 2021-09-29: qty 1

## 2021-09-29 MED ORDER — ACETAMINOPHEN 650 MG RE SUPP: 650.0000 mg | Freq: Four times a day (QID) | RECTAL | Status: DC | PRN

## 2021-09-29 MED ORDER — SODIUM BICARBONATE 650 MG PO TABS
650.0000 mg | ORAL_TABLET | Freq: Two times a day (BID) | ORAL | Status: DC
  Administered 2021-09-29 – 2021-10-05 (×12): 650 mg via ORAL
  Filled 2021-09-29 (×12): qty 1

## 2021-09-29 MED ORDER — INSULIN ASPART PROT & ASPART (70-30 MIX) 100 UNIT/ML ~~LOC~~ SUSP
10.0000 [IU] | Freq: Two times a day (BID) | SUBCUTANEOUS | Status: DC
  Administered 2021-09-29 – 2021-10-05 (×9): 10 [IU] via SUBCUTANEOUS
  Filled 2021-09-29 (×3): qty 10

## 2021-09-29 MED ORDER — SODIUM CHLORIDE 0.9 % IV SOLN: 250.0000 mL | INTRAVENOUS | Status: DC | PRN

## 2021-09-29 MED ORDER — INSULIN ASPART 100 UNIT/ML IJ SOLN
0.0000 [IU] | Freq: Three times a day (TID) | INTRAMUSCULAR | Status: DC
  Administered 2021-09-29: 4 [IU] via SUBCUTANEOUS
  Administered 2021-09-30: 2 [IU] via SUBCUTANEOUS
  Administered 2021-10-01 (×2): 1 [IU] via SUBCUTANEOUS
  Administered 2021-10-03: 2 [IU] via SUBCUTANEOUS
  Administered 2021-10-03: 1 [IU] via SUBCUTANEOUS
  Administered 2021-10-04 (×2): 2 [IU] via SUBCUTANEOUS

## 2021-09-29 MED ORDER — PANTOPRAZOLE SODIUM 40 MG PO TBEC
40.0000 mg | DELAYED_RELEASE_TABLET | Freq: Every day | ORAL | Status: DC
  Administered 2021-09-29 – 2021-10-05 (×7): 40 mg via ORAL
  Filled 2021-09-29 (×7): qty 1

## 2021-09-29 MED ORDER — CHLORHEXIDINE GLUCONATE CLOTH 2 % EX PADS
6.0000 | MEDICATED_PAD | Freq: Every day | CUTANEOUS | Status: DC
  Administered 2021-09-29 – 2021-10-05 (×6): 6 via TOPICAL

## 2021-09-29 MED ORDER — SODIUM CHLORIDE 0.9% FLUSH
3.0000 mL | Freq: Two times a day (BID) | INTRAVENOUS | Status: DC
  Administered 2021-10-02: 3 mL via INTRAVENOUS

## 2021-09-29 MED ORDER — MAGNESIUM OXIDE -MG SUPPLEMENT 400 (240 MG) MG PO TABS
400.0000 mg | ORAL_TABLET | Freq: Every day | ORAL | Status: DC
  Administered 2021-09-30 – 2021-10-05 (×6): 400 mg via ORAL
  Filled 2021-09-29 (×7): qty 1

## 2021-09-29 MED ORDER — ACETAMINOPHEN 325 MG PO TABS: 650.0000 mg | ORAL_TABLET | Freq: Four times a day (QID) | ORAL | Status: DC | PRN

## 2021-09-29 MED ORDER — ORAL CARE MOUTH RINSE: 15.0000 mL | OROMUCOSAL | Status: DC | PRN

## 2021-09-29 MED ORDER — MAGNESIUM SULFATE 4 GM/100ML IV SOLN
4.0000 g | Freq: Once | INTRAVENOUS | Status: AC
  Administered 2021-09-29: 4 g via INTRAVENOUS
  Filled 2021-09-29: qty 100

## 2021-09-29 MED ORDER — FUROSEMIDE 10 MG/ML IJ SOLN
40.0000 mg | Freq: Two times a day (BID) | INTRAMUSCULAR | Status: DC
  Administered 2021-09-29: 40 mg via INTRAVENOUS
  Filled 2021-09-29: qty 4

## 2021-09-29 MED ORDER — FINASTERIDE 5 MG PO TABS
5.0000 mg | ORAL_TABLET | Freq: Every day | ORAL | Status: DC
  Administered 2021-09-30 – 2021-10-05 (×6): 5 mg via ORAL
  Filled 2021-09-29 (×6): qty 1

## 2021-09-29 MED ORDER — MAGNESIUM OXIDE 400 MG PO TABS: 400.0000 mg | ORAL_TABLET | Freq: Every day | ORAL | Status: DC

## 2021-09-29 NOTE — Progress Notes (Signed)
Transferring facility: [facility] Requesting provider: Dr. Karle Starch (EDP at [facility]) Reason for transfer: admission for further evaluation and management of NSTEMI.    88 year old male who presented to [place] ED on 09/28/2021 complaining of vague abdominal discomfort with radiation into the left arm.  No associated any overt chest discomfort.  CT abdomen/pelvis reportedly showed no acute process, while chest x-ray showed borderline cardiomegaly and borderline pulmonary vascular congestion.  Labs were notable for initial troponin 2600.  EKG reportedly showed sinus rhythm without overt evidence of acute ischemic changes.  EDP (Dr. Tamera Punt) d\w [facility] cards fellow (Dr. Kalman Shan), who recommended admission to Hospitalists at [facility]. Cards to consult in the AM and recommended interval heparin drip.  Medications administered prior to transfer included the following: Full dose aspirin x1.  Heparin drip initiated   Subsequently, I accepted this patient for transfer for inpatient admission to a cardiac tele bed at [facility] for further work-up and management of NSTEMI.      Check [email or web address] for on-call coverage.   Nursing staff, Please call [place] number on Amion as soon as patient's arrival, so appropriate admitting provider can evaluate the pt.     Babs Bertin, DO Hospitalist

## 2021-09-29 NOTE — Progress Notes (Signed)
Heart Failure Navigator Progress Note  Following this hospitalization to assess for HV TOC readiness.   Echo pending?  Adilene Areola, BSN, RN Heart Failure Nurse Navigator Secure Chat Only  

## 2021-09-29 NOTE — Progress Notes (Signed)
Echocardiogram reviewed at bedside. It shows an extensive new wall motion abnormality with moderate inferior and inferolateral wall hypokinesis. This suggests interval stenosis or occlusion of the sequential SVG to PDA/PLV. Plan coronary angiography tomorrow, after overnight hydration due to advanced CKD (creatinine is at baseline). We discussed that if PCI-stent revascularization, this may have to be a staged procedure to avoid excessive contrast in one day. Also reviewed the risk of worsening renal function, even progression to ESRD, as well as other risks of cardiac cath and PCI-stent. Shared Decision Making/Informed Consent The risks [stroke (1 in 1000), death (1 in 1000), kidney failure [usually temporary] (1 in 500), bleeding (1 in 200), allergic reaction [possibly serious] (1 in 200)], benefits (diagnostic support and management of coronary artery disease) and alternatives of a cardiac catheterization were discussed in detail with Nathan [name] and he is willing to proceed.

## 2021-09-29 NOTE — H&P (Addendum)
History and Physical    Patient: Nathan [name] TKP:[id number] DOB: [birth year] DOA: 09/28/2021 DOS: the patient was seen and examined on 09/29/2021 PCP: Donnajean Lopes, MD  Patient coming from: Transfer form Chief Complaint:  Chief Complaint  Patient presents with   Abdominal Pain   HPI: Nathan [name] is a 88 y.o. male with medical history significant of heart failure with reduced EF, PAF on Eliquis, mitral regurgitation, aortic insufficiency, CAD s/p CABG, HLD, DM type II, and CKD stage IV who presented with complaints of intermittent abdominal pain over the last 3 weeks.  He reports pain is mostly in the upper part of his abdomen, but previously had also been in the lower part of his abdomen.  He describes it as a indigestion feeling.  Standing up, belching, and taking Rolaids intermittently helped relieve symptoms.  Denied having any nausea or vomiting.  Patient did admit that at night symptoms would wake him up for which he was unable to rest at times.  With activity he does get a winded and has to rest with improvement in symptoms.  He has a chronic indwelling Foley catheter which she reports was changed out last week.  Denies having any fever, leg swelling, or abdominal distention.  Upon admission into the emergency department patient was noted to be afebrile with tachypnea, blood pressures elevated up to 167/67, and all other vital signs relatively maintained.  Labs from 8/14 significant for hemoglobin 12.2, BUN 34, creatinine 2.18, glucose 281, BNP 1791.6, high-sensitivity troponin 2318->2635.  Chest x-ray noted borderline cardiomegaly and pulmonary vascular congestion.  Urinalysis significant for moderate hemoglobin, small leukocytes, many bacteria,  6-10 squamous epithelial cells/LPF, and 21-50 WBCs.  MRSA ccreening was positive.  Urine cultures have been sent.  Patient has been given Lasix 80 mg IV and aspirin 325 mg p.o. Review of Systems: As mentioned in the history of present illness.  All other systems reviewed and are negative. Past Medical History:  Diagnosis Date   Acid reflux    Aortic insufficiency    Cholecystitis    Combined systolic and diastolic heart failure ([place])    Echo 04/2018: inf-lat HK, EF 40-45, severe basal septal hypertrophy, mild conc LVH, Gr 1 DD, severe LAE, mild to mod TR, mod AI, asc Aorta 39 mm   Coronary artery disease 2002   CABG x5   Diabetes mellitus    DVT (deep venous thrombosis) ([place])    previously on coumadin   Hypercholesteremia    Hyperlipidemia    Mitral valve regurgitation    Skin cancer    tumor removed off of his back   Past Surgical History:  Procedure Laterality Date   BILIARY DRAINAGE CATHETER PLACEMENT W/ BILE DUCT TUBE CHANGE  01/2015   CORONARY ARTERY BYPASS GRAFT  12/21/2000   x5   IR CATHETER TUBE CHANGE  06/18/2020   IR CHOLANGIOGRAM EXISTING TUBE  12/08/2020   IR CHOLANGIOGRAM EXISTING TUBE  12/16/2020   IR EXCHANGE BILIARY DRAIN  06/18/2020   IR EXCHANGE BILIARY DRAIN  08/13/2020   IR EXCHANGE BILIARY DRAIN  10/21/2020   IR EXCHANGE BILIARY DRAIN  11/20/2020   IR PERC CHOLECYSTOSTOMY  05/02/2020   IR RADIOLOGIST EVAL & MGMT  10/24/2020   IR RADIOLOGIST EVAL & MGMT  12/02/2020   IR REMOVAL OF CALCULI/DEBRIS BILIARY DUCT/GB  11/20/2020   IR US GUIDANCE  05/02/2020   LEFT HEART CATH AND CORS/GRAFTS ANGIOGRAPHY N/A 04/06/2018   Procedure: LEFT HEART CATH AND CORS/GRAFTS ANGIOGRAPHY;  Surgeon: Quay Burow  J, MD;  Location: [facility];  Service: Cardiovascular;  Laterality: N/A;   SKIN CANCER EXCISION     TUMOR REMOVAL     off of back, skin cancer   Social History:  reports that he has never smoked. He has never used smokeless tobacco. He reports that he does not drink alcohol and does not use drugs.  Allergies  Allergen Reactions   Flomax [Tamsulosin Hcl] Other (See Comments)    Dizzy   Lasix [Furosemide]     Dizziness    Family History  Problem Relation Age of Onset   Heart attack Father     Hypertension Mother    Diabetes Brother    Cancer Sister        breast   Cancer Brother        throat & mouth    Prior to Admission medications   Medication Sig Start Date End Date Taking? Authorizing Provider  albuterol (VENTOLIN HFA) 108 (90 Base) MCG/ACT inhaler Inhale 2 puffs into the lungs 2 (two) times daily. 11/13/19   [provider]  apixaban (ELIQUIS) 2.5 MG TABS tablet Take 2.5 mg by mouth 2 (two) times daily.    [provider]  Blood Glucose Monitoring Suppl (ONETOUCH VERIO) w/Device KIT  09/06/17   [provider]  cholecalciferol (VITAMIN D3) 25 MCG (1000 UNIT) tablet Take 1,000 Units by mouth daily.    [provider]  feeding supplement (ENSURE ENLIVE / ENSURE PLUS) LIQD Take 237 mLs by mouth 2 (two) times daily between meals. 05/12/20   Geradine Girt, DO  fenofibrate (TRICOR) 145 MG tablet TAKE 1 TABLET BY MOUTH EVERY DAY 03/23/21   Nahser, Wonda Cheng, MD  finasteride (PROSCAR) 5 MG tablet Take 5 mg by mouth daily.    [provider]  insulin NPH-regular Human (NOVOLIN 70/30) (70-30) 100 UNIT/ML injection Inject 10-25 Units into the skin See admin instructions. 25 units in the morning & 10 units at dinner Sliding Scale if BS>150    [provider]  Lancets (ONETOUCH ULTRASOFT) lancets  09/06/17   [provider]  magnesium oxide (MAG-OX) 400 MG tablet Take 1 tablet by mouth daily. 04/06/21   [provider]  megestrol (MEGACE) 20 MG tablet Take 20 mg by mouth daily. 08/01/20   [provider]  metoprolol tartrate (LOPRESSOR) 25 MG tablet Take 1 tablet (25 mg total) by mouth 2 (two) times daily. 04/27/21   Nahser, Wonda Cheng, MD  Multiple Vitamins-Minerals (CENTRUM SILVER PO) Take 1 tablet by mouth daily.    [provider]  nitroGLYCERIN (NITROSTAT) 0.4 MG SL tablet Place 1 tablet (0.4 mg total) under the tongue every 5 (five) minutes as needed for chest pain. 04/04/18   Richardson Dopp T, PA-C   omeprazole (PRILOSEC) 20 MG capsule Take 20 mg by mouth 2 (two) times daily.  01/06/11   [provider]  ONETOUCH VERIO test strip  09/06/17   [provider]  Captiva 1ML/31G 31G X 5/16" 1 ML MISC USE 1 TWICE DAILY AS DIRECTED 09/29/17   [provider]  sodium bicarbonate 650 MG tablet Take 650 mg by mouth 2 (two) times daily. 04/06/21   [provider]    Physical Exam: Vitals:   09/28/21 2330 09/29/21 0000 09/29/21 0400 09/29/21 0448  BP: (!) 153/87 (!) 158/64 (!) 158/83 (!) 167/67  Pulse: 84 76 73 74  Resp: (!) 21 (!) 23 (!) 26 (!) 22  Temp:  97.8 F (36.6 C) 97.8 F (36.6 C)  TempSrc:   Oral Oral  SpO2: 94% 92% 95% 95%  Weight:    70.9 kg  Height:    _0  (1.803 m)      Constitutional: Early male who is currently standing at bedside. Eyes: PERRL, lids and conjunctivae normal ENMT: Mucous membranes are moist. Hard of hearing. Neck: normal, supple, no masses, no thyromegaly Respiratory: Normal respiratory effort with some crackles noted in the lower lung fields.  No wheezing.  Patient able to talk in fairly complete sentences. Cardiovascular: Regular rate and rhythm, positive systolic murmur present.  Trace lower extremity edema bilaterally. Abdomen: no tenderness, no masses palpated. . Bowel sounds positive.  Foley catheter in place draining over at 1000 mL of urine in bag. Musculoskeletal: No joint deformity upper and lower extremities.  Normal muscle tone.  Skin: no rashes, lesions, ulcers. No induration Neurologic: CN 2-12 grossly intact.   Strength 5/5 in all 4.  Psychiatric: Normal judgment and insight. Alert and oriented x 3. Normal mood.   Data Reviewed:  EKG noted sinus rhythm at 79 bpm with first-degree heart block RBBB, and LAFB similar to prior.  Assessment and Plan: Heart failure with reduced EF Acute on chronic.  Patient was noted to have trace bilateral lower extremity edema.  BNP was elevated at 1791.  Chest x-ray noted borderline cardiomegaly with vascular congestion.  Last echocardiogram revealed EF of 40 - 45% with indeterminant diastolic parameters in 02/69.  Patient has been given Lasix 80 mg IV in the ED and put out over a liter of urine. -Admit to a telemetry bed -Heart failure order set utilized -Strict I&Os and daily weights -Check echocardiogram -Continue Lasix 40 mg IV twice daily for total of 2 doses -Reassess in a.m. and determine need of continued diuresis -Continue beta-blocker -Cardiology consulted,  will follow-up for any further recommendations.  Elevated troponin/NSTEMI Initial high-sensitivity troponin 2318-> 2635.  EKG appears similar to prior tracing. -Continue heparin drip per pharmacy  Abdominal pain Hiatal hernia Patient reported having abdominal epigastric pain.  CT scan of the abdomen pelvis noted small hiatal hernia with mesenteric fat.  Unclear if this is the cause of patient's symptoms or if it is secondary to cardiac. -elevate head of bed -Continue pharmacy substitution of Protonix twice daily  Leukocytosis Acute.  Repeat blood work noted WBC elevated 10.6 today.  Patient has been otherwise afebrile. -Recheck CBC tomorrow morning  CAD S/p CABG with valvular disease. -Continue BB, and statin    Essential hypertension Home blood pressure regimen includes metoprolol tartrate 25 mg twice daily. -Continue metoprolol  Uncontrolled diabetes mellitus type 2 with long-term use of insulin, with hyperglycemia On admission patient presented with glucose elevated up to 281.  Last hemoglobin A1c 7.9.  Home insulin regimen includes NovoLog 70/30 insulin 10 units every morning and 20 units nightly. -Hypoglycemic protocols -Add on hemoglobin A1c -Continue 70/30 insulin 10 units twice daily -CBG before every meal with sensitive SSI -Adjust regimen as needed  Hypomagnesemia Acute.  Initial magnesium level 1.6.  He was on scheduled magnesium replacement  daily. -Continue to monitor and adjust replacement as needed  Abnormal urinalysis chronic indwelling Foley catheter Patient had reported some lower abdominal discomfort, but recently has a chronic indwelling Foley which was recently changed.  Urinalysis was positive for moderate hemoglobin with large leukocytes, many bacteria, 6-10 squamous epithelial cells /LPF, and 21-50 WBCs.  Question possibility of contamination versus colonization versus acute infection. -Follow-up urine culture  Chronic kidney disease stage IV On admission creatinine 2.18 with BUN 34.  This appears around patient's baseline which previously had been around 2. -Continue sodium bicarb -Continue to monitor kidney function with diuresis  BPH with urinary retention -Continue Proscar  Unsteady gait Patient appeared to be unsteady on his feet. -PT to evaluate  DVT ppx: Heparin Advance Care Planning:   Code Status: Full Code    Consults: Cardiology  Family Communication: daughter updated over the phone  Severity of Illness: The appropriate patient status for this patient is INPATIENT. Inpatient status is judged to be reasonable and necessary in order to provide the required intensity of service to ensure the patient's safety. The patient's presenting symptoms, physical exam findings, and initial radiographic and laboratory data in the context of their chronic comorbidities is felt to place them at high risk for further clinical deterioration. Furthermore, it is not anticipated that the patient will be medically stable for discharge from the hospital within 2 midnights of admission.   * I certify that at the point of admission it is my clinical judgment that the patient will require inpatient hospital care spanning beyond 2 midnights from the point of admission due to high intensity of service, high risk for further deterioration and high frequency of surveillance required.*  Author: Norval Morton, MD 09/29/2021 7:17  AM  For on call review [email or web address].

## 2021-09-29 NOTE — Progress Notes (Signed)
ANTICOAGULATION CONSULT NOTE- follow-up  Pharmacy Consult for heparin Indication: chest pain/ACS  Allergies  Allergen Reactions   Flomax [Tamsulosin Hcl] Other (See Comments)    Dizzy   Lasix [Furosemide]     Dizziness   Silodosin Other (See Comments)    dizziness    Patient Measurements: Height: '5\' 11"'$  (180.3 cm) Weight: 70.9 kg (156 lb 4.9 oz) IBW/kg (Calculated) : 75.3 Heparin Dosing Weight: TBW  Vital Signs: Temp: 97.7 F (36.5 C) (08/15 1133) Temp Source: Oral (08/15 1133) BP: 157/87 (08/15 1620) Pulse Rate: 80 (08/15 1620)  Labs: Recent Labs    09/28/21 1627 09/28/21 1940 09/28/21 2121 09/29/21 0750 09/29/21 1800  HGB 12.2*  --   --  13.4  --   HCT 37.1*  --   --  39.9  --   PLT 236  --   --  242  --   APTT  --   --   --  51* 60*  HEPARINUNFRC  --   --   --  0.63  --   CREATININE 2.18*  --   --  2.10*  --   TROPONINIHS  --  2,318* 2,635*  --   --      Estimated Creatinine Clearance: 25.8 mL/min (A) (by C-G formula based on SCr of 2.1 mg/dL (H)).   Medical History: Past Medical History:  Diagnosis Date   Acid reflux    Aortic insufficiency    Cholecystitis    Combined systolic and diastolic heart failure ([place])    Echo 04/2018: inf-lat HK, EF 40-45, severe basal septal hypertrophy, mild conc LVH, Gr 1 DD, severe LAE, mild to mod TR, mod AI, asc Aorta 39 mm   Coronary artery disease 2002   CABG x5   Diabetes mellitus    DVT (deep venous thrombosis) ([facility])    previously on coumadin   Hypercholesteremia    Hyperlipidemia    Mitral valve regurgitation    Skin cancer    tumor removed off of his back    Assessment: 89 YOM presenting with abdominal pain, elevated troponin, hx of afib on Eliquis with last dose 8/14 AM.  Heparin level falsely elevated this AM from recent Eliquis, aPTT below goal.  No overt bleeding or complications noted.  No known issues with IV infusion.  09/29/2021 PM aPTT 60 seconds (subtherapeutic). No signs of bleeding and no  issues with the IV line per nursing.  Goal of Therapy:  Heparin level 0.3-0.7 units/ml aPTT 66-102 seconds Monitor platelets by anticoagulation protocol: Yes   Plan:  Increase heparin gtt to 1100 units/hr, no bolus F/u 8 hour aPTT/HL F/u cards eval and recs  Keyera Hattabaugh BS, PharmD, BCPS Clinical Pharmacist 09/29/2021 6:54 PM  Contact: [phone] after 3 PM  "Be curious, not judgmental..." -Jamal Maes

## 2021-09-29 NOTE — ED Provider Notes (Signed)
Care of the patient assumed at the change of shift pending call from Hospitalist to admit for NSTEMI. Cards on board. Heparin gtt initiated. Currently pain free.   12:32 AM Spoke with Dr. Domenick Gong, Hospitalist, who will accept for admission.    Truddie Hidden, MD 09/29/21 [phone]

## 2021-09-29 NOTE — Plan of Care (Signed)
°  Problem: Clinical Measurements: °Goal: Respiratory complications will improve °Outcome: Progressing °Goal: Cardiovascular complication will be avoided °Outcome: Progressing °  °Problem: Activity: °Goal: Risk for activity intolerance will decrease °Outcome: Progressing °  °Problem: Coping: °Goal: Level of anxiety will decrease °Outcome: Progressing °  °Problem: Pain Managment: °Goal: General experience of comfort will improve °Outcome: Progressing °  °

## 2021-09-29 NOTE — TOC Initial Note (Signed)
Transition of Care [facility]) - Initial/Assessment Note    Patient Details  Name: Nathan [name] MRN: [id number] Date of Birth: 05-Aug-1933  Transition of Care [facility]) CM/SW Contact:    Angelita Ingles, RN Phone Number:[phone]  09/29/2021, 11:19 AM  Clinical Narrative:                 TOC consulted for heart failure home health screen. Patient reports that he is from home where he lives with his daughter. Patient confirms that he has been diagnosed with heart failure in the past. Cardiologist is Dr. Cathie Olden. Patient does not use home O2. Patient does have a scale at home but states that he does not weigh himself daily. Patient does not follow a low sodium diet. Patient confirms that he does take his medications regularly and meds are affordable. [facility] [place]. Daughter at bedside confirms information received by patient. Currently there are no TOC needs. TOC will continue to follow.   Expected Discharge Plan: Home/Self Care Barriers to Discharge: Continued Medical Work up   Patient Goals and CMS Choice Patient states their goals for this hospitalization and ongoing recovery are:: Wants to go home   Choice offered to / list presented to : NA  Expected Discharge Plan and Services Expected Discharge Plan: Home/Self Care In-house Referral: NA Discharge Planning Services: NA Post Acute Care Choice: NA Living arrangements for the past 2 months: Single Family Home                 DME Arranged: N/A         HH Arranged: NA          Prior Living Arrangements/Services Living arrangements for the past 2 months: Single Family Home Lives with:: Adult Children Patient language and need for interpreter reviewed:: Yes Do you feel safe going back to the place where you live?: Yes      Need for Family Participation in Patient Care: Yes (Comment) Care giver support system in place?: Yes (comment) Current home services:  (none)    Activities of Daily Living Home  Assistive Devices/Equipment: None ADL Screening (condition at time of admission) Patient's cognitive ability adequate to safely complete daily activities?: Yes Is the patient deaf or have difficulty hearing?: No Does the patient have difficulty seeing, even when wearing glasses/contacts?: No Does the patient have difficulty concentrating, remembering, or making decisions?: No Patient able to express need for assistance with ADLs?: Yes Does the patient have difficulty dressing or bathing?: No Independently performs ADLs?: Yes (appropriate for developmental age) Does the patient have difficulty walking or climbing stairs?: Yes Weakness of Legs: Both Weakness of Arms/Hands: None  Permission Sought/Granted Permission sought to share information with : Family Supports    Share Information with NAME: Nathan [name]     Permission granted to share info w Relationship: daughter  Permission granted to share info w Contact Information: yes  Emotional Assessment Appearance:: Appears stated age Attitude/Demeanor/Rapport: Gracious Affect (typically observed): Pleasant Orientation: : Oriented to Self, Oriented to Place, Oriented to  Time, Oriented to Situation Alcohol / Substance Use: Not Applicable Psych Involvement: No (comment)  Admission diagnosis:  Generalized abdominal pain [R10.84] NSTEMI (non-ST elevated myocardial infarction) ([place]) [I21.4] Acute on chronic congestive heart failure, unspecified heart failure type [facility]) [I50.9] Patient Active Problem List   Diagnosis Date Noted   NSTEMI (non-ST elevated myocardial infarction) ([place]) 09/29/2021   Hav (hallux abducto valgus), unspecified laterality 06/19/2021   UTI (urinary tract infection) 03/29/2021  Non-ST elevation (NSTEMI) myocardial infarction ([place]) 05/13/2020   Protein-calorie malnutrition, severe 05/10/2020   Abdominal pain 05/01/2020   Calculus of gallbladder with biliary obstruction but without cholecystitis    Elevated LFTs     Aortic stenosis 03/26/2019   Chronic venous insufficiency 12/12/2018   Pain due to onychomycosis of toenails of both feet 08/11/2018   Coagulation disorder ([place]) 08/11/2018   Chronic combined systolic and diastolic CHF (congestive heart failure) ([place]) 05/11/2018   Stage 4 chronic kidney disease ([place]) 05/11/2018   Essential hypertension 11/11/2016   Pneumonia 01/23/2015   Hypoxia 01/22/2015   Elevated troponin 01/22/2015   Acute cholecystitis 01/21/2015   S/P CABG (coronary artery bypass graft) 01/21/2015   Diabetes mellitus type 2 with complications ([place]) 16/96/7893   Sepsis ([place])    Valvular heart disease 09/29/2011   Syncope 01/21/2011   Coronary artery disease    Aortic insufficiency    Hypercholesteremia    Mitral valve regurgitation    PCP:  Donnajean Lopes, MD Pharmacy:   [facility], [place] [address] Phone: [phone] Fax: [phone]  [id number] [place] [place] [id number]: [phone]: [phone] [id number] [place] [id number] [id number]: [phone]: [phone]   Social Determinants of Health (SDOH) Interventions    Readmission Risk Interventions    09/29/2021   11:15 AM  Readmission Risk Prevention Plan  Post Dischage Appt Complete  Medication Screening Complete  Transportation Screening Complete

## 2021-09-29 NOTE — Progress Notes (Signed)
  Echocardiogram 2D Echocardiogram has been performed.  Nathan [name] 09/29/2021, 5:18 PM

## 2021-09-29 NOTE — TOC Progression Note (Signed)
Transition of Care [facility]) - Progression Note    Patient Details  Name: Nathan [name] MRN: [id number] Date of Birth: 01/23/1934  Transition of Care [facility]) CM/SW [place], RN Phone [phone]  09/29/2021, 9:46 AM  Clinical Narrative:    TOC acknowledges heart failure home health screen. Will round on patient, note to follow.         Expected Discharge Plan and Services                                                 Social Determinants of Health (SDOH) Interventions    Readmission Risk Interventions     No data to display

## 2021-09-29 NOTE — Progress Notes (Signed)
ANTICOAGULATION CONSULT NOTE  Pharmacy Consult for heparin Indication: chest pain/ACS  Allergies  Allergen Reactions   Flomax [Tamsulosin Hcl] Other (See Comments)    Dizzy   Lasix [Furosemide]     Dizziness   Silodosin Other (See Comments)    dizziness    Patient Measurements: Height: '5\' 11"'$  (180.3 cm) Weight: 70.9 kg (156 lb 4.9 oz) IBW/kg (Calculated) : 75.3 Heparin Dosing Weight: TBW  Vital Signs: Temp: 98.3 F (36.8 C) (08/15 0855) Temp Source: Oral (08/15 0855) BP: 162/73 (08/15 0855) Pulse Rate: 83 (08/15 0855)  Labs: Recent Labs    09/28/21 1627 09/28/21 1940 09/28/21 2121 09/29/21 0750  HGB 12.2*  --   --  13.4  HCT 37.1*  --   --  39.9  PLT 236  --   --  242  APTT  --   --   --  51*  HEPARINUNFRC  --   --   --  0.63  CREATININE 2.18*  --   --  2.10*  TROPONINIHS  --  2,318* 2,635*  --      Estimated Creatinine Clearance: 25.8 mL/min (A) (by C-G formula based on SCr of 2.1 mg/dL (H)).   Medical History: Past Medical History:  Diagnosis Date   Acid reflux    Aortic insufficiency    Cholecystitis    Combined systolic and diastolic heart failure ([place])    Echo 04/2018: inf-lat HK, EF 40-45, severe basal septal hypertrophy, mild conc LVH, Gr 1 DD, severe LAE, mild to mod TR, mod AI, asc Aorta 39 mm   Coronary artery disease 2002   CABG x5   Diabetes mellitus    DVT (deep venous thrombosis) ([facility])    previously on coumadin   Hypercholesteremia    Hyperlipidemia    Mitral valve regurgitation    Skin cancer    tumor removed off of his back    Assessment: 48 YOM presenting with abdominal pain, elevated troponin, hx of afib on Eliquis with last dose 8/14 AM.  Heparin level falsely elevated this AM from recent Eliquis, aPTT below goal.  No overt bleeding or complications noted.  No known issues with IV infusion.  Goal of Therapy:  Heparin level 0.3-0.7 units/ml aPTT 66-102 seconds Monitor platelets by anticoagulation protocol: Yes   Plan:   Increase heparin gtt to 1050 units/hr, no bolus F/u 8 hour aPTT/HL F/u cards eval and recs  Uvaldo Rising, BCPS, BCCP Clinical Pharmacist  09/29/2021 9:51 AM   [facility] pharmacy phone numbers are listed on [email or web address]

## 2021-09-29 NOTE — Consult Note (Addendum)
Cardiology Consultation:   Patient ID: Nathan [name] MRN: [id number]; DOB: [birth year]  Admit date: 09/28/2021 Date of Consult: 09/29/2021  PCP:  Donnajean Lopes, MD   [facility] Cardiologist:  [name], MD        Patient Profile:   Nathan [name] is a 88 y.o. male with a hx of CAD s/p CABG x 5 2002, aortic valve insufficiency and aortic stenosis, paroxysmal atrial fibrillation/flutter on chronic anticoagulation, severe TR, chronic combined CHF, chronic venous insufficiency, mitral valve regurgitation, CKD IV, DM, HLD and hypertension who is being seen 09/29/2021 for the evaluation of CHF and elevated troponin  at the request of Dr. Tamala Julian.  Last cath 03/2018 showed occluded vein to the diagonal branch and OM.  The vein to the PDA and PLA sequentially is widely patent and provides collaterals to the distal circumflex.  The LIMA backfills the diagonal branch.  Recommended medical therapy.   Admission March 2022 for gram-negative sepsis with acute hypoxic respiratory failure, peak hs troponin 5012 likely due to demand ischemia, echo 05/02/20 which revealed mildly reduced LVEF 40-45%, unable to determine diastolic function, mildly elevated pulmonary artery pressures, severe TR, degenerative MV with mild to moderate mitral stenosis and mild AI. Found to have gallstones, told he was not operative candidate. Dr. Acie Fredrickson advised conservative management of CAD and CHF at office visit on 05/13/20.    Cardiac monitor 12/30/20 revealed episodes of atrial flutter with a bundle branch block and he was started on metoprolol. He is also on Eliquis at a reduced dose. ED visit 03/12/21 for constipation and vomiting. Significant urinary retention > 900 cc. Foley cath was placed and he was noted to have severe prostatomegaly and advised to follow-up with urology.   Monitor 12/2020: episodes of atrial flutter with RVR  Admission 2/10-2/13/23 for cardiology consult for syncope thought to be orthostatic  in nature.    Follow-up event monitor 04/09/21 showed  Sinus rhythm including sinus tach, NSR, sinus bradycardia Frequent episodes of SVT - HR up to 179 Occasional episodes of atrial fib with RVR Rare episoes of nonsustained VT The episodes of dizziness occasionally correlated with his SVT but other episodes of dizziness were during sinus rhythm.  He was doing well from cardiac standpoint when last seen by Dr. Acie Fredrickson 08/03/2021.    History of Present Illness:   Nathan [name] presented to [facility] with abdominal discomfort radiating to bilateral arm.  Patient reported 3 weeks history of intermittent lower abdomen and epigastric/lower sternal pain.  Feels like discomfort and squeezing.  Reported shortness of breath with activity and bending.  Sometimes he needed to sit up to breathe better.  Each episode lasts for less than 5 minutes with self resolution.  Reports he has chronic Foley which was changed last Friday.  Cannot tell if he is urine has declined.  CT of abdomen without acute finding.  Chest x-ray cardiomegaly with pulmonary vascular congestion.  Troponin 2318>>2635.  Started heparin and transferred to [facility]. MRSA positive. UA concerning for UTI, pending culture. BNP 1791. He got IV lasix '80mg'$  x 1 yesterday, now on '40mg'$  BID.   SCr 2.18>>2.1 K 3.9  Past Medical History:  Diagnosis Date   Acid reflux    Aortic insufficiency    Cholecystitis    Combined systolic and diastolic heart failure ([place])    Echo 04/2018: inf-lat HK, EF 40-45, severe basal septal hypertrophy, mild conc LVH, Gr 1 DD, severe LAE, mild to mod TR, mod AI, asc  Aorta 39 mm   Coronary artery disease 2002   CABG x5   Diabetes mellitus    DVT (deep venous thrombosis) (HCC)    previously on coumadin   Hypercholesteremia    Hyperlipidemia    Mitral valve regurgitation    Skin cancer    tumor removed off of his back    Past Surgical History:  Procedure Laterality Date   BILIARY DRAINAGE  CATHETER PLACEMENT W/ BILE DUCT TUBE CHANGE  01/2015   CORONARY ARTERY BYPASS GRAFT  12/21/2000   x5   IR CATHETER TUBE CHANGE  06/18/2020   IR CHOLANGIOGRAM EXISTING TUBE  12/08/2020   IR CHOLANGIOGRAM EXISTING TUBE  12/16/2020   IR EXCHANGE BILIARY DRAIN  06/18/2020   IR EXCHANGE BILIARY DRAIN  08/13/2020   IR EXCHANGE BILIARY DRAIN  10/21/2020   IR EXCHANGE BILIARY DRAIN  11/20/2020   IR PERC CHOLECYSTOSTOMY  05/02/2020   IR RADIOLOGIST EVAL & MGMT  10/24/2020   IR RADIOLOGIST EVAL & MGMT  12/02/2020   IR REMOVAL OF CALCULI/DEBRIS BILIARY DUCT/GB  11/20/2020   IR US GUIDANCE  05/02/2020   LEFT HEART CATH AND CORS/GRAFTS ANGIOGRAPHY N/A 04/06/2018   Procedure: LEFT HEART CATH AND CORS/GRAFTS ANGIOGRAPHY;  Surgeon: Lorretta Harp, MD;  Location: [facility];  Service: Cardiovascular;  Laterality: N/A;   SKIN CANCER EXCISION     TUMOR REMOVAL     off of back, skin cancer     Inpatient Medications: Scheduled Meds:  Chlorhexidine Gluconate Cloth  6 each Topical Daily   [START ON 09/30/2021] fenofibrate  160 mg Oral Daily   [START ON 09/30/2021] finasteride  5 mg Oral Daily   furosemide  40 mg Intravenous BID   insulin aspart  0-6 Units Subcutaneous TID WC   insulin aspart protamine- aspart  10 Units Subcutaneous BID WC   [START ON 09/30/2021] magnesium oxide  400 mg Oral Daily   [START ON 09/30/2021] megestrol  20 mg Oral Daily   metoprolol tartrate  25 mg Oral BID   sodium bicarbonate  650 mg Oral BID   sodium chloride flush  3 mL Intravenous Q12H   Continuous Infusions:  heparin 1,050 Units/hr (09/29/21 1005)   magnesium sulfate bolus IVPB 4 g (09/29/21 1218)   PRN Meds: acetaminophen **OR** acetaminophen, albuterol, mouth rinse  Allergies:    Allergies  Allergen Reactions   Flomax [Tamsulosin Hcl] Other (See Comments)    Dizzy   Lasix [Furosemide]     Dizziness   Silodosin Other (See Comments)    dizziness    Social History:   Social History   Socioeconomic History    Marital status: Divorced    Spouse name: Not on file   Number of children: Not on file   Years of education: Not on file   Highest education level: Not on file  Occupational History   Not on file  Tobacco Use   Smoking status: Never   Smokeless tobacco: Never  Vaping Use   Vaping Use: Never used  Substance and Sexual Activity   Alcohol use: No   Drug use: No   Sexual activity: Not Currently  Other Topics Concern   Not on file  Social History Narrative   Not on file   Social Determinants of Health   Financial Resource Strain: Not on file  Food Insecurity: Not on file  Transportation Needs: Not on file  Physical Activity: Not on file  Stress: Not on file  Social Connections: Not on  file  Intimate Partner Violence: Not on file    Family History:    Family History  Problem Relation Age of Onset   Heart attack Father    Hypertension Mother    Diabetes Brother    Cancer Sister        breast   Cancer Brother        throat & mouth     ROS:  Please see the history of present illness.  All other ROS reviewed and negative.     Physical Exam/Data:   Vitals:   09/29/21 0400 09/29/21 0448 09/29/21 0855 09/29/21 1133  BP: (!) 158/83 (!) 167/67 (!) 162/73 (!) 153/80  Pulse: 73 74 83 79  Resp: (!) 26 (!) 22 20 (!) 23  Temp: 97.8 F (36.6 C) 97.8 F (36.6 C) 98.3 F (36.8 C) 97.7 F (36.5 C)  TempSrc: Oral Oral Oral Oral  SpO2: 95% 95% 93% 98%  Weight:  70.9 kg    Height:  '5\' 11"'$  (1.803 m)      Intake/Output Summary (Last 24 hours) at 09/29/2021 1235 Last data filed at 09/29/2021 1138 Gross per 24 hour  Intake 66.69 ml  Output 3200 ml  Net -3133.31 ml      09/29/2021    4:48 AM 09/28/2021    4:24 PM 08/03/2021   10:27 AM  Last 3 Weights  Weight (lbs) 156 lb 4.9 oz 165 lb 5.5 oz 165 lb 6.4 oz  Weight (kg) 70.9 kg 75 kg 75.025 kg     Body mass index is 21.8 kg/m.  General:  Well nourished, well developed, in no acute distress HEENT: normal Neck: no  JVD Vascular: No carotid bruits; Distal pulses 2+ bilaterally Cardiac:  normal S1, S2; RRR; no murmur  Lungs:  clear to auscultation bilaterally, no wheezing, rhonchi or rales  Abd: soft, nontender, no hepatomegaly  Ext: no edema Musculoskeletal:  No deformities, BUE and BLE strength normal and equal Skin: warm and dry  Neuro:  CNs 2-12 intact, no focal abnormalities noted Psych:  Normal affect   EKG:  The EKG was personally reviewed and demonstrates: Sinus rhythm, right bundle branch block Telemetry:  Telemetry was personally reviewed and demonstrates: Normal sinus rhythm  Relevant CV Studies:  Cath 03/2018 IMPRESSION: Mr. Saber has an occluded vein to the diagonal branch and OM.  The vein to the PDA and PLA sequentially is widely patent and provides collaterals to the distal circumflex.  The LIMA backfills the diagonal branch.  70 cc of contrast were used for the case.  I performed a femoral angiogram and MYNX closed the right common femoral puncture site.  Medical therapy will be recommended.  He will be discharged home later today as an outpatient and follow-up with Dr. Acie Fredrickson .  He can restart his Eliquis tomorrow. Diagnostic Dominance: Right   Echo 04/2020 1. Left ventricular ejection fraction, by estimation, is 40 to 45%. The  left ventricle has mildly decreased function. The left ventricle  demonstrates regional wall motion abnormalities (see scoring  diagram/findings for description). Left ventricular  diastolic parameters are indeterminate.   2. Right ventricular systolic function is mildly reduced. The right  ventricular size is mildly enlarged. There is mildly elevated pulmonary  artery systolic pressure. The IVC is not well seen, but using an estimated  RA pressure of 8, the estimated right  ventricular systolic pressure is 61.4 mmHg.   3. Left atrial size was moderately dilated.   4. Mitral regurgitation is anteriorly directed and  eccentric, and may be  underestimated.  There is a small flail segment best seen in parasternal  long axis views. . The mitral valve is degenerative. Mild to moderate  mitral valve regurgitation. No  evidence of mitral stenosis.   5. Tricuspid valve regurgitation is severe.   6. The aortic valve is abnormal. There is moderate calcification of the  aortic valve. Aortic valve regurgitation is mild to moderate. No aortic  stenosis is present.   Laboratory Data:  High Sensitivity Troponin:   Recent Labs  Lab 09/28/21 1940 09/28/21 2121  TROPONINIHS 2,318* 2,635*     Chemistry Recent Labs  Lab 09/28/21 1627 09/29/21 0750  NA 139 141  K 3.8 3.9  CL 110 110  CO2 21* 23  GLUCOSE 281* 233*  BUN 34* 29*  CREATININE 2.18* 2.10*  CALCIUM 9.3 9.6  MG  --  1.6*  GFRNONAA 29* 30*  ANIONGAP 8 8    Recent Labs  Lab 09/28/21 1627  PROT 6.8  ALBUMIN 3.2*  AST 51*  ALT 28  ALKPHOS 42  BILITOT 0.9   Lipids No results for input(s): "CHOL", "TRIG", "HDL", "LABVLDL", "LDLCALC", "CHOLHDL" in the last 168 hours.  Hematology Recent Labs  Lab 09/28/21 1627 09/29/21 0750  WBC 8.2 10.7*  RBC 4.04* 4.35  HGB 12.2* 13.4  HCT 37.1* 39.9  MCV 91.8 91.7  MCH 30.2 30.8  MCHC 32.9 33.6  RDW 13.5 13.5  PLT 236 242   Thyroid No results for input(s): "TSH", "FREET4" in the last 168 hours.  BNP Recent Labs  Lab 09/28/21 1627  BNP 1,791.6*    DDimer No results for input(s): "DDIMER" in the last 168 hours.   Radiology/Studies:  CT ABDOMEN PELVIS WO CONTRAST  Result Date: 09/28/2021 CLINICAL DATA:  Abdominal pain, acute, nonlocalized. 89 yo male. Endorses ABD Pain that is Mid ABD and began 2-3 Weeks ago. Worsened Today. Intermittent in [place]. Possible contstipation GFR=29 EXAM: CT ABDOMEN AND PELVIS WITHOUT CONTRAST TECHNIQUE: Multidetector CT imaging of the abdomen and pelvis was performed following the standard protocol without IV contrast. RADIATION DOSE REDUCTION: This exam was performed according to the departmental  dose-optimization program which includes automated exposure control, adjustment of the mA and/or kV according to patient size and/or use of iterative reconstruction technique. COMPARISON:  None Available. FINDINGS: Lower chest: Mild bronchial wall thickening. No acute abnormality. Coronary artery calcifications. Hiatal hernia containing mesenteric fat. Hepatobiliary: No focal liver abnormality. No gallstones, gallbladder wall thickening, or pericholecystic fluid. No biliary dilatation. Pancreas: No focal lesion. Normal pancreatic contour. No surrounding inflammatory changes. No main pancreatic ductal dilatation. Spleen: Normal in size without focal abnormality. Adrenals/Urinary Tract: No adrenal nodule bilaterally. No nephrolithiasis and no hydronephrosis. Query left parapelvic cyst (5:35). No ureterolithiasis or hydroureter. Foley catheter tip and balloon terminate within the urinary bladder lumen. The urinary bladder is decompressed and grossly unremarkable. Nonspecific foci of gas associated with the urinary bladder lumen. Stomach/Bowel: Stomach is within normal limits. No evidence of bowel wall thickening or dilatation. Diffuse colonic diverticulosis. Appendix appears normal. Vascular/Lymphatic: No abdominal aorta or iliac aneurysm. Severe atherosclerotic plaque of the aorta and its branches. No abdominal, pelvic, or inguinal lymphadenopathy. Reproductive: The prostate is enlarged measuring up to 5.5 cm. Other: No intraperitoneal free fluid. No intraperitoneal free gas. No organized fluid collection. Musculoskeletal: No abdominal wall hernia or abnormality. No suspicious lytic or blastic osseous lesions. No acute displaced fracture. Multilevel degenerative changes of the spine. Grade 2 anterolisthesis of L5 on S1 with associated  bilateral L5 pars interarticularis defects. Mild retrolisthesis of L4 on L5. IMPRESSION: 1. Small hiatal hernia with associated mesenteric fat. 2. Colonic diverticulosis with no acute  diverticulitis. 3. Grade 2 anterolisthesis of L5 on S1 with associated bilateral L5 pars interarticularis defects. 4. Prostatomegaly. 5.  Aortic Atherosclerosis (ICD10-I70.0). Electronically Signed   By: Iven Finn M.D.   On: 09/28/2021 21:19   DG Chest 2 View  Result Date: 09/28/2021 CLINICAL DATA:  Angina EXAM: CHEST - 2 VIEW COMPARISON:  Radiographs 05/07/2020 FINDINGS: No focal consolidation, pleural effusion, or pneumothorax. Borderline cardiomegaly. Sternotomy and CABG. Pulmonary vascular congestion. No acute osseous abnormality. IMPRESSION: Borderline cardiomegaly and pulmonary vascular congestion. Electronically Signed   By: Placido Sou M.D.   On: 09/28/2021 20:45     Assessment and Plan:   Systolic congestive heart failure Elevated troponin -3 weeks history of intermittent lower abdominal and epigastric/lower sternal pain.  Felt like increasing pressure.  Some orthopnea and dyspnea on exertion.  -Troponin 2318>>2635. -EKG with some non specific interior lateral changes  -Reports improved symptoms after IV Lasix.  Diuresed 3.1 L.  Suspecting symptoms due to volume overload but can not r/o angina.  -Continue IV Lasix 40 mg twice daily -Continue metoprolol tartrate 25 mg p.o. twice daily -Did not tolerated advanced therapy previously due to orthostasis - Agree with echo. Look for inferior wall. Will make NPO after MN.   3.  CAD -As above -Last cath 03/2018 showed occluded vein to the diagonal branch and OM.  The vein to the PDA and PLA sequentially is widely patent and provides collaterals to the distal circumflex.  The LIMA backfills the diagonal branch.  Recommended medical therapy.  -Continue beta-blocker  4. PAF - Maintaining sinus rhythm -Eliquis on hold. On heparin.  - Continue BB.   5. CKD IV - Scr stable at 2.1  Risk Assessment/Risk Scores:  { TIMI Risk Score for Unstable Angina or Non-ST Elevation MI:   The patient's TIMI risk score is 5, which indicates a  26% risk of all cause mortality, new or recurrent myocardial infarction or need for urgent revascularization in the next 14 days.{   CHA2DS2-VASc Score = 5   This indicates a 7.2% annual risk of stroke. The patient's score is based upon: CHF History: 1 HTN History: 0 Diabetes History: 1 Stroke History: 0 Vascular Disease History: 1 Age Score: 2 Gender Score: 0   For questions or updates, please contact [facility] Please consult [email or web address] for contact info under    Jarrett Soho, PA  09/29/2021 12:35 PM   I have seen and examined the patient along with Leanor Kail, PA.  I have reviewed the chart, notes and new data.  I agree with PA/NP's note.  Key new complaints: On my interview, I think he is describing several weeks of exertional angina and dyspnea, NYHA functional class II.  He could walk for about 1/4 mile before he developed epigastric and lower retrosternal tightness, radiating to both arms, as well as shortness of breath, with resolution of symptoms in about 10 minutes.  Yesterday he felt "very bad" and asked his daughter to bring him to the emergency room, although it is hard for him to say why yesterday was different from other days.  He is also having lower abdominal pain and may have an acute urinary tract infection (indwelling Foley catheter).  Symptoms have improved with diuretics.  He currently denies any discomfort in his chest, epigastrium or lower abdomen and does not have shortness  of breath. Key examination changes: Currently without overt signs of hypervolemia.  Normal JVD, no edema, clear lungs, no S3.  He has a widely split second heart sound, regular rate and rhythm. Key new findings / data: Troponin increased to over 2000, but not particularly dynamic pattern.  The ECG shows chronic bifascicular block (RBBB plus LAFB) as well as possibly slightly more obvious ST segment depression in leads V4-V6.  There is no evidence of ST segment  elevation.  PLAN: I think it is quite possible that his episode of heart failure exacerbation led to a small non-STEMI in the areas of vulnerable myocardium in the lateral and posterior walls, where his bypass grafts are fails.  Cannot entirely exclude that he has a new area of stenosis, may be involving his last remaining sequential vein graft to the inferior wall. Coronary angiography would clarify the situation, but would also put him at risk for acute kidney injury, maybe even progression to end-stage renal disease (in the setting of CKD stage IV and insulin requiring diabetes mellitus that is not well controlled). Before we commit to coronary angiography, recommend echocardiogram.  If there is a clear new area of wall motion abnormality in the inferior wall and/or LVEF has diminished further, would recommend proceeding to cardiac catheterization and potential revascularization procedure.  On the other hand, if LVEF is unchanged and there are no new wall motion abnormalities, would recommend a Lexiscan Myoview to risk stratify better.  Sanda Klein, MD, [place] [phone] 09/29/2021, 1:43 PM

## 2021-09-30 ENCOUNTER — Other Ambulatory Visit (HOSPITAL_COMMUNITY): Payer: Self-pay

## 2021-09-30 ENCOUNTER — Encounter (HOSPITAL_COMMUNITY)
Admission: EM | Disposition: A | Discharge status: Home / Self Care | Payer: Self-pay | Source: Home / Self Care | Attending: Internal Medicine

## 2021-09-30 DIAGNOSIS — N184 Chronic kidney disease, stage 4 (severe): Secondary | ICD-10-CM | POA: Diagnosis not present

## 2021-09-30 DIAGNOSIS — I2510 Atherosclerotic heart disease of native coronary artery without angina pectoris: Secondary | ICD-10-CM

## 2021-09-30 DIAGNOSIS — E118 Type 2 diabetes mellitus with unspecified complications: Secondary | ICD-10-CM | POA: Diagnosis not present

## 2021-09-30 DIAGNOSIS — I5020 Unspecified systolic (congestive) heart failure: Secondary | ICD-10-CM | POA: Diagnosis not present

## 2021-09-30 DIAGNOSIS — I214 Non-ST elevation (NSTEMI) myocardial infarction: Secondary | ICD-10-CM | POA: Diagnosis not present

## 2021-09-30 HISTORY — PX: LEFT HEART CATH AND CORS/GRAFTS ANGIOGRAPHY: CATH118250

## 2021-09-30 LAB — HEPARIN LEVEL (UNFRACTIONATED)
Heparin Unfractionated: 0.55 IU/mL (ref 0.30–0.70)
Heparin Unfractionated: <0.1 IU/mL — ABNORMAL LOW (ref 0.30–0.70)

## 2021-09-30 LAB — BASIC METABOLIC PANEL
Anion gap: 7 (ref 5–15)
BUN: 37 mg/dL — ABNORMAL HIGH (ref 8–23)
CO2: 23 mmol/L (ref 22–32)
Calcium: 9.3 mg/dL (ref 8.9–10.3)
Chloride: 105 mmol/L (ref 98–111)
Creatinine, Ser: 2.29 mg/dL — ABNORMAL HIGH (ref 0.61–1.24)
GFR, Estimated: 27 mL/min — ABNORMAL LOW (ref >60)
Glucose, Bld: 177 mg/dL — ABNORMAL HIGH (ref 70–99)
Potassium: 3.6 mmol/L (ref 3.5–5.1)
Sodium: 135 mmol/L (ref 135–145)

## 2021-09-30 LAB — APTT: aPTT: 82 seconds — ABNORMAL HIGH (ref 24–36)

## 2021-09-30 LAB — LIPID PANEL
Cholesterol: 129 mg/dL (ref 0–200)
HDL: 40 mg/dL — ABNORMAL LOW (ref >40)
LDL Cholesterol: 68 mg/dL (ref 0–99)
Total CHOL/HDL Ratio: 3.2 RATIO
Triglycerides: 106 mg/dL (ref <150)
VLDL: 21 mg/dL (ref 0–40)

## 2021-09-30 LAB — GLUCOSE, CAPILLARY
Glucose-Capillary: 314 mg/dL — ABNORMAL HIGH (ref 70–99)
Glucose-Capillary: 125 mg/dL — ABNORMAL HIGH (ref 70–99)
Glucose-Capillary: 244 mg/dL — ABNORMAL HIGH (ref 70–99)
Glucose-Capillary: 265 mg/dL — ABNORMAL HIGH (ref 70–99)

## 2021-09-30 LAB — CBC
HCT: 40.1 % (ref 39.0–52.0)
Hemoglobin: 14.1 g/dL (ref 13.0–17.0)
MCH: 31.1 pg (ref 26.0–34.0)
MCHC: 35.2 g/dL (ref 30.0–36.0)
MCV: 88.3 fL (ref 80.0–100.0)
Platelets: 243 10*3/uL (ref 150–400)
RBC: 4.54 MIL/uL (ref 4.22–5.81)
RDW: 13.2 % (ref 11.5–15.5)
WBC: 10.7 10*3/uL — ABNORMAL HIGH (ref 4.0–10.5)
nRBC: 0 % (ref 0.0–0.2)

## 2021-09-30 LAB — POCT ACTIVATED CLOTTING TIME: Activated Clotting Time: 131 seconds

## 2021-09-30 SURGERY — LEFT HEART CATH AND CORS/GRAFTS ANGIOGRAPHY
Anesthesia: LOCAL

## 2021-09-30 MED ORDER — HEPARIN (PORCINE) IN NACL 1000-0.9 UT/500ML-% IV SOLN
INTRAVENOUS | Status: DC | PRN
  Administered 2021-09-30 (×2): 500 mL

## 2021-09-30 MED ORDER — SODIUM CHLORIDE 0.9 % IV SOLN: INTRAVENOUS | Status: DC

## 2021-09-30 MED ORDER — HYDRALAZINE HCL 20 MG/ML IJ SOLN: 10.0000 mg | INTRAMUSCULAR | Status: AC | PRN

## 2021-09-30 MED ORDER — MIDAZOLAM HCL 2 MG/2ML IJ SOLN
INTRAMUSCULAR | Status: AC
  Filled 2021-09-30: qty 2

## 2021-09-30 MED ORDER — ASPIRIN 81 MG PO CHEW
81.0000 mg | CHEWABLE_TABLET | Freq: Every day | ORAL | Status: DC
  Administered 2021-09-30 – 2021-10-05 (×6): 81 mg via ORAL
  Filled 2021-09-30 (×6): qty 1

## 2021-09-30 MED ORDER — LIDOCAINE HCL (PF) 1 % IJ SOLN
INTRAMUSCULAR | Status: AC
  Filled 2021-09-30: qty 30

## 2021-09-30 MED ORDER — HEPARIN (PORCINE) IN NACL 1000-0.9 UT/500ML-% IV SOLN
INTRAVENOUS | Status: AC
  Filled 2021-09-30: qty 500

## 2021-09-30 MED ORDER — MIDAZOLAM HCL 2 MG/2ML IJ SOLN
INTRAMUSCULAR | Status: DC | PRN
  Administered 2021-09-30 (×2): .5 mg via INTRAVENOUS

## 2021-09-30 MED ORDER — SODIUM CHLORIDE 0.9 % IV SOLN: INTRAVENOUS | Status: AC

## 2021-09-30 MED ORDER — VERAPAMIL HCL 2.5 MG/ML IV SOLN
INTRAVENOUS | Status: AC
  Filled 2021-09-30: qty 2

## 2021-09-30 MED ORDER — HEPARIN (PORCINE) 25000 UT/250ML-% IV SOLN
1250.0000 [IU]/h | INTRAVENOUS | Status: DC
Start: 2021-09-30 — End: 2021-10-02
  Administered 2021-09-30 – 2021-10-01 (×2): 1100 [IU]/h via INTRAVENOUS
  Administered 2021-10-02: 1250 [IU]/h via INTRAVENOUS
  Filled 2021-09-30 (×2): qty 250

## 2021-09-30 MED ORDER — ONDANSETRON HCL 4 MG/2ML IJ SOLN: 4.0000 mg | Freq: Four times a day (QID) | INTRAMUSCULAR | Status: DC | PRN

## 2021-09-30 MED ORDER — SODIUM CHLORIDE 0.9% FLUSH
3.0000 mL | Freq: Two times a day (BID) | INTRAVENOUS | Status: DC
  Administered 2021-09-30 – 2021-10-02 (×2): 3 mL via INTRAVENOUS

## 2021-09-30 MED ORDER — HEPARIN SODIUM (PORCINE) 1000 UNIT/ML IJ SOLN
INTRAMUSCULAR | Status: AC
  Filled 2021-09-30: qty 10

## 2021-09-30 MED ORDER — POTASSIUM CHLORIDE CRYS ER 20 MEQ PO TBCR: 40.0000 meq | EXTENDED_RELEASE_TABLET | Freq: Once | ORAL | Status: DC

## 2021-09-30 MED ORDER — ACETAMINOPHEN 325 MG PO TABS: 650.0000 mg | ORAL_TABLET | ORAL | Status: DC | PRN

## 2021-09-30 MED ORDER — LIDOCAINE HCL (PF) 1 % IJ SOLN
INTRAMUSCULAR | Status: DC | PRN
  Administered 2021-09-30: 5 mL via INTRADERMAL

## 2021-09-30 MED ORDER — SODIUM CHLORIDE 0.9% FLUSH: 3.0000 mL | INTRAVENOUS | Status: DC | PRN

## 2021-09-30 MED ORDER — LABETALOL HCL 5 MG/ML IV SOLN: 10.0000 mg | INTRAVENOUS | Status: AC | PRN

## 2021-09-30 MED ORDER — IOHEXOL 350 MG/ML SOLN
INTRAVENOUS | Status: DC | PRN
  Administered 2021-09-30: 90 mL

## 2021-09-30 MED ORDER — POTASSIUM CHLORIDE CRYS ER 20 MEQ PO TBCR
40.0000 meq | EXTENDED_RELEASE_TABLET | Freq: Once | ORAL | Status: AC
  Administered 2021-09-30: 40 meq via ORAL
  Filled 2021-09-30: qty 2

## 2021-09-30 MED ORDER — SODIUM CHLORIDE 0.9 % IV SOLN: 250.0000 mL | INTRAVENOUS | Status: DC | PRN

## 2021-09-30 MED ORDER — ROSUVASTATIN CALCIUM 20 MG PO TABS
40.0000 mg | ORAL_TABLET | Freq: Every day | ORAL | Status: DC
  Administered 2021-09-30 – 2021-10-05 (×6): 40 mg via ORAL
  Filled 2021-09-30 (×6): qty 2

## 2021-09-30 SURGICAL SUPPLY — 12 items
CATH INFINITI 5 FR 3DRC (CATHETERS) ×2 IMPLANT
CATH INFINITI 5 FR IM (CATHETERS) ×2 IMPLANT
CATH INFINITI 5 FR RCB (CATHETERS) ×2 IMPLANT
CATH INFINITI 5FR MULTPACK ANG (CATHETERS) ×2 IMPLANT
KIT HEART LEFT (KITS) ×3 IMPLANT
PACK CARDIAC CATHETERIZATION (CUSTOM PROCEDURE TRAY) ×3 IMPLANT
SET INTRODUCER MICROPUNCT 5F (INTRODUCER) ×2 IMPLANT
SHEATH PINNACLE 5F 10CM (SHEATH) ×2 IMPLANT
SHEATH PROBE COVER 6X72 (BAG) ×2 IMPLANT
TRANSDUCER W/STOPCOCK (MISCELLANEOUS) ×3 IMPLANT
TUBING CIL FLEX 10 FLL-RA (TUBING) ×3 IMPLANT
WIRE EMERALD 3MM-J .035X150CM (WIRE) ×2 IMPLANT

## 2021-09-30 NOTE — Progress Notes (Signed)
ANTICOAGULATION CONSULT NOTE  Pharmacy Consult for heparin Indication: chest pain/ACS  Allergies  Allergen Reactions   Flomax [Tamsulosin Hcl] Other (See Comments)    Dizzy   Lasix [Furosemide]     Dizziness   Silodosin Other (See Comments)    dizziness    Patient Measurements: Height: '5\' 11"'$  (180.3 cm) Weight: 70.9 kg (156 lb 4.9 oz) IBW/kg (Calculated) : 75.3 Heparin Dosing Weight: TBW  Vital Signs: Temp: 98.2 F (36.8 C) (08/16 0351) Temp Source: Oral (08/16 0351) BP: 138/71 (08/16 0351) Pulse Rate: 79 (08/16 0351)  Labs: Recent Labs    09/28/21 1627 09/28/21 1940 09/28/21 2121 09/29/21 0750 09/29/21 1800 09/30/21 0316  HGB 12.2*  --   --  13.4  --  14.1  HCT 37.1*  --   --  39.9  --  40.1  PLT 236  --   --  242  --  243  APTT  --   --   --  51* 60* 82*  HEPARINUNFRC  --   --   --  0.63  --  0.55  CREATININE 2.18*  --   --  2.10*  --  2.29*  TROPONINIHS  --  2,318* 2,635*  --   --   --      Estimated Creatinine Clearance: 23.7 mL/min (A) (by C-G formula based on SCr of 2.29 mg/dL (H)).   Medical History: Past Medical History:  Diagnosis Date   Acid reflux    Aortic insufficiency    Cholecystitis    Combined systolic and diastolic heart failure ([place])    Echo 04/2018: inf-lat HK, EF 40-45, severe basal septal hypertrophy, mild conc LVH, Gr 1 DD, severe LAE, mild to mod TR, mod AI, asc Aorta 39 mm   Coronary artery disease 2002   CABG x5   Diabetes mellitus    DVT (deep venous thrombosis) (HCC)    previously on coumadin   Hypercholesteremia    Hyperlipidemia    Mitral valve regurgitation    Skin cancer    tumor removed off of his back    Assessment: 65 YOM presenting with abdominal pain, elevated troponin, hx of afib on Eliquis with last dose 8/14 AM.  Heparin level falsely elevated this AM from recent Eliquis, aPTT below goal.  No overt bleeding or complications noted.  No known issues with IV infusion.  8/16 AM update:  Heparin level  therapeutic  Correlating with aPTT's No need for further aPTT's  Goal of Therapy:  Heparin level 0.3-0.7 units/mL Monitor platelets by anticoagulation protocol: Yes   Plan:  Cont heparin 1100 units/hr 1200 heparin level  Narda Bonds, PharmD, [place] Pharmacist Phone: [phone]

## 2021-09-30 NOTE — Progress Notes (Signed)
Heart Failure Navigator Progress Note  Following this hospitalization to assess for HV TOC readiness.   Heart Cath 09/30/2021?   Earnestine Leys, BSN, Clinical cytogeneticist Only

## 2021-09-30 NOTE — H&P (View-Only) (Signed)
Progress Note  Patient Name: Nathan [name] Date of Encounter: 09/30/2021  [facility] Cardiologist: [name], MD   Subjective   No recurrent pain overnight. For cath later today, likely diagnostic only given renal function.   Inpatient Medications    Scheduled Meds:  Chlorhexidine Gluconate Cloth  6 each Topical Daily   finasteride  5 mg Oral Daily   insulin aspart  0-6 Units Subcutaneous TID WC   insulin aspart protamine- aspart  10 Units Subcutaneous BID WC   magnesium oxide  400 mg Oral Daily   megestrol  20 mg Oral Daily   metoprolol tartrate  25 mg Oral BID   pantoprazole  40 mg Oral Daily   rosuvastatin  10 mg Oral Daily   sodium bicarbonate  650 mg Oral BID   sodium chloride flush  3 mL Intravenous Q12H   sodium chloride flush  3 mL Intravenous Q12H   Continuous Infusions:  sodium chloride     sodium chloride 1 mL/kg/hr (09/30/21 0537)   heparin Stopped (09/30/21 0536)   PRN Meds: sodium chloride, acetaminophen **OR** acetaminophen, albuterol, mouth rinse, sodium chloride flush   Vital Signs    Vitals:   09/29/21 2000 09/29/21 2323 09/30/21 0351 09/30/21 0639  BP:  105/77 138/71   Pulse:  80 79   Resp: '19 16 17   '$ Temp: 97.8 F (36.6 C) 97.6 F (36.4 C) 98.2 F (36.8 C)   TempSrc: Oral Oral Oral   SpO2:  95% 97%   Weight:    69.1 kg  Height:        Intake/Output Summary (Last 24 hours) at 09/30/2021 0722 Last data filed at 09/30/2021 0537 Gross per 24 hour  Intake 826.43 ml  Output 3900 ml  Net -3073.57 ml      09/30/2021    6:39 AM 09/29/2021    4:48 AM 09/28/2021    4:24 PM  Last 3 Weights  Weight (lbs) 152 lb 5.4 oz 156 lb 4.9 oz 165 lb 5.5 oz  Weight (kg) 69.1 kg 70.9 kg 75 kg      Telemetry    SR, NSVT - Personally Reviewed  ECG    N/A  Physical Exam   GEN: No acute distress.   Neck: No JVD Cardiac: RRR, no murmurs, rubs, or gallops.  Respiratory: Clear to auscultation bilaterally. GI: Soft, nontender, non-distended   MS: No edema; No deformity. Neuro:  Nonfocal  Psych: Normal affect   Labs    High Sensitivity Troponin:   Recent Labs  Lab 09/28/21 1940 09/28/21 2121  TROPONINIHS 2,318* 2,635*     Chemistry Recent Labs  Lab 09/28/21 1627 09/29/21 0750 09/30/21 0316  NA 139 141 135  K 3.8 3.9 3.6  CL 110 110 105  CO2 21* 23 23  GLUCOSE 281* 233* 177*  BUN 34* 29* 37*  CREATININE 2.18* 2.10* 2.29*  CALCIUM 9.3 9.6 9.3  MG  --  1.6*  --   PROT 6.8  --   --   ALBUMIN 3.2*  --   --   AST 51*  --   --   ALT 28  --   --   ALKPHOS 42  --   --   BILITOT 0.9  --   --   GFRNONAA 29* 30* 27*  ANIONGAP '8 8 7    '$ Lipids No results for input(s): "CHOL", "TRIG", "HDL", "LABVLDL", "LDLCALC", "CHOLHDL" in the last 168 hours.  Hematology Recent Labs  Lab 09/28/21 1627 09/29/21 0750  09/30/21 0316  WBC 8.2 10.7* 10.7*  RBC 4.04* 4.35 4.54  HGB 12.2* 13.4 14.1  HCT 37.1* 39.9 40.1  MCV 91.8 91.7 88.3  MCH 30.2 30.8 31.1  MCHC 32.9 33.6 35.2  RDW 13.5 13.5 13.2  PLT 236 242 243   Thyroid No results for input(s): "TSH", "FREET4" in the last 168 hours.  BNP Recent Labs  Lab 09/28/21 1627  BNP 1,791.6*    DDimer No results for input(s): "DDIMER" in the last 168 hours.   Radiology    ECHOCARDIOGRAM COMPLETE  Result Date: 09/29/2021    ECHOCARDIOGRAM REPORT   Patient Name:   RUDOLPH DAOUST Date of Exam: 09/29/2021 Medical Rec #:  [id number]    Height:       71.0 in Accession #:    [id number]   Weight:       156.3 lb Date of Birth:  1933-02-21    BSA:          1.899 m Patient Age:    17 years     BP:           153/80 mmHg Patient Gender: M            HR:           78 bpm. Exam Location:  Inpatient Procedure: 2D Echo, Cardiac Doppler, Color Doppler and Intracardiac            Opacification Agent Indications:    CHF-acute systolic  History:        Patient has prior history of Echocardiogram examinations, most                 recent 04/26/2018. CHF, CAD, Prior CABG, Aortic Valve Disease and                  Mitral Valve Disease, Arrythmias:Atrial Fibrillation; Risk                 Factors:Diabetes and Dyslipidemia. Elevated troponin. CKD.  Sonographer:    Clayton Lefort RDCS (AE) Referring Phys: [id number] [facility] A SMITH  Sonographer Comments: No subcostal window. IMPRESSIONS  1. Left ventricular ejection fraction, by estimation, is 30 to 35%. The left ventricle has moderately decreased function. The left ventricle demonstrates regional wall motion abnormalities (see scoring diagram/findings for description). The left ventricular internal cavity size was mildly dilated. There is mild concentric left ventricular hypertrophy. Indeterminate diastolic filling due to E-A fusion.  2. Right ventricular systolic function is normal. The right ventricular size is normal.  3. Left atrial size was severely dilated.  4. The mitral valve is abnormal. Moderate to severe mitral valve regurgitation. No evidence of mitral stenosis.  5. The aortic valve is tricuspid. There is mild calcification of the aortic valve. There is mild thickening of the aortic valve. Aortic valve regurgitation is mild. Aortic valve sclerosis is present, with no evidence of aortic valve stenosis. Comparison(s): Changes from prior study are noted. The left ventricular function is worsened. FINDINGS  Left Ventricle: Left ventricular ejection fraction, by estimation, is 30 to 35%. The left ventricle has moderately decreased function. The left ventricle demonstrates regional wall motion abnormalities. Definity contrast agent was given IV to delineate the left ventricular endocardial borders. The left ventricular internal cavity size was mildly dilated. There is mild concentric left ventricular hypertrophy. Indeterminate diastolic filling due to E-A fusion.  LV Wall Scoring: The entire lateral wall is akinetic. Right Ventricle: The right ventricular size is normal. No increase in right ventricular wall thickness.  Right ventricular systolic function is normal. Left  Atrium: Left atrial size was severely dilated. Right Atrium: Right atrial size was normal in size. Pericardium: There is no evidence of pericardial effusion. Mitral Valve: The mitral valve is abnormal. Moderate to severe mitral valve regurgitation. No evidence of mitral valve stenosis. Tricuspid Valve: The tricuspid valve is grossly normal. Tricuspid valve regurgitation is mild . No evidence of tricuspid stenosis. Aortic Valve: The aortic valve is tricuspid. There is mild calcification of the aortic valve. There is mild thickening of the aortic valve. Aortic valve regurgitation is mild. Aortic regurgitation PHT measures 333 msec. Aortic valve sclerosis is present,  with no evidence of aortic valve stenosis. Aortic valve mean gradient measures 2.0 mmHg. Aortic valve peak gradient measures 3.2 mmHg. Aortic valve area, by VTI measures 2.79 cm. Pulmonic Valve: The pulmonic valve was grossly normal. Pulmonic valve regurgitation is not visualized. No evidence of pulmonic stenosis. Aorta: The aortic root and ascending aorta are structurally normal, with no evidence of dilitation. Venous: The inferior vena cava was not well visualized. IAS/Shunts: The atrial septum is grossly normal.  LEFT VENTRICLE PLAX 2D LVIDd:         5.00 cm LVIDs:         4.50 cm LV PW:         1.10 cm LV IVS:        1.20 cm LVOT diam:     2.10 cm LV SV:         50 LV SV Index:   26 LVOT Area:     3.46 cm  LV Volumes (MOD) LV vol d, MOD A4C: 168.0 ml LV vol s, MOD A4C: 131.0 ml LV SV MOD A4C:     168.0 ml RIGHT VENTRICLE RV S prime:     7.43 cm/s TAPSE (M-mode): 1.1 cm LEFT ATRIUM              Index        RIGHT ATRIUM           Index LA diam:        5.20 cm  2.74 cm/m   RA Area:     12.00 cm LA Vol (A2C):   94.7 ml  49.87 ml/m  RA Volume:   23.30 ml  12.27 ml/m LA Vol (A4C):   102.0 ml 53.72 ml/m LA Biplane Vol: 100.0 ml 52.66 ml/m  AORTIC VALVE AV Area (Vmax):    2.75 cm AV Area (Vmean):   2.66 cm AV Area (VTI):     2.79 cm AV Vmax:            89.80 cm/s AV Vmean:          63.100 cm/s AV VTI:            0.179 m AV Peak Grad:      3.2 mmHg AV Mean Grad:      2.0 mmHg LVOT Vmax:         71.30 cm/s LVOT Vmean:        48.500 cm/s LVOT VTI:          0.144 m LVOT/AV VTI ratio: 0.80 AI PHT:            333 msec  AORTA Ao Root diam: 3.90 cm Ao Asc diam:  4.00 cm TRICUSPID VALVE TR Peak grad:   15.8 mmHg TR Vmax:        199.00 cm/s  SHUNTS Systemic VTI:  0.14 m Systemic Diam: 2.10 cm  Eleonore Chiquito MD Electronically signed by Eleonore Chiquito MD Signature Date/Time: 09/29/2021/6:53:37 PM    Final    CT ABDOMEN PELVIS WO CONTRAST  Result Date: 09/28/2021 CLINICAL DATA:  Abdominal pain, acute, nonlocalized. 88 yo male. Endorses ABD Pain that is Mid ABD and began 2-3 Weeks ago. Worsened Today. Intermittent in [place]. Possible contstipation GFR=29 EXAM: CT ABDOMEN AND PELVIS WITHOUT CONTRAST TECHNIQUE: Multidetector CT imaging of the abdomen and pelvis was performed following the standard protocol without IV contrast. RADIATION DOSE REDUCTION: This exam was performed according to the departmental dose-optimization program which includes automated exposure control, adjustment of the mA and/or kV according to patient size and/or use of iterative reconstruction technique. COMPARISON:  None Available. FINDINGS: Lower chest: Mild bronchial wall thickening. No acute abnormality. Coronary artery calcifications. Hiatal hernia containing mesenteric fat. Hepatobiliary: No focal liver abnormality. No gallstones, gallbladder wall thickening, or pericholecystic fluid. No biliary dilatation. Pancreas: No focal lesion. Normal pancreatic contour. No surrounding inflammatory changes. No main pancreatic ductal dilatation. Spleen: Normal in size without focal abnormality. Adrenals/Urinary Tract: No adrenal nodule bilaterally. No nephrolithiasis and no hydronephrosis. Query left parapelvic cyst (5:35). No ureterolithiasis or hydroureter. Foley catheter tip and balloon terminate within  the urinary bladder lumen. The urinary bladder is decompressed and grossly unremarkable. Nonspecific foci of gas associated with the urinary bladder lumen. Stomach/Bowel: Stomach is within normal limits. No evidence of bowel wall thickening or dilatation. Diffuse colonic diverticulosis. Appendix appears normal. Vascular/Lymphatic: No abdominal aorta or iliac aneurysm. Severe atherosclerotic plaque of the aorta and its branches. No abdominal, pelvic, or inguinal lymphadenopathy. Reproductive: The prostate is enlarged measuring up to 5.5 cm. Other: No intraperitoneal free fluid. No intraperitoneal free gas. No organized fluid collection. Musculoskeletal: No abdominal wall hernia or abnormality. No suspicious lytic or blastic osseous lesions. No acute displaced fracture. Multilevel degenerative changes of the spine. Grade 2 anterolisthesis of L5 on S1 with associated bilateral L5 pars interarticularis defects. Mild retrolisthesis of L4 on L5. IMPRESSION: 1. Small hiatal hernia with associated mesenteric fat. 2. Colonic diverticulosis with no acute diverticulitis. 3. Grade 2 anterolisthesis of L5 on S1 with associated bilateral L5 pars interarticularis defects. 4. Prostatomegaly. 5.  Aortic Atherosclerosis (ICD10-I70.0). Electronically Signed   By: Iven Finn M.D.   On: 09/28/2021 21:19   DG Chest 2 View  Result Date: 09/28/2021 CLINICAL DATA:  Angina EXAM: CHEST - 2 VIEW COMPARISON:  Radiographs 05/07/2020 FINDINGS: No focal consolidation, pleural effusion, or pneumothorax. Borderline cardiomegaly. Sternotomy and CABG. Pulmonary vascular congestion. No acute osseous abnormality. IMPRESSION: Borderline cardiomegaly and pulmonary vascular congestion. Electronically Signed   By: Placido Sou M.D.   On: 09/28/2021 20:45    Cardiac Studies  Cardiac cath 2020  Ost LM to Dist LM lesion is 99% stenosed. Prox RCA lesion is 99% stenosed. Mid RCA lesion is 100% stenosed. Origin to Prox Graft lesion is 100%  stenosed. Origin to Prox Graft lesion is 100% stenosed. Ost Cx to Prox Cx lesion is 99% stenosed. Prox Cx lesion is 100% stenosed. Ost LAD to Prox LAD lesion is 100% stenosed. Diagnostic Dominance: Right  Echo 09/29/21  1. Left ventricular ejection fraction, by estimation, is 30 to 35%. The  left ventricle has moderately decreased function. The left ventricle  demonstrates regional wall motion abnormalities (see scoring  diagram/findings for description). The left  ventricular internal cavity size was mildly dilated. There is mild  concentric left ventricular hypertrophy. Indeterminate diastolic filling  due to E-A fusion.   2. Right ventricular systolic function  is normal. The right ventricular  size is normal.   3. Left atrial size was severely dilated.   4. The mitral valve is abnormal. Moderate to severe mitral valve  regurgitation. No evidence of mitral stenosis.   5. The aortic valve is tricuspid. There is mild calcification of the  aortic valve. There is mild thickening of the aortic valve. Aortic valve  regurgitation is mild. Aortic valve sclerosis is present, with no evidence  of aortic valve stenosis.   Comparison(s): Changes from prior study are noted. The left ventricular  function is worsened  Patient Profile     88 y.o. male with a hx of CAD s/p CABG x 5 2002, aortic valve insufficiency and aortic stenosis, paroxysmal atrial fibrillation/flutter on chronic anticoagulation, severe TR, chronic combined CHF, chronic venous insufficiency, mitral valve regurgitation, CKD IV, DM, HLD and hypertension presented with 3 weeks history of intermittent lower abdomen and epigastric/lower sternal pain (squeezing pressure) who found to have elevated troponin.   Assessment & Plan    NSTEMI - Troponin 2318>>2635.   - EKG with slightly more obvious ST depression in leads V4-V6 - Treated with heparin - Echo showed newly reduce LVEF with regional WM abnormality occlusion of the  sequential SVG to PDA/PLV. - Plan for cath today but likely diagnostic only given slight bump in renal function (still at baseline)  2. Acute on chronic systolic CHF - Symptomatic prior to arrival - BNP 1791 - Echo with LVEF of 30-35% , mild LVH, moderate to severe MR - Diuresed -4.8 L so far. Weight down 165>>152lb.  - Appears euvolemic - Continue lopressor (will consolidate to long acting prior to discharge) - GDMT as tolerated however will be limited given renal function   3. HLD - No results found for requested labs within last 365 days.  - Will check lipid panel  - Increase Crestor to '40mg'$  qd  4. CKD IIIb-IV - Scr 2.18 >>>2.1>>>2.29 (baseline 2-2.3) - Follow closely   5. DM - HgbA1c 7.3 - Per primary   For questions or updates, please contact [place] Please consult [email or web address] for contact info under        Signed, Leanor Kail, PA  09/30/2021, 7:22 AM     I have seen and examined the patient along with Leanor Kail, PA .  I have reviewed the chart, notes and new data.  I agree with PA/NP's note.  Key new complaints: no angina or dyspnea Key examination changes: clinically euvolemic Key new findings / data: 7-beat NSVT on telemetry, creat 2.2 at baseline. Echo reviewed - I think the new wall motion abnormality involves the inferior wall as well, most c/w stenosis or occlusion of both limbs of the sequential SVG to the PDA-PLV.  PLAN: Giving precath IV fluids - procedure at 1 PM. Suspect interval occlusion of sequential SVG to PDA-PLV, hopefully there is a revascularization option (but likely a staged approach due to CKD). This procedure has been fully reviewed with the patient and written informed consent has been obtained.   Sanda Klein, MD, [place] [phone] 09/30/2021, 8:53 AM

## 2021-09-30 NOTE — Plan of Care (Signed)
  Problem: Clinical Measurements: Goal: Respiratory complications will improve Outcome: Progressing Goal: Cardiovascular complication will be avoided Outcome: Progressing   Problem: Activity: Goal: Risk for activity intolerance will decrease Outcome: Progressing   Problem: Coping: Goal: Level of anxiety will decrease Outcome: Progressing   Problem: Elimination: Goal: Will not experience complications related to bowel motility Outcome: Progressing Goal: Will not experience complications related to urinary retention Outcome: Progressing   Problem: Pain Managment: Goal: General experience of comfort will improve Outcome: Progressing   

## 2021-09-30 NOTE — Progress Notes (Signed)
PROGRESS NOTE    Nathan [name]  BJS:[id number] DOB: 1933/10/20 DOA: 09/28/2021 PCP: Donnajean Lopes, MD    Brief Narrative:  87-year gentleman with history of chronic congestive heart failure, paroxysmal A-fib on Eliquis, aortic insufficiency, coronary artery disease status post CABG, hypertension hyperlipidemia type 2 diabetes and CKD stage IV presented to the emergency room with intermittent abdominal pain for about 3 weeks.  Patient has chronic indwelling Foley catheter that was changed 5 days ago.  In the emergency room hemodynamically stable.  BNP 1791.  High-sensitivity troponins 2318-2635.  Patient admitted with NSTEMI and cardiology consultation.   Assessment & Plan:   Non-ST elevation MI in a patient with known coronary artery disease: Currently chest pain-free.  Remains on heparin infusion.  On metoprolol 25 mg twice daily.  Crestor 40 mg daily.  Scheduled for cardiac cath today.  Acute on chronic systolic congestive heart failure: Symptomatic on arrival.  Elevated BNP.  Echocardiogram with known ejection fraction 30 to 35%.  Moderate to severe mitral regurgitation. Treated with IV diuresis, -4.8 L today.  Unable to tolerate GDMT.  Diuretics on hold today and gentle hydration for cardiac cath.  Euvolemic today.  CKD stage IV: Creatinine at about baseline.  Close follow-up on treatment, diuretics and cardiac cath.  Hyperlipidemia: On Crestor.  Type 2 diabetes: A1c 7.3.  Fairly controlled.  Resumed home dose of insulin.  Electrolytes: Magnesium and potassium replaced.  Adequate.  BPH with urinary retention/chronic indwelling Foley catheter: Stable.  Continue Proscar.  Paroxysmal A-fib: Currently sinus rhythm.  On Eliquis.  Rate controlled.  Eliquis replaced with heparin perioperative.   DVT prophylaxis:   Heparin infusion full   Code Status: full Code Family Communication: None Disposition Plan: Status is: Inpatient Remains inpatient appropriate because: Inpatient  cardiac procedures planned     Consultants:  Cardiology  Procedures:  Cath, plan  Antimicrobials:  None   Subjective: Patient seen and examined.  Denies any complaints.  Denies any chest pain.  Today he denies any shortness of breath on mobility with physical therapy.  He is aware about going to cardiac cath today.  Objective: Vitals:   09/29/21 2323 09/30/21 0351 09/30/21 0639 09/30/21 0727  BP: 105/77 138/71  124/69  Pulse: 80 79  74  Resp: 16 17    Temp: 97.6 F (36.4 C) 98.2 F (36.8 C)  97.8 F (36.6 C)  TempSrc: Oral Oral  Oral  SpO2: 95% 97%  96%  Weight:   69.1 kg   Height:        Intake/Output Summary (Last 24 hours) at 09/30/2021 1117 Last data filed at 09/30/2021 0537 Gross per 24 hour  Intake 826.43 ml  Output 3900 ml  Net -3073.57 ml   Filed Weights   09/28/21 1624 09/29/21 0448 09/30/21 0639  Weight: 75 kg 70.9 kg 69.1 kg    Examination:  General exam: Appears calm and comfortable, sitting in chair.  On room air. Respiratory system: No added sounds. Cardiovascular system: S1 & S2 heard, RRR. No pedal edema. Gastrointestinal system: Soft.  Nontender.  Bowel sound present.   Foley catheter with clear urine.   Central nervous system: Alert and oriented. No focal neurological deficits. Extremities: Symmetric 5 x 5 power.     Data Reviewed: I have personally reviewed following labs and imaging studies  CBC: Recent Labs  Lab 09/28/21 1627 09/29/21 0750 09/30/21 0316  WBC 8.2 10.7* 10.7*  HGB 12.2* 13.4 14.1  HCT 37.1* 39.9 40.1  MCV 91.8 91.7 88.3  PLT 236 242 263   Basic Metabolic Panel: Recent Labs  Lab 09/28/21 1627 09/29/21 0750 09/30/21 0316  NA 139 141 135  K 3.8 3.9 3.6  CL 110 110 105  CO2 21* 23 23  GLUCOSE 281* 233* 177*  BUN 34* 29* 37*  CREATININE 2.18* 2.10* 2.29*  CALCIUM 9.3 9.6 9.3  MG  --  1.6*  --    GFR: Estimated Creatinine Clearance: 23.1 mL/min (A) (by C-G formula based on SCr of 2.29 mg/dL  (H)). Liver Function Tests: Recent Labs  Lab 09/28/21 1627  AST 51*  ALT 28  ALKPHOS 42  BILITOT 0.9  PROT 6.8  ALBUMIN 3.2*   Recent Labs  Lab 09/28/21 1627  LIPASE 40   No results for input(s): "AMMONIA" in the last 168 hours. Coagulation Profile: No results for input(s): "INR", "PROTIME" in the last 168 hours. Cardiac Enzymes: No results for input(s): "CKTOTAL", "CKMB", "CKMBINDEX", "TROPONINI" in the last 168 hours. BNP (last 3 results) No results for input(s): "PROBNP" in the last 8760 hours. HbA1C: Recent Labs    09/29/21 0750  HGBA1C 7.3*   CBG: Recent Labs  Lab 09/29/21 1620 09/29/21 2132  GLUCAP 314* 210*   Lipid Profile: Recent Labs    09/30/21 0316  CHOL 129  HDL 40*  LDLCALC 68  TRIG 106  CHOLHDL 3.2   Thyroid Function Tests: No results for input(s): "TSH", "T4TOTAL", "FREET4", "T3FREE", "THYROIDAB" in the last 72 hours. Anemia Panel: No results for input(s): "VITAMINB12", "FOLATE", "FERRITIN", "TIBC", "IRON", "RETICCTPCT" in the last 72 hours. Sepsis Labs: No results for input(s): "PROCALCITON", "LATICACIDVEN" in the last 168 hours.  Recent Results (from the past 240 hour(s))  Urine Culture     Status: None (Preliminary result)   Collection Time: 09/28/21 10:48 PM   Specimen: Urine, Clean Catch  Result Value Ref Range Status   Specimen Description   Final    URINE, CLEAN CATCH Performed at [facility], [address] [place], [place] [address]    Special Requests   Final    NONE Performed at [facility], [address] [place], [place] [address]    Culture   Final    CULTURE REINCUBATED FOR BETTER GROWTH Performed at [facility], [place] [address] [place], [place] [address]    Report Status PENDING  Incomplete  MRSA Next Gen by PCR, Nasal     Status: Abnormal   Collection Time: 09/29/21  5:05 AM   Specimen: Nasal Mucosa; Nasal Swab  Result Value Ref Range Status   MRSA by PCR Next Gen DETECTED (A)  NOT DETECTED Final    Comment: CRITICAL RESULT CALLED TO, READ BACK BY AND VERIFIED WITH: C/ ATommie Raymond, RN 09/29/21 0625 A. LAFRANCE (NOTE) The GeneXpert MRSA Assay (FDA approved for NASAL specimens only), is one component of a comprehensive MRSA colonization surveillance program. It is not intended to diagnose MRSA infection nor to guide or monitor treatment for MRSA infections. Test performance is not FDA approved in patients less than 11 years old. Performed at [facility], [place] [address] [place], [place] [address]          Radiology Studies: ECHOCARDIOGRAM COMPLETE  Result Date: 09/29/2021    ECHOCARDIOGRAM REPORT   Patient Name:   COCHISE DINNEEN Date of Exam: 09/29/2021 Medical Rec #:  [id number]    Height:       71.0 in Accession #:    [id number]  Weight:       156.3 lb Date of Birth:  Feb 09, 1934    BSA:          1.899 m Patient Age:    19 years     BP:           153/80 mmHg Patient Gender: M            HR:           78 bpm. Exam Location:  Inpatient Procedure: 2D Echo, Cardiac Doppler, Color Doppler and Intracardiac            Opacification Agent Indications:    CHF-acute systolic  History:        Patient has prior history of Echocardiogram examinations, most                 recent 04/26/2018. CHF, CAD, Prior CABG, Aortic Valve Disease and                 Mitral Valve Disease, Arrythmias:Atrial Fibrillation; Risk                 Factors:Diabetes and Dyslipidemia. Elevated troponin. CKD.  Sonographer:    Clayton Lefort RDCS (AE) Referring Phys: [id number] [facility] A SMITH  Sonographer Comments: No subcostal window. IMPRESSIONS  1. Left ventricular ejection fraction, by estimation, is 30 to 35%. The left ventricle has moderately decreased function. The left ventricle demonstrates regional wall motion abnormalities (see scoring diagram/findings for description). The left ventricular internal cavity size was mildly dilated. There is mild concentric left ventricular hypertrophy. Indeterminate  diastolic filling due to E-A fusion.  2. Right ventricular systolic function is normal. The right ventricular size is normal.  3. Left atrial size was severely dilated.  4. The mitral valve is abnormal. Moderate to severe mitral valve regurgitation. No evidence of mitral stenosis.  5. The aortic valve is tricuspid. There is mild calcification of the aortic valve. There is mild thickening of the aortic valve. Aortic valve regurgitation is mild. Aortic valve sclerosis is present, with no evidence of aortic valve stenosis. Comparison(s): Changes from prior study are noted. The left ventricular function is worsened. FINDINGS  Left Ventricle: Left ventricular ejection fraction, by estimation, is 30 to 35%. The left ventricle has moderately decreased function. The left ventricle demonstrates regional wall motion abnormalities. Definity contrast agent was given IV to delineate the left ventricular endocardial borders. The left ventricular internal cavity size was mildly dilated. There is mild concentric left ventricular hypertrophy. Indeterminate diastolic filling due to E-A fusion.  LV Wall Scoring: The entire lateral wall is akinetic. Right Ventricle: The right ventricular size is normal. No increase in right ventricular wall thickness. Right ventricular systolic function is normal. Left Atrium: Left atrial size was severely dilated. Right Atrium: Right atrial size was normal in size. Pericardium: There is no evidence of pericardial effusion. Mitral Valve: The mitral valve is abnormal. Moderate to severe mitral valve regurgitation. No evidence of mitral valve stenosis. Tricuspid Valve: The tricuspid valve is grossly normal. Tricuspid valve regurgitation is mild . No evidence of tricuspid stenosis. Aortic Valve: The aortic valve is tricuspid. There is mild calcification of the aortic valve. There is mild thickening of the aortic valve. Aortic valve regurgitation is mild. Aortic regurgitation PHT measures 333 msec. Aortic  valve sclerosis is present,  with no evidence of aortic valve stenosis. Aortic valve mean gradient measures 2.0 mmHg. Aortic valve peak gradient measures 3.2 mmHg. Aortic valve area, by VTI measures 2.79 cm. Pulmonic  Valve: The pulmonic valve was grossly normal. Pulmonic valve regurgitation is not visualized. No evidence of pulmonic stenosis. Aorta: The aortic root and ascending aorta are structurally normal, with no evidence of dilitation. Venous: The inferior vena cava was not well visualized. IAS/Shunts: The atrial septum is grossly normal.  LEFT VENTRICLE PLAX 2D LVIDd:         5.00 cm LVIDs:         4.50 cm LV PW:         1.10 cm LV IVS:        1.20 cm LVOT diam:     2.10 cm LV SV:         50 LV SV Index:   26 LVOT Area:     3.46 cm  LV Volumes (MOD) LV vol d, MOD A4C: 168.0 ml LV vol s, MOD A4C: 131.0 ml LV SV MOD A4C:     168.0 ml RIGHT VENTRICLE RV S prime:     7.43 cm/s TAPSE (M-mode): 1.1 cm LEFT ATRIUM              Index        RIGHT ATRIUM           Index LA diam:        5.20 cm  2.74 cm/m   RA Area:     12.00 cm LA Vol (A2C):   94.7 ml  49.87 ml/m  RA Volume:   23.30 ml  12.27 ml/m LA Vol (A4C):   102.0 ml 53.72 ml/m LA Biplane Vol: 100.0 ml 52.66 ml/m  AORTIC VALVE AV Area (Vmax):    2.75 cm AV Area (Vmean):   2.66 cm AV Area (VTI):     2.79 cm AV Vmax:           89.80 cm/s AV Vmean:          63.100 cm/s AV VTI:            0.179 m AV Peak Grad:      3.2 mmHg AV Mean Grad:      2.0 mmHg LVOT Vmax:         71.30 cm/s LVOT Vmean:        48.500 cm/s LVOT VTI:          0.144 m LVOT/AV VTI ratio: 0.80 AI PHT:            333 msec  AORTA Ao Root diam: 3.90 cm Ao Asc diam:  4.00 cm TRICUSPID VALVE TR Peak grad:   15.8 mmHg TR Vmax:        199.00 cm/s  SHUNTS Systemic VTI:  0.14 m Systemic Diam: 2.10 cm Eleonore Chiquito MD Electronically signed by Eleonore Chiquito MD Signature Date/Time: 09/29/2021/6:53:37 PM    Final    CT ABDOMEN PELVIS WO CONTRAST  Result Date: 09/28/2021 CLINICAL DATA:  Abdominal  pain, acute, nonlocalized. 88 yo male. Endorses ABD Pain that is Mid ABD and began 2-3 Weeks ago. Worsened Today. Intermittent in [place]. Possible contstipation GFR=29 EXAM: CT ABDOMEN AND PELVIS WITHOUT CONTRAST TECHNIQUE: Multidetector CT imaging of the abdomen and pelvis was performed following the standard protocol without IV contrast. RADIATION DOSE REDUCTION: This exam was performed according to the departmental dose-optimization program which includes automated exposure control, adjustment of the mA and/or kV according to patient size and/or use of iterative reconstruction technique. COMPARISON:  None Available. FINDINGS: Lower chest: Mild bronchial wall thickening. No acute abnormality. Coronary artery calcifications. Hiatal hernia containing mesenteric fat.  Hepatobiliary: No focal liver abnormality. No gallstones, gallbladder wall thickening, or pericholecystic fluid. No biliary dilatation. Pancreas: No focal lesion. Normal pancreatic contour. No surrounding inflammatory changes. No main pancreatic ductal dilatation. Spleen: Normal in size without focal abnormality. Adrenals/Urinary Tract: No adrenal nodule bilaterally. No nephrolithiasis and no hydronephrosis. Query left parapelvic cyst (5:35). No ureterolithiasis or hydroureter. Foley catheter tip and balloon terminate within the urinary bladder lumen. The urinary bladder is decompressed and grossly unremarkable. Nonspecific foci of gas associated with the urinary bladder lumen. Stomach/Bowel: Stomach is within normal limits. No evidence of bowel wall thickening or dilatation. Diffuse colonic diverticulosis. Appendix appears normal. Vascular/Lymphatic: No abdominal aorta or iliac aneurysm. Severe atherosclerotic plaque of the aorta and its branches. No abdominal, pelvic, or inguinal lymphadenopathy. Reproductive: The prostate is enlarged measuring up to 5.5 cm. Other: No intraperitoneal free fluid. No intraperitoneal free gas. No organized fluid  collection. Musculoskeletal: No abdominal wall hernia or abnormality. No suspicious lytic or blastic osseous lesions. No acute displaced fracture. Multilevel degenerative changes of the spine. Grade 2 anterolisthesis of L5 on S1 with associated bilateral L5 pars interarticularis defects. Mild retrolisthesis of L4 on L5. IMPRESSION: 1. Small hiatal hernia with associated mesenteric fat. 2. Colonic diverticulosis with no acute diverticulitis. 3. Grade 2 anterolisthesis of L5 on S1 with associated bilateral L5 pars interarticularis defects. 4. Prostatomegaly. 5.  Aortic Atherosclerosis (ICD10-I70.0). Electronically Signed   By: Iven Finn M.D.   On: 09/28/2021 21:19   DG Chest 2 View  Result Date: 09/28/2021 CLINICAL DATA:  Angina EXAM: CHEST - 2 VIEW COMPARISON:  Radiographs 05/07/2020 FINDINGS: No focal consolidation, pleural effusion, or pneumothorax. Borderline cardiomegaly. Sternotomy and CABG. Pulmonary vascular congestion. No acute osseous abnormality. IMPRESSION: Borderline cardiomegaly and pulmonary vascular congestion. Electronically Signed   By: Placido Sou M.D.   On: 09/28/2021 20:45        Scheduled Meds:  Chlorhexidine Gluconate Cloth  6 each Topical Daily   finasteride  5 mg Oral Daily   insulin aspart  0-6 Units Subcutaneous TID WC   insulin aspart protamine- aspart  10 Units Subcutaneous BID WC   magnesium oxide  400 mg Oral Daily   megestrol  20 mg Oral Daily   metoprolol tartrate  25 mg Oral BID   pantoprazole  40 mg Oral Daily   rosuvastatin  40 mg Oral Daily   sodium bicarbonate  650 mg Oral BID   sodium chloride flush  3 mL Intravenous Q12H   sodium chloride flush  3 mL Intravenous Q12H   Continuous Infusions:  sodium chloride     sodium chloride 1 mL/kg/hr (09/30/21 0537)   heparin Stopped (09/30/21 0536)     LOS: 1 day    Time spent: 35 minutes    Barb Merino, MD [facility] Pager [phone]

## 2021-09-30 NOTE — Plan of Care (Signed)
  Problem: Clinical Measurements: Goal: Respiratory complications will improve Outcome: Progressing Goal: Cardiovascular complication will be avoided Outcome: Progressing   Problem: Activity: Goal: Risk for activity intolerance will decrease Outcome: Progressing   Problem: Coping: Goal: Level of anxiety will decrease Outcome: Progressing   Problem: Elimination: Goal: Will not experience complications related to urinary retention Outcome: Progressing   Problem: Pain Managment: Goal: General experience of comfort will improve Outcome: Progressing   

## 2021-09-30 NOTE — Progress Notes (Addendum)
Progress Note  Patient Name: Mehran Guderian Date of Encounter: 09/30/2021  [facility] Cardiologist: [name], MD   Subjective   No recurrent pain overnight. For cath later today, likely diagnostic only given renal function.   Inpatient Medications    Scheduled Meds:  Chlorhexidine Gluconate Cloth  6 each Topical Daily   finasteride  5 mg Oral Daily   insulin aspart  0-6 Units Subcutaneous TID WC   insulin aspart protamine- aspart  10 Units Subcutaneous BID WC   magnesium oxide  400 mg Oral Daily   megestrol  20 mg Oral Daily   metoprolol tartrate  25 mg Oral BID   pantoprazole  40 mg Oral Daily   rosuvastatin  10 mg Oral Daily   sodium bicarbonate  650 mg Oral BID   sodium chloride flush  3 mL Intravenous Q12H   sodium chloride flush  3 mL Intravenous Q12H   Continuous Infusions:  sodium chloride     sodium chloride 1 mL/kg/hr (09/30/21 0537)   heparin Stopped (09/30/21 0536)   PRN Meds: sodium chloride, acetaminophen **OR** acetaminophen, albuterol, mouth rinse, sodium chloride flush   Vital Signs    Vitals:   09/29/21 2000 09/29/21 2323 09/30/21 0351 09/30/21 0639  BP:  105/77 138/71   Pulse:  80 79   Resp: '19 16 17   '$ Temp: 97.8 F (36.6 C) 97.6 F (36.4 C) 98.2 F (36.8 C)   TempSrc: Oral Oral Oral   SpO2:  95% 97%   Weight:    69.1 kg  Height:        Intake/Output Summary (Last 24 hours) at 09/30/2021 0722 Last data filed at 09/30/2021 0537 Gross per 24 hour  Intake 826.43 ml  Output 3900 ml  Net -3073.57 ml      09/30/2021    6:39 AM 09/29/2021    4:48 AM 09/28/2021    4:24 PM  Last 3 Weights  Weight (lbs) 152 lb 5.4 oz 156 lb 4.9 oz 165 lb 5.5 oz  Weight (kg) 69.1 kg 70.9 kg 75 kg      Telemetry    SR, NSVT - Personally Reviewed  ECG    N/A  Physical Exam   GEN: No acute distress.   Neck: No JVD Cardiac: RRR, no murmurs, rubs, or gallops.  Respiratory: Clear to auscultation bilaterally. GI: Soft, nontender, non-distended   MS: No edema; No deformity. Neuro:  Nonfocal  Psych: Normal affect   Labs    High Sensitivity Troponin:   Recent Labs  Lab 09/28/21 1940 09/28/21 2121  TROPONINIHS 2,318* 2,635*     Chemistry Recent Labs  Lab 09/28/21 1627 09/29/21 0750 09/30/21 0316  NA 139 141 135  K 3.8 3.9 3.6  CL 110 110 105  CO2 21* 23 23  GLUCOSE 281* 233* 177*  BUN 34* 29* 37*  CREATININE 2.18* 2.10* 2.29*  CALCIUM 9.3 9.6 9.3  MG  --  1.6*  --   PROT 6.8  --   --   ALBUMIN 3.2*  --   --   AST 51*  --   --   ALT 28  --   --   ALKPHOS 42  --   --   BILITOT 0.9  --   --   GFRNONAA 29* 30* 27*  ANIONGAP '8 8 7    '$ Lipids No results for input(s): "CHOL", "TRIG", "HDL", "LABVLDL", "LDLCALC", "CHOLHDL" in the last 168 hours.  Hematology Recent Labs  Lab 09/28/21 1627 09/29/21 0750  09/30/21 0316  WBC 8.2 10.7* 10.7*  RBC 4.04* 4.35 4.54  HGB 12.2* 13.4 14.1  HCT 37.1* 39.9 40.1  MCV 91.8 91.7 88.3  MCH 30.2 30.8 31.1  MCHC 32.9 33.6 35.2  RDW 13.5 13.5 13.2  PLT 236 242 243   Thyroid No results for input(s): "TSH", "FREET4" in the last 168 hours.  BNP Recent Labs  Lab 09/28/21 1627  BNP 1,791.6*    DDimer No results for input(s): "DDIMER" in the last 168 hours.   Radiology    ECHOCARDIOGRAM COMPLETE  Result Date: 09/29/2021    ECHOCARDIOGRAM REPORT   Patient Name:   ADEOLUWA SILVERS Date of Exam: 09/29/2021 Medical Rec #:  [id number]    Height:       71.0 in Accession #:    [id number]   Weight:       156.3 lb Date of Birth:  1933-08-23    BSA:          1.899 m Patient Age:    88 years     BP:           153/80 mmHg Patient Gender: M            HR:           78 bpm. Exam Location:  Inpatient Procedure: 2D Echo, Cardiac Doppler, Color Doppler and Intracardiac            Opacification Agent Indications:    CHF-acute systolic  History:        Patient has prior history of Echocardiogram examinations, most                 recent 04/26/2018. CHF, CAD, Prior CABG, Aortic Valve Disease and                  Mitral Valve Disease, Arrythmias:Atrial Fibrillation; Risk                 Factors:Diabetes and Dyslipidemia. Elevated troponin. CKD.  Sonographer:    Clayton Lefort RDCS (AE) Referring Phys: [id number] [facility] A SMITH  Sonographer Comments: No subcostal window. IMPRESSIONS  1. Left ventricular ejection fraction, by estimation, is 30 to 35%. The left ventricle has moderately decreased function. The left ventricle demonstrates regional wall motion abnormalities (see scoring diagram/findings for description). The left ventricular internal cavity size was mildly dilated. There is mild concentric left ventricular hypertrophy. Indeterminate diastolic filling due to E-A fusion.  2. Right ventricular systolic function is normal. The right ventricular size is normal.  3. Left atrial size was severely dilated.  4. The mitral valve is abnormal. Moderate to severe mitral valve regurgitation. No evidence of mitral stenosis.  5. The aortic valve is tricuspid. There is mild calcification of the aortic valve. There is mild thickening of the aortic valve. Aortic valve regurgitation is mild. Aortic valve sclerosis is present, with no evidence of aortic valve stenosis. Comparison(s): Changes from prior study are noted. The left ventricular function is worsened. FINDINGS  Left Ventricle: Left ventricular ejection fraction, by estimation, is 30 to 35%. The left ventricle has moderately decreased function. The left ventricle demonstrates regional wall motion abnormalities. Definity contrast agent was given IV to delineate the left ventricular endocardial borders. The left ventricular internal cavity size was mildly dilated. There is mild concentric left ventricular hypertrophy. Indeterminate diastolic filling due to E-A fusion.  LV Wall Scoring: The entire lateral wall is akinetic. Right Ventricle: The right ventricular size is normal. No increase in right ventricular wall thickness.  Right ventricular systolic function is normal. Left  Atrium: Left atrial size was severely dilated. Right Atrium: Right atrial size was normal in size. Pericardium: There is no evidence of pericardial effusion. Mitral Valve: The mitral valve is abnormal. Moderate to severe mitral valve regurgitation. No evidence of mitral valve stenosis. Tricuspid Valve: The tricuspid valve is grossly normal. Tricuspid valve regurgitation is mild . No evidence of tricuspid stenosis. Aortic Valve: The aortic valve is tricuspid. There is mild calcification of the aortic valve. There is mild thickening of the aortic valve. Aortic valve regurgitation is mild. Aortic regurgitation PHT measures 333 msec. Aortic valve sclerosis is present,  with no evidence of aortic valve stenosis. Aortic valve mean gradient measures 2.0 mmHg. Aortic valve peak gradient measures 3.2 mmHg. Aortic valve area, by VTI measures 2.79 cm. Pulmonic Valve: The pulmonic valve was grossly normal. Pulmonic valve regurgitation is not visualized. No evidence of pulmonic stenosis. Aorta: The aortic root and ascending aorta are structurally normal, with no evidence of dilitation. Venous: The inferior vena cava was not well visualized. IAS/Shunts: The atrial septum is grossly normal.  LEFT VENTRICLE PLAX 2D LVIDd:         5.00 cm LVIDs:         4.50 cm LV PW:         1.10 cm LV IVS:        1.20 cm LVOT diam:     2.10 cm LV SV:         50 LV SV Index:   26 LVOT Area:     3.46 cm  LV Volumes (MOD) LV vol d, MOD A4C: 168.0 ml LV vol s, MOD A4C: 131.0 ml LV SV MOD A4C:     168.0 ml RIGHT VENTRICLE RV S prime:     7.43 cm/s TAPSE (M-mode): 1.1 cm LEFT ATRIUM              Index        RIGHT ATRIUM           Index LA diam:        5.20 cm  2.74 cm/m   RA Area:     12.00 cm LA Vol (A2C):   94.7 ml  49.87 ml/m  RA Volume:   23.30 ml  12.27 ml/m LA Vol (A4C):   102.0 ml 53.72 ml/m LA Biplane Vol: 100.0 ml 52.66 ml/m  AORTIC VALVE AV Area (Vmax):    2.75 cm AV Area (Vmean):   2.66 cm AV Area (VTI):     2.79 cm AV Vmax:            89.80 cm/s AV Vmean:          63.100 cm/s AV VTI:            0.179 m AV Peak Grad:      3.2 mmHg AV Mean Grad:      2.0 mmHg LVOT Vmax:         71.30 cm/s LVOT Vmean:        48.500 cm/s LVOT VTI:          0.144 m LVOT/AV VTI ratio: 0.80 AI PHT:            333 msec  AORTA Ao Root diam: 3.90 cm Ao Asc diam:  4.00 cm TRICUSPID VALVE TR Peak grad:   15.8 mmHg TR Vmax:        199.00 cm/s  SHUNTS Systemic VTI:  0.14 m Systemic Diam: 2.10 cm  Eleonore Chiquito MD Electronically signed by Eleonore Chiquito MD Signature Date/Time: 09/29/2021/6:53:37 PM    Final    CT ABDOMEN PELVIS WO CONTRAST  Result Date: 09/28/2021 CLINICAL DATA:  Abdominal pain, acute, nonlocalized. 88 yo male. Endorses ABD Pain that is Mid ABD and began 2-3 Weeks ago. Worsened Today. Intermittent in [place]. Possible contstipation GFR=29 EXAM: CT ABDOMEN AND PELVIS WITHOUT CONTRAST TECHNIQUE: Multidetector CT imaging of the abdomen and pelvis was performed following the standard protocol without IV contrast. RADIATION DOSE REDUCTION: This exam was performed according to the departmental dose-optimization program which includes automated exposure control, adjustment of the mA and/or kV according to patient size and/or use of iterative reconstruction technique. COMPARISON:  None Available. FINDINGS: Lower chest: Mild bronchial wall thickening. No acute abnormality. Coronary artery calcifications. Hiatal hernia containing mesenteric fat. Hepatobiliary: No focal liver abnormality. No gallstones, gallbladder wall thickening, or pericholecystic fluid. No biliary dilatation. Pancreas: No focal lesion. Normal pancreatic contour. No surrounding inflammatory changes. No main pancreatic ductal dilatation. Spleen: Normal in size without focal abnormality. Adrenals/Urinary Tract: No adrenal nodule bilaterally. No nephrolithiasis and no hydronephrosis. Query left parapelvic cyst (5:35). No ureterolithiasis or hydroureter. Foley catheter tip and balloon terminate within  the urinary bladder lumen. The urinary bladder is decompressed and grossly unremarkable. Nonspecific foci of gas associated with the urinary bladder lumen. Stomach/Bowel: Stomach is within normal limits. No evidence of bowel wall thickening or dilatation. Diffuse colonic diverticulosis. Appendix appears normal. Vascular/Lymphatic: No abdominal aorta or iliac aneurysm. Severe atherosclerotic plaque of the aorta and its branches. No abdominal, pelvic, or inguinal lymphadenopathy. Reproductive: The prostate is enlarged measuring up to 5.5 cm. Other: No intraperitoneal free fluid. No intraperitoneal free gas. No organized fluid collection. Musculoskeletal: No abdominal wall hernia or abnormality. No suspicious lytic or blastic osseous lesions. No acute displaced fracture. Multilevel degenerative changes of the spine. Grade 2 anterolisthesis of L5 on S1 with associated bilateral L5 pars interarticularis defects. Mild retrolisthesis of L4 on L5. IMPRESSION: 1. Small hiatal hernia with associated mesenteric fat. 2. Colonic diverticulosis with no acute diverticulitis. 3. Grade 2 anterolisthesis of L5 on S1 with associated bilateral L5 pars interarticularis defects. 4. Prostatomegaly. 5.  Aortic Atherosclerosis (ICD10-I70.0). Electronically Signed   By: Iven Finn M.D.   On: 09/28/2021 21:19   DG Chest 2 View  Result Date: 09/28/2021 CLINICAL DATA:  Angina EXAM: CHEST - 2 VIEW COMPARISON:  Radiographs 05/07/2020 FINDINGS: No focal consolidation, pleural effusion, or pneumothorax. Borderline cardiomegaly. Sternotomy and CABG. Pulmonary vascular congestion. No acute osseous abnormality. IMPRESSION: Borderline cardiomegaly and pulmonary vascular congestion. Electronically Signed   By: Placido Sou M.D.   On: 09/28/2021 20:45    Cardiac Studies  Cardiac cath 2020  Ost LM to Dist LM lesion is 99% stenosed. Prox RCA lesion is 99% stenosed. Mid RCA lesion is 100% stenosed. Origin to Prox Graft lesion is 100%  stenosed. Origin to Prox Graft lesion is 100% stenosed. Ost Cx to Prox Cx lesion is 99% stenosed. Prox Cx lesion is 100% stenosed. Ost LAD to Prox LAD lesion is 100% stenosed. Diagnostic Dominance: Right  Echo 09/29/21  1. Left ventricular ejection fraction, by estimation, is 30 to 35%. The  left ventricle has moderately decreased function. The left ventricle  demonstrates regional wall motion abnormalities (see scoring  diagram/findings for description). The left  ventricular internal cavity size was mildly dilated. There is mild  concentric left ventricular hypertrophy. Indeterminate diastolic filling  due to E-A fusion.   2. Right ventricular systolic function  is normal. The right ventricular  size is normal.   3. Left atrial size was severely dilated.   4. The mitral valve is abnormal. Moderate to severe mitral valve  regurgitation. No evidence of mitral stenosis.   5. The aortic valve is tricuspid. There is mild calcification of the  aortic valve. There is mild thickening of the aortic valve. Aortic valve  regurgitation is mild. Aortic valve sclerosis is present, with no evidence  of aortic valve stenosis.   Comparison(s): Changes from prior study are noted. The left ventricular  function is worsened  Patient Profile     88 y.o. male with a hx of CAD s/p CABG x 5 2002, aortic valve insufficiency and aortic stenosis, paroxysmal atrial fibrillation/flutter on chronic anticoagulation, severe TR, chronic combined CHF, chronic venous insufficiency, mitral valve regurgitation, CKD IV, DM, HLD and hypertension presented with 3 weeks history of intermittent lower abdomen and epigastric/lower sternal pain (squeezing pressure) who found to have elevated troponin.   Assessment & Plan    NSTEMI - Troponin 2318>>2635.   - EKG with slightly more obvious ST depression in leads V4-V6 - Treated with heparin - Echo showed newly reduce LVEF with regional WM abnormality occlusion of the  sequential SVG to PDA/PLV. - Plan for cath today but likely diagnostic only given slight bump in renal function (still at baseline)  2. Acute on chronic systolic CHF - Symptomatic prior to arrival - BNP 1791 - Echo with LVEF of 30-35% , mild LVH, moderate to severe MR - Diuresed -4.8 L so far. Weight down 165>>152lb.  - Appears euvolemic - Continue lopressor (will consolidate to long acting prior to discharge) - GDMT as tolerated however will be limited given renal function   3. HLD - No results found for requested labs within last 365 days.  - Will check lipid panel  - Increase Crestor to '40mg'$  qd  4. CKD IIIb-IV - Scr 2.18 >>>2.1>>>2.29 (baseline 2-2.3) - Follow closely   5. DM - HgbA1c 7.3 - Per primary   For questions or updates, please contact [place] Please consult [email or web address] for contact info under        Signed, Leanor Kail, PA  09/30/2021, 7:22 AM     I have seen and examined the patient along with Leanor Kail, PA .  I have reviewed the chart, notes and new data.  I agree with PA/NP's note.  Key new complaints: no angina or dyspnea Key examination changes: clinically euvolemic Key new findings / data: 7-beat NSVT on telemetry, creat 2.2 at baseline. Echo reviewed - I think the new wall motion abnormality involves the inferior wall as well, most c/w stenosis or occlusion of both limbs of the sequential SVG to the PDA-PLV.  PLAN: Giving precath IV fluids - procedure at 1 PM. Suspect interval occlusion of sequential SVG to PDA-PLV, hopefully there is a revascularization option (but likely a staged approach due to CKD). This procedure has been fully reviewed with the patient and written informed consent has been obtained.   Sanda Klein, MD, [place] [phone] 09/30/2021, 8:53 AM

## 2021-09-30 NOTE — Progress Notes (Signed)
Right groin/femoral site: dressing clean, dry with no drainage noted. Level zero, area soft and non-tender. Distal pulse present and strong.

## 2021-09-30 NOTE — Progress Notes (Signed)
SITE AREA: right groin/femoral  SITE PRIOR TO REMOVAL:  LEVEL 0  PRESSURE APPLIED FOR: approximately 25 minutes  MANUAL: yes  PATIENT STATUS DURING PULL: stable  POST PULL SITE:  LEVEL 0  POST PULL INSTRUCTIONS GIVEN: yes  POST PULL PULSES PRESENT: bilateral pedal pulse at +2  DRESSING APPLIED: gauze with tegaderm  BEDREST BEGINS @ 1886  COMMENTS:  sheath removed by Patric Dykes, 2H RN

## 2021-09-30 NOTE — Evaluation (Signed)
Physical Therapy Evaluation Patient Details Name: Nathan [name] MRN: [id number] DOB: [birth year] Today's Date: 09/30/2021  History of Present Illness  Pt is an 88 y.o. male admitted 09/28/21 with abdominal pain. CXR with cardiomegaly with pulmonary vascular congestion. Workup for acute on chronic HF, hiatal hernia. Abnormal echo 8/15, plan for coronary angiography 8/16. PMH includes CHF, CAD, DM, DVT, HLD, mitral valve regurgitation, orthostatic hypotension.   Clinical Impression  Pt presents with an overall decrease in functional mobility secondary to above. PTA, pt reports independent ambulating without DME, lives with daughter who does majority of driving and household tasks. Today, pt ambulatory without DME, close min guard due to bouts of instability. Pt preparing for cath today. Pt would benefit from continued acute PT services to maximize functional mobility and independence prior to d/c home.      Recommendations for follow up therapy are one component of a multi-disciplinary discharge planning process, led by the attending physician.  Recommendations may be updated based on patient status, additional functional criteria and insurance authorization.  Follow Up Recommendations No PT follow up      Assistance Recommended at Discharge Intermittent Supervision/Assistance  Patient can return home with the following  Assistance with cooking/housework;Assist for transportation;Help with stairs or ramp for entrance    Equipment Recommendations None recommended by PT  Recommendations for Other Services   Mobility Specialist   Functional Status Assessment Patient has had a recent decline in their functional status and demonstrates the ability to make significant improvements in function in a reasonable and predictable amount of time.     Precautions / Restrictions Precautions Precautions: Fall;Other (comment) Precaution Comments: chronic indwelling foley cath Restrictions Weight Bearing  Restrictions: No      Mobility  Bed Mobility Overal bed mobility: Modified Independent                  Transfers Overall transfer level: Needs assistance Equipment used: None Transfers: Sit to/from Stand Sit to Stand: Supervision                Ambulation/Gait Ambulation/Gait assistance: Min guard Gait Distance (Feet): 240 Feet Assistive device: None Gait Pattern/deviations: Step-through pattern, Decreased stride length, Trunk flexed Gait velocity: Decreased     General Gait Details: slow, mildly unsteady gait without DME, pt holding his foley cath, close min guard due to bouts of self-corrected instability  Stairs            Wheelchair Mobility    Modified Rankin (Stroke Patients Only)       Balance Overall balance assessment: Needs assistance   Sitting balance-[name]: Good     Standing balance support: No upper extremity supported, During functional activity Standing balance-[name]: Fair                               Pertinent Vitals/Pain Pain Assessment Pain Assessment: No/denies pain    Home Living Family/patient expects to be discharged to:: Private residence Living Arrangements: Children Available Help at Discharge: Family;Available 24 hours/day Type of Home: House Home Access: Ramped entrance       Home Layout: One level Home Equipment: Conservation officer, nature (2 wheels) Additional Comments: lives with daughter who is retired from work, available to assist as needed    Prior Function Prior Level of Function : Independent/Modified Independent;Driving             Mobility Comments: reports indep without DME; has driven 1x in past month,  but daughter does most of driving ADLs Comments: reports indep with ADLs, does bird baths at sink since having chronic indwelling foley cath. daughter performs majority of household tasks and cooking. daughter supervises med management     Hand Dominance         Extremity/Trunk Assessment   Upper Extremity Assessment Upper Extremity Assessment: Generalized weakness    Lower Extremity Assessment Lower Extremity Assessment: Generalized weakness    Cervical / Trunk Assessment Cervical / Trunk Assessment: Kyphotic  Communication   Communication: No difficulties  Cognition Arousal/Alertness: Awake/alert Behavior During Therapy: WFL for tasks assessed/performed Overall Cognitive Status: Within Functional Limits for tasks assessed                                 General Comments: WFL for simple tasks, not formally assessed        General Comments General comments (skin integrity, edema, etc.): SpO2 96% on RA with activity, HR 80s    Exercises     Assessment/Plan    PT Assessment Patient needs continued PT services  PT Problem List Decreased strength;Decreased activity tolerance;Decreased balance;Decreased mobility;Cardiopulmonary status limiting activity       PT Treatment Interventions DME instruction;Gait training;Functional mobility training;Therapeutic activities;Therapeutic exercise;Balance training;Patient/family education    PT Goals (Current goals can be found in the Care Plan section)  Acute Rehab PT Goals Patient Stated Goal: return home PT Goal Formulation: With patient Time For Goal Achievement: 10/14/21 Potential to Achieve Goals: Good    Frequency Min 3X/week     Co-evaluation               AM-PAC PT "6 Clicks" Mobility  Outcome Measure Help needed turning from your back to your side while in a flat bed without using bedrails?: None Help needed moving from lying on your back to sitting on the side of a flat bed without using bedrails?: None Help needed moving to and from a bed to a chair (including a wheelchair)?: A Little Help needed standing up from a chair using your arms (e.g., wheelchair or bedside chair)?: A Little Help needed to walk in hospital room?: A Little Help needed climbing  3-5 steps with a railing? : A Little 6 Click Score: 20    End of Session Equipment Utilized During Treatment: Gait belt Activity Tolerance: Patient tolerated treatment well Patient left: in chair;with call bell/phone within reach Nurse Communication: Mobility status PT Visit Diagnosis: Other abnormalities of gait and mobility (R26.89);Muscle weakness (generalized) (M62.81)    Time: 6301-6010 PT Time Calculation (min) (ACUTE ONLY): 15 min   Charges:   PT Evaluation $PT Eval Low Complexity: [place], PT, DPT Acute Rehabilitation Services  Personal: [facility] Office: [place] 09/30/2021, 10:08 AM

## 2021-09-30 NOTE — Interval H&P Note (Signed)
Cath Lab Visit (complete for each Cath Lab visit)  Clinical Evaluation Leading to the Procedure:   ACS: Yes.    Non-ACS:    Anginal Classification: CCS III  Anti-ischemic medical therapy: Minimal Therapy (1 class of medications)  Non-Invasive Test Results: No non-invasive testing performed  Prior CABG: Previous CABG      History and Physical Interval Note:  09/30/2021 11:52 AM  Nathan [name]  has presented today for surgery, with the diagnosis of heart failure.  The various methods of treatment have been discussed with the patient and family. After consideration of risks, benefits and other options for treatment, the patient has consented to  Procedure(s): LEFT HEART CATH AND CORS/GRAFTS ANGIOGRAPHY (N/A) as a surgical intervention.  The patient's history has been reviewed, patient examined, no change in status, stable for surgery.  I have reviewed the patient's chart and labs.  Questions were answered to the patient's satisfaction.     Shelva Majestic

## 2021-09-30 NOTE — Progress Notes (Addendum)
ANTICOAGULATION CONSULT NOTE  Pharmacy Consult for heparin Indication: chest pain/ACS  Allergies  Allergen Reactions   Flomax [Tamsulosin Hcl] Other (See Comments)    Dizzy   Lasix [Furosemide]     Dizziness   Silodosin Other (See Comments)    dizziness    Patient Measurements: Height: '5\' 11"'$  (180.3 cm) Weight: 69.1 kg (152 lb 5.4 oz) IBW/kg (Calculated) : 75.3 Heparin Dosing Weight: TBW  Vital Signs: Temp: 98 F (36.7 C) (08/16 1445) Temp Source: Oral (08/16 1413) BP: 123/81 (08/16 1500) Pulse Rate: 88 (08/16 1500)  Labs: Recent Labs    09/28/21 1627 09/28/21 1940 09/28/21 2121 09/29/21 0750 09/29/21 1800 09/30/21 0316  HGB 12.2*  --   --  13.4  --  14.1  HCT 37.1*  --   --  39.9  --  40.1  PLT 236  --   --  242  --  243  APTT  --   --   --  51* 60* 82*  HEPARINUNFRC  --   --   --  0.63  --  0.55  CREATININE 2.18*  --   --  2.10*  --  2.29*  TROPONINIHS  --  2,318* 2,635*  --   --   --      Estimated Creatinine Clearance: 23.1 mL/min (A) (by C-G formula based on SCr of 2.29 mg/dL (H)).   Medical History: Past Medical History:  Diagnosis Date   Acid reflux    Aortic insufficiency    Cholecystitis    Combined systolic and diastolic heart failure ([place])    Echo 04/2018: inf-lat HK, EF 40-45, severe basal septal hypertrophy, mild conc LVH, Gr 1 DD, severe LAE, mild to mod TR, mod AI, asc Aorta 39 mm   Coronary artery disease 2002   CABG x5   Diabetes mellitus    DVT (deep venous thrombosis) (HCC)    previously on coumadin   Hypercholesteremia    Hyperlipidemia    Mitral valve regurgitation    Skin cancer    tumor removed off of his back    Assessment: 59 YOM presenting with abdominal pain, elevated troponin, hx of afib on Eliquis with last dose 8/14 AM.  Heparin level falsely elevated this AM from recent Eliquis, aPTT below goal.  No overt bleeding or complications noted.  No known issues with IV infusion.  8/16 AM update:  Heparin level  therapeutic  Correlating with aPTT's No need for further aPTT's  09/30/2021 Patient is s/p cardiac cath. Sheath was removed at 12:45. Consult received to restart heparin 8 hours post-sheath removal (21:00)  Goal of Therapy:  Heparin level 0.3-0.7 units/mL Monitor platelets by anticoagulation protocol: Yes   Plan:  Restart heparin 1100 units/hr at 2100 on 8/16 HL w/ AM labs and daily thereafter. CBC daily Monitor for signs of bleeding, problems with IV infusion Follow-up with cardiology on plan to restart home AC (Eliquis 2.5 mg po bid)   Vaughan Basta BS, PharmD, BCPS Clinical Pharmacist 09/30/2021 3:39 PM  Contact: [phone] after 3 PM  "Be curious, not judgmental..." -Jamal Maes

## 2021-09-30 NOTE — Progress Notes (Signed)
Heart Failure Stewardship Pharmacist Progress Note   PCP: Donnajean Lopes, MD PCP-Cardiologist: [name], MD    HPI:  Nathan [name] 88 yo M presents to [facility] ED on 8/14 with intermittent abdominal pain for the past 3 weeks, described as indigestion symptoms. No complaints of chest discomfort. He has not been experiencing any n/v, but less frequent bowel movements. Standing up, belching, and rolaids helped improve his symptoms. He has noticed increased SOB with exertion and bending over. PMH significant for CHF, MI, HTN, CKD IV, T2DM, PAF, mitral regurgitation. Pt has a chronic indwelling foley catheter that was removed. Urine cx sent for evaluation.   CXR showed borderline cardiomegaly and pulmonary vascular congestion. ECHO demonstrated LVEF 30-35% (40-45% on 04/2020). Upon examination, pt has trace bilateral LEE. He was given Lasix 12m IV once and put out a liter of urine. He was then placed on Lasix 445mIV BID, but considered euvolemic and subsequently discontinued. Current HF Medications: Beta Blocker: metoprolol tartrate 25 mg BID  Prior to admission HF Medications: Beta blocker: Metoprolol tartrate 2524mID  Pertinent Lab Values: Serum creatinine 2.29, BUN 37, Potassium 3.6, Sodium 135, BNP 1791, Magnesium 1.6, A1c 7.3  Vital Signs: Weight: 152 lbs (admission weight: 165 lbs) Blood pressure: 105/77- 167/67 Heart rate: 73-97  I/O: -5.35L yesterday; net -4.8L  Medication Assistance / Insurance Benefits Check: Does the patient have prescription insurance?  Yes Type of insurance plan: [employer]  Does the patient qualify for medication assistance through manufacturers or grants?   Pending Eligible grants and/or patient assistance programs: pending Medication assistance applications in progress: none  Medication assistance applications approved: none Approved medication assistance renewals will be completed by: pending  Outpatient Pharmacy:  Prior to  admission [facility] Is the [facility] willing to [facility] at [facility]? Pending Is the patient willing to transition their outpatient pharmacy [facility]?   Pending    Assessment: 1. Acute on chronic systolic CHF (LVEF 30-93-81%due to ischemic causes from prior NSTEMI. NYHA class III symptoms. - Scr baseline level of 2-2.3, however, very labile. Currently on the upper end of baseline. Monitor closely to see if RAAS therapy could be initiated at a later time.  -Beta blocker therapy not currently ideal for HF management. Pt was on metoprolol IR formulation for Afib management prior to admission. Would be appropriate to  appropriate to switch to the ER formulation prior to discharge for heart failure benefit (mortality and hospitalizations). -SGLT2 therapy could be beneficial to initiate once UTI is ruled out. Jardiance 25m26mn be initiated with eGFR >20. Given the procedure scheduled later today and his CKD stage IV, it would be beneficial to hold off on SGLT2i initiation for a day or two and monitor his kidney function after the contrast is given -Spironolactone therapy not appropriate based on current eGFR. -Could reinitiate PO lasix for fluid management.   Plan: 1) Medication changes recommended at this time: -Goal K>4 and Mg >2. Recheck Mg tomorrow to see if replacement is needed with the discontinuation of diuretic therapy. -Strict I/Os -Daily standing weights  2) Patient assistance: -Entresto copay $40 -Jardiance copay $40  3)  Education  - To be completed prior to discharge  AllyLind GuestarmD PGY-1 [facility] Resident 09/30/2021 11:47 AM

## 2021-09-30 NOTE — Progress Notes (Signed)
Inpatient Diabetes Program Recommendations  AACE/ADA: New Consensus Statement on Inpatient Glycemic Control (2015)  Target Ranges:  Prepandial:   less than 140 mg/dL      Peak postprandial:   less than 180 mg/dL (1-2 hours)      Critically ill patients:  140 - 180 mg/dL   Lab Results  Component Value Date   GLUCAP 210 (H) 09/29/2021   HGBA1C 7.3 (H) 09/29/2021    Review of Glycemic Control  Latest Reference Range & Units 09/29/21 16:20 09/29/21 21:32  Glucose-Capillary 70 - 99 mg/dL 314 (H) 210 (H)   Diabetes history: DM 2 Outpatient Diabetes medications:  Novolin 70/30 10 units in AM and 20 units in the PM Current orders for Inpatient glycemic control:  Novolog 0-6 units tid with meals Novolog 70/30 10 units bid  Inpatient Diabetes Program Recommendations:    Note patient NPO for procedure.  Agree with holding 70/30 until diet resumed.   Need current CBG to assess glucose. Will ask RN.   Thanks,  Adah Perl, RN, BC-ADM Inpatient Diabetes Coordinator Pager [phone]  (8a-5p)

## 2021-10-01 ENCOUNTER — Encounter (HOSPITAL_COMMUNITY): Payer: Self-pay | Admitting: Cardiovascular Disease

## 2021-10-01 DIAGNOSIS — N184 Chronic kidney disease, stage 4 (severe): Secondary | ICD-10-CM | POA: Diagnosis not present

## 2021-10-01 DIAGNOSIS — I5020 Unspecified systolic (congestive) heart failure: Secondary | ICD-10-CM | POA: Diagnosis not present

## 2021-10-01 DIAGNOSIS — I214 Non-ST elevation (NSTEMI) myocardial infarction: Secondary | ICD-10-CM | POA: Diagnosis not present

## 2021-10-01 LAB — BASIC METABOLIC PANEL
Anion gap: 6 (ref 5–15)
BUN: 37 mg/dL — ABNORMAL HIGH (ref 8–23)
CO2: 20 mmol/L — ABNORMAL LOW (ref 22–32)
Calcium: 8.3 mg/dL — ABNORMAL LOW (ref 8.9–10.3)
Chloride: 108 mmol/L (ref 98–111)
Creatinine, Ser: 2.45 mg/dL — ABNORMAL HIGH (ref 0.61–1.24)
GFR, Estimated: 25 mL/min — ABNORMAL LOW (ref >60)
Glucose, Bld: 247 mg/dL — ABNORMAL HIGH (ref 70–99)
Potassium: 4.2 mmol/L (ref 3.5–5.1)
Sodium: 134 mmol/L — ABNORMAL LOW (ref 135–145)

## 2021-10-01 LAB — CBC
HCT: 35.3 % — ABNORMAL LOW (ref 39.0–52.0)
Hemoglobin: 12.2 g/dL — ABNORMAL LOW (ref 13.0–17.0)
MCH: 31.2 pg (ref 26.0–34.0)
MCHC: 34.6 g/dL (ref 30.0–36.0)
MCV: 90.3 fL (ref 80.0–100.0)
Platelets: 212 10*3/uL (ref 150–400)
RBC: 3.91 MIL/uL — ABNORMAL LOW (ref 4.22–5.81)
RDW: 13.8 % (ref 11.5–15.5)
WBC: 8.8 10*3/uL (ref 4.0–10.5)
nRBC: 0 % (ref 0.0–0.2)

## 2021-10-01 LAB — GLUCOSE, CAPILLARY
Glucose-Capillary: 154 mg/dL — ABNORMAL HIGH (ref 70–99)
Glucose-Capillary: 109 mg/dL — ABNORMAL HIGH (ref 70–99)
Glucose-Capillary: 160 mg/dL — ABNORMAL HIGH (ref 70–99)
Glucose-Capillary: 132 mg/dL — ABNORMAL HIGH (ref 70–99)

## 2021-10-01 LAB — HEPARIN LEVEL (UNFRACTIONATED)
Heparin Unfractionated: 0.19 IU/mL — ABNORMAL LOW (ref 0.30–0.70)
Heparin Unfractionated: 0.32 IU/mL (ref 0.30–0.70)

## 2021-10-01 LAB — URINE CULTURE: Culture: >=100000 — AB

## 2021-10-01 MED ORDER — SODIUM CHLORIDE 0.9 % IV SOLN: INTRAVENOUS | Status: DC

## 2021-10-01 MED ORDER — CLOPIDOGREL BISULFATE 75 MG PO TABS
75.0000 mg | ORAL_TABLET | Freq: Every day | ORAL | Status: DC
  Administered 2021-10-02 – 2021-10-05 (×4): 75 mg via ORAL
  Filled 2021-10-01 (×4): qty 1

## 2021-10-01 MED ORDER — SODIUM CHLORIDE 0.9 % IV SOLN: 250.0000 mL | INTRAVENOUS | Status: DC | PRN

## 2021-10-01 MED ORDER — SODIUM CHLORIDE 0.9% FLUSH: 3.0000 mL | INTRAVENOUS | Status: DC | PRN

## 2021-10-01 MED ORDER — SODIUM CHLORIDE 0.9% FLUSH
3.0000 mL | Freq: Two times a day (BID) | INTRAVENOUS | Status: DC
  Administered 2021-10-01 – 2021-10-05 (×3): 3 mL via INTRAVENOUS

## 2021-10-01 MED ORDER — CLOPIDOGREL BISULFATE 300 MG PO TABS
300.0000 mg | ORAL_TABLET | Freq: Once | ORAL | Status: AC
  Administered 2021-10-01: 300 mg via ORAL
  Filled 2021-10-01: qty 1

## 2021-10-01 NOTE — Progress Notes (Signed)
PROGRESS NOTE    Nathan [name]  WUJ:[id number] DOB: 1933/08/23 DOA: 09/28/2021 PCP: Donnajean Lopes, MD    Brief Narrative:  87-year gentleman with history of chronic congestive heart failure, paroxysmal A-fib on Eliquis, aortic insufficiency, coronary artery disease status post CABG, hypertension hyperlipidemia type 2 diabetes and CKD stage IV presented to the emergency room with intermittent abdominal pain for about 3 weeks.  Patient has chronic indwelling Foley catheter that was changed 5 days ago.  In the emergency room hemodynamically stable.  BNP 1791.  High-sensitivity troponins 2318-2635.  Patient admitted with NSTEMI and cardiology consultation. 8/16, underwent cardiac catheterization and found to have extensive coronary artery disease.  For a staged PCI if possible.     Assessment & Plan:   Non-ST elevation MI in a patient with known coronary artery disease: Currently chest pain-free.  Remains on heparin infusion.  On metoprolol 25 mg twice daily.  Crestor 40 mg daily.   Cardiac cath with high-grade stenosis in the functional SVG to PDA and PLB, decreased left ventricular ejection fraction. Started on Plavix, staged PCI to SVG plan for tomorrow.  Cardiology following.  Currently chest pain-free.  Acute on chronic systolic congestive heart failure: Symptomatic on arrival.  Elevated BNP.  Echocardiogram with known ejection fraction 30 to 35%.  Moderate to severe mitral regurgitation. Treated with IV diuresis, euvolemic.  Unable to tolerate GDMT.  Diuretics on hold to make room for contrasted studies.    CKD stage IV: Creatinine at about baseline. 2.3-2.7 in his outpatient reports.  Close follow-up on treatment, diuretics and cardiac cath.  Hyperlipidemia: On Crestor.  Type 2 diabetes: A1c 7.3.  Fairly controlled.  Resumed home dose of insulin.  Electrolytes: Magnesium and potassium replaced.  Adequate.  BPH with urinary retention/chronic indwelling Foley catheter: Stable.   Continue Proscar.  Paroxysmal A-fib: Currently sinus rhythm.  On Eliquis.  Rate controlled.  Eliquis replaced with heparin perioperative.  E. coli bacteriuria: Recent catheter exchange.  No evidence of systemic infection.  E. coli more than 100,000 colonies.  Will not treat with antibiotics.   DVT prophylaxis: SCD's Start: 09/30/21 1422  Heparin infusion    Code Status: full Code Family Communication: None Disposition Plan: Status is: Inpatient Remains inpatient appropriate because: Inpatient cardiac procedures planned     Consultants:  Cardiology  Procedures:  Cath, plan  Antimicrobials:  None   Subjective: Patient seen and examined.  No overnight events.  Denies any chest pain or shortness of breath.  More anxious about the procedure. Did not like the breakfast and is upset about that.  Objective: Vitals:   09/30/21 2343 10/01/21 0325 10/01/21 0614 10/01/21 0721  BP: 109/74 126/66  128/73  Pulse: 81 71  80  Resp: '20 13  19  '$ Temp: 98.2 F (36.8 C) 98 F (36.7 C)  98 F (36.7 C)  TempSrc: Oral Oral  Oral  SpO2: 91% 95%  92%  Weight:   70.2 kg   Height:        Intake/Output Summary (Last 24 hours) at 10/01/2021 0958 Last data filed at 10/01/2021 0800 Gross per 24 hour  Intake 540 ml  Output 450 ml  Net 90 ml   Filed Weights   09/29/21 0448 09/30/21 0639 10/01/21 0614  Weight: 70.9 kg 69.1 kg 70.2 kg    Examination:  General exam: Appears calm and comfortable, anxious. Respiratory system: No added sounds. Cardiovascular system: S1 & S2 heard, RRR. No pedal edema. Gastrointestinal system: Soft.  Nontender.  Bowel sound  present.   Foley catheter with clear urine.   Central nervous system: Alert and oriented. No focal neurological deficits. Extremities: Symmetric 5 x 5 power.     Data Reviewed: I have personally reviewed following labs and imaging studies  CBC: Recent Labs  Lab 09/28/21 1627 09/29/21 0750 09/30/21 0316 10/01/21 0044  WBC 8.2  10.7* 10.7* 8.8  HGB 12.2* 13.4 14.1 12.2*  HCT 37.1* 39.9 40.1 35.3*  MCV 91.8 91.7 88.3 90.3  PLT 236 242 243 270   Basic Metabolic Panel: Recent Labs  Lab 09/28/21 1627 09/29/21 0750 09/30/21 0316 10/01/21 0044  NA 139 141 135 134*  K 3.8 3.9 3.6 4.2  CL 110 110 105 108  CO2 21* 23 23 20*  GLUCOSE 281* 233* 177* 247*  BUN 34* 29* 37* 37*  CREATININE 2.18* 2.10* 2.29* 2.45*  CALCIUM 9.3 9.6 9.3 8.3*  MG  --  1.6*  --   --    GFR: Estimated Creatinine Clearance: 21.9 mL/min (A) (by C-G formula based on SCr of 2.45 mg/dL (H)). Liver Function Tests: Recent Labs  Lab 09/28/21 1627  AST 51*  ALT 28  ALKPHOS 42  BILITOT 0.9  PROT 6.8  ALBUMIN 3.2*   Recent Labs  Lab 09/28/21 1627  LIPASE 40   No results for input(s): "AMMONIA" in the last 168 hours. Coagulation Profile: No results for input(s): "INR", "PROTIME" in the last 168 hours. Cardiac Enzymes: No results for input(s): "CKTOTAL", "CKMB", "CKMBINDEX", "TROPONINI" in the last 168 hours. BNP (last 3 results) No results for input(s): "PROBNP" in the last 8760 hours. HbA1C: Recent Labs    09/29/21 0750  HGBA1C 7.3*   CBG: Recent Labs  Lab 09/29/21 2132 09/30/21 1325 09/30/21 1632 09/30/21 2129 10/01/21 0607  GLUCAP 210* 125* 244* 265* 154*   Lipid Profile: Recent Labs    09/30/21 0316  CHOL 129  HDL 40*  LDLCALC 68  TRIG 106  CHOLHDL 3.2   Thyroid Function Tests: No results for input(s): "TSH", "T4TOTAL", "FREET4", "T3FREE", "THYROIDAB" in the last 72 hours. Anemia Panel: No results for input(s): "VITAMINB12", "FOLATE", "FERRITIN", "TIBC", "IRON", "RETICCTPCT" in the last 72 hours. Sepsis Labs: No results for input(s): "PROCALCITON", "LATICACIDVEN" in the last 168 hours.  Recent Results (from the past 240 hour(s))  Urine Culture     Status: Abnormal   Collection Time: 09/28/21 10:48 PM   Specimen: Urine, Clean Catch  Result Value Ref Range Status   Specimen Description   Final     URINE, CLEAN CATCH Performed at [facility], [address] [place], [place] [address]    Special Requests   Final    NONE Performed at [facility], [address] [place], [place] [address]    Culture >=100,000 COLONIES/mL ESCHERICHIA COLI (A)  Final   Report Status 10/01/2021 FINAL  Final   Organism ID, Bacteria ESCHERICHIA COLI (A)  Final      Susceptibility   Escherichia coli - MIC*    AMPICILLIN >=32 RESISTANT Resistant     CEFAZOLIN <=4 SENSITIVE Sensitive     CEFEPIME <=0.12 SENSITIVE Sensitive     CEFTRIAXONE <=0.25 SENSITIVE Sensitive     CIPROFLOXACIN <=0.25 SENSITIVE Sensitive     GENTAMICIN <=1 SENSITIVE Sensitive     IMIPENEM <=0.25 SENSITIVE Sensitive     NITROFURANTOIN <=16 SENSITIVE Sensitive     TRIMETH/SULFA <=20 SENSITIVE Sensitive     AMPICILLIN/SULBACTAM 16 INTERMEDIATE Intermediate     PIP/TAZO <=4  SENSITIVE Sensitive     * >=100,000 COLONIES/mL ESCHERICHIA COLI  MRSA Next Gen by PCR, Nasal     Status: Abnormal   Collection Time: 09/29/21  5:05 AM   Specimen: Nasal Mucosa; Nasal Swab  Result Value Ref Range Status   MRSA by PCR Next Gen DETECTED (A) NOT DETECTED Final    Comment: CRITICAL RESULT CALLED TO, READ BACK BY AND VERIFIED WITH: C/ ATommie Raymond, RN 09/29/21 0625 A. LAFRANCE (NOTE) The GeneXpert MRSA Assay (FDA approved for NASAL specimens only), is one component of a comprehensive MRSA colonization surveillance program. It is not intended to diagnose MRSA infection nor to guide or monitor treatment for MRSA infections. Test performance is not FDA approved in patients less than 44 years old. Performed at [facility], [place] [address], [place], [place] [address]          Radiology Studies: CARDIAC CATHETERIZATION  Result Date: 09/30/2021   Mid LM to Dist LM lesion is 95% stenosed.   Prox LAD lesion is 95% stenosed.   Ost Cx to Prox Cx lesion is 95% stenosed.   Prox LAD to Mid LAD lesion is 99% stenosed.   1st  Mrg lesion is 99% stenosed.   Prox RCA to Mid RCA lesion is 100% stenosed.   Origin to Prox Graft lesion is 100% stenosed.   Origin lesion is 100% stenosed.   Origin lesion before RPDA  is 95% stenosed.   Mid LAD lesion is 70% stenosed.   Mid LAD to Dist LAD lesion is 70% stenosed. Severe multivessel CAD with 95% distal left main stenosis, 95 to 99% proximal LAD stenosis; 95-99% proximal circumflex stenosis before a diminutive OM1 vessel with 99% stenosis; and total occlusion of the proximal RCA after a proximal branch. Patent LIMA to mid LAD. Old occlusion of the SVG which had supplied the diagonal vessel. Old occlusion of the SVG which had supplied the circumflex marginal vessel. New 95% near ostial/proximal SVG graft stenosis in the sequential graft supplying the PDA and PLA vessel. LV EDP 6 mmHg. RECOMMENDATION: Patient recently presented with increasing chest pain.  Troponins have increased to 2635 most likely due to the new subtotal near ostial/proximal graft stenosis in the sequential graft supplying the PDA and PLA vessels.  The patient will be hydrated post procedure with his stage IV CKD.  We will have colleagues review and most likely will require attempt at staged PCI to the SVG supplying the PDA and PLA system of the RCA.    ECHOCARDIOGRAM COMPLETE  Result Date: 09/29/2021    ECHOCARDIOGRAM REPORT   Patient Name:   MD SMOLA Date of Exam: 09/29/2021 Medical Rec #:  [id number]    Height:       71.0 in Accession #:    [id number]   Weight:       156.3 lb Date of Birth:  31-Mar-1933    BSA:          1.899 m Patient Age:    28 years     BP:           153/80 mmHg Patient Gender: M            HR:           78 bpm. Exam Location:  Inpatient Procedure: 2D Echo, Cardiac Doppler, Color Doppler and Intracardiac            Opacification Agent Indications:    CHF-acute systolic  History:  Patient has prior history of Echocardiogram examinations, most                 recent 04/26/2018. CHF, CAD, Prior CABG,  Aortic Valve Disease and                 Mitral Valve Disease, Arrythmias:Atrial Fibrillation; Risk                 Factors:Diabetes and Dyslipidemia. Elevated troponin. CKD.  Sonographer:    Clayton Lefort RDCS (AE) Referring Phys: [id number] [facility] A SMITH  Sonographer Comments: No subcostal window. IMPRESSIONS  1. Left ventricular ejection fraction, by estimation, is 30 to 35%. The left ventricle has moderately decreased function. The left ventricle demonstrates regional wall motion abnormalities (see scoring diagram/findings for description). The left ventricular internal cavity size was mildly dilated. There is mild concentric left ventricular hypertrophy. Indeterminate diastolic filling due to E-A fusion.  2. Right ventricular systolic function is normal. The right ventricular size is normal.  3. Left atrial size was severely dilated.  4. The mitral valve is abnormal. Moderate to severe mitral valve regurgitation. No evidence of mitral stenosis.  5. The aortic valve is tricuspid. There is mild calcification of the aortic valve. There is mild thickening of the aortic valve. Aortic valve regurgitation is mild. Aortic valve sclerosis is present, with no evidence of aortic valve stenosis. Comparison(s): Changes from prior study are noted. The left ventricular function is worsened. FINDINGS  Left Ventricle: Left ventricular ejection fraction, by estimation, is 30 to 35%. The left ventricle has moderately decreased function. The left ventricle demonstrates regional wall motion abnormalities. Definity contrast agent was given IV to delineate the left ventricular endocardial borders. The left ventricular internal cavity size was mildly dilated. There is mild concentric left ventricular hypertrophy. Indeterminate diastolic filling due to E-A fusion.  LV Wall Scoring: The entire lateral wall is akinetic. Right Ventricle: The right ventricular size is normal. No increase in right ventricular wall thickness. Right ventricular  systolic function is normal. Left Atrium: Left atrial size was severely dilated. Right Atrium: Right atrial size was normal in size. Pericardium: There is no evidence of pericardial effusion. Mitral Valve: The mitral valve is abnormal. Moderate to severe mitral valve regurgitation. No evidence of mitral valve stenosis. Tricuspid Valve: The tricuspid valve is grossly normal. Tricuspid valve regurgitation is mild . No evidence of tricuspid stenosis. Aortic Valve: The aortic valve is tricuspid. There is mild calcification of the aortic valve. There is mild thickening of the aortic valve. Aortic valve regurgitation is mild. Aortic regurgitation PHT measures 333 msec. Aortic valve sclerosis is present,  with no evidence of aortic valve stenosis. Aortic valve mean gradient measures 2.0 mmHg. Aortic valve peak gradient measures 3.2 mmHg. Aortic valve area, by VTI measures 2.79 cm. Pulmonic Valve: The pulmonic valve was grossly normal. Pulmonic valve regurgitation is not visualized. No evidence of pulmonic stenosis. Aorta: The aortic root and ascending aorta are structurally normal, with no evidence of dilitation. Venous: The inferior vena cava was not well visualized. IAS/Shunts: The atrial septum is grossly normal.  LEFT VENTRICLE PLAX 2D LVIDd:         5.00 cm LVIDs:         4.50 cm LV PW:         1.10 cm LV IVS:        1.20 cm LVOT diam:     2.10 cm LV SV:         50 LV SV Index:  26 LVOT Area:     3.46 cm  LV Volumes (MOD) LV vol d, MOD A4C: 168.0 ml LV vol s, MOD A4C: 131.0 ml LV SV MOD A4C:     168.0 ml RIGHT VENTRICLE RV S prime:     7.43 cm/s TAPSE (M-mode): 1.1 cm LEFT ATRIUM              Index        RIGHT ATRIUM           Index LA diam:        5.20 cm  2.74 cm/m   RA Area:     12.00 cm LA Vol (A2C):   94.7 ml  49.87 ml/m  RA Volume:   23.30 ml  12.27 ml/m LA Vol (A4C):   102.0 ml 53.72 ml/m LA Biplane Vol: 100.0 ml 52.66 ml/m  AORTIC VALVE AV Area (Vmax):    2.75 cm AV Area (Vmean):   2.66 cm AV Area  (VTI):     2.79 cm AV Vmax:           89.80 cm/s AV Vmean:          63.100 cm/s AV VTI:            0.179 m AV Peak Grad:      3.2 mmHg AV Mean Grad:      2.0 mmHg LVOT Vmax:         71.30 cm/s LVOT Vmean:        48.500 cm/s LVOT VTI:          0.144 m LVOT/AV VTI ratio: 0.80 AI PHT:            333 msec  AORTA Ao Root diam: 3.90 cm Ao Asc diam:  4.00 cm TRICUSPID VALVE TR Peak grad:   15.8 mmHg TR Vmax:        199.00 cm/s  SHUNTS Systemic VTI:  0.14 m Systemic Diam: 2.10 cm Eleonore Chiquito MD Electronically signed by Eleonore Chiquito MD Signature Date/Time: 09/29/2021/6:53:37 PM    Final         Scheduled Meds:  aspirin  81 mg Oral Daily   Chlorhexidine Gluconate Cloth  6 each Topical Daily   clopidogrel  300 mg Oral Once   [START ON 10/02/2021] clopidogrel  75 mg Oral Daily   finasteride  5 mg Oral Daily   insulin aspart  0-6 Units Subcutaneous TID WC   insulin aspart protamine- aspart  10 Units Subcutaneous BID WC   magnesium oxide  400 mg Oral Daily   megestrol  20 mg Oral Daily   metoprolol tartrate  25 mg Oral BID   pantoprazole  40 mg Oral Daily   rosuvastatin  40 mg Oral Daily   sodium bicarbonate  650 mg Oral BID   sodium chloride flush  3 mL Intravenous Q12H   sodium chloride flush  3 mL Intravenous Q12H   sodium chloride flush  3 mL Intravenous Q12H   Continuous Infusions:  sodium chloride     heparin 1,100 Units/hr (10/01/21 0120)     LOS: 2 days    Time spent: 35 minutes    Barb Merino, MD [facility] Pager [phone]

## 2021-10-01 NOTE — Progress Notes (Signed)
Heart Failure Stewardship Pharmacist Progress Note   PCP: Donnajean Lopes, MD PCP-Cardiologist: [name], MD    HPI:  88 yo M with PMH of CHF, MI, HTN, CKD IV, T2DM, PAF, mitral regurgitation. He presented to [place] ED on 8/14 with intermittent abdominal pain for the past 3 weeks, described as indigestion symptoms. No complaints of chest discomfort. He has noticed increased SOB with exertion and bending over. Pt has a chronic indwelling foley catheter that was removed. Urine cx sent for evaluation, now growing >100k ecoli.   CXR showed borderline cardiomegaly and pulmonary vascular congestion. ECHO on 8/15 showed LVEF 30-35% (40-45% on 04/2020) with regional wall motion abnormalities, and mild LVH. Taken to cath on 8/16 and found to have severe multivessel CAD with planned PCI on 8/18.  Current HF Medications: Beta Blocker: metoprolol tartrate 25 mg BID  Prior to admission HF Medications: Beta blocker: Metoprolol tartrate '25mg'$  BID  Pertinent Lab Values: Serum creatinine 2.45, BUN 37, Potassium 4.2, Sodium 134, BNP 1791, Magnesium 1.6 (8/15), A1c 7.3  Vital Signs: Weight: 154 lbs (admission weight: 165 lbs) Blood pressure: 110-120/70s Heart rate: 70-80s I/O: -842m yesterday; net -4.7L  Medication Assistance / Insurance Benefits Check: Does the patient have prescription insurance?  Yes Type of insurance plan: [employer]  Does the patient qualify for medication assistance through manufacturers or grants?   Pending Eligible grants and/or patient assistance programs: pending Medication assistance applications in progress: none  Medication assistance applications approved: none Approved medication assistance renewals will be completed by: pending  Outpatient Pharmacy:  [facility]? Pending Is the patient willing to transition their outpatient pharmacy [facility]?   Pending    Assessment: 1. Acute on chronic systolic CHF (LVEF 339-76%, due to ischemic causes from prior NSTEMI. NYHA class III symptoms. - Not volume overloaded on exam. Weight up after receiving post-cath fluids. Monitor weights closely and may need to resume diuretics post PCI tomorrow.  - On metoprolol tartrate 25 mg BID, with new HFrEF, will need to consolidate to metoprolol XL prior to discharge - No ACE/ARB/ARNI or MRA with renal dysfunction. Creatinine up today.  - Catheter exchange showed >100k ecoli. Not treating per TRH. Consiser adding SGLT2i prior to discharge pending renal improvement.     Plan: 1) Medication changes recommended at this time: - Continue current regimen  2) Patient assistance: -Delene Lollcopay $40 - Jardiance copay $40  3)  Education  - To be completed prior to discharge MKerby Nora PharmD, BCPS Heart Failure Stewardship Pharmacist Phone ([phone] Please check [email or web address] for unit-specific pharmacist phone numbers

## 2021-10-01 NOTE — Progress Notes (Signed)
Mobility Specialist Progress Note    10/01/21 1534  Mobility  Activity Ambulated with assistance in hallway  Level of Assistance Contact guard assist, steadying assist  Assistive Device Other (Comment) (HHA)  Distance Ambulated (ft) 400 ft  Activity Response Tolerated well  $Mobility charge 1 Mobility   Pre-Mobility: 76 HR, 92% SpO2 During Mobility: 92 HR, 96% SpO2 Post-Mobility: 92 HR, 94% SpO2  Pt received in bed and agreeable. No complaints on walk. Returned to BR to attempt BM. Encouraged to pull string when ready for assistance.   Hildred Alamin Mobility Specialist

## 2021-10-01 NOTE — Progress Notes (Addendum)
Progress Note  Patient Name: Nathan [name] Date of Encounter: 10/01/2021  [facility] Cardiologist: [name], MD   Subjective   Denies angina and dyspnea at rest.  Lying fully supine in bed comfortably.  No problems at cardiac cath access site.  Inpatient Medications    Scheduled Meds:  aspirin  81 mg Oral Daily   Chlorhexidine Gluconate Cloth  6 each Topical Daily   finasteride  5 mg Oral Daily   insulin aspart  0-6 Units Subcutaneous TID WC   insulin aspart protamine- aspart  10 Units Subcutaneous BID WC   magnesium oxide  400 mg Oral Daily   megestrol  20 mg Oral Daily   metoprolol tartrate  25 mg Oral BID   pantoprazole  40 mg Oral Daily   rosuvastatin  40 mg Oral Daily   sodium bicarbonate  650 mg Oral BID   sodium chloride flush  3 mL Intravenous Q12H   sodium chloride flush  3 mL Intravenous Q12H   sodium chloride flush  3 mL Intravenous Q12H   Continuous Infusions:  sodium chloride     heparin 1,100 Units/hr (10/01/21 0120)   PRN Meds: sodium chloride, acetaminophen, albuterol, ondansetron (ZOFRAN) IV, mouth rinse, sodium chloride flush   Vital Signs    Vitals:   09/30/21 2343 10/01/21 0325 10/01/21 0614 10/01/21 0721  BP: 109/74 126/66  128/73  Pulse: 81 71  80  Resp: '20 13  19  '$ Temp: 98.2 F (36.8 C) 98 F (36.7 C)  98 F (36.7 C)  TempSrc: Oral Oral  Oral  SpO2: 91% 95%  92%  Weight:   70.2 kg   Height:        Intake/Output Summary (Last 24 hours) at 10/01/2021 0935 Last data filed at 10/01/2021 0800 Gross per 24 hour  Intake 540 ml  Output 450 ml  Net 90 ml      10/01/2021    6:14 AM 09/30/2021    6:39 AM 09/29/2021    4:48 AM  Last 3 Weights  Weight (lbs) 154 lb 12.2 oz 152 lb 5.4 oz 156 lb 4.9 oz  Weight (kg) 70.2 kg 69.1 kg 70.9 kg      Telemetry    Sinus rhythm, PVCs- Personally Reviewed  ECG    No new tracing- Personally Reviewed  Physical Exam  Lean.  Lying fully supine in bed without difficulty. GEN: No acute  distress.   Neck: No JVD Cardiac: RRR, no murmurs, rubs, or gallops.  Respiratory: Clear to auscultation bilaterally. GI: Soft, nontender, non-distended  MS: No edema; No deformity.  No evidence of bleeding or hematoma at the right femoral access site Neuro:  Nonfocal  Psych: Normal affect   Labs    High Sensitivity Troponin:   Recent Labs  Lab 09/28/21 1940 09/28/21 2121  TROPONINIHS 2,318* 2,635*     Chemistry Recent Labs  Lab 09/28/21 1627 09/29/21 0750 09/30/21 0316 10/01/21 0044  NA 139 141 135 134*  K 3.8 3.9 3.6 4.2  CL 110 110 105 108  CO2 21* 23 23 20*  GLUCOSE 281* 233* 177* 247*  BUN 34* 29* 37* 37*  CREATININE 2.18* 2.10* 2.29* 2.45*  CALCIUM 9.3 9.6 9.3 8.3*  MG  --  1.6*  --   --   PROT 6.8  --   --   --   ALBUMIN 3.2*  --   --   --   AST 51*  --   --   --  ALT 28  --   --   --   ALKPHOS 42  --   --   --   BILITOT 0.9  --   --   --   GFRNONAA 29* 30* 27* 25*  ANIONGAP '8 8 7 6    '$ Lipids  Recent Labs  Lab 09/30/21 0316  CHOL 129  TRIG 106  HDL 40*  LDLCALC 68  CHOLHDL 3.2    Hematology Recent Labs  Lab 09/29/21 0750 09/30/21 0316 10/01/21 0044  WBC 10.7* 10.7* 8.8  RBC 4.35 4.54 3.91*  HGB 13.4 14.1 12.2*  HCT 39.9 40.1 35.3*  MCV 91.7 88.3 90.3  MCH 30.8 31.1 31.2  MCHC 33.6 35.2 34.6  RDW 13.5 13.2 13.8  PLT 242 243 212   Thyroid No results for input(s): "TSH", "FREET4" in the last 168 hours.  BNP Recent Labs  Lab 09/28/21 1627  BNP 1,791.6*    DDimer No results for input(s): "DDIMER" in the last 168 hours.   Radiology    CARDIAC CATHETERIZATION  Result Date: 09/30/2021   Mid LM to Dist LM lesion is 95% stenosed.   Prox LAD lesion is 95% stenosed.   Ost Cx to Prox Cx lesion is 95% stenosed.   Prox LAD to Mid LAD lesion is 99% stenosed.   1st Mrg lesion is 99% stenosed.   Prox RCA to Mid RCA lesion is 100% stenosed.   Origin to Prox Graft lesion is 100% stenosed.   Origin lesion is 100% stenosed.   Origin lesion before  RPDA  is 95% stenosed.   Mid LAD lesion is 70% stenosed.   Mid LAD to Dist LAD lesion is 70% stenosed. Severe multivessel CAD with 95% distal left main stenosis, 95 to 99% proximal LAD stenosis; 95-99% proximal circumflex stenosis before a diminutive OM1 vessel with 99% stenosis; and total occlusion of the proximal RCA after a proximal branch. Patent LIMA to mid LAD. Old occlusion of the SVG which had supplied the diagonal vessel. Old occlusion of the SVG which had supplied the circumflex marginal vessel. New 95% near ostial/proximal SVG graft stenosis in the sequential graft supplying the PDA and PLA vessel. LV EDP 6 mmHg. RECOMMENDATION: Patient recently presented with increasing chest pain.  Troponins have increased to 2635 most likely due to the new subtotal near ostial/proximal graft stenosis in the sequential graft supplying the PDA and PLA vessels.  The patient will be hydrated post procedure with his stage IV CKD.  We will have colleagues review and most likely will require attempt at staged PCI to the SVG supplying the PDA and PLA system of the RCA.    ECHOCARDIOGRAM COMPLETE  Result Date: 09/29/2021    ECHOCARDIOGRAM REPORT   Patient Name:   Nathan [name] Date of Exam: 09/29/2021 Medical Rec #:  [id number]    Height:       71.0 in Accession #:    [id number]   Weight:       156.3 lb Date of Birth:  August 09, 1933    BSA:          1.899 m Patient Age:    8 years     BP:           153/80 mmHg Patient Gender: M            HR:           78 bpm. Exam Location:  Inpatient Procedure: 2D Echo, Cardiac Doppler, Color Doppler and Intracardiac  Opacification Agent Indications:    CHF-acute systolic  History:        Patient has prior history of Echocardiogram examinations, most                 recent 04/26/2018. CHF, CAD, Prior CABG, Aortic Valve Disease and                 Mitral Valve Disease, Arrythmias:Atrial Fibrillation; Risk                 Factors:Diabetes and Dyslipidemia. Elevated troponin. CKD.   Sonographer:    Clayton Lefort RDCS (AE) Referring Phys: [id number] [facility] A SMITH  Sonographer Comments: No subcostal window. IMPRESSIONS  1. Left ventricular ejection fraction, by estimation, is 30 to 35%. The left ventricle has moderately decreased function. The left ventricle demonstrates regional wall motion abnormalities (see scoring diagram/findings for description). The left ventricular internal cavity size was mildly dilated. There is mild concentric left ventricular hypertrophy. Indeterminate diastolic filling due to E-A fusion.  2. Right ventricular systolic function is normal. The right ventricular size is normal.  3. Left atrial size was severely dilated.  4. The mitral valve is abnormal. Moderate to severe mitral valve regurgitation. No evidence of mitral stenosis.  5. The aortic valve is tricuspid. There is mild calcification of the aortic valve. There is mild thickening of the aortic valve. Aortic valve regurgitation is mild. Aortic valve sclerosis is present, with no evidence of aortic valve stenosis. Comparison(s): Changes from prior study are noted. The left ventricular function is worsened. FINDINGS  Left Ventricle: Left ventricular ejection fraction, by estimation, is 30 to 35%. The left ventricle has moderately decreased function. The left ventricle demonstrates regional wall motion abnormalities. Definity contrast agent was given IV to delineate the left ventricular endocardial borders. The left ventricular internal cavity size was mildly dilated. There is mild concentric left ventricular hypertrophy. Indeterminate diastolic filling due to E-A fusion.  LV Wall Scoring: The entire lateral wall is akinetic. Right Ventricle: The right ventricular size is normal. No increase in right ventricular wall thickness. Right ventricular systolic function is normal. Left Atrium: Left atrial size was severely dilated. Right Atrium: Right atrial size was normal in size. Pericardium: There is no evidence of  pericardial effusion. Mitral Valve: The mitral valve is abnormal. Moderate to severe mitral valve regurgitation. No evidence of mitral valve stenosis. Tricuspid Valve: The tricuspid valve is grossly normal. Tricuspid valve regurgitation is mild . No evidence of tricuspid stenosis. Aortic Valve: The aortic valve is tricuspid. There is mild calcification of the aortic valve. There is mild thickening of the aortic valve. Aortic valve regurgitation is mild. Aortic regurgitation PHT measures 333 msec. Aortic valve sclerosis is present,  with no evidence of aortic valve stenosis. Aortic valve mean gradient measures 2.0 mmHg. Aortic valve peak gradient measures 3.2 mmHg. Aortic valve area, by VTI measures 2.79 cm. Pulmonic Valve: The pulmonic valve was grossly normal. Pulmonic valve regurgitation is not visualized. No evidence of pulmonic stenosis. Aorta: The aortic root and ascending aorta are structurally normal, with no evidence of dilitation. Venous: The inferior vena cava was not well visualized. IAS/Shunts: The atrial septum is grossly normal.  LEFT VENTRICLE PLAX 2D LVIDd:         5.00 cm LVIDs:         4.50 cm LV PW:         1.10 cm LV IVS:        1.20 cm LVOT diam:  2.10 cm LV SV:         50 LV SV Index:   26 LVOT Area:     3.46 cm  LV Volumes (MOD) LV vol d, MOD A4C: 168.0 ml LV vol s, MOD A4C: 131.0 ml LV SV MOD A4C:     168.0 ml RIGHT VENTRICLE RV S prime:     7.43 cm/s TAPSE (M-mode): 1.1 cm LEFT ATRIUM              Index        RIGHT ATRIUM           Index LA diam:        5.20 cm  2.74 cm/m   RA Area:     12.00 cm LA Vol (A2C):   94.7 ml  49.87 ml/m  RA Volume:   23.30 ml  12.27 ml/m LA Vol (A4C):   102.0 ml 53.72 ml/m LA Biplane Vol: 100.0 ml 52.66 ml/m  AORTIC VALVE AV Area (Vmax):    2.75 cm AV Area (Vmean):   2.66 cm AV Area (VTI):     2.79 cm AV Vmax:           89.80 cm/s AV Vmean:          63.100 cm/s AV VTI:            0.179 m AV Peak Grad:      3.2 mmHg AV Mean Grad:      2.0 mmHg LVOT  Vmax:         71.30 cm/s LVOT Vmean:        48.500 cm/s LVOT VTI:          0.144 m LVOT/AV VTI ratio: 0.80 AI PHT:            333 msec  AORTA Ao Root diam: 3.90 cm Ao Asc diam:  4.00 cm TRICUSPID VALVE TR Peak grad:   15.8 mmHg TR Vmax:        199.00 cm/s  SHUNTS Systemic VTI:  0.14 m Systemic Diam: 2.10 cm Eleonore Chiquito MD Electronically signed by Eleonore Chiquito MD Signature Date/Time: 09/29/2021/6:53:37 PM    Final     Cardiac Studies   Cardiac catheterization 09/30/2021  Severe multivessel CAD with 95% distal left main stenosis, 95 to 99% proximal LAD stenosis; 95-99% proximal circumflex stenosis before a diminutive OM1 vessel with 99% stenosis; and total occlusion of the proximal RCA after a proximal branch.   Patent LIMA to mid LAD.   Old occlusion of the SVG which had supplied the diagonal vessel.   Old occlusion of the SVG which had supplied the circumflex marginal vessel.   New 95% near ostial/proximal SVG graft stenosis in the sequential graft supplying the PDA and PLA vessel.   LV EDP 6 mmHg.   RECOMMENDATION: Patient recently presented with increasing chest pain.  Troponins have increased to 2635 most likely due to the new subtotal near ostial/proximal graft stenosis in the sequential graft supplying the PDA and PLA vessels.  The patient will be hydrated post procedure with his stage IV CKD.  We will have colleagues review and most likely will require attempt at staged PCI to the SVG supplying the PDA and PLA system of the RCA.    Diagnostic Dominance: Right     Echocardiogram 09/29/2021   1. Left ventricular ejection fraction, by estimation, is 30 to 35%. The  left ventricle has moderately decreased function. The left ventricle  demonstrates regional wall motion abnormalities (  see scoring  diagram/findings for description). The left  ventricular internal cavity size was mildly dilated. There is mild  concentric left ventricular hypertrophy. Indeterminate diastolic filling   due to E-A fusion.   2. Right ventricular systolic function is normal. The right ventricular  size is normal.   3. Left atrial size was severely dilated.   4. The mitral valve is abnormal. Moderate to severe mitral valve  regurgitation. No evidence of mitral stenosis.   5. The aortic valve is tricuspid. There is mild calcification of the  aortic valve. There is mild thickening of the aortic valve. Aortic valve  regurgitation is mild. Aortic valve sclerosis is present, with no evidence  of aortic valve stenosis.   Comparison(s): Changes from prior study are noted. The left ventricular  function is worsened  Patient Profile     88 y.o. male with a hx of CAD s/p CABG x 5 2002, aortic valve insufficiency and borderline aortic stenosis, paroxysmal atrial fibrillation/flutter on chronic anticoagulation, severe TR, chronic combined CHF, chronic venous insufficiency, mitral valve regurgitation, CKD IV, DM, HLD and hypertension presented with 3 weeks history of intermittent lower abdomen and epigastric/lower sternal pain (squeezing pressure) consistent with unstable angina/non-STEMI (peak troponin 2635), cardiac catheterization shows known chronic occlusion of SVG-OM and SVG-diagonal, with new 95% stenosis of the SVG-PDA-PLV and patent LIMA to LAD, echo shows interval marked decrease in LVEF to 30-35% with new inferior lateral wall motion abnormalities Assessment & Plan    CHF: Appears clinically euvolemic.  Diuretics on hold to allow cardiac catheterization in the setting of advanced CKD.  Not on RAAS inhibitors for the same reason.  Plan to change to metoprolol succinate at discharge. CAD/NSTEMI: New high-grade stenosis in the functional SVG to PDA and PLV explains new wall motion abnormalities and decrease in LVEF.  Elevation in troponin is relatively mild compared with the extent of myocardium in jeopardy, hopefully this means that there is substantial viable myocardium that can be salvaged.   Planned staged PCI to SVG tomorrow if renal function allows.  We will start clopidogrel (preferred over Brilinta due to lower bleeding risk; plan for him to go back on direct oral anticoagulant once catheter based procedures are complete). CKD 4: Chronic moderate to severe renal dysfunction with creatinine baseline around 2.0-2.3, increased to 2.45 today.  No plan for contrast based procedures today.  Hold diuretics.  Recheck renal function parameters before planned PCI-SVG tomorrow.  Avoid all other potential nephrotoxic agents. HLP: Maximum dose rosuvastatin.  LDL less than 70.  Fenofibrate discontinued due to increased risk of toxicity with renal insufficiency. DM: Fair glycemic control with hemoglobin A1c 7.3% Parox Afib: Maintaining sinus rhythm at this time.  On chronic Eliquis 2.5 mg twice daily.     For questions or updates, please contact [place] Please consult [email or web address] for contact info under        Signed, Sanda Klein, MD  10/01/2021, 9:35 AM

## 2021-10-01 NOTE — Plan of Care (Signed)

## 2021-10-01 NOTE — Progress Notes (Signed)
ANTICOAGULATION CONSULT NOTE  Pharmacy Consult for heparin Indication: chest pain/ACS  Allergies  Allergen Reactions   Flomax [Tamsulosin Hcl] Other (See Comments)    Dizzy   Lasix [Furosemide]     Dizziness   Silodosin Other (See Comments)    dizziness    Patient Measurements: Height: '5\' 11"'$  (180.3 cm) Weight: 70.2 kg (154 lb 12.2 oz) IBW/kg (Calculated) : 75.3 Heparin Dosing Weight: TBW  Vital Signs: Temp: 98 F (36.7 C) (08/17 0325) Temp Source: Oral (08/17 0325) BP: 126/66 (08/17 0325) Pulse Rate: 71 (08/17 0325)  Labs: Recent Labs    09/28/21 1627 09/28/21 1940 09/28/21 2121 09/29/21 0750 09/29/21 1800 09/30/21 0316 09/30/21 1515 10/01/21 0044 10/01/21 0617  HGB  --   --   --  13.4  --  14.1  --  12.2*  --   HCT  --   --   --  39.9  --  40.1  --  35.3*  --   PLT  --   --   --  242  --  243  --  212  --   APTT  --   --   --  51* 60* 82*  --   --   --   HEPARINUNFRC   < >  --   --  0.63  --  0.55 <0.10* 0.19* 0.32  CREATININE  --   --   --  2.10*  --  2.29*  --  2.45*  --   TROPONINIHS  --  2,318* 2,635*  --   --   --   --   --   --    < > = values in this interval not displayed.     Estimated Creatinine Clearance: 21.9 mL/min (A) (by C-G formula based on SCr of 2.45 mg/dL (H)).   Medical History: Past Medical History:  Diagnosis Date   Acid reflux    Aortic insufficiency    Cholecystitis    Combined systolic and diastolic heart failure ([place])    Echo 04/2018: inf-lat HK, EF 40-45, severe basal septal hypertrophy, mild conc LVH, Gr 1 DD, severe LAE, mild to mod TR, mod AI, asc Aorta 39 mm   Coronary artery disease 2002   CABG x5   Diabetes mellitus    DVT (deep venous thrombosis) (HCC)    previously on coumadin   Hypercholesteremia    Hyperlipidemia    Mitral valve regurgitation    Skin cancer    tumor removed off of his back    Assessment: [name] presenting with abdominal pain, elevated troponin, hx of afib on Eliquis with last dose 8/14  AM. Pt started on IV heparin for ACS r/o and need for cath.  Heparin level is therapeutic at 0.32, CBC stable this am. Possible staged PCI.  Goal of Therapy:  Heparin level 0.3-0.7 units/mL Monitor platelets by anticoagulation protocol: Yes   Plan:  Continue heparin 1100 units/h Daily heparin level and CBC  Nathan [name], PharmD, [place], [facility] Clinical Pharmacist [phone] Please check AMION for all [facility] Pharmacy numbers 10/01/2021

## 2021-10-02 ENCOUNTER — Inpatient Hospital Stay (HOSPITAL_COMMUNITY)
Admission: EM | Disposition: A | Discharge status: Home / Self Care | Payer: Self-pay | Source: Home / Self Care | Attending: Internal Medicine

## 2021-10-02 ENCOUNTER — Inpatient Hospital Stay (HOSPITAL_COMMUNITY): Payer: PPO

## 2021-10-02 ENCOUNTER — Encounter (HOSPITAL_COMMUNITY): Payer: Self-pay | Admitting: Internal Medicine

## 2021-10-02 DIAGNOSIS — I5020 Unspecified systolic (congestive) heart failure: Secondary | ICD-10-CM | POA: Diagnosis not present

## 2021-10-02 DIAGNOSIS — I25810 Atherosclerosis of coronary artery bypass graft(s) without angina pectoris: Secondary | ICD-10-CM | POA: Diagnosis not present

## 2021-10-02 HISTORY — PX: CORONARY STENT INTERVENTION: CATH118234

## 2021-10-02 LAB — POCT ACTIVATED CLOTTING TIME
Activated Clotting Time: 420 seconds
Activated Clotting Time: 197 seconds
Activated Clotting Time: 215 seconds

## 2021-10-02 LAB — BASIC METABOLIC PANEL
Anion gap: 7 (ref 5–15)
BUN: 37 mg/dL — ABNORMAL HIGH (ref 8–23)
CO2: 20 mmol/L — ABNORMAL LOW (ref 22–32)
Calcium: 8.5 mg/dL — ABNORMAL LOW (ref 8.9–10.3)
Chloride: 110 mmol/L (ref 98–111)
Creatinine, Ser: 2.38 mg/dL — ABNORMAL HIGH (ref 0.61–1.24)
GFR, Estimated: 26 mL/min — ABNORMAL LOW (ref >60)
Glucose, Bld: 119 mg/dL — ABNORMAL HIGH (ref 70–99)
Potassium: 4.3 mmol/L (ref 3.5–5.1)
Sodium: 137 mmol/L (ref 135–145)

## 2021-10-02 LAB — LIPOPROTEIN A (LPA): Lipoprotein (a): 10 nmol/L (ref <75.0)

## 2021-10-02 LAB — CBC
HCT: 37.6 % — ABNORMAL LOW (ref 39.0–52.0)
Hemoglobin: 12.6 g/dL — ABNORMAL LOW (ref 13.0–17.0)
MCH: 31 pg (ref 26.0–34.0)
MCHC: 33.5 g/dL (ref 30.0–36.0)
MCV: 92.6 fL (ref 80.0–100.0)
Platelets: 216 10*3/uL (ref 150–400)
RBC: 4.06 MIL/uL — ABNORMAL LOW (ref 4.22–5.81)
RDW: 13.7 % (ref 11.5–15.5)
WBC: 9 10*3/uL (ref 4.0–10.5)
nRBC: 0 % (ref 0.0–0.2)

## 2021-10-02 LAB — HEMOGLOBIN AND HEMATOCRIT, BLOOD
HCT: 38.7 % — ABNORMAL LOW (ref 39.0–52.0)
Hemoglobin: 13 g/dL (ref 13.0–17.0)

## 2021-10-02 LAB — GLUCOSE, CAPILLARY
Glucose-Capillary: 102 mg/dL — ABNORMAL HIGH (ref 70–99)
Glucose-Capillary: 74 mg/dL (ref 70–99)
Glucose-Capillary: 105 mg/dL — ABNORMAL HIGH (ref 70–99)

## 2021-10-02 LAB — HEPARIN LEVEL (UNFRACTIONATED): Heparin Unfractionated: 0.25 IU/mL — ABNORMAL LOW (ref 0.30–0.70)

## 2021-10-02 LAB — TYPE AND SCREEN
ABO/RH(D): O NEG
Antibody Screen: NEGATIVE

## 2021-10-02 LAB — ABO/RH: ABO/RH(D): O NEG

## 2021-10-02 SURGERY — CORONARY STENT INTERVENTION
Anesthesia: LOCAL

## 2021-10-02 MED ORDER — VERAPAMIL HCL 2.5 MG/ML IV SOLN
INTRAVENOUS | Status: AC
  Filled 2021-10-02: qty 2

## 2021-10-02 MED ORDER — HYDRALAZINE HCL 20 MG/ML IJ SOLN
10.0000 mg | INTRAMUSCULAR | Status: AC | PRN
  Administered 2021-10-02: 10 mg via INTRAVENOUS

## 2021-10-02 MED ORDER — SODIUM CHLORIDE 0.9 % IV SOLN: INTRAVENOUS | Status: DC

## 2021-10-02 MED ORDER — SODIUM CHLORIDE 0.9 % IV SOLN: 250.0000 mL | INTRAVENOUS | Status: DC | PRN

## 2021-10-02 MED ORDER — HEPARIN (PORCINE) IN NACL 1000-0.9 UT/500ML-% IV SOLN
INTRAVENOUS | Status: AC
  Filled 2021-10-02: qty 1000

## 2021-10-02 MED ORDER — FENTANYL CITRATE (PF) 100 MCG/2ML IJ SOLN
INTRAMUSCULAR | Status: AC
  Filled 2021-10-02: qty 2

## 2021-10-02 MED ORDER — VERAPAMIL HCL 2.5 MG/ML IV SOLN
INTRAVENOUS | Status: DC | PRN
  Administered 2021-10-02: 10 mL via INTRA_ARTERIAL

## 2021-10-02 MED ORDER — LIDOCAINE HCL (PF) 1 % IJ SOLN
INTRAMUSCULAR | Status: AC
  Filled 2021-10-02: qty 30

## 2021-10-02 MED ORDER — HEPARIN SODIUM (PORCINE) 1000 UNIT/ML IJ SOLN
INTRAMUSCULAR | Status: DC | PRN
  Administered 2021-10-02: 7000 [IU] via INTRAVENOUS

## 2021-10-02 MED ORDER — LIDOCAINE HCL (PF) 1 % IJ SOLN
INTRAMUSCULAR | Status: DC | PRN
  Administered 2021-10-02: 2 mL
  Administered 2021-10-02: 10 mL

## 2021-10-02 MED ORDER — HEPARIN SODIUM (PORCINE) 1000 UNIT/ML IJ SOLN
INTRAMUSCULAR | Status: AC
  Filled 2021-10-02: qty 10

## 2021-10-02 MED ORDER — MIDAZOLAM HCL 2 MG/2ML IJ SOLN
INTRAMUSCULAR | Status: AC
  Filled 2021-10-02: qty 2

## 2021-10-02 MED ORDER — HYDRALAZINE HCL 20 MG/ML IJ SOLN
INTRAMUSCULAR | Status: AC
  Filled 2021-10-02: qty 1

## 2021-10-02 MED ORDER — IOHEXOL 350 MG/ML SOLN
INTRAVENOUS | Status: DC | PRN
  Administered 2021-10-02: 45 mL

## 2021-10-02 MED ORDER — NITROGLYCERIN 1 MG/10 ML FOR IR/CATH LAB
INTRA_ARTERIAL | Status: AC
  Filled 2021-10-02: qty 10

## 2021-10-02 MED ORDER — FENTANYL CITRATE (PF) 100 MCG/2ML IJ SOLN
INTRAMUSCULAR | Status: DC | PRN
  Administered 2021-10-02: 25 ug via INTRAVENOUS

## 2021-10-02 MED ORDER — SODIUM CHLORIDE 0.9% FLUSH: 3.0000 mL | INTRAVENOUS | Status: DC | PRN

## 2021-10-02 MED ORDER — SODIUM CHLORIDE 0.9% FLUSH
3.0000 mL | Freq: Two times a day (BID) | INTRAVENOUS | Status: DC
  Administered 2021-10-03 – 2021-10-04 (×4): 3 mL via INTRAVENOUS

## 2021-10-02 MED ORDER — MIDAZOLAM HCL 2 MG/2ML IJ SOLN
INTRAMUSCULAR | Status: DC | PRN
  Administered 2021-10-02: 1 mg via INTRAVENOUS

## 2021-10-02 MED ORDER — HEPARIN (PORCINE) IN NACL 1000-0.9 UT/500ML-% IV SOLN
INTRAVENOUS | Status: DC | PRN
  Administered 2021-10-02 (×2): 500 mL

## 2021-10-02 MED ORDER — SODIUM CHLORIDE 0.9 % WEIGHT BASED INFUSION: 1.0000 mL/kg/h | INTRAVENOUS | Status: DC

## 2021-10-02 SURGICAL SUPPLY — 23 items
BALL SAPPHIRE NC24 4.0X10 (BALLOONS) ×1
BALLN SAPPHIRE 2.0X12 (BALLOONS) ×1
BALLOON SAPPHIRE 2.0X12 (BALLOONS) IMPLANT
BALLOON SAPPHIRE NC24 4.0X10 (BALLOONS) IMPLANT
CATH INFINITI JR4 5F (CATHETERS) ×1 IMPLANT
CATHETER LAUNCHER 6FR MP1 (CATHETERS) ×1 IMPLANT
DEVICE RAD COMP TR BAND LRG (VASCULAR PRODUCTS) ×1 IMPLANT
ELECT DEFIB PAD ADLT CADENCE (PAD) ×1 IMPLANT
GLIDESHEATH SLEND SS 6F .021 (SHEATH) ×1 IMPLANT
GUIDEWIRE INQWIRE 1.5J.035X260 (WIRE) IMPLANT
INQWIRE 1.5J .035X260CM (WIRE) ×1
KIT ENCORE 26 ADVANTAGE (KITS) ×1 IMPLANT
KIT HEART LEFT (KITS) ×2 IMPLANT
PACK CARDIAC CATHETERIZATION (CUSTOM PROCEDURE TRAY) ×2 IMPLANT
SHEATH PINNACLE 6F 10CM (SHEATH) ×1 IMPLANT
SHEATH PROBE COVER 6X72 (BAG) ×1 IMPLANT
STENT SYNERGY XD 3.50X16 (Permanent Stent) IMPLANT
SYNERGY XD 3.50X16 (Permanent Stent) ×1 IMPLANT
TRANSDUCER W/STOPCOCK (MISCELLANEOUS) ×2 IMPLANT
TUBING CIL FLEX 10 FLL-RA (TUBING) ×2 IMPLANT
WIRE ASAHI PROWATER 180CM (WIRE) ×1 IMPLANT
WIRE EMERALD 3MM-J .035X150CM (WIRE) ×1 IMPLANT
WIRE HI TORQ VERSACORE-J 145CM (WIRE) ×1 IMPLANT

## 2021-10-02 NOTE — Progress Notes (Signed)
[place] for heparin Indication: chest pain/ACS  Allergies  Allergen Reactions   Flomax [Tamsulosin Hcl] Other (See Comments)    Dizzy   Lasix [Furosemide]     Dizziness   Silodosin Other (See Comments)    dizziness    Patient Measurements: Height: '5\' 11"'$  (180.3 cm) Weight: 70.2 kg (154 lb 12.2 oz) IBW/kg (Calculated) : 75.3 Heparin Dosing Weight: TBW  Vital Signs: Temp: 97.4 F (36.3 C) (08/18 0025) Temp Source: Oral (08/18 0025) BP: 134/61 (08/18 0025) Pulse Rate: 69 (08/18 0025)  Labs: Recent Labs    09/29/21 0750 09/29/21 1800 09/30/21 0316 09/30/21 1515 10/01/21 0044 10/01/21 0617 10/02/21 0055  HGB 13.4  --  14.1  --  12.2*  --  12.6*  HCT 39.9  --  40.1  --  35.3*  --  37.6*  PLT 242  --  243  --  212  --  216  APTT 51* 60* 82*  --   --   --   --   HEPARINUNFRC 0.63  --  0.55   < > 0.19* 0.32 0.25*  CREATININE 2.10*  --  2.29*  --  2.45*  --   --    < > = values in this interval not displayed.     Estimated Creatinine Clearance: 21.9 mL/min (A) (by C-G formula based on SCr of 2.45 mg/dL (H)).   Assessment: 31 YOM presenting with abdominal pain, elevated troponin, hx of afib on Eliquis with last dose 8/14 AM. Pt started on IV heparin for ACS r/o and need for cath.  Heparin level down to subtherapeutic (0.25) on infusion at 1100 units/hr. No issues with line or bleeding reported per RN. Possible staged PCI 8/18.  Goal of Therapy:  Heparin level 0.3-0.7 units/mL Monitor platelets by anticoagulation protocol: Yes   Plan:  Increase heparin to 1250 units/hr F/u post-PCI  Sherlon Handing, PharmD, BCPS Please see amion for complete clinical pharmacist phone list 10/02/2021

## 2021-10-02 NOTE — Progress Notes (Signed)
Heart Failure Nurse Navigator Progress Note  PCP: Donnajean Lopes, MD PCP-Cardiologist: Nahser Admission Diagnosis: Generalized abdominal pain, NSTEMI, Acute on chronic congestive heart failure Admitted from: Home  Presentation:   Nathan [name] presented with -abdominal pain for 3 weeks, shortness of breath with walking. Has a chronic foley. BP 161/100, HR 75, BNP 1,791,Troponin 2,635,IV lasix and heparin given, CXr showed borderline cardiomegaly and pulmonary vascular congestion. Cath on 09/30/21 showed severe multivessel CAD with planned PCI on 10/02/21.   Patient and daughter were educated on the sign and symptoms of heart failure, daily weights, diet/ fluid restrictions, when to call his doctor or go to the ER. Importance of taking all medications as prescribed and attending all medical appointments. Both patient and daughter verbalized their understanding of the education. A HF TOC hospital follow up appointment was scheduled for 10/14/21 @ 11 am.   ECHO/ LVEF: 30-35%  Clinical Course:  Past Medical History:  Diagnosis Date   Acid reflux    Aortic insufficiency    Cholecystitis    Combined systolic and diastolic heart failure (HCC)    Echo 04/2018: inf-lat HK, EF 40-45, severe basal septal hypertrophy, mild conc LVH, Gr 1 DD, severe LAE, mild to mod TR, mod AI, asc Aorta 39 mm   Coronary artery disease 2002   CABG x5   Diabetes mellitus    DVT (deep venous thrombosis) (HCC)    previously on coumadin   Hypercholesteremia    Hyperlipidemia    Mitral valve regurgitation    Skin cancer    tumor removed off of his back     Social History   Socioeconomic History   Marital status: Divorced    Spouse name: Not on file   Number of children: 1   Years of education: Not on file   Highest education level: High school graduate  Occupational History   Occupation: Retired  Tobacco Use   Smoking status: Never   Smokeless tobacco: Never  Vaping Use   Vaping Use: Never used   Substance and Sexual Activity   Alcohol use: No   Drug use: No   Sexual activity: Not Currently  Other Topics Concern   Not on file  Social History Narrative   Not on file   Social Determinants of Health   Financial Resource Strain: Low Risk  (10/02/2021)   Overall Financial Resource Strain (CARDIA)    Difficulty of Paying Living Expenses: Not hard at all  Food Insecurity: No Food Insecurity (10/02/2021)   Hunger Vital Sign    Worried About Running Out of Food in the Last Year: Never true    Ran Out of Food in the Last Year: Never true  Transportation Needs: No Transportation Needs (10/02/2021)   PRAPARE - Hydrologist (Medical): No    Lack of Transportation (Non-Medical): No  Physical Activity: Not on file  Stress: Not on file  Social Connections: Not on file   Education Assessment and Provision:  Detailed education and instructions provided on heart failure disease management including the following:  Signs and symptoms of Heart Failure When to call the physician Importance of daily weights Low sodium diet Fluid restriction Medication management Anticipated future follow-up appointments  Patient education given on each of the above topics.  Patient acknowledges understanding via teach back method and acceptance of all instructions.  Education Materials:  "Living Better With Heart Failure" Booklet, HF zone tool, & Daily Weight Tracker Tool.  Patient has scale at home: yes  Patient has pill box at home: yes    High Risk Criteria for Readmission and/or Poor Patient Outcomes: Heart failure hospital admissions (last 6 months): 1  No Show rate: 1 % Difficult social situation: No Demonstrates medication adherence: Yes Primary Language: English Literacy level: Reading, writing, and comprehension  Barriers of Care:   Diet/ fluid restrictions (doesn't watch his salt) Daily weights  Considerations/Referrals:   Referral made to Heart Failure  Pharmacist Stewardship: yes Referral made to Heart Failure CSW/NCM TOC: No Referral made to Heart & Vascular TOC clinic: Yes, 10/14/21 @ 11 am  Items for Follow-up on DC/TOC: Diet/ fluid restrictions ( per daughter doesn't watch his salt) Daily weights   Earnestine Leys, BSN, RN Heart Failure Transport planner Only

## 2021-10-02 NOTE — H&P (View-Only) (Signed)
Progress Note  Patient Name: Nathan [name] Date of Encounter: 10/02/2021  [facility] Cardiologist: [name], MD   Subjective   No angina or dyspnea overnight. Fair urine output.  Creatinine marginally better than yesterday, but not far above his baseline of 2.0-2.3. Remains net negative roughly 5 L since admission.  Inpatient Medications    Scheduled Meds:  aspirin  81 mg Oral Daily   Chlorhexidine Gluconate Cloth  6 each Topical Daily   clopidogrel  75 mg Oral Daily   finasteride  5 mg Oral Daily   insulin aspart  0-6 Units Subcutaneous TID WC   insulin aspart protamine- aspart  10 Units Subcutaneous BID WC   magnesium oxide  400 mg Oral Daily   megestrol  20 mg Oral Daily   metoprolol tartrate  25 mg Oral BID   pantoprazole  40 mg Oral Daily   rosuvastatin  40 mg Oral Daily   sodium bicarbonate  650 mg Oral BID   sodium chloride flush  3 mL Intravenous Q12H   sodium chloride flush  3 mL Intravenous Q12H   sodium chloride flush  3 mL Intravenous Q12H   sodium chloride flush  3 mL Intravenous Q12H   Continuous Infusions:  sodium chloride     sodium chloride     sodium chloride 50 mL/hr at 10/01/21 2153   heparin 1,250 Units/hr (10/02/21 0211)   PRN Meds: sodium chloride, sodium chloride, acetaminophen, albuterol, ondansetron (ZOFRAN) IV, mouth rinse, sodium chloride flush, sodium chloride flush   Vital Signs    Vitals:   10/02/21 0025 10/02/21 0405 10/02/21 0521 10/02/21 0745  BP: 134/61 129/80  131/66  Pulse: 69 95  69  Resp: '17 20  17  '$ Temp: (!) 97.4 F (36.3 C) 97.7 F (36.5 C)  (!) 97.4 F (36.3 C)  TempSrc: Oral Oral  Oral  SpO2: 91% 94%  97%  Weight:   71.8 kg   Height:        Intake/Output Summary (Last 24 hours) at 10/02/2021 0838 Last data filed at 10/02/2021 0521 Gross per 24 hour  Intake 552.7 ml  Output 800 ml  Net -247.3 ml      10/02/2021    5:21 AM 10/01/2021    6:14 AM 09/30/2021    6:39 AM  Last 3 Weights  Weight (lbs) 158  lb 4.6 oz 154 lb 12.2 oz 152 lb 5.4 oz  Weight (kg) 71.8 kg 70.2 kg 69.1 kg      Telemetry    Sinus rhythm with occasional PVCs and occasional ventricular couplets, no episodes of NSVT- Personally Reviewed  ECG    No new tracing- Personally Reviewed  Physical Exam  Appears comfortable lying in bed at roughly 20 degree head of bed elevation GEN: No acute distress.   Neck: No JVD Cardiac: RRR with occasional ectopy, no murmurs, rubs, or gallops.  Respiratory: Clear to auscultation bilaterally. GI: Soft, nontender, non-distended  MS: No edema; No deformity. Neuro:  Nonfocal  Psych: Normal affect   Labs    High Sensitivity Troponin:   Recent Labs  Lab 09/28/21 1940 09/28/21 2121  TROPONINIHS 2,318* 2,635*     Chemistry Recent Labs  Lab 09/28/21 1627 09/29/21 0750 09/30/21 0316 10/01/21 0044 10/02/21 0055  NA 139 141 135 134* 137  K 3.8 3.9 3.6 4.2 4.3  CL 110 110 105 108 110  CO2 21* 23 23 20* 20*  GLUCOSE 281* 233* 177* 247* 119*  BUN 34* 29* 37* 37* 37*  CREATININE 2.18* 2.10* 2.29* 2.45* 2.38*  CALCIUM 9.3 9.6 9.3 8.3* 8.5*  MG  --  1.6*  --   --   --   PROT 6.8  --   --   --   --   ALBUMIN 3.2*  --   --   --   --   AST 51*  --   --   --   --   ALT 28  --   --   --   --   ALKPHOS 42  --   --   --   --   BILITOT 0.9  --   --   --   --   GFRNONAA 29* 30* 27* 25* 26*  ANIONGAP '8 8 7 6 7    '$ Lipids  Recent Labs  Lab 09/30/21 0316  CHOL 129  TRIG 106  HDL 40*  LDLCALC 68  CHOLHDL 3.2    Hematology Recent Labs  Lab 09/30/21 0316 10/01/21 0044 10/02/21 0055  WBC 10.7* 8.8 9.0  RBC 4.54 3.91* 4.06*  HGB 14.1 12.2* 12.6*  HCT 40.1 35.3* 37.6*  MCV 88.3 90.3 92.6  MCH 31.1 31.2 31.0  MCHC 35.2 34.6 33.5  RDW 13.2 13.8 13.7  PLT 243 212 216   Thyroid No results for input(s): "TSH", "FREET4" in the last 168 hours.  BNP Recent Labs  Lab 09/28/21 1627  BNP 1,791.6*    DDimer No results for input(s): "DDIMER" in the last 168 hours.    Radiology    CARDIAC CATHETERIZATION  Result Date: 09/30/2021   Mid LM to Dist LM lesion is 95% stenosed.   Prox LAD lesion is 95% stenosed.   Ost Cx to Prox Cx lesion is 95% stenosed.   Prox LAD to Mid LAD lesion is 99% stenosed.   1st Mrg lesion is 99% stenosed.   Prox RCA to Mid RCA lesion is 100% stenosed.   Origin to Prox Graft lesion is 100% stenosed.   Origin lesion is 100% stenosed.   Origin lesion before RPDA  is 95% stenosed.   Mid LAD lesion is 70% stenosed.   Mid LAD to Dist LAD lesion is 70% stenosed. Severe multivessel CAD with 95% distal left main stenosis, 95 to 99% proximal LAD stenosis; 95-99% proximal circumflex stenosis before a diminutive OM1 vessel with 99% stenosis; and total occlusion of the proximal RCA after a proximal branch. Patent LIMA to mid LAD. Old occlusion of the SVG which had supplied the diagonal vessel. Old occlusion of the SVG which had supplied the circumflex marginal vessel. New 95% near ostial/proximal SVG graft stenosis in the sequential graft supplying the PDA and PLA vessel. LV EDP 6 mmHg. RECOMMENDATION: Patient recently presented with increasing chest pain.  Troponins have increased to 2635 most likely due to the new subtotal near ostial/proximal graft stenosis in the sequential graft supplying the PDA and PLA vessels.  The patient will be hydrated post procedure with his stage IV CKD.  We will have colleagues review and most likely will require attempt at staged PCI to the SVG supplying the PDA and PLA system of the RCA.     Cardiac Studies   Cardiac catheterization 09/30/2021  Severe multivessel CAD with 95% distal left main stenosis, 95 to 99% proximal LAD stenosis; 95-99% proximal circumflex stenosis before a diminutive OM1 vessel with 99% stenosis; and total occlusion of the proximal RCA after a proximal branch.   Patent LIMA to mid LAD.   Old occlusion of the  SVG which had supplied the diagonal vessel.   Old occlusion of the SVG which had  supplied the circumflex marginal vessel.   New 95% near ostial/proximal SVG graft stenosis in the sequential graft supplying the PDA and PLA vessel.   LV EDP 6 mmHg.   RECOMMENDATION: Patient recently presented with increasing chest pain.  Troponins have increased to 2635 most likely due to the new subtotal near ostial/proximal graft stenosis in the sequential graft supplying the PDA and PLA vessels.  The patient will be hydrated post procedure with his stage IV CKD.  We will have colleagues review and most likely will require attempt at staged PCI to the SVG supplying the PDA and PLA system of the RCA.     Diagnostic Dominance: Right        Echocardiogram 09/29/2021    1. Left ventricular ejection fraction, by estimation, is 30 to 35%. The  left ventricle has moderately decreased function. The left ventricle  demonstrates regional wall motion abnormalities (see scoring  diagram/findings for description). The left  ventricular internal cavity size was mildly dilated. There is mild  concentric left ventricular hypertrophy. Indeterminate diastolic filling  due to E-A fusion.   2. Right ventricular systolic function is normal. The right ventricular  size is normal.   3. Left atrial size was severely dilated.   4. The mitral valve is abnormal. Moderate to severe mitral valve  regurgitation. No evidence of mitral stenosis.   5. The aortic valve is tricuspid. There is mild calcification of the  aortic valve. There is mild thickening of the aortic valve. Aortic valve  regurgitation is mild. Aortic valve sclerosis is present, with no evidence  of aortic valve stenosis.   Comparison(s): Changes from prior study are noted. The left ventricular  function is worsened  Patient Profile     88 y.o. male with a hx of CAD s/p CABG x 5 2002, aortic valve insufficiency and borderline aortic stenosis, paroxysmal atrial fibrillation/flutter on chronic anticoagulation, severe TR, chronic combined  CHF, chronic venous insufficiency, mitral valve regurgitation, CKD IV, DM, HLD and hypertension presented with 3 weeks history of intermittent lower abdomen and epigastric/lower sternal pain (squeezing pressure) consistent with unstable angina/non-STEMI (peak troponin 2635), cardiac catheterization shows known chronic occlusion of SVG-OM and SVG-diagonal, with new 95% stenosis of the SVG-PDA-PLV and patent LIMA to LAD, echo shows interval marked decrease in LVEF to 30-35% with new inferior lateral wall motion abnormalities  Assessment & Plan    CHF: No dyspnea at rest or with light activity.  Appears clinically euvolemic.  Diuretics on hold to allow cardiac catheterization/PCI in the setting of advanced CKD.  Not on RAAS inhibitors for the same reason.  Plan to change to metoprolol succinate at discharge. CAD/NSTEMI: Plan today for staged PCI to SVG-PDA-PLV for new high-grade proximal graft stenosis.  Elevation in troponin is relatively mild compared with the extent of myocardium in jeopardy, hopefully this means that there is substantial viable myocardium that can be salvaged.  Planned staged PCI to SVG tomorrow if renal function allows.  Have already loaded on clopidogrel yesterday (preferred over Brilinta due to lower bleeding risk; plan for him to go back on direct oral anticoagulant once catheter based procedures are complete). CKD 4: Chronic moderate to severe renal dysfunction with creatinine baseline around 2.0-2.3, increased to 2.45 after diagnostic catheterization, slightly better 2.38 today.  Has been receiving IV fluids for last 24 hours.  Diuretics on hold avoid all other potential nephrotoxic agents. HLP: Maximum  dose rosuvastatin.  LDL less than 70.  Fenofibrate discontinued due to increased risk of toxicity with renal insufficiency. DM: Fair glycemic control with hemoglobin A1c 7.3% Parox Afib: Maintaining sinus rhythm at this time.  On chronic Eliquis 2.5 mg twice daily.   Percutaneous  revascularization/stent procedure has been fully reviewed with the patient and written informed consent has been obtained.     For questions or updates, please contact [place] Please consult [email or web address] for contact info under        Signed, Sanda Klein, MD  10/02/2021, 8:38 AM

## 2021-10-02 NOTE — Progress Notes (Addendum)
Called to see patient in holding area post stent by Dr Martinique Patient stable without chest pain or significant abdominal pain RFA palpable with small hematoma medially toward scrotum Soft no active bleeding from puncture site  BP 130/70 pulse 92  Held area for 5 minutes manually soft no change in size  Check stat Hct/Hb NS 100 cc/hour Fem stop for 1 hour proximal to puncture site Showed tech where to place Then check non contrast CT abdomen / pelvis Can do sooner if large Hct drop Transfer to [place] unit rather than 2C for closer monitoring and 1/1 nursing care  Baseline Hct 37 will have to be careful with NS as Cr 2.3 and EF 30-35% with severe MR If stat Hct has not dropped much and hemodynamics remain stable can KVO fluids after 250 cc   Critical care time 30 minutes   Jenkins Rouge MD [facility]

## 2021-10-02 NOTE — Progress Notes (Addendum)
Femstop placed to right groin at approximately 1855, femstop pressure at 93, MAP at 88, placed by [facility], Supervisor, Right pedal pulse palpable at +2, right groin marked, safety maintained  Dr Johnsie Cancel remains at bedside, verbal orders obtained to place and leave femstop in place x 1hour and then to CT for non-contrast CT after 1 hour, blood work ordered, phlebotomy notified

## 2021-10-02 NOTE — Progress Notes (Signed)
Mobility Specialist Progress Note    10/02/21 1425  Mobility  Activity Off unit   Pt going for procedure. Will f/u as schedule permits.   Hildred Alamin Mobility Specialist

## 2021-10-02 NOTE — Progress Notes (Signed)
Physical Therapy Treatment Patient Details Name: Nathan [name] MRN: [id number] DOB: 1933/12/30 Today's Date: 10/02/2021   History of Present Illness Pt is an 88 y.o. male admitted 09/28/21 with abdominal pain. CXR with cardiomegaly with pulmonary vascular congestion. Workup for acute on chronic HF, hiatal hernia. Abnormal echo 8/15, plan for coronary angiography 8/16. PMH includes CHF, CAD, DM, DVT, HLD, mitral valve regurgitation, orthostatic hypotension.   PT Comments    Pt progressing with mobility. Pt demonstrates improving activity tolerance and stability with ambulation; pt declines gait training with DME, including rollator. Noted plan for PCI today. Will continue to follow acutely to address established goals.    Recommendations for follow up therapy are one component of a multi-disciplinary discharge planning process, led by the attending physician.  Recommendations may be updated based on patient status, additional functional criteria and insurance authorization.  Follow Up Recommendations  No PT follow up     Assistance Recommended at Discharge Intermittent Supervision/Assistance  Patient can return home with the following Assistance with cooking/housework;Assist for transportation;Help with stairs or ramp for entrance   Equipment Recommendations  None recommended by PT    Recommendations for Other Services       Precautions / Restrictions Precautions Precautions: Fall;Other (comment) Precaution Comments: chronic indwelling foley cath Restrictions Weight Bearing Restrictions: No     Mobility  Bed Mobility Overal bed mobility: Modified Independent                  Transfers Overall transfer level: Needs assistance Equipment used: None Transfers: Sit to/from Stand Sit to Stand: Supervision                Ambulation/Gait Ambulation/Gait assistance: Min guard, Supervision Gait Distance (Feet): 500 Feet Assistive device: None Gait Pattern/deviations:  Step-through pattern, Decreased stride length, Trunk flexed Gait velocity: Decreased     General Gait Details: slow, mostly steady gait without DME, pt holding his foley cath; intermittent min guard for balance, progressing to supervision; pt reports, "I broke a sweat on that one"   Stairs             Wheelchair Mobility    Modified Rankin (Stroke Patients Only)       Balance Overall balance assessment: Needs assistance   Sitting balance-[name]: Good     Standing balance support: No upper extremity supported, During functional activity Standing balance-[name]: Fair               High level balance activites: Turns, Sudden stops, Head turns High Level Balance Comments: self-corrected instability with turning            Cognition Arousal/Alertness: Awake/alert Behavior During Therapy: WFL for tasks assessed/performed Overall Cognitive Status: Within Functional Limits for tasks assessed                                          Exercises      General Comments        Pertinent Vitals/Pain Pain Assessment Pain Assessment: No/denies pain    Home Living                          Prior Function            PT Goals (current goals can now be found in the care plan section) Progress towards PT goals: Progressing toward goals  Frequency    Min 3X/week      PT Plan Current plan remains appropriate    Co-evaluation              AM-PAC PT "6 Clicks" Mobility   Outcome Measure  Help needed turning from your back to your side while in a flat bed without using bedrails?: None Help needed moving from lying on your back to sitting on the side of a flat bed without using bedrails?: None Help needed moving to and from a bed to a chair (including a wheelchair)?: A Little Help needed standing up from a chair using your arms (e.g., wheelchair or bedside chair)?: A Little Help needed to walk in hospital room?:  A Little Help needed climbing 3-5 steps with a railing? : A Little 6 Click Score: 20    End of Session Equipment Utilized During Treatment: Gait belt Activity Tolerance: Patient tolerated treatment well Patient left: in bed;with call bell/phone within reach Nurse Communication: Mobility status PT Visit Diagnosis: Other abnormalities of gait and mobility (R26.89);Muscle weakness (generalized) (M62.81)     Time: 3817-7116 PT Time Calculation (min) (ACUTE ONLY): 17 min  Charges:  $Gait Training: 8-22 mins                     Mabeline Caras, PT, DPT Acute Rehabilitation Services  Personal: [place] [facility] Office: [place] 10/02/2021, 1:06 PM

## 2021-10-02 NOTE — Progress Notes (Signed)
Heart Failure Stewardship Pharmacist Progress Note   PCP: Donnajean Lopes, MD PCP-Cardiologist: [name], MD    HPI:  88 yo M with PMH of CHF, MI, HTN, CKD IV, T2DM, PAF, mitral regurgitation. He presented to [facility] ED on 8/14 with intermittent abdominal pain for the past 3 weeks, described as indigestion symptoms. No complaints of chest discomfort. He has noticed increased SOB with exertion and bending over. Pt has a chronic indwelling foley catheter that was removed. Urine cx sent for evaluation, now growing >100k ecoli.   CXR showed borderline cardiomegaly and pulmonary vascular congestion. ECHO on 8/15 showed LVEF 30-35% (40-45% on 04/2020) with regional wall motion abnormalities, and mild LVH. Taken to cath on 8/16 and found to have severe multivessel CAD with planned PCI on 8/18.  Current HF Medications: Beta Blocker: metoprolol tartrate 25 mg BID  Prior to admission HF Medications: Beta blocker: Metoprolol tartrate '25mg'$  BID  Pertinent Lab Values: Serum creatinine 2.38, BUN 37, Potassium 4.3, Sodium 137, BNP 1791, Magnesium 1.6 (8/15), A1c 7.3  Vital Signs: Weight: 158 lbs (admission weight: 165 lbs) Blood pressure: 130/60s Heart rate: 60-80s I/O: -462m yesterday; net -5L  Medication Assistance / Insurance Benefits Check: Does the patient have prescription insurance?  Yes Type of insurance plan: [employer]  Does the patient qualify for medication assistance through manufacturers or grants?   Pending Eligible grants and/or patient assistance programs: pending Medication assistance applications in progress: none  Medication assistance applications approved: none Approved medication assistance renewals will be completed by: pending  Outpatient Pharmacy:  [email or web address] the [facility] willing to [facility] at [facility]? Pending Is the patient willing to [facility] their [facility] pharmacy [facility]?   Pending    Assessment: 1. Acute on chronic systolic CHF (LVEF 398-11%, due to ischemic causes from prior NSTEMI. NYHA class III symptoms. - Not volume overloaded on exam. Weight up after receiving post-cath fluids. Monitor weights closely and may need to resume diuretics post PCI today.  - On metoprolol tartrate 25 mg BID, with new HFrEF, will need to consolidate to metoprolol XL prior to discharge - No ACE/ARB/ARNI or MRA with renal dysfunction. Creatinine stable today. Repeat BMET in AM after contrast with cath.  - Catheter exchange showed >100k ecoli. Not treating per TRH. Consiser adding SGLT2i prior to discharge pending renal improvement.     Plan: 1) Medication changes recommended at this time: - Continue current regimen pending renal impact from cath today  2) Patient assistance: - Entresto copay $40 - Jardiance copay $40  3)  Education  - Patient has been educated on current HF medications and potential additions to HF medication regimen - Patient verbalizes understanding that over the next few months, these medication doses may change and more medications may be added to optimize HF regimen - Patient has been educated on basic disease state pathophysiology and goals of therapy   MKerby Nora PharmD, BCPS Heart Failure Stewardship Pharmacist Phone [phone] Please check [email or web address] for unit-specific pharmacist phone numbers

## 2021-10-02 NOTE — Interval H&P Note (Signed)
History and Physical Interval Note:  10/02/2021 2:30 PM  Nathan [name]  has presented today for surgery, with the diagnosis of CAD , NSTEMI.  The various methods of treatment have been discussed with the patient and family. After consideration of risks, benefits and other options for treatment, the patient has consented to  Procedure(s): CORONARY STENT INTERVENTION (N/A) as a surgical intervention.  The patient's history has been reviewed, patient examined, no change in status, stable for surgery.  I have reviewed the patient's chart and labs.  Questions were answered to the patient's satisfaction.   Cath Lab Visit (complete for each Cath Lab visit)  Clinical Evaluation Leading to the Procedure:   ACS: Yes.    Non-ACS:    Anginal Classification: CCS IV  Anti-ischemic medical therapy: Maximal Therapy (2 or more classes of medications)  Non-Invasive Test Results: No non-invasive testing performed  Prior CABG: Previous CABG        Nathan [name] [name] 10/02/2021. 2:30 PM

## 2021-10-02 NOTE — Progress Notes (Signed)
PROGRESS NOTE    Nathan [name]  ZYS:[id number] DOB: 09/24/1933 DOA: 09/28/2021 PCP: Nathan Lopes, MD    Brief Narrative:  87-year gentleman with history of chronic congestive heart failure, paroxysmal A-fib on Eliquis, aortic insufficiency, coronary artery disease status post CABG, hypertension hyperlipidemia type 2 diabetes and CKD stage IV presented to the emergency room with intermittent abdominal pain for about 3 weeks.  Patient has chronic indwelling Foley catheter that was changed 5 days ago.  In the emergency room hemodynamically stable.  BNP 1791.  High-sensitivity troponins 2318-2635.  Patient admitted with NSTEMI and cardiology consultation. 8/16, underwent cardiac catheterization and found to have extensive coronary artery disease.  For a staged PCI.  Repeat PCI planned today.     Assessment & Plan:   Non-ST elevation MI in a patient with known coronary artery disease: Currently chest pain-free.  Remains on heparin infusion.  On metoprolol 25 mg twice daily.  Crestor 40 mg daily.   Cardiac cath with high-grade stenosis in the functional SVG to PDA and PLB, decreased left ventricular ejection fraction. Started on Plavix, staged PCI to SVG plan for today.  Cardiology following.  Currently chest pain-free.  Acute on chronic systolic congestive heart failure: Symptomatic on arrival.  Elevated BNP.  Echocardiogram with known ejection fraction 30 to 35%.  Moderate to severe mitral regurgitation. Treated with IV diuresis, euvolemic.  Unable to tolerate GDMT.  Diuretics on hold to make room for contrasted studies.    CKD stage IV: Creatinine at about baseline. 2.3-2.7 in his outpatient reports.  Close follow-up on treatment, diuretics and cardiac cath.  Hyperlipidemia: On Crestor.  Type 2 diabetes: A1c 7.3.  Fairly controlled.  Resumed home dose of insulin.  Electrolytes: Magnesium and potassium replaced.  Adequate.  BPH with urinary retention/chronic indwelling Foley  catheter: Stable.  Continue Proscar.  Paroxysmal A-fib: Currently sinus rhythm.  On Eliquis.  Rate controlled.  Eliquis replaced with heparin perioperative.  E. coli bacteriuria: Recent catheter exchange.  No evidence of systemic infection.  E. coli [name] than 100,000 colonies.  Will not treat with antibiotics.   DVT prophylaxis: SCD's Start: 09/30/21 1422  Heparin infusion    Code Status: full Code Family Communication: Daughter Nathan [name] on the phone. Disposition Plan: Status is: Inpatient Remains inpatient appropriate because: Inpatient cardiac procedures planned     Consultants:  Cardiology  Procedures:  Cath, plan  Antimicrobials:  None   Subjective: Seen and examined.  Denies any chest pain or shortness of breath.  Anxious.  Daughter on the speaker phone.  Aware about the procedure today.  Objective: Vitals:   10/02/21 0025 10/02/21 0405 10/02/21 0521 10/02/21 0745  BP: 134/61 129/80  131/66  Pulse: 69 95  69  Resp: '17 20  17  '$ Temp: (!) 97.4 F (36.3 C) 97.7 F (36.5 C)  (!) 97.4 F (36.3 C)  TempSrc: Oral Oral  Oral  SpO2: 91% 94%  97%  Weight:   71.8 kg   Height:        Intake/Output Summary (Last 24 hours) at 10/02/2021 0927 Last data filed at 10/02/2021 0521 Gross per 24 hour  Intake 552.7 ml  Output 800 ml  Net -247.3 ml    Filed Weights   09/30/21 0639 10/01/21 0614 10/02/21 0521  Weight: 69.1 kg 70.2 kg 71.8 kg    Examination:  General exam: Calm and comfortable but anxious. Respiratory system: No added sounds. Cardiovascular system: S1 & S2 heard, RRR. No pedal edema. Gastrointestinal system: Soft.  Nontender.  Bowel sound present.   Foley catheter with clear urine.   Central nervous system: Alert and oriented. No focal neurological deficits. Extremities: Symmetric 5 x 5 power.     Data Reviewed: I have personally reviewed following labs and imaging studies  CBC: Recent Labs  Lab 09/28/21 1627 09/29/21 0750 09/30/21 0316  10/01/21 0044 10/02/21 0055  WBC 8.2 10.7* 10.7* 8.8 9.0  HGB 12.2* 13.4 14.1 12.2* 12.6*  HCT 37.1* 39.9 40.1 35.3* 37.6*  MCV 91.8 91.7 88.3 90.3 92.6  PLT 236 242 243 212 161    Basic Metabolic Panel: Recent Labs  Lab 09/28/21 1627 09/29/21 0750 09/30/21 0316 10/01/21 0044 10/02/21 0055  NA 139 141 135 134* 137  K 3.8 3.9 3.6 4.2 4.3  CL 110 110 105 108 110  CO2 21* 23 23 20* 20*  GLUCOSE 281* 233* 177* 247* 119*  BUN 34* 29* 37* 37* 37*  CREATININE 2.18* 2.10* 2.29* 2.45* 2.38*  CALCIUM 9.3 9.6 9.3 8.3* 8.5*  MG  --  1.6*  --   --   --     GFR: Estimated Creatinine Clearance: 23 mL/min (A) (by C-G formula based on SCr of 2.38 mg/dL (H)). Liver Function Tests: Recent Labs  Lab 09/28/21 1627  AST 51*  ALT 28  ALKPHOS 42  BILITOT 0.9  PROT 6.8  ALBUMIN 3.2*    Recent Labs  Lab 09/28/21 1627  LIPASE 40    No results for input(s): "AMMONIA" in the last 168 hours. Coagulation Profile: No results for input(s): "INR", "PROTIME" in the last 168 hours. Cardiac Enzymes: No results for input(s): "CKTOTAL", "CKMB", "CKMBINDEX", "TROPONINI" in the last 168 hours. BNP (last 3 results) No results for input(s): "PROBNP" in the last 8760 hours. HbA1C: No results for input(s): "HGBA1C" in the last 72 hours.  CBG: Recent Labs  Lab 10/01/21 0607 10/01/21 1120 10/01/21 1623 10/01/21 2158 10/02/21 0609  GLUCAP 154* 109* 160* 132* 102*    Lipid Profile: Recent Labs    09/30/21 0316  CHOL 129  HDL 40*  LDLCALC 68  TRIG 106  CHOLHDL 3.2    Thyroid Function Tests: No results for input(s): "TSH", "T4TOTAL", "FREET4", "T3FREE", "THYROIDAB" in the last 72 hours. Anemia Panel: No results for input(s): "VITAMINB12", "FOLATE", "FERRITIN", "TIBC", "IRON", "RETICCTPCT" in the last 72 hours. Sepsis Labs: No results for input(s): "PROCALCITON", "LATICACIDVEN" in the last 168 hours.  Recent Results (from the past 240 hour(s))  Urine Culture     Status: Abnormal    Collection Time: 09/28/21 10:48 PM   Specimen: Urine, Clean Catch  Result Value Ref Range Status   Specimen Description   Final    URINE, CLEAN CATCH Performed at [facility], [address] [place], [place] [address]    Special Requests   Final    NONE Performed at [facility], [address] [place], [place] [address]    Culture >=100,000 COLONIES/mL ESCHERICHIA COLI (A)  Final   Report Status 10/01/2021 FINAL  Final   Organism ID, Bacteria ESCHERICHIA COLI (A)  Final      Susceptibility   Escherichia coli - MIC*    AMPICILLIN >=32 RESISTANT Resistant     CEFAZOLIN <=4 SENSITIVE Sensitive     CEFEPIME <=0.12 SENSITIVE Sensitive     CEFTRIAXONE <=0.25 SENSITIVE Sensitive     CIPROFLOXACIN <=0.25 SENSITIVE Sensitive     GENTAMICIN <=1 SENSITIVE Sensitive     IMIPENEM <=0.25 SENSITIVE Sensitive  NITROFURANTOIN <=16 SENSITIVE Sensitive     TRIMETH/SULFA <=20 SENSITIVE Sensitive     AMPICILLIN/SULBACTAM 16 INTERMEDIATE Intermediate     PIP/TAZO <=4 SENSITIVE Sensitive     * >=100,000 COLONIES/mL ESCHERICHIA COLI  MRSA Next Gen by PCR, Nasal     Status: Abnormal   Collection Time: 09/29/21  5:05 AM   Specimen: Nasal Mucosa; Nasal Swab  Result Value Ref Range Status   MRSA by PCR Next Gen DETECTED (A) NOT DETECTED Final    Comment: CRITICAL RESULT CALLED TO, READ BACK BY AND VERIFIED WITH: C/ ATommie Raymond, RN 09/29/21 0625 A. LAFRANCE (NOTE) The GeneXpert MRSA Assay (FDA approved for NASAL specimens only), is one component of a comprehensive MRSA colonization surveillance program. It is not intended to diagnose MRSA infection nor to guide or monitor treatment for MRSA infections. Test performance is not FDA approved in patients less than 47 years old. Performed at [facility], [place] [address], [place], [place] [address]          Radiology Studies: CARDIAC CATHETERIZATION  Result Date: 09/30/2021   Mid LM to Dist LM lesion is 95%  stenosed.   Prox LAD lesion is 95% stenosed.   Ost Cx to Prox Cx lesion is 95% stenosed.   Prox LAD to Mid LAD lesion is 99% stenosed.   1st Mrg lesion is 99% stenosed.   Prox RCA to Mid RCA lesion is 100% stenosed.   Origin to Prox Graft lesion is 100% stenosed.   Origin lesion is 100% stenosed.   Origin lesion before RPDA  is 95% stenosed.   Mid LAD lesion is 70% stenosed.   Mid LAD to Dist LAD lesion is 70% stenosed. Severe multivessel CAD with 95% distal left main stenosis, 95 to 99% proximal LAD stenosis; 95-99% proximal circumflex stenosis before a diminutive OM1 vessel with 99% stenosis; and total occlusion of the proximal RCA after a proximal branch. Patent LIMA to mid LAD. Old occlusion of the SVG which had supplied the diagonal vessel. Old occlusion of the SVG which had supplied the circumflex marginal vessel. New 95% near ostial/proximal SVG graft stenosis in the sequential graft supplying the PDA and PLA vessel. LV EDP 6 mmHg. RECOMMENDATION: Patient recently presented with increasing chest pain.  Troponins have increased to 2635 most likely due to the new subtotal near ostial/proximal graft stenosis in the sequential graft supplying the PDA and PLA vessels.  The patient will be hydrated post procedure with his stage IV CKD.  We will have colleagues review and most likely will require attempt at staged PCI to the SVG supplying the PDA and PLA system of the RCA.         Scheduled Meds:  aspirin  81 mg Oral Daily   Chlorhexidine Gluconate Cloth  6 each Topical Daily   clopidogrel  75 mg Oral Daily   finasteride  5 mg Oral Daily   insulin aspart  0-6 Units Subcutaneous TID WC   insulin aspart protamine- aspart  10 Units Subcutaneous BID WC   magnesium oxide  400 mg Oral Daily   megestrol  20 mg Oral Daily   metoprolol tartrate  25 mg Oral BID   pantoprazole  40 mg Oral Daily   rosuvastatin  40 mg Oral Daily   sodium bicarbonate  650 mg Oral BID   sodium chloride flush  3 mL Intravenous  Q12H   sodium chloride flush  3 mL Intravenous Q12H   sodium chloride flush  3 mL  Intravenous Q12H   sodium chloride flush  3 mL Intravenous Q12H   Continuous Infusions:  sodium chloride     sodium chloride     sodium chloride 50 mL/hr at 10/01/21 2153   heparin 1,250 Units/hr (10/02/21 0211)     LOS: 3 days    Time spent: 35 minutes    Barb Merino, MD [facility] Pager [phone]

## 2021-10-02 NOTE — Progress Notes (Signed)
16:35 ACT drawn and resulted at 215.  16:45 patient developed oozing around sheath. Manual pressure placed over insertion site.  17:10 Repeat ACT drawn and resulted at 197.  17:15. DR Martinique paged, updated on ACT and oozing insertion site. DR Martinique verbally ordered the sheath to be removed. Sheath pulled and manual pressure held for approximately 20 minutes (17:40). After manual pressure released a hematoma was noted, size approximate 4.5 inches, around the insertion site. Manual pressure was resumed and continued for 20 more minutes (18:00), Hematoma reduced in size, however hematoma returned after 2 minutes of no pressure. Manual pressure reapplied for approximately 20 minutes (18:20). Hematoma continued to be noted. Direct pressure reduced the size, but returned after manual pressure was released. Manual pressure resumed.  18:45 On call fellow paged Jenkins Rouge. Fellow arrived in the Cath Lab holding area. Orders received: Femostop; CT of abdomen and pelvis after Femostop being in place for 60 minutes; blood draw for type and crossmatch; blood draw for H&H; and NaCl infusion rate of 100 mls/ hour.  18:50 Femostop placed, then repositioned at 19:00 with good results and positive distal pedal pulse.  19:15 Report given to Phillip Heal, RN, 2H. Family member [name] was called and message left to call back for an update on the patient.

## 2021-10-02 NOTE — Progress Notes (Signed)
Progress Note  Patient Name: Nathan [name] Date of Encounter: 10/02/2021  [facility] Cardiologist: [name], MD   Subjective   No angina or dyspnea overnight. Fair urine output.  Creatinine marginally better than yesterday, but not far above his baseline of 2.0-2.3. Remains net negative roughly 5 L since admission.  Inpatient Medications    Scheduled Meds:  aspirin  81 mg Oral Daily   Chlorhexidine Gluconate Cloth  6 each Topical Daily   clopidogrel  75 mg Oral Daily   finasteride  5 mg Oral Daily   insulin aspart  0-6 Units Subcutaneous TID WC   insulin aspart protamine- aspart  10 Units Subcutaneous BID WC   magnesium oxide  400 mg Oral Daily   megestrol  20 mg Oral Daily   metoprolol tartrate  25 mg Oral BID   pantoprazole  40 mg Oral Daily   rosuvastatin  40 mg Oral Daily   sodium bicarbonate  650 mg Oral BID   sodium chloride flush  3 mL Intravenous Q12H   sodium chloride flush  3 mL Intravenous Q12H   sodium chloride flush  3 mL Intravenous Q12H   sodium chloride flush  3 mL Intravenous Q12H   Continuous Infusions:  sodium chloride     sodium chloride     sodium chloride 50 mL/hr at 10/01/21 2153   heparin 1,250 Units/hr (10/02/21 0211)   PRN Meds: sodium chloride, sodium chloride, acetaminophen, albuterol, ondansetron (ZOFRAN) IV, mouth rinse, sodium chloride flush, sodium chloride flush   Vital Signs    Vitals:   10/02/21 0025 10/02/21 0405 10/02/21 0521 10/02/21 0745  BP: 134/61 129/80  131/66  Pulse: 69 95  69  Resp: '17 20  17  '$ Temp: (!) 97.4 F (36.3 C) 97.7 F (36.5 C)  (!) 97.4 F (36.3 C)  TempSrc: Oral Oral  Oral  SpO2: 91% 94%  97%  Weight:   71.8 kg   Height:        Intake/Output Summary (Last 24 hours) at 10/02/2021 0838 Last data filed at 10/02/2021 0521 Gross per 24 hour  Intake 552.7 ml  Output 800 ml  Net -247.3 ml      10/02/2021    5:21 AM 10/01/2021    6:14 AM 09/30/2021    6:39 AM  Last 3 Weights  Weight (lbs) 158  lb 4.6 oz 154 lb 12.2 oz 152 lb 5.4 oz  Weight (kg) 71.8 kg 70.2 kg 69.1 kg      Telemetry    Sinus rhythm with occasional PVCs and occasional ventricular couplets, no episodes of NSVT- Personally Reviewed  ECG    No new tracing- Personally Reviewed  Physical Exam  Appears comfortable lying in bed at roughly 20 degree head of bed elevation GEN: No acute distress.   Neck: No JVD Cardiac: RRR with occasional ectopy, no murmurs, rubs, or gallops.  Respiratory: Clear to auscultation bilaterally. GI: Soft, nontender, non-distended  MS: No edema; No deformity. Neuro:  Nonfocal  Psych: Normal affect   Labs    High Sensitivity Troponin:   Recent Labs  Lab 09/28/21 1940 09/28/21 2121  TROPONINIHS 2,318* 2,635*     Chemistry Recent Labs  Lab 09/28/21 1627 09/29/21 0750 09/30/21 0316 10/01/21 0044 10/02/21 0055  NA 139 141 135 134* 137  K 3.8 3.9 3.6 4.2 4.3  CL 110 110 105 108 110  CO2 21* 23 23 20* 20*  GLUCOSE 281* 233* 177* 247* 119*  BUN 34* 29* 37* 37* 37*  CREATININE 2.18* 2.10* 2.29* 2.45* 2.38*  CALCIUM 9.3 9.6 9.3 8.3* 8.5*  MG  --  1.6*  --   --   --   PROT 6.8  --   --   --   --   ALBUMIN 3.2*  --   --   --   --   AST 51*  --   --   --   --   ALT 28  --   --   --   --   ALKPHOS 42  --   --   --   --   BILITOT 0.9  --   --   --   --   GFRNONAA 29* 30* 27* 25* 26*  ANIONGAP '8 8 7 6 7    '$ Lipids  Recent Labs  Lab 09/30/21 0316  CHOL 129  TRIG 106  HDL 40*  LDLCALC 68  CHOLHDL 3.2    Hematology Recent Labs  Lab 09/30/21 0316 10/01/21 0044 10/02/21 0055  WBC 10.7* 8.8 9.0  RBC 4.54 3.91* 4.06*  HGB 14.1 12.2* 12.6*  HCT 40.1 35.3* 37.6*  MCV 88.3 90.3 92.6  MCH 31.1 31.2 31.0  MCHC 35.2 34.6 33.5  RDW 13.2 13.8 13.7  PLT 243 212 216   Thyroid No results for input(s): "TSH", "FREET4" in the last 168 hours.  BNP Recent Labs  Lab 09/28/21 1627  BNP 1,791.6*    DDimer No results for input(s): "DDIMER" in the last 168 hours.    Radiology    CARDIAC CATHETERIZATION  Result Date: 09/30/2021   Mid LM to Dist LM lesion is 95% stenosed.   Prox LAD lesion is 95% stenosed.   Ost Cx to Prox Cx lesion is 95% stenosed.   Prox LAD to Mid LAD lesion is 99% stenosed.   1st Mrg lesion is 99% stenosed.   Prox RCA to Mid RCA lesion is 100% stenosed.   Origin to Prox Graft lesion is 100% stenosed.   Origin lesion is 100% stenosed.   Origin lesion before RPDA  is 95% stenosed.   Mid LAD lesion is 70% stenosed.   Mid LAD to Dist LAD lesion is 70% stenosed. Severe multivessel CAD with 95% distal left main stenosis, 95 to 99% proximal LAD stenosis; 95-99% proximal circumflex stenosis before a diminutive OM1 vessel with 99% stenosis; and total occlusion of the proximal RCA after a proximal branch. Patent LIMA to mid LAD. Old occlusion of the SVG which had supplied the diagonal vessel. Old occlusion of the SVG which had supplied the circumflex marginal vessel. New 95% near ostial/proximal SVG graft stenosis in the sequential graft supplying the PDA and PLA vessel. LV EDP 6 mmHg. RECOMMENDATION: Patient recently presented with increasing chest pain.  Troponins have increased to 2635 most likely due to the new subtotal near ostial/proximal graft stenosis in the sequential graft supplying the PDA and PLA vessels.  The patient will be hydrated post procedure with his stage IV CKD.  We will have colleagues review and most likely will require attempt at staged PCI to the SVG supplying the PDA and PLA system of the RCA.     Cardiac Studies   Cardiac catheterization 09/30/2021  Severe multivessel CAD with 95% distal left main stenosis, 95 to 99% proximal LAD stenosis; 95-99% proximal circumflex stenosis before a diminutive OM1 vessel with 99% stenosis; and total occlusion of the proximal RCA after a proximal branch.   Patent LIMA to mid LAD.   Old occlusion of the  SVG which had supplied the diagonal vessel.   Old occlusion of the SVG which had  supplied the circumflex marginal vessel.   New 95% near ostial/proximal SVG graft stenosis in the sequential graft supplying the PDA and PLA vessel.   LV EDP 6 mmHg.   RECOMMENDATION: Patient recently presented with increasing chest pain.  Troponins have increased to 2635 most likely due to the new subtotal near ostial/proximal graft stenosis in the sequential graft supplying the PDA and PLA vessels.  The patient will be hydrated post procedure with his stage IV CKD.  We will have colleagues review and most likely will require attempt at staged PCI to the SVG supplying the PDA and PLA system of the RCA.     Diagnostic Dominance: Right        Echocardiogram 09/29/2021    1. Left ventricular ejection fraction, by estimation, is 30 to 35%. The  left ventricle has moderately decreased function. The left ventricle  demonstrates regional wall motion abnormalities (see scoring  diagram/findings for description). The left  ventricular internal cavity size was mildly dilated. There is mild  concentric left ventricular hypertrophy. Indeterminate diastolic filling  due to E-A fusion.   2. Right ventricular systolic function is normal. The right ventricular  size is normal.   3. Left atrial size was severely dilated.   4. The mitral valve is abnormal. Moderate to severe mitral valve  regurgitation. No evidence of mitral stenosis.   5. The aortic valve is tricuspid. There is mild calcification of the  aortic valve. There is mild thickening of the aortic valve. Aortic valve  regurgitation is mild. Aortic valve sclerosis is present, with no evidence  of aortic valve stenosis.   Comparison(s): Changes from prior study are noted. The left ventricular  function is worsened  Patient Profile     88 y.o. male with a hx of CAD s/p CABG x 5 2002, aortic valve insufficiency and borderline aortic stenosis, paroxysmal atrial fibrillation/flutter on chronic anticoagulation, severe TR, chronic combined  CHF, chronic venous insufficiency, mitral valve regurgitation, CKD IV, DM, HLD and hypertension presented with 3 weeks history of intermittent lower abdomen and epigastric/lower sternal pain (squeezing pressure) consistent with unstable angina/non-STEMI (peak troponin 2635), cardiac catheterization shows known chronic occlusion of SVG-OM and SVG-diagonal, with new 95% stenosis of the SVG-PDA-PLV and patent LIMA to LAD, echo shows interval marked decrease in LVEF to 30-35% with new inferior lateral wall motion abnormalities  Assessment & Plan    CHF: No dyspnea at rest or with light activity.  Appears clinically euvolemic.  Diuretics on hold to allow cardiac catheterization/PCI in the setting of advanced CKD.  Not on RAAS inhibitors for the same reason.  Plan to change to metoprolol succinate at discharge. CAD/NSTEMI: Plan today for staged PCI to SVG-PDA-PLV for new high-grade proximal graft stenosis.  Elevation in troponin is relatively mild compared with the extent of myocardium in jeopardy, hopefully this means that there is substantial viable myocardium that can be salvaged.  Planned staged PCI to SVG tomorrow if renal function allows.  Have already loaded on clopidogrel yesterday (preferred over Brilinta due to lower bleeding risk; plan for him to go back on direct oral anticoagulant once catheter based procedures are complete). CKD 4: Chronic moderate to severe renal dysfunction with creatinine baseline around 2.0-2.3, increased to 2.45 after diagnostic catheterization, slightly better 2.38 today.  Has been receiving IV fluids for last 24 hours.  Diuretics on hold avoid all other potential nephrotoxic agents. HLP: Maximum  dose rosuvastatin.  LDL less than 70.  Fenofibrate discontinued due to increased risk of toxicity with renal insufficiency. DM: Fair glycemic control with hemoglobin A1c 7.3% Parox Afib: Maintaining sinus rhythm at this time.  On chronic Eliquis 2.5 mg twice daily.   Percutaneous  revascularization/stent procedure has been fully reviewed with the patient and written informed consent has been obtained.     For questions or updates, please contact [place] Please consult [email or web address] for contact info under        Signed, Sanda Klein, MD  10/02/2021, 8:38 AM

## 2021-10-02 NOTE — Care Management Important Message (Signed)
Important Message  Patient Details  Name: Nathan [name] MRN: [id number] Date of Birth: [birth year]   Medicare Important Message Given:  Yes     [name] Montine Circle 10/02/2021, 1:55 PM

## 2021-10-03 DIAGNOSIS — I5020 Unspecified systolic (congestive) heart failure: Secondary | ICD-10-CM | POA: Diagnosis not present

## 2021-10-03 LAB — CBC
HCT: 34.4 % — ABNORMAL LOW (ref 39.0–52.0)
Hemoglobin: 11.5 g/dL — ABNORMAL LOW (ref 13.0–17.0)
MCH: 31.3 pg (ref 26.0–34.0)
MCHC: 33.4 g/dL (ref 30.0–36.0)
MCV: 93.5 fL (ref 80.0–100.0)
Platelets: 218 10*3/uL (ref 150–400)
RBC: 3.68 MIL/uL — ABNORMAL LOW (ref 4.22–5.81)
RDW: 13.8 % (ref 11.5–15.5)
WBC: 9.7 10*3/uL (ref 4.0–10.5)
nRBC: 0 % (ref 0.0–0.2)

## 2021-10-03 LAB — BASIC METABOLIC PANEL
Anion gap: 10 (ref 5–15)
BUN: 30 mg/dL — ABNORMAL HIGH (ref 8–23)
CO2: 15 mmol/L — ABNORMAL LOW (ref 22–32)
Calcium: 8.5 mg/dL — ABNORMAL LOW (ref 8.9–10.3)
Chloride: 113 mmol/L — ABNORMAL HIGH (ref 98–111)
Creatinine, Ser: 2.05 mg/dL — ABNORMAL HIGH (ref 0.61–1.24)
GFR, Estimated: 31 mL/min — ABNORMAL LOW (ref >60)
Glucose, Bld: 114 mg/dL — ABNORMAL HIGH (ref 70–99)
Potassium: 4.2 mmol/L (ref 3.5–5.1)
Sodium: 138 mmol/L (ref 135–145)

## 2021-10-03 LAB — GLUCOSE, CAPILLARY
Glucose-Capillary: 127 mg/dL — ABNORMAL HIGH (ref 70–99)
Glucose-Capillary: 158 mg/dL — ABNORMAL HIGH (ref 70–99)
Glucose-Capillary: 245 mg/dL — ABNORMAL HIGH (ref 70–99)
Glucose-Capillary: 227 mg/dL — ABNORMAL HIGH (ref 70–99)

## 2021-10-03 MED ORDER — FUROSEMIDE 40 MG PO TABS
80.0000 mg | ORAL_TABLET | Freq: Every day | ORAL | Status: DC
  Administered 2021-10-03 – 2021-10-05 (×3): 80 mg via ORAL
  Filled 2021-10-03 (×3): qty 2

## 2021-10-03 MED ORDER — METOPROLOL TARTRATE 25 MG PO TABS
37.5000 mg | ORAL_TABLET | Freq: Two times a day (BID) | ORAL | Status: DC
  Administered 2021-10-03 – 2021-10-04 (×2): 37.5 mg via ORAL
  Filled 2021-10-03 (×2): qty 1

## 2021-10-03 NOTE — Progress Notes (Signed)
CARDIAC REHAB PHASE I     Pt resting in bed feeling good this morning. Post stent education including site care, heart healthy diabetic diet, exercise guidelines, antiplatelet therapy importance, restrictions, MI booklet, and CRP2. All questions and concerns addressed. Will refer to [facility] for CRP2. Will continue to follow.  [phone]  Vanessa Barbara, RN BSN 10/03/2021 8:31 AM

## 2021-10-03 NOTE — Plan of Care (Signed)
  Problem: Education: Goal: Knowledge of General Education information will improve Description: Including pain rating scale, medication(s)/side effects and non-pharmacologic comfort measures Outcome: Progressing   Problem: Health Behavior/Discharge Planning: Goal: Ability to manage health-related needs will improve Outcome: Progressing   Problem: Clinical Measurements: Goal: Ability to maintain clinical measurements within normal limits will improve Outcome: Progressing Goal: Will remain free from infection Outcome: Progressing Goal: Diagnostic test results will improve Outcome: Progressing Goal: Respiratory complications will improve Outcome: Progressing Goal: Cardiovascular complication will be avoided Outcome: Progressing   Problem: Activity: Goal: Risk for activity intolerance will decrease Outcome: Progressing   Problem: Nutrition: Goal: Adequate nutrition will be maintained Outcome: Progressing   Problem: Coping: Goal: Level of anxiety will decrease Outcome: Progressing   Problem: Elimination: Goal: Will not experience complications related to bowel motility Outcome: Progressing Goal: Will not experience complications related to urinary retention Outcome: Progressing   Problem: Pain Managment: Goal: General experience of comfort will improve Outcome: Progressing   Problem: Safety: Goal: Ability to remain free from injury will improve Outcome: Progressing   Problem: Skin Integrity: Goal: Risk for impaired skin integrity will decrease Outcome: Progressing   Problem: Education: Goal: Ability to demonstrate management of disease process will improve Outcome: Progressing Goal: Ability to verbalize understanding of medication therapies will improve Outcome: Progressing Goal: Individualized Educational Video(s) Outcome: Progressing   Problem: Activity: Goal: Capacity to carry out activities will improve Outcome: Progressing   Problem: Cardiac: Goal:  Ability to achieve and maintain adequate cardiopulmonary perfusion will improve Outcome: Progressing   Problem: Education: Goal: Ability to describe self-care measures that may prevent or decrease complications (Diabetes Survival Skills Education) will improve Outcome: Progressing Goal: Individualized Educational Video(s) Outcome: Progressing   Problem: Coping: Goal: Ability to adjust to condition or change in health will improve Outcome: Progressing   Problem: Fluid Volume: Goal: Ability to maintain a balanced intake and output will improve Outcome: Progressing   Problem: Health Behavior/Discharge Planning: Goal: Ability to identify and utilize available resources and services will improve Outcome: Progressing Goal: Ability to manage health-related needs will improve Outcome: Progressing   Problem: Metabolic: Goal: Ability to maintain appropriate glucose levels will improve Outcome: Progressing   Problem: Nutritional: Goal: Maintenance of adequate nutrition will improve Outcome: Progressing Goal: Progress toward achieving an optimal weight will improve Outcome: Progressing   Problem: Skin Integrity: Goal: Risk for impaired skin integrity will decrease Outcome: Progressing   Problem: Tissue Perfusion: Goal: Adequacy of tissue perfusion will improve Outcome: Progressing   Problem: Education: Goal: Understanding of CV disease, CV risk reduction, and recovery process will improve Outcome: Progressing Goal: Individualized Educational Video(s) Outcome: Progressing   Problem: Activity: Goal: Ability to return to baseline activity level will improve Outcome: Progressing   Problem: Cardiovascular: Goal: Ability to achieve and maintain adequate cardiovascular perfusion will improve Outcome: Progressing Goal: Vascular access site(s) Level 0-1 will be maintained Outcome: Progressing   Problem: Health Behavior/Discharge Planning: Goal: Ability to safely manage  health-related needs after discharge will improve Outcome: Progressing

## 2021-10-03 NOTE — Progress Notes (Signed)
PROGRESS NOTE    Nathan [name]  WYO:[id number] DOB: [birth year] DOA: 09/28/2021 PCP: Donnajean Lopes, MD    Brief Narrative:  87-year gentleman with history of chronic congestive heart failure, paroxysmal A-fib on Eliquis, aortic insufficiency, coronary artery disease status post CABG, hypertension hyperlipidemia type 2 diabetes and CKD stage IV presented to the emergency room with intermittent abdominal pain for about 3 weeks.  Patient has chronic indwelling Foley catheter that was changed 5 days ago.  In the emergency room hemodynamically stable.  BNP 1791.  High-sensitivity troponins 2318-2635.  Patient admitted with NSTEMI and cardiology consultation. 8/16, underwent cardiac catheterization and found to have extensive coronary artery disease.  For a staged PCI.   Staged PCI 8/18 Right groin hematoma, transferred to ICU overnight and stabilized now.   Assessment & Plan:   Non-ST elevation MI in a patient with known coronary artery disease: Currently chest pain-free.  Remains on heparin infusion.  On metoprolol 25 mg twice daily.  Crestor 40 mg daily.   Cardiac cath with high-grade stenosis in the functional SVG to PDA and PLB, decreased left ventricular ejection fraction. Started on Plavix, underwent staged PCI to SVG. Cardiology following.  Currently chest pain-free.  Postprocedure hematoma stabilized.  Conservative measures.  Eliquis on hold.  Acute on chronic systolic congestive heart failure: Symptomatic on arrival.  Elevated BNP.  Echocardiogram with known ejection fraction 30 to 35%.  Moderate to severe mitral regurgitation. Treated with IV diuresis, euvolemic.  Unable to tolerate GDMT.  Diuretics on hold to make room for contrasted studies.    CKD stage IV: Creatinine at about baseline. 2.3-2.7 in his outpatient reports.  Close follow-up on treatment, diuretics and cardiac cath.  Hyperlipidemia: On Crestor.  Type 2 diabetes: A1c 7.3.  Fairly controlled.  On  insulin.  Electrolytes: Magnesium and potassium replaced.  Adequate.  BPH with urinary retention/chronic indwelling Foley catheter: Stable.  Continue Proscar.  Paroxysmal A-fib: Currently sinus rhythm.  On Eliquis.  Rate controlled.  Eliquis replaced with heparin perioperative. Aspirin and Eliquis on hold today because of significant hematoma.  Planning to resume tomorrow.  E. coli bacteriuria: Recent catheter exchange.  No evidence of systemic infection.  E. coli more than 100,000 colonies.  Will not treat with antibiotics.  Continue mobilize.  Transfer out of ICU.  Anticipate home tomorrow.   DVT prophylaxis: SCD's Start: 09/30/21 1422  Heparin infusion    Code Status: full Code Family Communication: None. Disposition Plan: Status is: Inpatient Remains inpatient appropriate because: Inpatient cardiac procedures planned     Consultants:  Cardiology  Procedures:  Cath, plan  Antimicrobials:  None   Subjective: Seen and examined.  Currently denies any complaints.  After cardiac cath he developed significant swelling right groin needed constant pressure. Patient describes of rough timing due to continuous attention to his groin and pressure sensation.  Currently improved.  Objective: Vitals:   10/03/21 0830 10/03/21 0900 10/03/21 0930 10/03/21 1000  BP: (!) 146/69 (!) 151/60 (!) 120/54 119/63  Pulse: 93 94 74 77  Resp: (!) 21 (!) 27 (!) 23 (!) 26  Temp:      TempSrc:      SpO2: 92% 97% 93% 94%  Weight:      Height:        Intake/Output Summary (Last 24 hours) at 10/03/2021 1053 Last data filed at 10/03/2021 0900 Gross per 24 hour  Intake 1496.6 ml  Output 1100 ml  Net 396.6 ml   Filed Weights   10/01/21 0614 10/02/21  0814 10/03/21 0500  Weight: 70.2 kg 71.8 kg 70.6 kg    Examination:  General exam: Calm and comfortable.  On room air. Respiratory system: No added sounds. Cardiovascular system: S1 & S2 heard, RRR. No pedal edema. Gastrointestinal system:  Soft.  Nontender.  Bowel sound present.   Right groin with ecchymosis, mild swelling.  Distal vasculature intact. Foley catheter with clear urine.   Central nervous system: Alert and oriented. No focal neurological deficits. Extremities: Symmetric 5 x 5 power.     Data Reviewed: I have personally reviewed following labs and imaging studies  CBC: Recent Labs  Lab 09/29/21 0750 09/30/21 0316 10/01/21 0044 10/02/21 0055 10/02/21 1941 10/03/21 0606  WBC 10.7* 10.7* 8.8 9.0  --  9.7  HGB 13.4 14.1 12.2* 12.6* 13.0 11.5*  HCT 39.9 40.1 35.3* 37.6* 38.7* 34.4*  MCV 91.7 88.3 90.3 92.6  --  93.5  PLT 242 243 212 216  --  481   Basic Metabolic Panel: Recent Labs  Lab 09/29/21 0750 09/30/21 0316 10/01/21 0044 10/02/21 0055 10/03/21 0606  NA 141 135 134* 137 138  K 3.9 3.6 4.2 4.3 4.2  CL 110 105 108 110 113*  CO2 23 23 20* 20* 15*  GLUCOSE 233* 177* 247* 119* 114*  BUN 29* 37* 37* 37* 30*  CREATININE 2.10* 2.29* 2.45* 2.38* 2.05*  CALCIUM 9.6 9.3 8.3* 8.5* 8.5*  MG 1.6*  --   --   --   --    GFR: Estimated Creatinine Clearance: 26.3 mL/min (A) (by C-G formula based on SCr of 2.05 mg/dL (H)). Liver Function Tests: Recent Labs  Lab 09/28/21 1627  AST 51*  ALT 28  ALKPHOS 42  BILITOT 0.9  PROT 6.8  ALBUMIN 3.2*   Recent Labs  Lab 09/28/21 1627  LIPASE 40   No results for input(s): "AMMONIA" in the last 168 hours. Coagulation Profile: No results for input(s): "INR", "PROTIME" in the last 168 hours. Cardiac Enzymes: No results for input(s): "CKTOTAL", "CKMB", "CKMBINDEX", "TROPONINI" in the last 168 hours. BNP (last 3 results) No results for input(s): "PROBNP" in the last 8760 hours. HbA1C: No results for input(s): "HGBA1C" in the last 72 hours.  CBG: Recent Labs  Lab 10/01/21 2158 10/02/21 0609 10/02/21 1219 10/02/21 2245 10/03/21 0623  GLUCAP 132* 102* 74 105* 127*   Lipid Profile: No results for input(s): "CHOL", "HDL", "LDLCALC", "TRIG",  "CHOLHDL", "LDLDIRECT" in the last 72 hours.  Thyroid Function Tests: No results for input(s): "TSH", "T4TOTAL", "FREET4", "T3FREE", "THYROIDAB" in the last 72 hours. Anemia Panel: No results for input(s): "VITAMINB12", "FOLATE", "FERRITIN", "TIBC", "IRON", "RETICCTPCT" in the last 72 hours. Sepsis Labs: No results for input(s): "PROCALCITON", "LATICACIDVEN" in the last 168 hours.  Recent Results (from the past 240 hour(s))  Urine Culture     Status: Abnormal   Collection Time: 09/28/21 10:48 PM   Specimen: Urine, Clean Catch  Result Value Ref Range Status   Specimen Description   Final    URINE, CLEAN CATCH Performed at [facility], [address] [place], [place] [address]    Special Requests   Final    NONE Performed at [facility], [address] [place], [place] [address]    Culture >=100,000 COLONIES/mL ESCHERICHIA COLI (A)  Final   Report Status 10/01/2021 FINAL  Final   Organism ID, Bacteria ESCHERICHIA COLI (A)  Final      Susceptibility   Escherichia coli - MIC*  AMPICILLIN >=32 RESISTANT Resistant     CEFAZOLIN <=4 SENSITIVE Sensitive     CEFEPIME <=0.12 SENSITIVE Sensitive     CEFTRIAXONE <=0.25 SENSITIVE Sensitive     CIPROFLOXACIN <=0.25 SENSITIVE Sensitive     GENTAMICIN <=1 SENSITIVE Sensitive     IMIPENEM <=0.25 SENSITIVE Sensitive     NITROFURANTOIN <=16 SENSITIVE Sensitive     TRIMETH/SULFA <=20 SENSITIVE Sensitive     AMPICILLIN/SULBACTAM 16 INTERMEDIATE Intermediate     PIP/TAZO <=4 SENSITIVE Sensitive     * >=100,000 COLONIES/mL ESCHERICHIA COLI  MRSA Next Gen by PCR, Nasal     Status: Abnormal   Collection Time: 09/29/21  5:05 AM   Specimen: Nasal Mucosa; Nasal Swab  Result Value Ref Range Status   MRSA by PCR Next Gen DETECTED (A) NOT DETECTED Final    Comment: CRITICAL RESULT CALLED TO, READ BACK BY AND VERIFIED WITH: C/ ATommie Raymond, RN 09/29/21 0625 A. LAFRANCE (NOTE) The GeneXpert MRSA Assay (FDA approved for NASAL  specimens only), is one component of a comprehensive MRSA colonization surveillance program. It is not intended to diagnose MRSA infection nor to guide or monitor treatment for MRSA infections. Test performance is not FDA approved in patients less than 53 years old. Performed at [facility], [place] [address] [place], [place] [address]          Radiology Studies: CT ABDOMEN PELVIS WO CONTRAST  Result Date: 10/02/2021 CLINICAL DATA:  Evaluate for retroperitoneal bleed. EXAM: CT ABDOMEN AND PELVIS WITHOUT CONTRAST TECHNIQUE: Multidetector CT imaging of the abdomen and pelvis was performed following the standard protocol without IV contrast. RADIATION DOSE REDUCTION: This exam was performed according to the departmental dose-optimization program which includes automated exposure control, adjustment of the mA and/or kV according to patient size and/or use of iterative reconstruction technique. COMPARISON:  CT abdomen pelvis dated 09/28/2021. FINDINGS: Evaluation of this exam is limited in the absence of intravenous contrast. Lower chest: The visualized lung bases are clear. There is coronary vascular calcification. No intra-abdominal free air or free fluid. Hepatobiliary: No focal liver abnormality is seen. No gallstones, gallbladder wall thickening, or biliary dilatation. Pancreas: Unremarkable. No pancreatic ductal dilatation or surrounding inflammatory changes. Spleen: Normal in size without focal abnormality. Adrenals/Urinary Tract: The adrenal glands unremarkable. Mild bilateral renal parenchyma atrophy. There is no hydronephrosis on either side. Residual contrast noted in the renal collecting systems bilaterally. The visualized ureters appear unremarkable. The gallbladder is minimally distended and contains excreted contrast from recent study. A Foley catheter is noted in the bladder. There is trabeculated appearance of the bladder wall consistent with chronic bladder outlet obstruction.  Stomach/Bowel: Small hiatal hernia. There is sigmoid diverticulosis and several small scattered colonic diverticula without active inflammatory changes. Distal duodenal diverticulum also noted. There is no bowel obstruction or active inflammation. The appendix is normal. Vascular/Lymphatic: Moderate aortoiliac atherosclerotic disease. The IVC is unremarkable. No portal venous gas. There is no adenopathy. Reproductive: Enlarged prostate gland measuring 6 cm in transverse axial diameter. Other: There is stranding of the subcutaneous soft tissues of the right groin. No large fluid collection or hematoma. A compression device noted over the right groin. Musculoskeletal: Bilateral L5 pars defects with grade 1 L5-S1 anterolisthesis. Degenerative changes of the spine. No acute osseous pathology. IMPRESSION: 1. No acute intra-abdominal or pelvic pathology. 2. Stranding of the subcutaneous soft tissues of the right groin. No large fluid collection or hematoma. 3. Colonic diverticulosis. No bowel obstruction. Normal appendix. 4. Enlarged prostate gland with findings of  chronic bladder outlet obstruction. 5. Aortic Atherosclerosis (ICD10-I70.0). Electronically Signed   By: Anner Crete M.D.   On: 10/02/2021 22:16   CARDIAC CATHETERIZATION  Result Date: 10/02/2021   Origin lesion before RPDA  is 95% stenosed.   A drug-eluting stent was successfully placed using a SYNERGY XD [id number].   Post intervention, there is a 0% residual stenosis. Successful PCI of SVG to RCA with DES x 1 Plan: DAPT with ASA 81 mg for one month and Plavix 12 months. If no bleeding issues plan to resume Eliquis 2.5 mg bid tomorrow.        Scheduled Meds:  aspirin  81 mg Oral Daily   Chlorhexidine Gluconate Cloth  6 each Topical Daily   clopidogrel  75 mg Oral Daily   finasteride  5 mg Oral Daily   furosemide  80 mg Oral Daily   insulin aspart  0-6 Units Subcutaneous TID WC   insulin aspart protamine- aspart  10 Units Subcutaneous BID WC    magnesium oxide  400 mg Oral Daily   megestrol  20 mg Oral Daily   metoprolol tartrate  37.5 mg Oral BID   pantoprazole  40 mg Oral Daily   rosuvastatin  40 mg Oral Daily   sodium bicarbonate  650 mg Oral BID   sodium chloride flush  3 mL Intravenous Q12H   sodium chloride flush  3 mL Intravenous Q12H   sodium chloride flush  3 mL Intravenous Q12H   sodium chloride flush  3 mL Intravenous Q12H   sodium chloride flush  3 mL Intravenous Q12H   Continuous Infusions:  sodium chloride     sodium chloride     sodium chloride 100 mL/hr at 10/03/21 0800   sodium chloride Stopped (10/03/21 0436)     LOS: 4 days    Time spent: 35 minutes    Barb Merino, MD [facility] Pager [phone]

## 2021-10-03 NOTE — Progress Notes (Signed)
Progress Note  Patient Name: Nathan [name] Date of Encounter: 10/03/2021  [facility] Cardiologist: [name], MD   Subjective   Had problems with groin hematoma last evening, things have settled down.  Hemoglobin has dropped from the 12-13 range to 11.0 and there is no sign of active bleeding. Denies angina and dyspnea, but to me seems to be breathing a little fast. Creatinine improved to 2.05, well within his baseline. Has had atrial fibrillation with mild RVR.  Inpatient Medications    Scheduled Meds:  aspirin  81 mg Oral Daily   Chlorhexidine Gluconate Cloth  6 each Topical Daily   clopidogrel  75 mg Oral Daily   finasteride  5 mg Oral Daily   insulin aspart  0-6 Units Subcutaneous TID WC   insulin aspart protamine- aspart  10 Units Subcutaneous BID WC   magnesium oxide  400 mg Oral Daily   megestrol  20 mg Oral Daily   metoprolol tartrate  25 mg Oral BID   pantoprazole  40 mg Oral Daily   rosuvastatin  40 mg Oral Daily   sodium bicarbonate  650 mg Oral BID   sodium chloride flush  3 mL Intravenous Q12H   sodium chloride flush  3 mL Intravenous Q12H   sodium chloride flush  3 mL Intravenous Q12H   sodium chloride flush  3 mL Intravenous Q12H   sodium chloride flush  3 mL Intravenous Q12H   Continuous Infusions:  sodium chloride     sodium chloride     sodium chloride 100 mL/hr at 10/03/21 0800   sodium chloride Stopped (10/03/21 0436)   PRN Meds: sodium chloride, sodium chloride, acetaminophen, albuterol, ondansetron (ZOFRAN) IV, mouth rinse, sodium chloride flush, sodium chloride flush   Vital Signs    Vitals:   10/03/21 0630 10/03/21 0645 10/03/21 0700 10/03/21 0800  BP: 134/65  (!) 167/78 (!) 170/69  Pulse: 81 82 79 65  Resp: 20 (!) 22 (!) 28 20  Temp:      TempSrc:      SpO2: 94% 95% 97% 94%  Weight:      Height:        Intake/Output Summary (Last 24 hours) at 10/03/2021 0809 Last data filed at 10/03/2021 0800 Gross per 24 hour  Intake  1256.6 ml  Output 1100 ml  Net 156.6 ml      10/03/2021    5:00 AM 10/02/2021    5:21 AM 10/01/2021    6:14 AM  Last 3 Weights  Weight (lbs) 155 lb 10.3 oz 158 lb 4.6 oz 154 lb 12.2 oz  Weight (kg) 70.6 kg 71.8 kg 70.2 kg      Telemetry    Atrial fibrillation with mild RVR, sinus rhythm with first-degree AV block and frequent PACs- Personally Reviewed  ECG    Sinus rhythm with first-degree AV block and frequent PACs, old right bundle branch block, left axis deviation- Personally Reviewed  Physical Exam  Thin, looks comfortable but a little tachypnea GEN: No acute distress.   Neck: No JVD Cardiac: Regular baseline with frequent ectopy, widely split S2, no murmurs, rubs, or gallops.  Respiratory: Clear to auscultation bilaterally. GI: Soft, nontender, non-distended  MS: No edema; No deformity. Neuro:  Nonfocal  Psych: Normal affect   Labs    High Sensitivity Troponin:   Recent Labs  Lab 09/28/21 1940 09/28/21 2121  TROPONINIHS 2,318* 2,635*     Chemistry Recent Labs  Lab 09/28/21 1627 09/29/21 0750 09/30/21 0316 10/01/21 0044 10/02/21  0055 10/03/21 0606  NA 139 141   < > 134* 137 138  K 3.8 3.9   < > 4.2 4.3 4.2  CL 110 110   < > 108 110 113*  CO2 21* 23   < > 20* 20* 15*  GLUCOSE 281* 233*   < > 247* 119* 114*  BUN 34* 29*   < > 37* 37* 30*  CREATININE 2.18* 2.10*   < > 2.45* 2.38* 2.05*  CALCIUM 9.3 9.6   < > 8.3* 8.5* 8.5*  MG  --  1.6*  --   --   --   --   PROT 6.8  --   --   --   --   --   ALBUMIN 3.2*  --   --   --   --   --   AST 51*  --   --   --   --   --   ALT 28  --   --   --   --   --   ALKPHOS 42  --   --   --   --   --   BILITOT 0.9  --   --   --   --   --   GFRNONAA 29* 30*   < > 25* 26* 31*  ANIONGAP 8 8   < > '6 7 10   '$ < > = values in this interval not displayed.    Lipids  Recent Labs  Lab 09/30/21 0316  CHOL 129  TRIG 106  HDL 40*  LDLCALC 68  CHOLHDL 3.2    Hematology Recent Labs  Lab 10/01/21 0044 10/02/21 0055  10/02/21 1941 10/03/21 0606  WBC 8.8 9.0  --  9.7  RBC 3.91* 4.06*  --  3.68*  HGB 12.2* 12.6* 13.0 11.5*  HCT 35.3* 37.6* 38.7* 34.4*  MCV 90.3 92.6  --  93.5  MCH 31.2 31.0  --  31.3  MCHC 34.6 33.5  --  33.4  RDW 13.8 13.7  --  13.8  PLT 212 216  --  218   Thyroid No results for input(s): "TSH", "FREET4" in the last 168 hours.  BNP Recent Labs  Lab 09/28/21 1627  BNP 1,791.6*    DDimer No results for input(s): "DDIMER" in the last 168 hours.   Radiology    CT ABDOMEN PELVIS WO CONTRAST  Result Date: 10/02/2021 CLINICAL DATA:  Evaluate for retroperitoneal bleed. EXAM: CT ABDOMEN AND PELVIS WITHOUT CONTRAST TECHNIQUE: Multidetector CT imaging of the abdomen and pelvis was performed following the standard protocol without IV contrast. RADIATION DOSE REDUCTION: This exam was performed according to the departmental dose-optimization program which includes automated exposure control, adjustment of the mA and/or kV according to patient size and/or use of iterative reconstruction technique. COMPARISON:  CT abdomen pelvis dated 09/28/2021. FINDINGS: Evaluation of this exam is limited in the absence of intravenous contrast. Lower chest: The visualized lung bases are clear. There is coronary vascular calcification. No intra-abdominal free air or free fluid. Hepatobiliary: No focal liver abnormality is seen. No gallstones, gallbladder wall thickening, or biliary dilatation. Pancreas: Unremarkable. No pancreatic ductal dilatation or surrounding inflammatory changes. Spleen: Normal in size without focal abnormality. Adrenals/Urinary Tract: The adrenal glands unremarkable. Mild bilateral renal parenchyma atrophy. There is no hydronephrosis on either side. Residual contrast noted in the renal collecting systems bilaterally. The visualized ureters appear unremarkable. The gallbladder is minimally distended and contains excreted contrast from recent study. A Foley catheter  is noted in the bladder. There  is trabeculated appearance of the bladder wall consistent with chronic bladder outlet obstruction. Stomach/Bowel: Small hiatal hernia. There is sigmoid diverticulosis and several small scattered colonic diverticula without active inflammatory changes. Distal duodenal diverticulum also noted. There is no bowel obstruction or active inflammation. The appendix is normal. Vascular/Lymphatic: Moderate aortoiliac atherosclerotic disease. The IVC is unremarkable. No portal venous gas. There is no adenopathy. Reproductive: Enlarged prostate gland measuring 6 cm in transverse axial diameter. Other: There is stranding of the subcutaneous soft tissues of the right groin. No large fluid collection or hematoma. A compression device noted over the right groin. Musculoskeletal: Bilateral L5 pars defects with grade 1 L5-S1 anterolisthesis. Degenerative changes of the spine. No acute osseous pathology. IMPRESSION: 1. No acute intra-abdominal or pelvic pathology. 2. Stranding of the subcutaneous soft tissues of the right groin. No large fluid collection or hematoma. 3. Colonic diverticulosis. No bowel obstruction. Normal appendix. 4. Enlarged prostate gland with findings of chronic bladder outlet obstruction. 5. Aortic Atherosclerosis (ICD10-I70.0). Electronically Signed   By: Anner Crete M.D.   On: 10/02/2021 22:16   CARDIAC CATHETERIZATION  Result Date: 10/02/2021   Origin lesion before RPDA  is 95% stenosed.   A drug-eluting stent was successfully placed using a SYNERGY XD 3.50X16.   Post intervention, there is a 0% residual stenosis. Successful PCI of SVG to RCA with DES x 1 Plan: DAPT with ASA 81 mg for one month and Plavix 12 months. If no bleeding issues plan to resume Eliquis 2.5 mg bid tomorrow.    Cardiac Studies  10/02/2021 CORONARY STENT INTERVENTION   Conclusion      Origin lesion before RPDA  is 95% stenosed.   A drug-eluting stent was successfully placed using a SYNERGY XD 3.50X16.   Post  intervention, there is a 0% residual stenosis.   Successful PCI of SVG to RCA with DES x 1   Plan: DAPT with ASA 81 mg for one month and Plavix 12 months. If no bleeding issues plan to resume Eliquis 2.5 mg bid tomorrow.     Cardiac catheterization 09/30/2021  Severe multivessel CAD with 95% distal left main stenosis, 95 to 99% proximal LAD stenosis; 95-99% proximal circumflex stenosis before a diminutive OM1 vessel with 99% stenosis; and total occlusion of the proximal RCA after a proximal branch.   Patent LIMA to mid LAD.   Old occlusion of the SVG which had supplied the diagonal vessel.   Old occlusion of the SVG which had supplied the circumflex marginal vessel.   New 95% near ostial/proximal SVG graft stenosis in the sequential graft supplying the PDA and PLA vessel.   LV EDP 6 mmHg.   RECOMMENDATION: Patient recently presented with increasing chest pain.  Troponins have increased to 2635 most likely due to the new subtotal near ostial/proximal graft stenosis in the sequential graft supplying the PDA and PLA vessels.  The patient will be hydrated post procedure with his stage IV CKD.  We will have colleagues review and most likely will require attempt at staged PCI to the SVG supplying the PDA and PLA system of the RCA.     Diagnostic Dominance: Right        Echocardiogram 09/29/2021    1. Left ventricular ejection fraction, by estimation, is 30 to 35%. The  left ventricle has moderately decreased function. The left ventricle  demonstrates regional wall motion abnormalities (see scoring  diagram/findings for description). The left  ventricular internal cavity size was mildly  dilated. There is mild  concentric left ventricular hypertrophy. Indeterminate diastolic filling  due to E-A fusion.   2. Right ventricular systolic function is normal. The right ventricular  size is normal.   3. Left atrial size was severely dilated.   4. The mitral valve is abnormal. Moderate to  severe mitral valve  regurgitation. No evidence of mitral stenosis.   5. The aortic valve is tricuspid. There is mild calcification of the  aortic valve. There is mild thickening of the aortic valve. Aortic valve  regurgitation is mild. Aortic valve sclerosis is present, with no evidence  of aortic valve stenosis.   Comparison(s): Changes from prior study are noted. The left ventricular  function is worsened  Patient Profile     88 y.o. male with a hx of CAD s/p CABG x 5 2002, aortic valve insufficiency and borderline aortic stenosis, paroxysmal atrial fibrillation/flutter on chronic anticoagulation, severe TR, chronic combined CHF, chronic venous insufficiency, mitral valve regurgitation, CKD IV, DM, HLD and hypertension presented with 3 weeks history of intermittent lower abdomen and epigastric/lower sternal pain (squeezing pressure) consistent with unstable angina/non-STEMI (peak troponin 2635), cardiac catheterization shows known chronic occlusion of SVG-OM and SVG-diagonal, with new 95% stenosis of the SVG-PDA-PLV and patent LIMA to LAD, echo shows interval marked decrease in LVEF to 30-35% with new inferior lateral wall motion abnormalities, s/p PCI-DES 10/02/2021  Assessment & Plan    CHF: He denies dyspnea, but seems to breathing a little fast.  We will restart his diuretics.  We did hydrate him before his 2 catheterization procedures.    Not on RAAS inhibitors due to advanced CKD and I do not think he should receive SGLT2 inhibitors for the same reason.  Plan to change metoprolol tartrate to metoprolol succinate at discharge. CAD/NSTEMI: Successful PCI yesterday, but complicated by small groin hematoma.  Denies angina.  Hopefully will see recovery of inferior wall function over time with reduction in mitral insufficiency and improvement in CHF.  On aspirin and clopidogrel, plan to discharge on Eliquis as well, stopping aspirin after 30 days. CKD 4: Creatinine at baseline despite 2 contrast  based procedures this week.  Starting on diuretics.  Avoid other nephrotoxic agents. HLP: Maximum dose rosuvastatin.  LDL less than 70.  Fenofibrate discontinued due to increased risk of toxicity with renal insufficiency. DM: Fair glycemic control with hemoglobin A1c 7.3% Parox Afib: Hold off resuming Eliquis today due to groin hematoma.  Plan to discharge on Eliquis.   Okay to transfer out of ICU  For questions or updates, please contact [facility] Please consult [email or web address] for contact info under        Signed, Sanda Klein, MD  10/03/2021, 8:09 AM

## 2021-10-04 DIAGNOSIS — I5020 Unspecified systolic (congestive) heart failure: Secondary | ICD-10-CM | POA: Diagnosis not present

## 2021-10-04 LAB — GLUCOSE, CAPILLARY
Glucose-Capillary: 132 mg/dL — ABNORMAL HIGH (ref 70–99)
Glucose-Capillary: 220 mg/dL — ABNORMAL HIGH (ref 70–99)
Glucose-Capillary: 215 mg/dL — ABNORMAL HIGH (ref 70–99)
Glucose-Capillary: 148 mg/dL — ABNORMAL HIGH (ref 70–99)

## 2021-10-04 LAB — BASIC METABOLIC PANEL
Anion gap: 8 (ref 5–15)
BUN: 39 mg/dL — ABNORMAL HIGH (ref 8–23)
CO2: 18 mmol/L — ABNORMAL LOW (ref 22–32)
Calcium: 8.4 mg/dL — ABNORMAL LOW (ref 8.9–10.3)
Chloride: 111 mmol/L (ref 98–111)
Creatinine, Ser: 2.3 mg/dL — ABNORMAL HIGH (ref 0.61–1.24)
GFR, Estimated: 27 mL/min — ABNORMAL LOW (ref >60)
Glucose, Bld: 134 mg/dL — ABNORMAL HIGH (ref 70–99)
Potassium: 3.9 mmol/L (ref 3.5–5.1)
Sodium: 137 mmol/L (ref 135–145)

## 2021-10-04 LAB — CBC
HCT: 30.4 % — ABNORMAL LOW (ref 39.0–52.0)
Hemoglobin: 10.4 g/dL — ABNORMAL LOW (ref 13.0–17.0)
MCH: 31.1 pg (ref 26.0–34.0)
MCHC: 34.2 g/dL (ref 30.0–36.0)
MCV: 91 fL (ref 80.0–100.0)
Platelets: 174 10*3/uL (ref 150–400)
RBC: 3.34 MIL/uL — ABNORMAL LOW (ref 4.22–5.81)
RDW: 13.6 % (ref 11.5–15.5)
WBC: 9 10*3/uL (ref 4.0–10.5)
nRBC: 0 % (ref 0.0–0.2)

## 2021-10-04 MED ORDER — METOPROLOL TARTRATE 25 MG PO TABS
25.0000 mg | ORAL_TABLET | Freq: Once | ORAL | Status: AC
  Administered 2021-10-04: 25 mg via ORAL
  Filled 2021-10-04: qty 1

## 2021-10-04 MED ORDER — METOPROLOL TARTRATE 50 MG PO TABS
50.0000 mg | ORAL_TABLET | Freq: Two times a day (BID) | ORAL | Status: DC
  Administered 2021-10-04 – 2021-10-05 (×2): 50 mg via ORAL
  Filled 2021-10-04 (×2): qty 1

## 2021-10-04 NOTE — Progress Notes (Signed)
Progress Note  Patient Name: Nathan [name] Date of Encounter: 10/04/2021  [facility] Cardiologist: [name], MD   Subjective   Denies angina, dyspnea or any discomfort at right groin hematoma site (unless pressed on).  Went back into atrial fibrillation with rapid ventricular response.   Still with heart rate 100-110 at rest after receiving his morning dose of beta-blocker. Renal function nominally higher than yesterday, but hovering in his usual range of 2.0-2.3.  Slight reduction in hemoglobin, but no overt bleeding.  Inpatient Medications    Scheduled Meds:  aspirin  81 mg Oral Daily   Chlorhexidine Gluconate Cloth  6 each Topical Daily   clopidogrel  75 mg Oral Daily   finasteride  5 mg Oral Daily   furosemide  80 mg Oral Daily   insulin aspart  0-6 Units Subcutaneous TID WC   insulin aspart protamine- aspart  10 Units Subcutaneous BID WC   magnesium oxide  400 mg Oral Daily   megestrol  20 mg Oral Daily   metoprolol tartrate  25 mg Oral Once   metoprolol tartrate  50 mg Oral BID   pantoprazole  40 mg Oral Daily   rosuvastatin  40 mg Oral Daily   sodium bicarbonate  650 mg Oral BID   sodium chloride flush  3 mL Intravenous Q12H   sodium chloride flush  3 mL Intravenous Q12H   sodium chloride flush  3 mL Intravenous Q12H   sodium chloride flush  3 mL Intravenous Q12H   sodium chloride flush  3 mL Intravenous Q12H   Continuous Infusions:  sodium chloride     sodium chloride     PRN Meds: sodium chloride, sodium chloride, acetaminophen, albuterol, ondansetron (ZOFRAN) IV, mouth rinse, sodium chloride flush, sodium chloride flush   Vital Signs    Vitals:   10/04/21 0500 10/04/21 0600 10/04/21 0802 10/04/21 0820  BP: 135/66 121/78  (!) 144/61  Pulse: 69 69  (!) 146  Resp: 17 20    Temp:   98.8 F (37.1 C)   TempSrc:   Oral   SpO2: 94% 94%    Weight: 72.1 kg     Height:        Intake/Output Summary (Last 24 hours) at 10/04/2021 0914 Last data filed  at 10/04/2021 0630 Gross per 24 hour  Intake 1101.46 ml  Output 1765 ml  Net -663.54 ml      10/04/2021    5:00 AM 10/03/2021    5:00 AM 10/02/2021    5:21 AM  Last 3 Weights  Weight (lbs) 158 lb 15.2 oz 155 lb 10.3 oz 158 lb 4.6 oz  Weight (kg) 72.1 kg 70.6 kg 71.8 kg      Telemetry    Atrial fibrillation with rapid ventricular response- Personally Reviewed  ECG    No new tracing- Personally Reviewed  Physical Exam  Appears well GEN: No acute distress.   Neck: No JVD Cardiac: Irregular, wide split second heart sound, grade 1-2/6 early peaking aortic ejection murmur, 2/6 holosystolic murmur at apex, no diastolic murmurs, rubs, or gallops.  Unchanged right groin ecchymosis, no firm hematoma. Respiratory: Clear to auscultation bilaterally. GI: Soft, nontender, non-distended  MS: No edema; No deformity. Neuro:  Nonfocal  Psych: Normal affect   Labs    High Sensitivity Troponin:   Recent Labs  Lab 09/28/21 1940 09/28/21 2121  TROPONINIHS 2,318* 2,635*     Chemistry Recent Labs  Lab 09/28/21 1627 09/29/21 0750 09/30/21 0316 10/02/21 0055 10/03/21 2263  10/04/21 0614  NA 139 141   < > 137 138 137  K 3.8 3.9   < > 4.3 4.2 3.9  CL 110 110   < > 110 113* 111  CO2 21* 23   < > 20* 15* 18*  GLUCOSE 281* 233*   < > 119* 114* 134*  BUN 34* 29*   < > 37* 30* 39*  CREATININE 2.18* 2.10*   < > 2.38* 2.05* 2.30*  CALCIUM 9.3 9.6   < > 8.5* 8.5* 8.4*  MG  --  1.6*  --   --   --   --   PROT 6.8  --   --   --   --   --   ALBUMIN 3.2*  --   --   --   --   --   AST 51*  --   --   --   --   --   ALT 28  --   --   --   --   --   ALKPHOS 42  --   --   --   --   --   BILITOT 0.9  --   --   --   --   --   GFRNONAA 29* 30*   < > 26* 31* 27*  ANIONGAP 8 8   < > '7 10 8   '$ < > = values in this interval not displayed.    Lipids  Recent Labs  Lab 09/30/21 0316  CHOL 129  TRIG 106  HDL 40*  LDLCALC 68  CHOLHDL 3.2    Hematology Recent Labs  Lab 10/02/21 0055  10/02/21 1941 10/03/21 0606 10/04/21 0614  WBC 9.0  --  9.7 9.0  RBC 4.06*  --  3.68* 3.34*  HGB 12.6* 13.0 11.5* 10.4*  HCT 37.6* 38.7* 34.4* 30.4*  MCV 92.6  --  93.5 91.0  MCH 31.0  --  31.3 31.1  MCHC 33.5  --  33.4 34.2  RDW 13.7  --  13.8 13.6  PLT 216  --  218 174   Thyroid No results for input(s): "TSH", "FREET4" in the last 168 hours.  BNP Recent Labs  Lab 09/28/21 1627  BNP 1,791.6*    DDimer No results for input(s): "DDIMER" in the last 168 hours.   Radiology    CT ABDOMEN PELVIS WO CONTRAST  Result Date: 10/02/2021 CLINICAL DATA:  Evaluate for retroperitoneal bleed. EXAM: CT ABDOMEN AND PELVIS WITHOUT CONTRAST TECHNIQUE: Multidetector CT imaging of the abdomen and pelvis was performed following the standard protocol without IV contrast. RADIATION DOSE REDUCTION: This exam was performed according to the departmental dose-optimization program which includes automated exposure control, adjustment of the mA and/or kV according to patient size and/or use of iterative reconstruction technique. COMPARISON:  CT abdomen pelvis dated 09/28/2021. FINDINGS: Evaluation of this exam is limited in the absence of intravenous contrast. Lower chest: The visualized lung bases are clear. There is coronary vascular calcification. No intra-abdominal free air or free fluid. Hepatobiliary: No focal liver abnormality is seen. No gallstones, gallbladder wall thickening, or biliary dilatation. Pancreas: Unremarkable. No pancreatic ductal dilatation or surrounding inflammatory changes. Spleen: Normal in size without focal abnormality. Adrenals/Urinary Tract: The adrenal glands unremarkable. Mild bilateral renal parenchyma atrophy. There is no hydronephrosis on either side. Residual contrast noted in the renal collecting systems bilaterally. The visualized ureters appear unremarkable. The gallbladder is minimally distended and contains excreted contrast from recent study. A Foley catheter is  noted in the  bladder. There is trabeculated appearance of the bladder wall consistent with chronic bladder outlet obstruction. Stomach/Bowel: Small hiatal hernia. There is sigmoid diverticulosis and several small scattered colonic diverticula without active inflammatory changes. Distal duodenal diverticulum also noted. There is no bowel obstruction or active inflammation. The appendix is normal. Vascular/Lymphatic: Moderate aortoiliac atherosclerotic disease. The IVC is unremarkable. No portal venous gas. There is no adenopathy. Reproductive: Enlarged prostate gland measuring 6 cm in transverse axial diameter. Other: There is stranding of the subcutaneous soft tissues of the right groin. No large fluid collection or hematoma. A compression device noted over the right groin. Musculoskeletal: Bilateral L5 pars defects with grade 1 L5-S1 anterolisthesis. Degenerative changes of the spine. No acute osseous pathology. IMPRESSION: 1. No acute intra-abdominal or pelvic pathology. 2. Stranding of the subcutaneous soft tissues of the right groin. No large fluid collection or hematoma. 3. Colonic diverticulosis. No bowel obstruction. Normal appendix. 4. Enlarged prostate gland with findings of chronic bladder outlet obstruction. 5. Aortic Atherosclerosis (ICD10-I70.0). Electronically Signed   By: Anner Crete M.D.   On: 10/02/2021 22:16   CARDIAC CATHETERIZATION  Result Date: 10/02/2021   Origin lesion before RPDA  is 95% stenosed.   A drug-eluting stent was successfully placed using a SYNERGY XD 3.50X16.   Post intervention, there is a 0% residual stenosis. Successful PCI of SVG to RCA with DES x 1 Plan: DAPT with ASA 81 mg for one month and Plavix 12 months. If no bleeding issues plan to resume Eliquis 2.5 mg bid tomorrow.    Cardiac Studies   10/02/2021 CORONARY STENT INTERVENTION    Conclusion       Origin lesion before RPDA  is 95% stenosed.   A drug-eluting stent was successfully placed using a SYNERGY XD  3.50X16.   Post intervention, there is a 0% residual stenosis.   Successful PCI of SVG to RCA with DES x 1   Plan: DAPT with ASA 81 mg for one month and Plavix 12 months. If no bleeding issues plan to resume Eliquis 2.5 mg bid tomorrow.      Cardiac catheterization 09/30/2021  Severe multivessel CAD with 95% distal left main stenosis, 95 to 99% proximal LAD stenosis; 95-99% proximal circumflex stenosis before a diminutive OM1 vessel with 99% stenosis; and total occlusion of the proximal RCA after a proximal branch.   Patent LIMA to mid LAD.   Old occlusion of the SVG which had supplied the diagonal vessel.   Old occlusion of the SVG which had supplied the circumflex marginal vessel.   New 95% near ostial/proximal SVG graft stenosis in the sequential graft supplying the PDA and PLA vessel.   LV EDP 6 mmHg.   RECOMMENDATION: Patient recently presented with increasing chest pain.  Troponins have increased to 2635 most likely due to the new subtotal near ostial/proximal graft stenosis in the sequential graft supplying the PDA and PLA vessels.  The patient will be hydrated post procedure with his stage IV CKD.  We will have colleagues review and most likely will require attempt at staged PCI to the SVG supplying the PDA and PLA system of the RCA.     Diagnostic Dominance: Right        Echocardiogram 09/29/2021    1. Left ventricular ejection fraction, by estimation, is 30 to 35%. The  left ventricle has moderately decreased function. The left ventricle  demonstrates regional wall motion abnormalities (see scoring  diagram/findings for description). The left  ventricular internal cavity  size was mildly dilated. There is mild  concentric left ventricular hypertrophy. Indeterminate diastolic filling  due to E-A fusion.   2. Right ventricular systolic function is normal. The right ventricular  size is normal.   3. Left atrial size was severely dilated.   4. The mitral valve is  abnormal. Moderate to severe mitral valve  regurgitation. No evidence of mitral stenosis.   5. The aortic valve is tricuspid. There is mild calcification of the  aortic valve. There is mild thickening of the aortic valve. Aortic valve  regurgitation is mild. Aortic valve sclerosis is present, with no evidence  of aortic valve stenosis.   Comparison(s): Changes from prior study are noted. The left ventricular  function is worsened   Patient Profile     88 y.o. male with a hx of CAD s/p CABG x 5 2002, aortic valve insufficiency and borderline aortic stenosis, paroxysmal atrial fibrillation/flutter on chronic anticoagulation, severe TR, chronic combined CHF, chronic venous insufficiency, mitral valve regurgitation, CKD IV, DM, HLD and hypertension presented with 3 weeks history of intermittent lower abdomen and epigastric/lower sternal pain (squeezing pressure) consistent with unstable angina/non-STEMI (peak troponin 2635), cardiac catheterization shows known chronic occlusion of SVG-OM and SVG-diagonal, with new 95% stenosis of the SVG-PDA-PLV and patent LIMA to LAD, echo shows interval marked decrease in LVEF to 30-35% with new inferior lateral wall motion abnormalities, s/p PCI-DES 10/02/2021.  Developed atrial fibrillation rapid ventricular sponsor on 10/03/2021.    Assessment & Plan    CHF: Good urine output.  Have resumed his diuretics.  We did hydrate him before his 2 catheterization procedures, so he may now be slightly hypervolemic.    Not on RAAS inhibitors due to advanced CKD and I do not think he should receive SGLT2 inhibitors for the same reason.  Plan to change metoprolol tartrate to metoprolol succinate at discharge. MR: Ischemic mechanism due to dysfunction of inferior/inferolateral wall. CAD/NSTEMI: Successful PCI 39/76, but complicated by small groin hematoma.  Slight drop in hemoglobin, roughly 2 g/deciliter from baseline.  Denies angina.  Hopefully will see recovery of inferior  wall function over time with reduction in mitral insufficiency and improvement in CHF.  On aspirin and clopidogrel, plan to discharge on Eliquis as well, stopping aspirin after 30 days. CKD 4: Creatinine at baseline despite 2 contrast based procedures this week.  Back on diuretics.  Avoid other nephrotoxic agents. HLP: Maximum dose rosuvastatin.  LDL less than 70.  Fenofibrate discontinued due to increased risk of toxicity with renal insufficiency. DM: Fair glycemic control with hemoglobin A1c 7.3% Parox Afib: Increase metoprolol dose.  Ideally we will consolidate to a single dose of metoprolol succinate at the time of discharge.  Resume Eliquis at discharge if no evidence of bleeding.       For questions or updates, please contact [place] Please consult [email or web address] for contact info under        Signed, Sanda Klein, MD  10/04/2021, 9:14 AM

## 2021-10-04 NOTE — Progress Notes (Signed)
PROGRESS NOTE    Nathan [name]  QVZ:[id number] DOB: Jul 10, 1933 DOA: 09/28/2021 PCP: Donnajean Lopes, MD    Brief Narrative:  88-year gentleman with history of chronic congestive heart failure, paroxysmal A-fib on Eliquis, aortic insufficiency, coronary artery disease status post CABG, hypertension hyperlipidemia type 2 diabetes and CKD stage IV presented to the emergency room with intermittent abdominal pain for about 3 weeks.  Patient has chronic indwelling Foley catheter that was changed 5 days ago.  In the emergency room hemodynamically stable.  BNP 1791.  High-sensitivity troponins 2318-2635.  Patient admitted with NSTEMI and cardiology consultation. 8/16, underwent cardiac catheterization and found to have extensive coronary artery disease.  For a staged PCI.   Staged PCI 8/18 Right groin hematoma, transferred to ICU overnight and stabilized now. 8/20, went into rapid A-fib.  Patient with controlled symptoms.   Assessment & Plan:   Non-ST elevation MI in a patient with known coronary artery disease: Currently chest pain-free.  Remains on heparin infusion.  On metoprolol 25 mg twice daily.  Crestor 40 mg daily.   Cardiac cath with high-grade stenosis in the functional SVG to PDA and PLB, decreased left ventricular ejection fraction. Started on Plavix, underwent staged PCI to SVG. Cardiology following.  Currently chest pain-free.  Postprocedure hematoma stabilized.  Conservative measures.  Eliquis on hold.  Acute on chronic systolic congestive heart failure: Symptomatic on arrival.  Elevated BNP.  Echocardiogram with known ejection fraction 30 to 35%.  Moderate to severe mitral regurgitation. Treated with IV diuresis, euvolemic.  Unable to tolerate GDMT.  Lasix started.  CKD stage IV: Creatinine at about baseline. 2.3-2.7 in his outpatient reports.  Creatinine 2.3.  Hyperlipidemia: On Crestor.  Type 2 diabetes: A1c 7.3.  Fairly controlled.  On insulin.  Electrolytes: Magnesium  and potassium replaced.  Adequate.  BPH with urinary retention/chronic indwelling Foley catheter: Stable.  Continue Proscar.  Rapid atrial fibrillation:  Developed rapid A-fib after procedure.  On increasing dose of metoprolol today.   Eliquis on hold today due to significant hematoma.  Probably may go back on Eliquis tomorrow.  E. coli bacteriuria: Recent catheter exchange.  No evidence of systemic infection.  E. coli more than 100,000 colonies.  Will not treat with antibiotics.  Continue mobilize.  Transfer out of ICU.  Anticipate home tomorrow if heart rate is controlled.  Lives with daughter.   DVT prophylaxis: SCD's Start: 09/30/21 1422     Code Status: full Code Family Communication: None. Disposition Plan: Status is: Inpatient Remains inpatient appropriate because: Rapid A-fib     Consultants:  Cardiology  Procedures:  Cath, plan  Antimicrobials:  None   Subjective: Patient seen and examined.  Denies any complaints.  Some soreness in the right groin.  Denies any chest pain or palpitations. Telemetry with rapid A-fib heart rate more than 140.  Objective: Vitals:   10/04/21 0500 10/04/21 0600 10/04/21 0802 10/04/21 0820  BP: 135/66 121/78  (!) 144/61  Pulse: 69 69  (!) 146  Resp: 17 20    Temp:   98.8 F (37.1 C)   TempSrc:   Oral   SpO2: 94% 94%    Weight: 72.1 kg     Height:        Intake/Output Summary (Last 24 hours) at 10/04/2021 1025 Last data filed at 10/04/2021 0630 Gross per 24 hour  Intake 902.13 ml  Output 1765 ml  Net -862.87 ml    Filed Weights   10/02/21 0521 10/03/21 0500 10/04/21 0500  Weight: 71.8  kg 70.6 kg 72.1 kg    Examination:  General exam: Calm and comfortable.  On room air.  Anxious. Respiratory system: No added sounds. Cardiovascular system: S1 & S2 heard, irregularly irregular.  Tachycardic. Gastrointestinal system: Soft.  Nontender.  Bowel sound present.   Right groin with ecchymosis, mild swelling.  Distal  vasculature intact. Foley catheter with clear urine.   Central nervous system: Alert and oriented. No focal neurological deficits. Extremities: Symmetric 5 x 5 power.     Data Reviewed: I have personally reviewed following labs and imaging studies  CBC: Recent Labs  Lab 09/30/21 0316 10/01/21 0044 10/02/21 0055 10/02/21 1941 10/03/21 0606 10/04/21 0614  WBC 10.7* 8.8 9.0  --  9.7 9.0  HGB 14.1 12.2* 12.6* 13.0 11.5* 10.4*  HCT 40.1 35.3* 37.6* 38.7* 34.4* 30.4*  MCV 88.3 90.3 92.6  --  93.5 91.0  PLT 243 212 216  --  218 956    Basic Metabolic Panel: Recent Labs  Lab 09/29/21 0750 09/30/21 0316 10/01/21 0044 10/02/21 0055 10/03/21 0606 10/04/21 0614  NA 141 135 134* 137 138 137  K 3.9 3.6 4.2 4.3 4.2 3.9  CL 110 105 108 110 113* 111  CO2 23 23 20* 20* 15* 18*  GLUCOSE 233* 177* 247* 119* 114* 134*  BUN 29* 37* 37* 37* 30* 39*  CREATININE 2.10* 2.29* 2.45* 2.38* 2.05* 2.30*  CALCIUM 9.6 9.3 8.3* 8.5* 8.5* 8.4*  MG 1.6*  --   --   --   --   --     GFR: Estimated Creatinine Clearance: 23.9 mL/min (A) (by C-G formula based on SCr of 2.3 mg/dL (H)). Liver Function Tests: Recent Labs  Lab 09/28/21 1627  AST 51*  ALT 28  ALKPHOS 42  BILITOT 0.9  PROT 6.8  ALBUMIN 3.2*    Recent Labs  Lab 09/28/21 1627  LIPASE 40    No results for input(s): "AMMONIA" in the last 168 hours. Coagulation Profile: No results for input(s): "INR", "PROTIME" in the last 168 hours. Cardiac Enzymes: No results for input(s): "CKTOTAL", "CKMB", "CKMBINDEX", "TROPONINI" in the last 168 hours. BNP (last 3 results) No results for input(s): "PROBNP" in the last 8760 hours. HbA1C: No results for input(s): "HGBA1C" in the last 72 hours.  CBG: Recent Labs  Lab 10/03/21 0623 10/03/21 1138 10/03/21 1643 10/03/21 2103 10/04/21 0635  GLUCAP 127* 158* 245* 227* 132*    Lipid Profile: No results for input(s): "CHOL", "HDL", "LDLCALC", "TRIG", "CHOLHDL", "LDLDIRECT" in the last  72 hours.  Thyroid Function Tests: No results for input(s): "TSH", "T4TOTAL", "FREET4", "T3FREE", "THYROIDAB" in the last 72 hours. Anemia Panel: No results for input(s): "VITAMINB12", "FOLATE", "FERRITIN", "TIBC", "IRON", "RETICCTPCT" in the last 72 hours. Sepsis Labs: No results for input(s): "PROCALCITON", "LATICACIDVEN" in the last 168 hours.  Recent Results (from the past 240 hour(s))  Urine Culture     Status: Abnormal   Collection Time: 09/28/21 10:48 PM   Specimen: Urine, Clean Catch  Result Value Ref Range Status   Specimen Description   Final    URINE, CLEAN CATCH Performed at [facility], [address] [place], [place] [address]    Special Requests   Final    NONE Performed at [facility], [address] [place], [place] [address]    Culture >=100,000 COLONIES/mL ESCHERICHIA COLI (A)  Final   Report Status 10/01/2021 FINAL  Final   Organism ID, Bacteria ESCHERICHIA COLI (A)  Final  Susceptibility   Escherichia coli - MIC*    AMPICILLIN >=32 RESISTANT Resistant     CEFAZOLIN <=4 SENSITIVE Sensitive     CEFEPIME <=0.12 SENSITIVE Sensitive     CEFTRIAXONE <=0.25 SENSITIVE Sensitive     CIPROFLOXACIN <=0.25 SENSITIVE Sensitive     GENTAMICIN <=1 SENSITIVE Sensitive     IMIPENEM <=0.25 SENSITIVE Sensitive     NITROFURANTOIN <=16 SENSITIVE Sensitive     TRIMETH/SULFA <=20 SENSITIVE Sensitive     AMPICILLIN/SULBACTAM 16 INTERMEDIATE Intermediate     PIP/TAZO <=4 SENSITIVE Sensitive     * >=100,000 COLONIES/mL ESCHERICHIA COLI  MRSA Next Gen by PCR, Nasal     Status: Abnormal   Collection Time: 09/29/21  5:05 AM   Specimen: Nasal Mucosa; Nasal Swab  Result Value Ref Range Status   MRSA by PCR Next Gen DETECTED (A) NOT DETECTED Final    Comment: CRITICAL RESULT CALLED TO, READ BACK BY AND VERIFIED WITH: C/ ATommie Raymond, RN 09/29/21 0625 A. LAFRANCE (NOTE) The GeneXpert MRSA Assay (FDA approved for NASAL specimens only), is one component  of a comprehensive MRSA colonization surveillance program. It is not intended to diagnose MRSA infection nor to guide or monitor treatment for MRSA infections. Test performance is not FDA approved in patients less than 98 years old. Performed at [facility], [place] [address] [place], [place] [address]          Radiology Studies: CT ABDOMEN PELVIS WO CONTRAST  Result Date: 10/02/2021 CLINICAL DATA:  Evaluate for retroperitoneal bleed. EXAM: CT ABDOMEN AND PELVIS WITHOUT CONTRAST TECHNIQUE: Multidetector CT imaging of the abdomen and pelvis was performed following the standard protocol without IV contrast. RADIATION DOSE REDUCTION: This exam was performed according to the departmental dose-optimization program which includes automated exposure control, adjustment of the mA and/or kV according to patient size and/or use of iterative reconstruction technique. COMPARISON:  CT abdomen pelvis dated 09/28/2021. FINDINGS: Evaluation of this exam is limited in the absence of intravenous contrast. Lower chest: The visualized lung bases are clear. There is coronary vascular calcification. No intra-abdominal free air or free fluid. Hepatobiliary: No focal liver abnormality is seen. No gallstones, gallbladder wall thickening, or biliary dilatation. Pancreas: Unremarkable. No pancreatic ductal dilatation or surrounding inflammatory changes. Spleen: Normal in size without focal abnormality. Adrenals/Urinary Tract: The adrenal glands unremarkable. Mild bilateral renal parenchyma atrophy. There is no hydronephrosis on either side. Residual contrast noted in the renal collecting systems bilaterally. The visualized ureters appear unremarkable. The gallbladder is minimally distended and contains excreted contrast from recent study. A Foley catheter is noted in the bladder. There is trabeculated appearance of the bladder wall consistent with chronic bladder outlet obstruction. Stomach/Bowel: Small hiatal hernia.  There is sigmoid diverticulosis and several small scattered colonic diverticula without active inflammatory changes. Distal duodenal diverticulum also noted. There is no bowel obstruction or active inflammation. The appendix is normal. Vascular/Lymphatic: Moderate aortoiliac atherosclerotic disease. The IVC is unremarkable. No portal venous gas. There is no adenopathy. Reproductive: Enlarged prostate gland measuring 6 cm in transverse axial diameter. Other: There is stranding of the subcutaneous soft tissues of the right groin. No large fluid collection or hematoma. A compression device noted over the right groin. Musculoskeletal: Bilateral L5 pars defects with grade 1 L5-S1 anterolisthesis. Degenerative changes of the spine. No acute osseous pathology. IMPRESSION: 1. No acute intra-abdominal or pelvic pathology. 2. Stranding of the subcutaneous soft tissues of the right groin. No large fluid collection or hematoma. 3. Colonic diverticulosis. No bowel  obstruction. Normal appendix. 4. Enlarged prostate gland with findings of chronic bladder outlet obstruction. 5. Aortic Atherosclerosis (ICD10-I70.0). Electronically Signed   By: Anner Crete M.D.   On: 10/02/2021 22:16   CARDIAC CATHETERIZATION  Result Date: 10/02/2021   Origin lesion before RPDA  is 95% stenosed.   A drug-eluting stent was successfully placed using a SYNERGY XD [id number].   Post intervention, there is a 0% residual stenosis. Successful PCI of SVG to RCA with DES x 1 Plan: DAPT with ASA 81 mg for one month and Plavix 12 months. If no bleeding issues plan to resume Eliquis 2.5 mg bid tomorrow.        Scheduled Meds:  aspirin  81 mg Oral Daily   Chlorhexidine Gluconate Cloth  6 each Topical Daily   clopidogrel  75 mg Oral Daily   finasteride  5 mg Oral Daily   furosemide  80 mg Oral Daily   insulin aspart  0-6 Units Subcutaneous TID WC   insulin aspart protamine- aspart  10 Units Subcutaneous BID WC   magnesium oxide  400 mg Oral  Daily   megestrol  20 mg Oral Daily   metoprolol tartrate  25 mg Oral Once   metoprolol tartrate  50 mg Oral BID   pantoprazole  40 mg Oral Daily   rosuvastatin  40 mg Oral Daily   sodium bicarbonate  650 mg Oral BID   sodium chloride flush  3 mL Intravenous Q12H   sodium chloride flush  3 mL Intravenous Q12H   sodium chloride flush  3 mL Intravenous Q12H   sodium chloride flush  3 mL Intravenous Q12H   sodium chloride flush  3 mL Intravenous Q12H   Continuous Infusions:  sodium chloride     sodium chloride       LOS: 5 days    Time spent: 35 minutes    Barb Merino, MD [facility] Pager [phone]

## 2021-10-05 ENCOUNTER — Encounter (HOSPITAL_COMMUNITY): Payer: Self-pay | Admitting: Cardiology

## 2021-10-05 DIAGNOSIS — I5020 Unspecified systolic (congestive) heart failure: Secondary | ICD-10-CM | POA: Diagnosis not present

## 2021-10-05 LAB — CBC
HCT: 34 % — ABNORMAL LOW (ref 39.0–52.0)
Hemoglobin: 11.6 g/dL — ABNORMAL LOW (ref 13.0–17.0)
MCH: 30.7 pg (ref 26.0–34.0)
MCHC: 34.1 g/dL (ref 30.0–36.0)
MCV: 89.9 fL (ref 80.0–100.0)
Platelets: 232 10*3/uL (ref 150–400)
RBC: 3.78 MIL/uL — ABNORMAL LOW (ref 4.22–5.81)
RDW: 13.3 % (ref 11.5–15.5)
WBC: 11.9 10*3/uL — ABNORMAL HIGH (ref 4.0–10.5)
nRBC: 0 % (ref 0.0–0.2)

## 2021-10-05 LAB — BASIC METABOLIC PANEL
Anion gap: 10 (ref 5–15)
BUN: 43 mg/dL — ABNORMAL HIGH (ref 8–23)
CO2: 20 mmol/L — ABNORMAL LOW (ref 22–32)
Calcium: 9.1 mg/dL (ref 8.9–10.3)
Chloride: 108 mmol/L (ref 98–111)
Creatinine, Ser: 2.33 mg/dL — ABNORMAL HIGH (ref 0.61–1.24)
GFR, Estimated: 27 mL/min — ABNORMAL LOW (ref >60)
Glucose, Bld: 129 mg/dL — ABNORMAL HIGH (ref 70–99)
Potassium: 3.4 mmol/L — ABNORMAL LOW (ref 3.5–5.1)
Sodium: 138 mmol/L (ref 135–145)

## 2021-10-05 LAB — GLUCOSE, CAPILLARY
Glucose-Capillary: 89 mg/dL (ref 70–99)
Glucose-Capillary: 137 mg/dL — ABNORMAL HIGH (ref 70–99)
Glucose-Capillary: 133 mg/dL — ABNORMAL HIGH (ref 70–99)

## 2021-10-05 MED ORDER — POTASSIUM CHLORIDE CRYS ER 20 MEQ PO TBCR
40.0000 meq | EXTENDED_RELEASE_TABLET | Freq: Two times a day (BID) | ORAL | Status: DC
  Administered 2021-10-05: 40 meq via ORAL
  Filled 2021-10-05: qty 2

## 2021-10-05 MED ORDER — CLOPIDOGREL BISULFATE 75 MG PO TABS: 75.0000 mg | ORAL_TABLET | Freq: Every day | ORAL | 2 refills | Status: AC

## 2021-10-05 MED ORDER — FUROSEMIDE 80 MG PO TABS
80.0000 mg | ORAL_TABLET | Freq: Every day | ORAL | 0 refills | Status: DC | PRN
Start: 2021-10-05 — End: 2022-03-25

## 2021-10-05 MED ORDER — ROSUVASTATIN CALCIUM 40 MG PO TABS: 40.0000 mg | ORAL_TABLET | Freq: Every day | ORAL | 2 refills | Status: AC

## 2021-10-05 MED ORDER — METOPROLOL SUCCINATE ER 100 MG PO TB24: 100.0000 mg | ORAL_TABLET | Freq: Every day | ORAL | 11 refills | Status: DC

## 2021-10-05 MED ORDER — POTASSIUM CHLORIDE CRYS ER 20 MEQ PO TBCR: 40.0000 meq | EXTENDED_RELEASE_TABLET | Freq: Every day | ORAL | 0 refills | Status: DC | PRN

## 2021-10-05 MED ORDER — ASPIRIN 81 MG PO CHEW: 81.0000 mg | CHEWABLE_TABLET | Freq: Every day | ORAL | 0 refills | Status: AC

## 2021-10-05 MED FILL — Lidocaine HCl Local Preservative Free (PF) Inj 1%: INTRAMUSCULAR | Qty: 30 | Status: AC

## 2021-10-05 MED FILL — Nitroglycerin IV Soln 100 MCG/ML in D5W: INTRA_ARTERIAL | Qty: 10 | Status: AC

## 2021-10-05 NOTE — Progress Notes (Signed)
Pt has orders to be discharged. Discharge instructions given and pt has no additional questions at this time. Medication regimen reviewed and pt educated. Pt verbalized understanding and has no additional questions. Telemetry box removed. IV removed and site in good condition. Pt stable and waiting for transportation and will transport to discharge lounge by SWOT RN.

## 2021-10-05 NOTE — Discharge Summary (Signed)
Physician Discharge Summary  Nathan [name] WJX:[id number] DOB: [birth year] DOA: 09/28/2021  PCP: Donnajean Lopes, MD  Admit date: 09/28/2021 Discharge date: 10/05/2021  Admitted From: Home Disposition: Home  Recommendations for Outpatient Follow-up:  Follow up with PCP in 1-2 weeks Please obtain BMP/CBC in one week Cardiology will schedule follow-up  Home Health: N/A Equipment/Devices: N/A  Discharge Condition: Stable CODE STATUS: Full code Diet recommendation: Low-salt low-carb diet  Discharge summary: 88-year gentleman with history of chronic congestive heart failure, paroxysmal A-fib on Eliquis, aortic insufficiency, coronary artery disease status post CABG, hypertension hyperlipidemia type 2 diabetes and CKD stage IV presented to the emergency room with intermittent abdominal pain for about 3 weeks.  Patient has chronic indwelling Foley catheter that was changed 5 days ago.  In the emergency room hemodynamically stable.  BNP 1791.  High-sensitivity troponins 2318-2635.  Patient admitted with NSTEMI and cardiology consultation. 8/16, underwent cardiac catheterization and found to have extensive coronary artery disease.  For a staged PCI.   Staged PCI 8/18 Right groin hematoma, transferred to ICU overnight and stabilized now. 8/20, went into rapid A-fib.  Patient with controlled symptoms after increasing dose of metoprolol.      Non-ST elevation MI in a patient with known coronary artery disease: Symptoms improved after treatment with PCI.   Already on Eliquis, planning to continue.   Plan to continue Plavix.   Aspirin 81 mg daily for 30 days. On beta-blockers.  Nitroglycerin as needed.  Postprocedure hematoma stabilized.  Conservative measures.  Stabilized.  Eliquis to be started today.   Acute on chronic systolic congestive heart failure: Symptomatic on arrival.  Elevated BNP.  Echocardiogram with known ejection fraction 30 to 35%.  Moderate to severe mitral  regurgitation. Treated with intermittent Lasix.  Euvolemic.  Unable to tolerate GDMT.  Plan to discharge with Lasix 80 mg as needed daily for swelling, shortness of breath along with potassium 40 mill EQ   CKD stage IV: Creatinine at about baseline. 2.3-2.7 in his outpatient reports.  Creatinine 2.3.   Hyperlipidemia: On Crestor.   Type 2 diabetes: A1c 7.3.  Fairly controlled.  On insulin.   Electrolytes: Magnesium and potassium replaced.  Adequate.   BPH with urinary retention/chronic indwelling Foley catheter: Stable.  Continue Proscar.   Rapid atrial fibrillation:  Developed rapid A-fib after procedure.  Sinus rhythm and rate controlled after increasing dose of metoprolol. Was recommended to discharge on long-acting metoprolol.  Go back on Eliquis.   E. coli bacteriuria: Recent catheter exchange.  No evidence of systemic infection.  E. coli more than 100,000 colonies.  Not treated.   Adequately improved.  Going home with family.  Discharge Diagnoses:  Principal Problem:   HFrEF (heart failure with reduced ejection fraction) (HCC) Active Problems:   NSTEMI (non-ST elevated myocardial infarction) ([place])   Abdominal pain   Coronary artery disease   Essential hypertension   Diabetes mellitus type 2 with complications (HCC)   Hypomagnesemia   Abnormal urinalysis   Chronic indwelling Foley catheter   CKD (chronic kidney disease), stage IV [facility])    Discharge Instructions  Discharge Instructions     Amb Referral to Cardiac Rehabilitation   Complete by: As directed    [place]   Diagnosis:  NSTEMI Coronary Stents     After initial evaluation and assessments completed: Virtual Based Care may be provided alone or in conjunction with Phase 2 Cardiac Rehab based on patient barriers.: Yes   Call MD for:  difficulty breathing, headache or visual  disturbances   Complete by: As directed    Call MD for:  extreme fatigue   Complete by: As directed    Call MD for:  persistant  dizziness or light-headedness   Complete by: As directed    Diet - low sodium heart healthy   Complete by: As directed    Diet Carb Modified   Complete by: As directed    Increase activity slowly   Complete by: As directed       Allergies as of 10/05/2021       Reactions   Flomax [tamsulosin Hcl] Other (See Comments)   Dizzy   Lasix [furosemide]    Dizziness   Silodosin Other (See Comments)   dizziness        Medication List     STOP taking these medications    fenofibrate 145 MG tablet Commonly known as: TRICOR   metoprolol tartrate 25 MG tablet Commonly known as: LOPRESSOR   onetouch ultrasoft lancets       TAKE these medications    albuterol 108 (90 Base) MCG/ACT inhaler Commonly known as: VENTOLIN HFA Inhale 2 puffs into the lungs every 4 (four) hours as needed for wheezing or shortness of breath.   apixaban 2.5 MG Tabs tablet Commonly known as: ELIQUIS Take 2.5 mg by mouth 2 (two) times daily.   aspirin 81 MG chewable tablet Chew 1 tablet (81 mg total) by mouth daily. Start taking on: October 06, 2021   CENTRUM SILVER PO Take 1 tablet by mouth daily.   cholecalciferol 25 MCG (1000 UNIT) tablet Commonly known as: VITAMIN D3 Take 1,000 Units by mouth daily.   clopidogrel 75 MG tablet Commonly known as: PLAVIX Take 1 tablet (75 mg total) by mouth daily. Start taking on: October 06, 2021   finasteride 5 MG tablet Commonly known as: PROSCAR Take 5 mg by mouth daily.   furosemide 80 MG tablet Commonly known as: LASIX Take 1 tablet (80 mg total) by mouth daily as needed for fluid or edema (if you notice shortness of breath , leg swelling).   insulin NPH-regular Human (70-30) 100 UNIT/ML injection Inject 10-20 Units into the skin See admin instructions. 10 units in the morning & 20 units at dinner Sliding Scale if BS>150   magnesium oxide 400 MG tablet Commonly known as: MAG-OX Take 400 mg by mouth daily.   megestrol 20 MG tablet Commonly  known as: MEGACE Take 20 mg by mouth daily.   metoprolol succinate 100 MG 24 hr tablet Commonly known as: Toprol XL Take 1 tablet (100 mg total) by mouth daily. Take with or immediately following a meal.   nitroGLYCERIN 0.4 MG SL tablet Commonly known as: NITROSTAT Place 1 tablet (0.4 mg total) under the tongue every 5 (five) minutes as needed for chest pain.   omeprazole 20 MG capsule Commonly known as: PRILOSEC Take 20 mg by mouth 2 (two) times daily.   OneTouch Verio test strip Generic drug: glucose blood   OneTouch Verio w/Device Kit   potassium chloride SA 20 MEQ tablet Commonly known as: KLOR-CON M Take 2 tablets (40 mEq total) by mouth daily as needed (take on the day you need to take Furosemide).   RELION INSULIN SYRINGE 1ML/31G 31G X 5/16" 1 ML Misc Generic drug: Insulin Syringe-Needle U-100 USE 1 TWICE DAILY AS DIRECTED   rosuvastatin 40 MG tablet Commonly known as: CRESTOR Take 1 tablet (40 mg total) by mouth daily. Start taking on: October 06, 2021   sodium  bicarbonate 650 MG tablet Take 650 mg by mouth 2 (two) times daily.        Follow-up Information     [facility]. Go in 11 day(s).   Specialty: Cardiology Why: Hospital follow up PLEASE bring a current medication list to appointment FREE valet parking, Entrance C, off [employer] information: [address] [phone] [place]        Donnajean Lopes, MD Follow up.   Specialty: Internal Medicine Why: The office will call patient. Contact information: [place] [address] [phone]         Nahser, Wonda Cheng, MD .   Specialty: Cardiology Contact information: [address] [place] [address] [phone]                Allergies  Allergen Reactions   Flomax [Tamsulosin Hcl] Other (See Comments)    Dizzy   Lasix [Furosemide]      Dizziness   Silodosin Other (See Comments)    dizziness    Consultations: Cardiology   Procedures/Studies: CT ABDOMEN PELVIS WO CONTRAST  Result Date: 10/02/2021 CLINICAL DATA:  Evaluate for retroperitoneal bleed. EXAM: CT ABDOMEN AND PELVIS WITHOUT CONTRAST TECHNIQUE: Multidetector CT imaging of the abdomen and pelvis was performed following the standard protocol without IV contrast. RADIATION DOSE REDUCTION: This exam was performed according to the departmental dose-optimization program which includes automated exposure control, adjustment of the mA and/or kV according to patient size and/or use of iterative reconstruction technique. COMPARISON:  CT abdomen pelvis dated 09/28/2021. FINDINGS: Evaluation of this exam is limited in the absence of intravenous contrast. Lower chest: The visualized lung bases are clear. There is coronary vascular calcification. No intra-abdominal free air or free fluid. Hepatobiliary: No focal liver abnormality is seen. No gallstones, gallbladder wall thickening, or biliary dilatation. Pancreas: Unremarkable. No pancreatic ductal dilatation or surrounding inflammatory changes. Spleen: Normal in size without focal abnormality. Adrenals/Urinary Tract: The adrenal glands unremarkable. Mild bilateral renal parenchyma atrophy. There is no hydronephrosis on either side. Residual contrast noted in the renal collecting systems bilaterally. The visualized ureters appear unremarkable. The gallbladder is minimally distended and contains excreted contrast from recent study. A Foley catheter is noted in the bladder. There is trabeculated appearance of the bladder wall consistent with chronic bladder outlet obstruction. Stomach/Bowel: Small hiatal hernia. There is sigmoid diverticulosis and several small scattered colonic diverticula without active inflammatory changes. Distal duodenal diverticulum also noted. There is no bowel obstruction or active inflammation. The appendix is normal.  Vascular/Lymphatic: Moderate aortoiliac atherosclerotic disease. The IVC is unremarkable. No portal venous gas. There is no adenopathy. Reproductive: Enlarged prostate gland measuring 6 cm in transverse axial diameter. Other: There is stranding of the subcutaneous soft tissues of the right groin. No large fluid collection or hematoma. A compression device noted over the right groin. Musculoskeletal: Bilateral L5 pars defects with grade 1 L5-S1 anterolisthesis. Degenerative changes of the spine. No acute osseous pathology. IMPRESSION: 1. No acute intra-abdominal or pelvic pathology. 2. Stranding of the subcutaneous soft tissues of the right groin. No large fluid collection or hematoma. 3. Colonic diverticulosis. No bowel obstruction. Normal appendix. 4. Enlarged prostate gland with findings of chronic bladder outlet obstruction. 5. Aortic Atherosclerosis (ICD10-I70.0). Electronically Signed   By: Anner Crete M.D.   On: 10/02/2021 22:16   CARDIAC CATHETERIZATION  Result Date: 10/02/2021   Origin lesion before RPDA  is 95% stenosed.  A drug-eluting stent was successfully placed using a SYNERGY XD [id number].   Post intervention, there is a 0% residual stenosis. Successful PCI of SVG to RCA with DES x 1 Plan: DAPT with ASA 81 mg for one month and Plavix 12 months. If no bleeding issues plan to resume Eliquis 2.5 mg bid tomorrow.   CARDIAC CATHETERIZATION  Result Date: 09/30/2021   Mid LM to Dist LM lesion is 95% stenosed.   Prox LAD lesion is 95% stenosed.   Ost Cx to Prox Cx lesion is 95% stenosed.   Prox LAD to Mid LAD lesion is 99% stenosed.   1st Mrg lesion is 99% stenosed.   Prox RCA to Mid RCA lesion is 100% stenosed.   Origin to Prox Graft lesion is 100% stenosed.   Origin lesion is 100% stenosed.   Origin lesion before RPDA  is 95% stenosed.   Mid LAD lesion is 70% stenosed.   Mid LAD to Dist LAD lesion is 70% stenosed. Severe multivessel CAD with 95% distal left main stenosis, 95 to 99% proximal LAD  stenosis; 95-99% proximal circumflex stenosis before a diminutive OM1 vessel with 99% stenosis; and total occlusion of the proximal RCA after a proximal branch. Patent LIMA to mid LAD. Old occlusion of the SVG which had supplied the diagonal vessel. Old occlusion of the SVG which had supplied the circumflex marginal vessel. New 95% near ostial/proximal SVG graft stenosis in the sequential graft supplying the PDA and PLA vessel. LV EDP 6 mmHg. RECOMMENDATION: Patient recently presented with increasing chest pain.  Troponins have increased to 2635 most likely due to the new subtotal near ostial/proximal graft stenosis in the sequential graft supplying the PDA and PLA vessels.  The patient will be hydrated post procedure with his stage IV CKD.  We will have colleagues review and most likely will require attempt at staged PCI to the SVG supplying the PDA and PLA system of the RCA.    ECHOCARDIOGRAM COMPLETE  Result Date: 09/29/2021    ECHOCARDIOGRAM REPORT   Patient Name:   Nathan [name] Date of Exam: 09/29/2021 Medical Rec #:  [id number]    Height:       71.0 in Accession #:    [id number]   Weight:       156.3 lb Date of Birth:  01/31/1934    BSA:          1.899 m Patient Age:    75 years     BP:           153/80 mmHg Patient Gender: M            HR:           78 bpm. Exam Location:  Inpatient Procedure: 2D Echo, Cardiac Doppler, Color Doppler and Intracardiac            Opacification Agent Indications:    CHF-acute systolic  History:        Patient has prior history of Echocardiogram examinations, most                 recent 04/26/2018. CHF, CAD, Prior CABG, Aortic Valve Disease and                 Mitral Valve Disease, Arrythmias:Atrial Fibrillation; Risk                 Factors:Diabetes and Dyslipidemia. Elevated troponin. CKD.  Sonographer:    Clayton Lefort RDCS (AE) Referring Phys: [id number] [place]  Comments: No subcostal window. IMPRESSIONS  1. Left ventricular ejection fraction, by estimation, is  30 to 35%. The left ventricle has moderately decreased function. The left ventricle demonstrates regional wall motion abnormalities (see scoring diagram/findings for description). The left ventricular internal cavity size was mildly dilated. There is mild concentric left ventricular hypertrophy. Indeterminate diastolic filling due to E-A fusion.  2. Right ventricular systolic function is normal. The right ventricular size is normal.  3. Left atrial size was severely dilated.  4. The mitral valve is abnormal. Moderate to severe mitral valve regurgitation. No evidence of mitral stenosis.  5. The aortic valve is tricuspid. There is mild calcification of the aortic valve. There is mild thickening of the aortic valve. Aortic valve regurgitation is mild. Aortic valve sclerosis is present, with no evidence of aortic valve stenosis. Comparison(s): Changes from prior study are noted. The left ventricular function is worsened. FINDINGS  Left Ventricle: Left ventricular ejection fraction, by estimation, is 30 to 35%. The left ventricle has moderately decreased function. The left ventricle demonstrates regional wall motion abnormalities. Definity contrast agent was given IV to delineate the left ventricular endocardial borders. The left ventricular internal cavity size was mildly dilated. There is mild concentric left ventricular hypertrophy. Indeterminate diastolic filling due to E-A fusion.  LV Wall Scoring: The entire lateral wall is akinetic. Right Ventricle: The right ventricular size is normal. No increase in right ventricular wall thickness. Right ventricular systolic function is normal. Left Atrium: Left atrial size was severely dilated. Right Atrium: Right atrial size was normal in size. Pericardium: There is no evidence of pericardial effusion. Mitral Valve: The mitral valve is abnormal. Moderate to severe mitral valve regurgitation. No evidence of mitral valve stenosis. Tricuspid Valve: The tricuspid valve is grossly  normal. Tricuspid valve regurgitation is mild . No evidence of tricuspid stenosis. Aortic Valve: The aortic valve is tricuspid. There is mild calcification of the aortic valve. There is mild thickening of the aortic valve. Aortic valve regurgitation is mild. Aortic regurgitation PHT measures 333 msec. Aortic valve sclerosis is present,  with no evidence of aortic valve stenosis. Aortic valve mean gradient measures 2.0 mmHg. Aortic valve peak gradient measures 3.2 mmHg. Aortic valve area, by VTI measures 2.79 cm. Pulmonic Valve: The pulmonic valve was grossly normal. Pulmonic valve regurgitation is not visualized. No evidence of pulmonic stenosis. Aorta: The aortic root and ascending aorta are structurally normal, with no evidence of dilitation. Venous: The inferior vena cava was not well visualized. IAS/Shunts: The atrial septum is grossly normal.  LEFT VENTRICLE PLAX 2D LVIDd:         5.00 cm LVIDs:         4.50 cm LV PW:         1.10 cm LV IVS:        1.20 cm LVOT diam:     2.10 cm LV SV:         50 LV SV Index:   26 LVOT Area:     3.46 cm  LV Volumes (MOD) LV vol d, MOD A4C: 168.0 ml LV vol s, MOD A4C: 131.0 ml LV SV MOD A4C:     168.0 ml RIGHT VENTRICLE RV S prime:     7.43 cm/s TAPSE (M-mode): 1.1 cm LEFT ATRIUM              Index        RIGHT ATRIUM           Index LA diam:  5.20 cm  2.74 cm/m   RA Area:     12.00 cm LA Vol (A2C):   94.7 ml  49.87 ml/m  RA Volume:   23.30 ml  12.27 ml/m LA Vol (A4C):   102.0 ml 53.72 ml/m LA Biplane Vol: 100.0 ml 52.66 ml/m  AORTIC VALVE AV Area (Vmax):    2.75 cm AV Area (Vmean):   2.66 cm AV Area (VTI):     2.79 cm AV Vmax:           89.80 cm/s AV Vmean:          63.100 cm/s AV VTI:            0.179 m AV Peak Grad:      3.2 mmHg AV Mean Grad:      2.0 mmHg LVOT Vmax:         71.30 cm/s LVOT Vmean:        48.500 cm/s LVOT VTI:          0.144 m LVOT/AV VTI ratio: 0.80 AI PHT:            333 msec  AORTA Ao Root diam: 3.90 cm Ao Asc diam:  4.00 cm TRICUSPID  VALVE TR Peak grad:   15.8 mmHg TR Vmax:        199.00 cm/s  SHUNTS Systemic VTI:  0.14 m Systemic Diam: 2.10 cm Eleonore Chiquito MD Electronically signed by Eleonore Chiquito MD Signature Date/Time: 09/29/2021/6:53:37 PM    Final    CT ABDOMEN PELVIS WO CONTRAST  Result Date: 09/28/2021 CLINICAL DATA:  Abdominal pain, acute, nonlocalized. 88 yo male. Endorses ABD Pain that is Mid ABD and began 2-3 Weeks ago. Worsened Today. Intermittent in [place]. Possible contstipation GFR=29 EXAM: CT ABDOMEN AND PELVIS WITHOUT CONTRAST TECHNIQUE: Multidetector CT imaging of the abdomen and pelvis was performed following the standard protocol without IV contrast. RADIATION DOSE REDUCTION: This exam was performed according to the departmental dose-optimization program which includes automated exposure control, adjustment of the mA and/or kV according to patient size and/or use of iterative reconstruction technique. COMPARISON:  None Available. FINDINGS: Lower chest: Mild bronchial wall thickening. No acute abnormality. Coronary artery calcifications. Hiatal hernia containing mesenteric fat. Hepatobiliary: No focal liver abnormality. No gallstones, gallbladder wall thickening, or pericholecystic fluid. No biliary dilatation. Pancreas: No focal lesion. Normal pancreatic contour. No surrounding inflammatory changes. No main pancreatic ductal dilatation. Spleen: Normal in size without focal abnormality. Adrenals/Urinary Tract: No adrenal nodule bilaterally. No nephrolithiasis and no hydronephrosis. Query left parapelvic cyst (5:35). No ureterolithiasis or hydroureter. Foley catheter tip and balloon terminate within the urinary bladder lumen. The urinary bladder is decompressed and grossly unremarkable. Nonspecific foci of gas associated with the urinary bladder lumen. Stomach/Bowel: Stomach is within normal limits. No evidence of bowel wall thickening or dilatation. Diffuse colonic diverticulosis. Appendix appears normal.  Vascular/Lymphatic: No abdominal aorta or iliac aneurysm. Severe atherosclerotic plaque of the aorta and its branches. No abdominal, pelvic, or inguinal lymphadenopathy. Reproductive: The prostate is enlarged measuring up to 5.5 cm. Other: No intraperitoneal free fluid. No intraperitoneal free gas. No organized fluid collection. Musculoskeletal: No abdominal wall hernia or abnormality. No suspicious lytic or blastic osseous lesions. No acute displaced fracture. Multilevel degenerative changes of the spine. Grade 2 anterolisthesis of L5 on S1 with associated bilateral L5 pars interarticularis defects. Mild retrolisthesis of L4 on L5. IMPRESSION: 1. Small hiatal hernia with associated mesenteric fat. 2. Colonic diverticulosis with no acute diverticulitis. 3. Grade 2 anterolisthesis of L5  on S1 with associated bilateral L5 pars interarticularis defects. 4. Prostatomegaly. 5.  Aortic Atherosclerosis (ICD10-I70.0). Electronically Signed   By: Iven Finn M.D.   On: 09/28/2021 21:19   DG Chest 2 View  Result Date: 09/28/2021 CLINICAL DATA:  Angina EXAM: CHEST - 2 VIEW COMPARISON:  Radiographs 05/07/2020 FINDINGS: No focal consolidation, pleural effusion, or pneumothorax. Borderline cardiomegaly. Sternotomy and CABG. Pulmonary vascular congestion. No acute osseous abnormality. IMPRESSION: Borderline cardiomegaly and pulmonary vascular congestion. Electronically Signed   By: Placido Sou M.D.   On: 09/28/2021 20:45   (Echo, Carotid, EGD, Colonoscopy, ERCP)    Subjective: Patient seen and examined.  No overnight events.  He himself denies any complaints.  Feels weak but denies any shortness of breath, chest pain or palpitations.  Telemetry with occasional tachycardia.  Sinus rhythm with some ectopies.  Eager to go home.   Discharge Exam: Vitals:   10/05/21 0434 10/05/21 0724  BP: 138/69 134/72  Pulse: 80 79  Resp: (!) 22 (!) 24  Temp: 98.1 F (36.7 C) 98.4 F (36.9 C)  SpO2: 99% 96%   Vitals:    10/04/21 1912 10/04/21 2348 10/05/21 0434 10/05/21 0724  BP: 133/60 127/66 138/69 134/72  Pulse: 76 66 80 79  Resp: 20 18 (!) 22 (!) 24  Temp: 98.1 F (36.7 C) 98.5 F (36.9 C) 98.1 F (36.7 C) 98.4 F (36.9 C)  TempSrc: Oral Oral Oral Oral  SpO2: 98% 98% 99% 96%  Weight:   70.3 kg   Height:        General: Pt is alert, awake, not in acute distress, on room air. Cardiovascular: RRR, S1/S2 +, no rubs, no gallops Respiratory: CTA bilaterally, no wheezing, no rhonchi Abdominal: Soft, NT, ND, bowel sounds +, right groin with ecchymosis.  No swelling or tenderness.  Distal vascular status intact. Extremities: no edema, no cyanosis    The results of significant diagnostics from this hospitalization (including imaging, microbiology, ancillary and laboratory) are listed below for reference.     Microbiology: Recent Results (from the past 240 hour(s))  Urine Culture     Status: Abnormal   Collection Time: 09/28/21 10:48 PM   Specimen: Urine, Clean Catch  Result Value Ref Range Status   Specimen Description   Final    URINE, CLEAN CATCH Performed at [facility], [address] [place], [place] [address]    Special Requests   Final    NONE Performed at [facility], [address] [place], [place] [address]    Culture >=100,000 COLONIES/mL ESCHERICHIA COLI (A)  Final   Report Status 10/01/2021 FINAL  Final   Organism ID, Bacteria ESCHERICHIA COLI (A)  Final      Susceptibility   Escherichia coli - MIC*    AMPICILLIN >=32 RESISTANT Resistant     CEFAZOLIN <=4 SENSITIVE Sensitive     CEFEPIME <=0.12 SENSITIVE Sensitive     CEFTRIAXONE <=0.25 SENSITIVE Sensitive     CIPROFLOXACIN <=0.25 SENSITIVE Sensitive     GENTAMICIN <=1 SENSITIVE Sensitive     IMIPENEM <=0.25 SENSITIVE Sensitive     NITROFURANTOIN <=16 SENSITIVE Sensitive     TRIMETH/SULFA <=20 SENSITIVE Sensitive     AMPICILLIN/SULBACTAM 16 INTERMEDIATE Intermediate     PIP/TAZO <=4 SENSITIVE  Sensitive     * >=100,000 COLONIES/mL ESCHERICHIA COLI  MRSA Next Gen by PCR, Nasal     Status: Abnormal   Collection Time: 09/29/21  5:05 AM   Specimen: Nasal Mucosa; Nasal  Swab  Result Value Ref Range Status   MRSA by PCR Next Gen DETECTED (A) NOT DETECTED Final    Comment: CRITICAL RESULT CALLED TO, READ BACK BY AND VERIFIED WITH: C/ ATommie Raymond, RN 09/29/21 0625 A. LAFRANCE (NOTE) The GeneXpert MRSA Assay (FDA approved for NASAL specimens only), is one component of a comprehensive MRSA colonization surveillance program. It is not intended to diagnose MRSA infection nor to guide or monitor treatment for MRSA infections. Test performance is not FDA approved in patients less than 16 years old. Performed at [facility], [place] [address], [place], [place] [address]      Labs: BNP (last 3 results) Recent Labs    09/28/21 1627  BNP 4,680.3*   Basic Metabolic Panel: Recent Labs  Lab 09/29/21 0750 09/30/21 0316 10/01/21 0044 10/02/21 0055 10/03/21 0606 10/04/21 0614 10/05/21 0742  NA 141   < > 134* 137 138 137 138  K 3.9   < > 4.2 4.3 4.2 3.9 3.4*  CL 110   < > 108 110 113* 111 108  CO2 23   < > 20* 20* 15* 18* 20*  GLUCOSE 233*   < > 247* 119* 114* 134* 129*  BUN 29*   < > 37* 37* 30* 39* 43*  CREATININE 2.10*   < > 2.45* 2.38* 2.05* 2.30* 2.33*  CALCIUM 9.6   < > 8.3* 8.5* 8.5* 8.4* 9.1  MG 1.6*  --   --   --   --   --   --    < > = values in this interval not displayed.   Liver Function Tests: Recent Labs  Lab 09/28/21 1627  AST 51*  ALT 28  ALKPHOS 42  BILITOT 0.9  PROT 6.8  ALBUMIN 3.2*   Recent Labs  Lab 09/28/21 1627  LIPASE 40   No results for input(s): "AMMONIA" in the last 168 hours. CBC: Recent Labs  Lab 10/01/21 0044 10/02/21 0055 10/02/21 1941 10/03/21 0606 10/04/21 0614 10/05/21 0742  WBC 8.8 9.0  --  9.7 9.0 11.9*  HGB 12.2* 12.6* 13.0 11.5* 10.4* 11.6*  HCT 35.3* 37.6* 38.7* 34.4* 30.4* 34.0*  MCV 90.3 92.6  --  93.5 91.0  89.9  PLT 212 216  --  218 174 232   Cardiac Enzymes: No results for input(s): "CKTOTAL", "CKMB", "CKMBINDEX", "TROPONINI" in the last 168 hours. BNP: Invalid input(s): "POCBNP" CBG: Recent Labs  Lab 10/04/21 0635 10/04/21 1115 10/04/21 1602 10/04/21 2111 10/05/21 0534  GLUCAP 132* 220* 215* 148* 137*   D-Dimer No results for input(s): "DDIMER" in the last 72 hours. Hgb A1c No results for input(s): "HGBA1C" in the last 72 hours. Lipid Profile No results for input(s): "CHOL", "HDL", "LDLCALC", "TRIG", "CHOLHDL", "LDLDIRECT" in the last 72 hours. Thyroid function studies No results for input(s): "TSH", "T4TOTAL", "T3FREE", "THYROIDAB" in the last 72 hours.  Invalid input(s): "FREET3" Anemia work up No results for input(s): "VITAMINB12", "FOLATE", "FERRITIN", "TIBC", "IRON", "RETICCTPCT" in the last 72 hours. Urinalysis    Component Value Date/Time   COLORURINE YELLOW 09/28/2021 2248   APPEARANCEUR CLOUDY (A) 09/28/2021 2248   LABSPEC 1.025 09/28/2021 2248   PHURINE 5.0 09/28/2021 2248   GLUCOSEU >=500 (A) 09/28/2021 2248   HGBUR MODERATE (A) 09/28/2021 2248   BILIRUBINUR NEGATIVE 09/28/2021 2248   KETONESUR NEGATIVE 09/28/2021 2248   PROTEINUR >=300 (A) 09/28/2021 2248   NITRITE NEGATIVE 09/28/2021 2248   LEUKOCYTESUR SMALL (A) 09/28/2021 2248   Sepsis Labs Recent Labs  Lab 10/02/21 0055 10/03/21 0606 10/04/21 0614 10/05/21 0742  WBC 9.0 9.7 9.0 11.9*   Microbiology Recent Results (from the past 240 hour(s))  Urine Culture     Status: Abnormal   Collection Time: 09/28/21 10:48 PM   Specimen: Urine, Clean Catch  Result Value Ref Range Status   Specimen Description   Final    URINE, CLEAN CATCH Performed at [facility], [address] [place], [place] [address]    Special Requests   Final    NONE Performed at [facility], [address] [place], [place] [address]    Culture >=100,000 COLONIES/mL ESCHERICHIA COLI (A)  Final    Report Status 10/01/2021 FINAL  Final   Organism ID, Bacteria ESCHERICHIA COLI (A)  Final      Susceptibility   Escherichia coli - MIC*    AMPICILLIN >=32 RESISTANT Resistant     CEFAZOLIN <=4 SENSITIVE Sensitive     CEFEPIME <=0.12 SENSITIVE Sensitive     CEFTRIAXONE <=0.25 SENSITIVE Sensitive     CIPROFLOXACIN <=0.25 SENSITIVE Sensitive     GENTAMICIN <=1 SENSITIVE Sensitive     IMIPENEM <=0.25 SENSITIVE Sensitive     NITROFURANTOIN <=16 SENSITIVE Sensitive     TRIMETH/SULFA <=20 SENSITIVE Sensitive     AMPICILLIN/SULBACTAM 16 INTERMEDIATE Intermediate     PIP/TAZO <=4 SENSITIVE Sensitive     * >=100,000 COLONIES/mL ESCHERICHIA COLI  MRSA Next Gen by PCR, Nasal     Status: Abnormal   Collection Time: 09/29/21  5:05 AM   Specimen: Nasal Mucosa; Nasal Swab  Result Value Ref Range Status   MRSA by PCR Next Gen DETECTED (A) NOT DETECTED Final    Comment: CRITICAL RESULT CALLED TO, READ BACK BY AND VERIFIED WITH: C/ ATommie Raymond, RN 09/29/21 0625 A. LAFRANCE (NOTE) The GeneXpert MRSA Assay (FDA approved for NASAL specimens only), is one component of a comprehensive MRSA colonization surveillance program. It is not intended to diagnose MRSA infection nor to guide or monitor treatment for MRSA infections. Test performance is not FDA approved in patients less than 33 years old. Performed at [facility], [place] [address], [place], [place] [address]      Time coordinating discharge: 35 minutes  SIGNED:   Barb Merino, MD  [facility] 10/05/2021, 11:18 AM

## 2021-10-05 NOTE — Plan of Care (Signed)
  Problem: Nutrition: Goal: Adequate nutrition will be maintained Outcome: Completed/Met   Problem: Coping: Goal: Level of anxiety will decrease Outcome: Completed/Met   Problem: Pain Managment: Goal: General experience of comfort will improve Outcome: Completed/Met   Problem: Safety: Goal: Ability to remain free from injury will improve Outcome: Completed/Met   Problem: Skin Integrity: Goal: Risk for impaired skin integrity will decrease Outcome: Completed/Met   Problem: Nutritional: Goal: Maintenance of adequate nutrition will improve Outcome: Completed/Met

## 2021-10-05 NOTE — Discharge Instructions (Signed)

## 2021-10-05 NOTE — Progress Notes (Signed)
Progress Note  Patient Name: Nathan [name] Date of Encounter: 10/05/2021  [facility] Cardiologist: [name], MD   Subjective   Mild discomfort at right groin hematoma site. Appears to be in SR with ectopy  Inpatient Medications    Scheduled Meds:  aspirin  81 mg Oral Daily   Chlorhexidine Gluconate Cloth  6 each Topical Daily   clopidogrel  75 mg Oral Daily   finasteride  5 mg Oral Daily   furosemide  80 mg Oral Daily   insulin aspart  0-6 Units Subcutaneous TID WC   insulin aspart protamine- aspart  10 Units Subcutaneous BID WC   magnesium oxide  400 mg Oral Daily   megestrol  20 mg Oral Daily   metoprolol tartrate  50 mg Oral BID   pantoprazole  40 mg Oral Daily   potassium chloride  40 mEq Oral BID   rosuvastatin  40 mg Oral Daily   sodium bicarbonate  650 mg Oral BID   sodium chloride flush  3 mL Intravenous Q12H   sodium chloride flush  3 mL Intravenous Q12H   sodium chloride flush  3 mL Intravenous Q12H   sodium chloride flush  3 mL Intravenous Q12H   sodium chloride flush  3 mL Intravenous Q12H   Continuous Infusions:  sodium chloride     sodium chloride     PRN Meds: sodium chloride, sodium chloride, acetaminophen, albuterol, ondansetron (ZOFRAN) IV, mouth rinse, sodium chloride flush, sodium chloride flush   Vital Signs    Vitals:   10/04/21 1912 10/04/21 2348 10/05/21 0434 10/05/21 0724  BP: 133/60 127/66 138/69 134/72  Pulse: 76 66 80 79  Resp: 20 18 (!) 22 (!) 24  Temp: 98.1 F (36.7 C) 98.5 F (36.9 C) 98.1 F (36.7 C) 98.4 F (36.9 C)  TempSrc: Oral Oral Oral Oral  SpO2: 98% 98% 99% 96%  Weight:   70.3 kg   Height:        Intake/Output Summary (Last 24 hours) at 10/05/2021 1027 Last data filed at 10/05/2021 0900 Gross per 24 hour  Intake 820 ml  Output 2700 ml  Net -1880 ml      10/05/2021    4:34 AM 10/04/2021    5:00 AM 10/03/2021    5:00 AM  Last 3 Weights  Weight (lbs) 154 lb 15.7 oz 158 lb 15.2 oz 155 lb 10.3 oz   Weight (kg) 70.3 kg 72.1 kg 70.6 kg      Telemetry    SR, first deg AVB, PAC, PVC- Personally Reviewed  ECG    No new tracing- Personally Reviewed  Physical Exam  Appears well GEN: No acute distress.   Neck: No JVD Cardiac: Regular rhythm, normal rate. 2/6 systolic murmur at sternal border Respiratory: Clear to auscultation bilaterally. GI: Soft, nontender, non-distended  MS: No edema; No deformity. Neuro:  Nonfocal  Psych: Normal affect   Labs    High Sensitivity Troponin:   Recent Labs  Lab 09/28/21 1940 09/28/21 2121  TROPONINIHS 2,318* 2,635*     Chemistry Recent Labs  Lab 09/28/21 1627 09/29/21 0750 09/30/21 0316 10/03/21 0606 10/04/21 0614 10/05/21 0742  NA 139 141   < > 138 137 138  K 3.8 3.9   < > 4.2 3.9 3.4*  CL 110 110   < > 113* 111 108  CO2 21* 23   < > 15* 18* 20*  GLUCOSE 281* 233*   < > 114* 134* 129*  BUN 34* 29*   < >  30* 39* 43*  CREATININE 2.18* 2.10*   < > 2.05* 2.30* 2.33*  CALCIUM 9.3 9.6   < > 8.5* 8.4* 9.1  MG  --  1.6*  --   --   --   --   PROT 6.8  --   --   --   --   --   ALBUMIN 3.2*  --   --   --   --   --   AST 51*  --   --   --   --   --   ALT 28  --   --   --   --   --   ALKPHOS 42  --   --   --   --   --   BILITOT 0.9  --   --   --   --   --   GFRNONAA 29* 30*   < > 31* 27* 27*  ANIONGAP 8 8   < > '10 8 10   '$ < > = values in this interval not displayed.    Lipids  Recent Labs  Lab 09/30/21 0316  CHOL 129  TRIG 106  HDL 40*  LDLCALC 68  CHOLHDL 3.2    Hematology Recent Labs  Lab 10/03/21 0606 10/04/21 0614 10/05/21 0742  WBC 9.7 9.0 11.9*  RBC 3.68* 3.34* 3.78*  HGB 11.5* 10.4* 11.6*  HCT 34.4* 30.4* 34.0*  MCV 93.5 91.0 89.9  MCH 31.3 31.1 30.7  MCHC 33.4 34.2 34.1  RDW 13.8 13.6 13.3  PLT 218 174 232   Thyroid No results for input(s): "TSH", "FREET4" in the last 168 hours.  BNP Recent Labs  Lab 09/28/21 1627  BNP 1,791.6*    DDimer No results for input(s): "DDIMER" in the last 168 hours.    Radiology    No results found.  Cardiac Studies   10/02/2021 CORONARY STENT INTERVENTION    Conclusion       Origin lesion before RPDA  is 95% stenosed.   A drug-eluting stent was successfully placed using a SYNERGY XD 3.50X16.   Post intervention, there is a 0% residual stenosis.   Successful PCI of SVG to RCA with DES x 1   Plan: DAPT with ASA 81 mg for one month and Plavix 12 months. If no bleeding issues plan to resume Eliquis 2.5 mg bid tomorrow.      Cardiac catheterization 09/30/2021  Severe multivessel CAD with 95% distal left main stenosis, 95 to 99% proximal LAD stenosis; 95-99% proximal circumflex stenosis before a diminutive OM1 vessel with 99% stenosis; and total occlusion of the proximal RCA after a proximal branch.   Patent LIMA to mid LAD.   Old occlusion of the SVG which had supplied the diagonal vessel.   Old occlusion of the SVG which had supplied the circumflex marginal vessel.   New 95% near ostial/proximal SVG graft stenosis in the sequential graft supplying the PDA and PLA vessel.   LV EDP 6 mmHg.   RECOMMENDATION: Patient recently presented with increasing chest pain.  Troponins have increased to 2635 most likely due to the new subtotal near ostial/proximal graft stenosis in the sequential graft supplying the PDA and PLA vessels.  The patient will be hydrated post procedure with his stage IV CKD.  We will have colleagues review and most likely will require attempt at staged PCI to the SVG supplying the PDA and PLA system of the RCA.     Diagnostic Dominance: Right  Echocardiogram 09/29/2021    1. Left ventricular ejection fraction, by estimation, is 30 to 35%. The  left ventricle has moderately decreased function. The left ventricle  demonstrates regional wall motion abnormalities (see scoring  diagram/findings for description). The left  ventricular internal cavity size was mildly dilated. There is mild  concentric left ventricular  hypertrophy. Indeterminate diastolic filling  due to E-A fusion.   2. Right ventricular systolic function is normal. The right ventricular  size is normal.   3. Left atrial size was severely dilated.   4. The mitral valve is abnormal. Moderate to severe mitral valve  regurgitation. No evidence of mitral stenosis.   5. The aortic valve is tricuspid. There is mild calcification of the  aortic valve. There is mild thickening of the aortic valve. Aortic valve  regurgitation is mild. Aortic valve sclerosis is present, with no evidence  of aortic valve stenosis.   Comparison(s): Changes from prior study are noted. The left ventricular  function is worsened   Patient Profile     88 y.o. male with a hx of CAD s/p CABG x 5 2002, aortic valve insufficiency and borderline aortic stenosis, paroxysmal atrial fibrillation/flutter on chronic anticoagulation, severe TR, chronic combined CHF, chronic venous insufficiency, mitral valve regurgitation, CKD IV, DM, HLD and hypertension presented with 3 weeks history of intermittent lower abdomen and epigastric/lower sternal pain (squeezing pressure) consistent with unstable angina/non-STEMI (peak troponin 2635), cardiac catheterization shows known chronic occlusion of SVG-OM and SVG-diagonal, with new 95% stenosis of the SVG-PDA-PLV and patent LIMA to LAD, echo shows interval marked decrease in LVEF to 30-35% with new inferior lateral wall motion abnormalities, s/p PCI-DES 10/02/2021.  Developed atrial fibrillation rapid ventricular sponsor on 10/03/2021.    Assessment & Plan    CHF: -  Excellent urine output yesterday. Feeling well today.  Lasix 80 mg po today, may want to dose PRN homegoing since it does not appear he was on lasix at home per [facility] review and has had syncope earlier this year requiring hydration. -   Not on RAAS inhibitors due to advanced CKD, no SGLT2 inhibitors for the same reason.  Plan to change metoprolol tartrate to metoprolol succinate at  discharge. Transition to metoprolol succinate 100 mg at discharge. MR: Ischemic mechanism due to dysfunction of inferior/inferolateral wall. CAD/NSTEMI: Successful PCI 34/28/76, but complicated by small groin hematoma.  Slight drop in hemoglobin, roughly 2 g/deciliter from baseline, stable now.  Denies angina. On aspirin and clopidogrel, plan to discharge on Eliquis as well, stopping aspirin after 30 days. CKD 4: Creatinine elevated but at recent baseline of 2-2.3. Reasonable to continue lasix 80 mg po today, and PRN at discharge. Avoid other nephrotoxic agents. HLP: Maximum dose rosuvastatin.  LDL less than 70.  Fenofibrate discontinued due to increased risk of toxicity with renal insufficiency. DM: Fair glycemic control with hemoglobin A1c 7.3%, acceptable for age. Parox Afib: Resume Eliquis at discharge if no evidence of bleeding. SR now, transition to metop succinate as above.   TOC follow up on 8/30.      For questions or updates, please contact [place] Please consult [email or web address] for contact info under        Signed, Elouise Munroe, MD  10/05/2021, 10:27 AM

## 2021-10-05 NOTE — Progress Notes (Signed)
Physical Therapy Treatment Patient Details Name: Nathan [name] MRN: [id number] DOB: November 11, 1933 Today's Date: 10/05/2021   History of Present Illness Pt is an 88 y.o. male admitted 09/28/21 with abdominal pain. CXR with cardiomegaly with pulmonary vascular congestion. Workup for acute on chronic HF, hiatal hernia. Abnormal echo 8/15, plan for coronary angiography 8/16. PMH includes CHF, CAD, DM, DVT, HLD, mitral valve regurgitation, orthostatic hypotension.    PT Comments    Pt ambulated 300' without AD but with mildly unstable gait, Since he will be home alone at times, recommend that he use RW for first 4 days home as he recovers from this stay. He has a RW at home and is agreeable. Plan is for d/c home later today.     Recommendations for follow up therapy are one component of a multi-disciplinary discharge planning process, led by the attending physician.  Recommendations may be updated based on patient status, additional functional criteria and insurance authorization.  Follow Up Recommendations  No PT follow up     Assistance Recommended at Discharge Intermittent Supervision/Assistance  Patient can return home with the following Assistance with cooking/housework;Assist for transportation;Help with stairs or ramp for entrance   Equipment Recommendations  None recommended by PT    Recommendations for Other Services       Precautions / Restrictions Precautions Precautions: Fall;Other (comment) Precaution Comments: chronic indwelling foley cath Restrictions Weight Bearing Restrictions: No     Mobility  Bed Mobility Overal bed mobility: Modified Independent                  Transfers Overall transfer level: Needs assistance Equipment used: None Transfers: Sit to/from Stand Sit to Stand: Supervision           General transfer comment: mildly unsteady with initial standing    Ambulation/Gait Ambulation/Gait assistance: Min guard, Supervision Gait Distance  (Feet): 300 Feet Assistive device: None Gait Pattern/deviations: Step-through pattern, Decreased stride length, Trunk flexed Gait velocity: Decreased Gait velocity interpretation: 1.31 - 2.62 ft/sec, indicative of limited community ambulator   General Gait Details: pt with unsteadiness with changes in direction and has had numerous falls at home, mostly in relation to blacking out. Discussed use of RW for next 4 days while he recovers from hospital stay. Pt agreeable   Stairs             Wheelchair Mobility    Modified Rankin (Stroke Patients Only)       Balance Overall balance assessment: Needs assistance Sitting-balance support: No upper extremity supported Sitting balance-[name]: Good     Standing balance support: No upper extremity supported, During functional activity Standing balance-[name]: Fair                              Cognition Arousal/Alertness: Awake/alert Behavior During Therapy: WFL for tasks assessed/performed Overall Cognitive Status: Within Functional Limits for tasks assessed                                          Exercises      General Comments General comments (skin integrity, edema, etc.): He up to 103 bpm with ambulation, SPO2 in upper 90's on RA      Pertinent Vitals/Pain Pain Assessment Pain Assessment: No/denies pain    Home Living  Prior Function            PT Goals (current goals can now be found in the care plan section) Acute Rehab PT Goals Patient Stated Goal: return home PT Goal Formulation: With patient Time For Goal Achievement: 10/14/21 Potential to Achieve Goals: Good Progress towards PT goals: Progressing toward goals    Frequency    Min 3X/week      PT Plan Current plan remains appropriate    Co-evaluation              AM-PAC PT "6 Clicks" Mobility   Outcome Measure  Help needed turning from your back to your side  while in a flat bed without using bedrails?: None Help needed moving from lying on your back to sitting on the side of a flat bed without using bedrails?: None Help needed moving to and from a bed to a chair (including a wheelchair)?: None Help needed standing up from a chair using your arms (e.g., wheelchair or bedside chair)?: A Little Help needed to walk in hospital room?: A Little Help needed climbing 3-5 steps with a railing? : A Little 6 Click Score: 21    End of Session Equipment Utilized During Treatment: Gait belt Activity Tolerance: Patient tolerated treatment well Patient left: with call bell/phone within reach;Other (comment);with nursing/sitter in room (in bathjroom) Nurse Communication: Mobility status PT Visit Diagnosis: Other abnormalities of gait and mobility (R26.89);Muscle weakness (generalized) (M62.81)     Time: 3790-2409 PT Time Calculation (min) (ACUTE ONLY): 15 min  Charges:  $Gait Training: 8-22 mins                     [place] chat preferred Office [place] 10/05/2021, 1:20 PM

## 2021-10-05 NOTE — Progress Notes (Signed)
CARDIAC REHAB PHASE I    Pt walking in hall with PT . Back to room to chair for lunch with call bell in reach. He tolerated walk well with no CP or SOB. Reviewed educations provided Saturday. All questions and concerns addressed. Plan to home today.   [phone] Vanessa Barbara, RN BSN 10/05/2021 12:20 PM

## 2021-10-05 NOTE — Progress Notes (Signed)
Heart Failure Stewardship Pharmacist Progress Note   PCP: Donnajean Lopes, MD PCP-Cardiologist: [name], MD    HPI:  88 yo M with PMH of CHF, MI, HTN, CKD IV, T2DM, PAF, mitral regurgitation. He presented to [place] ED on 8/14 with intermittent abdominal pain for the past 3 weeks, described as indigestion symptoms. No complaints of chest discomfort. He has noticed increased SOB with exertion and bending over. Pt has a chronic indwelling foley catheter that was removed. Urine cx sent for evaluation, now growing >100k ecoli.   CXR showed borderline cardiomegaly and pulmonary vascular congestion. ECHO on 8/15 showed LVEF 30-35% (40-45% on 04/2020) with regional wall motion abnormalities, and mild LVH. Taken to cath on 8/16 and found to have severe multivessel CAD. PCI with DES to SVG to RCA on 8/18. Back in afib RVR on 8/20, metoprolol increased. Back in SR on 8/21.  Current HF Medications: Diuretic: furosemide 80 mg PO daily Beta Blocker: metoprolol tartrate 50 mg BID  Prior to admission HF Medications: Beta blocker: Metoprolol tartrate '25mg'$  BID  Pertinent Lab Values: Serum creatinine 2.33, BUN 37, Potassium 4.3, Sodium 137, BNP 1791, Magnesium 1.6 (8/15), A1c 7.3  Vital Signs: Weight: 158 lbs (admission weight: 165 lbs) Blood pressure: 130/60s Heart rate: 60-80s I/O: -429m yesterday; net -5L  Medication Assistance / Insurance Benefits Check: Does the patient have prescription insurance?  Yes Type of insurance plan: [employer]  Does the patient qualify for medication assistance through manufacturers or grants?   Pending Eligible grants and/or patient assistance programs: pending Medication assistance applications in progress: none  Medication assistance applications approved: none Approved medication assistance renewals will be completed by: pending  Outpatient Pharmacy:  [facility] the [facility] willing  to [facility]? Pending Is the patient willing to [facility]   Pending    Assessment: 1. Acute on chronic systolic CHF (LVEF 346-27%, due to ischemic causes from prior NSTEMI. NYHA class III symptoms. - Not volume overloaded on exam. Continue furosemide 80 mg daily. Considering PRN at discharge. - On metoprolol tartrate 50 mg BID, with new HFrEF, will need to consolidate to metoprolol XL 100 mg daily prior to discharge - No ACE/ARB/ARNI or MRA with renal dysfunction.  - Catheter exchange showed >100k ecoli. Not treating per TRH. No SGLT2i with renal dysfunction.    Plan: 1) Medication changes recommended at this time: - Switch metoprolol tartrate to metoprolol XL 100 mg daily  2) Patient assistance: -Delene Lollcopay $40 - Jardiance copay $40  3)  Education  - Patient has been educated on current HF medications and potential additions to HF medication regimen - Patient verbalizes understanding that over the next few months, these medication doses may change and more medications may be added to optimize HF regimen - Patient has been educated on basic disease state pathophysiology and goals of therapy   MKerby Nora PharmD, BCPS Heart Failure Stewardship Pharmacist Phone ([phone] Please check [email or web address] for unit-specific pharmacist phone numbers

## 2021-10-07 DIAGNOSIS — I4891 Unspecified atrial fibrillation: Secondary | ICD-10-CM | POA: Diagnosis not present

## 2021-10-07 DIAGNOSIS — I252 Old myocardial infarction: Secondary | ICD-10-CM | POA: Diagnosis not present

## 2021-10-07 DIAGNOSIS — N184 Chronic kidney disease, stage 4 (severe): Secondary | ICD-10-CM | POA: Diagnosis not present

## 2021-10-08 ENCOUNTER — Telehealth (HOSPITAL_COMMUNITY): Payer: Self-pay

## 2021-10-08 NOTE — Telephone Encounter (Signed)
Per phase I cardiac rehab, fax referral to [facility] [place]. 

## 2021-10-12 DIAGNOSIS — E872 Acidosis, unspecified: Secondary | ICD-10-CM | POA: Diagnosis not present

## 2021-10-12 DIAGNOSIS — E559 Vitamin D deficiency, unspecified: Secondary | ICD-10-CM | POA: Diagnosis not present

## 2021-10-12 DIAGNOSIS — I129 Hypertensive chronic kidney disease with stage 1 through stage 4 chronic kidney disease, or unspecified chronic kidney disease: Secondary | ICD-10-CM | POA: Diagnosis not present

## 2021-10-12 DIAGNOSIS — N400 Enlarged prostate without lower urinary tract symptoms: Secondary | ICD-10-CM | POA: Diagnosis not present

## 2021-10-12 DIAGNOSIS — R55 Syncope and collapse: Secondary | ICD-10-CM | POA: Diagnosis not present

## 2021-10-12 DIAGNOSIS — N184 Chronic kidney disease, stage 4 (severe): Secondary | ICD-10-CM | POA: Diagnosis not present

## 2021-10-12 DIAGNOSIS — I5042 Chronic combined systolic (congestive) and diastolic (congestive) heart failure: Secondary | ICD-10-CM | POA: Diagnosis not present

## 2021-10-12 DIAGNOSIS — E1122 Type 2 diabetes mellitus with diabetic chronic kidney disease: Secondary | ICD-10-CM | POA: Diagnosis not present

## 2021-10-12 DIAGNOSIS — R809 Proteinuria, unspecified: Secondary | ICD-10-CM | POA: Diagnosis not present

## 2021-10-13 DIAGNOSIS — I480 Paroxysmal atrial fibrillation: Secondary | ICD-10-CM | POA: Diagnosis not present

## 2021-10-13 DIAGNOSIS — I25709 Atherosclerosis of coronary artery bypass graft(s), unspecified, with unspecified angina pectoris: Secondary | ICD-10-CM | POA: Diagnosis not present

## 2021-10-13 DIAGNOSIS — Z7901 Long term (current) use of anticoagulants: Secondary | ICD-10-CM | POA: Diagnosis not present

## 2021-10-13 DIAGNOSIS — I5042 Chronic combined systolic (congestive) and diastolic (congestive) heart failure: Secondary | ICD-10-CM | POA: Diagnosis not present

## 2021-10-13 DIAGNOSIS — N184 Chronic kidney disease, stage 4 (severe): Secondary | ICD-10-CM | POA: Diagnosis not present

## 2021-10-13 DIAGNOSIS — N320 Bladder-neck obstruction: Secondary | ICD-10-CM | POA: Diagnosis not present

## 2021-10-13 DIAGNOSIS — E1129 Type 2 diabetes mellitus with other diabetic kidney complication: Secondary | ICD-10-CM | POA: Diagnosis not present

## 2021-10-13 DIAGNOSIS — I130 Hypertensive heart and chronic kidney disease with heart failure and stage 1 through stage 4 chronic kidney disease, or unspecified chronic kidney disease: Secondary | ICD-10-CM | POA: Diagnosis not present

## 2021-10-13 DIAGNOSIS — I214 Non-ST elevation (NSTEMI) myocardial infarction: Secondary | ICD-10-CM | POA: Diagnosis not present

## 2021-10-14 ENCOUNTER — Ambulatory Visit (HOSPITAL_COMMUNITY)
Admit: 2021-10-14 | Discharge: 2021-10-14 | Disposition: A | Discharge status: Home / Self Care | Payer: PPO | Attending: Cardiology | Admitting: Cardiology

## 2021-10-14 ENCOUNTER — Encounter (HOSPITAL_COMMUNITY): Payer: Self-pay

## 2021-10-14 ENCOUNTER — Telehealth (HOSPITAL_COMMUNITY): Payer: Self-pay

## 2021-10-14 VITALS — BP 174/72 | HR 71 | Ht 71.0 in | Wt 166.8 lb

## 2021-10-14 DIAGNOSIS — R079 Chest pain, unspecified: Secondary | ICD-10-CM | POA: Insufficient documentation

## 2021-10-14 DIAGNOSIS — E1122 Type 2 diabetes mellitus with diabetic chronic kidney disease: Secondary | ICD-10-CM | POA: Diagnosis not present

## 2021-10-14 DIAGNOSIS — N184 Chronic kidney disease, stage 4 (severe): Secondary | ICD-10-CM | POA: Diagnosis not present

## 2021-10-14 DIAGNOSIS — I480 Paroxysmal atrial fibrillation: Secondary | ICD-10-CM | POA: Insufficient documentation

## 2021-10-14 DIAGNOSIS — I2510 Atherosclerotic heart disease of native coronary artery without angina pectoris: Secondary | ICD-10-CM | POA: Diagnosis not present

## 2021-10-14 DIAGNOSIS — I10 Essential (primary) hypertension: Secondary | ICD-10-CM | POA: Diagnosis not present

## 2021-10-14 DIAGNOSIS — Z951 Presence of aortocoronary bypass graft: Secondary | ICD-10-CM | POA: Insufficient documentation

## 2021-10-14 DIAGNOSIS — I340 Nonrheumatic mitral (valve) insufficiency: Secondary | ICD-10-CM | POA: Insufficient documentation

## 2021-10-14 DIAGNOSIS — E785 Hyperlipidemia, unspecified: Secondary | ICD-10-CM | POA: Insufficient documentation

## 2021-10-14 DIAGNOSIS — Z7901 Long term (current) use of anticoagulants: Secondary | ICD-10-CM | POA: Insufficient documentation

## 2021-10-14 DIAGNOSIS — Z9861 Coronary angioplasty status: Secondary | ICD-10-CM | POA: Diagnosis not present

## 2021-10-14 DIAGNOSIS — I5022 Chronic systolic (congestive) heart failure: Secondary | ICD-10-CM | POA: Diagnosis not present

## 2021-10-14 DIAGNOSIS — I130 Hypertensive heart and chronic kidney disease with heart failure and stage 1 through stage 4 chronic kidney disease, or unspecified chronic kidney disease: Secondary | ICD-10-CM | POA: Insufficient documentation

## 2021-10-14 DIAGNOSIS — Z955 Presence of coronary angioplasty implant and graft: Secondary | ICD-10-CM | POA: Insufficient documentation

## 2021-10-14 MED ORDER — HYDRALAZINE HCL 25 MG PO TABS: 25.0000 mg | ORAL_TABLET | Freq: Three times a day (TID) | ORAL | 0 refills | Status: DC

## 2021-10-14 NOTE — Progress Notes (Signed)
ReDS Vest / Clip - 10/14/21 1100       ReDS Vest / Clip   Station Marker C    Ruler Value 31    ReDS Value Range Low volume    ReDS Actual Value 28    Anatomical Comments Sitting

## 2021-10-14 NOTE — Patient Instructions (Addendum)
EKG done today.  Reds Clip done today.  No Labs done today.  START Hydralazine '25mg'$  (1 tablet) by mouth 3 times daily.   ON TUESDAY SEPTEMBER 19TH STOP TAKING YOUR ASPIRIN.   No other medication changes were made. Please continue all current medications as prescribed.  Your physician recommends that you schedule a follow-up appointment in: 4 weeks with [facility].

## 2021-10-14 NOTE — Telephone Encounter (Signed)
Called to confirm Heart & Vascular Transitions of Care appointment at [place] today. Patient reminded to bring all medications and pill box organizer with them. Confirmed patient has transportation. Gave directions, instructed to utilize [place] parking.  Confirmed appointment prior to ending call.   Pricilla Holm, MSN, RN

## 2021-10-14 NOTE — Progress Notes (Signed)
HEART & VASCULAR TRANSITION OF CARE CONSULT NOTE     Referring Physician: Dr. Margaretann Loveless  Primary Care: Donnajean Lopes, MD Primary Cardiologist:Philip Nahser, MD   HPI: Referred to clinic by Dr. Margaretann Loveless for heart failure consultation.   88 y/o male w/ h/o chronic systolic heart failure, PAF on Eliquis, CAD s/p CABG, HTN, HLD, Type 2DM, Stage IV CKD (b/l SCr ~2.3) and urinary retention w/ chronic indwelling foley catheter.   Echo 3/22 EF 40-45%, RV mildly reduced, mild-mod MR   Recently presented to [facility] w/ CP. Ruled in for NSTEMI, HS trop peaking at 2635. Also found to be in a/c CHF. BNP elevated at 1791. Diuresed w/ IV Lasix. 2D echo showed drop in EF down to 30-35%, RV normal, mod-severe MR w/ severe LAE.  Diagnostic LHC 8/16 demonstrated the following:   Severe multivessel CAD with 95% distal left main stenosis, 95 to 99% proximal LAD stenosis; 95-99% proximal circumflex stenosis before a diminutive OM1 vessel with 99% stenosis; and total occlusion of the proximal RCA after a proximal branch.  Patent LIMA to mid LAD.  Old occlusion of the SVG which had supplied the diagonal vessel.  Old occlusion of the SVG which had supplied the circumflex marginal vessel.  New 95% near ostial/proximal SVG graft stenosis in the sequential graft supplying the PDA and PLA vessel.  Given CKD, he was hydrated w/ IVFs and underwent staged PCI w/ DES to the SVG-RCA on 8/18. Tolerated well and placed on DAPT w/ ASA + Plavix (plan to stop ASA after 30 days). SCr remained stable post cath. He did develop rt groin hematoma, post cath, w/ drop in hgb from 13>>11.6 but did not require transfusion.   Hospitalization also notable for afib w/ RVR but he spontaneously converted to NSR.   He was discharged on 8/21 w/ PRN lasix. GDMT limited by CKD (only only Toprol XL). D/c Wt 154 lb.  Referred to [facility] clinic.  Presents today for assessment. Here w/ his daughter. Ambulating w/o assistance. Reports doing  fairly well w/o CP and no exertional dyspnea w/ ALDs, though he reports he has been very cautious not to overexert himself. C/w rt groin ecchymosis but groin is soft and NT.    EKG shows NSR 63 bpm, RBBB. BP is elevated 174/72, checked x 2, 671I systolic on repeat. I personally reviewed hospital BPs, SBPs variable 130s-170s.   Wt is up post d/c at 166 lb, Though ReDs only 28%. Not volume overload on exam.    Cardiac Testing   Echocardiogram 09/29/2021    1. Left ventricular ejection fraction, by estimation, is 30 to 35%. The  left ventricle has moderately decreased function. The left ventricle  demonstrates regional wall motion abnormalities (see scoring  diagram/findings for description). The left  ventricular internal cavity size was mildly dilated. There is mild  concentric left ventricular hypertrophy. Indeterminate diastolic filling  due to E-A fusion.   2. Right ventricular systolic function is normal. The right ventricular  size is normal.   3. Left atrial size was severely dilated.   4. The mitral valve is abnormal. Moderate to severe mitral valve  regurgitation. No evidence of mitral stenosis.   5. The aortic valve is tricuspid. There is mild calcification of the  aortic valve. There is mild thickening of the aortic valve. Aortic valve  regurgitation is mild. Aortic valve sclerosis is present, with no evidence  of aortic valve stenosis.   Comparison(s): Changes from prior study are noted. The left  ventricular  function is worsened  Cardiac catheterization 09/30/2021  Severe multivessel CAD with 95% distal left main stenosis, 95 to 99% proximal LAD stenosis; 95-99% proximal circumflex stenosis before a diminutive OM1 vessel with 99% stenosis; and total occlusion of the proximal RCA after a proximal branch.   Patent LIMA to mid LAD.   Old occlusion of the SVG which had supplied the diagonal vessel.   Old occlusion of the SVG which had supplied the circumflex marginal  vessel.   New 95% near ostial/proximal SVG graft stenosis in the sequential graft supplying the PDA and PLA vessel.   LV EDP 6 mmHg.    Origin lesion before RPDA  is 95% stenosed.   A drug-eluting stent was successfully placed using a SYNERGY XD [id number].   Post intervention, there is a 0% residual stenosis.   Successful PCI of SVG to RCA with DES x 1    Diagnostic Dominance: Right  Intervention      Review of Systems: [y] = yes, _0  = no   General: Weight gain _1 ; Weight loss _2 ; Anorexia _3 ; Fatigue _4 ; Fever _5 ; Chills _6 ; Weakness _7   Cardiac: Chest pain/pressure _8 ; Resting SOB _9 ; Exertional SOB _10 ; Orthopnea _11 ; Pedal Edema _12 ; Palpitations _13 ; Syncope _14 ; Presyncope _15 ; Paroxysmal nocturnal dyspnea_16   Pulmonary: Cough _17 ; Wheezing_18 ; Hemoptysis_19 ; Sputum _20 ; Snoring _21   GI: Vomiting_22 ; Dysphagia_23 ; Melena_24 ; Hematochezia _25 ; Heartburn_26 ; Abdominal pain _27 ; Constipation _28 ; Diarrhea _29 ; BRBPR _30   GU: Hematuria_31 ; Dysuria _32 ; Nocturia_33   Vascular: Pain in legs with walking _34 ; Pain in feet with lying flat _35 ; Non-healing sores _36 ; Stroke _37 ; TIA _38 ; Slurred speech _39 ;  Neuro: Headaches_40 ; Vertigo_41 ; Seizures_42 ; Paresthesias_43 ;Blurred vision _44 ; Diplopia _45 ; Vision changes _46   Ortho/Skin: Arthritis _47 ; Joint pain _48 ; Muscle pain _49 ; Joint swelling _50 ; Back Pain _51 ; Rash _52   Psych: Depression_53 ; Anxiety_54   Heme: Bleeding problems _55 ; Clotting disorders _56 ; Anemia _57   Endocrine: Diabetes [Y ]; Thyroid dysfunction_58    Past Medical History:  Diagnosis Date   Acid reflux    Aortic insufficiency    Cholecystitis    Combined systolic and diastolic heart failure ([place])    Echo 04/2018: inf-lat HK, EF 40-45, severe basal septal hypertrophy, mild conc LVH, Gr 1 DD, severe LAE, mild to mod TR, mod AI, asc Aorta 39 mm   Coronary artery disease 2002   CABG x5   Diabetes mellitus    DVT (deep venous thrombosis) (HCC)     previously on coumadin   Hypercholesteremia    Hyperlipidemia    Mitral valve regurgitation    Skin cancer    tumor removed off of his back    Current Outpatient Medications  Medication Sig Dispense Refill   albuterol (VENTOLIN HFA) 108 (90 Base) MCG/ACT inhaler Inhale 2 puffs into the lungs every 4 (four) hours as needed for wheezing or shortness of breath.     apixaban (ELIQUIS) 2.5 MG TABS tablet Take 2.5 mg by mouth 2 (two) times daily.     aspirin 81 MG chewable tablet Chew 1 tablet (81 mg total) by mouth daily. 30 tablet 0   Blood Glucose Monitoring Suppl (ONETOUCH VERIO) w/Device KIT  cholecalciferol (VITAMIN D3) 25 MCG (1000 UNIT) tablet Take 1,000 Units by mouth daily.     clopidogrel (PLAVIX) 75 MG tablet Take 1 tablet (75 mg total) by mouth daily. 30 tablet 2   finasteride (PROSCAR) 5 MG tablet Take 5 mg by mouth daily.     insulin NPH-regular Human (NOVOLIN 70/30) (70-30) 100 UNIT/ML injection Inject 10-20 Units into the skin See admin instructions. 10 units in the morning & 20 units at dinner Sliding Scale if BS>150     magnesium oxide (MAG-OX) 400 MG tablet Take 400 mg by mouth daily.     megestrol (MEGACE) 20 MG tablet Take 20 mg by mouth daily.     metoprolol succinate (TOPROL XL) 100 MG 24 hr tablet Take 1 tablet (100 mg total) by mouth daily. Take with or immediately following a meal. 30 tablet 11   Multiple Vitamins-Minerals (CENTRUM SILVER PO) Take 1 tablet by mouth daily.     omeprazole (PRILOSEC) 20 MG capsule Take 20 mg by mouth 2 (two) times daily.      ONETOUCH VERIO test strip      RELION INSULIN SYRINGE 1ML/31G 31G X 5/16" 1 ML MISC USE 1 TWICE DAILY AS DIRECTED  6   rosuvastatin (CRESTOR) 40 MG tablet Take 1 tablet (40 mg total) by mouth daily. 30 tablet 2   sodium bicarbonate 650 MG tablet Take 650 mg by mouth 2 (two) times daily.     furosemide (LASIX) 80 MG tablet Take 1 tablet (80 mg total) by mouth daily as needed for fluid or edema (if you notice  shortness of breath , leg swelling). (Patient not taking: Reported on 10/14/2021) 30 tablet 0   nitroGLYCERIN (NITROSTAT) 0.4 MG SL tablet Place 1 tablet (0.4 mg total) under the tongue every 5 (five) minutes as needed for chest pain. (Patient not taking: Reported on 09/29/2021) 25 tablet 5   potassium chloride SA (KLOR-CON M) 20 MEQ tablet Take 2 tablets (40 mEq total) by mouth daily as needed (take on the day you need to take Furosemide). (Patient not taking: Reported on 10/14/2021) 30 tablet 0   No current facility-administered medications for this encounter.    Allergies  Allergen Reactions   Flomax [Tamsulosin Hcl] Other (See Comments)    Dizzy   Lasix [Furosemide]     Dizziness   Silodosin Other (See Comments)    dizziness      Social History   Socioeconomic History   Marital status: Divorced    Spouse name: Not on file   Number of children: 1   Years of education: Not on file   Highest education level: High school graduate  Occupational History   Occupation: Retired  Tobacco Use   Smoking status: Never   Smokeless tobacco: Never  Vaping Use   Vaping Use: Never used  Substance and Sexual Activity   Alcohol use: No   Drug use: No   Sexual activity: Not Currently  Other Topics Concern   Not on file  Social History Narrative   Not on file   Social Determinants of Health   Financial Resource Strain: Low Risk  (10/02/2021)   Overall Financial Resource Strain (CARDIA)    Difficulty of Paying Living Expenses: Not hard at all  Food Insecurity: No Food Insecurity (10/02/2021)   Hunger Vital Sign    Worried About Running Out of Food in the Last Year: Never true    Ran Out of Food in the Last Year: Never true  Transportation Needs:  No Transportation Needs (10/02/2021)   PRAPARE - Hydrologist (Medical): No    Lack of Transportation (Non-Medical): No  Physical Activity: Not on file  Stress: Not on file  Social Connections: Not on file   Intimate Partner Violence: Not on file      Family History  Problem Relation Age of Onset   Heart attack Father    Hypertension Mother    Diabetes Brother    Cancer Sister        breast   Cancer Brother        throat & mouth    Vitals:   10/14/21 1106  BP: (!) 174/72  Pulse: 71  SpO2: 100%  Weight: 75.7 kg (166 lb 12.8 oz)  Height: _0  (1.803 m)    PHYSICAL EXAM: ReDS 28%  General:  Well appearing elderly male. No respiratory difficulty HEENT: normal Neck: supple. no JVD. Carotids 2+ bilat; no bruits. No lymphadenopathy or thryomegaly appreciated. Cor: PMI nondisplaced. Regular rate & rhythm. 2/6 MR murmur  Lungs: clear Abdomen: soft, nontender, nondistended. No hepatosplenomegaly. No bruits or masses. Good bowel sounds. Extremities: no cyanosis, clubbing, rash, edema Neuro: alert & oriented x 3, cranial nerves grossly intact. moves all 4 extremities w/o difficulty. Affect pleasant.  ECG: NSR 63 bpm, RBBB    ASSESSMENT & PLAN:  Chronic Systolic Heart Failure - Echo 3/22 EF 40-45%, RV mildly reduced, mild-mod MR  - Echo 8/23 EF 30-35%, RV normal  - NYHA Class II-III, confounded by advanced age and deconditioning - euvolemic on exam and by ReDs assessment, 28%  - continue Lasix PRN  - GDMT limited by Stage IV CKD (no ARB/ARNi, spiro nor digoxin)  - Add Hydralazine 25 mg tid for afterload reduction  - Plan addition of LA nitrate next if BP room  - Continue Toprol XL 100 mg daily  - no SGLT2i given chronic indwelling Foley catheter and risk for GU infections  - discussed daily wts, PRN diuretic dosing, low sodium diet and fluid restriction   2. CAD - s/p CABG x 4 - LHC 8/23  - patent LIMA-LAD - 95% ostial occlusion of SVG-RPDA>>treated w/ PCI + DES - occluded SVG-D1 and occluded SVG-1st OM  - stable w/o CP  - Continue triple therapy w/ ASA + Plavix + Eliquis x 30 days (stop ASA after 9/18) - Continue Crestor 40 mg daily  - Continue Toprol XL 100 mg daily     3. PAF - NSR on EKG today - Continue Toprol  XL  - Continue Eliquis for a/c  4. Hypertension  - elevated, checked x 2. Also elevated during recent admit - continue Toprol XL  - add hydralazine 25 mg tid  - recommend addition of LA nitrate next (cautiously titrate meds slowly given age and renal fx)    5. Stage IV CKD  - b/l SCr ~2.3, GFR 27 - avoiding SGLT2i given chronic indwelling foley cath and risk for GU infections  - obtain labs from PCP office (done yesterday)   6. Mitral Regurgitation  - mod-severe on recent echo  - likely functional in setting of dilated  LA/LV - HF optimization/ Afib management per above - if symptoms worsen, can refer to structural heart team for consideration for MitraClip  - cardiology will continue to follow   7. Type 2DM - Hgb A1c 7.3 - management per PCP    NYHA II-III GDMT  Diuretic- Lasix PRN  BB- Toprol XL 100 mg daily  Ace/ARB/ARNI - No Stage IV CKD  MRA- No Stage IV CKD  SGLT2i - increased risk for GU infections (chronic indwelling Foley cath)     Referred to HFSW (PCP, Medications, Transportation, ETOH Abuse, Drug Abuse, Insurance, Museum/gallery curator ): No Refer to Pharmacy: No Refer to Home Health: No Refer to Advanced Heart Failure Clinic: No  Refer to General Cardiology: Yes (continued care by Dr. Acie Fredrickson)   Follow up  w/ Gen Cardis in 4 wks

## 2021-10-16 ENCOUNTER — Ambulatory Visit (INDEPENDENT_AMBULATORY_CARE_PROVIDER_SITE_OTHER): Payer: PPO | Admitting: *Deleted

## 2021-10-16 DIAGNOSIS — M2042 Other hammer toe(s) (acquired), left foot: Secondary | ICD-10-CM

## 2021-10-16 DIAGNOSIS — E118 Type 2 diabetes mellitus with unspecified complications: Secondary | ICD-10-CM | POA: Diagnosis not present

## 2021-10-16 DIAGNOSIS — I872 Venous insufficiency (chronic) (peripheral): Secondary | ICD-10-CM

## 2021-10-16 DIAGNOSIS — M2010 Hallux valgus (acquired), unspecified foot: Secondary | ICD-10-CM

## 2021-10-16 DIAGNOSIS — D689 Coagulation defect, unspecified: Secondary | ICD-10-CM

## 2021-10-16 DIAGNOSIS — M2041 Other hammer toe(s) (acquired), right foot: Secondary | ICD-10-CM | POA: Diagnosis not present

## 2021-10-16 NOTE — Progress Notes (Signed)
Patient presents today to pick up diabetic shoes and insoles.  Patient was dispensed 1 pair of diabetic shoes and 3 pairs of foam casted diabetic insoles. Fit was satisfactory. Instructions for break-in and wear was reviewed and a copy was given to the patient.   Re-appointment for regularly scheduled diabetic foot care visits or if they should experience any trouble with the shoes or insoles.  

## 2021-10-21 DIAGNOSIS — I480 Paroxysmal atrial fibrillation: Secondary | ICD-10-CM | POA: Diagnosis not present

## 2021-10-21 DIAGNOSIS — L03116 Cellulitis of left lower limb: Secondary | ICD-10-CM | POA: Diagnosis not present

## 2021-10-21 DIAGNOSIS — I130 Hypertensive heart and chronic kidney disease with heart failure and stage 1 through stage 4 chronic kidney disease, or unspecified chronic kidney disease: Secondary | ICD-10-CM | POA: Diagnosis not present

## 2021-10-22 ENCOUNTER — Other Ambulatory Visit (HOSPITAL_COMMUNITY): Payer: Self-pay | Admitting: Cardiology

## 2021-10-26 DIAGNOSIS — N1832 Chronic kidney disease, stage 3b: Secondary | ICD-10-CM | POA: Diagnosis not present

## 2021-10-26 DIAGNOSIS — I480 Paroxysmal atrial fibrillation: Secondary | ICD-10-CM | POA: Diagnosis not present

## 2021-10-26 DIAGNOSIS — I10 Essential (primary) hypertension: Secondary | ICD-10-CM | POA: Diagnosis not present

## 2021-10-26 DIAGNOSIS — Z794 Long term (current) use of insulin: Secondary | ICD-10-CM | POA: Diagnosis not present

## 2021-10-26 DIAGNOSIS — R42 Dizziness and giddiness: Secondary | ICD-10-CM | POA: Diagnosis not present

## 2021-10-26 DIAGNOSIS — I25810 Atherosclerosis of coronary artery bypass graft(s) without angina pectoris: Secondary | ICD-10-CM | POA: Diagnosis not present

## 2021-10-26 DIAGNOSIS — E1121 Type 2 diabetes mellitus with diabetic nephropathy: Secondary | ICD-10-CM | POA: Diagnosis not present

## 2021-10-26 DIAGNOSIS — L03116 Cellulitis of left lower limb: Secondary | ICD-10-CM | POA: Diagnosis not present

## 2021-10-26 DIAGNOSIS — I5042 Chronic combined systolic (congestive) and diastolic (congestive) heart failure: Secondary | ICD-10-CM | POA: Diagnosis not present

## 2021-10-26 DIAGNOSIS — N320 Bladder-neck obstruction: Secondary | ICD-10-CM | POA: Diagnosis not present

## 2021-10-27 DIAGNOSIS — I4819 Other persistent atrial fibrillation: Secondary | ICD-10-CM | POA: Diagnosis not present

## 2021-10-27 DIAGNOSIS — Z7901 Long term (current) use of anticoagulants: Secondary | ICD-10-CM | POA: Diagnosis not present

## 2021-10-27 DIAGNOSIS — R338 Other retention of urine: Secondary | ICD-10-CM | POA: Diagnosis not present

## 2021-10-27 DIAGNOSIS — Z515 Encounter for palliative care: Secondary | ICD-10-CM | POA: Diagnosis not present

## 2021-10-27 DIAGNOSIS — N401 Enlarged prostate with lower urinary tract symptoms: Secondary | ICD-10-CM | POA: Diagnosis not present

## 2021-11-24 DIAGNOSIS — R338 Other retention of urine: Secondary | ICD-10-CM | POA: Diagnosis not present

## 2021-12-02 DIAGNOSIS — L820 Inflamed seborrheic keratosis: Secondary | ICD-10-CM | POA: Diagnosis not present

## 2021-12-02 DIAGNOSIS — L570 Actinic keratosis: Secondary | ICD-10-CM | POA: Diagnosis not present

## 2021-12-02 DIAGNOSIS — L298 Other pruritus: Secondary | ICD-10-CM | POA: Diagnosis not present

## 2021-12-02 DIAGNOSIS — Z85828 Personal history of other malignant neoplasm of skin: Secondary | ICD-10-CM | POA: Diagnosis not present

## 2021-12-02 DIAGNOSIS — Z08 Encounter for follow-up examination after completed treatment for malignant neoplasm: Secondary | ICD-10-CM | POA: Diagnosis not present

## 2021-12-02 DIAGNOSIS — L821 Other seborrheic keratosis: Secondary | ICD-10-CM | POA: Diagnosis not present

## 2021-12-24 DIAGNOSIS — D692 Other nonthrombocytopenic purpura: Secondary | ICD-10-CM | POA: Diagnosis not present

## 2021-12-24 DIAGNOSIS — Z515 Encounter for palliative care: Secondary | ICD-10-CM | POA: Diagnosis not present

## 2021-12-25 ENCOUNTER — Ambulatory Visit: Payer: PPO | Admitting: Podiatry

## 2021-12-25 ENCOUNTER — Encounter: Payer: Self-pay | Admitting: Podiatry

## 2021-12-25 DIAGNOSIS — D689 Coagulation defect, unspecified: Secondary | ICD-10-CM

## 2021-12-25 DIAGNOSIS — N184 Chronic kidney disease, stage 4 (severe): Secondary | ICD-10-CM

## 2021-12-25 DIAGNOSIS — E118 Type 2 diabetes mellitus with unspecified complications: Secondary | ICD-10-CM

## 2021-12-25 DIAGNOSIS — I872 Venous insufficiency (chronic) (peripheral): Secondary | ICD-10-CM

## 2021-12-25 DIAGNOSIS — M79675 Pain in left toe(s): Secondary | ICD-10-CM | POA: Diagnosis not present

## 2021-12-25 DIAGNOSIS — M79674 Pain in right toe(s): Secondary | ICD-10-CM

## 2021-12-25 DIAGNOSIS — B351 Tinea unguium: Secondary | ICD-10-CM

## 2021-12-25 NOTE — Progress Notes (Signed)
This patient returns to my office for at risk foot care.  This patient requires this care by a professional since this patient will be at risk due to having diabetes type 2, CKD and chronic insufficiensy.  Patient is taking eliquiss.  This patient is unable to cut nails himself since the patient cannot reach his nails.These nails are painful walking and wearing shoes.  This patient presents for at risk foot care today.  General Appearance  Alert, conversant and in no acute stress.  Vascular  Dorsalis pedis and posterior tibial  pulses are palpable  bilaterally.  Capillary return is within normal limits  bilaterally. Temperature is within normal limits  bilaterally.  Neurologic  Senn-Weinstein monofilament wire test diminished   bilaterally. Muscle power within normal limits bilaterally.  Nails Thick disfigured discolored nails with subungual debris  from hallux to fifth toes bilaterally. No evidence of bacterial infection or drainage bilaterally.  Orthopedic  No limitations of motion  feet .  No crepitus or effusions noted.  Hammer toes second  B/L.  MCJ DJD  B/L. HAV  B/L.  Skin  normotropic skin with no porokeratosis noted bilaterally.  No signs of infections or ulcers noted.  Asymptomatic callus 1st MPJ left foot.   Onychomycosis  Pain in right toes  Pain in left toes  Consent was obtained for treatment procedures.   Mechanical debridement of nails 1-5  bilaterally performed with a nail nipper.  Filed with dremel without incident.  Patient waiting on his diabetic shoes.   Return office visit  3 months                   Told patient to return for periodic foot care and evaluation due to potential at risk complications.   Moataz Tavis DPM  

## 2021-12-30 ENCOUNTER — Other Ambulatory Visit: Payer: Self-pay | Admitting: Cardiovascular Disease

## 2021-12-30 DIAGNOSIS — N401 Enlarged prostate with lower urinary tract symptoms: Secondary | ICD-10-CM | POA: Diagnosis not present

## 2021-12-30 DIAGNOSIS — R338 Other retention of urine: Secondary | ICD-10-CM | POA: Diagnosis not present

## 2022-01-04 DIAGNOSIS — M542 Cervicalgia: Secondary | ICD-10-CM | POA: Diagnosis not present

## 2022-01-04 DIAGNOSIS — I480 Paroxysmal atrial fibrillation: Secondary | ICD-10-CM | POA: Diagnosis not present

## 2022-01-04 DIAGNOSIS — I130 Hypertensive heart and chronic kidney disease with heart failure and stage 1 through stage 4 chronic kidney disease, or unspecified chronic kidney disease: Secondary | ICD-10-CM | POA: Diagnosis not present

## 2022-01-04 DIAGNOSIS — G589 Mononeuropathy, unspecified: Secondary | ICD-10-CM | POA: Diagnosis not present

## 2022-01-19 DIAGNOSIS — H53143 Visual discomfort, bilateral: Secondary | ICD-10-CM | POA: Diagnosis not present

## 2022-01-19 DIAGNOSIS — H524 Presbyopia: Secondary | ICD-10-CM | POA: Diagnosis not present

## 2022-01-19 DIAGNOSIS — H01002 Unspecified blepharitis right lower eyelid: Secondary | ICD-10-CM | POA: Diagnosis not present

## 2022-01-19 DIAGNOSIS — H52223 Regular astigmatism, bilateral: Secondary | ICD-10-CM | POA: Diagnosis not present

## 2022-01-19 DIAGNOSIS — H43392 Other vitreous opacities, left eye: Secondary | ICD-10-CM | POA: Diagnosis not present

## 2022-01-19 DIAGNOSIS — H43812 Vitreous degeneration, left eye: Secondary | ICD-10-CM | POA: Diagnosis not present

## 2022-01-19 DIAGNOSIS — Z961 Presence of intraocular lens: Secondary | ICD-10-CM | POA: Diagnosis not present

## 2022-01-19 DIAGNOSIS — N184 Chronic kidney disease, stage 4 (severe): Secondary | ICD-10-CM | POA: Diagnosis not present

## 2022-01-19 DIAGNOSIS — H5203 Hypermetropia, bilateral: Secondary | ICD-10-CM | POA: Diagnosis not present

## 2022-01-19 DIAGNOSIS — Z9849 Cataract extraction status, unspecified eye: Secondary | ICD-10-CM | POA: Diagnosis not present

## 2022-01-19 DIAGNOSIS — H01001 Unspecified blepharitis right upper eyelid: Secondary | ICD-10-CM | POA: Diagnosis not present

## 2022-01-19 DIAGNOSIS — E119 Type 2 diabetes mellitus without complications: Secondary | ICD-10-CM | POA: Diagnosis not present

## 2022-01-25 DIAGNOSIS — R1030 Lower abdominal pain, unspecified: Secondary | ICD-10-CM | POA: Diagnosis not present

## 2022-01-25 DIAGNOSIS — E876 Hypokalemia: Secondary | ICD-10-CM | POA: Diagnosis not present

## 2022-01-25 DIAGNOSIS — R739 Hyperglycemia, unspecified: Secondary | ICD-10-CM | POA: Diagnosis not present

## 2022-01-25 DIAGNOSIS — R1084 Generalized abdominal pain: Secondary | ICD-10-CM | POA: Diagnosis not present

## 2022-01-25 DIAGNOSIS — I493 Ventricular premature depolarization: Secondary | ICD-10-CM | POA: Diagnosis not present

## 2022-01-25 DIAGNOSIS — I4891 Unspecified atrial fibrillation: Secondary | ICD-10-CM | POA: Diagnosis not present

## 2022-01-25 DIAGNOSIS — R339 Retention of urine, unspecified: Secondary | ICD-10-CM | POA: Diagnosis not present

## 2022-01-25 DIAGNOSIS — I4510 Unspecified right bundle-branch block: Secondary | ICD-10-CM | POA: Diagnosis not present

## 2022-01-25 DIAGNOSIS — N3090 Cystitis, unspecified without hematuria: Secondary | ICD-10-CM | POA: Diagnosis not present

## 2022-01-26 DIAGNOSIS — I493 Ventricular premature depolarization: Secondary | ICD-10-CM | POA: Diagnosis not present

## 2022-01-26 DIAGNOSIS — E872 Acidosis, unspecified: Secondary | ICD-10-CM | POA: Diagnosis not present

## 2022-01-26 DIAGNOSIS — I5042 Chronic combined systolic (congestive) and diastolic (congestive) heart failure: Secondary | ICD-10-CM | POA: Diagnosis not present

## 2022-01-26 DIAGNOSIS — N1832 Chronic kidney disease, stage 3b: Secondary | ICD-10-CM | POA: Diagnosis not present

## 2022-01-26 DIAGNOSIS — N400 Enlarged prostate without lower urinary tract symptoms: Secondary | ICD-10-CM | POA: Diagnosis not present

## 2022-01-26 DIAGNOSIS — I129 Hypertensive chronic kidney disease with stage 1 through stage 4 chronic kidney disease, or unspecified chronic kidney disease: Secondary | ICD-10-CM | POA: Diagnosis not present

## 2022-01-26 DIAGNOSIS — E559 Vitamin D deficiency, unspecified: Secondary | ICD-10-CM | POA: Diagnosis not present

## 2022-01-26 DIAGNOSIS — E1122 Type 2 diabetes mellitus with diabetic chronic kidney disease: Secondary | ICD-10-CM | POA: Diagnosis not present

## 2022-01-26 DIAGNOSIS — R55 Syncope and collapse: Secondary | ICD-10-CM | POA: Diagnosis not present

## 2022-01-26 DIAGNOSIS — R809 Proteinuria, unspecified: Secondary | ICD-10-CM | POA: Diagnosis not present

## 2022-01-26 DIAGNOSIS — I4891 Unspecified atrial fibrillation: Secondary | ICD-10-CM | POA: Diagnosis not present

## 2022-02-02 ENCOUNTER — Ambulatory Visit: Payer: PPO | Admitting: Cardiovascular Disease

## 2022-02-03 ENCOUNTER — Encounter: Payer: Self-pay | Admitting: Cardiovascular Disease

## 2022-02-03 NOTE — Progress Notes (Signed)
Nathan [name] Date of Birth  1933-12-19       Nathan [name]. [address], [place]    [place], [place]  [address]     [phone]       Fax  [phone]       Problem List: 1. Coronary artery disease-status post CABG 2. Aortic insufficiency 3. Hypercholesterolemia 4. Diabetes mellitus 5. Mitral regurgitation     Nathan [name] is doing very well. He's not had any episodes of chest pain or shortness of breath. 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  Dec. 2, 2014:  Nathan [name] is doing well.  Not as much energy.   No CP or dyspnea.    Dec. 3,2015:  Doing well from a cardiac standpoint.  started taking insulin recently.   Worked again for [name] over the summer and fall.   Dec. 2, 2016:  Doing well from a cardiac standpoint.   BP is high today , was normal yesterday   Feb. 14, 2017:   Nathan [name] is seen back for a visit BP is a bit elevated today .   Eats lots of salty foods ( soup for dinner) He had acute cholecystitis and had a perc drain placed.  The drain has been removed.  He never had his GB removed.  Was placed on home O2 during the hospitalization  And has been on home O2 since He does not think he needs it.   He occasionally will forget to turn it on and cannot tell the difference.  Will check his O2 sate on RA. ( time off oxygen  1440)   During his hospitalization, he had an echocardiogram that reveals normal left ventricle systolic function. He did have mild pulmonary hypertension. He had a stress Myoview study which revealed normal left ventricular systolic function and revealed no ischemia.  Aug. 17, 2017:  Doing well. Had the gall bladder percutaneous drain removed. Did not have GB removed.  Stands on his feet all day , has some leg edema   Feb. 13, 2018:  Doing well .   No further gall bladder issues  Sept.  26, 2018:  Nathan [name] is seen today for follow-up of his coronary artery  disease. Also  has a history of essential hypertension.  May 31, 2017 Doing well . Strawberries  Are ready - hes still working  No CP  No further gall bladder issues  August 29, 2017:  Nathan [name] is seen today  Has been having lots of orthostasis  Lightheadness. Has not been working - works at [name] - in [employer] building  Has been having lots of GI issues ( belching and GERD after eating ) , thinks he is having gall bladder issues.  Appetite has not been good  Labs from [name] office show a creatinine of 2.3  Had 4 of his pills stopped last week  Taking fish oil, amlodipine, ASA and potassium ( by mistake, was supposed to stop)  , MVI  Still eating salty meats   Oct. 21, 2019:  Nathan [name] is seen today  Has not had a good appetite for the past several months  Has a left leg DVT - Is now on Eliquis  orthostasis has improved    September 22, 2018:  Doing well.   No CP , no dyspnea.   BP is a little elevated.   March 26, 2019:  Nathan [name] is seen today for follow-up of his coronary artery disease and aortic insufficiency.  He also has a history of hyperlipidemia. Has retired from [employer].  No CP, Has some dyspnea if he works too quickly .   Feb. 4, 2022: Nathan [name] is seen today for follow up of his CAD, AI, HLD Has had some dizziness , especially when standing  He has occasional episodes of chest heaviness./ dyspnea He may have worseining CAD but we are limited in our ability to do a cath because of his CKD - stage 4.   May 13, 2020: Nathan [name] is seen today for follow up of his CAD, AI,HLD He was in the hospital recently for gram-negative sepsis with acute hypoxic respiratory failure He had + troponins likely due to demand ischemia Peak troponin was 5012  Has CKD  Baseline creatinine is between 2-3.  Just got out of hospital yesterday  WBC is still elevated.   Echocardiogram from May 02, 2020 shows mildly reduced left ventricular systolic function with an  ejection fraction of 40 to 45%.  We were unable to determine diastolic function.  There is mildly elevated pulmonary artery pressures. Mitral valve is degenerative with mild to moderate mitral stenosis. Mild AI  He has severe tricuspid regurgitation.  Was found to have gallstones.  Has a drainage tube in gall bladder   He was told that he was not an operative candidate due to excessive risk.  BP is low today.  He is on Toprol-XL 25 mg a day, isosorbide 30 mg a day, hydralazine 10 mg 3 times a day.  Will DC hydranlazine   Jun 25, 2020: Nathan [name] is seen for follow up of hospitalization for gram negative bacteremia, NSTEMI,  No further CP ,  Breathing is ok .  Still has a poor appetitie    His O2 sat has remained > 90 %.     Nov. 15, 2022 Is having some dizziness Has some dizziness when he pulls his compression hose up  ? Orthostasis Will DC Imdur and see if helps  He stopped the Tamulosin and furosemide last week after he fell .   His dizziness did not improve much   We discussed electrolyte replacement in his water.  Nuun Liquid IV  Protein shakes.   Echo from March, 2022 shows EF 40-45%.  Mild - mod Pulmonary HTN - estimated PA pressure of 40   August 03, 2021 Seen with daughter,  Nathan [name] is seen today for follow up of his CAD, atrial fib  Has had syncope - thought to be due to orthostasis Event monitor showed sinus rhythm ( including tachy and brady) Frequent episodes of SVT up to 179.  Occasional episodes of atrial fib with RVR Rare episodes of nonsustanied VT   His episodes of tachycardia did not seem to correspond to his episodes of dizziness.  Therefore I am not sure that controlling his episodes of nonsustained SVT will prevent his episodes of dizziness or syncope. Is not having much of the tachycardia    Dec. 21, 2023  Nathan [name] is seen for follow up of his CAD,, atrial fib, hx of syncope  Has shortness of breath from time to time No CP   Has PAF on  event monitor  Clinically he is in atrial fib   Has a chronic foley catheter   He may need to have an indwelling catheter/suprapubic catheter placed.  I think that he would be at acceptable risk from a cardiovascular standpoint.  Will be able to hold his Eliquis for 2 to 3 days prior to the procedure.   Current Outpatient Medications on File Prior to Visit  Medication Sig Dispense Refill   albuterol (VENTOLIN HFA) 108 (90 Base) MCG/ACT inhaler Inhale 2 puffs into the lungs every 4 (four) hours as needed for wheezing or shortness of breath.     apixaban (ELIQUIS) 2.5 MG TABS tablet Take 2.5 mg by mouth 2 (two) times daily.     Blood Glucose Monitoring Suppl (ONETOUCH VERIO) w/Device KIT      [employer] D3 25 MCG (1000 UT) capsule Take 1,000 Units by mouth daily.     finasteride (PROSCAR) 5 MG tablet Take 5 mg by mouth daily.     furosemide (LASIX) 80 MG tablet Take 1 tablet (80 mg total) by mouth daily as needed for fluid or edema (if you notice shortness of breath , leg swelling). (Patient taking differently: Take 40 mg by mouth daily as needed for fluid or edema (if you notice shortness of breath , leg swelling).) 30 tablet 0   hydrALAZINE (APRESOLINE) 25 MG tablet Take 1 tablet (25 mg total) by mouth 3 (three) times daily. 90 tablet 0   insulin NPH-regular Human (NOVOLIN 70/30) (70-30) 100 UNIT/ML injection Inject 10-20 Units into the skin See admin instructions. 10 units in the morning & 20 units at dinner Sliding Scale if BS>150     magnesium oxide (MAG-OX) 400 MG tablet Take 400 mg by mouth daily.     megestrol (MEGACE) 20 MG tablet Take 20 mg by mouth daily.     metoprolol succinate (TOPROL XL) 100 MG 24 hr tablet Take 1 tablet (100 mg total) by mouth daily. Take with or immediately following a meal. 30 tablet 11   Multiple Vitamins-Minerals (CENTRUM SILVER PO) Take 1 tablet by mouth daily.     nitroGLYCERIN (NITROSTAT) 0.4 MG SL tablet Place 1 tablet (0.4 mg total) under the tongue every 5  (five) minutes as needed for chest pain. 25 tablet 5   omeprazole (PRILOSEC) 20 MG capsule Take 20 mg by mouth 2 (two) times daily.      ONETOUCH VERIO test strip      potassium chloride SA (KLOR-CON M) 20 MEQ tablet Take 2 tablets (40 mEq total) by mouth daily as needed (take on the day you need to take Furosemide). 30 tablet 0   RELION INSULIN SYRINGE 1ML/31G 31G X 5/16" 1 ML MISC USE 1 TWICE DAILY AS DIRECTED  6   sodium bicarbonate 650 MG tablet Take 650 mg by mouth 2 (two) times daily.     triamcinolone (KENALOG) 0.025 % cream Apply topically 3 (three) times daily.     rosuvastatin (CRESTOR) 40 MG tablet Take 1 tablet (40 mg total) by mouth daily. 30 tablet 2   No current facility-administered medications on file prior to visit.    Allergies  Allergen Reactions   Flomax [Tamsulosin Hcl] Other (See Comments)    Dizzy   Lasix [Furosemide]     Dizziness   Silodosin Other (See Comments)    dizziness    Past Medical History:  Diagnosis Date   Acid reflux    Aortic insufficiency    Cholecystitis    Combined systolic and diastolic heart failure (HCC)    Echo 04/2018: inf-lat HK, EF 40-45, severe basal septal hypertrophy, mild conc LVH, Gr 1 DD, severe LAE, mild to mod TR, mod AI, asc Aorta 39 mm   Coronary artery disease 2002  CABG x5   Diabetes mellitus    DVT (deep venous thrombosis) (HCC)    previously on coumadin   Hypercholesteremia    Hyperlipidemia    Mitral valve regurgitation    Skin cancer    tumor removed off of his back    Past Surgical History:  Procedure Laterality Date   BILIARY DRAINAGE CATHETER PLACEMENT W/ BILE DUCT TUBE CHANGE  01/2015   CORONARY ARTERY BYPASS GRAFT  12/21/2000   x5   CORONARY STENT INTERVENTION N/A 10/02/2021   Procedure: CORONARY STENT INTERVENTION;  Surgeon: Martinique, Peter M, MD;  Location: [facility];  Service: Cardiovascular;  Laterality: N/A;   IR CATHETER TUBE CHANGE  06/18/2020   IR CHOLANGIOGRAM EXISTING TUBE  12/08/2020    IR CHOLANGIOGRAM EXISTING TUBE  12/16/2020   IR EXCHANGE BILIARY DRAIN  06/18/2020   IR EXCHANGE BILIARY DRAIN  08/13/2020   IR EXCHANGE BILIARY DRAIN  10/21/2020   IR EXCHANGE BILIARY DRAIN  11/20/2020   IR PERC CHOLECYSTOSTOMY  05/02/2020   IR RADIOLOGIST EVAL & MGMT  10/24/2020   IR RADIOLOGIST EVAL & MGMT  12/02/2020   IR REMOVAL OF CALCULI/DEBRIS BILIARY DUCT/GB  11/20/2020   IR US GUIDANCE  05/02/2020   LEFT HEART CATH AND CORS/GRAFTS ANGIOGRAPHY N/A 04/06/2018   Procedure: LEFT HEART CATH AND CORS/GRAFTS ANGIOGRAPHY;  Surgeon: Lorretta Harp, MD;  Location: [facility];  Service: Cardiovascular;  Laterality: N/A;   LEFT HEART CATH AND CORS/GRAFTS ANGIOGRAPHY N/A 09/30/2021   Procedure: LEFT HEART CATH AND CORS/GRAFTS ANGIOGRAPHY;  Surgeon: Troy Sine, MD;  Location: [facility];  Service: Cardiovascular;  Laterality: N/A;   SKIN CANCER EXCISION     TUMOR REMOVAL     off of back, skin cancer    Social History   Tobacco Use  Smoking Status Never  Smokeless Tobacco Never    Social History   Substance and Sexual Activity  Alcohol Use No    Family History  Problem Relation Age of Onset   Heart attack Father    Hypertension Mother    Diabetes Brother    Cancer Sister        breast   Cancer Brother        throat & mouth    Reviw of Systems:  Noted in current history, otherwise review of systems is negative.  Physical Exam: Blood pressure 132/68, pulse 90, height _0  (1.803 m), weight 172 lb (78 kg), SpO2 96 %.       GEN:  Well nourished, well developed in no acute distress HEENT: Normal NECK: No JVD; No carotid bruits LYMPHATICS: No lymphadenopathy CARDIAC: irreg. Irreg.  RESPIRATORY:  Clear to auscultation without rales, wheezing or rhonchi  ABDOMEN: Soft, non-tender, non-distended MUSCULOSKELETAL:  No edema; No deformity  SKIN: Warm and dry NEUROLOGIC:  Alert and oriented x 3     ECG:        Assessment / Plan:   1. Coronary artery  disease-    no angina,  continue current meds     2.  Chronic combined systolic / diastolic CHF:     he is able to do most of his daily activities without significant dyspnea.   He does have DOE at times    3.  Left leg DVT :  -   4.  Syncope:    no recent episodes of syncpe    5.  Atrial fibrillation: His heart is somewhat irregular today.  I suspect  he may be in atrial fibrillation currently.  His rate is well-controlled.  Continue Eliquis.  He may need to have a suprapubic catheter placed.  Despite his cardiac issues, I think that he would be at acceptable risk.  We would hold his Eliquis for 2 to 3 days prior to the procedure.  I will see him again in 6 months for follow-up visit.    [name], MD  02/05/2022 6:25 AM    [facility] [place],  [place], [place]  [address] Pager [phone] Phone: [phone]; Fax: [phone]

## 2022-02-04 ENCOUNTER — Ambulatory Visit: Payer: PPO | Attending: Cardiovascular Disease | Admitting: Cardiovascular Disease

## 2022-02-04 ENCOUNTER — Encounter: Payer: Self-pay | Admitting: Cardiovascular Disease

## 2022-02-04 VITALS — BP 132/68 | HR 90 | Ht 71.0 in | Wt 172.0 lb

## 2022-02-04 DIAGNOSIS — I2510 Atherosclerotic heart disease of native coronary artery without angina pectoris: Secondary | ICD-10-CM

## 2022-02-04 DIAGNOSIS — I480 Paroxysmal atrial fibrillation: Secondary | ICD-10-CM

## 2022-02-04 NOTE — Patient Instructions (Signed)
Medication Instructions:  Your physician recommends that you continue on your current medications as directed. Please refer to the Current Medication list given to you today.  *If you need a refill on your cardiac medications before your next appointment, please call your pharmacy*   Lab Work: NONE If you have labs (blood work) drawn today and your tests are completely normal, you will receive your results only by: MyChart Message (if you have MyChart) OR A paper copy in the mail If you have any lab test that is abnormal or we need to change your treatment, we will call you to review the results.   Testing/Procedures: NONE   Follow-Up: At [facility], you and your health needs are our priority.  As part of our continuing mission to provide you with exceptional heart care, we have created designated Provider Care Teams.  These Care Teams include your primary Cardiologist (physician) and Advanced Practice Providers (APPs -  Physician Assistants and Nurse Practitioners) who all work together to provide you with the care you need, when you need it.  We recommend signing up for the patient portal called "MyChart".  Sign up information is provided on this After Visit Summary.  MyChart is used to connect with patients for Virtual Visits (Telemedicine).  Patients are able to view lab/test results, encounter notes, upcoming appointments, etc.  Non-urgent messages can be sent to your provider as well.   To learn more about what you can do with MyChart, go to [email or web address].    Your next appointment:   6 month(s)  The format for your next appointment:   In Person  Provider:   Philip Nahser, MD       Important Information About Sugar       

## 2022-02-12 DIAGNOSIS — R338 Other retention of urine: Secondary | ICD-10-CM | POA: Diagnosis not present

## 2022-02-23 ENCOUNTER — Other Ambulatory Visit (HOSPITAL_COMMUNITY): Payer: Self-pay | Admitting: Urology

## 2022-02-23 DIAGNOSIS — R339 Retention of urine, unspecified: Secondary | ICD-10-CM

## 2022-02-24 ENCOUNTER — Encounter: Payer: Self-pay | Admitting: General Practice

## 2022-02-24 NOTE — Progress Notes (Signed)
Nahser, Wonda Cheng, MD  Allen Kell, NT Yes He may hold his Eliquis 48 hours prior to his suprapubic catheter placement . Restart as soon as it is safe from a bleeding standpoint,  preferably the day after surgery .  Thanks  [name], MD 02/24/2022 2:03 PM   [facility] [place],  [place] [place], [place]  [address] Phone: [phone]; Fax: [phone]          Previous Messages    ----- Message ----- From: Allen Kell, NT Sent: 02/23/2022  10:33 AM EST To: Thayer Headings, MD Subject: CT Supratubic tube placement                  Good Morning The above pt is scheduled to have this procedure done on 03/04/22 and will need to hold his Eliquis for 48 hrs prior. Can we get your permission to hold this blood thinner?  Thanks [facility] Centralized Scheduling

## 2022-03-03 ENCOUNTER — Other Ambulatory Visit: Payer: Self-pay | Admitting: Student

## 2022-03-03 ENCOUNTER — Other Ambulatory Visit: Payer: Self-pay | Admitting: Radiology

## 2022-03-04 ENCOUNTER — Ambulatory Visit (HOSPITAL_COMMUNITY)
Admission: RE | Admit: 2022-03-04 | Discharge: 2022-03-04 | Disposition: A | Discharge status: Home / Self Care | Payer: PPO | Source: Ambulatory Visit | Attending: Urology | Admitting: Urology

## 2022-03-04 ENCOUNTER — Other Ambulatory Visit: Payer: Self-pay

## 2022-03-04 DIAGNOSIS — Z435 Encounter for attention to cystostomy: Secondary | ICD-10-CM | POA: Diagnosis not present

## 2022-03-04 DIAGNOSIS — R339 Retention of urine, unspecified: Secondary | ICD-10-CM | POA: Insufficient documentation

## 2022-03-04 LAB — GLUCOSE, CAPILLARY
Glucose-Capillary: 130 mg/dL — ABNORMAL HIGH (ref 70–99)
Glucose-Capillary: 122 mg/dL — ABNORMAL HIGH (ref 70–99)

## 2022-03-04 MED ORDER — MIDAZOLAM HCL 2 MG/2ML IJ SOLN
INTRAMUSCULAR | Status: AC
  Filled 2022-03-04: qty 2

## 2022-03-04 MED ORDER — SODIUM CHLORIDE 0.9 % IV SOLN: INTRAVENOUS | Status: DC

## 2022-03-04 MED ORDER — FLUMAZENIL 0.5 MG/5ML IV SOLN
INTRAVENOUS | Status: AC
  Filled 2022-03-04: qty 5

## 2022-03-04 MED ORDER — FENTANYL CITRATE (PF) 100 MCG/2ML IJ SOLN
INTRAMUSCULAR | Status: AC
  Filled 2022-03-04: qty 2

## 2022-03-04 MED ORDER — LIDOCAINE HCL 1 % IJ SOLN: 10.0000 mL | Freq: Once | INTRAMUSCULAR | Status: DC

## 2022-03-04 MED ORDER — MIDAZOLAM HCL 2 MG/2ML IJ SOLN
INTRAMUSCULAR | Status: AC | PRN
  Administered 2022-03-04 (×3): .5 mg via INTRAVENOUS

## 2022-03-04 MED ORDER — FENTANYL CITRATE (PF) 100 MCG/2ML IJ SOLN
INTRAMUSCULAR | Status: AC | PRN
  Administered 2022-03-04 (×2): 25 ug via INTRAVENOUS
  Administered 2022-03-04: 50 ug via INTRAVENOUS

## 2022-03-04 MED ORDER — NALOXONE HCL 0.4 MG/ML IJ SOLN
INTRAMUSCULAR | Status: AC
  Filled 2022-03-04: qty 1

## 2022-03-04 NOTE — Procedures (Signed)
Interventional Radiology Procedure Note  Procedure: Image guided supra-pubic catheter placement.  48F pigtail drain.  Complications: None  Recommendations: - Routine drain  - remove foley now - 1 hr dc home - schedule for upsize/foley placement in 4-6 weeks -routine wound care  Signed,  Dulcy Fanny. Earleen Newport, DO

## 2022-03-04 NOTE — Sedation Documentation (Signed)
Bladder filled up with 250cc of fluid

## 2022-03-04 NOTE — H&P (Signed)
Chief Complaint: Patient was seen in consultation today for urinary retention at the request of Wrenn,John  Referring Physician(s): Irine Seal  Supervising Physician: Corrie Mckusick  Patient Status: [facility] - Out-pt  History of Present Illness: Nathan [name] is a 89 y.o. male with extensive cardiac history, chronic urinary retention with current foley catheter, exchanged monthly and CKD.  He understands that his retention is secondary to an enlarged prostate. He has had episodes of foley dysfunction and UTIs.  Urology and nephrology notes are unavailable for review and history is primarily from patient.    He presents today after a 2 day hold of Eliquis and NPO for planned suprapubic tube placement.    Past Medical History:  Diagnosis Date   Acid reflux    Aortic insufficiency    Cholecystitis    Combined systolic and diastolic heart failure ([place])    Echo 04/2018: inf-lat HK, EF 40-45, severe basal septal hypertrophy, mild conc LVH, Gr 1 DD, severe LAE, mild to mod TR, mod AI, asc Aorta 39 mm   Coronary artery disease 2002   CABG x5   Diabetes mellitus    DVT (deep venous thrombosis) (HCC)    previously on coumadin   Hypercholesteremia    Hyperlipidemia    Mitral valve regurgitation    Skin cancer    tumor removed off of his back    Past Surgical History:  Procedure Laterality Date   BILIARY DRAINAGE CATHETER PLACEMENT W/ BILE DUCT TUBE CHANGE  01/2015   CORONARY ARTERY BYPASS GRAFT  12/21/2000   x5   CORONARY STENT INTERVENTION N/A 10/02/2021   Procedure: CORONARY STENT INTERVENTION;  Surgeon: Martinique, Peter M, MD;  Location: [facility];  Service: Cardiovascular;  Laterality: N/A;   IR CATHETER TUBE CHANGE  06/18/2020   IR CHOLANGIOGRAM EXISTING TUBE  12/08/2020   IR CHOLANGIOGRAM EXISTING TUBE  12/16/2020   IR EXCHANGE BILIARY DRAIN  06/18/2020   IR EXCHANGE BILIARY DRAIN  08/13/2020   IR EXCHANGE BILIARY DRAIN  10/21/2020   IR EXCHANGE BILIARY DRAIN  11/20/2020   IR  PERC CHOLECYSTOSTOMY  05/02/2020   IR RADIOLOGIST EVAL & MGMT  10/24/2020   IR RADIOLOGIST EVAL & MGMT  12/02/2020   IR REMOVAL OF CALCULI/DEBRIS BILIARY DUCT/GB  11/20/2020   IR US GUIDANCE  05/02/2020   LEFT HEART CATH AND CORS/GRAFTS ANGIOGRAPHY N/A 04/06/2018   Procedure: LEFT HEART CATH AND CORS/GRAFTS ANGIOGRAPHY;  Surgeon: Lorretta Harp, MD;  Location: [place] [facility];  Service: Cardiovascular;  Laterality: N/A;   LEFT HEART CATH AND CORS/GRAFTS ANGIOGRAPHY N/A 09/30/2021   Procedure: LEFT HEART CATH AND CORS/GRAFTS ANGIOGRAPHY;  Surgeon: Troy Sine, MD;  Location: [place] [facility];  Service: Cardiovascular;  Laterality: N/A;   SKIN CANCER EXCISION     TUMOR REMOVAL     off of back, skin cancer    Allergies: Flomax [tamsulosin hcl], Lasix [furosemide], and Silodosin  Medications: Prior to Admission medications   Medication Sig Start Date End Date Taking? Authorizing Provider  albuterol (VENTOLIN HFA) 108 (90 Base) MCG/ACT inhaler Inhale 2 puffs into the lungs every 4 (four) hours as needed for wheezing or shortness of breath. 11/13/19   [provider]  apixaban (ELIQUIS) 2.5 MG TABS tablet Take 2.5 mg by mouth 2 (two) times daily.    [provider]  Blood Glucose Monitoring Suppl (ONETOUCH VERIO) w/Device KIT  09/06/17   [provider]  CV[facility] D3 25 MCG (1000 UT) capsule Take 1,000  Units by mouth daily. 11/11/21   [provider]  finasteride (PROSCAR) 5 MG tablet Take 5 mg by mouth daily.    [provider]  furosemide (LASIX) 80 MG tablet Take 1 tablet (80 mg total) by mouth daily as needed for fluid or edema (if you notice shortness of breath , leg swelling). Patient taking differently: Take 40 mg by mouth daily as needed for fluid or edema (if you notice shortness of breath , leg swelling). 10/05/21   Barb Merino, MD  hydrALAZINE (APRESOLINE) 25 MG tablet Take 1 tablet (25 mg total) by mouth 3 (three) times daily. 10/14/21  10/14/22  Lyda Jester M, PA-C  insulin NPH-regular Human (NOVOLIN 70/30) (70-30) 100 UNIT/ML injection Inject 10-20 Units into the skin See admin instructions. 10 units in the morning & 20 units at dinner Sliding Scale if BS>150    [provider]  magnesium oxide (MAG-OX) 400 MG tablet Take 400 mg by mouth daily. 04/06/21   [provider]  megestrol (MEGACE) 20 MG tablet Take 20 mg by mouth daily. 08/01/20   [provider]  metoprolol succinate (TOPROL XL) 100 MG 24 hr tablet Take 1 tablet (100 mg total) by mouth daily. Take with or immediately following a meal. 10/05/21 10/05/22  Barb Merino, MD  Multiple Vitamins-Minerals (CENTRUM SILVER PO) Take 1 tablet by mouth daily.    [provider]  nitroGLYCERIN (NITROSTAT) 0.4 MG SL tablet Place 1 tablet (0.4 mg total) under the tongue every 5 (five) minutes as needed for chest pain. 04/04/18   Richardson Dopp T, PA-C  omeprazole (PRILOSEC) 20 MG capsule Take 20 mg by mouth 2 (two) times daily.  01/06/11   [provider]  ONETOUCH VERIO test strip  09/06/17   [provider]  potassium chloride SA (KLOR-CON M) 20 MEQ tablet Take 2 tablets (40 mEq total) by mouth daily as needed (take on the day you need to take Furosemide). 10/05/21   Barb Merino, MD  RELION INSULIN SYRINGE 1ML/31G 31G X 5/16" 1 ML MISC USE 1 TWICE DAILY AS DIRECTED 09/29/17   [provider]  rosuvastatin (CRESTOR) 40 MG tablet Take 1 tablet (40 mg total) by mouth daily. 10/06/21 01/04/22  Barb Merino, MD  sodium bicarbonate 650 MG tablet Take 650 mg by mouth 2 (two) times daily. 04/06/21   [provider]  triamcinolone (KENALOG) 0.025 % cream Apply topically 3 (three) times daily. 10/21/21   [provider]     Family History  Problem Relation Age of Onset   Heart attack Father    Hypertension Mother    Diabetes Brother    Cancer Sister        breast   Cancer Brother        throat & mouth     Social History   Socioeconomic History   Marital status: Divorced    Spouse name: Not on file   Number of children: 1   Years of education: Not on file   Highest education level: High school graduate  Occupational History   Occupation: Retired  Tobacco Use   Smoking status: Never   Smokeless tobacco: Never  Vaping Use   Vaping Use: Never used  Substance and Sexual Activity   Alcohol use: No   Drug use: No   Sexual activity: Not Currently  Other Topics Concern   Not on file  Social History Narrative   Not on file   Social Determinants of Health   Financial  Resource Strain: Low Risk  (10/02/2021)   Overall Financial Resource Strain (CARDIA)    Difficulty of Paying Living Expenses: Not hard at all  Food Insecurity: No Food Insecurity (10/02/2021)   Hunger Vital Sign    Worried About Running Out of Food in the Last Year: Never true    Ran Out of Food in the Last Year: Never true  Transportation Needs: No Transportation Needs (10/02/2021)   PRAPARE - Hydrologist (Medical): No    Lack of Transportation (Non-Medical): No  Physical Activity: Not on file  Stress: Not on file  Social Connections: Not on file    Review of Systems  Constitutional:  Negative for chills and fever.  Eyes:  Negative for visual disturbance.  Respiratory:  Positive for cough. Negative for chest tightness and shortness of breath.   Cardiovascular:  Negative for chest pain and palpitations.  Gastrointestinal:  Negative for abdominal distention, abdominal pain, constipation, nausea and vomiting.  Genitourinary:  Positive for difficulty urinating.  Neurological:  Negative for headaches.  Hematological:  Bruises/bleeds easily.  Psychiatric/Behavioral: Negative.      Vital Signs: BP (!) 172/69   Pulse 67   Temp 98.3 F (36.8 C) (Oral)   Resp 16   Ht 5' 11.5" (1.816 m)   Wt 173 lb (78.5 kg)   SpO2 92%   BMI 23.79 kg/m   Physical Exam Constitutional:       General: He is not in acute distress.    Comments: Chronically ill appearing  HENT:     Mouth/Throat:     Mouth: Mucous membranes are dry.     Pharynx: Oropharynx is clear.  Eyes:     General: No scleral icterus.    Extraocular Movements: Extraocular movements intact.  Cardiovascular:     Rate and Rhythm: Normal rate.     Pulses: Normal pulses.  Pulmonary:     Effort: Pulmonary effort is normal. No respiratory distress.  Abdominal:     General: Abdomen is flat. There is no distension.     Palpations: Abdomen is soft.     Tenderness: There is no abdominal tenderness.  Genitourinary:    Comments: Indwelling foley catheter is clamped Skin:    General: Skin is dry.     Coloration: Skin is pale.  Neurological:     General: No focal deficit present.     Mental Status: He is alert and oriented to person, place, and time.  Psychiatric:        Mood and Affect: Mood normal.        Behavior: Behavior normal.   Imaging: No results found.  Labs:  CBC: Recent Labs    10/02/21 0055 10/02/21 1941 10/03/21 0606 10/04/21 0614 10/05/21 0742  WBC 9.0  --  9.7 9.0 11.9*  HGB 12.6* 13.0 11.5* 10.4* 11.6*  HCT 37.6* 38.7* 34.4* 30.4* 34.0*  PLT 216  --  218 174 232    COAGS: Recent Labs    09/29/21 0750 09/29/21 1800 09/30/21 0316  APTT 51* 60* 82*    BMP: Recent Labs    10/02/21 0055 10/03/21 0606 10/04/21 0614 10/05/21 0742  NA 137 138 137 138  K 4.3 4.2 3.9 3.4*  CL 110 113* 111 108  CO2 20* 15* 18* 20*  GLUCOSE 119* 114* 134* 129*  BUN 37* 30* 39* 43*  CALCIUM 8.5* 8.5* 8.4* 9.1  CREATININE 2.38* 2.05* 2.30* 2.33*  GFRNONAA 26* 31* 27* 27*    LIVER FUNCTION  TESTS: Recent Labs    03/12/21 0736 03/27/21 1253 09/28/21 1627  BILITOT 1.3* 0.4 0.9  AST 35 35 51*  ALT '22 22 28  '$ ALKPHOS 42 54 42  PROT 7.3 7.0 6.8  ALBUMIN 3.4* 3.2* 3.2*    Assessment and Plan:  Nathan [name] is a pleasant 89 year old male with extensive cardiac history and chronic  urinary retention secondary to an enlarged prostate.  Due to foley dysfunction and multiple infections, a suprapubic tube has been requested.  He presents appropriately NPO and having held Eliquis for 48 hours.  Labs, vitals, history, and medication reviewed.  Will proceed with planned suprapubic catheter placement and anticipated discharge later today. He may resume his Eliquis tomorrow 03/05/22.   An appointment will be made for exchange and upsize in 4-6 weeks.  Risks and benefits discussed with the patient including bleeding, infection, damage to adjacent structures, bowel perforation/fistula connection, and sepsis.  All of the patient's questions were answered, patient is agreeable to proceed. Consent signed and in chart.     Thank you for this interesting consult.  I greatly enjoyed meeting [facility] and look forward to participating in their care.  A copy of this report was sent to the requesting provider on this date.  Electronically Signed: Pasty Spillers, PA 03/04/2022, 9:39 AM   I spent a total of 30 Minutes  in face to face in clinical consultation, greater than 50% of which was counseling/coordinating care for urinary retention

## 2022-03-08 ENCOUNTER — Other Ambulatory Visit: Payer: Self-pay | Admitting: Interventional Radiology

## 2022-03-08 DIAGNOSIS — Z794 Long term (current) use of insulin: Secondary | ICD-10-CM | POA: Diagnosis not present

## 2022-03-08 DIAGNOSIS — I480 Paroxysmal atrial fibrillation: Secondary | ICD-10-CM | POA: Diagnosis not present

## 2022-03-08 DIAGNOSIS — I25709 Atherosclerosis of coronary artery bypass graft(s), unspecified, with unspecified angina pectoris: Secondary | ICD-10-CM | POA: Diagnosis not present

## 2022-03-08 DIAGNOSIS — I25810 Atherosclerosis of coronary artery bypass graft(s) without angina pectoris: Secondary | ICD-10-CM | POA: Diagnosis not present

## 2022-03-08 DIAGNOSIS — N1832 Chronic kidney disease, stage 3b: Secondary | ICD-10-CM | POA: Diagnosis not present

## 2022-03-08 DIAGNOSIS — R339 Retention of urine, unspecified: Secondary | ICD-10-CM

## 2022-03-08 DIAGNOSIS — R300 Dysuria: Secondary | ICD-10-CM | POA: Diagnosis not present

## 2022-03-08 DIAGNOSIS — N320 Bladder-neck obstruction: Secondary | ICD-10-CM | POA: Diagnosis not present

## 2022-03-08 DIAGNOSIS — I351 Nonrheumatic aortic (valve) insufficiency: Secondary | ICD-10-CM | POA: Diagnosis not present

## 2022-03-08 DIAGNOSIS — I130 Hypertensive heart and chronic kidney disease with heart failure and stage 1 through stage 4 chronic kidney disease, or unspecified chronic kidney disease: Secondary | ICD-10-CM | POA: Diagnosis not present

## 2022-03-08 DIAGNOSIS — E7800 Pure hypercholesterolemia, unspecified: Secondary | ICD-10-CM | POA: Diagnosis not present

## 2022-03-08 DIAGNOSIS — E1121 Type 2 diabetes mellitus with diabetic nephropathy: Secondary | ICD-10-CM | POA: Diagnosis not present

## 2022-03-08 DIAGNOSIS — I5042 Chronic combined systolic (congestive) and diastolic (congestive) heart failure: Secondary | ICD-10-CM | POA: Diagnosis not present

## 2022-03-11 ENCOUNTER — Other Ambulatory Visit (HOSPITAL_COMMUNITY): Payer: Self-pay | Admitting: Physician Assistant

## 2022-03-11 DIAGNOSIS — R339 Retention of urine, unspecified: Secondary | ICD-10-CM

## 2022-03-12 ENCOUNTER — Ambulatory Visit (HOSPITAL_COMMUNITY)
Admission: RE | Admit: 2022-03-12 | Discharge: 2022-03-12 | Disposition: A | Discharge status: Home / Self Care | Payer: PPO | Source: Ambulatory Visit | Attending: Physician Assistant | Admitting: Physician Assistant

## 2022-03-12 DIAGNOSIS — R339 Retention of urine, unspecified: Secondary | ICD-10-CM

## 2022-03-12 NOTE — Progress Notes (Incomplete)
Nathan [name] is here today for leaking foley bag.  Urine retention bag changed and taped without incident.

## 2022-03-16 ENCOUNTER — Telehealth: Payer: Self-pay | Admitting: Podiatry

## 2022-03-16 NOTE — Telephone Encounter (Signed)
Preauth denied for retro claim because claim has already posted and been denied.

## 2022-03-23 DIAGNOSIS — D23112 Other benign neoplasm of skin of right lower eyelid, including canthus: Secondary | ICD-10-CM | POA: Diagnosis not present

## 2022-03-23 DIAGNOSIS — C44329 Squamous cell carcinoma of skin of other parts of face: Secondary | ICD-10-CM | POA: Diagnosis not present

## 2022-03-23 DIAGNOSIS — D485 Neoplasm of uncertain behavior of skin: Secondary | ICD-10-CM | POA: Diagnosis not present

## 2022-03-23 DIAGNOSIS — B079 Viral wart, unspecified: Secondary | ICD-10-CM | POA: Diagnosis not present

## 2022-03-24 ENCOUNTER — Telehealth: Payer: Self-pay | Admitting: Cardiovascular Disease

## 2022-03-24 NOTE — Telephone Encounter (Signed)
*  STAT* If patient is at the pharmacy, call can be transferred to refill team.   1. Which medications need to be refilled? (please list name of each medication and dose if known)   furosemide (LASIX) 80 MG tablet   2. Which pharmacy/location (including street and city if local pharmacy) is medication to be sent to?  [id number]- [place], [place] - [id number]   3. Do they need a 30 day or 90 day supply?   30 day  Daughter stated patient started taking this medication when he was in the hospital and only has 4 tablets left.

## 2022-03-25 MED ORDER — FUROSEMIDE 80 MG PO TABS: 80.0000 mg | ORAL_TABLET | Freq: Every day | ORAL | 11 refills | Status: DC | PRN

## 2022-03-25 NOTE — Telephone Encounter (Signed)
Pt's medication was sent to pt's pharmacy as requested. Confirmation received.  °

## 2022-03-26 DIAGNOSIS — M7989 Other specified soft tissue disorders: Secondary | ICD-10-CM | POA: Diagnosis not present

## 2022-03-26 DIAGNOSIS — M25532 Pain in left wrist: Secondary | ICD-10-CM | POA: Diagnosis not present

## 2022-03-26 DIAGNOSIS — I10 Essential (primary) hypertension: Secondary | ICD-10-CM | POA: Diagnosis not present

## 2022-04-01 ENCOUNTER — Other Ambulatory Visit: Payer: Self-pay | Admitting: Radiology

## 2022-04-02 ENCOUNTER — Other Ambulatory Visit (HOSPITAL_COMMUNITY): Payer: Self-pay | Admitting: Physician Assistant

## 2022-04-02 ENCOUNTER — Ambulatory Visit
Admission: RE | Admit: 2022-04-02 | Discharge: 2022-04-02 | Disposition: A | Discharge status: Home / Self Care | Payer: PPO | Source: Ambulatory Visit | Attending: Interventional Radiology | Admitting: Interventional Radiology

## 2022-04-02 DIAGNOSIS — N401 Enlarged prostate with lower urinary tract symptoms: Secondary | ICD-10-CM | POA: Diagnosis not present

## 2022-04-02 DIAGNOSIS — R339 Retention of urine, unspecified: Secondary | ICD-10-CM

## 2022-04-02 HISTORY — PX: IR RADIOLOGIST EVAL & MGMT: IMG5224

## 2022-04-02 NOTE — Consult Note (Signed)
Chief Complaint: LUTS/BPH  Referring Physician(s): Hayley Boisseau  History of Present Illness: Nathan [name] is a 89 y.o. male presenting today to [place] clinic as scheduled appointment, for discussion regarding his lower urinary tract symptoms and BPH.    Nathan [name] is here today with his daughter for the interview.   We met Nathan [name] 03/04/22 at [facility] when he arrived for image guided placement of supra-pubic catheter, as solution for obstructing urinary symptoms.  Dr. Jeffie Pollock has been treating him for a long time.  Dr. Ralene Muskrat notes indicate that he has had trouble with post-void residual in the 200-500cc range, also with history of several UTI's secondary to retention, and also hematuria.  A foley was placed January of 2023 for retention with a residual of >900cc.    These symptoms have been refractory to medical therapy, including trials of 5-alpha-reductase inhibitors and alpha blockers. He has also had some trouble with hypotension as side-effect.   He has failed voiding trials in September 2023.  Ultimately, we placed the supra-pubic catheter 03/04/22.  I discussed today's referral with Dr. Jeffie Pollock at that time.  He confirmed that he is not a good surgical candidate for traditional surgery.    Both Nathan [name] and his daughter tell me today that he has had the most trouble with the new leg bag, which does not fit comfortably beneath his clothes.  Also, he says that he has had decreased urine output, emptying the bag once or twice daily.  He says he drinks typically 2 glasses of water daily, and 2 cups of coffee.  He denies any leakage of urine at the stoma site onto his clothes.   He does have a history of CKD.  His last creatinine was in December in the available records, 2.1, with estimated GFR of <30.  His nephrologist is Dr. Royce Macadamia.   Based on prior CT scans, I estimate that his prostate is >130cc.  He has ~91m of intra-vesicle protrusion.      He denies any resting chest pain.  He says  he does get somewhat SOB when ambulating.  He is taking anticoagulation for Afib.  This was held for his SP tube placement. He has history of CHF, CAD, DM, HLD.    Past Medical History:  Diagnosis Date   Acid reflux    Aortic insufficiency    Cholecystitis    Combined systolic and diastolic heart failure ([place]    Echo 04/2018: inf-lat HK, EF 40-45, severe basal septal hypertrophy, mild conc LVH, Gr 1 DD, severe LAE, mild to mod TR, mod AI, asc Aorta 39 mm   Coronary artery disease 2002   CABG x5   Diabetes mellitus    DVT (deep venous thrombosis) (HCC)    previously on coumadin   Hypercholesteremia    Hyperlipidemia    Mitral valve regurgitation    Skin cancer    tumor removed off of his back    Past Surgical History:  Procedure Laterality Date   BILIARY DRAINAGE CATHETER PLACEMENT W/ BILE DUCT TUBE CHANGE  01/2015   CORONARY ARTERY BYPASS GRAFT  12/21/2000   x5   CORONARY STENT INTERVENTION N/A 10/02/2021   Procedure: CORONARY STENT INTERVENTION;  Surgeon: JMartinique Peter M, MD;  [facility];  Service: Cardiovascular;  Laterality: N/A;   IR CATHETER TUBE CHANGE  06/18/2020   IR CHOLANGIOGRAM EXISTING TUBE  12/08/2020   IR CHOLANGIOGRAM EXISTING TUBE  12/16/2020   IR EXCHANGE BILIARY DRAIN  06/18/2020  IR EXCHANGE BILIARY DRAIN  08/13/2020   IR EXCHANGE BILIARY DRAIN  10/21/2020   IR EXCHANGE BILIARY DRAIN  11/20/2020   IR PERC CHOLECYSTOSTOMY  05/02/2020   IR RADIOLOGIST EVAL & MGMT  10/24/2020   IR RADIOLOGIST EVAL & MGMT  12/02/2020   IR REMOVAL OF CALCULI/DEBRIS BILIARY DUCT/GB  11/20/2020   IR US GUIDANCE  05/02/2020   LEFT HEART CATH AND CORS/GRAFTS ANGIOGRAPHY N/A 04/06/2018   Procedure: LEFT HEART CATH AND CORS/GRAFTS ANGIOGRAPHY;  Surgeon: Lorretta Harp, MD;  Location: [facility];  Service: Cardiovascular;  Laterality: N/A;   LEFT HEART CATH AND CORS/GRAFTS ANGIOGRAPHY N/A 09/30/2021   Procedure: LEFT HEART CATH AND CORS/GRAFTS ANGIOGRAPHY;  Surgeon:  Troy Sine, MD;  Location: [facility];  Service: Cardiovascular;  Laterality: N/A;   SKIN CANCER EXCISION     TUMOR REMOVAL     off of back, skin cancer    Allergies: Flomax [tamsulosin hcl], Lasix [furosemide], and Silodosin  Medications: Prior to Admission medications   Medication Sig Start Date End Date Taking? Authorizing Provider  albuterol (VENTOLIN HFA) 108 (90 Base) MCG/ACT inhaler Inhale 2 puffs into the lungs every 4 (four) hours as needed for wheezing or shortness of breath. 11/13/19   [provider]  apixaban (ELIQUIS) 2.5 MG TABS tablet Take 2.5 mg by mouth 2 (two) times daily.    [provider]  Blood Glucose Monitoring Suppl (ONETOUCH VERIO) w/Device KIT  09/06/17   [provider]  CVS D3 25 MCG (1000 UT) capsule Take 1,000 Units by mouth daily. 11/11/21   [provider]  finasteride (PROSCAR) 5 MG tablet Take 5 mg by mouth daily.    [provider]  furosemide (LASIX) 80 MG tablet Take 1 tablet (80 mg total) by mouth daily as needed for fluid or edema (if you notice shortness of breath , leg swelling). 03/25/22   Nahser, Wonda Cheng, MD  hydrALAZINE (APRESOLINE) 25 MG tablet Take 1 tablet (25 mg total) by mouth 3 (three) times daily. 10/14/21 10/14/22  Lyda Jester M, PA-C  insulin NPH-regular Human (NOVOLIN 70/30) (70-30) 100 UNIT/ML injection Inject 10-20 Units into the skin See admin instructions. 10 units in the morning & 20 units at dinner Sliding Scale if BS>150    [provider]  magnesium oxide (MAG-OX) 400 MG tablet Take 400 mg by mouth daily. 04/06/21   [provider]  megestrol (MEGACE) 20 MG tablet Take 20 mg by mouth daily. 08/01/20   [provider]  metoprolol succinate (TOPROL XL) 100 MG 24 hr tablet Take 1 tablet (100 mg total) by mouth daily. Take with or immediately following a meal. 10/05/21 10/05/22  Barb Merino, MD  Multiple Vitamins-Minerals (CENTRUM SILVER PO) Take 1  tablet by mouth daily.    [provider]  nitroGLYCERIN (NITROSTAT) 0.4 MG SL tablet Place 1 tablet (0.4 mg total) under the tongue every 5 (five) minutes as needed for chest pain. 04/04/18   Richardson Dopp T, PA-C  omeprazole (PRILOSEC) 20 MG capsule Take 20 mg by mouth 2 (two) times daily.  01/06/11   [provider]  ONETOUCH VERIO test strip  09/06/17   [provider]  potassium chloride SA (KLOR-CON M) 20 MEQ tablet Take 2 tablets (40 mEq total) by mouth daily as needed (take on the day you need to take Furosemide). 10/05/21   Barb Merino, MD  RELION INSULIN SYRINGE 1ML/31G 31G X 5/16" 1 ML MISC USE 1 TWICE  DAILY AS DIRECTED 09/29/17   [provider]  rosuvastatin (CRESTOR) 40 MG tablet Take 1 tablet (40 mg total) by mouth daily. 10/06/21 03/04/22  Barb Merino, MD  sodium bicarbonate 650 MG tablet Take 650 mg by mouth 2 (two) times daily. 04/06/21   [provider]  triamcinolone (KENALOG) 0.025 % cream Apply topically 3 (three) times daily. 10/21/21   [provider]     Family History  Problem Relation Age of Onset   Heart attack Father    Hypertension Mother    Diabetes Brother    Cancer Sister        breast   Cancer Brother        throat & mouth    Social History   Socioeconomic History   Marital status: Divorced    Spouse name: Not on file   Number of children: 1   Years of education: Not on file   Highest education level: High school graduate  Occupational History   Occupation: Retired  Tobacco Use   Smoking status: Never   Smokeless tobacco: Never  Vaping Use   Vaping Use: Never used  Substance and Sexual Activity   Alcohol use: No   Drug use: No   Sexual activity: Not Currently  Other Topics Concern   Not on file  Social History Narrative   Not on file   Social Determinants of Health   Financial Resource Strain: Low Risk  (10/02/2021)   Overall Financial Resource Strain (CARDIA)    Difficulty of Paying  Living Expenses: Not hard at all  Food Insecurity: No Food Insecurity (10/02/2021)   Hunger Vital Sign    Worried About Running Out of Food in the Last Year: Never true    [place] in the Last Year: Never true  Transportation Needs: No Transportation Needs (10/02/2021)   PRAPARE - Hydrologist (Medical): No    Lack of Transportation (Non-Medical): No  Physical Activity: Not on file  Stress: Not on file  Social Connections: Not on file       Review of Systems: A 12 point ROS discussed and pertinent positives are indicated in the HPI above.  All other systems are negative.  Review of Systems  Vital Signs: BP (!) 187/85 (BP Location: Left Arm, Patient Position: Sitting, Cuff Size: Normal)   Pulse 88   Temp 97.8 F (36.6 C) (Oral)   Wt 78.5 kg   SpO2 96% Comment: room air  BMI 23.79 kg/m   Advance Care Plan: The advanced care plan/surrogate decision maker was discussed at the time of visit and documented in the medical record.    Physical Exam General: 89 yo male appearing stated age.  Well-developed, well-nourished.  No distress. HEENT: Glasses.  Conjugate gaze, extra-ocular motor intact. No scleral icterus or scleral injection.  Oral mucosa moist, pink.  Neck: Symmetric with no goiter enlargement.  Chest/Lungs:  Symmetric chest with inspiration/expiration.  No labored breathing.  Clear to auscultation with no wheezes, rhonchi, or rales.  Heart:  Irregular. No third heart sounds appreciated. No JVD appreciated.  Abdomen:  Soft, NT/ND, with + bowel sounds.  Supra-pubic tube site is healing.  No concern for infection.  27F drain tube.  Neurologic: Alert & Oriented to person, place, and time.   Normal affect and insight.   Pulse Exam:  No bruit appreciated.  Palpable radial pulses.  Extremities: No swelling. He has some bruising/ecchymosis of the bilateral upper extremities.  Mallampati Score:     Imaging: CT GUIDED  PERITONEAL/RETROPERITONEAL FLUID DRAIN BY PERC CATH  Result Date: 03/04/2022 INDICATION: 89 year old male referred for suprapubic catheter. EXAM: IMAGE GUIDED SUPRAPUBIC CATHETER COMPARISON:  CT 10/02/2021 MEDICATIONS: None ANESTHESIA/SEDATION: Moderate (conscious) sedation was employed during this procedure. A total of Versed 1.5 mg and Fentanyl 100 mcg was administered intravenously by the radiology nurse. Total intra-service moderate Sedation Time: 28 minutes. The patient's level of consciousness and vital signs were monitored continuously by radiology nursing throughout the procedure under my direct supervision. CONTRAST:  None FLUOROSCOPY: CT COMPLICATIONS: None PROCEDURE: Informed written consent was obtained from the patient after a thorough discussion of the procedural risks, benefits and alternatives. All questions were addressed. Maximal Sterile Barrier Technique was utilized including caps, mask, sterile gowns, sterile gloves, sterile drape, hand hygiene and skin antiseptic. A timeout was performed prior to the initiation of the procedure. Patient position in the supine position on the CT table. The suprapubic region was prepped with chlorhexidine in a sterile fashion, and a sterile drape was applied covering the operative field. A sterile gown and sterile gloves were used for the procedure. Local anesthesia was provided with 1% Lidocaine. Indwelling Foley catheter had been clamped prior to the procedure. CT image was acquired. Retrograde infusion of sterile saline was required for distension of the urinary bladder. Repeat CT was performed. Once we determined angle of approach to the urinary bladder, the skin and subcutaneous tissues were then generously infiltrated with 1% lidocaine to the level of the anterior wall of the bladder for local anesthesia. A small stab incision was made in the skin, and a Yueh needle was advanced into the urinary bladder. Using modified Seldinger technique, a 14 French  pigtail catheter was placed into the urinary bladder with the pigtail catheter locked. Spontaneous urine drained through the tube confirming position. Final image was stored. Catheter was sutured in position.  Foley catheter was removed. Patient tolerated the procedure well and remained hemodynamically stable throughout. No complications were encountered and no significant blood loss was encountered. IMPRESSION: Status post CT-guided suprapubic catheter placement. Signed, Dulcy Fanny. Nadene Rubins, RPVI [facility] Radiology Electronically Signed   By: Corrie Mckusick D.O.   On: 03/04/2022 11:10    Labs:  CBC: Recent Labs    10/02/21 0055 10/02/21 1941 10/03/21 0606 10/04/21 0614 10/05/21 0742  WBC 9.0  --  9.7 9.0 11.9*  HGB 12.6* 13.0 11.5* 10.4* 11.6*  HCT 37.6* 38.7* 34.4* 30.4* 34.0*  PLT 216  --  218 174 232    COAGS: Recent Labs    09/29/21 0750 09/29/21 1800 09/30/21 0316  APTT 51* 60* 82*    BMP: Recent Labs    10/02/21 0055 10/03/21 0606 10/04/21 0614 10/05/21 0742  NA 137 138 137 138  K 4.3 4.2 3.9 3.4*  CL 110 113* 111 108  CO2 20* 15* 18* 20*  GLUCOSE 119* 114* 134* 129*  BUN 37* 30* 39* 43*  CALCIUM 8.5* 8.5* 8.4* 9.1  CREATININE 2.38* 2.05* 2.30* 2.33*  GFRNONAA 26* 31* 27* 27*    LIVER FUNCTION TESTS: Recent Labs    09/28/21 1627  BILITOT 0.9  AST 51*  ALT 28  ALKPHOS 42  PROT 6.8  ALBUMIN 3.2*    TUMOR MARKERS: No results for input(s): "AFPTM", "CEA", "CA199", "CHROMGRNA" in the last 8760 hours.  Assessment and Plan:  Nathan Orndoff is an 89 yo male, presenting with severe symptoms of lower urinary tract  symptoms secondary to BPH, life-style limiting.   He has obstructing symptoms of LUTS refractory to medical treatment, with 3 indications to consider intervention; gross hematuria, obstruction with need for drainage/foley, and recurrent UTI.    His estimated prostate size is >130g, in the 'very  large prostate' category.  Additionally he has about 58m of intravesical protrusion.     I had a lengthy discussion with Nathan BGaydosand his daughter today regarding the relevant anatomy of the prostate, the ubiquitous enlargement of the prostate over time/BPH (70% of 89yo), the resulting LUTS that can be attributable to the BPH, and scope of the issue, generally accepted to be about 25% of aging men with moderate/severe LUTS and decreased QoL.     We discussed his failure of medical therapy for treatment, and the 3 indications that he has for intervention.  They understand his candidacy, and also understand that he is considered to be a poor surgical candidate for open prostatectomy/robotic surgery.     Regarding PAE, we went over the expanding experience over the past ~25years, difference between the surgical goals (tissue removal or replacement) and the endovascular goals (decreased perfusion and shrinkage/ischemia), and the results of our limited randomized controlled trials and other meta-analysis.  I shared with them that PAE has been compared to TURP regarding both quantifiable measures such as PVR and flow, as well as qualitative measures such as QoL and IPSS.  Overall, TURP performs better from magnitude of results, but both TURP and PAE result in a significant improvement of all symptoms.  Generally, PAE has been shown to improve the IPSS score reliably with a 30-50% reduction.     We also discussed the side effect profile and the risks.  Risks specific to PAE include bleeding, infection, arterial injury, contrast reaction, acute kidney injury, post-embolization syndrome, need for further surgery/procedure, non-target embolization (specifically rectum/colon, bladder, corpus), anesthesia/sedation effects, cardiopulmonary collapse, death.  Regarding the specific concern of non-target embolization of the pudendal artery/penis, while this is theoretically a risk, all of our meta-analysis show that this  complication has not been reported.   I did let them know that there is also a typical prodromic syndrome after embolization, "post-embolization syndrome" that is to be expected; pain, dysuria, low-grade fever, malaise.  This can sometimes lead to acute urinary retention, which for him would not be a problem given the SP tube.    One of the big concerns for him is his underlying kidney disease, and the need for contrast for the embolization.  We will discuss this with his nephrologist.   After our discussion, our plan is to have him continue with his SP tube upsize next week, and we will discuss with Nephrology.  After a bit more time of experiencing the SP tube and how problematic this might be, we can circle back to entertain the idea of possible PAE.       Plan: - Continue with supra-pubic drain upsize next week, with ultimate goal of 11F foley balloon retention.  -  We will get Nephrology opinion on the risk for CIN in the setting of PAE with CKD.  - We will set up another discussion, perhaps by telephone in ~3 months from now to reassess his desire for possible PAE.  - Continue current care.    _________________________________________________   MAnnamarie Major Bilhim TA, [place] FC, BJoycie Peek BWilliemae Natter Sapoval Nathan, GWinn Jock SBrimson MEctorTD, Kava BR, [place] SHeidlersburgT, M[place] Tam [place] Society  of Interventional Radiology Multisociety Consensus Position Statement on Prostatic Artery Embolization for Treatment of Lower Urinary Tract Symptoms Attributed to Benign Prostatic Hyperplasia: From the Society of Interventional Radiology, the Cardiovascular and Interventional Radiological Society of [place], [place], and the [employer] of Interventional Radiology: Endorsed by the [employer] of Cardiovascular and Interventional Radiology, Merchant navy officer for Interventional Radiology, Secretary/administrator, Interventional  Radiology Society of [place], [place] of Interventional Radiology, and [place] of Interventional Radiology. J Vasc Interv Radiol. 2019 May;30(5):627-637.e1. doi: 10.1016/[email or web address].[id number]. Epub 2019 Mar 27. PMID: [id number].    Grace Blight, McVary KT, Eveline Keto, Bixler BR, Dahm P, Cruz Condon, [place] MC, [address] [place] TS, Cherylann Ratel JK, [place] CG, [place], [place], [place]. Management of Lower Urinary Tract Symptoms Attributed to Benign Prostatic Hyperplasia: AUA GUIDELINE PART I-Initial Work-up and Medical Management. J Urol. 2021 Oct;206(4):806-817. doi: 10.1097/[id number]. Epub 2021 Aug 13. Erratum in: J Urol. 2021 Nov;206(5):1339. PMID: [id number].   Thank you for this interesting consult.  I greatly enjoyed meeting [facility] and look forward to participating in their care.  A copy of this report was sent to the requesting provider on this date.  Electronically Signed: Corrie Mckusick 04/02/2022, 12:14 PM   I spent a total of  60 Minutes   in face to face in clinical consultation, greater than 50% of which was counseling/coordinating care for BPH/LUTS, possible prostate artery embolization

## 2022-04-05 ENCOUNTER — Encounter (HOSPITAL_COMMUNITY): Payer: Self-pay

## 2022-04-05 ENCOUNTER — Other Ambulatory Visit: Payer: Self-pay

## 2022-04-05 ENCOUNTER — Ambulatory Visit (HOSPITAL_COMMUNITY)
Admission: RE | Admit: 2022-04-05 | Discharge: 2022-04-05 | Disposition: A | Discharge status: Home / Self Care | Payer: PPO | Source: Ambulatory Visit | Attending: Physician Assistant | Admitting: Physician Assistant

## 2022-04-05 DIAGNOSIS — N189 Chronic kidney disease, unspecified: Secondary | ICD-10-CM | POA: Insufficient documentation

## 2022-04-05 DIAGNOSIS — E1122 Type 2 diabetes mellitus with diabetic chronic kidney disease: Secondary | ICD-10-CM | POA: Insufficient documentation

## 2022-04-05 DIAGNOSIS — I5040 Unspecified combined systolic (congestive) and diastolic (congestive) heart failure: Secondary | ICD-10-CM | POA: Diagnosis not present

## 2022-04-05 DIAGNOSIS — Z794 Long term (current) use of insulin: Secondary | ICD-10-CM | POA: Diagnosis not present

## 2022-04-05 DIAGNOSIS — R339 Retention of urine, unspecified: Secondary | ICD-10-CM | POA: Diagnosis not present

## 2022-04-05 DIAGNOSIS — N401 Enlarged prostate with lower urinary tract symptoms: Secondary | ICD-10-CM | POA: Diagnosis not present

## 2022-04-05 HISTORY — PX: IR CATHETER TUBE CHANGE: IMG717

## 2022-04-05 LAB — GLUCOSE, CAPILLARY: Glucose-Capillary: 179 mg/dL — ABNORMAL HIGH (ref 70–99)

## 2022-04-05 MED ORDER — FENTANYL CITRATE (PF) 100 MCG/2ML IJ SOLN
INTRAMUSCULAR | Status: AC
  Filled 2022-04-05: qty 2

## 2022-04-05 MED ORDER — IOHEXOL 300 MG/ML  SOLN
50.0000 mL | Freq: Once | INTRAMUSCULAR | Status: AC | PRN
  Administered 2022-04-05: 10 mL

## 2022-04-05 MED ORDER — LIDOCAINE VISCOUS HCL 2 % MT SOLN
OROMUCOSAL | Status: AC
  Filled 2022-04-05: qty 15

## 2022-04-05 MED ORDER — LIDOCAINE HCL 1 % IJ SOLN
INTRAMUSCULAR | Status: AC
  Filled 2022-04-05: qty 20

## 2022-04-05 MED ORDER — FENTANYL CITRATE (PF) 100 MCG/2ML IJ SOLN
INTRAMUSCULAR | Status: AC | PRN
  Administered 2022-04-05: 25 ug via INTRAVENOUS

## 2022-04-05 MED ORDER — SODIUM CHLORIDE 0.9 % IV SOLN: INTRAVENOUS | Status: DC

## 2022-04-05 MED ORDER — MIDAZOLAM HCL 2 MG/2ML IJ SOLN
INTRAMUSCULAR | Status: AC | PRN
  Administered 2022-04-05: 1 mg via INTRAVENOUS

## 2022-04-05 MED ORDER — MIDAZOLAM HCL 2 MG/2ML IJ SOLN
INTRAMUSCULAR | Status: AC
  Filled 2022-04-05: qty 2

## 2022-04-05 NOTE — Procedures (Signed)
Interventional Radiology Procedure:   Indications: Suprapubic catheter needs up sizing  Procedure: Cathter change with fluoroscopy  Findings: Pigtail drain was removed and exchanged for 16 Fr balloon retention catheter.  Complications: No immediate complications noted.     EBL: Minimal  Plan: Discharge to home in 1 hour   Nathan [name] R. Anselm Pancoast, MD  Pager: [phone]

## 2022-04-05 NOTE — H&P (Signed)
Chief Complaint: Patient was seen in consultation today for urinary retention  Referring Physician(s): Dr. Jeffie Pollock  Supervising Physician: Ruthann Cancer  Patient Status: [facility] - Out-pt  History of Present Illness: Nathan [name] is a 89 y.o. male with extensive cardiac history, CKD and urinary retention due to enlarged prostate.  He was recently seen 03/04/22 for suprapubic catheter placement.  He now presents for his subsequent exchange and upsize.   He presents to [facility] today in his usual state of health.  He has been NPO this AM.  Of note, he recently met with Dr. Earleen Newport in consultation to discuss prostate artery embolization.  As such, a 16Fr SPT is planned today.  He is aware of the goals of the procedure today and is agreeable to proceed.   Past Medical History:  Diagnosis Date   Acid reflux    Aortic insufficiency    Cholecystitis    Combined systolic and diastolic heart failure ([place])    Echo 04/2018: inf-lat HK, EF 40-45, severe basal septal hypertrophy, mild conc LVH, Gr 1 DD, severe LAE, mild to mod TR, mod AI, asc Aorta 39 mm   Coronary artery disease 2002   CABG x5   Diabetes mellitus    DVT (deep venous thrombosis) (HCC)    previously on coumadin   Hypercholesteremia    Hyperlipidemia    Mitral valve regurgitation    Skin cancer    tumor removed off of his back    Past Surgical History:  Procedure Laterality Date   BILIARY DRAINAGE CATHETER PLACEMENT W/ BILE DUCT TUBE CHANGE  01/2015   CORONARY ARTERY BYPASS GRAFT  12/21/2000   x5   CORONARY STENT INTERVENTION N/A 10/02/2021   Procedure: CORONARY STENT INTERVENTION;  Surgeon: Martinique, Peter M, MD;  Location: [place] [facility];  Service: Cardiovascular;  Laterality: N/A;   IR CATHETER TUBE CHANGE  06/18/2020   IR CHOLANGIOGRAM EXISTING TUBE  12/08/2020   IR CHOLANGIOGRAM EXISTING TUBE  12/16/2020   IR EXCHANGE BILIARY DRAIN  06/18/2020   IR EXCHANGE BILIARY DRAIN  08/13/2020   IR EXCHANGE BILIARY DRAIN   10/21/2020   IR EXCHANGE BILIARY DRAIN  11/20/2020   IR PERC CHOLECYSTOSTOMY  05/02/2020   IR RADIOLOGIST EVAL & MGMT  10/24/2020   IR RADIOLOGIST EVAL & MGMT  12/02/2020   IR RADIOLOGIST EVAL & MGMT  04/02/2022   IR REMOVAL OF CALCULI/DEBRIS BILIARY DUCT/GB  11/20/2020   IR US GUIDANCE  05/02/2020   LEFT HEART CATH AND CORS/GRAFTS ANGIOGRAPHY N/A 04/06/2018   Procedure: LEFT HEART CATH AND CORS/GRAFTS ANGIOGRAPHY;  Surgeon: Lorretta Harp, MD;  Location: [place] [facility];  Service: Cardiovascular;  Laterality: N/A;   LEFT HEART CATH AND CORS/GRAFTS ANGIOGRAPHY N/A 09/30/2021   Procedure: LEFT HEART CATH AND CORS/GRAFTS ANGIOGRAPHY;  Surgeon: Troy Sine, MD;  Location: [place] [facility];  Service: Cardiovascular;  Laterality: N/A;   SKIN CANCER EXCISION     TUMOR REMOVAL     off of back, skin cancer    Allergies: Flomax [tamsulosin hcl], Lasix [furosemide], and Silodosin  Medications: Prior to Admission medications   Medication Sig Start Date End Date Taking? Authorizing Provider  albuterol (VENTOLIN HFA) 108 (90 Base) MCG/ACT inhaler Inhale 2 puffs into the lungs every 4 (four) hours as needed for wheezing or shortness of breath. 11/13/19   [provider]  apixaban (ELIQUIS) 2.5 MG TABS tablet Take 2.5 mg by mouth 2 (two) times daily.    [provider]  Blood Glucose Monitoring Suppl (ONETOUCH VERIO) w/Device KIT  09/06/17   [provider]  [employer] D3 25 MCG (1000 UT) capsule Take 1,000 Units by mouth daily. 11/11/21   [provider]  finasteride (PROSCAR) 5 MG tablet Take 5 mg by mouth daily.    [provider]  furosemide (LASIX) 80 MG tablet Take 1 tablet (80 mg total) by mouth daily as needed for fluid or edema (if you notice shortness of breath , leg swelling). Patient taking differently: Take 40 mg by mouth daily as needed for fluid or edema (if you notice shortness of breath , leg swelling). 10/05/21   Barb Merino, MD  hydrALAZINE  (APRESOLINE) 25 MG tablet Take 1 tablet (25 mg total) by mouth 3 (three) times daily. 10/14/21 10/14/22  Lyda Jester M, PA-C  insulin NPH-regular Human (NOVOLIN 70/30) (70-30) 100 UNIT/ML injection Inject 10-20 Units into the skin See admin instructions. 10 units in the morning & 20 units at dinner Sliding Scale if BS>150    [provider]  magnesium oxide (MAG-OX) 400 MG tablet Take 400 mg by mouth daily. 04/06/21   [provider]  megestrol (MEGACE) 20 MG tablet Take 20 mg by mouth daily. 08/01/20   [provider]  metoprolol succinate (TOPROL XL) 100 MG 24 hr tablet Take 1 tablet (100 mg total) by mouth daily. Take with or immediately following a meal. 10/05/21 10/05/22  Barb Merino, MD  Multiple Vitamins-Minerals (CENTRUM SILVER PO) Take 1 tablet by mouth daily.    [provider]  nitroGLYCERIN (NITROSTAT) 0.4 MG SL tablet Place 1 tablet (0.4 mg total) under the tongue every 5 (five) minutes as needed for chest pain. 04/04/18   Richardson Dopp T, PA-C  omeprazole (PRILOSEC) 20 MG capsule Take 20 mg by mouth 2 (two) times daily.  01/06/11   [provider]  ONETOUCH VERIO test strip  09/06/17   [provider]  potassium chloride SA (KLOR-CON M) 20 MEQ tablet Take 2 tablets (40 mEq total) by mouth daily as needed (take on the day you need to take Furosemide). 10/05/21   Barb Merino, MD  RELION INSULIN SYRINGE 1ML/31G 31G X 5/16" 1 ML MISC USE 1 TWICE DAILY AS DIRECTED 09/29/17   [provider]  rosuvastatin (CRESTOR) 40 MG tablet Take 1 tablet (40 mg total) by mouth daily. 10/06/21 01/04/22  Barb Merino, MD  sodium bicarbonate 650 MG tablet Take 650 mg by mouth 2 (two) times daily. 04/06/21   [provider]  triamcinolone (KENALOG) 0.025 % cream Apply topically 3 (three) times daily. 10/21/21   [provider]     Family History  Problem Relation Age of Onset   Heart attack Father    Hypertension Mother     Diabetes Brother    Cancer Sister        breast   Cancer Brother        throat & mouth    Social History   Socioeconomic History   Marital status: Divorced    Spouse name: Not on file   Number of children: 1   Years of education: Not on file   Highest education level: High school graduate  Occupational History   Occupation: Retired  Tobacco Use   Smoking status: Never   Smokeless tobacco: Never  Vaping Use   Vaping Use: Never used  Substance and Sexual Activity   Alcohol use: No   Drug use: No   Sexual activity: Not Currently  Other Topics Concern   Not on file  Social History Narrative   Not on file   Social Determinants of Health   Financial Resource Strain: Low Risk  (10/02/2021)   Overall Financial Resource Strain (CARDIA)    Difficulty of Paying Living Expenses: Not hard at all  Food Insecurity: No Food Insecurity (10/02/2021)   Hunger Vital Sign    Worried About Running Out of Food in the Last Year: Never true    Ran Out of Food in the Last Year: Never true  Transportation Needs: No Transportation Needs (10/02/2021)   PRAPARE - Hydrologist (Medical): No    Lack of Transportation (Non-Medical): No  Physical Activity: Not on file  Stress: Not on file  Social Connections: Not on file    Review of Systems  Constitutional:  Negative for chills and fever.  Eyes:  Negative for visual disturbance.  Respiratory:  Negative for cough and shortness of breath.   Cardiovascular:  Negative for chest pain and palpitations.  Gastrointestinal:  Negative for abdominal pain, constipation, nausea and vomiting.  Genitourinary:  Positive for difficulty urinating.  Neurological:  Negative for headaches.  Hematological:  Bruises/bleeds easily.  Psychiatric/Behavioral: Negative.      Vital Signs: BP (!) 171/89   Pulse 77   Temp (!) 96.9 F (36.1 C) (Temporal)   Resp 16   Ht 6' (1.829 m)   Wt 170 lb (77.1 kg)   SpO2 97%   BMI 23.06 kg/m    Physical Exam Constitutional:      General: He is not in acute distress.    Comments: Chronically ill appearing  HENT:     Mouth/Throat:     Mouth: Mucous membranes are dry.     Pharynx: Oropharynx is clear.  Eyes:     General: No scleral icterus.    Extraocular Movements: Extraocular movements intact.  Cardiovascular:     Rate and Rhythm: Normal rate.     Pulses: Normal pulses.  Pulmonary:     Effort: Pulmonary effort is normal. No respiratory distress.  Abdominal:     General: Abdomen is flat. There is no distension.     Palpations: Abdomen is soft.     Tenderness: There is no abdominal tenderness.  Genitourinary:    Comments: Indwelling foley catheter is clamped Skin:    General: Skin is dry.     Coloration: Skin is pale.  Neurological:     General: No focal deficit present.     Mental Status: He is alert and oriented to person, place, and time.  Psychiatric:        Mood and Affect: Mood normal.        Behavior: Behavior normal.   Imaging: IR Radiologist Eval & Mgmt  Result Date: 04/02/2022 EXAM: NEW PATIENT OFFICE VISIT CHIEF COMPLAINT: Electronic medical record HISTORY OF PRESENT ILLNESS: Electronic medical record REVIEW OF SYSTEMS: Electronic medical record PHYSICAL EXAMINATION: Electronic medical record ASSESSMENT AND PLAN: Electronic medical record Electronically Signed   By: Corrie Mckusick D.O.   On: 04/02/2022 13:01    Labs:  CBC: Recent Labs    10/02/21 0055 10/02/21 1941 10/03/21 0606 10/04/21 0614 10/05/21 0742  WBC 9.0  --  9.7 9.0 11.9*  HGB 12.6* 13.0 11.5* 10.4* 11.6*  HCT 37.6* 38.7* 34.4* 30.4* 34.0*  PLT 216  --  218 174 232     COAGS: Recent Labs    09/29/21 0750 09/29/21 1800 09/30/21 0316  APTT 51* 60*  82*     BMP: Recent Labs    10/02/21 0055 10/03/21 0606 10/04/21 0614 10/05/21 0742  NA 137 138 137 138  K 4.3 4.2 3.9 3.4*  CL 110 113* 111 108  CO2 20* 15* 18* 20*  GLUCOSE 119* 114* 134* 129*  BUN 37* 30* 39* 43*   CALCIUM 8.5* 8.5* 8.4* 9.1  CREATININE 2.38* 2.05* 2.30* 2.33*  GFRNONAA 26* 31* 27* 27*     LIVER FUNCTION TESTS: Recent Labs    09/28/21 1627  BILITOT 0.9  AST 51*  ALT 28  ALKPHOS 42  PROT 6.8  ALBUMIN 3.2*     Assessment and Plan:  Patient with past medical history of BPH and urinary retention presents for exchange and upsize of his recently placed SPT.   Case reviewed by Dr. Serafina Royals who approves patient for procedure.  Patient presents today in their usual state of health.  He has been NPO and is not currently on blood thinners.  His daughter [name] is available for transportation to his SNF today post procedure.   Risks and benefits discussed with the patient including bleeding, infection, damage to adjacent structures, bowel perforation/fistula connection, and sepsis.  All of the patient's questions were answered, patient is agreeable to proceed. Consent signed and in chart.     Thank you for this interesting consult.  I greatly enjoyed meeting [facility] and look forward to participating in their care.  A copy of this report was sent to the requesting provider on this date.  Electronically Signed: Docia Barrier, PA 04/05/2022, 11:22 AM   I spent a total of 15 Minutes  in face to face in clinical consultation, greater than 50% of which was counseling/coordinating care for urinary retention

## 2022-04-09 ENCOUNTER — Ambulatory Visit (INDEPENDENT_AMBULATORY_CARE_PROVIDER_SITE_OTHER): Payer: PPO | Admitting: Podiatry

## 2022-04-09 ENCOUNTER — Encounter: Payer: Self-pay | Admitting: Podiatry

## 2022-04-09 DIAGNOSIS — B351 Tinea unguium: Secondary | ICD-10-CM

## 2022-04-09 DIAGNOSIS — M79675 Pain in left toe(s): Secondary | ICD-10-CM

## 2022-04-09 DIAGNOSIS — M79674 Pain in right toe(s): Secondary | ICD-10-CM | POA: Diagnosis not present

## 2022-04-09 DIAGNOSIS — E118 Type 2 diabetes mellitus with unspecified complications: Secondary | ICD-10-CM

## 2022-04-09 NOTE — Progress Notes (Signed)
This patient returns to my office for at risk foot care.  This patient requires this care by a professional since this patient will be at risk due to having diabetes type 2, CKD and chronic insufficiensy.  Patient is taking eliquiss.  This patient is unable to cut nails himself since the patient cannot reach his nails.These nails are painful walking and wearing shoes.  This patient presents for at risk foot care today.  General Appearance  Alert, conversant and in no acute stress.  Vascular  Dorsalis pedis and posterior tibial  pulses are palpable  bilaterally.  Capillary return is within normal limits  bilaterally. Temperature is within normal limits  bilaterally.  Neurologic  Senn-Weinstein monofilament wire test diminished   bilaterally. Muscle power within normal limits bilaterally.  Nails Thick disfigured discolored nails with subungual debris  from hallux to fifth toes bilaterally. No evidence of bacterial infection or drainage bilaterally.  Orthopedic  No limitations of motion  feet .  No crepitus or effusions noted.  Hammer toes second  B/L.  MCJ DJD  B/L. HAV  B/L.  Skin  normotropic skin with no porokeratosis noted bilaterally.  No signs of infections or ulcers noted.  Asymptomatic callus 1st MPJ left foot.   Onychomycosis  Pain in right toes  Pain in left toes  Consent was obtained for treatment procedures.   Mechanical debridement of nails 1-5  bilaterally performed with a nail nipper.  Filed with dremel without incident.  Patient waiting on his diabetic shoes.   Return office visit  3 months                   Told patient to return for periodic foot care and evaluation due to potential at risk complications.   Gardiner Barefoot DPM

## 2022-04-13 DIAGNOSIS — C800 Disseminated malignant neoplasm, unspecified: Secondary | ICD-10-CM | POA: Diagnosis not present

## 2022-04-21 DIAGNOSIS — R197 Diarrhea, unspecified: Secondary | ICD-10-CM | POA: Diagnosis not present

## 2022-04-27 DIAGNOSIS — Z9359 Other cystostomy status: Secondary | ICD-10-CM | POA: Diagnosis not present

## 2022-04-27 DIAGNOSIS — R410 Disorientation, unspecified: Secondary | ICD-10-CM | POA: Diagnosis not present

## 2022-04-27 DIAGNOSIS — R197 Diarrhea, unspecified: Secondary | ICD-10-CM | POA: Diagnosis not present

## 2022-04-27 DIAGNOSIS — R338 Other retention of urine: Secondary | ICD-10-CM | POA: Diagnosis not present

## 2022-04-27 DIAGNOSIS — R627 Adult failure to thrive: Secondary | ICD-10-CM | POA: Diagnosis not present

## 2022-04-27 DIAGNOSIS — R531 Weakness: Secondary | ICD-10-CM | POA: Diagnosis not present

## 2022-05-04 ENCOUNTER — Telehealth: Payer: Self-pay | Admitting: Cardiovascular Disease

## 2022-05-04 MED ORDER — METOPROLOL SUCCINATE ER 100 MG PO TB24: 100.0000 mg | ORAL_TABLET | Freq: Every day | ORAL | 2 refills | Status: DC

## 2022-05-04 MED ORDER — POTASSIUM CHLORIDE CRYS ER 20 MEQ PO TBCR: 40.0000 meq | EXTENDED_RELEASE_TABLET | Freq: Every day | ORAL | 2 refills | Status: DC | PRN

## 2022-05-04 MED ORDER — FUROSEMIDE 80 MG PO TABS: 80.0000 mg | ORAL_TABLET | Freq: Every day | ORAL | 2 refills | Status: DC | PRN

## 2022-05-04 NOTE — Telephone Encounter (Signed)
Pt's medications were sent to his pharmacy as requested. Confirmation received.  °

## 2022-05-04 NOTE — Telephone Encounter (Signed)
*  STAT* If patient is at the pharmacy, call can be transferred to refill team.   1. Which medications need to be refilled? (please list name of each medication and dose if known)   metoprolol succinate (TOPROL XL) 100 MG 24 hr tablet  furosemide (LASIX) 80 MG tablet  potassium chloride SA (KLOR-CON M) 20 MEQ tablet     2. Which pharmacy/location (including street and city if local pharmacy) is medication to be sent to?  [facility] - [place], [place] - [place] [address]   3. Do they need a 30 day or 90 day supply?   90 day  Caller stated patient still has some medication left.

## 2022-05-18 DIAGNOSIS — N184 Chronic kidney disease, stage 4 (severe): Secondary | ICD-10-CM | POA: Diagnosis not present

## 2022-05-20 DIAGNOSIS — D692 Other nonthrombocytopenic purpura: Secondary | ICD-10-CM | POA: Diagnosis not present

## 2022-05-20 DIAGNOSIS — N189 Chronic kidney disease, unspecified: Secondary | ICD-10-CM | POA: Diagnosis not present

## 2022-05-20 DIAGNOSIS — N390 Urinary tract infection, site not specified: Secondary | ICD-10-CM | POA: Diagnosis not present

## 2022-05-20 DIAGNOSIS — Z9359 Other cystostomy status: Secondary | ICD-10-CM | POA: Diagnosis not present

## 2022-05-26 ENCOUNTER — Other Ambulatory Visit: Payer: Self-pay | Admitting: Interventional Radiology

## 2022-05-26 DIAGNOSIS — R338 Other retention of urine: Secondary | ICD-10-CM | POA: Diagnosis not present

## 2022-05-26 DIAGNOSIS — N401 Enlarged prostate with lower urinary tract symptoms: Secondary | ICD-10-CM

## 2022-05-28 DIAGNOSIS — I5042 Chronic combined systolic (congestive) and diastolic (congestive) heart failure: Secondary | ICD-10-CM | POA: Diagnosis not present

## 2022-05-28 DIAGNOSIS — N400 Enlarged prostate without lower urinary tract symptoms: Secondary | ICD-10-CM | POA: Diagnosis not present

## 2022-05-28 DIAGNOSIS — E872 Acidosis, unspecified: Secondary | ICD-10-CM | POA: Diagnosis not present

## 2022-05-28 DIAGNOSIS — E1122 Type 2 diabetes mellitus with diabetic chronic kidney disease: Secondary | ICD-10-CM | POA: Diagnosis not present

## 2022-05-28 DIAGNOSIS — E559 Vitamin D deficiency, unspecified: Secondary | ICD-10-CM | POA: Diagnosis not present

## 2022-05-28 DIAGNOSIS — R809 Proteinuria, unspecified: Secondary | ICD-10-CM | POA: Diagnosis not present

## 2022-05-28 DIAGNOSIS — I129 Hypertensive chronic kidney disease with stage 1 through stage 4 chronic kidney disease, or unspecified chronic kidney disease: Secondary | ICD-10-CM | POA: Diagnosis not present

## 2022-05-28 DIAGNOSIS — R55 Syncope and collapse: Secondary | ICD-10-CM | POA: Diagnosis not present

## 2022-05-28 DIAGNOSIS — N1832 Chronic kidney disease, stage 3b: Secondary | ICD-10-CM | POA: Diagnosis not present

## 2022-06-03 DIAGNOSIS — L814 Other melanin hyperpigmentation: Secondary | ICD-10-CM | POA: Diagnosis not present

## 2022-06-03 DIAGNOSIS — L309 Dermatitis, unspecified: Secondary | ICD-10-CM | POA: Diagnosis not present

## 2022-06-03 DIAGNOSIS — L298 Other pruritus: Secondary | ICD-10-CM | POA: Diagnosis not present

## 2022-06-03 DIAGNOSIS — S01401A Unspecified open wound of right cheek and temporomandibular area, initial encounter: Secondary | ICD-10-CM | POA: Diagnosis not present

## 2022-06-03 DIAGNOSIS — D225 Melanocytic nevi of trunk: Secondary | ICD-10-CM | POA: Diagnosis not present

## 2022-06-03 DIAGNOSIS — L821 Other seborrheic keratosis: Secondary | ICD-10-CM | POA: Diagnosis not present

## 2022-06-15 DIAGNOSIS — H019 Unspecified inflammation of eyelid: Secondary | ICD-10-CM | POA: Diagnosis not present

## 2022-06-23 DIAGNOSIS — R338 Other retention of urine: Secondary | ICD-10-CM | POA: Diagnosis not present

## 2022-06-24 DIAGNOSIS — E7800 Pure hypercholesterolemia, unspecified: Secondary | ICD-10-CM | POA: Diagnosis not present

## 2022-06-24 DIAGNOSIS — Z125 Encounter for screening for malignant neoplasm of prostate: Secondary | ICD-10-CM | POA: Diagnosis not present

## 2022-06-24 DIAGNOSIS — E1129 Type 2 diabetes mellitus with other diabetic kidney complication: Secondary | ICD-10-CM | POA: Diagnosis not present

## 2022-06-24 DIAGNOSIS — I10 Essential (primary) hypertension: Secondary | ICD-10-CM | POA: Diagnosis not present

## 2022-06-29 DIAGNOSIS — N184 Chronic kidney disease, stage 4 (severe): Secondary | ICD-10-CM | POA: Diagnosis not present

## 2022-06-29 DIAGNOSIS — E1121 Type 2 diabetes mellitus with diabetic nephropathy: Secondary | ICD-10-CM | POA: Diagnosis not present

## 2022-06-29 DIAGNOSIS — Z0000 Encounter for general adult medical examination without abnormal findings: Secondary | ICD-10-CM | POA: Diagnosis not present

## 2022-06-29 DIAGNOSIS — Z1331 Encounter for screening for depression: Secondary | ICD-10-CM | POA: Diagnosis not present

## 2022-06-29 DIAGNOSIS — I130 Hypertensive heart and chronic kidney disease with heart failure and stage 1 through stage 4 chronic kidney disease, or unspecified chronic kidney disease: Secondary | ICD-10-CM | POA: Diagnosis not present

## 2022-06-29 DIAGNOSIS — I351 Nonrheumatic aortic (valve) insufficiency: Secondary | ICD-10-CM | POA: Diagnosis not present

## 2022-06-29 DIAGNOSIS — Z794 Long term (current) use of insulin: Secondary | ICD-10-CM | POA: Diagnosis not present

## 2022-06-29 DIAGNOSIS — Z1339 Encounter for screening examination for other mental health and behavioral disorders: Secondary | ICD-10-CM | POA: Diagnosis not present

## 2022-06-29 DIAGNOSIS — N401 Enlarged prostate with lower urinary tract symptoms: Secondary | ICD-10-CM | POA: Diagnosis not present

## 2022-06-29 DIAGNOSIS — R42 Dizziness and giddiness: Secondary | ICD-10-CM | POA: Diagnosis not present

## 2022-06-29 DIAGNOSIS — I5042 Chronic combined systolic (congestive) and diastolic (congestive) heart failure: Secondary | ICD-10-CM | POA: Diagnosis not present

## 2022-06-29 DIAGNOSIS — I25810 Atherosclerosis of coronary artery bypass graft(s) without angina pectoris: Secondary | ICD-10-CM | POA: Diagnosis not present

## 2022-06-30 ENCOUNTER — Ambulatory Visit
Admission: RE | Admit: 2022-06-30 | Discharge: 2022-06-30 | Disposition: A | Discharge status: Home / Self Care | Payer: PPO | Source: Ambulatory Visit | Attending: Interventional Radiology | Admitting: Interventional Radiology

## 2022-06-30 ENCOUNTER — Telehealth: Payer: Self-pay | Admitting: Cardiovascular Disease

## 2022-06-30 DIAGNOSIS — N401 Enlarged prostate with lower urinary tract symptoms: Secondary | ICD-10-CM | POA: Diagnosis not present

## 2022-06-30 DIAGNOSIS — R338 Other retention of urine: Secondary | ICD-10-CM | POA: Diagnosis not present

## 2022-06-30 DIAGNOSIS — R3914 Feeling of incomplete bladder emptying: Secondary | ICD-10-CM | POA: Diagnosis not present

## 2022-06-30 HISTORY — PX: IR RADIOLOGIST EVAL & MGMT: IMG5224

## 2022-06-30 NOTE — Telephone Encounter (Signed)
Patient's hospice RN is requesting a copy of the patient's med list be faxed to [phone].  Please advise.

## 2022-06-30 NOTE — Telephone Encounter (Signed)
Faxed med list to hospice nurse

## 2022-06-30 NOTE — Progress Notes (Signed)
Chief Complaint: LUTS/BPH   Referring Physician(s): Dr. Annabell Howells   History of Present Illness: Nathan [name] is a 89 y.o. male presenting today to [facility] clinic as a follow up appointment, for discussion regarding his lower urinary tract symptoms and BPH.     Nathan [name] is here today again with his daughter for the interview.   History:   We met Nathan [name] 03/04/22 at [facility] when he arrived for image guided placement of supra-pubic catheter, as solution for obstructing urinary symptoms.   Dr. Belva Crome notes indicate that he has had trouble with post-void residual in the 200-500cc range, also with history of several UTI's secondary to retention, and also hematuria.  A foley was placed January of 2023 for retention with a residual of >900cc.     Symptoms refractory to medical therapy, including trials of 5-alpha-reductase inhibitors and alpha blockers. He also had some trouble with hypotension as side-effect.    He  failed voiding trials in September 2023.    We placed supra-pubic catheter 03/04/22.  This was upsized to a foley catheter 04/05/22  Nathan Neer is not a good surgical candidate for traditional surgery.      Interval History:  Today Nathan [name] and his daughter are here to discuss the possibility of performing prostate artery embolization as a treatment for his LUTS symptoms.    The major obstacle for Nathan [name] is is chronic renal insufficiency, and his risk of developing contrast-induced nephropathy and/or renal failure secondary to a significant contrast load.    Thus, today's appointment is really to investigate his risk aversion/risk tolerance to try PAE as treatment.  The other component of this, of course, is the degree to which the current supra-pubic catheter is disturbing his day-to-day living and QOL.    I explained all of this to Nathan [name] and his daughter.  I asked a few questions of Nathan. [name] to gauge his day to day satisfaction.  Regarding my question, "What is the best part of  your day?" His answer is "just relaxing at home."  Regarding my question, "What is the worst part of your day?" His response is, "I dont know."  Specifically, when I asked him if the SP tube is perceived as problematic, his response was that is not currently much of a bother.    I had previously reached out to his nephrology team to gauge his risk for CIN or renal failure after a contrast load of 100-150cc.  His team feels that his risk is certainly elevated, particularly because of his DM history.  It is difficult to estimate accurately the risk.   His daughter confirmed that the nephrology team does not believe he would ever be a candidate for HD.    I asked whether or not he has any end-of-life arrangements, a DNR, or POA.  He has none of this, and has previously made all of his own medical decisions.      Past Medical History:  Diagnosis Date   Acid reflux    Aortic insufficiency    Cholecystitis    Combined systolic and diastolic heart failure (HCC)    Echo 04/2018: inf-lat HK, EF 40-45, severe basal septal hypertrophy, mild conc LVH, Gr 1 DD, severe LAE, mild to mod TR, mod AI, asc Aorta 39 mm   Coronary artery disease 2002   CABG x5   Diabetes mellitus    DVT (deep venous thrombosis) (HCC)    previously on coumadin   Hypercholesteremia  Hyperlipidemia    Mitral valve regurgitation    Skin cancer    tumor removed off of his back    Past Surgical History:  Procedure Laterality Date   BILIARY DRAINAGE CATHETER PLACEMENT W/ BILE DUCT TUBE CHANGE  01/2015   CORONARY ARTERY BYPASS GRAFT  12/21/2000   x5   CORONARY STENT INTERVENTION N/A 10/02/2021   Procedure: CORONARY STENT INTERVENTION;  Surgeon: Swaz[place], Peter M, MD;  Location: [facility] INVASIVE CV LAB;  Service: Cardiovascular;  Laterality: N/A;   IR CATHETER TUBE CHANGE  06/18/2020   IR CATHETER TUBE CHANGE  04/05/2022   IR CHOLANGIOGRAM EXISTING TUBE  12/08/2020   IR CHOLANGIOGRAM EXISTING TUBE  12/16/2020   IR EXCHANGE BILIARY  DRAIN  06/18/2020   IR EXCHANGE BILIARY DRAIN  08/13/2020   IR EXCHANGE BILIARY DRAIN  10/21/2020   IR EXCHANGE BILIARY DRAIN  11/20/2020   IR PERC CHOLECYSTOSTOMY  05/02/2020   IR RADIOLOGIST EVAL & MGMT  10/24/2020   IR RADIOLOGIST EVAL & MGMT  12/02/2020   IR RADIOLOGIST EVAL & MGMT  04/02/2022   IR REMOVAL OF CALCULI/DEBRIS BILIARY DUCT/GB  11/20/2020   IR US GUIDANCE  05/02/2020   LEFT HEART CATH AND CORS/GRAFTS ANGIOGRAPHY N/A 04/06/2018   Procedure: LEFT HEART CATH AND CORS/GRAFTS ANGIOGRAPHY;  Surgeon: Runell Gess, MD;  Location: [facility] INVASIVE CV LAB;  Service: Cardiovascular;  Laterality: N/A;   LEFT HEART CATH AND CORS/GRAFTS ANGIOGRAPHY N/A 09/30/2021   Procedure: LEFT HEART CATH AND CORS/GRAFTS ANGIOGRAPHY;  Surgeon: Lennette Bihari, MD;  Location: [facility] INVASIVE CV LAB;  Service: Cardiovascular;  Laterality: N/A;   SKIN CANCER EXCISION     TUMOR REMOVAL     off of back, skin cancer    Allergies: Flomax [tamsulosin hcl], Lasix [furosemide], and Silodosin  Medications: Prior to Admission medications   Medication Sig Start Date End Date Taking? Authorizing Provider  albuterol (VENTOLIN HFA) 108 (90 Base) MCG/ACT inhaler Inhale 2 puffs into the lungs every 4 (four) hours as needed for wheezing or shortness of breath. 11/13/19   [provider]  apixaban (ELIQUIS) 2.5 MG TABS tablet Take 2.5 mg by mouth 2 (two) times daily.    [provider]  Blood Glucose Monitoring Suppl (ONETOUCH VERIO) w/Device KIT  09/06/17   [provider]  CVS D3 25 MCG (1000 UT) capsule Take 1,000 Units by mouth daily. 11/11/21   [provider]  finasteride (PROSCAR) 5 MG tablet Take 5 mg by mouth daily.    [provider]  furosemide (LASIX) 80 MG tablet Take 1 tablet (80 mg total) by mouth daily as needed for fluid or edema (if you notice shortness of breath , leg swelling). 05/04/22   Nahser, Deloris Ping, MD  hydrALAZINE (APRESOLINE) 25 MG tablet Take 1 tablet (25 mg  total) by mouth 3 (three) times daily. 10/14/21 10/14/22  Robbie Lis M, PA-C  insulin NPH-regular Human (NOVOLIN 70/30) (70-30) 100 UNIT/ML injection Inject 10-20 Units into the skin See admin instructions. 10 units in the morning & 20 units at dinner Sliding Scale if BS>150    [provider]  magnesium oxide (MAG-OX) 400 MG tablet Take 400 mg by mouth daily. 04/06/21   [provider]  megestrol (MEGACE) 20 MG tablet Take 20 mg by mouth daily. 08/01/20   [provider]  metoprolol succinate (TOPROL XL) 100 MG 24 hr tablet Take 1 tablet (100 mg total) by mouth daily. Take with or immediately following a meal. 05/04/22  Nahser, Deloris Ping, MD  Multiple Vitamins-Minerals (CENTRUM SILVER PO) Take 1 tablet by mouth daily.    [provider]  nitroGLYCERIN (NITROSTAT) 0.4 MG SL tablet Place 1 tablet (0.4 mg total) under the tongue every 5 (five) minutes as needed for chest pain. 04/04/18   Tereso Newcomer T, PA-C  omeprazole (PRILOSEC) 20 MG capsule Take 20 mg by mouth 2 (two) times daily.  01/06/11   [provider]  ONETOUCH VERIO test strip  09/06/17   [provider]  potassium chloride SA (KLOR-CON M) 20 MEQ tablet Take 2 tablets (40 mEq total) by mouth daily as needed (take on the day you need to take Furosemide). 05/04/22   Nahser, Deloris Ping, MD  RELION INSULIN SYRINGE 1ML/31G 31G X 5/16" 1 ML MISC USE 1 TWICE DAILY AS DIRECTED 09/29/17   [provider]  rosuvastatin (CRESTOR) 40 MG tablet Take 1 tablet (40 mg total) by mouth daily. 10/06/21 03/04/22  Dorcas Carrow, MD  sodium bicarbonate 650 MG tablet Take 650 mg by mouth 2 (two) times daily. 04/06/21   [provider]  triamcinolone (KENALOG) 0.025 % cream Apply topically 3 (three) times daily. 10/21/21   [provider]     Family History  Problem Relation Age of Onset   Heart attack Father    Hypertension Mother    Diabetes Brother    Cancer Sister        breast    Cancer Brother        throat & mouth    Social History   Socioeconomic History   Marital status: Divorced    Spouse name: Not on file   Number of children: 1   Years of education: Not on file   Highest education level: High school graduate  Occupational History   Occupation: Retired  Tobacco Use   Smoking status: Never   Smokeless tobacco: Never  Vaping Use   Vaping Use: Never used  Substance and Sexual Activity   Alcohol use: No   Drug use: No   Sexual activity: Not Currently  Other Topics Concern   Not on file  Social History Narrative   Not on file   Social Determinants of Health   Financial Resource Strain: Low Risk  (10/02/2021)   Overall Financial Resource Strain (CARDIA)    Difficulty of Paying Living Expenses: Not hard at all  Food Insecurity: No Food Insecurity (10/02/2021)   Hunger Vital Sign    Worried About Running Out of Food in the Last Year: Never true    Ran Out of Food in the Last Year: Never true  Transportation Needs: No Transportation Needs (10/02/2021)   PRAPARE - Administrator, Civil Service (Medical): No    Lack of Transportation (Non-Medical): No  Physical Activity: Not on file  Stress: Not on file  Social Connections: Not on file       Review of Systems: A 12 point ROS discussed and pertinent positives are indicated in the HPI above.  All other systems are negative.  Review of Systems  Vital Signs: BP (!) 168/82 (BP Location: Left Arm, Patient Position: Sitting, Cuff Size: Normal)   Pulse 84   Temp 98.6 F (37 C) (Oral)   Wt 68.9 kg   SpO2 99% Comment: room air  BMI 20.61 kg/m   Advance Care Plan: The advanced care plan/surrogate decision maker was discussed at the time of visit and documented in the medical record.    Physical Exam General:  89 yo male appearing stated age.  Well-developed, well-nourished.  No distress. HEENT: Atraumatic, normocephalic.  Conjugate gaze, extra-ocular motor intact. No scleral icterus  or scleral injection. No lesions on external ears, nose, lips, or gums.  Oral mucosa moist, pink.  Neck: Symmetric with no goiter enlargement.  Chest/Lungs:  Symmetric chest with inspiration/expiration.  No labored breathing.    Heart:    No JVD appreciated.  Abdomen:  Soft, NT/ND, with SP tube in place.  The tube is a balloon retention.  There is dry ostomy site with no leakage.  No erythema or sign of irritation or infection.    Genito-urinary: Deferred Neurologic:  Moving all 4 extremities with gross sensory intact.       Mallampati Score:     Imaging: No results found.  Labs:  CBC: Recent Labs    10/02/21 0055 10/02/21 1941 10/03/21 0606 10/04/21 0614 10/05/21 0742  WBC 9.0  --  9.7 9.0 11.9*  HGB 12.6* 13.0 11.5* 10.4* 11.6*  HCT 37.6* 38.7* 34.4* 30.4* 34.0*  PLT 216  --  218 174 232    COAGS: Recent Labs    09/29/21 0750 09/29/21 1800 09/30/21 0316  APTT 51* 60* 82*    BMP: Recent Labs    10/02/21 0055 10/03/21 0606 10/04/21 0614 10/05/21 0742  NA 137 138 137 138  K 4.3 4.2 3.9 3.4*  CL 110 113* 111 108  CO2 20* 15* 18* 20*  GLUCOSE 119* 114* 134* 129*  BUN 37* 30* 39* 43*  CALCIUM 8.5* 8.5* 8.4* 9.1  CREATININE 2.38* 2.05* 2.30* 2.33*  GFRNONAA 26* 31* 27* 27*    LIVER FUNCTION TESTS: Recent Labs    09/28/21 1627  BILITOT 0.9  AST 51*  ALT 28  ALKPHOS 42  PROT 6.8  ALBUMIN 3.2*    TUMOR MARKERS: No results for input(s): "AFPTM", "CEA", "CA199", "CHROMGRNA" in the last 8760 hours.  Assessment and Plan:   Nathan Derderian is an 89 yo male, with history of severe symptoms of lower urinary tract symptoms secondary to BPH, life-style limiting.   Our full consult is 04/02/22.    Currently he is being treated successfully with supra-pubic catheter. He is not a surgical candidate for formal surgical solution because of his cardiac history.    His major obstacle for a prostate artery embolization as a treatment for LUTS is his chronic renal  insufficiency grade 3b based on eGFR of ~32.    I had a lengthy discussion with Nathan Petrak and his daughter today regarding risk benefit of any proposed treatment, with the major risk for him being his renal insufficiency -- this is much like the same decision pathway for surgery or conservative management when considering his cardiac history.   After our discussion, it seems his QOL is not significantly affected by the current SP tube strategy, and therefore it seems reasonable to just continue this as the current solution.    I did let them know that if they would like to reconsider should the SP tube become problematic in the future or if becomes an issue with QOL we are happy to see him back.    They understand.   Plan: - Continue with supra-pubic drain as the solution for now.  - We are happy to see him back in the future should the SP tube affect QOL, and if they would like to discuss possible PAE again.      _________________________________________________   Emilia Beck, Bilhim TA, [place] FC, Sharol Given  Luvenia Heller, Bagla S, Sapoval Nathan, [place], [place], Monette TD, Kava BR, [place], [place], [place], [place]. Society of Interventional Radiology Multisociety Consensus Position Statement on Prostatic Artery Embolization for Treatment of Lower Urinary Tract Symptoms Attributed to Benign Prostatic Hyperplasia: From the Society of Interventional Radiology, the Cardiovascular and Interventional Radiological Society of [place], Socit [place], and the [employer] of Interventional Radiology: Endorsed by the [employer] of Cardiovascular and Interventional Radiology, Financial controller for Interventional Radiology, Orthoptist, Interventional Radiology Society of [place], [place] Society of Interventional Radiology, and [place] Society of Interventional Radiology. J Vasc Interv Radiol. 2019 May;30(5):627-637.e1. doi:  10.1016/[email or web address].[id number]. Epub 2019 Mar 27. PMID: 16109604.    Pricilla Holm, McVary KT, Elson Clan, Bixler BR, Dahm P, Dayna Barker, [place] MC, [address], [place] TS, Abner Greenspan JK, [place] CG, [place], [place], [place]. Management of Lower Urinary Tract Symptoms Attributed to Benign Prostatic Hyperplasia: AUA GUIDELINE PART I-Initial Work-up and Medical Management. J Urol. 2021 Oct;206(4):806-817. doi: 10.1097/[id number]. Epub 2021 Aug 13. Erratum in: J Urol. 2021 Nov;206(5):1339. PMID: 54098119.     Electronically Signed: Gilmer Mor 06/30/2022, 10:42 AM   I spent a total of    40 Minutes in face to face in clinical consultation, greater than 50% of which was counseling/coordinating care for BPH/LUTS, possible prostate artery embolization

## 2022-07-09 ENCOUNTER — Ambulatory Visit: Payer: PPO | Admitting: Podiatry

## 2022-07-09 ENCOUNTER — Encounter: Payer: Self-pay | Admitting: Podiatry

## 2022-07-09 DIAGNOSIS — E118 Type 2 diabetes mellitus with unspecified complications: Secondary | ICD-10-CM

## 2022-07-09 DIAGNOSIS — M79674 Pain in right toe(s): Secondary | ICD-10-CM | POA: Diagnosis not present

## 2022-07-09 DIAGNOSIS — M79675 Pain in left toe(s): Secondary | ICD-10-CM | POA: Diagnosis not present

## 2022-07-09 DIAGNOSIS — B351 Tinea unguium: Secondary | ICD-10-CM | POA: Diagnosis not present

## 2022-07-09 DIAGNOSIS — Q828 Other specified congenital malformations of skin: Secondary | ICD-10-CM | POA: Diagnosis not present

## 2022-07-09 NOTE — Progress Notes (Signed)
This patient returns to my office for at risk foot care.  This patient requires this care by a professional since this patient will be at risk due to having diabetes type 2, CKD and chronic insufficiensy.  Patient is taking eliquiss.  This patient is unable to cut nails himself since the patient cannot reach his nails.These nails are painful walking and wearing shoes.  This patient presents for at risk foot care today.  General Appearance  Alert, conversant and in no acute stress.  Vascular  Dorsalis pedis and posterior tibial  pulses are palpable  bilaterally.  Capillary return is within normal limits  bilaterally. Temperature is within normal limits  bilaterally.  Neurologic  Senn-Weinstein monofilament wire test diminished   bilaterally. Muscle power within normal limits bilaterally.  Nails Thick disfigured discolored nails with subungual debris  from hallux to fifth toes bilaterally. No evidence of bacterial infection or drainage bilaterally.  Orthopedic  No limitations of motion  feet .  No crepitus or effusions noted.  Hammer toes second  B/L.  MCJ DJD  B/L. HAV  B/L.  Skin  normotropic skin with no porokeratosis noted bilaterally.  No signs of infections or ulcers noted.  Hemorrhagic callus callus 1st MPJ left foot.   Onychomycosis  Pain in right toes  Pain in left toes  Porokeratosis left foot.  Consent was obtained for treatment procedures.   Mechanical debridement of nails 1-5  bilaterally performed with a nail nipper.  Filed with dremel without incident.  Debride callus with dremel tool.   Return office visit  3 months                   Told patient to return for periodic foot care and evaluation due to potential at risk complications.   Helane Gunther DPM

## 2022-07-13 ENCOUNTER — Telehealth: Payer: Self-pay | Admitting: Cardiovascular Disease

## 2022-07-13 NOTE — Telephone Encounter (Signed)
Pt's daughter would like a callback to discuss pt's medication list per [facility]. Please advise

## 2022-07-15 NOTE — Telephone Encounter (Signed)
Returned call and spoke with daughter on DPR who wanted to review the medications that Dr Elease Hashimoto prescribes for her dad to ensure that what they have at home is what we show he is to be on. Reviewed MAR. All questions answered.

## 2022-07-16 DIAGNOSIS — D485 Neoplasm of uncertain behavior of skin: Secondary | ICD-10-CM | POA: Diagnosis not present

## 2022-07-16 DIAGNOSIS — L308 Other specified dermatitis: Secondary | ICD-10-CM | POA: Diagnosis not present

## 2022-07-29 DIAGNOSIS — R338 Other retention of urine: Secondary | ICD-10-CM | POA: Diagnosis not present

## 2022-08-02 ENCOUNTER — Encounter: Payer: Self-pay | Admitting: Cardiovascular Disease

## 2022-08-02 NOTE — Progress Notes (Unsigned)
Nathan [name] Date of Birth  1933-12-19       [place]. [address], [place]    [place], [place]  [address]     [phone]       Fax  [phone]       Problem List: 1. Coronary artery disease-status post CABG 2. Aortic insufficiency 3. Hypercholesterolemia 4. Diabetes mellitus 5. Mitral regurgitation     Nathan [name] is doing very well. He's not had any episodes of chest pain or shortness of breath. 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  Dec. 2, 2014:  Nathan [name] is doing well.  Not as much energy.   No CP or dyspnea.    Dec. 3,2015:  Doing well from a cardiac standpoint.  started taking insulin recently.   Worked again for [name].   Dec. 2, 2016:  Doing well from a cardiac standpoint.   BP is high today , was normal yesterday   Feb. 14, 2017:   [name] is seen back for a visit BP is a bit elevated today .   Eats lots of salty foods ( soup for dinner) He had acute cholecystitis and had a perc drain placed.  The drain has been removed.  He never had his GB removed.  Was placed on home O2 during the hospitalization  And has been on home O2 since He does not think he needs it.   He occasionally will forget to turn it on and cannot tell the difference.  Will check his O2 sate on RA. ( time off oxygen  1440)   During his hospitalization, he had an echocardiogram that reveals normal left ventricle systolic function. He did have mild pulmonary hypertension. He had a stress Myoview study which revealed normal left ventricular systolic function and revealed no ischemia.  Aug. 17, 2017:  Doing well. Had the gall bladder percutaneous drain removed. Did not have GB removed.  Stands on his feet all day , has some leg edema   Feb. 13, 2018:  Doing well .   No further gall bladder issues  Sept.  26, 2018:  Nathan [name] is seen today for follow-up of his coronary artery  disease. Also  has a history of essential hypertension.  May 31, 2017 Doing well . Strawberries  Are ready - hes still working  No CP  No further gall bladder issues  August 29, 2017:  Nathan [name] is seen today  Has been having lots of orthostasis  Lightheadness. Has not been working - works at [name] - in [employer] building  Has been having lots of GI issues ( belching and GERD after eating ) , thinks he is having gall bladder issues.  Appetite has not been good  Labs from [name] office show a creatinine of 2.3  Had 4 of his pills stopped last week  Taking fish oil, amlodipine, ASA and potassium ( by mistake, was supposed to stop)  , MVI  Still eating salty meats   Oct. 21, 2019:  Nathan [name] is seen today  Has not had a good appetite for the past several months  Has a left leg DVT - Is now on Eliquis  orthostasis has improved    September 22, 2018:  Doing well.   No CP , no dyspnea.   BP is a little elevated.   March 26, 2019:  [name] is seen today for follow-up of his coronary artery disease and aortic insufficiency.  He also has a history of hyperlipidemia. Has retired from [employer].  No CP, Has some dyspnea if he works too quickly .   Feb. 4, 2022: [name] is seen today for follow up of his CAD, AI, HLD Has had some dizziness , especially when standing  He has occasional episodes of chest heaviness./ dyspnea He may have worseining CAD but we are limited in our ability to do a cath because of his CKD - stage 4.   May 13, 2020: Nathan [name] is seen today for follow up of his CAD, AI,HLD He was in the hospital recently for gram-negative sepsis with acute hypoxic respiratory failure He had + troponins likely due to demand ischemia Peak troponin was 5012  Has CKD  Baseline creatinine is between 2-3.  Just got out of hospital yesterday  WBC is still elevated.   Echocardiogram from May 02, 2020 shows mildly reduced left ventricular systolic function with an  ejection fraction of 40 to 45%.  We were unable to determine diastolic function.  There is mildly elevated pulmonary artery pressures. Mitral valve is degenerative with mild to moderate mitral stenosis. Mild AI  He has severe tricuspid regurgitation.  Was found to have gallstones.  Has a drainage tube in gall bladder   He was told that he was not an operative candidate due to excessive risk.  BP is low today.  He is on Toprol-XL 25 mg a day, isosorbide 30 mg a day, hydralazine 10 mg 3 times a day.  Will DC hydranlazine   Jun 25, 2020: [name] is seen for follow up of hospitalization for gram negative bacteremia, NSTEMI,  No further CP ,  Breathing is ok .  Still has a poor appetitie    His O2 sat has remained > 90 %.     Nov. 15, 2022 Is having some dizziness Has some dizziness when he pulls his compression hose up  ? Orthostasis Will DC Imdur and see if helps  He stopped the Tamulosin and furosemide last week after he fell .   His dizziness did not improve much   We discussed electrolyte replacement in his water.  Nuun Liquid IV  Protein shakes.   Echo from March, 2022 shows EF 40-45%.  Mild - mod Pulmonary HTN - estimated PA pressure of 40   August 03, 2021 Seen with daughter,  Nathan [name] is seen today for follow up of his CAD, atrial fib  Has had syncope - thought to be due to orthostasis Event monitor showed sinus rhythm ( including tachy and brady) Frequent episodes of SVT up to 179.  Occasional episodes of atrial fib with RVR Rare episodes of nonsustanied VT   His episodes of tachycardia did not seem to correspond to his episodes of dizziness.  Therefore I am not sure that controlling his episodes of nonsustained SVT will prevent his episodes of dizziness or syncope. Is not having much of the tachycardia    Dec. 21, 2023  Nathan [name] is seen for follow up of his CAD,, atrial fib, hx of syncope  Has shortness of breath from time to time No CP   Has PAF on  event monitor  Clinically he is in atrial fib   Has a chronic foley catheter   He may need to have an indwelling catheter/suprapubic catheter placed.  I think that he would be at acceptable risk from a cardiovascular standpoint.  Will be able to hold his Eliquis for 2 to 3 days prior to the procedure.   August 03, 2022 Joahan is seen for follow up of his CAD , atrial fib,    Current Outpatient Medications on File Prior to Visit  Medication Sig Dispense Refill   albuterol (VENTOLIN HFA) 108 (90 Base) MCG/ACT inhaler Inhale 2 puffs into the lungs every 4 (four) hours as needed for wheezing or shortness of breath.     apixaban (ELIQUIS) 2.5 MG TABS tablet Take 2.5 mg by mouth 2 (two) times daily.     Blood Glucose Monitoring Suppl (ONETOUCH VERIO) w/Device KIT      [facility] D3 25 MCG (1000 UT) capsule Take 1,000 Units by mouth daily.     finasteride (PROSCAR) 5 MG tablet Take 5 mg by mouth daily.     furosemide (LASIX) 80 MG tablet Take 1 tablet (80 mg total) by mouth daily as needed for fluid or edema (if you notice shortness of breath , leg swelling). 90 tablet 2   hydrALAZINE (APRESOLINE) 25 MG tablet Take 1 tablet (25 mg total) by mouth 3 (three) times daily. 90 tablet 0   insulin NPH-regular Human (NOVOLIN 70/30) (70-30) 100 UNIT/ML injection Inject 10-20 Units into the skin See admin instructions. 10 units in the morning & 20 units at dinner Sliding Scale if BS>150     magnesium oxide (MAG-OX) 400 MG tablet Take 400 mg by mouth daily.     megestrol (MEGACE) 20 MG tablet Take 20 mg by mouth daily.     metoprolol succinate (TOPROL XL) 100 MG 24 hr tablet Take 1 tablet (100 mg total) by mouth daily. Take with or immediately following a meal. 90 tablet 2   Multiple Vitamins-Minerals (CENTRUM SILVER PO) Take 1 tablet by mouth daily.     nitroGLYCERIN (NITROSTAT) 0.4 MG SL tablet Place 1 tablet (0.4 mg total) under the tongue every 5 (five) minutes as needed for chest pain. 25 tablet 5   omeprazole  (PRILOSEC) 20 MG capsule Take 20 mg by mouth 2 (two) times daily.      ONETOUCH VERIO test strip      potassium chloride SA (KLOR-CON M) 20 MEQ tablet Take 2 tablets (40 mEq total) by mouth daily as needed (take on the day you need to take Furosemide). 180 tablet 2   RELION INSULIN SYRINGE 1ML/31G 31G X 5/16" 1 ML MISC USE 1 TWICE DAILY AS DIRECTED  6   rosuvastatin (CRESTOR) 40 MG tablet Take 1 tablet (40 mg total) by mouth daily. 30 tablet 2   sodium bicarbonate 650 MG tablet Take 650 mg by mouth 2 (two) times daily.     triamcinolone (KENALOG) 0.025 % cream Apply topically 3 (three) times daily.     No current facility-administered medications on file prior to visit.    Allergies  Allergen Reactions   Flomax [Tamsulosin Hcl] Other (See Comments)    Dizzy   Lasix [Furosemide]     Dizziness   Silodosin Other (See Comments)    dizziness    Past Medical History:  Diagnosis Date   Acid reflux    Aortic insufficiency    Cholecystitis    Combined systolic and diastolic heart failure (HCC)    Echo 04/2018: inf-lat HK, EF 40-45, severe basal septal hypertrophy, mild conc LVH, Gr 1 DD, severe LAE, mild to mod TR, mod AI, asc Aorta 39 mm   Coronary artery disease 2002   CABG x5   Diabetes mellitus  DVT (deep venous thrombosis) (HCC)    previously on coumadin   Hypercholesteremia    Hyperlipidemia    Mitral valve regurgitation    Skin cancer    tumor removed off of his back    Past Surgical History:  Procedure Laterality Date   BILIARY DRAINAGE CATHETER PLACEMENT W/ BILE DUCT TUBE CHANGE  01/2015   CORONARY ARTERY BYPASS GRAFT  12/21/2000   x5   CORONARY STENT INTERVENTION N/A 10/02/2021   Procedure: CORONARY STENT INTERVENTION;  Surgeon: Swaziland, Peter M, MD;  Location: [facility];  Service: Cardiovascular;  Laterality: N/A;   IR CATHETER TUBE CHANGE  06/18/2020   IR CATHETER TUBE CHANGE  04/05/2022   IR CHOLANGIOGRAM EXISTING TUBE  12/08/2020   IR CHOLANGIOGRAM EXISTING  TUBE  12/16/2020   IR EXCHANGE BILIARY DRAIN  06/18/2020   IR EXCHANGE BILIARY DRAIN  08/13/2020   IR EXCHANGE BILIARY DRAIN  10/21/2020   IR EXCHANGE BILIARY DRAIN  11/20/2020   IR PERC CHOLECYSTOSTOMY  05/02/2020   IR RADIOLOGIST EVAL & MGMT  10/24/2020   IR RADIOLOGIST EVAL & MGMT  12/02/2020   IR RADIOLOGIST EVAL & MGMT  04/02/2022   IR RADIOLOGIST EVAL & MGMT  06/30/2022   IR REMOVAL OF CALCULI/DEBRIS BILIARY DUCT/GB  11/20/2020   IR US GUIDANCE  05/02/2020   LEFT HEART CATH AND CORS/GRAFTS ANGIOGRAPHY N/A 04/06/2018   Procedure: LEFT HEART CATH AND CORS/GRAFTS ANGIOGRAPHY;  Surgeon: Runell Gess, MD;  Location: [facility];  Service: Cardiovascular;  Laterality: N/A;   LEFT HEART CATH AND CORS/GRAFTS ANGIOGRAPHY N/A 09/30/2021   Procedure: LEFT HEART CATH AND CORS/GRAFTS ANGIOGRAPHY;  Surgeon: Lennette Bihari, MD;  Location: [facility];  Service: Cardiovascular;  Laterality: N/A;   SKIN CANCER EXCISION     TUMOR REMOVAL     off of back, skin cancer    Social History   Tobacco Use  Smoking Status Never  Smokeless Tobacco Never    Social History   Substance and Sexual Activity  Alcohol Use No    Family History  Problem Relation Age of Onset   Heart attack Father    Hypertension Mother    Diabetes Brother    Cancer Sister        breast   Cancer Brother        throat & mouth    Reviw of Systems:  Noted in current history, otherwise review of systems is negative.  Physical Exam: There were no vitals taken for this visit.  No BP recorded.  {Refresh Note OR Click here to enter BP  :1}***    GEN:  Well nourished, well developed in no acute distress HEENT: Normal NECK: No JVD; No carotid bruits LYMPHATICS: No lymphadenopathy CARDIAC: RRR ***, no murmurs, rubs, gallops RESPIRATORY:  Clear to auscultation without rales, wheezing or rhonchi  ABDOMEN: Soft, non-tender, non-distended MUSCULOSKELETAL:  No edema; No deformity  SKIN: Warm and dry NEUROLOGIC:   Alert and oriented x 3     ECG:        Assessment / Plan:   1. Coronary artery disease-        2.  Chronic combined systolic / diastolic CHF:        3.  Left leg DVT :  -   4.  Syncope:       5.  Atrial fibrillation:    He may need to have a suprapubic catheter placed.  Despite his cardiac issues, I think  that he would be at acceptable risk.  We would hold his Eliquis for 2 to 3 days prior to the procedure.  I will see him again in 6 months for follow-up visit.    Kristeen Miss, MD  08/02/2022 7:59 PM    [facility] [address] [place],  [address] [place], [place]  [address] Pager [phone] Phone: [phone]; Fax: [phone]

## 2022-08-03 ENCOUNTER — Ambulatory Visit: Payer: PPO | Attending: Cardiovascular Disease | Admitting: Cardiovascular Disease

## 2022-08-03 ENCOUNTER — Other Ambulatory Visit: Payer: Self-pay

## 2022-08-03 ENCOUNTER — Encounter: Payer: Self-pay | Admitting: Cardiovascular Disease

## 2022-08-03 VITALS — BP 140/80 | HR 81 | Ht 72.0 in | Wt 159.2 lb

## 2022-08-03 DIAGNOSIS — I25119 Atherosclerotic heart disease of native coronary artery with unspecified angina pectoris: Secondary | ICD-10-CM

## 2022-08-03 DIAGNOSIS — E785 Hyperlipidemia, unspecified: Secondary | ICD-10-CM

## 2022-08-03 NOTE — Patient Instructions (Signed)
Medication Instructions:  Your physician recommends that you continue on your current medications as directed. Please refer to the Current Medication list given to you today.  *If you need a refill on your cardiac medications before your next appointment, please call your pharmacy*   Lab Work: BMET --- Today   Follow-Up: At [facility], you and your health needs are our priority.  As part of our continuing mission to provide you with exceptional heart care, we have created designated Provider Care Teams.  These Care Teams include your primary Cardiologist (physician) and Advanced Practice Providers (APPs -  Physician Assistants and Nurse Practitioners) who all work together to provide you with the care you need, when you need it.    Your next appointment:   6 month(s)  Provider:   Jari Favre, PA-C, Eligha Bridegroom, NP, or Tereso Newcomer, PA-C

## 2022-08-04 LAB — BASIC METABOLIC PANEL
BUN/Creatinine Ratio: 14 (ref 10–24)
BUN: 30 mg/dL — ABNORMAL HIGH (ref 8–27)
CO2: 18 mmol/L — ABNORMAL LOW (ref 20–29)
Calcium: 8.3 mg/dL — ABNORMAL LOW (ref 8.6–10.2)
Chloride: 107 mmol/L — ABNORMAL HIGH (ref 96–106)
Creatinine, Ser: 2.13 mg/dL — ABNORMAL HIGH (ref 0.76–1.27)
Glucose: 259 mg/dL — ABNORMAL HIGH (ref 70–99)
Potassium: 4.1 mmol/L (ref 3.5–5.2)
Sodium: 139 mmol/L (ref 134–144)
eGFR: 30 mL/min/{1.73_m2} — ABNORMAL LOW (ref >59)

## 2022-08-13 DIAGNOSIS — R339 Retention of urine, unspecified: Secondary | ICD-10-CM | POA: Diagnosis not present

## 2022-08-13 DIAGNOSIS — T83091A Other mechanical complication of indwelling urethral catheter, initial encounter: Secondary | ICD-10-CM | POA: Diagnosis not present

## 2022-08-17 DIAGNOSIS — H02055 Trichiasis without entropian left lower eyelid: Secondary | ICD-10-CM | POA: Diagnosis not present

## 2022-08-17 DIAGNOSIS — C800 Disseminated malignant neoplasm, unspecified: Secondary | ICD-10-CM | POA: Diagnosis not present

## 2022-09-09 ENCOUNTER — Telehealth: Payer: Self-pay | Admitting: Cardiovascular Disease

## 2022-09-09 DIAGNOSIS — I480 Paroxysmal atrial fibrillation: Secondary | ICD-10-CM

## 2022-09-09 MED ORDER — APIXABAN 2.5 MG PO TABS: 2.5000 mg | ORAL_TABLET | Freq: Two times a day (BID) | ORAL | 1 refills | Status: DC

## 2022-09-09 NOTE — Telephone Encounter (Signed)
Prescription refill request for Eliquis received. Indication:Afib  Last office visit: 08/03/22 (Nahser)  Scr: 2.13 (08/03/22)  Age: 89 Weight: 72.2kg  Appropriate dose. Refill sent.

## 2022-09-09 NOTE — Telephone Encounter (Signed)
*  STAT* If patient is at the pharmacy, call can be transferred to refill team.   1. Which medications need to be refilled? (please list name of each medication and dose if known) apixaban (ELIQUIS) 2.5 MG TABS tablet   2. Which pharmacy/location (including street and city if local pharmacy) is medication to be sent to?  [id number] - [place], [place] - [address]    3. Do they need a 30 day or 90 day supply? 90

## 2022-09-10 DIAGNOSIS — N1832 Chronic kidney disease, stage 3b: Secondary | ICD-10-CM | POA: Diagnosis not present

## 2022-09-17 DIAGNOSIS — R809 Proteinuria, unspecified: Secondary | ICD-10-CM | POA: Diagnosis not present

## 2022-09-17 DIAGNOSIS — N1832 Chronic kidney disease, stage 3b: Secondary | ICD-10-CM | POA: Diagnosis not present

## 2022-09-17 DIAGNOSIS — I5042 Chronic combined systolic (congestive) and diastolic (congestive) heart failure: Secondary | ICD-10-CM | POA: Diagnosis not present

## 2022-09-17 DIAGNOSIS — E1122 Type 2 diabetes mellitus with diabetic chronic kidney disease: Secondary | ICD-10-CM | POA: Diagnosis not present

## 2022-09-17 DIAGNOSIS — E872 Acidosis, unspecified: Secondary | ICD-10-CM | POA: Diagnosis not present

## 2022-09-17 DIAGNOSIS — I129 Hypertensive chronic kidney disease with stage 1 through stage 4 chronic kidney disease, or unspecified chronic kidney disease: Secondary | ICD-10-CM | POA: Diagnosis not present

## 2022-09-17 DIAGNOSIS — R55 Syncope and collapse: Secondary | ICD-10-CM | POA: Diagnosis not present

## 2022-09-17 DIAGNOSIS — N400 Enlarged prostate without lower urinary tract symptoms: Secondary | ICD-10-CM | POA: Diagnosis not present

## 2022-09-17 DIAGNOSIS — E559 Vitamin D deficiency, unspecified: Secondary | ICD-10-CM | POA: Diagnosis not present

## 2022-09-27 ENCOUNTER — Encounter: Payer: Self-pay | Admitting: Registered Nurse

## 2022-10-08 ENCOUNTER — Encounter: Payer: Self-pay | Admitting: Podiatry

## 2022-10-08 ENCOUNTER — Ambulatory Visit: Payer: Medicare Other | Admitting: Podiatry

## 2022-10-08 DIAGNOSIS — E118 Type 2 diabetes mellitus with unspecified complications: Secondary | ICD-10-CM | POA: Diagnosis not present

## 2022-10-08 DIAGNOSIS — M79675 Pain in left toe(s): Secondary | ICD-10-CM | POA: Diagnosis not present

## 2022-10-08 DIAGNOSIS — B351 Tinea unguium: Secondary | ICD-10-CM

## 2022-10-08 DIAGNOSIS — Q828 Other specified congenital malformations of skin: Secondary | ICD-10-CM | POA: Diagnosis not present

## 2022-10-08 DIAGNOSIS — M79674 Pain in right toe(s): Secondary | ICD-10-CM

## 2022-10-08 NOTE — Progress Notes (Signed)
This patient returns to my office for at risk foot care.  This patient requires this care by a professional since this patient will be at risk due to having diabetes type 2, CKD and chronic insufficiensy.  Patient is taking eliquiss.  This patient is unable to cut nails himself since the patient cannot reach his nails.These nails are painful walking and wearing shoes.  This patient presents for at risk foot care today.  General Appearance  Alert, conversant and in no acute stress.  Vascular  Dorsalis pedis and posterior tibial  pulses are palpable  bilaterally.  Capillary return is within normal limits  bilaterally. Temperature is within normal limits  bilaterally.  Neurologic  Senn-Weinstein monofilament wire test diminished   bilaterally. Muscle power within normal limits bilaterally.  Nails Thick disfigured discolored nails with subungual debris  from hallux to fifth toes bilaterally. No evidence of bacterial infection or drainage bilaterally.  Orthopedic  No limitations of motion  feet .  No crepitus or effusions noted.  Hammer toes second  B/L.  MCJ DJD  B/L. HAV  B/L.  Skin  normotropic skin with no porokeratosis noted bilaterally.  No signs of infections or ulcers noted.  Hemorrhagic callus callus 1st MPJ left foot.   Onychomycosis  Pain in right toes  Pain in left toes  Porokeratosis left foot.  Consent was obtained for treatment procedures.   Mechanical debridement of nails 1-5  bilaterally performed with a nail nipper.  Filed with dremel without incident.  Debride callus with dremel tool.   Return office visit  3 months                   Told patient to return for periodic foot care and evaluation due to potential at risk complications.   Helane Gunther DPM

## 2022-10-28 ENCOUNTER — Ambulatory Visit: Payer: PPO

## 2022-10-28 DIAGNOSIS — E118 Type 2 diabetes mellitus with unspecified complications: Secondary | ICD-10-CM

## 2022-11-01 NOTE — Progress Notes (Signed)
Patient presents to the office today for diabetic shoe and insole measuring.  Patient was measured with brannock device to determine size and width for 1 pair of extra depth shoes and foam casted for 3 pair of insoles.   Documentation of medical necessity will be sent to patient's treating diabetic doctor to verify and sign.   Patient's diabetic provider: Dr Ebbie Latus and insoles will be ordered at that time and patient will be notified for an appointment for fitting when they arrive.  Shoe size (per patient): 11.5 Brannock measurement: 11 Patient shoe selection- Shoe choice:   [id number] Shoe size ordered: 11.7M  Abn and [employer]anc[employer] signed  Addison Bailey CPed, CFo, CFm

## 2022-12-10 DIAGNOSIS — L578 Other skin changes due to chronic exposure to nonionizing radiation: Secondary | ICD-10-CM | POA: Diagnosis not present

## 2022-12-10 DIAGNOSIS — L570 Actinic keratosis: Secondary | ICD-10-CM | POA: Diagnosis not present

## 2022-12-10 DIAGNOSIS — Z85828 Personal history of other malignant neoplasm of skin: Secondary | ICD-10-CM | POA: Diagnosis not present

## 2022-12-10 DIAGNOSIS — L821 Other seborrheic keratosis: Secondary | ICD-10-CM | POA: Diagnosis not present

## 2022-12-10 DIAGNOSIS — Z08 Encounter for follow-up examination after completed treatment for malignant neoplasm: Secondary | ICD-10-CM | POA: Diagnosis not present

## 2022-12-10 DIAGNOSIS — L820 Inflamed seborrheic keratosis: Secondary | ICD-10-CM | POA: Diagnosis not present

## 2022-12-10 DIAGNOSIS — L538 Other specified erythematous conditions: Secondary | ICD-10-CM | POA: Diagnosis not present

## 2023-01-11 ENCOUNTER — Encounter: Payer: Self-pay | Admitting: Podiatry

## 2023-01-11 ENCOUNTER — Ambulatory Visit: Payer: PPO | Admitting: Podiatry

## 2023-01-11 VITALS — Ht 72.0 in | Wt 159.2 lb

## 2023-01-11 DIAGNOSIS — M79674 Pain in right toe(s): Secondary | ICD-10-CM

## 2023-01-11 DIAGNOSIS — M79675 Pain in left toe(s): Secondary | ICD-10-CM

## 2023-01-11 DIAGNOSIS — Q828 Other specified congenital malformations of skin: Secondary | ICD-10-CM

## 2023-01-11 DIAGNOSIS — E118 Type 2 diabetes mellitus with unspecified complications: Secondary | ICD-10-CM

## 2023-01-11 DIAGNOSIS — B351 Tinea unguium: Secondary | ICD-10-CM

## 2023-01-11 NOTE — Progress Notes (Signed)
This patient returns to my office for at risk foot care.  This patient requires this care by a professional since this patient will be at risk due to having diabetes type 2, CKD and chronic insufficiensy.  Patient is taking eliquiss.  This patient is unable to cut nails himself since the patient cannot reach his nails.These nails are painful walking and wearing shoes.  Patient has painful callus left forefoot.  This patient presents for at risk foot care today.  General Appearance  Alert, conversant and in no acute stress.  Vascular  Dorsalis pedis and posterior tibial  pulses are palpable  bilaterally.  Capillary return is within normal limits  bilaterally. Temperature is within normal limits  bilaterally.  Neurologic  Senn-Weinstein monofilament wire test diminished   bilaterally. Muscle power within normal limits bilaterally.  Nails Thick disfigured discolored nails with subungual debris  from hallux to fifth toes bilaterally. No evidence of bacterial infection or drainage bilaterally.  Orthopedic  No limitations of motion  feet .  No crepitus or effusions noted.  Hammer toes second  B/L.  MCJ DJD  B/L. HAV  B/L.  Skin  normotropic skin with no porokeratosis noted bilaterally.  No signs of infections or ulcers noted.  Hemorrhagic callus callus 1st MPJ left foot.   Onychomycosis  Pain in right toes  Pain in left toes  Porokeratosis left foot.  Consent was obtained for treatment procedures.   Mechanical debridement of nails 1-5  bilaterally performed with a nail nipper.  Filed with dremel without incident.  Debride callus with # 15 blade and dremel tool.   Return office visit  3 months                   Told patient to return for periodic foot care and evaluation due to potential at risk complications.   Helane Gunther DPM

## 2023-01-26 DIAGNOSIS — N400 Enlarged prostate without lower urinary tract symptoms: Secondary | ICD-10-CM | POA: Diagnosis not present

## 2023-01-26 DIAGNOSIS — N179 Acute kidney failure, unspecified: Secondary | ICD-10-CM | POA: Diagnosis not present

## 2023-01-26 DIAGNOSIS — E872 Acidosis, unspecified: Secondary | ICD-10-CM | POA: Diagnosis not present

## 2023-01-26 DIAGNOSIS — I129 Hypertensive chronic kidney disease with stage 1 through stage 4 chronic kidney disease, or unspecified chronic kidney disease: Secondary | ICD-10-CM | POA: Diagnosis not present

## 2023-01-26 DIAGNOSIS — I5042 Chronic combined systolic (congestive) and diastolic (congestive) heart failure: Secondary | ICD-10-CM | POA: Diagnosis not present

## 2023-01-26 DIAGNOSIS — R55 Syncope and collapse: Secondary | ICD-10-CM | POA: Diagnosis not present

## 2023-01-26 DIAGNOSIS — N1832 Chronic kidney disease, stage 3b: Secondary | ICD-10-CM | POA: Diagnosis not present

## 2023-01-26 DIAGNOSIS — E1122 Type 2 diabetes mellitus with diabetic chronic kidney disease: Secondary | ICD-10-CM | POA: Diagnosis not present

## 2023-01-26 DIAGNOSIS — E559 Vitamin D deficiency, unspecified: Secondary | ICD-10-CM | POA: Diagnosis not present

## 2023-01-26 DIAGNOSIS — R809 Proteinuria, unspecified: Secondary | ICD-10-CM | POA: Diagnosis not present

## 2023-01-30 NOTE — Progress Notes (Unsigned)
Cardiology Office Note:  .   Date:  02/03/2023  ID:  Nathan [name], DOB 1933/05/16, MRN [id number] PCP: Garlan Fillers, MD  [facility] Providers Cardiologist:  Kristeen Miss, MD    Patient Profile: .      PMH Coronary artery disease S/p CABG x 5 in 2002 Aortic valve insufficiency Aortic valve stenosis Atrial fibrillation on chronic anticoagulation Chronic combined systolic and diastolic heart failure Severe tricuspid regurgitation Hypertension Hyperlipidemia DVT  Long-time patient of Dr. Elease Hashimoto with history as outlined above. Previously worked at [employer].   Admission March 2022 for gram-negative sepsis with acute hypoxic respiratory failure, peak at bedtime troponin 5012, likely due to demand ischemia.  Echocardiogram 05/02/2020 revealed mildly reduced LVEF 40 to 45%, unable to determine diastolic function, mildly elevated pulmonary artery pressures, severe TR, degenerative MV with mild to moderate mitral stenosis and mild AI.  Was found to have gallstones but was told he was not an operative candidate.  Cardiac monitor 12/30/2020 revealed episodes of atrial flutter with bundle branch block and he was started on metoprolol.  He was also started on Eliquis 2.5 mg twice daily for stroke prevention.  Noted to have severe prostatomegaly during ED admission 03/12/2021 for constipation and vomiting.  Had significant urinary retention > 900 cc.   Admission 2/10-2/13/2023 for syncope.  He reported multiple episodes of syncope over the previous year with concerns for orthostasis.  Antihypertensives were held and Lasix and tamsulosin were stopped with initial improvement.  He had not been wearing compression socks which have been advised.  All episodes of syncope occurred while standing.  He did not have associated symptoms of chest pain, shortness of breath, nausea, diaphoresis, or palpitations.  Symptoms initially improved after BP meds were held and when wearing compression  stockings.  He was discharged on metoprolol to tartrate 12.5 mg twice daily. At follow-up office visit, he admitted he was not monitoring home BP.  Only drinking 2 to 4 cups of water a day.  He continued to live independently with his daughter helping him frequently.  Cardiac monitor completed 04/2021 revealed frequent SVT and metoprolol was increased to 25 mg twice daily.  He admitted to frequently skipping lunch as well.  He was encouraged to improve hydration and nutrition.  His baseline creatinine was felt to be 2.0.  Most recent cardiology clinic visit was 08/03/2022 with Dr. Elease Hashimoto.  He was seen with his daughter Nathan [name].  He was under the care of hospice and many of his meds were being held.  He admitted to a little bit more difficulty breathing.  He continued to eat lots of processed foods daily. SCr was 2.13 at that time and was felt to be stable.       History of Present Illness: .   Nathan [name] is a very pleasant 89 y.o. male who is here today for 6 month follow-up. He is here alone today and drove himself here. His daughter had recent knee replacement surgery. He had excision of skin cancer on his left temple which is bandaged. He denies any recent falls or dizziness, which has improved over the past week. He has not noticed any significant hypotension or instability while walking. He denies chest pain, potation's, shortness of breath, orthopnea, PND, edema. Has a urinary catheter, which occasionally causes irritation at the insertion site. He has not noticed any significant bleeding, but reports occasional bright red blood on toilet paper, which he attributes to hemorrhoids. PCP advised him to reduce Lasix and  potassium to 4 days per week due to recent renal function results. He reports drinking an adequate amount of water daily but he does not measure it. He does not have any specific concerns today.  Discussed the use of AI scribe software for clinical note transcription with the patient, who  gave verbal consent to proceed.   ROS: See HPI       Studies Reviewed: .        Risk Assessment/Calculations:    CHA2DS2-VASc Score = 6   This indicates a 9.7% annual risk of stroke. The patient's score is based upon: CHF History: 1 HTN History: 1 Diabetes History: 1 Stroke History: 0 Vascular Disease History: 1 Age Score: 2 Gender Score: 0            Physical Exam:   VS:  BP 126/70   Pulse 90   Ht 6' (1.829 m)   Wt 158 lb 9.6 oz (71.9 kg)   SpO2 98%   BMI 21.51 kg/m    Wt Readings from Last 3 Encounters:  02/03/23 158 lb 9.6 oz (71.9 kg)  01/11/23 159 lb 3.2 oz (72.2 kg)  08/03/22 159 lb 3.2 oz (72.2 kg)    GEN: Frail, elderly in no acute distres NECK: No JVD; No carotid bruits CARDIAC: RRR, no murmurs, rubs, gallops RESPIRATORY:  Clear to auscultation without rales, wheezing or rhonchi  ABDOMEN: Soft, non-tender, non-distended EXTREMITIES:  No edema; No deformity     ASSESSMENT AND PLAN: .    CAD without angina: History of CABG x 5 in 2002. Most recent cath 09/30/2021 with severe multivessel CAD, patent LIMA-LAD, new 95% near ostial/proximal SVG graft stenosis in the sequential graft supplying the PDA and PLA vessel treated with PCI/DES x 1. He denies chest pain, dyspnea, or other symptoms concerning for angina. No indication for further ischemic evaluation at this time.  Occasional mild rectal bleeding secondary to hemorrhoids. Not on aspirin secondary to [facility] for a fib. Continue metoprolol, rosuvastatin.   Chronic combined CHF: Echo 09/29/2021 with LVEF 30-35%, no rwma, mild LVH, indeterminate diastolic function, normal RV.  He does not monitor daily weight or fluid intake.  No evidence of volume overload on exam today.  He denies shortness of breath, orthopnea, PND, edema. Lasix dose recently reduced due to worsening kidney function; volume status appears to be well controlled on this dose. We will recheck BMET today. Continue metoprolol, Lasix.  Hypertension: BP  is well controlled.  He reports dizziness has improved.  No recent episodes of lightheadedness, presyncope, or syncope. We will get BMET today to evaluate renal function and electrolytes.   Aortic insufficiency: Mild calcification of the aortic valve with mild aortic regurgitation and no evidence of aortic stenosis.He is not having any concerning symptoms and he would not be a likely candidate for intervention. We will continue to monitor clinically for now.  Mitral regurgitation: Moderate to severe mitral valve regurgitation on echo 09/29/2021. He is not having any concerning symptoms and he would not be a likely candidate for intervention. We will continue to monitor clinically for now.  PAF on chronic anticoagulation: HR is well-controlled at 90 bpm.  He denies tachypalpitations.  Mild rectal bleeding 2/2 hemorrhoids. Continue Eliquis 2.5 mg twice daily which is appropriate dose for stroke prevention for CHA2DS2-VASc score of 6.  Continue metoprolol for rate control.        Dispo: 3-4 months with Dr. Elease Hashimoto  Signed, Eligha Bridegroom, NP-C

## 2023-02-02 DIAGNOSIS — D485 Neoplasm of uncertain behavior of skin: Secondary | ICD-10-CM | POA: Diagnosis not present

## 2023-02-03 ENCOUNTER — Other Ambulatory Visit: Payer: Self-pay | Admitting: *Deleted

## 2023-02-03 ENCOUNTER — Encounter: Payer: Self-pay | Admitting: Nurse Practitioner

## 2023-02-03 ENCOUNTER — Ambulatory Visit: Payer: PPO | Attending: Nurse Practitioner | Admitting: Nurse Practitioner

## 2023-02-03 VITALS — BP 126/70 | HR 90 | Ht 72.0 in | Wt 158.6 lb

## 2023-02-03 DIAGNOSIS — I5022 Chronic systolic (congestive) heart failure: Secondary | ICD-10-CM

## 2023-02-03 DIAGNOSIS — Z7901 Long term (current) use of anticoagulants: Secondary | ICD-10-CM | POA: Diagnosis not present

## 2023-02-03 DIAGNOSIS — Z951 Presence of aortocoronary bypass graft: Secondary | ICD-10-CM | POA: Diagnosis not present

## 2023-02-03 DIAGNOSIS — I25118 Atherosclerotic heart disease of native coronary artery with other forms of angina pectoris: Secondary | ICD-10-CM | POA: Diagnosis not present

## 2023-02-03 DIAGNOSIS — I25119 Atherosclerotic heart disease of native coronary artery with unspecified angina pectoris: Secondary | ICD-10-CM

## 2023-02-03 DIAGNOSIS — I480 Paroxysmal atrial fibrillation: Secondary | ICD-10-CM

## 2023-02-03 DIAGNOSIS — Z79899 Other long term (current) drug therapy: Secondary | ICD-10-CM | POA: Diagnosis not present

## 2023-02-03 DIAGNOSIS — I5042 Chronic combined systolic (congestive) and diastolic (congestive) heart failure: Secondary | ICD-10-CM | POA: Diagnosis not present

## 2023-02-03 DIAGNOSIS — E785 Hyperlipidemia, unspecified: Secondary | ICD-10-CM

## 2023-02-03 DIAGNOSIS — I340 Nonrheumatic mitral (valve) insufficiency: Secondary | ICD-10-CM

## 2023-02-03 DIAGNOSIS — I351 Nonrheumatic aortic (valve) insufficiency: Secondary | ICD-10-CM

## 2023-02-03 NOTE — Patient Instructions (Signed)
Medication Instructions:   Your physician recommends that you continue on your current medications as directed. Please refer to the Current Medication list given to you today.   *If you need a refill on your cardiac medications before your next appointment, please call your pharmacy*   Lab Work:  TODAY!!! BMET  If you have labs (blood work) drawn today and your tests are completely normal, you will receive your results only by: MyChart Message (if you have MyChart) OR A paper copy in the mail If you have any lab test that is abnormal or we need to change your treatment, we will call you to review the results.   Testing/Procedures:  None ordered.   Follow-Up: At [facility], you and your health needs are our priority.  As part of our continuing mission to provide you with exceptional heart care, we have created designated Provider Care Teams.  These Care Teams include your primary Cardiologist (physician) and Advanced Practice Providers (APPs -  Physician Assistants and Nurse Practitioners) who all work together to provide you with the care you need, when you need it.  We recommend signing up for the patient portal called "MyChart".  Sign up information is provided on this After Visit Summary.  MyChart is used to connect with patients for Virtual Visits (Telemedicine).  Patients are able to view lab/test results, encounter notes, upcoming appointments, etc.  Non-urgent messages can be sent to your provider as well.   To learn more about what you can do with MyChart, go to [email or web address].    Your next appointment:   3 month(s)  Provider:   Kristeen Miss, MD

## 2023-02-04 LAB — BASIC METABOLIC PANEL
BUN/Creatinine Ratio: 19 (ref 10–24)
BUN: 60 mg/dL — ABNORMAL HIGH (ref 8–27)
CO2: 17 mmol/L — ABNORMAL LOW (ref 20–29)
Calcium: 9.2 mg/dL (ref 8.6–10.2)
Chloride: 102 mmol/L (ref 96–106)
Creatinine, Ser: 3.23 mg/dL — ABNORMAL HIGH (ref 0.76–1.27)
Glucose: 331 mg/dL — ABNORMAL HIGH (ref 70–99)
Potassium: 4.7 mmol/L (ref 3.5–5.2)
Sodium: 138 mmol/L (ref 134–144)
eGFR: 18 mL/min/{1.73_m2} — ABNORMAL LOW (ref >59)

## 2023-02-07 ENCOUNTER — Other Ambulatory Visit: Payer: Self-pay | Admitting: *Deleted

## 2023-02-07 DIAGNOSIS — Z79899 Other long term (current) drug therapy: Secondary | ICD-10-CM

## 2023-02-07 DIAGNOSIS — Z951 Presence of aortocoronary bypass graft: Secondary | ICD-10-CM

## 2023-02-07 DIAGNOSIS — I5022 Chronic systolic (congestive) heart failure: Secondary | ICD-10-CM

## 2023-02-07 DIAGNOSIS — I480 Paroxysmal atrial fibrillation: Secondary | ICD-10-CM

## 2023-02-07 DIAGNOSIS — I25119 Atherosclerotic heart disease of native coronary artery with unspecified angina pectoris: Secondary | ICD-10-CM

## 2023-02-07 MED ORDER — FUROSEMIDE 80 MG PO TABS: ORAL_TABLET | ORAL | Status: DC

## 2023-03-02 ENCOUNTER — Other Ambulatory Visit: Payer: Self-pay | Admitting: Cardiovascular Disease

## 2023-03-02 DIAGNOSIS — I480 Paroxysmal atrial fibrillation: Secondary | ICD-10-CM

## 2023-03-02 NOTE — Telephone Encounter (Signed)
 Prescription refill request for Eliquis  received. Indication:afib Last office visit:12/24 Scr:3.23  12/24 Age: 89 Weight:71.9  kg  Prescription refilled

## 2023-03-15 DIAGNOSIS — R338 Other retention of urine: Secondary | ICD-10-CM | POA: Diagnosis not present

## 2023-03-23 ENCOUNTER — Telehealth: Payer: Self-pay

## 2023-03-23 NOTE — Telephone Encounter (Signed)
 Shoe order: TSF received but CN were never signed off by Dr. Efraim Grange, I re-faxed the CN form today / confirmation received  Britton Cane Cped, CFo, CFm

## 2023-04-12 DIAGNOSIS — Z9359 Other cystostomy status: Secondary | ICD-10-CM | POA: Diagnosis not present

## 2023-04-12 DIAGNOSIS — E440 Moderate protein-calorie malnutrition: Secondary | ICD-10-CM | POA: Diagnosis not present

## 2023-04-12 DIAGNOSIS — E1129 Type 2 diabetes mellitus with other diabetic kidney complication: Secondary | ICD-10-CM | POA: Diagnosis not present

## 2023-04-12 DIAGNOSIS — I5042 Chronic combined systolic (congestive) and diastolic (congestive) heart failure: Secondary | ICD-10-CM | POA: Diagnosis not present

## 2023-04-12 DIAGNOSIS — I25810 Atherosclerosis of coronary artery bypass graft(s) without angina pectoris: Secondary | ICD-10-CM | POA: Diagnosis not present

## 2023-04-12 DIAGNOSIS — N184 Chronic kidney disease, stage 4 (severe): Secondary | ICD-10-CM | POA: Diagnosis not present

## 2023-04-12 DIAGNOSIS — Z794 Long term (current) use of insulin: Secondary | ICD-10-CM | POA: Diagnosis not present

## 2023-04-12 DIAGNOSIS — I351 Nonrheumatic aortic (valve) insufficiency: Secondary | ICD-10-CM | POA: Diagnosis not present

## 2023-04-12 DIAGNOSIS — E1121 Type 2 diabetes mellitus with diabetic nephropathy: Secondary | ICD-10-CM | POA: Diagnosis not present

## 2023-04-12 DIAGNOSIS — R42 Dizziness and giddiness: Secondary | ICD-10-CM | POA: Diagnosis not present

## 2023-04-13 ENCOUNTER — Ambulatory Visit: Payer: PPO | Admitting: Podiatry

## 2023-04-13 ENCOUNTER — Encounter: Payer: Self-pay | Admitting: Podiatry

## 2023-04-13 DIAGNOSIS — E118 Type 2 diabetes mellitus with unspecified complications: Secondary | ICD-10-CM

## 2023-04-13 DIAGNOSIS — B351 Tinea unguium: Secondary | ICD-10-CM

## 2023-04-13 DIAGNOSIS — Q828 Other specified congenital malformations of skin: Secondary | ICD-10-CM

## 2023-04-13 DIAGNOSIS — M79675 Pain in left toe(s): Secondary | ICD-10-CM

## 2023-04-13 DIAGNOSIS — M79674 Pain in right toe(s): Secondary | ICD-10-CM | POA: Diagnosis not present

## 2023-04-13 NOTE — Progress Notes (Signed)
 This patient returns to my office for at risk foot care.  This patient requires this care by a professional since this patient will be at risk due to having diabetes type 2, CKD and chronic insufficiensy.  Patient is taking eliquiss.  This patient is unable to cut nails himself since the patient cannot reach his nails.These nails are painful walking and wearing shoes.  Patient has painful callus left forefoot.  This patient presents for at risk foot care today.  General Appearance  Alert, conversant and in no acute stress.  Vascular  Dorsalis pedis and posterior tibial  pulses are palpable  bilaterally.  Capillary return is within normal limits  bilaterally. Temperature is within normal limits  bilaterally.  Neurologic  Senn-Weinstein monofilament wire test diminished   bilaterally. Muscle power within normal limits bilaterally.  Nails Thick disfigured discolored nails with subungual debris  from hallux to fifth toes bilaterally. No evidence of bacterial infection or drainage bilaterally.  Orthopedic  No limitations of motion  feet .  No crepitus or effusions noted.  Hammer toes second  B/L.  MCJ DJD  B/L. HAV  B/L.  Skin  normotropic skin with no porokeratosis noted bilaterally.  No signs of infections or ulcers noted.  Hemorrhagic callus callus 1st MPJ left foot.   Onychomycosis  Pain in right toes  Pain in left toes  Porokeratosis left foot.  Consent was obtained for treatment procedures.   Mechanical debridement of nails 1-5  bilaterally performed with a nail nipper.  Filed with dremel without incident.  Debride callus with nail nipper  and dremel tool.   Return office visit  3 months                   Told patient to return for periodic foot care and evaluation due to potential at risk complications.   Helane Gunther DPM

## 2023-04-18 DIAGNOSIS — R338 Other retention of urine: Secondary | ICD-10-CM | POA: Diagnosis not present

## 2023-04-20 DIAGNOSIS — C44329 Squamous cell carcinoma of skin of other parts of face: Secondary | ICD-10-CM | POA: Diagnosis not present

## 2023-04-23 ENCOUNTER — Other Ambulatory Visit: Payer: Self-pay | Admitting: Cardiovascular Disease

## 2023-04-25 DIAGNOSIS — N184 Chronic kidney disease, stage 4 (severe): Secondary | ICD-10-CM | POA: Diagnosis not present

## 2023-05-01 ENCOUNTER — Encounter: Payer: Self-pay | Admitting: Cardiovascular Disease

## 2023-05-01 NOTE — Progress Notes (Unsigned)
 Cardiology Office Note:  .   Date:  05/02/2023  ID:  Nathan [name], DOB Oct 13, 1933, MRN [id number] PCP: Nathan Fillers, MD   [facility] Providers Cardiologist:  Nathan Miss, MD    Patient Profile: .      PMH Coronary artery disease S/p CABG x 5 in 2002 Aortic valve insufficiency Aortic valve stenosis Atrial fibrillation on chronic anticoagulation Chronic combined systolic and diastolic heart failure Severe tricuspid regurgitation Hypertension Hyperlipidemia DVT  Long-time patient of Dr. Elease [name] with history as outlined above. Previously worked at [employer].   Admission March 2022 for gram-negative sepsis with acute hypoxic respiratory failure, peak at bedtime troponin 5012, likely due to demand ischemia.  Echocardiogram 05/02/2020 revealed mildly reduced LVEF 40 to 45%, unable to determine diastolic function, mildly elevated pulmonary artery pressures, severe TR, degenerative MV with mild to moderate mitral stenosis and mild AI.  Was found to have gallstones but was told he was not an operative candidate.  Cardiac monitor 12/30/2020 revealed episodes of atrial flutter with bundle branch block and he was started on metoprolol.  He was also started on Eliquis 2.5 mg twice daily for stroke prevention.  Noted to have severe prostatomegaly during ED admission 03/12/2021 for constipation and vomiting.  Had significant urinary retention > 900 cc.   Admission 2/10-2/13/2023 for syncope.  He reported multiple episodes of syncope over the previous year with concerns for orthostasis.  Antihypertensives were held and Lasix and tamsulosin were stopped with initial improvement.  He had not been wearing compression socks which have been advised.  All episodes of syncope occurred while standing.  He did not have associated symptoms of chest pain, shortness of breath, nausea, diaphoresis, or palpitations.  Symptoms initially improved after BP meds were held and when wearing compression  stockings.  He was discharged on metoprolol to tartrate 12.5 mg twice daily. At follow-up office visit, he admitted he was not monitoring home BP.  Only drinking 2 to 4 cups of water a day.  He continued to live independently with his daughter helping him frequently.  Cardiac monitor completed 04/2021 revealed frequent SVT and metoprolol was increased to 25 mg twice daily.  He admitted to frequently skipping lunch as well.  He was encouraged to improve hydration and nutrition.  His baseline creatinine was felt to be 2.0.  Most recent cardiology clinic visit was 08/03/2022 with Dr. Elease [name].  He was seen with his daughter Nathan [name].  He was under the care of hospice and many of his meds were being held.  He admitted to a little bit more difficulty breathing.  He continued to eat lots of processed foods daily. SCr was 2.13 at that time and was felt to be stable.       History of Present Illness: .   Nathan [name] is a very pleasant 89 y.o. male who is here today for 6 month follow-up. He is here alone today and drove himself here. His daughter had recent knee replacement surgery. He had excision of skin cancer on his left temple which is bandaged. He denies any recent falls or dizziness, which has improved over the past week. He has not noticed any significant hypotension or instability while walking. He denies chest pain, potation's, shortness of breath, orthopnea, PND, edema. Has a urinary catheter, which occasionally causes irritation at the insertion site. He has not noticed any significant bleeding, but reports occasional bright red blood on toilet paper, which he attributes to hemorrhoids. PCP advised him to reduce Lasix and  potassium to 4 days per week due to recent renal function results. He reports drinking an adequate amount of water daily but he does not measure it. He does not have any specific concerns today.    May 02, 2023 Nathan [name] is seen for follow up of his CAD , CHF, atrial fib , orthostatic  hypotension  He is on Eliquis    Has not been exercising much Get short of breath with exertion Has not had to use the NTG   He is in rapid atrial fib today  Does not feel very well but not bad enough to go to the hospital  He said his Toprol XL 100 mg was stopped several weeks and he has run out.  Will restart Toprol XL 50 mg a day  I will see him in 2 weeks for dose titration and possibly set him up for a cardioversion      ROS: See HPI       Studies Reviewed: [name]   EKG Interpretation Date/Time:  Monday May 02 2023 11:31:44 EDT Ventricular Rate:  130 PR Interval:    QRS Duration:  150 QT Interval:  304 QTC Calculation: 447 R Axis:   -33  Text Interpretation: Atrial fibrillation with rapid ventricular response with premature ventricular or aberrantly conducted complexes Left axis deviation Right bundle branch block Minimal voltage criteria for LVH, may be normal variant ( R in aVL ) Septal infarct , age undetermined T wave abnormality, consider lateral ischemia When compared with ECG of 03-Aug-2022 15:10, Significant changes have occurred Confirmed by Nathan [name] ([id number]) on 05/02/2023 11:32:56 AM    Risk Assessment/Calculations:    CHA2DS2-VASc Score = 6   This indicates a 9.7% annual risk of stroke. The patient's score is based upon: CHF History: 1 HTN History: 1 Diabetes History: 1 Stroke History: 0 Vascular Disease History: 1 Age Score: 2 Gender Score: 0     Physical Exam:   / Physical Exam: Blood pressure 134/82, pulse 86, height 6' (1.829 m), weight 163 lb (73.9 kg), SpO2 96%.       GEN:  elderly , somewhat frail man,  NAD  HEENT: Normal NECK: No JVD; No carotid bruits LYMPHATICS: No lymphadenopathy CARDIAC: irreg. Irreg.  Tachycardic , soft systolic murmur  RESPIRATORY:  Clear to auscultation without rales, wheezing or rhonchi  ABDOMEN: Soft, non-tender, non-distended MUSCULOSKELETAL:  No edema; No deformity  SKIN: Warm and dry NEUROLOGIC:   Alert and oriented x 3     ASSESSMENT AND PLAN: .    CAD without angina: History of CABG x 5 in 2002. Most recent cath 09/30/2021 with severe multivessel CAD, patent LIMA-LAD, new 95% near ostial/proximal SVG graft stenosis in the sequential graft supplying the PDA and PLA vessel treated with PCI/DES x 1.  No angina   Chronic combined CHF: Echo 09/29/2021 with LVEF 30-35%, no rwma, mild LVH, indeterminate diastolic function, normal RV.     Hypertension:    BP is well controlled.   Aortic insufficiency:    Mitral regurgitation:    PAF on chronic anticoagulation:  he is in rapid  atrial fib today .   He is off his toprol XL   .  He was told by someone not to take it any longer  Will restart Toprol XL 50 mg a day. I'll see him in 2 weeks for dose titration / possibly set up cardioversion    Dispo: 2 weeks to see me

## 2023-05-02 ENCOUNTER — Ambulatory Visit: Payer: PPO | Attending: Cardiovascular Disease | Admitting: Cardiovascular Disease

## 2023-05-02 ENCOUNTER — Encounter: Payer: Self-pay | Admitting: Cardiovascular Disease

## 2023-05-02 VITALS — BP 134/82 | HR 86 | Ht 72.0 in | Wt 163.0 lb

## 2023-05-02 DIAGNOSIS — I4891 Unspecified atrial fibrillation: Secondary | ICD-10-CM | POA: Diagnosis not present

## 2023-05-02 DIAGNOSIS — I5042 Chronic combined systolic (congestive) and diastolic (congestive) heart failure: Secondary | ICD-10-CM | POA: Diagnosis not present

## 2023-05-02 DIAGNOSIS — I480 Paroxysmal atrial fibrillation: Secondary | ICD-10-CM | POA: Diagnosis not present

## 2023-05-02 DIAGNOSIS — I351 Nonrheumatic aortic (valve) insufficiency: Secondary | ICD-10-CM

## 2023-05-02 DIAGNOSIS — I25119 Atherosclerotic heart disease of native coronary artery with unspecified angina pectoris: Secondary | ICD-10-CM | POA: Diagnosis not present

## 2023-05-02 MED ORDER — METOPROLOL SUCCINATE ER 50 MG PO TB24: 50.0000 mg | ORAL_TABLET | Freq: Every day | ORAL | 3 refills | Status: AC

## 2023-05-02 NOTE — Patient Instructions (Signed)
 Medication Instructions:  START Toprol XL/Metoprolol Succinate 50mg  daily *If you need a refill on your cardiac medications before your next appointment, please call your pharmacy*  Follow-Up: At [facility], you and your health needs are our priority.  As part of our continuing mission to provide you with exceptional heart care, we have created designated Provider Care Teams.  These Care Teams include your primary Cardiologist (physician) and Advanced Practice Providers (APPs -  Physician Assistants and Nurse Practitioners) who all work together to provide you with the care you need, when you need it.  Your next appointment:   As Scheduled  Provider:   Kristeen Miss, MD      1st Floor: - Lobby - Registration  - Pharmacy  - Lab - Cafe  2nd Floor: - PV Lab - Diagnostic Testing (echo, CT, nuclear med)  3rd Floor: - Vacant  4th Floor: - TCTS (cardiothoracic surgery) - AFib Clinic - Structural Heart Clinic - Vascular Surgery  - Vascular Ultrasound  5th Floor: - HeartCare Cardiology (general and EP) - Clinical Pharmacy for coumadin, hypertension, lipid, weight-loss medications, and med management appointments    Valet parking services will be available as well.

## 2023-05-03 ENCOUNTER — Other Ambulatory Visit (HOSPITAL_COMMUNITY): Payer: Self-pay | Admitting: *Deleted

## 2023-05-03 DIAGNOSIS — E1122 Type 2 diabetes mellitus with diabetic chronic kidney disease: Secondary | ICD-10-CM | POA: Diagnosis not present

## 2023-05-03 DIAGNOSIS — N400 Enlarged prostate without lower urinary tract symptoms: Secondary | ICD-10-CM | POA: Diagnosis not present

## 2023-05-03 DIAGNOSIS — E559 Vitamin D deficiency, unspecified: Secondary | ICD-10-CM | POA: Diagnosis not present

## 2023-05-03 DIAGNOSIS — R809 Proteinuria, unspecified: Secondary | ICD-10-CM | POA: Diagnosis not present

## 2023-05-03 DIAGNOSIS — N1832 Chronic kidney disease, stage 3b: Secondary | ICD-10-CM | POA: Diagnosis not present

## 2023-05-03 DIAGNOSIS — E872 Acidosis, unspecified: Secondary | ICD-10-CM | POA: Diagnosis not present

## 2023-05-03 DIAGNOSIS — I129 Hypertensive chronic kidney disease with stage 1 through stage 4 chronic kidney disease, or unspecified chronic kidney disease: Secondary | ICD-10-CM | POA: Diagnosis not present

## 2023-05-03 DIAGNOSIS — I5042 Chronic combined systolic (congestive) and diastolic (congestive) heart failure: Secondary | ICD-10-CM | POA: Diagnosis not present

## 2023-05-04 ENCOUNTER — Ambulatory Visit (HOSPITAL_COMMUNITY)
Admission: RE | Admit: 2023-05-04 | Discharge: 2023-05-04 | Disposition: A | Discharge status: Home / Self Care | Source: Ambulatory Visit | Attending: Nephrology | Admitting: Nephrology

## 2023-05-04 MED ORDER — MAGNESIUM SULFATE 4 GM/100ML IV SOLN
4.0000 g | Freq: Once | INTRAVENOUS | Status: AC
Start: 2023-05-04 — End: 2023-05-04
  Administered 2023-05-04: 4 g via INTRAVENOUS
  Filled 2023-05-04: qty 100

## 2023-05-09 ENCOUNTER — Encounter: Payer: Self-pay | Admitting: Cardiovascular Disease

## 2023-05-09 NOTE — Progress Notes (Unsigned)
 Cardiology Office Note:  .   Date:  05/13/2023  ID:  Nathan [name], DOB 11-27-1933, MRN [id number] PCP: Garlan Fillers, MD  [facility] Prov[facility] Cardiologist:  Kristeen Miss, MD    Patient Profile: .      PMH Coronary artery disease S/p CABG x 5 in 2002 Aortic valve insufficiency Aortic valve stenosis Atrial fibrillation on chronic anticoagulation Chronic combined systolic and diastolic heart failure Severe tricuspid regurgitation Hypertension Hyperlipidemia DVT  Long-time patient of Dr. Elease Hashimoto with history as outlined above. Previously worked at [employer].   Admission March 2022 for gram-negative sepsis with acute hypoxic respiratory failure, peak at bedtime troponin 5012, likely due to demand ischemia.  Echocardiogram 05/02/2020 revealed mildly reduced LVEF 40 to 45%, unable to determine diastolic function, mildly elevated pulmonary artery pressures, severe TR, degenerative MV with mild to moderate mitral stenosis and mild AI.  Was found to have gallstones but was told he was not an operative candidate.  Cardiac monitor 12/30/2020 revealed episodes of atrial flutter with bundle branch block and he was started on metoprolol.  He was also started on Eliquis 2.5 mg twice daily for stroke prevention.  Noted to have severe prostatomegaly during ED admission 03/12/2021 for constipation and vomiting.  Had significant urinary retention > 900 cc.   Admission 2/10-2/13/2023 for syncope.  He reported multiple episodes of syncope over the previous year with concerns for orthostasis.  Antihypertensives were held and Lasix and tamsulosin were stopped with initial improvement.  He had not been wearing compression socks which have been advised.  All episodes of syncope occurred while standing.  He did not have associated symptoms of chest pain, shortness of breath, nausea, diaphoresis, or palpitations.  Symptoms initially improved after BP meds were held and when wearing compression  stockings.  He was discharged on metoprolol to tartrate 12.5 mg twice daily. At follow-up office visit, he admitted he was not monitoring home BP.  Only drinking 2 to 4 cups of water a day.  He continued to live independently with his daughter helping him frequently.  Cardiac monitor completed 04/2021 revealed frequent SVT and metoprolol was increased to 25 mg twice daily.  He admitted to frequently skipping lunch as well.  He was encouraged to improve hydration and nutrition.  His baseline creatinine was felt to be 2.0.  Most recent cardiology clinic visit was 08/03/2022 with Dr. Elease Hashimoto.  He was seen with his daughter [name].  He was under the care of hospice and many of his meds were being held.  He admitted to a little bit more difficulty breathing.  He continued to eat lots of processed foods daily. SCr was 2.13 at that time and was felt to be stable.       History of Present Illness: .   Nathan [name] is a very pleasant 89 y.o. male who is here today for 6 month follow-up. He is here alone today and drove himself here. His daughter had recent knee replacement surgery. He had excision of skin cancer on his left temple which is bandaged. He denies any recent falls or dizziness, which has improved over the past week. He has not noticed any significant hypotension or instability while walking. He denies chest pain, potation's, shortness of breath, orthopnea, PND, edema. Has a urinary catheter, which occasionally causes irritation at the insertion site. He has not noticed any significant bleeding, but reports occasional bright red blood on toilet paper, which he attributes to hemorrhoids. PCP advised him to reduce Lasix and  potassium to 4 days per week due to recent renal function results. He reports drinking an adequate amount of water daily but he does not measure it. He does not have any specific concerns today.    May 02, 2023 Nathan [name] is seen for follow up of his CAD , CHF, atrial fib , orthostatic  hypotension  He is on Eliquis    Has not been exercising much Get short of breath with exertion Has not had to use the NTG   He is in rapid atrial fib today  Does not feel very well but not bad enough to go to the hospital  He said his Toprol XL 100 mg was stopped several weeks and he has run out.  Will restart Toprol XL 50 mg a day  I will see him in 2 weeks for dose titration and possibly set him up for a cardioversion   May 13, 2023 Seen with [name], Daughter   Nathan [name] is seen for follow up of his rapid atrial fib His metoprolol had been stopped. We restarted the metoprolol and brought him back for eval ? Consider cardioversion vs. Continued rate control and anticoagulation   Stays short of breath  No CP     ROS: See HPI       Studies Reviewed: .        Risk Assessment/Calculations:    CHA2DS2-VASc Score = 6   This indicates a 9.7% annual risk of stroke. The patient's score is based upon: CHF History: 1 HTN History: 1 Diabetes History: 1 Stroke History: 0 Vascular Disease History: 1 Age Score: 2 Gender Score: 0     Physical Exam:     Physical Exam: Blood pressure 132/70, pulse (!) 101, height 6' (1.829 m), weight 158 lb 9.6 oz (71.9 kg), SpO2 94%.      GEN:  Well nourished, well developed in no acute distress HEENT: Normal NECK: No JVD; No carotid bruits LYMPHATICS: No lymphadenopathy CARDIAC: irreg. Irreg.   RESPIRATORY:  Clear to auscultation without rales, wheezing or rhonchi  ABDOMEN: Soft, non-tender, non-distended MUSCULOSKELETAL:  No edema; No deformity  SKIN: Warm and dry NEUROLOGIC:  Alert and oriented x 3      ASSESSMENT AND PLAN: .    CAD without angina: History of CABG x 5 in 2002. Most recent cath 09/30/2021 with severe multivessel CAD, patent LIMA-LAD, new 95% near ostial/proximal SVG graft stenosis in the sequential graft supplying the PDA and PLA vessel treated with PCI/DES x 1.  No current angian     Chronic combined CHF:  Echo 09/29/2021 with LVEF 30-35%, no rwma, mild LVH, indeterminate diastolic function, normal RV.   CHf seems stable   Hypertension:    BP is well controlled.   Aortic insufficiency:    Mitral regurgitation:  stable   PAF on chronic anticoagulation:  he is in rapid  atrial fib today .   We started Toprol-XL 50 mg a day at his last visit.  His heart rate is better but still a little bit rapid.  Will increase the Toprol-XL up to 100 mg a day.  7.  Hemorrhoids:   he is in lots of pain with his hemorrhoids.   He will be trying some cream / ointment to see if that helps  If he can have a procedure to help with his discomfort, I have encouraged him to consider it  He would be at moderate risk from a CV standpoint based on his CHF with EV 30-35%.  He would be ok holding his Eliquis for 2-3 days prior to the procedure.           Dispo: 2 weeks to see me

## 2023-05-13 ENCOUNTER — Ambulatory Visit: Attending: Cardiovascular Disease | Admitting: Cardiovascular Disease

## 2023-05-13 ENCOUNTER — Encounter: Payer: Self-pay | Admitting: Cardiovascular Disease

## 2023-05-13 VITALS — BP 132/70 | HR 101 | Ht 72.0 in | Wt 158.6 lb

## 2023-05-13 DIAGNOSIS — I480 Paroxysmal atrial fibrillation: Secondary | ICD-10-CM | POA: Diagnosis not present

## 2023-05-13 DIAGNOSIS — I5022 Chronic systolic (congestive) heart failure: Secondary | ICD-10-CM | POA: Diagnosis not present

## 2023-05-13 NOTE — Patient Instructions (Signed)
 Follow-Up: At [facility], you and your health needs are our priority.  As part of our continuing mission to provide you with exceptional heart care, our providers are all part of one team.  This team includes your primary Cardiologist (physician) and Advanced Practice Providers or APPs (Physician Assistants and Nurse Practitioners) who all work together to provide you with the care you need, when you need it.  Your next appointment:   6 month(s)  Provider:   Tonny Bollman, MD      1st Floor: - Lobby - Registration  - Pharmacy  - Lab - Cafe  2nd Floor: - PV Lab - Diagnostic Testing (echo, CT, nuclear med)  3rd Floor: - Vacant  4th Floor: - TCTS (cardiothoracic surgery) - AFib Clinic - Structural Heart Clinic - Vascular Surgery  - Vascular Ultrasound  5th Floor: - HeartCare Cardiology (general and EP) - Clinical Pharmacy for coumadin, hypertension, lipid, weight-loss medications, and med management appointments    Valet parking services will be available as well.

## 2023-05-19 DIAGNOSIS — R338 Other retention of urine: Secondary | ICD-10-CM | POA: Diagnosis not present

## 2023-06-16 DIAGNOSIS — R338 Other retention of urine: Secondary | ICD-10-CM | POA: Diagnosis not present

## 2023-06-22 DIAGNOSIS — L570 Actinic keratosis: Secondary | ICD-10-CM | POA: Diagnosis not present

## 2023-06-22 DIAGNOSIS — L820 Inflamed seborrheic keratosis: Secondary | ICD-10-CM | POA: Diagnosis not present

## 2023-06-22 DIAGNOSIS — B079 Viral wart, unspecified: Secondary | ICD-10-CM | POA: Diagnosis not present

## 2023-06-22 DIAGNOSIS — L2989 Other pruritus: Secondary | ICD-10-CM | POA: Diagnosis not present

## 2023-06-22 DIAGNOSIS — L538 Other specified erythematous conditions: Secondary | ICD-10-CM | POA: Diagnosis not present

## 2023-06-22 DIAGNOSIS — D0439 Carcinoma in situ of skin of other parts of face: Secondary | ICD-10-CM | POA: Diagnosis not present

## 2023-06-22 DIAGNOSIS — D485 Neoplasm of uncertain behavior of skin: Secondary | ICD-10-CM | POA: Diagnosis not present

## 2023-06-22 DIAGNOSIS — L720 Epidermal cyst: Secondary | ICD-10-CM | POA: Diagnosis not present

## 2023-06-22 DIAGNOSIS — D0462 Carcinoma in situ of skin of left upper limb, including shoulder: Secondary | ICD-10-CM | POA: Diagnosis not present

## 2023-06-22 DIAGNOSIS — D225 Melanocytic nevi of trunk: Secondary | ICD-10-CM | POA: Diagnosis not present

## 2023-06-22 DIAGNOSIS — L821 Other seborrheic keratosis: Secondary | ICD-10-CM | POA: Diagnosis not present

## 2023-06-22 DIAGNOSIS — L814 Other melanin hyperpigmentation: Secondary | ICD-10-CM | POA: Diagnosis not present

## 2023-06-28 DIAGNOSIS — I5042 Chronic combined systolic (congestive) and diastolic (congestive) heart failure: Secondary | ICD-10-CM | POA: Diagnosis not present

## 2023-06-28 DIAGNOSIS — I130 Hypertensive heart and chronic kidney disease with heart failure and stage 1 through stage 4 chronic kidney disease, or unspecified chronic kidney disease: Secondary | ICD-10-CM | POA: Diagnosis not present

## 2023-06-28 DIAGNOSIS — N401 Enlarged prostate with lower urinary tract symptoms: Secondary | ICD-10-CM | POA: Diagnosis not present

## 2023-06-28 DIAGNOSIS — N184 Chronic kidney disease, stage 4 (severe): Secondary | ICD-10-CM | POA: Diagnosis not present

## 2023-06-28 DIAGNOSIS — E7800 Pure hypercholesterolemia, unspecified: Secondary | ICD-10-CM | POA: Diagnosis not present

## 2023-06-28 DIAGNOSIS — E440 Moderate protein-calorie malnutrition: Secondary | ICD-10-CM | POA: Diagnosis not present

## 2023-06-28 DIAGNOSIS — E1121 Type 2 diabetes mellitus with diabetic nephropathy: Secondary | ICD-10-CM | POA: Diagnosis not present

## 2023-07-05 DIAGNOSIS — R82998 Other abnormal findings in urine: Secondary | ICD-10-CM | POA: Diagnosis not present

## 2023-07-05 DIAGNOSIS — I5042 Chronic combined systolic (congestive) and diastolic (congestive) heart failure: Secondary | ICD-10-CM | POA: Diagnosis not present

## 2023-07-05 DIAGNOSIS — E1129 Type 2 diabetes mellitus with other diabetic kidney complication: Secondary | ICD-10-CM | POA: Diagnosis not present

## 2023-07-05 DIAGNOSIS — R42 Dizziness and giddiness: Secondary | ICD-10-CM | POA: Diagnosis not present

## 2023-07-05 DIAGNOSIS — N184 Chronic kidney disease, stage 4 (severe): Secondary | ICD-10-CM | POA: Diagnosis not present

## 2023-07-05 DIAGNOSIS — R0600 Dyspnea, unspecified: Secondary | ICD-10-CM | POA: Diagnosis not present

## 2023-07-05 DIAGNOSIS — Z0000 Encounter for general adult medical examination without abnormal findings: Secondary | ICD-10-CM | POA: Diagnosis not present

## 2023-07-05 DIAGNOSIS — I10 Essential (primary) hypertension: Secondary | ICD-10-CM | POA: Diagnosis not present

## 2023-07-05 DIAGNOSIS — Z794 Long term (current) use of insulin: Secondary | ICD-10-CM | POA: Diagnosis not present

## 2023-07-05 DIAGNOSIS — E1121 Type 2 diabetes mellitus with diabetic nephropathy: Secondary | ICD-10-CM | POA: Diagnosis not present

## 2023-07-05 DIAGNOSIS — I480 Paroxysmal atrial fibrillation: Secondary | ICD-10-CM | POA: Diagnosis not present

## 2023-07-05 DIAGNOSIS — I25709 Atherosclerosis of coronary artery bypass graft(s), unspecified, with unspecified angina pectoris: Secondary | ICD-10-CM | POA: Diagnosis not present

## 2023-07-12 ENCOUNTER — Ambulatory Visit (INDEPENDENT_AMBULATORY_CARE_PROVIDER_SITE_OTHER)

## 2023-07-12 DIAGNOSIS — E118 Type 2 diabetes mellitus with unspecified complications: Secondary | ICD-10-CM | POA: Diagnosis not present

## 2023-07-12 DIAGNOSIS — M2141 Flat foot [pes planus] (acquired), right foot: Secondary | ICD-10-CM | POA: Diagnosis not present

## 2023-07-12 DIAGNOSIS — M2041 Other hammer toe(s) (acquired), right foot: Secondary | ICD-10-CM

## 2023-07-12 DIAGNOSIS — M2142 Flat foot [pes planus] (acquired), left foot: Secondary | ICD-10-CM | POA: Diagnosis not present

## 2023-07-14 ENCOUNTER — Encounter: Payer: Self-pay | Admitting: Podiatry

## 2023-07-14 ENCOUNTER — Ambulatory Visit (INDEPENDENT_AMBULATORY_CARE_PROVIDER_SITE_OTHER): Payer: PPO | Admitting: Podiatry

## 2023-07-14 DIAGNOSIS — M79674 Pain in right toe(s): Secondary | ICD-10-CM | POA: Diagnosis not present

## 2023-07-14 DIAGNOSIS — M79675 Pain in left toe(s): Secondary | ICD-10-CM | POA: Diagnosis not present

## 2023-07-14 DIAGNOSIS — E118 Type 2 diabetes mellitus with unspecified complications: Secondary | ICD-10-CM | POA: Diagnosis not present

## 2023-07-14 DIAGNOSIS — Q828 Other specified congenital malformations of skin: Secondary | ICD-10-CM | POA: Diagnosis not present

## 2023-07-14 DIAGNOSIS — B351 Tinea unguium: Secondary | ICD-10-CM

## 2023-07-14 NOTE — Progress Notes (Signed)
 This patient returns to my office for at risk foot care.  This patient requires this care by a professional since this patient will be at risk due to having diabetes type 2, CKD and chronic insufficiensy.  Patient is taking eliquiss.  This patient is unable to cut nails himself since the patient cannot reach his nails.These nails are painful walking and wearing shoes.  Patient has painful callus left forefoot.  This patient presents for at risk foot care today.  General Appearance  Alert, conversant and in no acute stress.  Vascular  Dorsalis pedis and posterior tibial  pulses are palpable  bilaterally.  Capillary return is within normal limits  bilaterally. Temperature is within normal limits  bilaterally.  Neurologic  Senn-Weinstein monofilament wire test diminished   bilaterally. Muscle power within normal limits bilaterally.  Nails Thick disfigured discolored nails with subungual debris  from hallux to fifth toes bilaterally. No evidence of bacterial infection or drainage bilaterally.  Orthopedic  No limitations of motion  feet .  No crepitus or effusions noted.  Hammer toes second  B/L.  MCJ DJD  B/L. HAV  B/L.  Skin  normotropic skin with no porokeratosis noted bilaterally.  No signs of infections or ulcers noted.  Hemorrhagic callus callus 1st MPJ left foot.   Onychomycosis  Pain in right toes  Pain in left toes  Porokeratosis left foot.  Consent was obtained for treatment procedures.   Mechanical debridement of nails 1-5  bilaterally performed with a nail nipper.  Filed with dremel without incident.  Debride callus with nail nipper  and dremel tool. Padding dispensed for diabetic insole.     Return office visit  10 weeks                Told patient to return for periodic foot care and evaluation due to potential at risk complications.   Ruffin Cotton DPM

## 2023-07-18 DIAGNOSIS — R338 Other retention of urine: Secondary | ICD-10-CM | POA: Diagnosis not present

## 2023-08-01 DIAGNOSIS — N184 Chronic kidney disease, stage 4 (severe): Secondary | ICD-10-CM | POA: Diagnosis not present

## 2023-08-08 DIAGNOSIS — E559 Vitamin D deficiency, unspecified: Secondary | ICD-10-CM | POA: Diagnosis not present

## 2023-08-08 DIAGNOSIS — N185 Chronic kidney disease, stage 5: Secondary | ICD-10-CM | POA: Diagnosis not present

## 2023-08-08 DIAGNOSIS — I120 Hypertensive chronic kidney disease with stage 5 chronic kidney disease or end stage renal disease: Secondary | ICD-10-CM | POA: Diagnosis not present

## 2023-08-08 DIAGNOSIS — E1122 Type 2 diabetes mellitus with diabetic chronic kidney disease: Secondary | ICD-10-CM | POA: Diagnosis not present

## 2023-08-08 DIAGNOSIS — R809 Proteinuria, unspecified: Secondary | ICD-10-CM | POA: Diagnosis not present

## 2023-08-08 DIAGNOSIS — I5042 Chronic combined systolic (congestive) and diastolic (congestive) heart failure: Secondary | ICD-10-CM | POA: Diagnosis not present

## 2023-08-08 DIAGNOSIS — N400 Enlarged prostate without lower urinary tract symptoms: Secondary | ICD-10-CM | POA: Diagnosis not present

## 2023-08-08 DIAGNOSIS — E872 Acidosis, unspecified: Secondary | ICD-10-CM | POA: Diagnosis not present

## 2023-08-09 DIAGNOSIS — Z86007 Personal history of in-situ neoplasm of skin: Secondary | ICD-10-CM | POA: Diagnosis not present

## 2023-08-09 DIAGNOSIS — L570 Actinic keratosis: Secondary | ICD-10-CM | POA: Diagnosis not present

## 2023-08-09 DIAGNOSIS — Z08 Encounter for follow-up examination after completed treatment for malignant neoplasm: Secondary | ICD-10-CM | POA: Diagnosis not present

## 2023-08-09 DIAGNOSIS — B078 Other viral warts: Secondary | ICD-10-CM | POA: Diagnosis not present

## 2023-08-09 DIAGNOSIS — L820 Inflamed seborrheic keratosis: Secondary | ICD-10-CM | POA: Diagnosis not present

## 2023-08-09 DIAGNOSIS — L821 Other seborrheic keratosis: Secondary | ICD-10-CM | POA: Diagnosis not present

## 2023-08-09 DIAGNOSIS — L814 Other melanin hyperpigmentation: Secondary | ICD-10-CM | POA: Diagnosis not present

## 2023-08-09 DIAGNOSIS — L538 Other specified erythematous conditions: Secondary | ICD-10-CM | POA: Diagnosis not present

## 2023-08-15 NOTE — Progress Notes (Signed)

## 2023-08-16 DIAGNOSIS — R338 Other retention of urine: Secondary | ICD-10-CM | POA: Diagnosis not present

## 2023-09-08 ENCOUNTER — Telehealth: Payer: Self-pay | Admitting: Cardiovascular Disease

## 2023-09-08 DIAGNOSIS — I480 Paroxysmal atrial fibrillation: Secondary | ICD-10-CM

## 2023-09-08 MED ORDER — APIXABAN 2.5 MG PO TABS: 2.5000 mg | ORAL_TABLET | Freq: Two times a day (BID) | ORAL | 1 refills | Status: DC

## 2023-09-08 NOTE — Telephone Encounter (Signed)
 Prescription refill request for Eliquis  received. Indication:Afib  Last office visit: 05/13/23 (Nahser)  Scr: 3.23 (02/03/23)  Age: 89 Weight: 71.9kg  Appropriate dose. Refill sent.

## 2023-09-08 NOTE — Telephone Encounter (Signed)
 Previous patient of Dr. Alveta, he is currently scheduled to see Dr. Court but requesting to see Dr. Wonda since that is who his wife used to see. Dr. Wonda, would you like to see this patient?

## 2023-09-08 NOTE — Telephone Encounter (Signed)
 Patient will be out of medication by the weekend   *STAT* If patient is at the pharmacy, call can be transferred to refill team.   1. Which medications need to be refilled? (please list name of each medication and dose if known) ELIQUIS  2.5 MG TABS tablet    2. Would you like to learn more about the convenience, safety, & potential cost savings by using the [facility]? NO    3. Are you open to using the [facility] NO   4. Which pharmacy/location (including street and city if local pharmacy) is medication to be sent to?  [id number] - [place], [place] - [address]     5. Do they need a 30 day or 90 day supply? 90

## 2023-09-12 NOTE — Telephone Encounter (Signed)
 LVM to cancel appt with Dr. Court and schedule with Dr. Wonda per approved provider switch request.

## 2023-09-13 DIAGNOSIS — R338 Other retention of urine: Secondary | ICD-10-CM | POA: Diagnosis not present

## 2023-09-19 DIAGNOSIS — N185 Chronic kidney disease, stage 5: Secondary | ICD-10-CM | POA: Diagnosis not present

## 2023-09-22 ENCOUNTER — Ambulatory Visit: Admitting: Podiatry

## 2023-09-22 ENCOUNTER — Encounter: Payer: Self-pay | Admitting: Podiatry

## 2023-09-22 DIAGNOSIS — D689 Coagulation defect, unspecified: Secondary | ICD-10-CM

## 2023-09-22 DIAGNOSIS — Q828 Other specified congenital malformations of skin: Secondary | ICD-10-CM

## 2023-09-22 DIAGNOSIS — M79674 Pain in right toe(s): Secondary | ICD-10-CM

## 2023-09-22 DIAGNOSIS — M79675 Pain in left toe(s): Secondary | ICD-10-CM

## 2023-09-22 DIAGNOSIS — E118 Type 2 diabetes mellitus with unspecified complications: Secondary | ICD-10-CM

## 2023-09-22 DIAGNOSIS — B351 Tinea unguium: Secondary | ICD-10-CM

## 2023-09-22 NOTE — Progress Notes (Signed)
 This patient returns to my office for at risk foot care.  This patient requires this care by a professional since this patient will be at risk due to having diabetes type 2, CKD and chronic insufficiensy.  Patient is taking eliquiss.  This patient is unable to cut nails himself since the patient cannot reach his nails.These nails are painful walking and wearing shoes.  Patient has painful callus left forefoot.  This patient presents for at risk foot care today.  General Appearance  Alert, conversant and in no acute stress.  Vascular  Dorsalis pedis and posterior tibial  pulses are palpable  bilaterally.  Capillary return is within normal limits  bilaterally. Temperature is within normal limits  bilaterally.  Neurologic  Senn-Weinstein monofilament wire test diminished   bilaterally. Muscle power within normal limits bilaterally.  Nails Thick disfigured discolored nails with subungual debris  from hallux to fifth toes bilaterally. No evidence of bacterial infection or drainage bilaterally.  Orthopedic  No limitations of motion  feet .  No crepitus or effusions noted.  Hammer toes second  B/L.  MCJ DJD  B/L. HAV  B/L.  Skin  normotropic skin with no porokeratosis noted bilaterally.  No signs of infections or ulcers noted.  Hemorrhagic callus callus 1st MPJ left foot.   Onychomycosis  Pain in right toes  Pain in left toes  Porokeratosis left foot.  Consent was obtained for treatment procedures.   Mechanical debridement of nails 1-5  bilaterally performed with a nail nipper.  Filed with dremel without incident.  Debride callus with nail nipper  and dremel tool. Padding dispensed for diabetic insole.     Return office visit  10 weeks                Told patient to return for periodic foot care and evaluation due to potential at risk complications.   Ruffin Cotton DPM

## 2023-09-28 DIAGNOSIS — N401 Enlarged prostate with lower urinary tract symptoms: Secondary | ICD-10-CM | POA: Diagnosis not present

## 2023-09-28 DIAGNOSIS — R338 Other retention of urine: Secondary | ICD-10-CM | POA: Diagnosis not present

## 2023-09-30 DIAGNOSIS — E1122 Type 2 diabetes mellitus with diabetic chronic kidney disease: Secondary | ICD-10-CM | POA: Diagnosis not present

## 2023-09-30 DIAGNOSIS — N185 Chronic kidney disease, stage 5: Secondary | ICD-10-CM | POA: Diagnosis not present

## 2023-09-30 DIAGNOSIS — I120 Hypertensive chronic kidney disease with stage 5 chronic kidney disease or end stage renal disease: Secondary | ICD-10-CM | POA: Diagnosis not present

## 2023-09-30 DIAGNOSIS — I5042 Chronic combined systolic (congestive) and diastolic (congestive) heart failure: Secondary | ICD-10-CM | POA: Diagnosis not present

## 2023-09-30 DIAGNOSIS — N400 Enlarged prostate without lower urinary tract symptoms: Secondary | ICD-10-CM | POA: Diagnosis not present

## 2023-09-30 DIAGNOSIS — E872 Acidosis, unspecified: Secondary | ICD-10-CM | POA: Diagnosis not present

## 2023-09-30 DIAGNOSIS — E559 Vitamin D deficiency, unspecified: Secondary | ICD-10-CM | POA: Diagnosis not present

## 2023-09-30 DIAGNOSIS — R809 Proteinuria, unspecified: Secondary | ICD-10-CM | POA: Diagnosis not present

## 2023-10-12 DIAGNOSIS — R338 Other retention of urine: Secondary | ICD-10-CM | POA: Diagnosis not present

## 2023-11-03 ENCOUNTER — Encounter: Payer: Self-pay | Admitting: Cardiovascular Disease

## 2023-11-03 ENCOUNTER — Ambulatory Visit: Attending: Cardiovascular Disease | Admitting: Cardiovascular Disease

## 2023-11-03 VITALS — BP 128/68 | HR 59 | Ht 72.0 in | Wt 162.8 lb

## 2023-11-03 DIAGNOSIS — I480 Paroxysmal atrial fibrillation: Secondary | ICD-10-CM | POA: Diagnosis not present

## 2023-11-03 DIAGNOSIS — I5020 Unspecified systolic (congestive) heart failure: Secondary | ICD-10-CM | POA: Diagnosis not present

## 2023-11-03 DIAGNOSIS — I25119 Atherosclerotic heart disease of native coronary artery with unspecified angina pectoris: Secondary | ICD-10-CM

## 2023-11-03 NOTE — Progress Notes (Signed)
 Cardiology Office Note:    Date:  11/03/2023   ID:  Unknown Getting, DOB Jan 23, 1934, MRN [id number]  PCP:  Yolande Toribio MATSU, MD   [facility] Providers Cardiologist:  Ozell Fell, MD     Referring MD: Yolande Toribio MATSU, MD   Chief Complaint  Patient presents with   Atrial Fibrillation    History of Present Illness:    Nathan [name] is a 89 y.o. male with a hx of:  Coronary artery disease S/p CABG x 5 in 2002 Aortic valve insufficiency Aortic valve stenosis Atrial fibrillation on chronic anticoagulation Chronic combined systolic and diastolic heart failure Severe tricuspid regurgitation Hypertension Hyperlipidemia DVT  The patient has been a longstanding patient of Dr. Allena.  He has had some problems with recurrent syncope.  As above, he has a history of remote CABG and underwent cardiac catheterization in 2023 showing patency of his LIMA to LAD graft but severe stenosis of the sequential vein graft to the PDA and PLA vessels treated with stenting.  His LVEF is 30 to 35%.  When he was last seen in March 2025 he had rapid atrial fibrillation and he was started on metoprolol  succinate and then this was increased at follow-up to a dose of 100 mg daily to try to better control his heart rate.  The patient is here with his daughter today.  Reports no major changes in his cardiac status since he was seen last by Dr. Alveta.  Denies heart palpitations or chest pain.  He did have an episode of indigestion and some pain in his elbows but not clear to me after our discussion today if this represents anginal pain.  It has not recurred since the initial episode.  He denies orthopnea, PND, or leg swelling.  He does have shortness of breath with activity.  Current Medications: Current Meds  Medication Sig   apixaban  (ELIQUIS ) 2.5 MG TABS tablet Take 1 tablet (2.5 mg total) by mouth 2 (two) times daily.   Blood Glucose Monitoring Suppl (ONETOUCH VERIO) w/Device KIT    [employer] D3  25 MCG (1000 UT) capsule Take 1,000 Units by mouth daily.   furosemide  (LASIX ) 80 MG tablet TAKE 1 TABLET DAILY AS NEEDED FOR FLUID OR EDEMA (IF YOU NOTICE SHORTNESS OF BREATH , LEG SWELLING). (Patient taking differently: TAKE 1 TABLET DAILY AS NEEDED FOR FLUID OR EDEMA (IF YOU NOTICE SHORTNESS OF BREATH , LEG SWELLING). M, W, F, & SAT)   hydrALAZINE  (APRESOLINE ) 25 MG tablet 1 tablet with food Orally Three times daily for 90 days   insulin  NPH-regular Human (NOVOLIN 70/30) (70-30) 100 UNIT/ML injection Inject 10-20 Units into the skin See admin instructions. 10 units in the morning & 20 units at dinner Sliding Scale if BS>150   magnesium  oxide (MAG-OX) 400 MG tablet Take 400 mg by mouth daily.   megestrol  (MEGACE ) 20 MG tablet Take 20 mg by mouth daily.   metoprolol  succinate (TOPROL -XL) 50 MG 24 hr tablet Take 1 tablet (50 mg total) by mouth daily. Take with or immediately following a meal.   Multiple Vitamins-Minerals (CENTRUM SILVER PO) Take 1 tablet by mouth daily.   nitroGLYCERIN  (NITROSTAT ) 0.4 MG SL tablet Place 1 tablet (0.4 mg total) under the tongue every 5 (five) minutes as needed for chest pain.   omeprazole (PRILOSEC) 20 MG capsule Take 20 mg by mouth 2 (two) times daily.    ONETOUCH VERIO test strip    RELION INSULIN  SYRINGE 1ML/31G 31G X 5/16 1 ML MISC  rosuvastatin  (CRESTOR ) 40 MG tablet Take 1 tablet (40 mg total) by mouth daily.   sodium bicarbonate  650 MG tablet Take 650 mg by mouth 2 (two) times daily.     Allergies:   Flomax  [tamsulosin  hcl], Lasix  [furosemide ], and Silodosin   ROS:   Please see the history of present illness.    All other systems reviewed and are negative.  EKGs/Labs/Other Studies Reviewed:    The following studies were reviewed today: Cardiac Studies & Procedures   ______________________________________________________________________________________________ CARDIAC CATHETERIZATION  CARDIAC CATHETERIZATION 10/02/2021  Conclusion   Origin  lesion before RPDA  is 95% stenosed.   A drug-eluting stent was successfully placed using a SYNERGY XD 3.50X16.   Post intervention, there is a 0% residual stenosis.  Successful PCI of SVG to RCA with DES x 1  Plan: DAPT with ASA 81 mg for one month and Plavix  12 months. If no bleeding issues plan to resume Eliquis  2.5 mg bid tomorrow.  Findings Coronary Findings Diagnostic  Dominance: Right  Left Main Mid LM to Dist LM lesion is 95% stenosed.  Left Anterior Descending Prox LAD lesion is 95% stenosed. Prox LAD to Mid LAD lesion is 99% stenosed. Mid LAD lesion is 70% stenosed. Mid LAD to Dist LAD lesion is 70% stenosed.  Left Circumflex Ost Cx to Prox Cx lesion is 95% stenosed.  First Obtuse Marginal Branch Vessel is small in size. 1st Mrg lesion is 99% stenosed.  Right Coronary Artery Prox RCA to Mid RCA lesion is 100% stenosed.  Graft To 1st Mrg Origin to Prox Graft lesion is 100% stenosed.  Graft To 1st Diag Origin lesion is 100% stenosed.  Sequential Graft To RPDA, 3rd RPL Origin lesion before RPDA  is 95% stenosed.  LIMA Graft To Mid LAD  Intervention  Origin lesion before RPDA (Sequential Graft To RPDA, 3rd RPL) Stent CATHETER LAUNCHER 6FR MP1 guide catheter was inserted. Lesion crossed with guidewire using a WIRE ASAHI PROWATER 180CM. Pre-stent angioplasty was performed using a BALLN SAPPHIRE 2.0X12. A drug-eluting stent was successfully placed using a SYNERGY XD 3.50X16. Stent strut is well apposed. Post-stent angioplasty was performed using a BALL SAPPHIRE NC24 4.0X10. Maximum pressure:  20 atm. Post-Intervention Lesion Assessment The intervention was successful. Pre-interventional TIMI flow is 3. Post-intervention TIMI flow is 3. No complications occurred at this lesion. There is a 0% residual stenosis post intervention.   CARDIAC CATHETERIZATION 09/30/2021  Conclusion   Mid LM to Dist LM lesion is 95% stenosed.   Prox LAD lesion is 95% stenosed.   Ost  Cx to Prox Cx lesion is 95% stenosed.   Prox LAD to Mid LAD lesion is 99% stenosed.   1st Mrg lesion is 99% stenosed.   Prox RCA to Mid RCA lesion is 100% stenosed.   Origin to Prox Graft lesion is 100% stenosed.   Origin lesion is 100% stenosed.   Origin lesion before RPDA  is 95% stenosed.   Mid LAD lesion is 70% stenosed.   Mid LAD to Dist LAD lesion is 70% stenosed.  Severe multivessel CAD with 95% distal left main stenosis, 95 to 99% proximal LAD stenosis; 95-99% proximal circumflex stenosis before a diminutive OM1 vessel with 99% stenosis; and total occlusion of the proximal RCA after a proximal branch.  Patent LIMA to mid LAD.  Old occlusion of the SVG which had supplied the diagonal vessel.  Old occlusion of the SVG which had supplied the circumflex marginal vessel.  New 95% near ostial/proximal SVG graft stenosis in  the sequential graft supplying the PDA and PLA vessel.  LV EDP 6 mmHg.  RECOMMENDATION: Patient recently presented with increasing chest pain.  Troponins have increased to 2635 most likely due to the new subtotal near ostial/proximal graft stenosis in the sequential graft supplying the PDA and PLA vessels.  The patient will be hydrated post procedure with his stage IV CKD.  We will have colleagues review and most likely will require attempt at staged PCI to the SVG supplying the PDA and PLA system of the RCA.  Findings Coronary Findings Diagnostic  Dominance: Right  Left Main Mid LM to Dist LM lesion is 95% stenosed.  Left Anterior Descending Prox LAD lesion is 95% stenosed. Prox LAD to Mid LAD lesion is 99% stenosed. Mid LAD lesion is 70% stenosed. Mid LAD to Dist LAD lesion is 70% stenosed.  Left Circumflex Ost Cx to Prox Cx lesion is 95% stenosed.  First Obtuse Marginal Branch Vessel is small in size. 1st Mrg lesion is 99% stenosed.  Right Coronary Artery Prox RCA to Mid RCA lesion is 100% stenosed.  Graft To 1st Mrg Origin to Prox Graft lesion  is 100% stenosed.  Graft To 1st Diag Origin lesion is 100% stenosed.  Sequential Graft To RPDA, 3rd RPL Origin lesion before RPDA  is 95% stenosed.  LIMA Graft To Mid LAD  Intervention  No interventions have been documented.   STRESS TESTS  NM MYOCAR MULTI W/SPECT W 01/23/2015  Narrative  There was no ST segment deviation noted during stress.  The study is normal.  This is a low risk study.  The left ventricular ejection fraction is normal (55-65%).  There is attenuation in basal and mid inferolateral and anterolateral stress and rest images and no evidence of ischemia. Overall LVEF 65%.   ECHOCARDIOGRAM  ECHOCARDIOGRAM COMPLETE 09/29/2021  Narrative ECHOCARDIOGRAM REPORT    Patient Name:   TEMITOPE GRIFFING Date of Exam: 09/29/2021 Medical Rec #:  [id number]    Height:       71.0 in Accession #:    [id number]   Weight:       156.3 lb Date of Birth:  Jul 22, 1933    BSA:          1.899 m Patient Age:    87 years     BP:           153/80 mmHg Patient Gender: M            HR:           78 bpm. Exam Location:  Inpatient  Procedure: 2D Echo, Cardiac Doppler, Color Doppler and Intracardiac Opacification Agent  Indications:    CHF-acute systolic  History:        Patient has prior history of Echocardiogram examinations, most recent 04/26/2018. CHF, CAD, Prior CABG, Aortic Valve Disease and Mitral Valve Disease, Arrythmias:Atrial Fibrillation; Risk Factors:Diabetes and Dyslipidemia. Elevated troponin. CKD.  Sonographer:    Rome Eans RDCS (AE) Referring Phys: [id number] [facility] A SMITH   Sonographer Comments: No subcostal window. IMPRESSIONS   1. Left ventricular ejection fraction, by estimation, is 30 to 35%. The left ventricle has moderately decreased function. The left ventricle demonstrates regional wall motion abnormalities (see scoring diagram/findings for description). The left ventricular internal cavity size was mildly dilated. There is mild concentric left  ventricular hypertrophy. Indeterminate diastolic filling due to E-A fusion. 2. Right ventricular systolic function is normal. The right ventricular size is normal. 3. Left atrial size was severely dilated. 4. The mitral valve  is abnormal. Moderate to severe mitral valve regurgitation. No evidence of mitral stenosis. 5. The aortic valve is tricuspid. There is mild calcification of the aortic valve. There is mild thickening of the aortic valve. Aortic valve regurgitation is mild. Aortic valve sclerosis is present, with no evidence of aortic valve stenosis.  Comparison(s): Changes from prior study are noted. The left ventricular function is worsened.  FINDINGS Left Ventricle: Left ventricular ejection fraction, by estimation, is 30 to 35%. The left ventricle has moderately decreased function. The left ventricle demonstrates regional wall motion abnormalities. Definity  contrast agent was given IV to delineate the left ventricular endocardial borders. The left ventricular internal cavity size was mildly dilated. There is mild concentric left ventricular hypertrophy. Indeterminate diastolic filling due to E-A fusion.   LV Wall Scoring: The entire lateral wall is akinetic.  Right Ventricle: The right ventricular size is normal. No increase in right ventricular wall thickness. Right ventricular systolic function is normal.  Left Atrium: Left atrial size was severely dilated.  Right Atrium: Right atrial size was normal in size.  Pericardium: There is no evidence of pericardial effusion.  Mitral Valve: The mitral valve is abnormal. Moderate to severe mitral valve regurgitation. No evidence of mitral valve stenosis.  Tricuspid Valve: The tricuspid valve is grossly normal. Tricuspid valve regurgitation is mild . No evidence of tricuspid stenosis.  Aortic Valve: The aortic valve is tricuspid. There is mild calcification of the aortic valve. There is mild thickening of the aortic valve. Aortic valve  regurgitation is mild. Aortic regurgitation PHT measures 333 msec. Aortic valve sclerosis is present, with no evidence of aortic valve stenosis. Aortic valve mean gradient measures 2.0 mmHg. Aortic valve peak gradient measures 3.2 mmHg. Aortic valve area, by VTI measures 2.79 cm.  Pulmonic Valve: The pulmonic valve was grossly normal. Pulmonic valve regurgitation is not visualized. No evidence of pulmonic stenosis.  Aorta: The aortic root and ascending aorta are structurally normal, with no evidence of dilitation.  Venous: The inferior vena cava was not well visualized.  IAS/Shunts: The atrial septum is grossly normal.   LEFT VENTRICLE PLAX 2D LVIDd:         5.00 cm LVIDs:         4.50 cm LV PW:         1.10 cm LV IVS:        1.20 cm LVOT diam:     2.10 cm LV SV:         50 LV SV Index:   26 LVOT Area:     3.46 cm  LV Volumes (MOD) LV vol d, MOD A4C: 168.0 ml LV vol s, MOD A4C: 131.0 ml LV SV MOD A4C:     168.0 ml  RIGHT VENTRICLE RV S prime:     7.43 cm/s TAPSE (M-mode): 1.1 cm  LEFT ATRIUM              Index        RIGHT ATRIUM           Index LA diam:        5.20 cm  2.74 cm/m   RA Area:     12.00 cm LA Vol (A2C):   94.7 ml  49.87 ml/m  RA Volume:   23.30 ml  12.27 ml/m LA Vol (A4C):   102.0 ml 53.72 ml/m LA Biplane Vol: 100.0 ml 52.66 ml/m AORTIC VALVE AV Area (Vmax):    2.75 cm AV Area (Vmean):   2.66 cm AV Area (  VTI):     2.79 cm AV Vmax:           89.80 cm/s AV Vmean:          63.100 cm/s AV VTI:            0.179 m AV Peak Grad:      3.2 mmHg AV Mean Grad:      2.0 mmHg LVOT Vmax:         71.30 cm/s LVOT Vmean:        48.500 cm/s LVOT VTI:          0.144 m LVOT/AV VTI ratio: 0.80 AI PHT:            333 msec  AORTA Ao Root diam: 3.90 cm Ao Asc diam:  4.00 cm  TRICUSPID VALVE TR Peak grad:   15.8 mmHg TR Vmax:        199.00 cm/s  SHUNTS Systemic VTI:  0.14 m Systemic Diam: 2.10 cm  Darryle Decent MD Electronically signed by Darryle Decent  MD Signature Date/Time: 09/29/2021/6:53:37 PM    Final    MONITORS  CARDIAC EVENT MONITOR 04/09/2021  Narrative  Sinus rhythm including sinus tach, NSR, sinus bradycardia  Frequent episodes of SVT - HR up to 179  Occasional episodes of atrial fib with RVR  Rare episoes of nonsustained VT  The episodes of dizziness occasionally correlated with his SVT but other episodes of dizziness were during sinus rhythm.       ______________________________________________________________________________________________      EKG:        Recent Labs: 02/03/2023: BUN 60; Creatinine, Ser 3.23; Potassium 4.7; Sodium 138  Recent Lipid Panel    Component Value Date/Time   CHOL 129 09/30/2021 0316   CHOL 134 06/26/2019 0811   TRIG 106 09/30/2021 0316   HDL 40 (L) 09/30/2021 0316   HDL 38 (L) 06/26/2019 0811   CHOLHDL 3.2 09/30/2021 0316   VLDL 21 09/30/2021 0316   LDLCALC 68 09/30/2021 0316   LDLCALC 69 06/26/2019 0811   LDLDIRECT 77.1 01/16/2013 1607     Risk Assessment/Calculations:    CHA2DS2-VASc Score = 6   This indicates a 9.7% annual risk of stroke. The patient's score is based upon: CHF History: 1 HTN History: 1 Diabetes History: 1 Stroke History: 0 Vascular Disease History: 1 Age Score: 2 Gender Score: 0               Physical Exam:    VS:  BP 128/68   Pulse (!) 59   Ht 6' (1.829 m)   Wt 162 lb 12.8 oz (73.8 kg)   SpO2 100%   BMI 22.08 kg/m     Wt Readings from Last 3 Encounters:  11/03/23 162 lb 12.8 oz (73.8 kg)  05/13/23 158 lb 9.6 oz (71.9 kg)  05/02/23 163 lb (73.9 kg)     GEN: Thin elderly male in no acute distress HEENT: Normal NECK: No JVD; No carotid bruits LYMPHATICS: No lymphadenopathy CARDIAC: RRR, 2/6 holosystolic murmur at the apex RESPIRATORY:  Clear to auscultation without rales, wheezing or rhonchi  ABDOMEN: Soft, non-tender, non-distended MUSCULOSKELETAL:  No edema; No deformity  SKIN: Warm and dry NEUROLOGIC:  Alert  and oriented x 3 PSYCHIATRIC:  Normal affect   Assessment & Plan Coronary artery disease involving native coronary artery of native heart with angina pectoris [facility]) Patient appears clinically stable.  He has advanced kidney disease and has been recommended for hospice/palliative care.  He initially has declined  this and we had further discussion today about the type of support that could be provided by hospice.  He is not a candidate for dialysis.  He is not a candidate for any further invasive cardiac testing.  Supportive care is indicated.  I think it is appropriate that he remain on apixaban  for anticoagulation, metoprolol  succinate, and rosuvastatin .  I will see him back in 6 months. Paroxysmal atrial fibrillation (HCC) The patient's heart rate is much better controlled today.  He will continue on metoprolol  for rate control and apixaban  for anticoagulation.  Appears stable.  No bleeding problems reported. HFrEF (heart failure with reduced ejection fraction) (HCC) Patient with LVEF 30 to 35%.  With his advanced kidney disease he is not a candidate for most of our GDMT.  He will remain on metoprolol  succinate.  Known to have moderately severe mitral regurgitation.  Palliative approach to care noted.  Continue current management.            Medication Adjustments/Labs and Tests Ordered: Current medicines are reviewed at length with the patient today.  Concerns regarding medicines are outlined above.  No orders of the defined types were placed in this encounter.  No orders of the defined types were placed in this encounter.   Patient Instructions  Follow-Up: At [facility], you and your health needs are our priority.  As part of our continuing mission to provide you with exceptional heart care, our providers are all part of one team.  This team includes your primary Cardiologist (physician) and Advanced Practice Providers or APPs (Physician Assistants and Nurse Practitioners) who  all work together to provide you with the care you need, when you need it.  Your next appointment:   6 month(s)  Provider:   Ozell Fell, MD             Signed, Ozell Fell, MD  11/03/2023 10:37 AM    [facility]

## 2023-11-03 NOTE — Assessment & Plan Note (Signed)
 Patient appears clinically stable.  He has advanced kidney disease and has been recommended for hospice/palliative care.  He initially has declined this and we had further discussion today about the type of support that could be provided by hospice.  He is not a candidate for dialysis.  He is not a candidate for any further invasive cardiac testing.  Supportive care is indicated.  I think it is appropriate that he remain on apixaban  for anticoagulation, metoprolol  succinate, and rosuvastatin .  I will see him back in 6 months.

## 2023-11-03 NOTE — Assessment & Plan Note (Signed)
 Patient with LVEF 30 to 35%.  With his advanced kidney disease he is not a candidate for most of our GDMT.  He will remain on metoprolol  succinate.  Known to have moderately severe mitral regurgitation.  Palliative approach to care noted.  Continue current management.

## 2023-11-03 NOTE — Assessment & Plan Note (Signed)
 The patient's heart rate is much better controlled today.  He will continue on metoprolol  for rate control and apixaban  for anticoagulation.  Appears stable.  No bleeding problems reported.

## 2023-11-03 NOTE — Patient Instructions (Signed)
 Follow-Up: At [facility], you and your health needs are our priority.  As part of our continuing mission to provide you with exceptional heart care, our providers are all part of one team.  This team includes your primary Cardiologist (physician) and Advanced Practice Providers or APPs (Physician Assistants and Nurse Practitioners) who all work together to provide you with the care you need, when you need it.  Your next appointment:   6 month(s)  Provider:   Arnoldo Lapping, MD

## 2023-11-07 ENCOUNTER — Ambulatory Visit: Admitting: Cardiovascular Disease

## 2023-11-10 DIAGNOSIS — R338 Other retention of urine: Secondary | ICD-10-CM | POA: Diagnosis not present

## 2023-11-18 DIAGNOSIS — L538 Other specified erythematous conditions: Secondary | ICD-10-CM | POA: Diagnosis not present

## 2023-11-18 DIAGNOSIS — L2989 Other pruritus: Secondary | ICD-10-CM | POA: Diagnosis not present

## 2023-11-18 DIAGNOSIS — Z86007 Personal history of in-situ neoplasm of skin: Secondary | ICD-10-CM | POA: Diagnosis not present

## 2023-11-18 DIAGNOSIS — C44619 Basal cell carcinoma of skin of left upper limb, including shoulder: Secondary | ICD-10-CM | POA: Diagnosis not present

## 2023-11-18 DIAGNOSIS — L570 Actinic keratosis: Secondary | ICD-10-CM | POA: Diagnosis not present

## 2023-11-18 DIAGNOSIS — L821 Other seborrheic keratosis: Secondary | ICD-10-CM | POA: Diagnosis not present

## 2023-11-18 DIAGNOSIS — D485 Neoplasm of uncertain behavior of skin: Secondary | ICD-10-CM | POA: Diagnosis not present

## 2023-11-18 DIAGNOSIS — Z85828 Personal history of other malignant neoplasm of skin: Secondary | ICD-10-CM | POA: Diagnosis not present

## 2023-11-18 DIAGNOSIS — L820 Inflamed seborrheic keratosis: Secondary | ICD-10-CM | POA: Diagnosis not present

## 2023-11-18 DIAGNOSIS — L578 Other skin changes due to chronic exposure to nonionizing radiation: Secondary | ICD-10-CM | POA: Diagnosis not present

## 2023-11-18 DIAGNOSIS — Z08 Encounter for follow-up examination after completed treatment for malignant neoplasm: Secondary | ICD-10-CM | POA: Diagnosis not present

## 2023-11-28 ENCOUNTER — Encounter: Payer: Self-pay | Admitting: Podiatry

## 2023-11-28 ENCOUNTER — Ambulatory Visit: Admitting: Podiatry

## 2023-11-28 DIAGNOSIS — Q828 Other specified congenital malformations of skin: Secondary | ICD-10-CM | POA: Diagnosis not present

## 2023-11-28 DIAGNOSIS — M79675 Pain in left toe(s): Secondary | ICD-10-CM | POA: Diagnosis not present

## 2023-11-28 DIAGNOSIS — B351 Tinea unguium: Secondary | ICD-10-CM | POA: Diagnosis not present

## 2023-11-28 DIAGNOSIS — E118 Type 2 diabetes mellitus with unspecified complications: Secondary | ICD-10-CM | POA: Diagnosis not present

## 2023-11-28 DIAGNOSIS — M79674 Pain in right toe(s): Secondary | ICD-10-CM | POA: Diagnosis not present

## 2023-11-28 NOTE — Progress Notes (Signed)
 This patient returns to my office for at risk foot care.  This patient requires this care by a professional since this patient will be at risk due to having diabetes type 2, CKD and chronic insufficiensy.  Patient is taking eliquiss.  This patient is unable to cut nails himself since the patient cannot reach his nails.These nails are painful walking and wearing shoes.  Patient has painful callus left forefoot.  This patient presents for at risk foot care today.  General Appearance  Alert, conversant and in no acute stress.  Vascular  Dorsalis pedis and posterior tibial  pulses are palpable  bilaterally.  Capillary return is within normal limits  bilaterally. Temperature is within normal limits  bilaterally.  Neurologic  Senn-Weinstein monofilament wire test diminished   bilaterally. Muscle power within normal limits bilaterally.  Nails Thick disfigured discolored nails with subungual debris  from hallux to fifth toes bilaterally. No evidence of bacterial infection or drainage bilaterally.  Orthopedic  No limitations of motion  feet .  No crepitus or effusions noted.  Hammer toes second  B/L.  MCJ DJD  B/L. HAV  B/L.  Skin  normotropic skin with no porokeratosis noted bilaterally.  No signs of infections or ulcers noted.  Hemorrhagic callus callus 1st MPJ left foot.   Onychomycosis  Pain in right toes  Pain in left toes  Porokeratosis left foot.  Consent was obtained for treatment procedures.   Mechanical debridement of nails 1-5  bilaterally performed with a nail nipper.  Filed with dremel without incident.  Debride callus with nail nipper  and dremel tool. Padding dispensed for diabetic insole.     Return office visit  10 weeks                Told patient to return for periodic foot care and evaluation due to potential at risk complications.   Ruffin Cotton DPM

## 2023-12-12 DIAGNOSIS — R338 Other retention of urine: Secondary | ICD-10-CM | POA: Diagnosis not present

## 2023-12-23 DIAGNOSIS — L570 Actinic keratosis: Secondary | ICD-10-CM | POA: Diagnosis not present

## 2023-12-23 DIAGNOSIS — C44619 Basal cell carcinoma of skin of left upper limb, including shoulder: Secondary | ICD-10-CM | POA: Diagnosis not present

## 2023-12-23 DIAGNOSIS — N185 Chronic kidney disease, stage 5: Secondary | ICD-10-CM | POA: Diagnosis not present

## 2023-12-23 DIAGNOSIS — L538 Other specified erythematous conditions: Secondary | ICD-10-CM | POA: Diagnosis not present

## 2023-12-23 DIAGNOSIS — L820 Inflamed seborrheic keratosis: Secondary | ICD-10-CM | POA: Diagnosis not present

## 2023-12-28 DIAGNOSIS — R338 Other retention of urine: Secondary | ICD-10-CM | POA: Diagnosis not present

## 2023-12-28 DIAGNOSIS — N401 Enlarged prostate with lower urinary tract symptoms: Secondary | ICD-10-CM | POA: Diagnosis not present

## 2023-12-30 DIAGNOSIS — E1122 Type 2 diabetes mellitus with diabetic chronic kidney disease: Secondary | ICD-10-CM | POA: Diagnosis not present

## 2023-12-30 DIAGNOSIS — I120 Hypertensive chronic kidney disease with stage 5 chronic kidney disease or end stage renal disease: Secondary | ICD-10-CM | POA: Diagnosis not present

## 2023-12-30 DIAGNOSIS — N400 Enlarged prostate without lower urinary tract symptoms: Secondary | ICD-10-CM | POA: Diagnosis not present

## 2023-12-30 DIAGNOSIS — N185 Chronic kidney disease, stage 5: Secondary | ICD-10-CM | POA: Diagnosis not present

## 2023-12-30 DIAGNOSIS — I5042 Chronic combined systolic (congestive) and diastolic (congestive) heart failure: Secondary | ICD-10-CM | POA: Diagnosis not present

## 2023-12-30 DIAGNOSIS — E872 Acidosis, unspecified: Secondary | ICD-10-CM | POA: Diagnosis not present

## 2023-12-30 DIAGNOSIS — R809 Proteinuria, unspecified: Secondary | ICD-10-CM | POA: Diagnosis not present

## 2023-12-30 DIAGNOSIS — E559 Vitamin D deficiency, unspecified: Secondary | ICD-10-CM | POA: Diagnosis not present

## 2024-01-05 DIAGNOSIS — R42 Dizziness and giddiness: Secondary | ICD-10-CM | POA: Diagnosis not present

## 2024-01-05 DIAGNOSIS — N184 Chronic kidney disease, stage 4 (severe): Secondary | ICD-10-CM | POA: Diagnosis not present

## 2024-01-05 DIAGNOSIS — Z794 Long term (current) use of insulin: Secondary | ICD-10-CM | POA: Diagnosis not present

## 2024-01-05 DIAGNOSIS — I25709 Atherosclerosis of coronary artery bypass graft(s), unspecified, with unspecified angina pectoris: Secondary | ICD-10-CM | POA: Diagnosis not present

## 2024-01-05 DIAGNOSIS — E1121 Type 2 diabetes mellitus with diabetic nephropathy: Secondary | ICD-10-CM | POA: Diagnosis not present

## 2024-01-05 DIAGNOSIS — I5042 Chronic combined systolic (congestive) and diastolic (congestive) heart failure: Secondary | ICD-10-CM | POA: Diagnosis not present

## 2024-01-09 ENCOUNTER — Ambulatory Visit: Admitting: Cardiovascular Disease

## 2024-01-11 DIAGNOSIS — R338 Other retention of urine: Secondary | ICD-10-CM | POA: Diagnosis not present

## 2024-01-19 DIAGNOSIS — L309 Dermatitis, unspecified: Secondary | ICD-10-CM | POA: Diagnosis not present

## 2024-01-19 DIAGNOSIS — L570 Actinic keratosis: Secondary | ICD-10-CM | POA: Diagnosis not present

## 2024-01-19 DIAGNOSIS — L538 Other specified erythematous conditions: Secondary | ICD-10-CM | POA: Diagnosis not present

## 2024-01-19 DIAGNOSIS — L820 Inflamed seborrheic keratosis: Secondary | ICD-10-CM | POA: Diagnosis not present

## 2024-01-19 DIAGNOSIS — C44619 Basal cell carcinoma of skin of left upper limb, including shoulder: Secondary | ICD-10-CM | POA: Diagnosis not present

## 2024-02-14 ENCOUNTER — Encounter: Payer: Self-pay | Admitting: Podiatry

## 2024-02-14 ENCOUNTER — Ambulatory Visit (INDEPENDENT_AMBULATORY_CARE_PROVIDER_SITE_OTHER): Admitting: Podiatry

## 2024-02-14 VITALS — Ht 72.0 in | Wt 162.8 lb

## 2024-02-14 DIAGNOSIS — M79674 Pain in right toe(s): Secondary | ICD-10-CM

## 2024-02-14 DIAGNOSIS — M79675 Pain in left toe(s): Secondary | ICD-10-CM

## 2024-02-14 DIAGNOSIS — D689 Coagulation defect, unspecified: Secondary | ICD-10-CM

## 2024-02-14 DIAGNOSIS — B351 Tinea unguium: Secondary | ICD-10-CM | POA: Diagnosis not present

## 2024-02-14 DIAGNOSIS — L84 Corns and callosities: Secondary | ICD-10-CM | POA: Diagnosis not present

## 2024-02-14 DIAGNOSIS — E118 Type 2 diabetes mellitus with unspecified complications: Secondary | ICD-10-CM

## 2024-02-14 NOTE — Progress Notes (Signed)
 This patient returns to my office for at risk foot care.  This patient requires this care by a professional since this patient will be at risk due to having diabetes type 2, CKD and chronic insufficiensy.  Patient is taking eliquiss.  This patient is unable to cut nails himself since the patient cannot reach his nails.These nails are painful walking and wearing shoes.  Patient has painful callus left forefoot.  This patient presents for at risk foot care today.  General Appearance  Alert, conversant and in no acute stress.  Vascular  Dorsalis pedis and posterior tibial  pulses are palpable  bilaterally.  Capillary return is within normal limits  bilaterally. Temperature is within normal limits  bilaterally.  Neurologic  Senn-Weinstein monofilament wire test diminished   bilaterally. Muscle power within normal limits bilaterally.  Nails Thick disfigured discolored nails with subungual debris  from hallux to fifth toes bilaterally. No evidence of bacterial infection or drainage bilaterally.  Orthopedic  No limitations of motion  feet .  No crepitus or effusions noted.  Hammer toes second  B/L.  MCJ DJD  B/L. HAV  B/L.  Skin  normotropic skin with no porokeratosis noted bilaterally.  No signs of infections or ulcers noted.  Hemorrhagic callus  ulcer 1st MPJ left foot.   Onychomycosis  Pain in right toes  Pain in left toes  left foot.  Consent was obtained for treatment procedures.   Mechanical debridement of nails 1-5  bilaterally performed with a nail nipper.  Filed with dremel without incident.Padding dispensed for diabetic insole.     Return office visit  5 weeks                Told patient to return for periodic foot care and evaluation due to potential at risk complications.   Cordella Bold DPM

## 2024-02-29 ENCOUNTER — Other Ambulatory Visit: Payer: Self-pay | Admitting: Nurse Practitioner

## 2024-02-29 DIAGNOSIS — I480 Paroxysmal atrial fibrillation: Secondary | ICD-10-CM

## 2024-03-14 ENCOUNTER — Other Ambulatory Visit: Payer: Self-pay | Admitting: Cardiovascular Disease

## 2024-03-14 NOTE — Telephone Encounter (Signed)
Patient daughter calling to check status of refill

## 2024-03-14 NOTE — Telephone Encounter (Signed)
 Patient daughter calling to follow up with refill

## 2024-03-19 ENCOUNTER — Emergency Department (HOSPITAL_COMMUNITY)

## 2024-03-19 ENCOUNTER — Encounter (HOSPITAL_COMMUNITY): Payer: Self-pay | Admitting: Emergency Medicine

## 2024-03-19 ENCOUNTER — Other Ambulatory Visit: Payer: Self-pay

## 2024-03-19 ENCOUNTER — Emergency Department (HOSPITAL_COMMUNITY)
Admission: EM | Admit: 2024-03-19 | Discharge: 2024-04-15 | Disposition: E | Attending: Emergency Medicine | Admitting: Emergency Medicine

## 2024-03-19 DIAGNOSIS — I469 Cardiac arrest, cause unspecified: Secondary | ICD-10-CM | POA: Insufficient documentation

## 2024-03-19 DIAGNOSIS — Z794 Long term (current) use of insulin: Secondary | ICD-10-CM | POA: Insufficient documentation

## 2024-03-19 DIAGNOSIS — Z7901 Long term (current) use of anticoagulants: Secondary | ICD-10-CM | POA: Insufficient documentation

## 2024-03-19 LAB — CBC
HCT: 40.1 % (ref 39.0–52.0)
Hemoglobin: 13.1 g/dL (ref 13.0–17.0)
MCH: 30.8 pg (ref 26.0–34.0)
MCHC: 32.7 g/dL (ref 30.0–36.0)
MCV: 94.4 fL (ref 80.0–100.0)
Platelets: 302 10*3/uL (ref 150–400)
RBC: 4.25 MIL/uL (ref 4.22–5.81)
RDW: 12 % (ref 11.5–15.5)
WBC: 15.4 10*3/uL — ABNORMAL HIGH (ref 4.0–10.5)
nRBC: 0 % (ref 0.0–0.2)

## 2024-03-19 LAB — BASIC METABOLIC PANEL WITH GFR
Anion gap: 22 — ABNORMAL HIGH (ref 5–15)
BUN: 71 mg/dL — ABNORMAL HIGH (ref 8–23)
CO2: 15 mmol/L — ABNORMAL LOW (ref 22–32)
Calcium: 10.3 mg/dL (ref 8.9–10.3)
Chloride: 104 mmol/L (ref 98–111)
Creatinine, Ser: 3.66 mg/dL — ABNORMAL HIGH (ref 0.61–1.24)
GFR, Estimated: 15 mL/min — ABNORMAL LOW
Glucose, Bld: 237 mg/dL — ABNORMAL HIGH (ref 70–99)
Potassium: 4.2 mmol/L (ref 3.5–5.1)
Sodium: 140 mmol/L (ref 135–145)

## 2024-03-19 LAB — I-STAT CHEM 8, ED
BUN: 72 mg/dL — ABNORMAL HIGH (ref 8–23)
Calcium, Ion: 1.13 mmol/L — ABNORMAL LOW (ref 1.15–1.40)
Chloride: 108 mmol/L (ref 98–111)
Creatinine, Ser: 4.3 mg/dL — ABNORMAL HIGH (ref 0.61–1.24)
Glucose, Bld: 234 mg/dL — ABNORMAL HIGH (ref 70–99)
HCT: 38 % — ABNORMAL LOW (ref 39.0–52.0)
Hemoglobin: 12.9 g/dL — ABNORMAL LOW (ref 13.0–17.0)
Potassium: 3.8 mmol/L (ref 3.5–5.1)
Sodium: 143 mmol/L (ref 135–145)
TCO2: 15 mmol/L — ABNORMAL LOW (ref 22–32)

## 2024-03-19 LAB — TROPONIN T, HIGH SENSITIVITY: Troponin T High Sensitivity: 330 ng/L (ref 0–19)

## 2024-03-19 LAB — I-STAT CG4 LACTIC ACID, ED: Lactic Acid, Venous: 8 mmol/L (ref 0.5–1.9)

## 2024-03-19 LAB — CBG MONITORING, ED: Glucose-Capillary: 185 mg/dL — ABNORMAL HIGH (ref 70–99)

## 2024-03-19 MED ORDER — ONDANSETRON HCL 4 MG/2ML IJ SOLN: 4.0000 mg | Freq: Once | INTRAMUSCULAR | Status: DC

## 2024-03-19 MED ORDER — ONDANSETRON 4 MG PO TBDP: 4.0000 mg | ORAL_TABLET | Freq: Once | ORAL | Status: DC

## 2024-03-19 MED ORDER — LIDOCAINE HCL (CARDIAC) PF 100 MG/5ML IV SOSY
PREFILLED_SYRINGE | INTRAVENOUS | Status: DC | PRN
  Administered 2024-03-19: 100 mg via INTRAVENOUS

## 2024-03-19 MED ORDER — MAGNESIUM SULFATE 50 % IJ SOLN
INTRAMUSCULAR | Status: DC | PRN
  Administered 2024-03-19: 2 g via INTRAVENOUS

## 2024-03-19 MED ORDER — EPINEPHRINE 1 MG/10ML IV SOSY
PREFILLED_SYRINGE | INTRAVENOUS | Status: DC | PRN
  Administered 2024-03-19 (×4): 1 mg via INTRAVENOUS

## 2024-03-19 MED ORDER — AMIODARONE IV BOLUS ONLY 150 MG/100ML
INTRAVENOUS | Status: DC | PRN
  Administered 2024-03-19: 150 mg via INTRAVENOUS
  Administered 2024-03-19: 300 mg via INTRAVENOUS

## 2024-03-20 ENCOUNTER — Ambulatory Visit: Admitting: Podiatry

## 2024-04-15 NOTE — ED Triage Notes (Signed)
 Pt c/o indigestion and SOBx3wks. PT states the sx have gotten worse over the last couple of days.

## 2024-04-15 NOTE — ED Triage Notes (Signed)
 Pt reports I have heartburn. Per family pt has had chest pain x 3 days. Pt has been taking TUMS with minimal to no relief.

## 2024-04-15 NOTE — ED Notes (Signed)
 I found pt in bathroom and triage yelling for help. Pt had walked in there from triage room 4. I advised pt to sit down and I went and got the recliner and brought to him. Once back in bathroom pt was sitting on toilet and stop responding and presented with the left sided gaze. I yelled for help and Alan, RN came to assit. We attempted to get pt in recliner but was unsuccessful. Ozell, RN came to help and [place], RN from purple. Once in recliner pt was laid back and ABC checked. Pt had agonal resp with pulse present. Pt did respond to sternal rub. After about 1 min pt started gazing to the left.Charge nurse called and we rolled pt back to room 28. Once in room pt became apneic and modeled. Pulse checked and had none. CPR started immeteally and coded button pulled.

## 2024-04-15 NOTE — ED Provider Triage Note (Signed)
 Emergency Medicine Provider Triage Evaluation Note  Nathan [name] , a 89 y.o. male  was evaluated in triage.  Pt complains of nausea and diffuse abdominal pain for the past couple days.  He believes symptoms are secondary to indigestion and has been taking Rolaids without relief.  Patient began vomiting profusely during exam.  Patient is tachypneic.  Per daughter patient was recently sick with a URI.  Denies diarrhea or fevers.  Patient with extensive cardiac history, CKD, and type 2 diabetes.  Review of Systems  Positive: N/V, abdominal pain Negative: Diarrhea, fevers  Physical Exam  BP 132/84 (BP Location: Right Arm)   Pulse (!) 106   Resp (!) 22   SpO2 100%  Gen:   Awake, no distress   Resp:  Normal effort with clear lung sounds throughout MSK:   Moves extremities without difficulty  Other:  Diffuse abdominal pain with palpation.  Medical Decision Making  Medically screening exam initiated at 1:58 PM.  Appropriate orders placed.  Nathan [name] was informed that the remainder of the evaluation will be completed by another provider, this initial triage assessment does not replace that evaluation, and the importance of remaining in the ED until their evaluation is complete.   Rosina Almarie LABOR, PA-C 2024-04-12 1400

## 2024-04-15 DEATH — deceased
